# Patient Record
Sex: Female | Born: 1935 | Race: White | Hispanic: No | Marital: Married | Smoking: Former smoker
Health system: Southern US, Community
[De-identification: ages and names within clinical notes are randomized; demographics above are authoritative.]

## PROBLEM LIST (undated history)

## (undated) DIAGNOSIS — I10 Essential (primary) hypertension: Secondary | ICD-10-CM

## (undated) DIAGNOSIS — H269 Unspecified cataract: Secondary | ICD-10-CM

## (undated) DIAGNOSIS — I4891 Unspecified atrial fibrillation: Secondary | ICD-10-CM

## (undated) DIAGNOSIS — Z8543 Personal history of malignant neoplasm of ovary: Secondary | ICD-10-CM

## (undated) DIAGNOSIS — E079 Disorder of thyroid, unspecified: Secondary | ICD-10-CM

## (undated) DIAGNOSIS — K56600 Partial intestinal obstruction, unspecified as to cause: Secondary | ICD-10-CM

## (undated) DIAGNOSIS — I509 Heart failure, unspecified: Secondary | ICD-10-CM

## (undated) DIAGNOSIS — I2510 Atherosclerotic heart disease of native coronary artery without angina pectoris: Secondary | ICD-10-CM

## (undated) DIAGNOSIS — Z95810 Presence of automatic (implantable) cardiac defibrillator: Secondary | ICD-10-CM

## (undated) DIAGNOSIS — K635 Polyp of colon: Secondary | ICD-10-CM

## (undated) DIAGNOSIS — I493 Ventricular premature depolarization: Secondary | ICD-10-CM

## (undated) DIAGNOSIS — M1990 Unspecified osteoarthritis, unspecified site: Secondary | ICD-10-CM

## (undated) DIAGNOSIS — M810 Age-related osteoporosis without current pathological fracture: Secondary | ICD-10-CM

## (undated) DIAGNOSIS — K648 Other hemorrhoids: Secondary | ICD-10-CM

## (undated) DIAGNOSIS — Z87440 Personal history of urinary (tract) infections: Secondary | ICD-10-CM

## (undated) DIAGNOSIS — K219 Gastro-esophageal reflux disease without esophagitis: Secondary | ICD-10-CM

## (undated) DIAGNOSIS — E785 Hyperlipidemia, unspecified: Secondary | ICD-10-CM

## (undated) DIAGNOSIS — C189 Malignant neoplasm of colon, unspecified: Secondary | ICD-10-CM

## (undated) DIAGNOSIS — E119 Type 2 diabetes mellitus without complications: Secondary | ICD-10-CM

## (undated) DIAGNOSIS — I428 Other cardiomyopathies: Secondary | ICD-10-CM

## (undated) HISTORY — DX: Personal history of urinary (tract) infections: Z87.440

## (undated) HISTORY — DX: Ventricular premature depolarization: I49.3

## (undated) HISTORY — DX: Disorder of thyroid, unspecified: E07.9

## (undated) HISTORY — DX: Partial intestinal obstruction, unspecified as to cause: K56.600

## (undated) HISTORY — DX: Type 2 diabetes mellitus without complications: E11.9

## (undated) HISTORY — DX: Hyperlipidemia, unspecified: E78.5

## (undated) HISTORY — DX: Age-related osteoporosis without current pathological fracture: M81.0

## (undated) HISTORY — DX: Unspecified cataract: H26.9

## (undated) HISTORY — PX: FRACTURE SURGERY: SHX138

## (undated) HISTORY — DX: Personal history of malignant neoplasm of ovary: Z85.43

## (undated) HISTORY — DX: Other cardiomyopathies: I42.8

## (undated) HISTORY — DX: Unspecified atrial fibrillation: I48.91

## (undated) HISTORY — DX: Other hemorrhoids: K64.8

## (undated) HISTORY — DX: Atherosclerotic heart disease of native coronary artery without angina pectoris: I25.10

## (undated) HISTORY — DX: Polyp of colon: K63.5

## (undated) HISTORY — DX: Essential (primary) hypertension: I10

---
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
## 1959-04-19 HISTORY — PX: OOPHORECTOMY: SHX86

## 1977-04-18 HISTORY — PX: ABDOMINAL HYSTERECTOMY: SHX81

## 1978-04-18 HISTORY — PX: LAPAROTOMY: SHX154

## 1983-04-19 DIAGNOSIS — Z8543 Personal history of malignant neoplasm of ovary: Secondary | ICD-10-CM

## 1983-04-19 HISTORY — DX: Personal history of malignant neoplasm of ovary: Z85.43

## 1983-04-19 HISTORY — PX: OVARY SURGERY: SHX727

## 1983-04-19 HISTORY — PX: SMALL INTESTINE SURGERY: SHX150

## 1987-04-19 HISTORY — PX: LAPAROTOMY: SHX154

## 1999-06-28 ENCOUNTER — Encounter: Admission: RE | Admit: 1999-06-28 | Discharge: 1999-07-15 | Payer: Self-pay | Admitting: Internal Medicine

## 2000-03-02 ENCOUNTER — Ambulatory Visit (HOSPITAL_COMMUNITY): Admission: RE | Admit: 2000-03-02 | Discharge: 2000-03-03 | Payer: Self-pay | Admitting: Cardiology

## 2000-04-25 ENCOUNTER — Encounter: Admission: RE | Admit: 2000-04-25 | Discharge: 2000-04-25 | Payer: Self-pay | Admitting: General Surgery

## 2000-04-25 ENCOUNTER — Ambulatory Visit (HOSPITAL_BASED_OUTPATIENT_CLINIC_OR_DEPARTMENT_OTHER): Admission: RE | Admit: 2000-04-25 | Discharge: 2000-04-25 | Payer: Self-pay | Admitting: General Surgery

## 2000-04-25 ENCOUNTER — Encounter: Payer: Self-pay | Admitting: General Surgery

## 2000-05-24 ENCOUNTER — Encounter: Payer: Self-pay | Admitting: Gastroenterology

## 2000-05-24 ENCOUNTER — Encounter (INDEPENDENT_AMBULATORY_CARE_PROVIDER_SITE_OTHER): Payer: Self-pay | Admitting: Specialist

## 2000-05-24 ENCOUNTER — Other Ambulatory Visit: Admission: RE | Admit: 2000-05-24 | Discharge: 2000-05-24 | Payer: Self-pay | Admitting: Gastroenterology

## 2000-11-16 ENCOUNTER — Encounter: Payer: Self-pay | Admitting: Gastroenterology

## 2000-11-16 ENCOUNTER — Encounter (INDEPENDENT_AMBULATORY_CARE_PROVIDER_SITE_OTHER): Payer: Self-pay | Admitting: Specialist

## 2000-11-16 ENCOUNTER — Other Ambulatory Visit: Admission: RE | Admit: 2000-11-16 | Discharge: 2000-11-16 | Payer: Self-pay | Admitting: Gastroenterology

## 2004-05-05 ENCOUNTER — Other Ambulatory Visit: Admission: RE | Admit: 2004-05-05 | Discharge: 2004-05-05 | Payer: Self-pay | Admitting: Internal Medicine

## 2005-04-18 HISTORY — PX: COLECTOMY: SHX59

## 2006-02-15 ENCOUNTER — Ambulatory Visit: Payer: Self-pay | Admitting: Gastroenterology

## 2006-03-08 ENCOUNTER — Ambulatory Visit: Payer: Self-pay | Admitting: Gastroenterology

## 2006-03-18 DIAGNOSIS — C189 Malignant neoplasm of colon, unspecified: Secondary | ICD-10-CM

## 2006-03-18 HISTORY — DX: Malignant neoplasm of colon, unspecified: C18.9

## 2006-03-22 ENCOUNTER — Encounter (INDEPENDENT_AMBULATORY_CARE_PROVIDER_SITE_OTHER): Payer: Self-pay | Admitting: Specialist

## 2006-03-22 ENCOUNTER — Ambulatory Visit: Payer: Self-pay | Admitting: Gastroenterology

## 2006-04-04 ENCOUNTER — Encounter: Admission: RE | Admit: 2006-04-04 | Discharge: 2006-04-04 | Payer: Self-pay | Admitting: General Surgery

## 2006-04-13 ENCOUNTER — Inpatient Hospital Stay (HOSPITAL_COMMUNITY): Admission: RE | Admit: 2006-04-13 | Discharge: 2006-04-18 | Payer: Self-pay | Admitting: General Surgery

## 2006-04-13 ENCOUNTER — Encounter (INDEPENDENT_AMBULATORY_CARE_PROVIDER_SITE_OTHER): Payer: Self-pay | Admitting: *Deleted

## 2006-04-13 ENCOUNTER — Encounter (INDEPENDENT_AMBULATORY_CARE_PROVIDER_SITE_OTHER): Payer: Self-pay | Admitting: Specialist

## 2006-04-17 ENCOUNTER — Ambulatory Visit: Payer: Self-pay | Admitting: Hematology and Oncology

## 2006-04-27 LAB — COMPREHENSIVE METABOLIC PANEL
ALT: 33 U/L (ref 0–35)
AST: 23 U/L (ref 0–37)
Albumin: 4.2 g/dL (ref 3.5–5.2)
Alkaline Phosphatase: 97 U/L (ref 39–117)
BUN: 11 mg/dL (ref 6–23)
CO2: 25 mEq/L (ref 19–32)
Calcium: 9.4 mg/dL (ref 8.4–10.5)
Chloride: 102 mEq/L (ref 96–112)
Creatinine, Ser: 0.84 mg/dL (ref 0.40–1.20)
Glucose, Bld: 93 mg/dL (ref 70–99)
Potassium: 4.1 mEq/L (ref 3.5–5.3)
Sodium: 141 mEq/L (ref 135–145)
Total Bilirubin: 0.5 mg/dL (ref 0.3–1.2)
Total Protein: 6.9 g/dL (ref 6.0–8.3)

## 2006-04-27 LAB — CBC WITH DIFFERENTIAL/PLATELET
BASO%: 0.7 % (ref 0.0–2.0)
Basophils Absolute: 0 10*3/uL (ref 0.0–0.1)
EOS%: 2.8 % (ref 0.0–7.0)
Eosinophils Absolute: 0.2 10*3/uL (ref 0.0–0.5)
HCT: 36 % (ref 34.8–46.6)
HGB: 12.3 g/dL (ref 11.6–15.9)
LYMPH%: 31.3 % (ref 14.0–48.0)
MCH: 30.3 pg (ref 26.0–34.0)
MCHC: 34.1 g/dL (ref 32.0–36.0)
MCV: 89 fL (ref 81.0–101.0)
MONO#: 0.5 10*3/uL (ref 0.1–0.9)
MONO%: 9 % (ref 0.0–13.0)
NEUT#: 3.1 10*3/uL (ref 1.5–6.5)
NEUT%: 56.2 % (ref 39.6–76.8)
Platelets: 343 10*3/uL (ref 145–400)
RBC: 4.04 10*6/uL (ref 3.70–5.32)
RDW: 12.6 % (ref 11.3–14.5)
WBC: 5.4 10*3/uL (ref 3.9–10.0)
lymph#: 1.7 10*3/uL (ref 0.9–3.3)

## 2006-04-27 LAB — CEA: CEA: 0.9 ng/mL (ref 0.0–5.0)

## 2006-05-01 ENCOUNTER — Ambulatory Visit (HOSPITAL_COMMUNITY): Admission: RE | Admit: 2006-05-01 | Discharge: 2006-05-01 | Payer: Self-pay | Admitting: Hematology and Oncology

## 2006-05-04 LAB — HM MAMMOGRAPHY

## 2006-05-09 ENCOUNTER — Ambulatory Visit: Payer: Self-pay | Admitting: Internal Medicine

## 2006-05-26 ENCOUNTER — Encounter (INDEPENDENT_AMBULATORY_CARE_PROVIDER_SITE_OTHER): Payer: Self-pay | Admitting: *Deleted

## 2006-06-21 ENCOUNTER — Ambulatory Visit: Payer: Self-pay | Admitting: Internal Medicine

## 2006-07-24 ENCOUNTER — Ambulatory Visit: Payer: Self-pay | Admitting: Hematology and Oncology

## 2006-07-27 LAB — CEA: CEA: 0.9 ng/mL (ref 0.0–5.0)

## 2006-07-27 LAB — CBC WITH DIFFERENTIAL/PLATELET
BASO%: 0.7 % (ref 0.0–2.0)
Basophils Absolute: 0 10*3/uL (ref 0.0–0.1)
EOS%: 3 % (ref 0.0–7.0)
Eosinophils Absolute: 0.2 10*3/uL (ref 0.0–0.5)
HCT: 37.5 % (ref 34.8–46.6)
HGB: 13.1 g/dL (ref 11.6–15.9)
LYMPH%: 34.2 % (ref 14.0–48.0)
MCH: 30.5 pg (ref 26.0–34.0)
MCHC: 35 g/dL (ref 32.0–36.0)
MCV: 87 fL (ref 81.0–101.0)
MONO#: 0.5 10*3/uL (ref 0.1–0.9)
MONO%: 8.9 % (ref 0.0–13.0)
NEUT#: 2.8 10*3/uL (ref 1.5–6.5)
NEUT%: 53.2 % (ref 39.6–76.8)
Platelets: 187 10*3/uL (ref 145–400)
RBC: 4.31 10*6/uL (ref 3.70–5.32)
RDW: 14 % (ref 11.3–14.5)
WBC: 5.3 10*3/uL (ref 3.9–10.0)
lymph#: 1.8 10*3/uL (ref 0.9–3.3)

## 2006-07-27 LAB — COMPREHENSIVE METABOLIC PANEL
ALT: 25 U/L (ref 0–35)
AST: 26 U/L (ref 0–37)
Albumin: 4.3 g/dL (ref 3.5–5.2)
Alkaline Phosphatase: 59 U/L (ref 39–117)
BUN: 15 mg/dL (ref 6–23)
CO2: 25 mEq/L (ref 19–32)
Calcium: 9.5 mg/dL (ref 8.4–10.5)
Chloride: 99 mEq/L (ref 96–112)
Creatinine, Ser: 0.88 mg/dL (ref 0.40–1.20)
Glucose, Bld: 97 mg/dL (ref 70–99)
Potassium: 4 mEq/L (ref 3.5–5.3)
Sodium: 136 mEq/L (ref 135–145)
Total Bilirubin: 0.6 mg/dL (ref 0.3–1.2)
Total Protein: 7.2 g/dL (ref 6.0–8.3)

## 2006-07-27 LAB — CA 125: CA 125: 6.9 U/mL (ref 0.0–30.2)

## 2006-11-20 ENCOUNTER — Ambulatory Visit: Payer: Self-pay | Admitting: Hematology and Oncology

## 2006-11-22 LAB — COMPREHENSIVE METABOLIC PANEL
ALT: 32 U/L (ref 0–35)
AST: 32 U/L (ref 0–37)
Albumin: 4.6 g/dL (ref 3.5–5.2)
Alkaline Phosphatase: 60 U/L (ref 39–117)
BUN: 15 mg/dL (ref 6–23)
CO2: 27 mEq/L (ref 19–32)
Calcium: 9.3 mg/dL (ref 8.4–10.5)
Chloride: 99 mEq/L (ref 96–112)
Creatinine, Ser: 0.99 mg/dL (ref 0.40–1.20)
Glucose, Bld: 137 mg/dL — ABNORMAL HIGH (ref 70–99)
Potassium: 3.7 mEq/L (ref 3.5–5.3)
Sodium: 138 mEq/L (ref 135–145)
Total Bilirubin: 0.6 mg/dL (ref 0.3–1.2)
Total Protein: 7.4 g/dL (ref 6.0–8.3)

## 2006-11-22 LAB — CBC WITH DIFFERENTIAL/PLATELET
BASO%: 0.3 % (ref 0.0–2.0)
Basophils Absolute: 0 10*3/uL (ref 0.0–0.1)
EOS%: 1.7 % (ref 0.0–7.0)
Eosinophils Absolute: 0.1 10*3/uL (ref 0.0–0.5)
HCT: 37.7 % (ref 34.8–46.6)
HGB: 13.5 g/dL (ref 11.6–15.9)
LYMPH%: 31 % (ref 14.0–48.0)
MCH: 31.8 pg (ref 26.0–34.0)
MCHC: 35.9 g/dL (ref 32.0–36.0)
MCV: 88.7 fL (ref 81.0–101.0)
MONO#: 0.4 10*3/uL (ref 0.1–0.9)
MONO%: 6.1 % (ref 0.0–13.0)
NEUT#: 4.2 10*3/uL (ref 1.5–6.5)
NEUT%: 60.9 % (ref 39.6–76.8)
Platelets: 196 10*3/uL (ref 145–400)
RBC: 4.25 10*6/uL (ref 3.70–5.32)
RDW: 12.6 % (ref 11.3–14.5)
WBC: 7 10*3/uL (ref 3.9–10.0)
lymph#: 2.2 10*3/uL (ref 0.9–3.3)

## 2006-11-22 LAB — CA 125: CA 125: 5.9 U/mL (ref 0.0–30.2)

## 2006-11-22 LAB — CEA: CEA: 0.5 ng/mL (ref 0.0–5.0)

## 2006-11-24 ENCOUNTER — Ambulatory Visit (HOSPITAL_COMMUNITY): Admission: RE | Admit: 2006-11-24 | Discharge: 2006-11-24 | Payer: Self-pay | Admitting: Hematology and Oncology

## 2007-02-28 ENCOUNTER — Ambulatory Visit: Payer: Self-pay | Admitting: Gastroenterology

## 2007-03-13 ENCOUNTER — Ambulatory Visit: Payer: Self-pay | Admitting: Gastroenterology

## 2007-03-13 ENCOUNTER — Encounter: Payer: Self-pay | Admitting: Internal Medicine

## 2007-03-21 ENCOUNTER — Ambulatory Visit: Payer: Self-pay | Admitting: Hematology and Oncology

## 2007-03-23 LAB — COMPREHENSIVE METABOLIC PANEL
ALT: 19 U/L (ref 0–35)
AST: 26 U/L (ref 0–37)
Albumin: 4.6 g/dL (ref 3.5–5.2)
Alkaline Phosphatase: 52 U/L (ref 39–117)
BUN: 14 mg/dL (ref 6–23)
CO2: 25 mEq/L (ref 19–32)
Calcium: 9.4 mg/dL (ref 8.4–10.5)
Chloride: 102 mEq/L (ref 96–112)
Creatinine, Ser: 0.88 mg/dL (ref 0.40–1.20)
Glucose, Bld: 100 mg/dL — ABNORMAL HIGH (ref 70–99)
Potassium: 4.4 mEq/L (ref 3.5–5.3)
Sodium: 140 mEq/L (ref 135–145)
Total Bilirubin: 0.7 mg/dL (ref 0.3–1.2)
Total Protein: 7.6 g/dL (ref 6.0–8.3)

## 2007-03-23 LAB — CBC WITH DIFFERENTIAL/PLATELET
BASO%: 0.5 % (ref 0.0–2.0)
Basophils Absolute: 0 10*3/uL (ref 0.0–0.1)
EOS%: 2.7 % (ref 0.0–7.0)
Eosinophils Absolute: 0.1 10*3/uL (ref 0.0–0.5)
HCT: 38.2 % (ref 34.8–46.6)
HGB: 13.3 g/dL (ref 11.6–15.9)
LYMPH%: 39.7 % (ref 14.0–48.0)
MCH: 31.4 pg (ref 26.0–34.0)
MCHC: 34.8 g/dL (ref 32.0–36.0)
MCV: 90.1 fL (ref 81.0–101.0)
MONO#: 0.5 10*3/uL (ref 0.1–0.9)
MONO%: 8.6 % (ref 0.0–13.0)
NEUT#: 2.6 10*3/uL (ref 1.5–6.5)
NEUT%: 48.5 % (ref 39.6–76.8)
Platelets: 209 10*3/uL (ref 145–400)
RBC: 4.24 10*6/uL (ref 3.70–5.32)
RDW: 13.1 % (ref 11.3–14.5)
WBC: 5.4 10*3/uL (ref 3.9–10.0)
lymph#: 2.1 10*3/uL (ref 0.9–3.3)

## 2007-03-23 LAB — CEA: CEA: 1 ng/mL (ref 0.0–5.0)

## 2007-03-23 LAB — CA 125: CA 125: 6.8 U/mL (ref 0.0–30.2)

## 2007-03-30 ENCOUNTER — Encounter: Payer: Self-pay | Admitting: Internal Medicine

## 2007-04-27 ENCOUNTER — Encounter: Payer: Self-pay | Admitting: Internal Medicine

## 2007-04-27 ENCOUNTER — Ambulatory Visit: Payer: Self-pay | Admitting: Internal Medicine

## 2007-04-27 DIAGNOSIS — N390 Urinary tract infection, site not specified: Secondary | ICD-10-CM | POA: Insufficient documentation

## 2007-04-27 DIAGNOSIS — I10 Essential (primary) hypertension: Secondary | ICD-10-CM | POA: Insufficient documentation

## 2007-04-27 DIAGNOSIS — Z85038 Personal history of other malignant neoplasm of large intestine: Secondary | ICD-10-CM | POA: Insufficient documentation

## 2007-04-27 DIAGNOSIS — E041 Nontoxic single thyroid nodule: Secondary | ICD-10-CM | POA: Insufficient documentation

## 2007-04-27 LAB — CONVERTED CEMR LAB
Free T4: 0.7 ng/dL (ref 0.6–1.6)
TSH: 2.98 microintl units/mL (ref 0.35–5.50)

## 2007-04-29 ENCOUNTER — Encounter: Payer: Self-pay | Admitting: Internal Medicine

## 2007-05-09 ENCOUNTER — Encounter: Payer: Self-pay | Admitting: Internal Medicine

## 2007-07-24 ENCOUNTER — Ambulatory Visit: Payer: Self-pay | Admitting: Hematology and Oncology

## 2007-07-26 LAB — COMPREHENSIVE METABOLIC PANEL
ALT: 26 U/L (ref 0–35)
AST: 24 U/L (ref 0–37)
Albumin: 4.6 g/dL (ref 3.5–5.2)
Alkaline Phosphatase: 51 U/L (ref 39–117)
BUN: 15 mg/dL (ref 6–23)
CO2: 26 mEq/L (ref 19–32)
Calcium: 9 mg/dL (ref 8.4–10.5)
Chloride: 99 mEq/L (ref 96–112)
Creatinine, Ser: 0.91 mg/dL (ref 0.40–1.20)
Glucose, Bld: 95 mg/dL (ref 70–99)
Potassium: 3.9 mEq/L (ref 3.5–5.3)
Sodium: 139 mEq/L (ref 135–145)
Total Bilirubin: 0.6 mg/dL (ref 0.3–1.2)
Total Protein: 7.1 g/dL (ref 6.0–8.3)

## 2007-07-26 LAB — CBC WITH DIFFERENTIAL/PLATELET
BASO%: 0.6 % (ref 0.0–2.0)
Basophils Absolute: 0 10*3/uL (ref 0.0–0.1)
EOS%: 2.7 % (ref 0.0–7.0)
Eosinophils Absolute: 0.2 10*3/uL (ref 0.0–0.5)
HCT: 40.8 % (ref 34.8–46.6)
HGB: 14 g/dL (ref 11.6–15.9)
LYMPH%: 40.1 % (ref 14.0–48.0)
MCH: 31 pg (ref 26.0–34.0)
MCHC: 34.3 g/dL (ref 32.0–36.0)
MCV: 90.3 fL (ref 81.0–101.0)
MONO#: 0.5 10*3/uL (ref 0.1–0.9)
MONO%: 8.9 % (ref 0.0–13.0)
NEUT#: 2.9 10*3/uL (ref 1.5–6.5)
NEUT%: 47.7 % (ref 39.6–76.8)
Platelets: 200 10*3/uL (ref 145–400)
RBC: 4.52 10*6/uL (ref 3.70–5.32)
RDW: 12.8 % (ref 11.3–14.5)
WBC: 6.2 10*3/uL (ref 3.9–10.0)
lymph#: 2.5 10*3/uL (ref 0.9–3.3)

## 2007-07-26 LAB — CEA: CEA: <0.5 ng/mL (ref 0.0–5.0)

## 2007-07-30 ENCOUNTER — Encounter (INDEPENDENT_AMBULATORY_CARE_PROVIDER_SITE_OTHER): Payer: Self-pay | Admitting: *Deleted

## 2007-07-30 ENCOUNTER — Ambulatory Visit (HOSPITAL_COMMUNITY): Admission: RE | Admit: 2007-07-30 | Discharge: 2007-07-30 | Payer: Self-pay | Admitting: Hematology and Oncology

## 2007-07-31 ENCOUNTER — Encounter: Payer: Self-pay | Admitting: Internal Medicine

## 2007-08-02 ENCOUNTER — Encounter: Payer: Self-pay | Admitting: Internal Medicine

## 2007-08-07 ENCOUNTER — Telehealth: Payer: Self-pay | Admitting: Internal Medicine

## 2007-09-17 ENCOUNTER — Ambulatory Visit: Payer: Self-pay | Admitting: Internal Medicine

## 2007-09-17 DIAGNOSIS — N951 Menopausal and female climacteric states: Secondary | ICD-10-CM | POA: Insufficient documentation

## 2007-09-24 ENCOUNTER — Encounter: Payer: Self-pay | Admitting: Internal Medicine

## 2007-09-24 ENCOUNTER — Ambulatory Visit: Payer: Self-pay | Admitting: Family Medicine

## 2007-10-15 ENCOUNTER — Encounter: Payer: Self-pay | Admitting: Internal Medicine

## 2007-11-27 ENCOUNTER — Ambulatory Visit: Payer: Self-pay | Admitting: Hematology and Oncology

## 2007-11-30 ENCOUNTER — Encounter: Payer: Self-pay | Admitting: Internal Medicine

## 2007-11-30 LAB — CBC WITH DIFFERENTIAL/PLATELET
BASO%: 0.4 % (ref 0.0–2.0)
Basophils Absolute: 0 10*3/uL (ref 0.0–0.1)
EOS%: 1.4 % (ref 0.0–7.0)
Eosinophils Absolute: 0.1 10*3/uL (ref 0.0–0.5)
HCT: 39.1 % (ref 34.8–46.6)
HGB: 13.3 g/dL (ref 11.6–15.9)
LYMPH%: 15 % (ref 14.0–48.0)
MCH: 31.2 pg (ref 26.0–34.0)
MCHC: 34.1 g/dL (ref 32.0–36.0)
MCV: 91.5 fL (ref 81.0–101.0)
MONO#: 0.6 10*3/uL (ref 0.1–0.9)
MONO%: 5.7 % (ref 0.0–13.0)
NEUT#: 7.6 10*3/uL — ABNORMAL HIGH (ref 1.5–6.5)
NEUT%: 77.5 % — ABNORMAL HIGH (ref 39.6–76.8)
Platelets: 185 10*3/uL (ref 145–400)
RBC: 4.28 10*6/uL (ref 3.70–5.32)
RDW: 13.1 % (ref 11.3–14.5)
WBC: 9.8 10*3/uL (ref 3.9–10.0)
lymph#: 1.5 10*3/uL (ref 0.9–3.3)

## 2007-12-01 LAB — COMPREHENSIVE METABOLIC PANEL
ALT: 33 U/L (ref 0–35)
AST: 39 U/L — ABNORMAL HIGH (ref 0–37)
Albumin: 4.5 g/dL (ref 3.5–5.2)
Alkaline Phosphatase: 63 U/L (ref 39–117)
BUN: 14 mg/dL (ref 6–23)
CO2: 24 mEq/L (ref 19–32)
Calcium: 9.2 mg/dL (ref 8.4–10.5)
Chloride: 98 mEq/L (ref 96–112)
Creatinine, Ser: 0.95 mg/dL (ref 0.40–1.20)
Glucose, Bld: 151 mg/dL — ABNORMAL HIGH (ref 70–99)
Potassium: 3.3 mEq/L — ABNORMAL LOW (ref 3.5–5.3)
Sodium: 139 mEq/L (ref 135–145)
Total Bilirubin: 0.5 mg/dL (ref 0.3–1.2)
Total Protein: 7.4 g/dL (ref 6.0–8.3)

## 2007-12-01 LAB — LACTATE DEHYDROGENASE: LDH: 155 U/L (ref 94–250)

## 2007-12-01 LAB — CA 125: CA 125: 5.7 U/mL (ref 0.0–30.2)

## 2007-12-01 LAB — CEA: CEA: 1.2 ng/mL (ref 0.0–5.0)

## 2007-12-04 ENCOUNTER — Encounter: Payer: Self-pay | Admitting: Internal Medicine

## 2007-12-05 ENCOUNTER — Encounter: Payer: Self-pay | Admitting: Gastroenterology

## 2008-01-29 ENCOUNTER — Encounter: Payer: Self-pay | Admitting: Internal Medicine

## 2008-05-12 ENCOUNTER — Encounter: Payer: Self-pay | Admitting: Internal Medicine

## 2008-05-13 ENCOUNTER — Ambulatory Visit: Payer: Self-pay | Admitting: Hematology and Oncology

## 2008-05-15 ENCOUNTER — Ambulatory Visit (HOSPITAL_COMMUNITY): Admission: RE | Admit: 2008-05-15 | Discharge: 2008-05-15 | Payer: Self-pay | Admitting: Hematology and Oncology

## 2008-05-15 ENCOUNTER — Encounter: Payer: Self-pay | Admitting: Gastroenterology

## 2008-05-15 LAB — CBC WITH DIFFERENTIAL/PLATELET
BASO%: 0.5 % (ref 0.0–2.0)
Basophils Absolute: 0 10*3/uL (ref 0.0–0.1)
EOS%: 3 % (ref 0.0–7.0)
Eosinophils Absolute: 0.2 10*3/uL (ref 0.0–0.5)
HCT: 37.7 % (ref 34.8–46.6)
HGB: 13 g/dL (ref 11.6–15.9)
LYMPH%: 26.6 % (ref 14.0–48.0)
MCH: 31.3 pg (ref 26.0–34.0)
MCHC: 34.5 g/dL (ref 32.0–36.0)
MCV: 90.8 fL (ref 81.0–101.0)
MONO#: 0.5 10*3/uL (ref 0.1–0.9)
MONO%: 7.3 % (ref 0.0–13.0)
NEUT#: 4.4 10*3/uL (ref 1.5–6.5)
NEUT%: 62.6 % (ref 39.6–76.8)
Platelets: 252 10*3/uL (ref 145–400)
RBC: 4.15 10*6/uL (ref 3.70–5.32)
RDW: 12.3 % (ref 11.3–14.5)
WBC: 7 10*3/uL (ref 3.9–10.0)
lymph#: 1.9 10*3/uL (ref 0.9–3.3)

## 2008-05-15 LAB — COMPREHENSIVE METABOLIC PANEL
ALT: 37 U/L — ABNORMAL HIGH (ref 0–35)
AST: 42 U/L — ABNORMAL HIGH (ref 0–37)
Albumin: 3.7 g/dL (ref 3.5–5.2)
Alkaline Phosphatase: 81 U/L (ref 39–117)
BUN: 13 mg/dL (ref 6–23)
CO2: 29 mEq/L (ref 19–32)
Calcium: 8.7 mg/dL (ref 8.4–10.5)
Chloride: 94 mEq/L — ABNORMAL LOW (ref 96–112)
Creatinine, Ser: 0.89 mg/dL (ref 0.40–1.20)
Glucose, Bld: 101 mg/dL — ABNORMAL HIGH (ref 70–99)
Potassium: 3.4 mEq/L — ABNORMAL LOW (ref 3.5–5.3)
Sodium: 133 mEq/L — ABNORMAL LOW (ref 135–145)
Total Bilirubin: 0.8 mg/dL (ref 0.3–1.2)
Total Protein: 7.3 g/dL (ref 6.0–8.3)

## 2008-05-15 LAB — CEA: CEA: 1 ng/mL (ref 0.0–5.0)

## 2008-05-16 LAB — CA 125: CA 125: 5.6 U/mL (ref 0.0–30.2)

## 2008-05-22 ENCOUNTER — Encounter: Payer: Self-pay | Admitting: Gastroenterology

## 2008-08-25 ENCOUNTER — Telehealth: Payer: Self-pay | Admitting: Gastroenterology

## 2008-08-26 ENCOUNTER — Ambulatory Visit: Payer: Self-pay | Admitting: Gastroenterology

## 2008-08-26 DIAGNOSIS — K648 Other hemorrhoids: Secondary | ICD-10-CM | POA: Insufficient documentation

## 2008-08-26 DIAGNOSIS — R197 Diarrhea, unspecified: Secondary | ICD-10-CM | POA: Insufficient documentation

## 2008-08-26 DIAGNOSIS — C189 Malignant neoplasm of colon, unspecified: Secondary | ICD-10-CM | POA: Insufficient documentation

## 2008-08-27 ENCOUNTER — Encounter: Payer: Self-pay | Admitting: Physician Assistant

## 2008-08-27 LAB — CONVERTED CEMR LAB
BUN: 9 mg/dL (ref 6–23)
Basophils Absolute: 0 10*3/uL (ref 0.0–0.1)
Basophils Relative: 0 % (ref 0.0–3.0)
CO2: 30 meq/L (ref 19–32)
Calcium: 8.8 mg/dL (ref 8.4–10.5)
Chloride: 100 meq/L (ref 96–112)
Creatinine, Ser: 0.7 mg/dL (ref 0.4–1.2)
Eosinophils Absolute: 0.1 10*3/uL (ref 0.0–0.7)
Eosinophils Relative: 1.1 % (ref 0.0–5.0)
GFR calc non Af Amer: 87.76 mL/min (ref >60)
Glucose, Bld: 93 mg/dL (ref 70–99)
HCT: 34.9 % — ABNORMAL LOW (ref 36.0–46.0)
Hemoglobin: 12.1 g/dL (ref 12.0–15.0)
Lymphocytes Relative: 13.4 % (ref 12.0–46.0)
Lymphs Abs: 1.3 10*3/uL (ref 0.7–4.0)
MCHC: 34.7 g/dL (ref 30.0–36.0)
MCV: 90.5 fL (ref 78.0–100.0)
Monocytes Absolute: 1 10*3/uL (ref 0.1–1.0)
Monocytes Relative: 10.1 % (ref 3.0–12.0)
Neutro Abs: 7.4 10*3/uL (ref 1.4–7.7)
Neutrophils Relative %: 75.4 % (ref 43.0–77.0)
Platelets: 218 10*3/uL (ref 150.0–400.0)
Potassium: 3.2 meq/L — ABNORMAL LOW (ref 3.5–5.1)
RBC: 3.85 M/uL — ABNORMAL LOW (ref 3.87–5.11)
RDW: 12.7 % (ref 11.5–14.6)
Sodium: 140 meq/L (ref 135–145)
WBC: 9.8 10*3/uL (ref 4.5–10.5)

## 2008-09-01 ENCOUNTER — Ambulatory Visit: Payer: Self-pay | Admitting: Physician Assistant

## 2008-09-01 LAB — CONVERTED CEMR LAB
Magnesium: 1.1 mg/dL — ABNORMAL LOW (ref 1.5–2.5)
Phosphorus: 2.6 mg/dL (ref 2.3–4.6)
Potassium: 2.4 meq/L — CL (ref 3.5–5.1)

## 2008-09-04 ENCOUNTER — Ambulatory Visit: Payer: Self-pay | Admitting: Physician Assistant

## 2008-09-10 ENCOUNTER — Telehealth: Payer: Self-pay | Admitting: Physician Assistant

## 2008-09-10 LAB — CONVERTED CEMR LAB
Magnesium: 1.3 mg/dL — ABNORMAL LOW (ref 1.5–2.5)
Potassium: 3.5 meq/L (ref 3.5–5.1)

## 2008-09-12 ENCOUNTER — Ambulatory Visit: Payer: Self-pay | Admitting: Gastroenterology

## 2008-09-12 ENCOUNTER — Ambulatory Visit: Payer: Self-pay | Admitting: Physician Assistant

## 2008-09-12 DIAGNOSIS — A09 Infectious gastroenteritis and colitis, unspecified: Secondary | ICD-10-CM | POA: Insufficient documentation

## 2008-09-12 DIAGNOSIS — C182 Malignant neoplasm of ascending colon: Secondary | ICD-10-CM | POA: Insufficient documentation

## 2008-09-12 DIAGNOSIS — R197 Diarrhea, unspecified: Secondary | ICD-10-CM | POA: Insufficient documentation

## 2008-09-12 LAB — CONVERTED CEMR LAB
Magnesium: 1.4 mg/dL — ABNORMAL LOW (ref 1.5–2.5)
Potassium: 3.8 meq/L (ref 3.5–5.1)

## 2008-10-15 ENCOUNTER — Ambulatory Visit: Payer: Self-pay | Admitting: Gastroenterology

## 2008-10-15 ENCOUNTER — Encounter: Payer: Self-pay | Admitting: Gastroenterology

## 2008-10-15 LAB — HM COLONOSCOPY

## 2008-10-17 ENCOUNTER — Encounter (INDEPENDENT_AMBULATORY_CARE_PROVIDER_SITE_OTHER): Payer: Self-pay

## 2008-10-21 ENCOUNTER — Encounter: Payer: Self-pay | Admitting: Gastroenterology

## 2008-11-11 ENCOUNTER — Telehealth: Payer: Self-pay | Admitting: Internal Medicine

## 2008-11-26 ENCOUNTER — Ambulatory Visit: Payer: Self-pay | Admitting: Gastroenterology

## 2008-11-26 DIAGNOSIS — K5289 Other specified noninfective gastroenteritis and colitis: Secondary | ICD-10-CM | POA: Insufficient documentation

## 2009-02-12 ENCOUNTER — Ambulatory Visit: Payer: Self-pay | Admitting: Hematology and Oncology

## 2009-02-17 ENCOUNTER — Ambulatory Visit (HOSPITAL_COMMUNITY): Admission: RE | Admit: 2009-02-17 | Discharge: 2009-02-17 | Payer: Self-pay | Admitting: Hematology and Oncology

## 2009-02-17 LAB — CA 125: CA 125: 7.7 U/mL (ref 0.0–30.2)

## 2009-02-17 LAB — CBC WITH DIFFERENTIAL/PLATELET
BASO%: 0.5 % (ref 0.0–2.0)
Basophils Absolute: 0 10*3/uL (ref 0.0–0.1)
EOS%: 3.5 % (ref 0.0–7.0)
Eosinophils Absolute: 0.2 10*3/uL (ref 0.0–0.5)
HCT: 39.6 % (ref 34.8–46.6)
HGB: 13.3 g/dL (ref 11.6–15.9)
LYMPH%: 30.9 % (ref 14.0–49.7)
MCH: 31.1 pg (ref 25.1–34.0)
MCHC: 33.6 g/dL (ref 31.5–36.0)
MCV: 92.7 fL (ref 79.5–101.0)
MONO#: 0.6 10*3/uL (ref 0.1–0.9)
MONO%: 10.3 % (ref 0.0–14.0)
NEUT#: 3.1 10*3/uL (ref 1.5–6.5)
NEUT%: 54.8 % (ref 38.4–76.8)
Platelets: 198 10*3/uL (ref 145–400)
RBC: 4.28 10*6/uL (ref 3.70–5.45)
RDW: 13.4 % (ref 11.2–14.5)
WBC: 5.7 10*3/uL (ref 3.9–10.3)
lymph#: 1.8 10*3/uL (ref 0.9–3.3)

## 2009-02-17 LAB — COMPREHENSIVE METABOLIC PANEL
ALT: 25 U/L (ref 0–35)
AST: 36 U/L (ref 0–37)
Albumin: 4.1 g/dL (ref 3.5–5.2)
Alkaline Phosphatase: 60 U/L (ref 39–117)
BUN: 12 mg/dL (ref 6–23)
CO2: 27 mEq/L (ref 19–32)
Calcium: 9.9 mg/dL (ref 8.4–10.5)
Chloride: 97 mEq/L (ref 96–112)
Creatinine, Ser: 0.87 mg/dL (ref 0.40–1.20)
Glucose, Bld: 107 mg/dL — ABNORMAL HIGH (ref 70–99)
Potassium: 4.6 mEq/L (ref 3.5–5.3)
Sodium: 137 mEq/L (ref 135–145)
Total Bilirubin: 0.8 mg/dL (ref 0.3–1.2)
Total Protein: 7.3 g/dL (ref 6.0–8.3)

## 2009-02-17 LAB — CEA: CEA: 1.1 ng/mL (ref 0.0–5.0)

## 2009-02-19 ENCOUNTER — Encounter: Payer: Self-pay | Admitting: Internal Medicine

## 2009-03-26 ENCOUNTER — Encounter: Payer: Self-pay | Admitting: Gastroenterology

## 2009-05-14 ENCOUNTER — Encounter: Payer: Self-pay | Admitting: Internal Medicine

## 2009-11-12 ENCOUNTER — Ambulatory Visit: Payer: Self-pay | Admitting: Hematology and Oncology

## 2009-11-16 LAB — CBC WITH DIFFERENTIAL/PLATELET
BASO%: 0.6 % (ref 0.0–2.0)
Basophils Absolute: 0 10*3/uL (ref 0.0–0.1)
EOS%: 1.9 % (ref 0.0–7.0)
Eosinophils Absolute: 0.1 10*3/uL (ref 0.0–0.5)
HCT: 38.6 % (ref 34.8–46.6)
HGB: 13.4 g/dL (ref 11.6–15.9)
LYMPH%: 33.9 % (ref 14.0–49.7)
MCH: 31.2 pg (ref 25.1–34.0)
MCHC: 34.7 g/dL (ref 31.5–36.0)
MCV: 90 fL (ref 79.5–101.0)
MONO#: 0.4 10*3/uL (ref 0.1–0.9)
MONO%: 7.7 % (ref 0.0–14.0)
NEUT#: 3.2 10*3/uL (ref 1.5–6.5)
NEUT%: 55.9 % (ref 38.4–76.8)
Platelets: 192 10*3/uL (ref 145–400)
RBC: 4.29 10*6/uL (ref 3.70–5.45)
RDW: 12.6 % (ref 11.2–14.5)
WBC: 5.7 10*3/uL (ref 3.9–10.3)
lymph#: 1.9 10*3/uL (ref 0.9–3.3)

## 2009-11-16 LAB — COMPREHENSIVE METABOLIC PANEL WITH GFR
ALT: 27 U/L (ref 0–35)
AST: 28 U/L (ref 0–37)
Albumin: 4.7 g/dL (ref 3.5–5.2)
Alkaline Phosphatase: 63 U/L (ref 39–117)
BUN: 16 mg/dL (ref 6–23)
CO2: 25 meq/L (ref 19–32)
Calcium: 9.2 mg/dL (ref 8.4–10.5)
Chloride: 98 meq/L (ref 96–112)
Creatinine, Ser: 0.95 mg/dL (ref 0.40–1.20)
Glucose, Bld: 101 mg/dL — ABNORMAL HIGH (ref 70–99)
Potassium: 4.2 meq/L (ref 3.5–5.3)
Sodium: 139 meq/L (ref 135–145)
Total Bilirubin: 0.5 mg/dL (ref 0.3–1.2)
Total Protein: 7.4 g/dL (ref 6.0–8.3)

## 2009-11-16 LAB — CA 125: CA 125: 6.9 U/mL (ref 0.0–30.2)

## 2009-11-16 LAB — CEA: CEA: 1 ng/mL (ref 0.0–5.0)

## 2009-11-19 ENCOUNTER — Encounter: Payer: Self-pay | Admitting: Internal Medicine

## 2010-01-19 ENCOUNTER — Encounter: Payer: Self-pay | Admitting: Internal Medicine

## 2010-01-19 ENCOUNTER — Ambulatory Visit: Payer: Self-pay | Admitting: Internal Medicine

## 2010-02-15 ENCOUNTER — Encounter: Payer: Self-pay | Admitting: Internal Medicine

## 2010-05-07 ENCOUNTER — Other Ambulatory Visit: Payer: Self-pay | Admitting: Hematology and Oncology

## 2010-05-07 DIAGNOSIS — C189 Malignant neoplasm of colon, unspecified: Secondary | ICD-10-CM

## 2010-05-18 NOTE — Procedures (Signed)
Summary: COLON   Colonoscopy  Procedure date:  03/22/2006  Findings:      Location:  [facility].  Results: Colon Cancer.      Results: Hemorrhoids.       Procedures Next Due Date:    Colonoscopy: 03/2008 Patient Name: Cynthia Frank, Cynthia Frank. MRN:  Procedure Procedures: Colonoscopy CPT: [id number].    with biopsy. CPT: Q5068410.    with Hot Biopsy(s)CPT: Z451292.    with polypectomy. CPT: A3573898.    with Subm injection of [place] ink, CPT: [phone]  Personnel: Endoscopist: Venita Lick. Russella Dar, MD, Clementeen Graham.  Exam Location: Exam performed in Outpatient Clinic. Outpatient  Patient Consent: Procedure, Alternatives, Risks and Benefits discussed, consent obtained, from patient. Consent was obtained by the RN.  Indications  Increased Risk Screening: Personal history of ovarian cancer.  History  Current Medications: Patient is not currently taking Coumadin.  Pre-Exam Physical: Performed Mar 22, 2006. Cardio-pulmonary exam, Rectal exam, HEENT exam , Abdominal exam, Mental status exam WNL.  Comments: Pt. history reviewed/updated, physical exam performed prior to initiation of sedation?Yes Exam Exam: Extent of exam reached: Cecum, extent intended: Cecum.  The cecum was identified by appendiceal orifice and IC valve. Time to Cecum: 00:10: 01. Time for Withdrawl: 00:32:18. Colon retroflexion performed. ASA Classification: II. Tolerance: excellent.  Monitoring: Pulse and BP monitoring, Oximetry used. Supplemental O2 given.  Colon Prep Used MoviPrep for colon prep. Prep results: excellent.  Sedation Meds: Patient assessed and found to be appropriate for moderate (conscious) sedation. Fentanyl 125 mcg. given IV. Versed 12 given IV.  Findings MULTIPLE POLYPS: Ascending Colon. minimum size 4 mm, maximum size 8 mm. Procedure:  hot biopsy, removed, Polyp retrieved, 2 polyps Polyps sent to pathology. ICD9: Liver Metastasis: 197.7.  POLYP: Ascending Colon, Maximum size: 10 mm. sessile  polyp. Procedure:  snare with cautery, The polyp was removed piece meal. removed, retrieved, Polyp sent to pathology. ICD9: Colon Polyps: 211.3.  NORMAL EXAM: Ascending Colon.  - Injection: Ascending Colon. 4 ccs. Outcome: successful. Comments: [place] ink tattoo 5 cm distal to the 20mm polyp not removed.  - POLYP: Ascending Colon, Maximum size: 20 mm. sessile polyp. not removed, ICD9: Colon Polyps: 211.3.  TUMOR: in Ascending Colon. Length: 2 cm, Biopsy/Tumor taken. ICD9: Neoplasia, Malignant, Suspected: 199.1. Comments: Ulcerated tumor in prox asc colon.  NORMAL EXAM: Cecum.  NORMAL EXAM: Hepatic Flexure to Sigmoid Colon.  HEMORRHOIDS: Internal. Size: Small. Not bleeding. Not thrombosed. ICD9: Hemorrhoids, Internal: 455.0.   Assessment  Diagnoses: 199.1: Neoplasia, Malignant, Suspected.  211.3: Colon Polyps.  455.0: Hemorrhoids, Internal.   Events  Unplanned Interventions: No intervention was required.  Unplanned Events: There were no complications. Plans  Post Exam Instructions: Post sedation instructions given.  Medication Plan: Await pathology.  Patient Education: Patient given standard instructions for: Polyps. Hemorrhoids.  Disposition: After procedure patient sent to recovery. After recovery patient sent home.  Scheduling/Referral: Colonoscopy, to [facility] T. Russella Dar, MD, [facility], around Mar 22, 2008.  Surgery, R hemicolectomy, next available appt,    This report was created from the original endoscopy report, which was reviewed and signed by the above listed endoscopist.   CC: Rosalyn Gess. Norins, MD  FINAL DIAGNOSIS    ***MICROSCOPIC EXAMINATION AND DIAGNOSIS***    1. COLON, ASCENDING, ULCERATIVE LESION, BIOPSIES: POORLY   DIFFERENTIATED ADENOCARCINOMA.    2. COLON, ASCENDING POLYPS: HYPERPLASTIC POLYPS WITH SUBMUCOSAL   ADIPOSE TISSUE. SEE COMMENT. NO ADENOMATOUS CHANGE OR   MALIGNANCY IDENTIFIED.    COMMENT   1. The biopsy fragments are  uninvolved by  poorly differentiated   carcinoma which focally has features of adenocarcinoma. One of   the fragments has a microscopic area which may represent a small   portion of pre-existing adenoma which would suggest primary   colonic poorly differentiated adenocarcinoma.    2. The ascending polyps show large portions of benign adipose   tissue in the submucosal fragments which may represent portions   of lipoma. The overlying mucosa shows mucosa changes.    Dr. Clelia Croft has reviewed part 1 and agrees.    Results called to [facility] in Dr. Russella Dar' s office on 03/24/06.   Report faxed to Dr. Russella Dar' s office on 03/24/06. (JDP:gt,   03/24/06)    gdt   Date Reported: 03/24/2006 Beulah Gandy. Luisa Hart, MD   *** Electronically Signed Out By JDP ***    Clinical information   1. Biopsy of ulcerative lesion; screening (gt)    specimen(s) obtained   1: Colon, biopsy, ascending   2: Colon, polyp(s), ascending 4 mm to 10 mm    Gross Description   1. Received in formalin are tan, soft tissue fragments that are   submitted in toto. Number: four. Size: 0.1 to 0.3 cm.   Block Summary: Toto one cassette.    2. Received in formalin are four irregular to polypoid shaped   portions of soft reddish-tan tissue that vary from 0.4 to 1 cm in   greatest dimension. Block Summary: The two larger specimens are   bisected and all material is submitted in one cassette. (BM:caf   03/23/06)    cf/

## 2010-05-18 NOTE — Letter (Signed)
   [facility] [address] [place], [place]  [address] Phone: [phone]      February 15, 2010   Cynthia Frank [address] [place], [place] [address]  RE:  LAB RESULTS  Dear  Ms. Fazzini,  The following is an interpretation of your most recent lab tests.  Please take note of any instructions provided or changes to medications that have resulted from your lab work.  bone density study reveals osteopenia at the spine and hips.  You need to have at leat 1200mg  daily of calcium and 1000 international units of Vitamin D along with regular weight bearing exercise, such as walking. Please feel free to come in to see me to discuss the possibility of medical therapy given your multiple medical problems.   Sincerely Yours,    Jacques Navy MD

## 2010-05-18 NOTE — Letter (Signed)
[facility]   Imported By: Sherian Rein 12/02/2009 11:21:57  _____________________________________________________________________  External Attachment:    Type:   Image     Comment:   External Document

## 2010-08-03 ENCOUNTER — Ambulatory Visit (INDEPENDENT_AMBULATORY_CARE_PROVIDER_SITE_OTHER): Payer: Medicare Other | Admitting: Internal Medicine

## 2010-08-03 VITALS — BP 110/72 | HR 74 | Temp 97.9°F | Wt 156.0 lb

## 2010-08-03 DIAGNOSIS — M8580 Other specified disorders of bone density and structure, unspecified site: Secondary | ICD-10-CM

## 2010-08-03 DIAGNOSIS — M899 Disorder of bone, unspecified: Secondary | ICD-10-CM

## 2010-08-03 NOTE — Progress Notes (Signed)
  Subjective:    Patient ID: Cynthia Frank, female    DOB: Mar 06, 1936, 75 y.o.   MRN: [id number]  HPI Cynthia Frank presnets for follow-up. In the interal since her last visit in fall '11 she has been diagnosed with PAF and is on beta-blockers and warfarin under the direction of Armanda Magic MD. She has also been diagnosed with a cardiomyopathy. She reports that her symptoms are transient and not symptomatic.  We discuss her DXA scan with T-scores of -1.7 for femoral necks, -1.1. For spine. No real need for medication.   She had a bout of colitis - treated by Dr. Russella Dar. Question of C. Diff - not confirmed. She did have inflammatory bowel disease. She was told to take immodium as needed.   She is followed for h/o colon cancer and is scheduled for follow-up CT scan. She is 3 years out.  She had a mammogram in January.   Past Medical History  Diagnosis Date  . Hx: UTI (urinary tract infection)   . Nonischemic cardiomyopathy     chemo related  . History of ovarian cancer   . Hypertension   . History of colon cancer   . Hx of adenomatous colonic polyps   . Partial bowel obstruction   . Internal hemorrhoid    Past Surgical History  Procedure Date  . Abdominal hysterectomy   . Oophorectomy     '61  . Tonsillectomy     '44  . Resection of right ovarian cancer '84, repeat '86   . Laparotomy     '80 adhesions  . Resection due to partial bowel obstruction     1985  . Colon surgery     for colon cancer in 2007  . Laparotomy for takedown of intestinal obstruction secondary to adhesions     1989   Family History  Problem Relation Age of Onset  . Cancer Mother     uterine cancer  . Cancer Father     prostate  . Heart disease Father    PMH, FamHx and SocHx reviewed for any changes and relevance.     Review of Systems Review of Systems  Constitutional:  Negative for fever, chills, activity change and unexpected weight change.  HENT:  Negative for hearing loss, ear pain,  congestion, neck stiffness and postnasal drip.   Eyes: Negative for pain, discharge and visual disturbance.  Respiratory: Negative for chest tightness and wheezing.   Cardiovascular: Negative for chest pain and palpitations.       [No decreased exercise tolerance Gastrointestinal: [No change in bowel habit. No bloating or gas. No reflux or indigestion Genitourinary: Negative for urgency, frequency, flank pain and difficulty urinating.  Musculoskeletal: Negative for myalgias, back pain, arthralgias and gait problem.  Neurological: Negative for dizziness, tremors, weakness and headaches.  Hematological: Negative for adenopathy.  Psychiatric/Behavioral: Negative for behavioral problems and dysphoric mood.       Objective:   Physical Exam WNWD white female in no distress HEENT - normal Chest Clear Cor RRR        Assessment & Plan:  1. Abnormal bone density study - patient with mild osteopenia. No indication for medical therapy.  Plan - calcum 1200mg  daily; Vit D [phone] iu daily  (More than 50% of a 20 min visit spent on counseling and education.)

## 2010-08-13 ENCOUNTER — Telehealth: Payer: Self-pay | Admitting: *Deleted

## 2010-08-13 MED ORDER — ZOLPIDEM TARTRATE 10 MG PO TABS: 10.0000 mg | ORAL_TABLET | Freq: Every evening | ORAL | Status: DC | PRN

## 2010-08-13 NOTE — Telephone Encounter (Signed)
OK for zolpidem 5mg  po qhs, # 30, sig 1 po qhs prn , 5 refills

## 2010-08-13 NOTE — Telephone Encounter (Signed)
Called in previous dose - OK PER MD

## 2010-08-13 NOTE — Telephone Encounter (Signed)
Patient requesting refill of ambien - called into [facility] (Says MD told her he was going to call this in at last ov)

## 2010-08-16 ENCOUNTER — Telehealth: Payer: Self-pay | Admitting: *Deleted

## 2010-08-16 NOTE — Telephone Encounter (Signed)
Patient requesting to try voltaren gel for her knee pain. She is currently on coumadin and spoke w/pharmacist for suggestions. Please advise.

## 2010-08-17 ENCOUNTER — Other Ambulatory Visit: Payer: Self-pay | Admitting: Hematology and Oncology

## 2010-08-17 ENCOUNTER — Encounter (HOSPITAL_BASED_OUTPATIENT_CLINIC_OR_DEPARTMENT_OTHER): Payer: Medicare Other | Admitting: Hematology and Oncology

## 2010-08-17 ENCOUNTER — Encounter (HOSPITAL_COMMUNITY): Payer: Self-pay

## 2010-08-17 ENCOUNTER — Ambulatory Visit (HOSPITAL_COMMUNITY)
Admission: RE | Admit: 2010-08-17 | Discharge: 2010-08-17 | Disposition: A | Discharge status: Home / Self Care | Payer: Medicare Other | Source: Ambulatory Visit | Attending: Hematology and Oncology | Admitting: Hematology and Oncology

## 2010-08-17 DIAGNOSIS — C189 Malignant neoplasm of colon, unspecified: Secondary | ICD-10-CM

## 2010-08-17 DIAGNOSIS — Z90710 Acquired absence of both cervix and uterus: Secondary | ICD-10-CM | POA: Insufficient documentation

## 2010-08-17 DIAGNOSIS — Z8543 Personal history of malignant neoplasm of ovary: Secondary | ICD-10-CM

## 2010-08-17 DIAGNOSIS — Z980 Intestinal bypass and anastomosis status: Secondary | ICD-10-CM | POA: Insufficient documentation

## 2010-08-17 DIAGNOSIS — J984 Other disorders of lung: Secondary | ICD-10-CM | POA: Insufficient documentation

## 2010-08-17 DIAGNOSIS — Z85038 Personal history of other malignant neoplasm of large intestine: Secondary | ICD-10-CM | POA: Insufficient documentation

## 2010-08-17 HISTORY — DX: Malignant neoplasm of colon, unspecified: C18.9

## 2010-08-17 LAB — CBC WITH DIFFERENTIAL/PLATELET
BASO%: 0.6 % (ref 0.0–2.0)
Basophils Absolute: 0 10*3/uL (ref 0.0–0.1)
EOS%: 1.8 % (ref 0.0–7.0)
Eosinophils Absolute: 0.1 10*3/uL (ref 0.0–0.5)
HCT: 37 % (ref 34.8–46.6)
HGB: 12.6 g/dL (ref 11.6–15.9)
LYMPH%: 31.8 % (ref 14.0–49.7)
MCH: 30.8 pg (ref 25.1–34.0)
MCHC: 34.1 g/dL (ref 31.5–36.0)
MCV: 90.1 fL (ref 79.5–101.0)
MONO#: 0.5 10*3/uL (ref 0.1–0.9)
MONO%: 8.2 % (ref 0.0–14.0)
NEUT#: 3.5 10*3/uL (ref 1.5–6.5)
NEUT%: 57.6 % (ref 38.4–76.8)
Platelets: 155 10*3/uL (ref 145–400)
RBC: 4.11 10*6/uL (ref 3.70–5.45)
RDW: 13.2 % (ref 11.2–14.5)
WBC: 6.1 10*3/uL (ref 3.9–10.3)
lymph#: 2 10*3/uL (ref 0.9–3.3)

## 2010-08-17 LAB — CEA: CEA: 0.9 ng/mL (ref 0.0–5.0)

## 2010-08-17 LAB — CMP (CANCER CENTER ONLY)
ALT(SGPT): 47 U/L (ref 10–47)
AST: 45 U/L — ABNORMAL HIGH (ref 11–38)
Albumin: 3.9 g/dL (ref 3.3–5.5)
Alkaline Phosphatase: 56 U/L (ref 26–84)
BUN, Bld: 15 mg/dL (ref 7–22)
CO2: 28 mEq/L (ref 18–33)
Calcium: 9.1 mg/dL (ref 8.0–10.3)
Chloride: 93 mEq/L — ABNORMAL LOW (ref 98–108)
Creat: 0.7 mg/dl (ref 0.6–1.2)
Glucose, Bld: 107 mg/dL (ref 73–118)
Potassium: 4 mEq/L (ref 3.3–4.7)
Sodium: 141 mEq/L (ref 128–145)
Total Bilirubin: 0.6 mg/dl (ref 0.20–1.60)
Total Protein: 7.2 g/dL (ref 6.4–8.1)

## 2010-08-17 MED ORDER — IOHEXOL 300 MG/ML  SOLN
100.0000 mL | Freq: Once | INTRAMUSCULAR | Status: AC | PRN
  Administered 2010-08-17: 100 mL via INTRAVENOUS

## 2010-08-17 MED ORDER — DICLOFENAC SODIUM 1 % TD GEL: 1.0000 "application " | Freq: Four times a day (QID) | TRANSDERMAL | Status: DC

## 2010-08-17 NOTE — Telephone Encounter (Signed)
RX approved and sent in to pharmacy. Returned call to patient, line busy will try again later

## 2010-08-17 NOTE — Telephone Encounter (Signed)
Left vm for pt to check w/her pharm 

## 2010-08-17 NOTE — Telephone Encounter (Signed)
OK to try voltaren gel apply 4 times a day to the knee

## 2010-08-18 ENCOUNTER — Telehealth: Payer: Self-pay | Admitting: Internal Medicine

## 2010-08-18 NOTE — Telephone Encounter (Signed)
Forwarded to Dr. Norins for review. °

## 2010-08-19 ENCOUNTER — Encounter (HOSPITAL_BASED_OUTPATIENT_CLINIC_OR_DEPARTMENT_OTHER): Payer: Medicare Other | Admitting: Hematology and Oncology

## 2010-08-19 ENCOUNTER — Other Ambulatory Visit: Payer: Self-pay | Admitting: Hematology and Oncology

## 2010-08-19 DIAGNOSIS — C569 Malignant neoplasm of unspecified ovary: Secondary | ICD-10-CM

## 2010-08-19 DIAGNOSIS — C189 Malignant neoplasm of colon, unspecified: Secondary | ICD-10-CM

## 2010-08-24 ENCOUNTER — Encounter: Payer: Self-pay | Admitting: Internal Medicine

## 2010-09-03 NOTE — Op Note (Signed)
Cynthia Frank, Cynthia Frank            ACCOUNT NO.:  [id number]   MEDICAL RECORD NO.:  [id number]          PATIENT TYPE:  INP   LOCATION:  0001                         FACILITY:  [facility]   PHYSICIAN:  Angelia Mould. Derrell Lolling, M.D.DATE OF BIRTH:  03-07-1936   DATE OF PROCEDURE:  DATE OF DISCHARGE:                               OPERATIVE REPORT   PREOPERATIVE DIAGNOSIS:  Adenocarcinoma of the right colon.   POSTOPERATIVE DIAGNOSIS:  Adenocarcinoma of the right colon.   OPERATION PERFORMED:  Right colectomy.   SURGEON:  Dr. Claud Kelp   FIRST ASSISTANT:  Dr. Cyndia Bent   OPERATIVE INDICATIONS:  This is a 75 year old white female who had  oophorectomy for ovarian cancer in 1985 with chemotherapy under the  direction of Dr. Isabel Caprice.  She has no known recurrence to-date.  She underwent a screening colonoscopy recently.  Dr. Russella Dar describes a 2-  cm tumor in the ascending colon just above the ileocecal valve, which  was biopsied and showed adenocarcinoma.  She also had a polyp in the mid  ascending colon, which was not removed, and there was a polyp near the  hepatic flexure, which was removed, showing benign hyperplastic polyp.  She had a CT scan which shows no sign of any metastatic disease.  There  were a couple of tiny nodules in the base of the lungs which are  indeterminate and will require follow-up CT scanning in 6-12 months.  The patient has been advised of this and she has agreed to have a CT  scan in about six months.  She underwent a bowel prep at home, which  went well.  She is brought to operating room electively.   OPERATIVE FINDINGS:  The patient had moderate soft chronic adhesions to  the anterior abdominal wall and the small bowel loops to each other and  small bowel loops to the transverse colon mesentery.  All of these were  taken down without incident.  There was a small palpable mass just above  the ileocecal valve which was felt to be consistent with the  carcinoma.  There was a small palpable mass in the mid ascending colon and some  [place] ink there, consistent with the polyp that Dr. Russella Dar left behind.  There were no enlarged lymph nodes.  The pelvis was clean.  There was no  sign of any metastatic disease in the retroperitoneum, omentum or liver.  The gallbladder felt normal.   OPERATIVE TECHNIQUE:  Following the induction of general endotracheal  anesthesia, the patient was identified as to correct procedure and  correct patient.  Intravenous antibiotics were given prior to the  incision.  The abdomen was prepped and draped in sterile fashion.  The  Foley catheter had been inserted prior to prepping the abdomen.   A midline laparotomy was performed, going through the previous scar.  Dissection was carried down through the subcutaneous tissue and through  the midline fascia.  We entered the peritoneal space fairly easily in  the upper abdomen and then slowly opened the incision, taking down  adhesions as we went.  After about 10 minutes we had the  incision  completely open and all of the omentum and small bowel loops completely  dissected away from the undersurface without any problem.  We explored  the abdomen with findings as described above.  We had to spend about 30  minutes taking down small bowel adhesions in the pelvis and right lower  quadrant and under the transverse colon mesentery, until we had the  small bowel completely freed up.  We then mobilized the terminal ileum  and the right colon and hepatic flexure by dividing the lateral  peritoneal attachments.  Larger attachments were controlled with the  LigaSure device.  We transected the terminal ileum about 6 inches  proximal to the ileocecal valve.  There was one small 2-mm white nodule  in the small bowel mesentery consistent with some fat necrosis.  This  was marked with a suture and mentioned to Dr. Laureen Ochs.  The small bowel was  transected with the GIA stapling device.   We cleaned off the transverse  colon, preserving the left branch of the middle colic artery.  We  transected the colon with the GIA stapling device.  The mesentery was  then carefully cleaned off and divided.  Smaller vessels were controlled  with the LigaSure device and a larger right colic vessel was controlled  with 2-0 silk sutures.  This was doubly ligated.  We were very careful  to identify the structures in the retroperitoneum and the duodenum,  which were not injured.  The specimen was removed.  The specimen was  examined by Dr. Laureen Ochs.  He identified the tumor in the proximal ascending  colon and the polyp in the mid ascending colon and said that the margins  were widely negative.   Anastomosis was created between the terminal ileum and the mid  transverse colon with a GIA stapling device.  This was inspected for  bleeding and there was none.  The defect in the bowel wall was closed  with a TA-60 stapling device.   At this point we changed our instruments and gloves.  The anastomosis  was intact and looked good.  The bowel was pink and viable on both  sides.  A few extra sutures of 3-0 silk were placed to reinforce the  staple line at strategic points.  The mesentery was closed with  interrupted figure-of-eight sutures of 3-0 silk.   We then irrigated the abdomen and pelvis with about 3 liters of saline.  Irrigation fluid was clear.  There did not appear to be any active  bleeding.  The colon and remaining omentum were returned to their  anatomic positions.  The midline fascia was closed with a running suture  of #1 PDS.  The skin was closed with skin staples.  Clean bandages were  placed and the patient taken to the recovery room in stable condition.  Estimated blood loss was about 50 mL.  Complications were none.  Sponge,  needle and instrument counts were correct.      Angelia Mould. Derrell Lolling, M.D.  Electronically Signed    HMI/MEDQ  D:  04/13/2006  T:  04/13/2006  Job:   [id number]   cc:   Rosalyn Gess. Norins, MD  [address]  [place]  [place] [address]   Venita Lick. Russella Dar, MD, FACG  [address]  [place]  [place] [address]   Armanda Magic, M.D.  Fax: [phone]

## 2010-09-03 NOTE — Discharge Summary (Signed)
NAMEZIVAH, MAYR            ACCOUNT NO.:  [id number]   MEDICAL RECORD NO.:  [id number]         PATIENT TYPE:  LINP   LOCATION:                               FACILITY:  [facility]   PHYSICIAN:  Angelia Mould. Derrell Lolling, M.D.DATE OF BIRTH:  02-Sep-1935   DATE OF ADMISSION:  04/13/2006  DATE OF DISCHARGE:  04/18/2006                               DISCHARGE SUMMARY   FINAL DIAGNOSIS:  1. Adenocarcinoma of the right colon.  Pathologic stage T3N0MX.  2. 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 Armanda Magic, stable.  3. Hypertension.  4. Status post oophorectomy for ovarian cancer in 1985.   OPERATIONS PERFORMED:  Right colectomy; date 04/13/2006.   HISTORY:  This is a 75 year old white female who is basically  asymptomatic from a gastrointestinal standpoint.  She had a screening  colonoscopy which revealed an approximately 2 cm tumor in the ascending  colon.  This was ulcerated.  Biopsy showed adenocarcinoma.  She also had  a polyp in the mid ascending colon which was not removed.  She  apparently also had a polyp in the distal ascending colon which was  removed and showed hyperplastic polyp.  I was asked to see her as a  Research scientist (medical) as an outpatient.  I saw her on 03/31/2006.  We needed her  past history of Adriamycin chemotherapy for ovarian cancer in 1985, no  known recurrence.  She was followed by Dr. Isabel Caprice for that, here  in [place].  She was counseled regarding colon resection.  She  underwent bowel prep at home; and was admitted the morning of surgery.   PHYSICAL EXAM:  GENERAL:  A pleasant, healthy-appearing woman in no  distress.  VITAL SIGNS:  Blood pressure 139/89, pulse 78, height 5 feet  5-1/2 inches, weight 151 pounds.  NECK:  No adenopathy or mass.  LUNGS:  Clear to auscultation.  HEART: Regular rate and rhythm, no murmur.  No  peripheral edema.  BREASTS:  Not examined.  ABDOMEN:  Soft, flat,  nontender.  A long lower midline incision  that extended slightly to the  left of the umbilicus, no mass nor hernia.  Liver and spleen were not  enlarged.   HOSPITAL COURSE:  On the day of admission the patient was taken to the  operating room and underwent a right colectomy.  The surgery was  uneventful.  We saw no evidence of metastatic disease.  She had moderate  adhesions from her previous laparotomies.  It was noted on preoperative  CT that she has some indeterminate small lung nodules; and that this  would need to be evaluated by the medical oncologist, but that probably  she would need a follow up CT scan in 6 months; and she was advised of  that, and agreed to do that.   Postoperatively she did fairly well.  She began tolerating sips of clear  liquids on postoperative day #1, but was continued for another couple of  days until her ileus resolved.  Her pathology report showed that this  was a T3N0 tumor.  She was advised of  that.  We advanced her diet,  slowly, over the next few days.  She was ready for discharge on April 18, 2006.  At that time she was tolerating her diet, nausea had resolved;  she had had stools.  Her abdomen was soft and nontender, and the  incision looked good.   She was asked to follow up with me in the office in 1 week for staple  removal.  We will plan to refer her to an oncologist as an outpatient.  She thinks that she wants to stay in [facility] for oncology care.  Once  again, we discussed with her that there were some small indeterminate  nodules in her lungs on CT that would need follow up; and she is aware  that.Angelia Mould. Derrell Lolling, M.D.  Electronically Signed     HMI/MEDQ  D:  05/26/2006  T:  05/26/2006  Job:  [id number]   cc:   Venita Lick. Russella Dar, MD, FACG  [address]  [place]  [place] [address]   Rosalyn Gess. Norins, MD  [address]  [place]  [place] [address]   Armanda Magic, M.D.  Fax: [phone]   Lynett Fish, M.D.

## 2010-09-03 NOTE — Assessment & Plan Note (Signed)
[facility]                           GASTROENTEROLOGY OFFICE NOTE   Cynthia, Frank                   MRN:          [id number]  DATE:02/15/2006                            DOB:          12-18-1935    Cynthia Frank is a very nice 75 year old white female that I have seen in the  past. Her husband is a patient of mine. Her primary physician is Dr. Illene Frank. She underwent colonoscopy in August 2002 which showed hyperplastic  colon polyps and internal hemorrhoids. She has a personal history of ovarian  cancer diagnosed at age 57. No family history of colon cancer, colon polyps  or inflammatory bowel disease. She relates no change in bowel habits, change  in stool caliber, abdominal pain, rectal pain, diarrhea or constipation.   PAST MEDICAL HISTORY:  1. Hypertension.  2. Stage 3 ovarian cancer, 1985.  3. Cardiomyopathy likely secondary to Adriamycin  4. Status post hysterectomy in 1980  5. Status post unilateral oophorectomy for ovarian cancer in 1985  6. Status post second unilateral oophorectomy at age 89  7. Partial bowel obstruction secondary to adhesions requiring surgery in      1989   CURRENT MEDICATIONS:  Listed on the chart and have been reviewed.   MEDICATIONS ALLERGIES:  SULFA DRUGS.   SOCIAL HISTORY:  Per the handwritten form.   REVIEW OF SYSTEMS:  Per the handwritten form.   PHYSICAL EXAMINATION:  GENERAL:  Well-developed, well-nourished, white  female in no acute distress.  VITAL SIGNS:  Height 5 feet 5 inches, weight 155.6 pounds, blood pressure is  120/60, pulse 68 and regular.  HEENT:  Anicteric sclera, oropharynx clear.  CHEST:  Clear to auscultation bilaterally.  CARDIAC:  Regular rate and rhythm without murmurs appreciated.  ABDOMEN:  Soft, nontender, nondistended, normal active bowel sounds, no  palpable organomegaly, masses or hernias.  RECTAL:  Deferred until time of colonoscopy.  EXTREMITIES:  Without  clubbing, cyanosis or edema.  NEUROLOGIC:  Alert and oriented x3. Grossly nonfocal.   ASSESSMENT/PLAN:  1. Personal history of ovarian cancer at age 66. Rule out colorectal      neoplasms. The risks,      benefits, and alternatives to colonoscopy with possible biopsy and      possible polypectomy discussed with the patient and she consents to      proceed. This will be scheduled electively.     Cynthia Frank. Cynthia Dar, MD, [facility]  Electronically Signed    MTS/MedQ  DD: 03/06/2006  DT: 03/06/2006  Job #: [id number]

## 2010-09-03 NOTE — Assessment & Plan Note (Signed)
[facility]                           PRIMARY CARE OFFICE NOTE   AVERYANA, PILLARS                   MRN:          [id number]  DATE:06/21/2006                            DOB:          1936-03-18    Ms. Cranmer is a 75 year old woman who presents for followup evaluation  and exam.  She was last seen May 09, 2006 as a new patient.  Please  see that complete dictation.  This was reviewed with the patient in  detail with no significant additions or corrections, except to add  aspirin to her medication list, and her weight was corrected to 148  pounds.   In the interval since her last visit she has been doing well.   REVIEW OF SYSTEMS:  Negative for any constitutional, cardiovascular,  respiratory, GI, or GU problems.   PHYSICAL EXAMINATION:  Temperature was not taken, blood pressure was  120/90, pulse 72, weight 148.  GENERAL APPEARANCE:  A well-nourished, well-developed woman, looks her  stated age, in no acute distress.  HEENT EXAM:  Normocephalic, atraumatic.  EACs and TMs were unremarkable.  Oropharynx with native dentition in good repair.  No buccal or palate  lesions were noted.  Posterior pharynx was clear.  Conjunctivae and  sclerae were clear.  Patient has a small skin lesion on the lower eyelid  of the right.  Pupils are equal, round, and reactive to light and  accommodation.  Funduscopic exam was unremarkable.  NECK:  Supple without thyromegaly.  NODES:  No lymphadenopathy was noted in the cervical, supraclavicular,  or axillary regions.  CHEST:  No CVA tenderness.  LUNGS:  Clear to auscultation and percussion.  CARDIOVASCULAR:  2+ radial pulses, no JVD or carotid bruits.  She had a  quiet precordium with a regular rate and rhythm without murmurs, rubs,  or gallops.  BREAST EXAM:  Skin was normal, nipples without discharge, no fixed mass,  lesion, or abnormality was noted.  ABDOMEN:  Soft, no guarding, no rebound.  No  organosplenomegaly was  noted.  PELVIC EXAM:  Deferred to patient having had hysterectomy.  RECTAL EXAM:  Deferred to recent colonoscopy.  EXTREMITIES:  Without cyanosis, clubbing, edema or deformity.  NEUROLOGIC EXAM:  Nonfocal.   LABORATORY DATA:  Patient's laboratory, I do have March 29, 2006, at  [facility] with an LDL cholesterol of 119, total cholesterol of 191.  Patient had basic metabolic panel, December 31st, at [facility] with  a glucose of 118, potassium 3.6, creatinine was 0.7, complete blood  count from December 28th with a hemoglobin of 11.2 g, white count was  9,700 with a normal differential.  Patient had a CEA April 12, 2006,  that was negative at 0.5.  Patient had a CA125 that was normal at 11.   ASSESSMENT AND PLAN:  1. Hypertension.  Patient's blood pressure is adequately controlled on      her present medical regimen.  She will continue the same.  2. History of nonischemic cardiomyopathy.  Patient is stable and doing      well with no signs of decompensation.  She will continue on  her      present medication.  3. History of colon cancer.  Patient has made an excellent recovery      from her surgery and is doing very well.  4. Ovarian cancer.  Patient is a survivor and doing well.  5. Health maintenance.  Patient with a normal breast exam today.  She      did have a mammogram January 17th of 2008 which was normal.      Patient's colon, of course, was evaluated as part of her evaluation      for colon cancer.  She will have followup colonoscopy as      instructed.   In summary, this is a delightful patient who is medically stable at this  time.  She is now fully established as an ongoing, regular, primary care  patient.  She has asked to return to see me on an as-needed basis, or  for routine examination in 1 year.     Rosalyn Gess Norins, MD  Electronically Signed    MEN/MedQ  DD: 06/22/2006  DT: 06/22/2006  Job #: [id number]   cc:   Ms. Silverio Decamp

## 2010-09-03 NOTE — Assessment & Plan Note (Signed)
[facility]                           PRIMARY CARE OFFICE NOTE   [name], Frank                   MRN:          [id number]  DATE:05/09/2006                            DOB:          [birth year]    Cynthia Frank presents to establish for ongoing continuity of primary  care.   PAST MEDICAL HISTORY:   SURGICAL:  1. Tonsillectomy in 1944.  2. Left oophorectomy, 1961.  3. Hysterectomy to metrorrhagia in 1980.  4. Ovarian cancer with resection in 1985.  5. Laparotomy and recheck in 1986.  6. Laparotomy for takedown of intestinal obstruction secondary to      adhesions, 1989.  7. Colectomy secondary to adenocarcinoma, right colon, 2007.   MEDICAL ILLNESSES:  1. Measles as a child and otherwise fully immunized.  2. Ovarian cancer, status post debulking and chemotherapy with      Adriamycin and cisplatin and Cytoxan with no evidence of recurrent      malignancy.  3. Nonischemic cardiomyopathy secondary to Adriamycin with an EF of      40%.  4. Hypertension.  5. Colon cancer with adenocarcinoma of the right colon, status post      resection with end-to-end anastomosis with no need for      consolidation of chemotherapy or radiation therapy.  6. UTIs.  7. GYN:  Gravida 3, para 2, with 1 SAB.   PHYSICIAN ROSTER:  1. Dr. Armanda Magic, Cardiology.  2. Dr. Dalene Carrow, Oncology.  3. Dr. Russella Dar, GI.  4. Dr. Dione Booze, Ophthalmology.  5. Dr. Derrell Lolling, General Surgery.   CURRENT MEDICATIONS:  1. Toprol-XL 25 mg daily.  2. Altace 7.5 mg daily.  3. Lasix 20 mg daily.  4. Fish oil.  5. Vitamin C.  6. Calcium.  7. Multivitamins.   FAMILY HISTORY:  Positive for uterine cancer in her mother.  Father with  prostate cancer.  Father died of an MI at age 65.  Positive for  emotional illness in secondary kinship.  Positive for diabetes in  secondary kinship.   HABITS:  Alcohol:  Averaging 2 ounces per day.  Tobacco:  None, but she  has a 20-pack-year smoking  history and quit at age 8.   SOCIAL HISTORY:  The patient is a Buyer, retail in biology.  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  Married 43 years.  Two daughters, 3 grandchildren.  The patient does live independently.  Her husband is retired; he is in reasonably good health, but he does  have pulmonary issues and sees Dr. Francella Solian.   REVIEW OF SYSTEMS:  The patient has had a 7-pound weight loss after her  surgery.  She has had no fevers, sweats or chills.  She has had an eye  exam in the last 2 years.  No ENT complaints.  No cardiovascular  complaints and no limitations in her activities with her cardiomyopathy.  No respiratory complaints. The patient does have occasional heartburn.  She has a normal bowel habits.  No GU, musculoskeletal or dermatologic  complaints.   PHYSICAL EXAMINATION:  VITAL SIGNS:  Temperature was  97.4, blood  pressure 130/83, pulse 69.  Weight 168.  GENERAL APPEARANCE:  A well-nourished, well-developed woman in no acute  distress.  No further exam conducted with the patient having had recent  multiple examinations as part of her hospitalization, with last full  physical exam by Dr. Daphine Deutscher, June 30, 2005.   RECORD REVIEW:  The patient's chart from [facility] was reviewed in detail.  The patient did have a cardiac catheterization, March 02, 2000, with  no coronary artery disease, ER of 40%.  The patient did have a gated  Cardiolite exercise stress test, February 24, 2000, with no significant  chest discomfort or problems, with normal perfusion images.  The patient  did have an echocardiogram performed February 24, 2000 with an EF of  approximately 40% with no valvular disease.   The patient's surgical report is on the chart from recent surgery.   Last laboratory from May 30, 2005 revealed the patient to have a  cholesterol of 199, triglycerides 68, LDL 119, HDL was 69.  Path reports  on the chart from her colonoscopy,  polypectomy and surgery.  The patient  had chemistries, May 05, 2004, which revealed a normal blood sugar  of 94.  Renal function was normal.  CA125 dated May 07, 2004 was  normal at 6.9.  The patient's last Pap smear from the old records was  May 05, 2004 and this was a normal Pap smear.   Last mammogram from April 27, 2005 was a normal study with  recommendation for routine screening in 1 year.   Last bone density study on old records is from June 27, 2003, which  showed T score at the AP spine of -1.0, at the lateral spine of -1.5, at  the femoral neck at a -1.7; total hip had a -0.9.  This essentially was  stable in comparison to studies on 2003 and 2001.   ASSESSMENT AND PLAN:  1. Hypertension, well controlled.  2. Cardiomyopathy, stable, with followup with Dr. Armanda Magic, with      the patient doing very well with no limitations in activities.  3. Oncology:  The patient is a survivor of stage III ovarian cancer in      1985 with no evidence of recurrence.  4. The patient has had colostomy for adenocarcinoma of the colon with      clean margins and no metastasis to lymph nodes with 42 nodes      surveyed.  No indication for adjunctive therapy.  5. Health maintenance:  The patient is current with mammography.  She      is current with Pap smears and will be due for a Pap smear in 2008.      She is currently with colonoscopy with findings as above with      surgical history.   I have asked the patient to return to see me for full physical exam  including breast exam in March or April, which would keep her on  schedule with previous examinations.     Rosalyn Gess Norins, MD  Electronically Signed    MEN/MedQ  DD: 05/11/2006  DT: 05/11/2006  Job #: [id number]   cc:   [address], [place], [place] [address] Ms. Nicholes Rough, M.D.

## 2010-09-20 ENCOUNTER — Encounter: Payer: Self-pay | Admitting: Gastroenterology

## 2010-10-13 ENCOUNTER — Encounter: Payer: Self-pay | Admitting: Gastroenterology

## 2010-10-13 ENCOUNTER — Ambulatory Visit (INDEPENDENT_AMBULATORY_CARE_PROVIDER_SITE_OTHER): Payer: Medicare Other | Admitting: Gastroenterology

## 2010-10-13 VITALS — BP 120/80 | HR 72 | Ht 65.5 in | Wt 154.6 lb

## 2010-10-13 DIAGNOSIS — Z7901 Long term (current) use of anticoagulants: Secondary | ICD-10-CM

## 2010-10-13 DIAGNOSIS — Z85038 Personal history of other malignant neoplasm of large intestine: Secondary | ICD-10-CM

## 2010-10-13 MED ORDER — PEG-KCL-NACL-NASULF-NA ASC-C 100 G PO SOLR: 1.0000 | Freq: Once | ORAL | Status: DC

## 2010-10-13 NOTE — Patient Instructions (Addendum)
You have been scheduled for a Colonoscopy. Separate instructions given. Pick up your prep kit from you pharmacy before your procedure. We will contact you regarding your coumadin clearance before your procedure. cc: Armanda Magic, MD       Arlan Organ, MD

## 2010-10-13 NOTE — Progress Notes (Signed)
History of Present Illness: This is a 75 year old female who returns for followup of T3, N0 colon cancer diagnosed in 03/2006. In addition she had a nonspecific colitis diagnosed at her last colonoscopy, possibly due to NSAIDs. Her diarrhea gradually resolved over the course of several months after discontinuing aspirin and NSAIDs. She states she has had no diarrhea since about September of last year.  She was diagnosed with atrial fibrillation and placed on Coumadin earlier this calendar year she is followed by Dr. Mayford Knife. I reviewed her recent chest abdomen and pelvis CT scan performed on May 12. No abdominal findings. There was slight interval increase in nodular pleural thickening in the right lung and stable, small pleural-based nodules in the left lung.  She denies abdominal pain, change in bowel habits, melena, hematochezia.  Current Medications, Allergies, Past Medical History, Past Surgical History, Family History and Social History were reviewed in [employer] record.  Physical Exam: General: Well developed , well nourished, no acute distress Head: Normocephalic and atraumatic Eyes:  sclerae anicteric, EOMI Ears: Normal auditory acuity Mouth: No deformity or lesions Lungs: Clear throughout to auscultation Heart: Regular rate and rhythm; no murmurs, rubs or bruits Abdomen: Soft, non tender and non distended. No masses, hepatosplenomegaly or hernias noted. Normal Bowel sounds Rectal: Deferred colonoscopy Musculoskeletal: Symmetrical with no gross deformities  Pulses:  Normal pulses noted Extremities: No clubbing, cyanosis, edema or deformities noted Neurological: Alert oriented x 4, grossly nonfocal Psychological:  Alert and cooperative. Normal mood and affect  Assessment and Recommendations:  1. Personal history of T3, N0 colon cancer diagnosed in December 2007. She is due for surveillance colonoscopy. The risks, benefits, and alternatives to colonoscopy with  possible biopsy and possible polypectomy were discussed with the patient and they consent to proceed. Ongoing follow up with Dr. Dalene Carrow.  2. Atrial fibrillation on chronic Coumadin anticoagulation. The risks, benefits and alternatives to 3-5 day hold Coumadin were discussed with the patient she consents to proceed. Will obtain clearance from Dr. Mayford Knife. If it is not advisable to hold her Coumadin we will  perform her colonoscopy while on Coumadin. The patient understands that large polyps would likely not be removed if she is anticoagulated during the colonoscopy .   3. Nonspecific colitis on last colonoscopy. Appears to be self-limited. Possible aspirin and NSAID related.

## 2010-10-15 ENCOUNTER — Telehealth: Payer: Self-pay

## 2010-10-15 NOTE — Telephone Encounter (Signed)
Left a message for patient to return my call regarding warfarin clearance before procedure.

## 2010-10-18 NOTE — Telephone Encounter (Signed)
Spoke with patient and informed her to come off warfarin 3 days prior to procedure. Pt agreed and verbalized understanding.

## 2010-11-02 ENCOUNTER — Encounter (HOSPITAL_COMMUNITY): Payer: Medicare Other

## 2010-11-03 ENCOUNTER — Ambulatory Visit (HOSPITAL_COMMUNITY)
Admission: RE | Admit: 2010-11-03 | Discharge: 2010-11-03 | Disposition: A | Discharge status: Home / Self Care | Payer: Medicare Other | Source: Ambulatory Visit | Attending: Cardiology | Admitting: Cardiology

## 2010-11-03 DIAGNOSIS — Z79899 Other long term (current) drug therapy: Secondary | ICD-10-CM | POA: Insufficient documentation

## 2010-11-03 DIAGNOSIS — I4891 Unspecified atrial fibrillation: Secondary | ICD-10-CM | POA: Insufficient documentation

## 2010-11-08 ENCOUNTER — Inpatient Hospital Stay (HOSPITAL_COMMUNITY)
Admission: AD | Admit: 2010-11-08 | Discharge: 2010-11-11 | DRG: 310 | Disposition: A | Discharge status: Home / Self Care | Payer: Medicare Other | Source: Ambulatory Visit | Attending: Cardiology | Admitting: Cardiology

## 2010-11-08 ENCOUNTER — Inpatient Hospital Stay (HOSPITAL_COMMUNITY): Payer: Medicare Other

## 2010-11-08 DIAGNOSIS — I059 Rheumatic mitral valve disease, unspecified: Secondary | ICD-10-CM | POA: Diagnosis present

## 2010-11-08 DIAGNOSIS — Z8543 Personal history of malignant neoplasm of ovary: Secondary | ICD-10-CM

## 2010-11-08 DIAGNOSIS — Z7982 Long term (current) use of aspirin: Secondary | ICD-10-CM

## 2010-11-08 DIAGNOSIS — I428 Other cardiomyopathies: Secondary | ICD-10-CM | POA: Diagnosis present

## 2010-11-08 DIAGNOSIS — E876 Hypokalemia: Secondary | ICD-10-CM | POA: Diagnosis present

## 2010-11-08 DIAGNOSIS — T451X5A Adverse effect of antineoplastic and immunosuppressive drugs, initial encounter: Secondary | ICD-10-CM | POA: Diagnosis present

## 2010-11-08 DIAGNOSIS — I4891 Unspecified atrial fibrillation: Principal | ICD-10-CM | POA: Diagnosis present

## 2010-11-08 DIAGNOSIS — I4949 Other premature depolarization: Secondary | ICD-10-CM | POA: Diagnosis present

## 2010-11-08 DIAGNOSIS — I2510 Atherosclerotic heart disease of native coronary artery without angina pectoris: Secondary | ICD-10-CM | POA: Diagnosis present

## 2010-11-08 DIAGNOSIS — Z7901 Long term (current) use of anticoagulants: Secondary | ICD-10-CM

## 2010-11-08 DIAGNOSIS — Z9221 Personal history of antineoplastic chemotherapy: Secondary | ICD-10-CM

## 2010-11-08 DIAGNOSIS — Z85038 Personal history of other malignant neoplasm of large intestine: Secondary | ICD-10-CM

## 2010-11-08 DIAGNOSIS — Z79899 Other long term (current) drug therapy: Secondary | ICD-10-CM

## 2010-11-08 DIAGNOSIS — I10 Essential (primary) hypertension: Secondary | ICD-10-CM | POA: Diagnosis present

## 2010-11-08 LAB — CBC
HCT: 37.6 % (ref 36.0–46.0)
Hemoglobin: 13 g/dL (ref 12.0–15.0)
MCH: 30.2 pg (ref 26.0–34.0)
MCHC: 34.6 g/dL (ref 30.0–36.0)
MCV: 87.4 fL (ref 78.0–100.0)
Platelets: 153 10*3/uL (ref 150–400)
RBC: 4.3 MIL/uL (ref 3.87–5.11)
RDW: 13.2 % (ref 11.5–15.5)
WBC: 5.8 10*3/uL (ref 4.0–10.5)

## 2010-11-08 LAB — COMPREHENSIVE METABOLIC PANEL
ALT: 32 U/L (ref 0–35)
AST: 32 U/L (ref 0–37)
Albumin: 3.5 g/dL (ref 3.5–5.2)
Alkaline Phosphatase: 63 U/L (ref 39–117)
BUN: 17 mg/dL (ref 6–23)
CO2: 28 mEq/L (ref 19–32)
Calcium: 8.9 mg/dL (ref 8.4–10.5)
Chloride: 99 mEq/L (ref 96–112)
Creatinine, Ser: 0.83 mg/dL (ref 0.50–1.10)
GFR calc Af Amer: >60 mL/min (ref >60)
GFR calc non Af Amer: >60 mL/min (ref >60)
Glucose, Bld: 87 mg/dL (ref 70–99)
Potassium: 3 mEq/L — ABNORMAL LOW (ref 3.5–5.1)
Sodium: 139 mEq/L (ref 135–145)
Total Bilirubin: 0.4 mg/dL (ref 0.3–1.2)
Total Protein: 6.9 g/dL (ref 6.0–8.3)

## 2010-11-08 LAB — DIFFERENTIAL
Basophils Absolute: 0 10*3/uL (ref 0.0–0.1)
Basophils Relative: 0 % (ref 0–1)
Eosinophils Absolute: 0.1 10*3/uL (ref 0.0–0.7)
Eosinophils Relative: 2 % (ref 0–5)
Lymphocytes Relative: 32 % (ref 12–46)
Lymphs Abs: 1.9 10*3/uL (ref 0.7–4.0)
Monocytes Absolute: 0.6 10*3/uL (ref 0.1–1.0)
Monocytes Relative: 10 % (ref 3–12)
Neutro Abs: 3.2 10*3/uL (ref 1.7–7.7)
Neutrophils Relative %: 55 % (ref 43–77)

## 2010-11-08 LAB — PROTIME-INR
INR: 2.45 — ABNORMAL HIGH (ref 0.00–1.49)
Prothrombin Time: 27 seconds — ABNORMAL HIGH (ref 11.6–15.2)

## 2010-11-08 LAB — TSH: TSH: 1.328 u[IU]/mL (ref 0.350–4.500)

## 2010-11-09 LAB — PROTIME-INR
INR: 2.5 — ABNORMAL HIGH (ref 0.00–1.49)
Prothrombin Time: 27.4 seconds — ABNORMAL HIGH (ref 11.6–15.2)

## 2010-11-10 LAB — BASIC METABOLIC PANEL
BUN: 14 mg/dL (ref 6–23)
CO2: 27 mEq/L (ref 19–32)
Calcium: 9 mg/dL (ref 8.4–10.5)
Chloride: 97 mEq/L (ref 96–112)
Creatinine, Ser: 0.85 mg/dL (ref 0.50–1.10)
GFR calc Af Amer: >60 mL/min (ref >60)
GFR calc non Af Amer: >60 mL/min (ref >60)
Glucose, Bld: 87 mg/dL (ref 70–99)
Potassium: 2.9 mEq/L — ABNORMAL LOW (ref 3.5–5.1)
Sodium: 138 mEq/L (ref 135–145)

## 2010-11-10 LAB — CBC
HCT: 39.2 % (ref 36.0–46.0)
Hemoglobin: 13.4 g/dL (ref 12.0–15.0)
MCH: 29.8 pg (ref 26.0–34.0)
MCHC: 34.2 g/dL (ref 30.0–36.0)
MCV: 87.1 fL (ref 78.0–100.0)
Platelets: 151 10*3/uL (ref 150–400)
RBC: 4.5 MIL/uL (ref 3.87–5.11)
RDW: 13.1 % (ref 11.5–15.5)
WBC: 5.9 10*3/uL (ref 4.0–10.5)

## 2010-11-10 LAB — PROTIME-INR
INR: 2.66 — ABNORMAL HIGH (ref 0.00–1.49)
Prothrombin Time: 28.8 seconds — ABNORMAL HIGH (ref 11.6–15.2)

## 2010-11-11 LAB — BASIC METABOLIC PANEL
BUN: 16 mg/dL (ref 6–23)
BUN: 14 mg/dL (ref 6–23)
CO2: 27 mEq/L (ref 19–32)
CO2: 28 mEq/L (ref 19–32)
Calcium: 9.1 mg/dL (ref 8.4–10.5)
Calcium: 9 mg/dL (ref 8.4–10.5)
Chloride: 99 mEq/L (ref 96–112)
Chloride: 96 mEq/L (ref 96–112)
Creatinine, Ser: 0.84 mg/dL (ref 0.50–1.10)
Creatinine, Ser: 0.8 mg/dL (ref 0.50–1.10)
GFR calc Af Amer: >60 mL/min (ref >60)
GFR calc Af Amer: >60 mL/min (ref >60)
GFR calc non Af Amer: >60 mL/min (ref >60)
GFR calc non Af Amer: >60 mL/min (ref >60)
Glucose, Bld: 94 mg/dL (ref 70–99)
Glucose, Bld: 96 mg/dL (ref 70–99)
Potassium: 3.3 mEq/L — ABNORMAL LOW (ref 3.5–5.1)
Potassium: 2.9 mEq/L — ABNORMAL LOW (ref 3.5–5.1)
Sodium: 137 mEq/L (ref 135–145)
Sodium: 136 mEq/L (ref 135–145)

## 2010-11-11 LAB — POTASSIUM: Potassium: 3.6 mEq/L (ref 3.5–5.1)

## 2010-11-11 LAB — PROTIME-INR
INR: 2.7 — ABNORMAL HIGH (ref 0.00–1.49)
Prothrombin Time: 29.1 seconds — ABNORMAL HIGH (ref 11.6–15.2)

## 2010-11-18 NOTE — H&P (Signed)
Cynthia Frank, Cynthia Frank            ACCOUNT NO.:  [id number]  MEDICAL RECORD NO.:  [id number]  LOCATION:  2040                         FACILITY:  [facility]  PHYSICIAN:  Armanda Magic, M.D.     DATE OF BIRTH:  1935/05/03  DATE OF ADMISSION:  11/08/2010 DATE OF DISCHARGE:  11/11/2010                             HISTORY & PHYSICAL   REFERRING PHYSICIAN:  I do not have.  CHIEF COMPLAINT:  Admission for drug loading with amiodarone.  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  Her metoprolol has been up-titrated with still persistent episodes of paroxysmal atrial fibrillation.  Now on the increased dose of metoprolol, she is feeling more fatigued.  She is also having shortness of breath.  She now presents for drug loading with amiodarone.  PAST MEDICAL HISTORY: 1. Idiopathic dilated cardiomyopathy, felt secondary to Adriamycin     therapy for both colon cancer and ovarian cancer.  EF recently     noted to be 30-35% on recent echo. 2. Hypertension. 3. PVCs. 4. Colon CA, status post resection. 5. Nonobstructive coronary artery disease with 30% diagonal. 6. Ovarian CA.  PAST SURGICAL HISTORY:  Colon resection for adhesions, oophorectomy, total abdominal hysterectomy, oophorectomy for ovarian cancer, and colon resection for colon cancer.  FAMILY HISTORY:  Her father died of MI.  Her mother died of uterine CA.  MEDICATIONS:  Currently on admission include: 1. Aspirin 81 mg daily. 2. Imodium A-D 2 mg 1 tablet as needed. 3. Multivitamin daily. 4. Potassium chloride 20 mEq daily. 5. Fish oil of 1000 mg daily. 6. Simvastatin 20 mg daily. 7. Coenzyme Q10 200 mg daily. 8. Furosemide 20 mg daily. 9. Ramipril 5 mg daily. 10.Metoprolol tartrate 100 mg b.i.d. 11.Warfarin.  ALLERGIES: 1. SULFA. 2. CLINDAMYCIN.  SOCIAL  HISTORY:  She is married.  She denies any tobacco use, although she used to smoke, but quit.  She occasionally drinks alcohol, usually wine up to 2 glasses a day.  She denies any IV drug use.  She has 2 children.  REVIEW OF SYSTEMS:  Otherwise as stated in HPI is negative.  PHYSICAL EXAMINATION:  GENERAL:  This is a well-developed, well- nourished female in no acute distress. HEENT:  Benign. NECK:  Supple without lymphadenopathy.  Carotid upstrokes are +2 bilaterally.  No bruits. LUNGS:  Clear to auscultation throughout. HEART:  Regular rate and rhythm.  No murmurs, rubs, or gallops.  Normal S1 and S2. ABDOMEN:  Soft, nontender, and nondistended.  Normoactive bowel sounds. No hepatosplenomegaly. EXTREMITIES:  No cyanosis, erythema, or edema.  Chest x-ray, labs, and EKG are pending at the time of dictation.  ASSESSMENT: 1. Paroxysmal atrial fibrillation with intermittent rapid ventricular     response, not suppressed by a higher dose of metoprolol.  She is     now having drug side effects of metoprolol therapy with increasing     fatigue and shortness of breath.  She is now admitted for drug     loading with amiodarone. 2. 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 3. History of ovarian and colon cancer. 4. Nonobstructive coronary  artery disease. 5. Hypertension.  PLAN:  Admit to telemetry unit.  Start amiodarone 400 mg b.i.d.  We will decrease metoprolol 250 mg b.i.d.  We will continue warfarin per pharmacy protocol.  Pulmonary function tests were checked prior to admission and were normal.  We will check LFTs and TSH and check an EKG daily.     Armanda Magic, M.D.     TT/MEDQ  D:  11/16/2010  T:  11/17/2010  Job:  [id number]  Electronically Signed by Armanda Magic M.D. on 11/18/2010 08:53:31 AM

## 2010-11-18 NOTE — Discharge Summary (Signed)
NAMEHEDY, Cynthia Frank            ACCOUNT NO.:  [id number]  MEDICAL RECORD NO.:  [id number]  LOCATION:  2040                         FACILITY:  [facility]  PHYSICIAN:  Armanda Magic, M.D.     DATE OF BIRTH:  Jan 28, 1936  DATE OF ADMISSION:  11/08/2010 DATE OF DISCHARGE:  11/11/2010                              DISCHARGE SUMMARY   ADMISSION DIAGNOSES: 1. Atrial fibrillation. 2. 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 3. Systemic anticoagulation. 4. Hypertension. 5. Premature ventricular contractions. 6. Colon carcinoma, status post resection. 7. Ovarian carcinoma, status post chemotherapy.  DISCHARGE DIAGNOSES: 1. Atrial fibrillation, status post amiodarone load. 2. Systemic anticoagulation. 3. Idiopathic dilated cardiomyopathy, presumed secondary to     Adriamycin, ejection fraction 40%. 4. Hypertension. 5. Premature ventricular contractions. 6. Colon carcinoma, status post resection. 7. Ovarian carcinoma. 8. Nonobstructive coronary artery disease with 30% diagonal 1.  PROCEDURES:  None.  HISTORY OF PRESENT ILLNESS:  This is a very pleasant 75 year old female who has a history of paroxysmal atrial fibrillation, hypertension, dilated cardiomyopathy, felt secondary to Adriamycin use from her ovarian and colon CA, who presents with episodes of shortness of breath and was found to have intermittent atrial fibrillation with RVR.  Her metoprolol was up-titrated, but she started feeling very fatigued on a higher dose of metoprolol.  She now presented for admission for amiodarone loading.  HOSPITAL COURSE:  The patient was started on amiodarone 400 mg b.i.d. She had only a few episodes of what appeared to be nonsustained atrial tachycardia for about five 5 or 6 beats at a time, but maintained sinus rhythm throughout the rest of the hospital stay.  She tolerated the amiodarone loading well without any complications.  She did have some issues with hypokalemia that was  repleted and her potassium dose was increased on the day of discharge.  On the day of discharge, she was in stable condition without any complaints.  She was subsequently discharged to home in stable condition.  LABS DURING HOSPITAL STAY:  On day of discharge, sodium 137, potassium 3.3, chloride 99, CO2 of 27, glucose 94, BUN 16, creatinine 0.84.  Of note, she was supplemented with 40 mEq of potassium additionally and a repeat potassium was obtained before discharge which is pending at this time of dictation.  INR 2.70.  White cell count 5.9, hemoglobin 13.4, hematocrit 39.2, platelet count 151.  TSH 1.328.  PFTs were done in the office prior to her being admitted and they were normal.  Chest x-ray showed no active cardiopulmonary disease.  DISCHARGE MEDICATIONS: 1. Amiodarone 200 mg, she is instructed to take 2 tablets twice daily     for 1 week, then decrease to 2 tablets daily for 1 week, then     decrease to 1 tablet daily. 2. Pravachol 40 mg daily. 3. Metoprolol tartrate 100 mg one-half tablet twice daily. 4. Potassium 20 mEq 2 tablets daily. 5. Warfarin was decreased to 1 mg one and a half tablets daily. 6. Aspirin 81 mg daily. 7. Coenzyme Q 10 daily. 8. Fish oil 1000 mg daily. 9. Furosemide 20 mg daily. 10.Ramipril 5 mg daily.  She was told to stop taking simvastatin 20 mg daily and Pravachol was given in replace  of the simvastatin due to the interaction of simvastatin with the amiodarone.  FOLLOWUP:  She has been instructed to follow up in my office on November 26, 2010, at 10:15 a.m. at which time we will also discuss getting her set up for transesophageal echo for further evaluation of her mitral regurgitation that was found be moderate-to-severe by recent echo in the office.  She will also follow up on Monday at 8:30 for a BMET in our office to make sure potassium is supplemented and also Coumadin Clinic on that day as well.     Armanda Magic, M.D.     TT/MEDQ   D:  11/11/2010  T:  11/11/2010  Job:  [id number]  Electronically Signed by Armanda Magic M.D. on 11/18/2010 08:53:29 AM

## 2010-11-18 NOTE — Discharge Summary (Signed)
  NAMEORIT, SANVILLE NO.:  [id number]  MEDICAL RECORD NO.:  [id number]  LOCATION:  2040                         FACILITY:  [facility]  PHYSICIAN:  Armanda Magic, M.D.     DATE OF BIRTH:  [birth year]  DATE OF ADMISSION:  11/08/2010 DATE OF DISCHARGE:                              DISCHARGE SUMMARY   Addendum  I have spent a total of 35 minutes in planning this patient's discharge including discussing discharge plans with the patient, writing the discharge note, preparing the patient's discharge medications, prescriptions and dictating this discharge.     Armanda Magic, M.D.     TT/MEDQ  D:  11/11/2010  T:  11/11/2010  Job:  [id number]  Electronically Signed by Armanda Magic M.D. on 11/18/2010 08:53:26 AM

## 2010-12-07 ENCOUNTER — Encounter: Payer: Self-pay | Admitting: Gastroenterology

## 2010-12-07 ENCOUNTER — Ambulatory Visit (AMBULATORY_SURGERY_CENTER): Payer: Medicare Other | Admitting: Gastroenterology

## 2010-12-07 DIAGNOSIS — D126 Benign neoplasm of colon, unspecified: Secondary | ICD-10-CM

## 2010-12-07 DIAGNOSIS — Z85038 Personal history of other malignant neoplasm of large intestine: Secondary | ICD-10-CM

## 2010-12-07 DIAGNOSIS — K635 Polyp of colon: Secondary | ICD-10-CM

## 2010-12-07 DIAGNOSIS — Z1211 Encounter for screening for malignant neoplasm of colon: Secondary | ICD-10-CM

## 2010-12-07 HISTORY — DX: Polyp of colon: K63.5

## 2010-12-07 MED ORDER — SODIUM CHLORIDE 0.9 % IV SOLN: 500.0000 mL | INTRAVENOUS | Status: DC

## 2010-12-07 NOTE — Patient Instructions (Signed)
RESUME COUMADIN TODAY HOLD ASPIRIN PRODUCTS FOR 2 WEEKS. CHECK PT/INR IN 1 WEEK. INFORMATION GIVEN ON POLYPS.

## 2010-12-08 ENCOUNTER — Telehealth: Payer: Self-pay | Admitting: *Deleted

## 2010-12-08 NOTE — Telephone Encounter (Signed)
Phone number rings busy with multiple attempts.

## 2010-12-13 ENCOUNTER — Encounter: Payer: Self-pay | Admitting: Gastroenterology

## 2011-03-09 ENCOUNTER — Telehealth: Payer: Self-pay | Admitting: *Deleted

## 2011-03-09 MED ORDER — ZOLPIDEM TARTRATE 10 MG PO TABS: 10.0000 mg | ORAL_TABLET | Freq: Every evening | ORAL | Status: DC | PRN

## 2011-03-09 NOTE — Telephone Encounter (Signed)
Done

## 2011-04-28 DIAGNOSIS — I4891 Unspecified atrial fibrillation: Secondary | ICD-10-CM | POA: Diagnosis not present

## 2011-04-28 DIAGNOSIS — Z7901 Long term (current) use of anticoagulants: Secondary | ICD-10-CM | POA: Diagnosis not present

## 2011-05-26 DIAGNOSIS — I4891 Unspecified atrial fibrillation: Secondary | ICD-10-CM | POA: Diagnosis not present

## 2011-05-26 DIAGNOSIS — Z7901 Long term (current) use of anticoagulants: Secondary | ICD-10-CM | POA: Diagnosis not present

## 2011-06-16 DIAGNOSIS — Z7901 Long term (current) use of anticoagulants: Secondary | ICD-10-CM | POA: Diagnosis not present

## 2011-06-16 DIAGNOSIS — I4891 Unspecified atrial fibrillation: Secondary | ICD-10-CM | POA: Diagnosis not present

## 2011-06-28 DIAGNOSIS — Z79899 Other long term (current) drug therapy: Secondary | ICD-10-CM | POA: Diagnosis not present

## 2011-06-28 DIAGNOSIS — E7800 Pure hypercholesterolemia, unspecified: Secondary | ICD-10-CM | POA: Diagnosis not present

## 2011-06-30 DIAGNOSIS — Z1231 Encounter for screening mammogram for malignant neoplasm of breast: Secondary | ICD-10-CM | POA: Diagnosis not present

## 2011-07-08 ENCOUNTER — Encounter: Payer: Self-pay | Admitting: Internal Medicine

## 2011-07-14 DIAGNOSIS — I4891 Unspecified atrial fibrillation: Secondary | ICD-10-CM | POA: Diagnosis not present

## 2011-07-14 DIAGNOSIS — Z7901 Long term (current) use of anticoagulants: Secondary | ICD-10-CM | POA: Diagnosis not present

## 2011-08-12 ENCOUNTER — Telehealth: Payer: Self-pay | Admitting: Hematology and Oncology

## 2011-08-12 NOTE — Telephone Encounter (Signed)
S/w pt today re appts for 5/1 and 5/3. Also confirmed 5/1 ct.

## 2011-08-17 ENCOUNTER — Ambulatory Visit (HOSPITAL_COMMUNITY)
Admission: RE | Admit: 2011-08-17 | Discharge: 2011-08-17 | Disposition: A | Discharge status: Home / Self Care | Payer: Medicare Other | Source: Ambulatory Visit | Attending: Hematology and Oncology | Admitting: Hematology and Oncology

## 2011-08-17 ENCOUNTER — Other Ambulatory Visit (HOSPITAL_BASED_OUTPATIENT_CLINIC_OR_DEPARTMENT_OTHER): Payer: Medicare Other | Admitting: Lab

## 2011-08-17 DIAGNOSIS — C189 Malignant neoplasm of colon, unspecified: Secondary | ICD-10-CM | POA: Diagnosis not present

## 2011-08-17 DIAGNOSIS — C569 Malignant neoplasm of unspecified ovary: Secondary | ICD-10-CM | POA: Diagnosis not present

## 2011-08-17 DIAGNOSIS — R918 Other nonspecific abnormal finding of lung field: Secondary | ICD-10-CM | POA: Insufficient documentation

## 2011-08-17 DIAGNOSIS — Z9049 Acquired absence of other specified parts of digestive tract: Secondary | ICD-10-CM | POA: Insufficient documentation

## 2011-08-17 DIAGNOSIS — Z9079 Acquired absence of other genital organ(s): Secondary | ICD-10-CM | POA: Insufficient documentation

## 2011-08-17 DIAGNOSIS — I700 Atherosclerosis of aorta: Secondary | ICD-10-CM | POA: Diagnosis not present

## 2011-08-17 DIAGNOSIS — I517 Cardiomegaly: Secondary | ICD-10-CM | POA: Diagnosis not present

## 2011-08-17 DIAGNOSIS — Z90710 Acquired absence of both cervix and uterus: Secondary | ICD-10-CM | POA: Insufficient documentation

## 2011-08-17 DIAGNOSIS — Z8543 Personal history of malignant neoplasm of ovary: Secondary | ICD-10-CM | POA: Diagnosis not present

## 2011-08-17 DIAGNOSIS — I2510 Atherosclerotic heart disease of native coronary artery without angina pectoris: Secondary | ICD-10-CM | POA: Insufficient documentation

## 2011-08-17 DIAGNOSIS — Z85038 Personal history of other malignant neoplasm of large intestine: Secondary | ICD-10-CM | POA: Diagnosis not present

## 2011-08-17 LAB — CBC WITH DIFFERENTIAL/PLATELET
BASO%: 0.7 % (ref 0.0–2.0)
Basophils Absolute: 0.1 10*3/uL (ref 0.0–0.1)
EOS%: 2.2 % (ref 0.0–7.0)
Eosinophils Absolute: 0.2 10*3/uL (ref 0.0–0.5)
HCT: 42.8 % (ref 34.8–46.6)
HGB: 14.2 g/dL (ref 11.6–15.9)
LYMPH%: 40.7 % (ref 14.0–49.7)
MCH: 31.2 pg (ref 25.1–34.0)
MCHC: 33.3 g/dL (ref 31.5–36.0)
MCV: 93.9 fL (ref 79.5–101.0)
MONO#: 0.8 10*3/uL (ref 0.1–0.9)
MONO%: 10.2 % (ref 0.0–14.0)
NEUT#: 3.7 10*3/uL (ref 1.5–6.5)
NEUT%: 46.2 % (ref 38.4–76.8)
Platelets: 153 10*3/uL (ref 145–400)
RBC: 4.56 10*6/uL (ref 3.70–5.45)
RDW: 13.8 % (ref 11.2–14.5)
WBC: 7.9 10*3/uL (ref 3.9–10.3)
lymph#: 3.2 10*3/uL (ref 0.9–3.3)
nRBC: 0 % (ref 0–0)

## 2011-08-17 LAB — CMP (CANCER CENTER ONLY)
ALT(SGPT): 64 U/L — ABNORMAL HIGH (ref 10–47)
AST: 65 U/L — ABNORMAL HIGH (ref 11–38)
Albumin: 3.8 g/dL (ref 3.3–5.5)
Alkaline Phosphatase: 82 U/L (ref 26–84)
BUN, Bld: 14 mg/dL (ref 7–22)
CO2: 28 mEq/L (ref 18–33)
Calcium: 8.6 mg/dL (ref 8.0–10.3)
Chloride: 94 mEq/L — ABNORMAL LOW (ref 98–108)
Creat: 1 mg/dl (ref 0.6–1.2)
Glucose, Bld: 110 mg/dL (ref 73–118)
Potassium: 4.2 mEq/L (ref 3.3–4.7)
Sodium: 141 mEq/L (ref 128–145)
Total Bilirubin: 1 mg/dl (ref 0.20–1.60)
Total Protein: 7.7 g/dL (ref 6.4–8.1)

## 2011-08-17 LAB — CEA: CEA: 0.7 ng/mL (ref 0.0–5.0)

## 2011-08-17 MED ORDER — IOHEXOL 300 MG/ML  SOLN
100.0000 mL | Freq: Once | INTRAMUSCULAR | Status: AC | PRN
  Administered 2011-08-17: 100 mL via INTRAVENOUS

## 2011-08-18 DIAGNOSIS — Z7901 Long term (current) use of anticoagulants: Secondary | ICD-10-CM | POA: Diagnosis not present

## 2011-08-18 DIAGNOSIS — I4891 Unspecified atrial fibrillation: Secondary | ICD-10-CM | POA: Diagnosis not present

## 2011-08-19 ENCOUNTER — Ambulatory Visit (HOSPITAL_BASED_OUTPATIENT_CLINIC_OR_DEPARTMENT_OTHER): Payer: Medicare Other | Admitting: Hematology and Oncology

## 2011-08-19 ENCOUNTER — Encounter: Payer: Self-pay | Admitting: Hematology and Oncology

## 2011-08-19 VITALS — BP 129/81 | HR 58 | Temp 96.8°F | Ht 65.0 in | Wt 149.9 lb

## 2011-08-19 DIAGNOSIS — Z85038 Personal history of other malignant neoplasm of large intestine: Secondary | ICD-10-CM | POA: Diagnosis not present

## 2011-08-19 DIAGNOSIS — C189 Malignant neoplasm of colon, unspecified: Secondary | ICD-10-CM

## 2011-08-19 NOTE — Progress Notes (Signed)
This office note has been dictated.

## 2011-08-19 NOTE — Progress Notes (Signed)
CC:   Rosalyn Gess. Norins, MD Armanda Magic, M.D. Angelia Mould. Derrell Lolling, M.D. Venita Lick. Russella Dar, MD, [facility]  REFERRING PHYSICIAN:  Venita Lick. Russella Dar, MD, [facility]  DIAGNOSIS:  Stage IIA colon cancer.  INTENT:  Surveillance.  DRUGS/REGIMEN GIVEN:  None.  BRIEF SUMMARY:  Mrs. Skillman is a 76 year old woman who was diagnosed with colon cancer in 2007.  On 11/11/2005 Dr. Derrell Lolling performed a right colectomy with pathology revealing a T3 N0 M0 (0/42 lymph nodes) moderately to poorly differentiated adenocarcinoma of the colon. Adjuvant chemotherapy was not recommended.  She is undergoing surveillance with clinic visits, blood work and radiographic studies. She receives colonoscopy every 2 years with Dr. Russella Dar.  She was last seen here a year ago.  Has no current complaints or concerns.  Her last colonoscopy was performed a year ago which she reports as being unremarkable.  A recent exam CT scan of the chest, abdomen and pelvis on 08/17/2011 showed no evidence of recurrent or metastatic disease.  The subpleural pulmonary nodules were favored to represent benign reactive subpleural lymph nodes.  PHYSICAL EXAMINATION:  Patient is alert and oriented x3.  Vitals:  Pulse 58, blood pressure 129/81, temperature 96.8, respirations 20, weight 149.9 pounds.  HEENT:  Head is atraumatic, normocephalic.  Sclerae anicteric.  Mouth moist.  Chest:  Clear.  Abdomen:  Soft, nontender. Bowel sounds present.  Extremities:  No calf tenderness.  Lymph nodes: No adenopathy.  LAB DATA:  08/17/2011 white cell count 7.9, hemoglobin 14.2, hematocrit 42.8, platelets 153.  Sodium 141, potassium 4.2, chloride 94, CO2 28, BUN 14, creatinine 1, glucose 110.  T bilirubin 1, alkaline phosphatase 82, AST 55, ALT 54, calcium 8.6.  CEA 0.7.  ECOG:  0.  FOLLOWUP PLANS: 1. The patient will be discharged from Oncology. 2. Recommend annual physicals with patient's primary care Dr. Debby Bud. 3. From Oncology standpoint, would recommend  routine CBC, CMET and     annual CEAs. 4. Follow up with Dr. Russella Dar for GI surveillance per his     recommendations. 5. Follow up - patient follows up as needed.    ______________________________ Laurice Record, M.D. LIO/MEDQ  D:  08/19/2011  T:  08/19/2011  Job:  [id number]

## 2011-08-19 NOTE — Patient Instructions (Signed)
Cynthia Frank  [id number]  [facility] Discharge Instructions  RECOMMENDATIONS MADE BY THE CONSULTANT AND ANY TEST RESULTS WILL BE SENT TO YOUR REFERRING DOCTOR.   EXAM FINDINGS BY MD TODAY AND SIGNS AND SYMPTOMS TO REPORT TO CLINIC OR PRIMARY MD:   Your current list of medications are: Current Outpatient Prescriptions  Medication Sig Dispense Refill  . amiodarone (PACERONE) 200 MG tablet Take 200 mg by mouth daily.       Marland Kitchen aspirin 81 MG tablet Take 81 mg by mouth daily.       Marland Kitchen atorvastatin (LIPITOR) 10 MG tablet Take 10 mg by mouth daily.      . Coenzyme Q10 (COQ10) 100 MG CAPS Take 1 capsule by mouth daily.       . furosemide (LASIX) 20 MG tablet Take 20 mg by mouth daily.       . metoprolol (TOPROL-XL) 100 MG 24 hr tablet Take 100 mg by mouth 2 (two) times daily.        . MULTIPLE VITAMINS PO Take by mouth daily.        . Omega-3 Fatty Acids (FISH OIL) 1000 MG CAPS Take 2,000 capsules by mouth daily.       . potassium chloride SA (K-DUR,KLOR-CON) 20 MEQ tablet Take 40 mEq by mouth daily.       . ramipril (ALTACE) 10 MG capsule Take 5 mg by mouth daily.       Marland Kitchen tretinoin (RETIN-A) 0.1 % cream Apply topically at bedtime.        Marland Kitchen warfarin (COUMADIN) 2.5 MG tablet Take one tab as directed.      . warfarin (COUMADIN) 5 MG tablet Take one tablet by mouth as directed.      . zolpidem (AMBIEN) 10 MG tablet Take 5 mg by mouth at bedtime as needed.      Marland Kitchen DISCONTD: zolpidem (AMBIEN) 10 MG tablet Take 1 tablet (10 mg total) by mouth at bedtime as needed.  90 tablet  1   Current Facility-Administered Medications  Medication Dose Route Frequency Provider Last Rate Last Dose  . DISCONTD: 0.9 %  sodium chloride infusion  500 mL Intravenous Continuous Meryl Dare, MD,FACG         INSTRUCTIONS GIVEN AND DISCUSSED:   SPECIAL INSTRUCTIONS/FOLLOW-UP:  See above.  I acknowledge that I have been informed and understand all the instructions given to me and received a copy.  I do not have any more questions at this time, but understand that I may call the [facility] at [phone] during business hours should I have any further questions or need assistance in obtaining follow-up care.

## 2011-08-26 ENCOUNTER — Ambulatory Visit: Payer: Medicare Other | Admitting: Hematology and Oncology

## 2011-09-09 DIAGNOSIS — J209 Acute bronchitis, unspecified: Secondary | ICD-10-CM | POA: Diagnosis not present

## 2011-09-09 DIAGNOSIS — I10 Essential (primary) hypertension: Secondary | ICD-10-CM | POA: Diagnosis not present

## 2011-09-09 DIAGNOSIS — I4891 Unspecified atrial fibrillation: Secondary | ICD-10-CM | POA: Diagnosis not present

## 2011-09-10 DIAGNOSIS — S6990XA Unspecified injury of unspecified wrist, hand and finger(s), initial encounter: Secondary | ICD-10-CM | POA: Diagnosis not present

## 2011-09-10 DIAGNOSIS — S62109A Fracture of unspecified carpal bone, unspecified wrist, initial encounter for closed fracture: Secondary | ICD-10-CM | POA: Diagnosis not present

## 2011-09-10 DIAGNOSIS — S62113A Displaced fracture of triquetrum [cuneiform] bone, unspecified wrist, initial encounter for closed fracture: Secondary | ICD-10-CM | POA: Diagnosis not present

## 2011-09-10 DIAGNOSIS — S59909A Unspecified injury of unspecified elbow, initial encounter: Secondary | ICD-10-CM | POA: Diagnosis not present

## 2011-09-16 ENCOUNTER — Other Ambulatory Visit: Payer: Self-pay | Admitting: *Deleted

## 2011-09-16 MED ORDER — ZOLPIDEM TARTRATE 10 MG PO TABS: 5.0000 mg | ORAL_TABLET | Freq: Every evening | ORAL | Status: DC | PRN

## 2011-09-16 NOTE — Telephone Encounter (Signed)
Rx for Zolpidem printed to be signed by Dr. Debby Bud

## 2011-09-19 ENCOUNTER — Telehealth: Payer: Self-pay | Admitting: *Deleted

## 2011-09-19 NOTE — Telephone Encounter (Signed)
Rx called to  [facility] pharmacy. Patient notified.

## 2011-09-27 DIAGNOSIS — S62109A Fracture of unspecified carpal bone, unspecified wrist, initial encounter for closed fracture: Secondary | ICD-10-CM | POA: Diagnosis not present

## 2011-09-30 DIAGNOSIS — I10 Essential (primary) hypertension: Secondary | ICD-10-CM | POA: Diagnosis not present

## 2011-09-30 DIAGNOSIS — I4891 Unspecified atrial fibrillation: Secondary | ICD-10-CM | POA: Diagnosis not present

## 2011-09-30 DIAGNOSIS — Z7901 Long term (current) use of anticoagulants: Secondary | ICD-10-CM | POA: Diagnosis not present

## 2011-09-30 DIAGNOSIS — Z79899 Other long term (current) drug therapy: Secondary | ICD-10-CM | POA: Diagnosis not present

## 2011-09-30 DIAGNOSIS — E7800 Pure hypercholesterolemia, unspecified: Secondary | ICD-10-CM | POA: Diagnosis not present

## 2011-09-30 DIAGNOSIS — I428 Other cardiomyopathies: Secondary | ICD-10-CM | POA: Diagnosis not present

## 2011-10-14 DIAGNOSIS — M7989 Other specified soft tissue disorders: Secondary | ICD-10-CM | POA: Diagnosis not present

## 2011-10-14 DIAGNOSIS — S82209A Unspecified fracture of shaft of unspecified tibia, initial encounter for closed fracture: Secondary | ICD-10-CM | POA: Diagnosis not present

## 2011-10-14 DIAGNOSIS — Z01818 Encounter for other preprocedural examination: Secondary | ICD-10-CM | POA: Diagnosis not present

## 2011-10-14 DIAGNOSIS — I10 Essential (primary) hypertension: Secondary | ICD-10-CM | POA: Diagnosis not present

## 2011-10-14 DIAGNOSIS — M79609 Pain in unspecified limb: Secondary | ICD-10-CM | POA: Diagnosis not present

## 2011-10-14 DIAGNOSIS — Z01811 Encounter for preprocedural respiratory examination: Secondary | ICD-10-CM | POA: Diagnosis not present

## 2011-10-14 DIAGNOSIS — E876 Hypokalemia: Secondary | ICD-10-CM | POA: Diagnosis present

## 2011-10-14 DIAGNOSIS — S82899A Other fracture of unspecified lower leg, initial encounter for closed fracture: Secondary | ICD-10-CM | POA: Diagnosis not present

## 2011-10-14 DIAGNOSIS — I4891 Unspecified atrial fibrillation: Secondary | ICD-10-CM | POA: Diagnosis not present

## 2011-10-14 DIAGNOSIS — S82853A Displaced trimalleolar fracture of unspecified lower leg, initial encounter for closed fracture: Secondary | ICD-10-CM | POA: Diagnosis not present

## 2011-10-14 DIAGNOSIS — Z7901 Long term (current) use of anticoagulants: Secondary | ICD-10-CM | POA: Diagnosis not present

## 2011-10-14 DIAGNOSIS — S93306A Unspecified dislocation of unspecified foot, initial encounter: Secondary | ICD-10-CM | POA: Diagnosis not present

## 2011-10-14 DIAGNOSIS — M25579 Pain in unspecified ankle and joints of unspecified foot: Secondary | ICD-10-CM | POA: Diagnosis not present

## 2011-10-14 DIAGNOSIS — T148XXA Other injury of unspecified body region, initial encounter: Secondary | ICD-10-CM | POA: Diagnosis not present

## 2011-10-14 DIAGNOSIS — E871 Hypo-osmolality and hyponatremia: Secondary | ICD-10-CM | POA: Diagnosis not present

## 2011-10-14 DIAGNOSIS — I428 Other cardiomyopathies: Secondary | ICD-10-CM | POA: Diagnosis not present

## 2011-10-14 DIAGNOSIS — M2010 Hallux valgus (acquired), unspecified foot: Secondary | ICD-10-CM | POA: Diagnosis not present

## 2011-10-14 DIAGNOSIS — S82409A Unspecified fracture of shaft of unspecified fibula, initial encounter for closed fracture: Secondary | ICD-10-CM | POA: Diagnosis not present

## 2011-10-14 DIAGNOSIS — M7730 Calcaneal spur, unspecified foot: Secondary | ICD-10-CM | POA: Diagnosis not present

## 2011-10-15 ENCOUNTER — Inpatient Hospital Stay (HOSPITAL_COMMUNITY)
Admission: AD | Admit: 2011-10-15 | Discharge: 2011-10-21 | DRG: 493 | Disposition: A | Discharge status: Skilled Nursing Facility | Payer: Medicare Other | Source: Ambulatory Visit | Attending: Orthopedic Surgery | Admitting: Orthopedic Surgery

## 2011-10-15 ENCOUNTER — Inpatient Hospital Stay (HOSPITAL_COMMUNITY): Payer: Medicare Other

## 2011-10-15 ENCOUNTER — Encounter (HOSPITAL_COMMUNITY): Payer: Self-pay | Admitting: *Deleted

## 2011-10-15 DIAGNOSIS — S82843A Displaced bimalleolar fracture of unspecified lower leg, initial encounter for closed fracture: Secondary | ICD-10-CM

## 2011-10-15 DIAGNOSIS — Z7901 Long term (current) use of anticoagulants: Secondary | ICD-10-CM | POA: Diagnosis not present

## 2011-10-15 DIAGNOSIS — S82853A Displaced trimalleolar fracture of unspecified lower leg, initial encounter for closed fracture: Principal | ICD-10-CM | POA: Diagnosis present

## 2011-10-15 DIAGNOSIS — I428 Other cardiomyopathies: Secondary | ICD-10-CM | POA: Diagnosis not present

## 2011-10-15 DIAGNOSIS — Z85038 Personal history of other malignant neoplasm of large intestine: Secondary | ICD-10-CM

## 2011-10-15 DIAGNOSIS — N390 Urinary tract infection, site not specified: Secondary | ICD-10-CM | POA: Diagnosis not present

## 2011-10-15 DIAGNOSIS — I4891 Unspecified atrial fibrillation: Secondary | ICD-10-CM | POA: Diagnosis not present

## 2011-10-15 DIAGNOSIS — W108XXA Fall (on) (from) other stairs and steps, initial encounter: Secondary | ICD-10-CM | POA: Diagnosis present

## 2011-10-15 DIAGNOSIS — Z4789 Encounter for other orthopedic aftercare: Secondary | ICD-10-CM | POA: Diagnosis not present

## 2011-10-15 DIAGNOSIS — E785 Hyperlipidemia, unspecified: Secondary | ICD-10-CM | POA: Diagnosis not present

## 2011-10-15 DIAGNOSIS — IMO0002 Reserved for concepts with insufficient information to code with codable children: Secondary | ICD-10-CM | POA: Diagnosis not present

## 2011-10-15 DIAGNOSIS — G4700 Insomnia, unspecified: Secondary | ICD-10-CM | POA: Diagnosis not present

## 2011-10-15 DIAGNOSIS — S99929A Unspecified injury of unspecified foot, initial encounter: Secondary | ICD-10-CM | POA: Diagnosis not present

## 2011-10-15 DIAGNOSIS — Z5189 Encounter for other specified aftercare: Secondary | ICD-10-CM | POA: Diagnosis not present

## 2011-10-15 DIAGNOSIS — Y92009 Unspecified place in unspecified non-institutional (private) residence as the place of occurrence of the external cause: Secondary | ICD-10-CM

## 2011-10-15 DIAGNOSIS — S82409A Unspecified fracture of shaft of unspecified fibula, initial encounter for closed fracture: Secondary | ICD-10-CM | POA: Diagnosis not present

## 2011-10-15 DIAGNOSIS — I10 Essential (primary) hypertension: Secondary | ICD-10-CM | POA: Diagnosis present

## 2011-10-15 DIAGNOSIS — IMO0001 Reserved for inherently not codable concepts without codable children: Secondary | ICD-10-CM | POA: Diagnosis not present

## 2011-10-15 DIAGNOSIS — S8990XA Unspecified injury of unspecified lower leg, initial encounter: Secondary | ICD-10-CM | POA: Diagnosis not present

## 2011-10-15 DIAGNOSIS — S8253XA Displaced fracture of medial malleolus of unspecified tibia, initial encounter for closed fracture: Secondary | ICD-10-CM | POA: Diagnosis not present

## 2011-10-15 DIAGNOSIS — S82899A Other fracture of unspecified lower leg, initial encounter for closed fracture: Secondary | ICD-10-CM | POA: Diagnosis not present

## 2011-10-15 DIAGNOSIS — Z9181 History of falling: Secondary | ICD-10-CM | POA: Diagnosis not present

## 2011-10-15 LAB — URINALYSIS, ROUTINE W REFLEX MICROSCOPIC
Bilirubin Urine: NEGATIVE
Glucose, UA: NEGATIVE mg/dL
Hgb urine dipstick: NEGATIVE
Ketones, ur: NEGATIVE mg/dL
Nitrite: NEGATIVE
Protein, ur: NEGATIVE mg/dL
Specific Gravity, Urine: 1.021 (ref 1.005–1.030)
Urobilinogen, UA: 0.2 mg/dL (ref 0.0–1.0)
pH: 6.5 (ref 5.0–8.0)

## 2011-10-15 LAB — URINE MICROSCOPIC-ADD ON

## 2011-10-15 LAB — SURGICAL PCR SCREEN
MRSA, PCR: POSITIVE — AB
Staphylococcus aureus: POSITIVE — AB

## 2011-10-15 LAB — CBC
HCT: 37.2 % (ref 36.0–46.0)
Hemoglobin: 12.7 g/dL (ref 12.0–15.0)
MCH: 30.8 pg (ref 26.0–34.0)
MCHC: 34.1 g/dL (ref 30.0–36.0)
MCV: 90.1 fL (ref 78.0–100.0)
Platelets: 137 10*3/uL — ABNORMAL LOW (ref 150–400)
RBC: 4.13 MIL/uL (ref 3.87–5.11)
RDW: 13.7 % (ref 11.5–15.5)
WBC: 10.8 10*3/uL — ABNORMAL HIGH (ref 4.0–10.5)

## 2011-10-15 LAB — COMPREHENSIVE METABOLIC PANEL
ALT: 41 U/L — ABNORMAL HIGH (ref 0–35)
AST: 42 U/L — ABNORMAL HIGH (ref 0–37)
Albumin: 3.6 g/dL (ref 3.5–5.2)
Alkaline Phosphatase: 71 U/L (ref 39–117)
BUN: 11 mg/dL (ref 6–23)
CO2: 24 mEq/L (ref 19–32)
Calcium: 8.3 mg/dL — ABNORMAL LOW (ref 8.4–10.5)
Chloride: 94 mEq/L — ABNORMAL LOW (ref 96–112)
Creatinine, Ser: 0.62 mg/dL (ref 0.50–1.10)
GFR calc Af Amer: >90 mL/min (ref >90)
GFR calc non Af Amer: 87 mL/min — ABNORMAL LOW (ref >90)
Glucose, Bld: 126 mg/dL — ABNORMAL HIGH (ref 70–99)
Potassium: 3.6 mEq/L (ref 3.5–5.1)
Sodium: 131 mEq/L — ABNORMAL LOW (ref 135–145)
Total Bilirubin: 1.1 mg/dL (ref 0.3–1.2)
Total Protein: 6.9 g/dL (ref 6.0–8.3)

## 2011-10-15 LAB — DIFFERENTIAL
Basophils Absolute: 0 10*3/uL (ref 0.0–0.1)
Basophils Relative: 0 % (ref 0–1)
Eosinophils Absolute: 0 10*3/uL (ref 0.0–0.7)
Eosinophils Relative: 0 % (ref 0–5)
Lymphocytes Relative: 10 % — ABNORMAL LOW (ref 12–46)
Lymphs Abs: 1.1 10*3/uL (ref 0.7–4.0)
Monocytes Absolute: 1.1 10*3/uL — ABNORMAL HIGH (ref 0.1–1.0)
Monocytes Relative: 10 % (ref 3–12)
Neutro Abs: 8.6 10*3/uL — ABNORMAL HIGH (ref 1.7–7.7)
Neutrophils Relative %: 80 % — ABNORMAL HIGH (ref 43–77)

## 2011-10-15 LAB — PROTIME-INR
INR: 2.44 — ABNORMAL HIGH (ref 0.00–1.49)
Prothrombin Time: 26.9 seconds — ABNORMAL HIGH (ref 11.6–15.2)

## 2011-10-15 LAB — APTT: aPTT: 55 seconds — ABNORMAL HIGH (ref 24–37)

## 2011-10-15 MED ORDER — ZOLPIDEM TARTRATE 5 MG PO TABS
5.0000 mg | ORAL_TABLET | Freq: Every evening | ORAL | Status: DC | PRN
  Administered 2011-10-17 – 2011-10-20 (×3): 5 mg via ORAL
  Filled 2011-10-15 (×3): qty 1

## 2011-10-15 MED ORDER — RAMIPRIL 5 MG PO CAPS
5.0000 mg | ORAL_CAPSULE | Freq: Every day | ORAL | Status: DC
  Administered 2011-10-15 – 2011-10-16 (×2): 5 mg via ORAL
  Filled 2011-10-15 (×2): qty 1

## 2011-10-15 MED ORDER — VITAMIN K1 10 MG/ML IJ SOLN
10.0000 mg | Freq: Once | INTRAVENOUS | Status: AC
  Administered 2011-10-15: 10 mg via INTRAVENOUS
  Filled 2011-10-15: qty 1

## 2011-10-15 MED ORDER — ONDANSETRON HCL 4 MG PO TABS: 4.0000 mg | ORAL_TABLET | Freq: Four times a day (QID) | ORAL | Status: DC | PRN

## 2011-10-15 MED ORDER — ACETAMINOPHEN 650 MG RE SUPP: 650.0000 mg | Freq: Four times a day (QID) | RECTAL | Status: DC | PRN

## 2011-10-15 MED ORDER — ADULT MULTIVITAMIN W/MINERALS CH
1.0000 | ORAL_TABLET | Freq: Every day | ORAL | Status: DC
  Filled 2011-10-15: qty 1

## 2011-10-15 MED ORDER — POLYETHYLENE GLYCOL 3350 17 G PO PACK: 17.0000 g | PACK | Freq: Every day | ORAL | Status: DC | PRN

## 2011-10-15 MED ORDER — LIDOCAINE HCL 1 % IJ SOLN
20.0000 mL | Freq: Once | INTRAMUSCULAR | Status: AC
  Administered 2011-10-15: 20 mL via INTRADERMAL
  Filled 2011-10-15 (×2): qty 20

## 2011-10-15 MED ORDER — AMIODARONE HCL 200 MG PO TABS
200.0000 mg | ORAL_TABLET | Freq: Every day | ORAL | Status: DC
  Administered 2011-10-15: 200 mg via ORAL
  Filled 2011-10-15 (×2): qty 1

## 2011-10-15 MED ORDER — HYDROCODONE-ACETAMINOPHEN 5-325 MG PO TABS: 1.0000 | ORAL_TABLET | ORAL | Status: DC | PRN

## 2011-10-15 MED ORDER — OMEGA-3-ACID ETHYL ESTERS 1 G PO CAPS
1.0000 g | ORAL_CAPSULE | Freq: Two times a day (BID) | ORAL | Status: DC
  Administered 2011-10-16: 1 g via ORAL
  Filled 2011-10-15 (×3): qty 1

## 2011-10-15 MED ORDER — METOPROLOL SUCCINATE ER 100 MG PO TB24
100.0000 mg | ORAL_TABLET | Freq: Two times a day (BID) | ORAL | Status: DC
  Administered 2011-10-15 – 2011-10-17 (×4): 100 mg via ORAL
  Filled 2011-10-15 (×5): qty 1

## 2011-10-15 MED ORDER — OXYCODONE-ACETAMINOPHEN 5-325 MG PO TABS
ORAL_TABLET | ORAL | Status: AC
  Administered 2011-10-15: 2 via ORAL
  Filled 2011-10-15: qty 2

## 2011-10-15 MED ORDER — POVIDONE-IODINE 10 % EX SOLN
Freq: Once | CUTANEOUS | Status: AC
  Administered 2011-10-15: 1 via TOPICAL
  Filled 2011-10-15: qty 118

## 2011-10-15 MED ORDER — ACETAMINOPHEN 325 MG PO TABS: 650.0000 mg | ORAL_TABLET | Freq: Four times a day (QID) | ORAL | Status: DC | PRN

## 2011-10-15 MED ORDER — CHLORHEXIDINE GLUCONATE CLOTH 2 % EX PADS: 6.0000 | MEDICATED_PAD | Freq: Every day | CUTANEOUS | Status: DC

## 2011-10-15 MED ORDER — ENOXAPARIN SODIUM 40 MG/0.4ML ~~LOC~~ SOLN
40.0000 mg | SUBCUTANEOUS | Status: DC
  Filled 2011-10-15 (×2): qty 0.4

## 2011-10-15 MED ORDER — HYDROMORPHONE HCL PF 1 MG/ML IJ SOLN
0.5000 mg | INTRAMUSCULAR | Status: DC | PRN
  Administered 2011-10-15: 1 mg via INTRAVENOUS

## 2011-10-15 MED ORDER — ATORVASTATIN CALCIUM 10 MG PO TABS
10.0000 mg | ORAL_TABLET | Freq: Every day | ORAL | Status: DC
  Administered 2011-10-15 – 2011-10-20 (×6): 10 mg via ORAL
  Filled 2011-10-15 (×7): qty 1

## 2011-10-15 MED ORDER — COQ10 100 MG PO CAPS: 1.0000 | ORAL_CAPSULE | Freq: Every day | ORAL | Status: DC

## 2011-10-15 MED ORDER — OXYCODONE-ACETAMINOPHEN 5-325 MG PO TABS
1.0000 | ORAL_TABLET | ORAL | Status: DC | PRN
  Administered 2011-10-15 (×2): 2 via ORAL
  Filled 2011-10-15: qty 2

## 2011-10-15 MED ORDER — TRETINOIN 0.1 % EX CREA
TOPICAL_CREAM | Freq: Every day | CUTANEOUS | Status: DC
  Administered 2011-10-19: 22:00:00 via TOPICAL

## 2011-10-15 MED ORDER — FUROSEMIDE 20 MG PO TABS
20.0000 mg | ORAL_TABLET | Freq: Every day | ORAL | Status: DC
  Administered 2011-10-16 – 2011-10-21 (×5): 20 mg via ORAL
  Filled 2011-10-15 (×7): qty 1

## 2011-10-15 MED ORDER — FLEET ENEMA 7-19 GM/118ML RE ENEM: 1.0000 | ENEMA | Freq: Once | RECTAL | Status: AC | PRN

## 2011-10-15 MED ORDER — BISACODYL 10 MG RE SUPP: 10.0000 mg | Freq: Every day | RECTAL | Status: DC | PRN

## 2011-10-15 MED ORDER — SODIUM CHLORIDE 0.9 % IJ SOLN: 3.0000 mL | INTRAMUSCULAR | Status: DC | PRN

## 2011-10-15 MED ORDER — LACTATED RINGERS IV SOLN
INTRAVENOUS | Status: DC
  Administered 2011-10-15: 22:00:00 via INTRAVENOUS

## 2011-10-15 MED ORDER — LIDOCAINE HCL 1 % IJ SOLN
20.0000 mL | Freq: Once | INTRAMUSCULAR | Status: AC
  Administered 2011-10-15: 20 mL via INTRADERMAL

## 2011-10-15 MED ORDER — SODIUM CHLORIDE 0.9 % IJ SOLN: 3.0000 mL | Freq: Two times a day (BID) | INTRAMUSCULAR | Status: DC

## 2011-10-15 MED ORDER — ONDANSETRON HCL 4 MG/2ML IJ SOLN: 4.0000 mg | Freq: Four times a day (QID) | INTRAMUSCULAR | Status: DC | PRN

## 2011-10-15 MED ORDER — SODIUM CHLORIDE 0.9 % IV SOLN: 250.0000 mL | INTRAVENOUS | Status: DC | PRN

## 2011-10-15 MED ORDER — MUPIROCIN 2 % EX OINT
1.0000 "application " | TOPICAL_OINTMENT | Freq: Two times a day (BID) | CUTANEOUS | Status: AC
  Administered 2011-10-15 – 2011-10-20 (×10): 1 via NASAL
  Filled 2011-10-15 (×2): qty 22

## 2011-10-15 MED ORDER — POTASSIUM CHLORIDE CRYS ER 20 MEQ PO TBCR
40.0000 meq | EXTENDED_RELEASE_TABLET | Freq: Every day | ORAL | Status: DC
  Administered 2011-10-15 – 2011-10-21 (×6): 40 meq via ORAL
  Filled 2011-10-15 (×7): qty 2

## 2011-10-15 NOTE — H&P (Signed)
Cynthia Frank is an 76 y.o. female.   Chief Complaint: left ankle pain HPI: Cynthia Frank is a 76 year old female who fractured her left ankle last night. She was at [facility], [place] on vacation when she feel on some stairs at her beach house and rolled her left ankle. She is was admitted at [facility] for this problem. X-rays were performed which showed trimalleolar fracture of the left ankle. She had the ankle placed in a posterior splint. She was seen by the cardiologist there and he recommended an echo before surgery. She is from [place], [place] and wanted to come back to have the ankle fixed. She was transported to [facility] by her husband.    Past Medical History  Diagnosis Date  . Hx: UTI (urinary tract infection)   . Nonischemic cardiomyopathy     chemo related  . History of ovarian cancer 1985  . Hypertension   . Partial bowel obstruction   . Internal hemorrhoid   . Colon cancer 03/2006    T3, N0  . Atrial fibrillation   She has a history of atrial fibrillation. She is on Coumadin for this. She was diagnosed with ovarian cancer in the 1980s and underwent hysterectomy without difficulty. She also was diagnosed with colon cancer, with her last surgery about 5-6 years ago. She has been released from her oncologist.   Past Surgical History  Procedure Date  . Abdominal hysterectomy   . Oophorectomy     '61  . Tonsillectomy     '44  . Resection of right ovarian cancer '84, repeat '86   . Laparotomy     '80 adhesions  . Resection due to partial bowel obstruction     1985  . Colon surgery     for colon cancer in 2007  . Laparotomy for takedown of intestinal obstruction secondary to adhesions     1989    Family History  Problem Relation Age of Onset  . Uterine cancer Mother   . Prostate cancer Father   . Heart disease Father   . Colon cancer Neg Hx    Social History:  reports that she quit smoking about 33 years ago. Her smoking use included  Cigarettes. She has a 10 pack-year smoking history. She does not have any smokeless tobacco history on file. She reports that she drinks about 7 ounces of alcohol per week. She reports that she does not use illicit drugs.  Allergies:  Allergies  Allergen Reactions  . Clindamycin/Lincomycin     itching  . Sulfonamide Derivatives     REACTION: Itching/Swelling    Medications Prior to Admission  Medication Sig Dispense Refill  . amiodarone (PACERONE) 200 MG tablet Take 200 mg by mouth daily.       Marland Kitchen aspirin 81 MG tablet Take 81 mg by mouth daily.       Marland Kitchen atorvastatin (LIPITOR) 10 MG tablet Take 10 mg by mouth daily.      . Coenzyme Q10 (COQ10) 100 MG CAPS Take 1 capsule by mouth daily.       . furosemide (LASIX) 20 MG tablet Take 20 mg by mouth daily.       . metoprolol (TOPROL-XL) 100 MG 24 hr tablet Take 100 mg by mouth 2 (two) times daily.        . MULTIPLE VITAMINS PO Take by mouth daily.        . Omega-3 Fatty Acids (FISH OIL) 1000 MG CAPS Take 2,000  capsules by mouth daily.       . potassium chloride SA (K-DUR,KLOR-CON) 20 MEQ tablet Take 40 mEq by mouth daily.       . ramipril (ALTACE) 10 MG capsule Take 5 mg by mouth daily.       Marland Kitchen tretinoin (RETIN-A) 0.1 % cream Apply topically at bedtime.        Marland Kitchen warfarin (COUMADIN) 2.5 MG tablet Take one tab as directed.      . warfarin (COUMADIN) 5 MG tablet Take one tablet by mouth as directed.      . zolpidem (AMBIEN) 10 MG tablet Take 0.5 tablets (5 mg total) by mouth at bedtime as needed.  30 tablet  0     Review of Systems  Constitutional: Negative.   HENT: Negative.  Negative for neck pain.   Eyes: Negative.   Respiratory: Negative.   Cardiovascular: Negative.   Gastrointestinal: Negative.   Genitourinary: Negative.   Musculoskeletal: Positive for joint pain and falls. Negative for myalgias and back pain.  Skin: Negative.   Neurological: Negative.   Endo/Heme/Allergies: Negative for environmental allergies and polydipsia.  Bruises/bleeds easily.  Psychiatric/Behavioral: Negative.     Blood pressure 115/70, pulse 56, temperature 97.8 F (36.6 C), temperature source Oral, resp. rate 16, height 5\' 5"  (1.651 m), weight 68.04 kg (150 lb), SpO2 96.00%. Physical Exam  Constitutional: She is oriented to person, place, and time. She appears well-developed and well-nourished. No distress.  HENT:  Head: Normocephalic and atraumatic.  Right Ear: External ear normal.  Left Ear: External ear normal.  Nose: Nose normal.  Mouth/Throat: Oropharynx is clear and moist.  Eyes: Conjunctivae and EOM are normal. Right eye exhibits no discharge. Left eye exhibits no discharge.  Neck: Normal range of motion. Neck supple. No tracheal deviation present. No thyromegaly present.  Cardiovascular: Normal rate, regular rhythm, normal heart sounds and intact distal pulses.   No murmur heard. Respiratory: Effort normal and breath sounds normal. No respiratory distress. She has no wheezes. She has no rales. She exhibits no tenderness.  GI: Soft. Bowel sounds are normal. She exhibits no distension and no mass. There is no tenderness. There is no rebound and no guarding.  Musculoskeletal:       Right wrist: She exhibits normal range of motion, no tenderness and no swelling.       Right ankle: She exhibits swelling and ecchymosis. She exhibits normal range of motion and no deformity. tenderness. Lateral malleolus tenderness found. No medial malleolus tenderness found.       Left ankle: She exhibits decreased range of motion, swelling and ecchymosis. tenderness. Lateral malleolus and medial malleolus tenderness found.       Arms:      Right lower leg: She exhibits no tenderness.       Left lower leg: She exhibits no tenderness.  Neurological: She is alert and oriented to person, place, and time. She exhibits normal muscle tone.  Skin: Skin is intact. Bruising and ecchymosis noted. She is not diaphoretic.     Psychiatric: She has a normal mood  and affect. Her behavior is normal. Judgment and thought content normal.     Assessment/Plan We are going to repeat x-rays of the left ankle as the x-rays were not transferred with the patient. We are also going to get views of the right ankle and foot. We will discontinue her coumadin and put her on a Lovenox bridge. New posterior splint placed on the left ankle with better alignment. Old splint  had her too everted. Will order echo per recommendation of the cardiologist. Tentative schedule for ORIF left ankle surgery Monday by Dr. Toni Arthurs, MD. Dr. Darrelyn Hillock discussed the case with Dr. Victorino Dike.   Cynthia Frank 10/15/2011, 4:48 PM

## 2011-10-15 NOTE — Progress Notes (Signed)
Spoke with Echo lab at [facility]. Cynthia Frank will be here in am to perform Echocardiogram. Will be read Sunday or Monday in preparation for surgery Mon/Tues.

## 2011-10-15 NOTE — Progress Notes (Signed)
Subjective: She fell last night at [facility] and Sustained a Closed TriMaleolar Ankle Fracture. She was on Coumadin for Atrial Fib. She was given Vitamin K in [place] for preparation for Surgery,but she wanted transferred to [facility]. Dr. Carolanne Grumbling is her Cardiologist and Dr. Wyonia Hough is her Primary MD.I removed her Splint and examined her ankle and she has Pressure Blood Blisters Medially. She has excellent Capillary Refill and an excellent Dorsalis Pedal Pulse. She has a hematoma on her opposite right Foot. New Xrays will be ordered.I will stop her Coumadin and bridge her with Lovenox.Dr. Alfonzo Feller foot and ankle specialist will evaluate her.  Objective: Vital signs in last 24 hours: Temp:  [97.8 F (36.6 C)] 97.8 F (36.6 C) (06/29 1605) Pulse Rate:  [56] 56  (06/29 1605) Resp:  [16] 16  (06/29 1605) BP: (115)/(70) 115/70 mmHg (06/29 1605) SpO2:  [96 %] 96 % (06/29 1605) Weight:  [68.04 kg (150 lb)] 68.04 kg (150 lb) (06/29 1620)  Intake/Output from previous day:   Intake/Output this shift:    No results found for this basename: HGB:5 in the last 72 hours No results found for this basename: WBC:2,RBC:2,HCT:2,PLT:2 in the last 72 hours No results found for this basename: NA:2,K:2,CL:2,CO2:2,BUN:2,CREATININE:2,GLUCOSE:2,CALCIUM:2 in the last 72 hours No results found for this basename: LABPT:2,INR:2 in the last 72 hours  Neurologically intact Intact pulses distally  Assessment/Plan: Medical evaluation and surgery when cleared.Xrays as ordered.   Gaelen Brager A 10/15/2011, 5:06 PM

## 2011-10-15 NOTE — Plan of Care (Signed)
Problem: Diagnosis - Type of Surgery Goal: General Surgical Patient Education (See Patient Education module for education specifics) Outcome: Completed/Met Date Met:  10/15/11 Fractured left ankle

## 2011-10-15 NOTE — Progress Notes (Signed)
Dr Darrelyn Hillock at bedside attempting closed reduction of lt ankle. Cynthia Frank, [employer]

## 2011-10-16 ENCOUNTER — Inpatient Hospital Stay (HOSPITAL_COMMUNITY): Payer: Medicare Other

## 2011-10-16 ENCOUNTER — Encounter (HOSPITAL_COMMUNITY)
Admission: AD | Disposition: A | Discharge status: Skilled Nursing Facility | Payer: Self-pay | Source: Ambulatory Visit | Attending: Orthopedic Surgery

## 2011-10-16 ENCOUNTER — Inpatient Hospital Stay (HOSPITAL_COMMUNITY): Payer: Medicare Other | Admitting: Anesthesiology

## 2011-10-16 ENCOUNTER — Encounter (HOSPITAL_COMMUNITY): Payer: Self-pay | Admitting: Anesthesiology

## 2011-10-16 DIAGNOSIS — N390 Urinary tract infection, site not specified: Secondary | ICD-10-CM

## 2011-10-16 DIAGNOSIS — I4891 Unspecified atrial fibrillation: Secondary | ICD-10-CM

## 2011-10-16 HISTORY — PX: ANKLE CLOSED REDUCTION: SHX880

## 2011-10-16 LAB — PROTIME-INR
INR: 1.7 — ABNORMAL HIGH (ref 0.00–1.49)
Prothrombin Time: 20.3 seconds — ABNORMAL HIGH (ref 11.6–15.2)

## 2011-10-16 SURGERY — CLOSED REDUCTION, ANKLE
Anesthesia: Monitor Anesthesia Care | Site: Ankle | Laterality: Left | Wound class: Clean

## 2011-10-16 MED ORDER — LACTATED RINGERS IV SOLN
INTRAVENOUS | Status: DC
  Administered 2011-10-16 – 2011-10-17 (×3): via INTRAVENOUS

## 2011-10-16 MED ORDER — LACTATED RINGERS IV SOLN: INTRAVENOUS | Status: DC

## 2011-10-16 MED ORDER — ONDANSETRON HCL 4 MG/2ML IJ SOLN
INTRAMUSCULAR | Status: DC | PRN
  Administered 2011-10-16 (×2): 2 mg via INTRAVENOUS

## 2011-10-16 MED ORDER — ALUM & MAG HYDROXIDE-SIMETH 200-200-20 MG/5ML PO SUSP
30.0000 mL | ORAL | Status: DC | PRN
  Administered 2011-10-20: 30 mL via ORAL
  Filled 2011-10-16: qty 30

## 2011-10-16 MED ORDER — SUCCINYLCHOLINE CHLORIDE 20 MG/ML IJ SOLN
INTRAMUSCULAR | Status: DC | PRN
  Administered 2011-10-16: 60 mg via INTRAVENOUS

## 2011-10-16 MED ORDER — CIPROFLOXACIN HCL 250 MG PO TABS
250.0000 mg | ORAL_TABLET | Freq: Two times a day (BID) | ORAL | Status: DC
  Administered 2011-10-16 – 2011-10-19 (×6): 250 mg via ORAL
  Filled 2011-10-16 (×10): qty 1

## 2011-10-16 MED ORDER — HYDROMORPHONE HCL PF 1 MG/ML IJ SOLN
0.2500 mg | INTRAMUSCULAR | Status: DC | PRN
  Administered 2011-10-16 (×4): 0.5 mg via INTRAVENOUS

## 2011-10-16 MED ORDER — AMIODARONE HCL 200 MG PO TABS
200.0000 mg | ORAL_TABLET | Freq: Every day | ORAL | Status: DC
  Administered 2011-10-16 – 2011-10-20 (×5): 200 mg via ORAL
  Filled 2011-10-16 (×5): qty 1

## 2011-10-16 MED ORDER — PROMETHAZINE HCL 25 MG/ML IJ SOLN: 6.2500 mg | INTRAMUSCULAR | Status: DC | PRN

## 2011-10-16 MED ORDER — OMEGA-3-ACID ETHYL ESTERS 1 G PO CAPS
1.0000 g | ORAL_CAPSULE | ORAL | Status: DC
  Administered 2011-10-17 – 2011-10-21 (×5): 1 g via ORAL
  Filled 2011-10-16 (×11): qty 1

## 2011-10-16 MED ORDER — HYDROMORPHONE HCL PF 1 MG/ML IJ SOLN
INTRAMUSCULAR | Status: AC
  Filled 2011-10-16: qty 1

## 2011-10-16 MED ORDER — LIDOCAINE HCL (CARDIAC) 20 MG/ML IV SOLN
INTRAVENOUS | Status: DC | PRN
  Administered 2011-10-16: 30 mg via INTRAVENOUS

## 2011-10-16 MED ORDER — RAMIPRIL 5 MG PO CAPS
5.0000 mg | ORAL_CAPSULE | Freq: Every day | ORAL | Status: DC
  Administered 2011-10-17 – 2011-10-19 (×2): 5 mg via ORAL
  Filled 2011-10-16 (×5): qty 1

## 2011-10-16 MED ORDER — MEPERIDINE HCL 25 MG/ML IJ SOLN: 6.2500 mg | INTRAMUSCULAR | Status: DC | PRN

## 2011-10-16 MED ORDER — ENOXAPARIN SODIUM 40 MG/0.4ML ~~LOC~~ SOLN
40.0000 mg | SUBCUTANEOUS | Status: DC
  Administered 2011-10-16: 40 mg via SUBCUTANEOUS
  Filled 2011-10-16 (×2): qty 0.4

## 2011-10-16 MED ORDER — ACETAMINOPHEN 325 MG PO TABS: 650.0000 mg | ORAL_TABLET | Freq: Four times a day (QID) | ORAL | Status: DC | PRN

## 2011-10-16 MED ORDER — AMIODARONE HCL 200 MG PO TABS: 200.0000 mg | ORAL_TABLET | Freq: Every day | ORAL | Status: DC

## 2011-10-16 MED ORDER — METHOCARBAMOL 500 MG PO TABS
500.0000 mg | ORAL_TABLET | Freq: Four times a day (QID) | ORAL | Status: DC | PRN
  Administered 2011-10-19 – 2011-10-20 (×4): 500 mg via ORAL
  Filled 2011-10-16 (×4): qty 1

## 2011-10-16 MED ORDER — FENTANYL CITRATE 0.05 MG/ML IJ SOLN
INTRAMUSCULAR | Status: DC | PRN
  Administered 2011-10-16 (×2): 50 ug via INTRAVENOUS

## 2011-10-16 MED ORDER — ACETAMINOPHEN 650 MG RE SUPP: 650.0000 mg | Freq: Four times a day (QID) | RECTAL | Status: DC | PRN

## 2011-10-16 MED ORDER — ONDANSETRON HCL 4 MG/2ML IJ SOLN: 4.0000 mg | Freq: Four times a day (QID) | INTRAMUSCULAR | Status: DC | PRN

## 2011-10-16 MED ORDER — LACTATED RINGERS IV SOLN
INTRAVENOUS | Status: DC | PRN
  Administered 2011-10-16: 07:00:00 via INTRAVENOUS

## 2011-10-16 MED ORDER — PHENOL 1.4 % MT LIQD: 1.0000 | OROMUCOSAL | Status: DC | PRN

## 2011-10-16 MED ORDER — DEXTROSE 5 % IV SOLN
500.0000 mg | Freq: Four times a day (QID) | INTRAVENOUS | Status: DC | PRN
  Administered 2011-10-18: 500 mg via INTRAVENOUS
  Filled 2011-10-16 (×2): qty 5

## 2011-10-16 MED ORDER — EPHEDRINE SULFATE 50 MG/ML IJ SOLN
INTRAMUSCULAR | Status: DC | PRN
  Administered 2011-10-16: 5 mg via INTRAVENOUS

## 2011-10-16 MED ORDER — RAMIPRIL 5 MG PO CAPS: 5.0000 mg | ORAL_CAPSULE | Freq: Every day | ORAL | Status: DC

## 2011-10-16 MED ORDER — HYDROMORPHONE HCL PF 1 MG/ML IJ SOLN: 1.0000 mg | INTRAMUSCULAR | Status: DC | PRN

## 2011-10-16 MED ORDER — ONDANSETRON HCL 4 MG PO TABS: 4.0000 mg | ORAL_TABLET | Freq: Four times a day (QID) | ORAL | Status: DC | PRN

## 2011-10-16 MED ORDER — HYDROCODONE-ACETAMINOPHEN 10-325 MG PO TABS
1.0000 | ORAL_TABLET | ORAL | Status: DC | PRN
  Administered 2011-10-16 – 2011-10-20 (×8): 1 via ORAL
  Filled 2011-10-16 (×8): qty 1

## 2011-10-16 MED ORDER — MENTHOL 3 MG MT LOZG: 1.0000 | LOZENGE | OROMUCOSAL | Status: DC | PRN

## 2011-10-16 MED ORDER — PROPOFOL 10 MG/ML IV EMUL
INTRAVENOUS | Status: DC | PRN
  Administered 2011-10-16: 200 mg via INTRAVENOUS

## 2011-10-16 SURGICAL SUPPLY — 1 items: SPLINT FIBERGLASS 5X30 (CAST SUPPLIES) ×2 IMPLANT

## 2011-10-16 NOTE — Interval H&P Note (Signed)
History and Physical Interval Note:  10/16/2011 7:07 AM  Cynthia Frank  has presented today for surgery, with the diagnosis of left ankle fracture  The various methods of treatment have been discussed with the patient and family. After consideration of risks, benefits and other options for treatment, the patient has consented to  Procedure(s) (LRB): CLOSED REDUCTION ANKLE (Left) as a surgical intervention .  The patient's history has been reviewed, patient examined, no change in status, stable for surgery.  I have reviewed the patients' chart and labs.  Questions were answered to the patient's satisfaction.     [name] A

## 2011-10-16 NOTE — Transfer of Care (Signed)
Immediate Anesthesia Transfer of Care Note  Patient: Cynthia Frank  Procedure(s) Performed: Procedure(s) (LRB): CLOSED REDUCTION ANKLE (Left)  Patient Location: PACU  Anesthesia Type: General  Level of Consciousness: awake and alert   Airway & Oxygen Therapy: Patient Spontanous Breathing and Patient connected to face mask oxygen  Post-op Assessment: Report given to PACU RN and Post -op Vital signs reviewed and stable  Post vital signs: Reviewed and stable  Complications: No apparent anesthesia complications

## 2011-10-16 NOTE — Consult Note (Signed)
Reason for Consult:Medical management a. Fib, UTI Referring Physician: Dr. Jennette Kettle Cynthia Frank is an 76 y.o. female.  HPI: [place] displaced trimalar fracture of the ankle. She was brought back to [facility] for treatment. He admission lab did reveal mild left shift, mildly positive U/A. She does have a h/o a. Fib and is on full anticoagulation. Medicine is asked to consult for management of medical problems including a. Fib and UTI. She is feeling ok.  Past Medical History  Diagnosis Date  . Hx: UTI (urinary tract infection)   . Nonischemic cardiomyopathy     chemo related  . History of ovarian cancer 1985  . Hypertension   . Partial bowel obstruction   . Internal hemorrhoid   . Colon cancer 03/2006    T3, N0  . Atrial fibrillation     Past Surgical History  Procedure Date  . Abdominal hysterectomy   . Oophorectomy     '61  . Tonsillectomy     '44  . Resection of right ovarian cancer '84, repeat '86   . Laparotomy     '80 adhesions  . Resection due to partial bowel obstruction     1985  . Colon surgery     for colon cancer in 2007  . Laparotomy for takedown of intestinal obstruction secondary to adhesions     1989    Family History  Problem Relation Age of Onset  . Uterine cancer Mother   . Prostate cancer Father   . Heart disease Father   . Colon cancer Neg Hx     Social History:  reports that she quit smoking about 33 years ago. Her smoking use included Cigarettes. She has a 10 pack-year smoking history. She does not have any smokeless tobacco history on file. She reports that she drinks about 7 ounces of alcohol per week. She reports that she does not use illicit drugs.  Allergies:  Allergies  Allergen Reactions  . Clindamycin/Lincomycin     itching  . Sulfonamide Derivatives     REACTION: Itching/Swelling   No current facility-administered medications on file prior to encounter.   Current  Outpatient Prescriptions on File Prior to Encounter  Medication Sig Dispense Refill  . amiodarone (PACERONE) 200 MG tablet Take 200 mg by mouth daily.       Marland Kitchen aspirin 81 MG tablet Take 81 mg by mouth daily.       Marland Kitchen atorvastatin (LIPITOR) 10 MG tablet Take 10 mg by mouth daily.      . Coenzyme Q10 (COQ10) 100 MG CAPS Take 1 capsule by mouth daily.       . furosemide (LASIX) 20 MG tablet Take 20 mg by mouth daily.       . MULTIPLE VITAMINS PO Take by mouth daily.        . Omega-3 Fatty Acids (FISH OIL) 1000 MG CAPS Take 2,000 capsules by mouth daily.       . potassium chloride SA (K-DUR,KLOR-CON) 20 MEQ tablet Take 40 mEq by mouth daily.       . ramipril (ALTACE) 10 MG capsule Take 5 mg by mouth daily.       Marland Kitchen tretinoin (RETIN-A) 0.1 % cream Apply topically at bedtime.        Marland Kitchen zolpidem (AMBIEN) 10 MG tablet Take 0.5 tablets (5 mg total) by mouth at bedtime as needed.  30 tablet  0      Results for  orders placed during the hospital encounter of 10/15/11 (from the past 48 hour(s))  SURGICAL PCR SCREEN     Status: Abnormal   Collection Time   10/15/11  4:50 PM      Component Value Range Comment   MRSA, PCR POSITIVE (*) NEGATIVE    Staphylococcus aureus POSITIVE (*) NEGATIVE   CBC     Status: Abnormal   Collection Time   10/15/11  7:00 PM      Component Value Range Comment   WBC 10.8 (*) 4.0 - 10.5 K/uL    RBC 4.13  3.87 - 5.11 MIL/uL    Hemoglobin 12.7  12.0 - 15.0 g/dL    HCT 16.1  09.6 - 04.5 %    MCV 90.1  78.0 - 100.0 fL    MCH 30.8  26.0 - 34.0 pg    MCHC 34.1  30.0 - 36.0 g/dL    RDW 40.9  81.1 - 91.4 %    Platelets 137 (*) 150 - 400 K/uL   COMPREHENSIVE METABOLIC PANEL     Status: Abnormal   Collection Time   10/15/11  7:00 PM      Component Value Range Comment   Sodium 131 (*) 135 - 145 mEq/L    Potassium 3.6  3.5 - 5.1 mEq/L    Chloride 94 (*) 96 - 112 mEq/L    CO2 24  19 - 32 mEq/L    Glucose, Bld 126 (*) 70 - 99 mg/dL    BUN 11  6 - 23 mg/dL    Creatinine, Ser 7.82   0.50 - 1.10 mg/dL    Calcium 8.3 (*) 8.4 - 10.5 mg/dL    Total Protein 6.9  6.0 - 8.3 g/dL    Albumin 3.6  3.5 - 5.2 g/dL    AST 42 (*) 0 - 37 U/L    ALT 41 (*) 0 - 35 U/L    Alkaline Phosphatase 71  39 - 117 U/L    Total Bilirubin 1.1  0.3 - 1.2 mg/dL    GFR calc non Af Amer 87 (*) >90 mL/min    GFR calc Af Amer >90  >90 mL/min   DIFFERENTIAL     Status: Abnormal   Collection Time   10/15/11  7:00 PM      Component Value Range Comment   Neutrophils Relative 80 (*) 43 - 77 %    Neutro Abs 8.6 (*) 1.7 - 7.7 K/uL    Lymphocytes Relative 10 (*) 12 - 46 %    Lymphs Abs 1.1  0.7 - 4.0 K/uL    Monocytes Relative 10  3 - 12 %    Monocytes Absolute 1.1 (*) 0.1 - 1.0 K/uL    Eosinophils Relative 0  0 - 5 %    Eosinophils Absolute 0.0  0.0 - 0.7 K/uL    Basophils Relative 0  0 - 1 %    Basophils Absolute 0.0  0.0 - 0.1 K/uL   APTT     Status: Abnormal   Collection Time   10/15/11  7:00 PM      Component Value Range Comment   aPTT 55 (*) 24 - 37 seconds   PROTIME-INR     Status: Abnormal   Collection Time   10/15/11  7:00 PM      Component Value Range Comment   Prothrombin Time 26.9 (*) 11.6 - 15.2 seconds    INR 2.44 (*) 0.00 - 1.49   URINALYSIS, ROUTINE W REFLEX MICROSCOPIC  Status: Abnormal   Collection Time   10/15/11  9:18 PM      Component Value Range Comment   Color, Urine YELLOW  YELLOW    APPearance CLOUDY (*) CLEAR    Specific Gravity, Urine 1.021  1.005 - 1.030    pH 6.5  5.0 - 8.0    Glucose, UA NEGATIVE  NEGATIVE mg/dL    Hgb urine dipstick NEGATIVE  NEGATIVE    Bilirubin Urine NEGATIVE  NEGATIVE    Ketones, ur NEGATIVE  NEGATIVE mg/dL    Protein, ur NEGATIVE  NEGATIVE mg/dL    Urobilinogen, UA 0.2  0.0 - 1.0 mg/dL    Nitrite NEGATIVE  NEGATIVE    Leukocytes, UA SMALL (*) NEGATIVE   URINE MICROSCOPIC-ADD ON     Status: Abnormal   Collection Time   10/15/11  9:18 PM      Component Value Range Comment   Squamous Epithelial / LPF MANY (*) RARE    WBC, UA 3-6  <3  WBC/hpf    RBC / HPF 0-2  <3 RBC/hpf    Bacteria, UA MANY (*) RARE    Urine-Other MUCOUS PRESENT     PROTIME-INR     Status: Abnormal   Collection Time   10/16/11  4:24 AM      Component Value Range Comment   Prothrombin Time 20.3 (*) 11.6 - 15.2 seconds    INR 1.70 (*) 0.00 - 1.49     Dg Ankle 2 Views Left  10/16/2011  *RADIOLOGY REPORT*  Clinical Data: Left ankle fracture status post closed reduction.  LEFT ANKLE - 2 VIEW  Comparison: 10/15/2011.  Findings: Again noted is a comminuted trimalleolar fracture of the ankle.  Compared to the prior examinations, there is improved alignment, with reduction of the previously noted tibiotalar dislocation, and significantly improved alignment at the site of distal fibular fracture.  Overlying splint material is noted.  IMPRESSION: 1.  Status post closed reduction for severely comminuted trimalleolar fracture dislocation of the left ankle with significantly improved alignment, as above.  Original Report Authenticated By: Florencia Reasons, M.D.   Dg Ankle Complete Left  10/15/2011  *RADIOLOGY REPORT*  Clinical Data: Left ankle pain  LEFT ANKLE COMPLETE - 3+ VIEW  Comparison: None  Findings: Comminuted trimalleolar fracture dislocation of the left ankle is identified.  There is posterior dislocation of the tibiotalar joint.  Posterior and medial angulation of the distal fracture fragments noted.  IMPRESSION:  1.  Comminuted trimalleolar fracture dislocation of the left ankle. There is continued dislocation of the tibiotalar joint with posterior angulation of the distal fracture fragments.  Original Report Authenticated By: Rosealee Albee, M.D.   Dg Ankle Complete Right  10/15/2011  *RADIOLOGY REPORT*  Clinical Data: Pain, swelling, and bruising post fall  RIGHT ANKLE - COMPLETE 3+ VIEW  Comparison: None  Findings: Osseous mineralization normal. Ankle mortise intact. Lateral soft tissue swelling. Tiny plantar calcaneal spur. No acute fracture, dislocation  or bone destruction.  IMPRESSION: No acute osseous abnormalities.  Original Report Authenticated By: Lollie Marrow, M.D.   Dg Ankle Left Port  10/15/2011  *RADIOLOGY REPORT*  Clinical Data: Closed reduction of left ankle trimalleolar fracture dislocation  PORTABLE LEFT ANKLE - 2 VIEW  Comparison: Portable exam 1948 hours compared earlier study of 10/15/2011  Findings: Fiberglass splint material present. Persistent posterior dislocation of the talus. Displaced fragments from the distal left tibial intra-articular fracture again identified. Lateral and posterior displacement of the distal fibular fragment is seen  with apex anterior angulation. Probable plantar calcaneal spur.  IMPRESSION: Trimalleolar fracture dislocation left ankle post reduction with persistent posterior talar dislocation.  Original Report Authenticated By: Lollie Marrow, M.D.   Dg Foot 2 Views Right  10/15/2011  *RADIOLOGY REPORT*  Clinical Data: Pain, swelling and bruising post fall  RIGHT FOOT - 2 VIEW  Comparison: None  Findings: Mild hallux valgus with bony overgrowth at medial margin of first metatarsal head and overlying skin thickening. Osseous mineralization normal. Joint spaces preserved. No oblique view obtained. No acute fracture, dislocation, or bone destruction. Plantar calcaneal spur present.  IMPRESSION: Mild hallux valgus and bunion deformity. Small plantar calcaneal spur. No definite acute osseous findings.  Original Report Authenticated By: Lollie Marrow, M.D.    Review of Systems  Constitutional: Negative.   HENT: Negative.   Eyes: Negative.   Respiratory: Negative.   Cardiovascular: Negative for chest pain, leg swelling and PND.  Gastrointestinal: Negative.   Genitourinary: Negative.   Musculoskeletal: Negative.   Skin: Negative.   Neurological: Negative.   Endo/Heme/Allergies: Negative.    Blood pressure 115/70, pulse 60, temperature 97.9 F (36.6 C), temperature source Oral, resp. rate 16, height 5\' 5"   (1.651 m), weight 150 lb (68.04 kg), SpO2 95.00%. Physical Exam Very pleasant white woman in no distress, fully awake of light anesthesia this AM for closed reduction of ankle HEENT- no sign of trauma, C&S clear Cor- RRR Pulm - normal respirations Abd- soft, BS +, no guarding or rebound Neuro - non focal  Assessment/Plan: 1. Ortho - s/p closed reduction earlier today. For ORIF Tuesday, July 2nd 2. A. Fib - in sinus rhythm on amiodarone. Coumadin stopped - NR 2.44 Plan - will follow INR and start lovenox when less than 2.0 3. UTI - mildly positive U/a Plan septra DS bid  Thanks for calling me. Will follow along.   Casimiro Needle Wilena Tyndall 10/16/2011, 1:15 PM

## 2011-10-16 NOTE — Op Note (Signed)
Cynthia Frank, Cynthia Frank            ACCOUNT NO.:  [id number]  MEDICAL RECORD NO.:  [id number]  LOCATION:  1604                         FACILITY:  [facility]  PHYSICIAN:  Georges Lynch. Sianni Cloninger, M.D.DATE OF BIRTH:  1936/02/03  DATE OF PROCEDURE:  10/16/2011 DATE OF DISCHARGE:                              OPERATIVE REPORT   PREOPERATIVE DIAGNOSIS:  Trimalleolar fracture dislocation of the left ankle.  POSTOPERATIVE DIAGNOSIS:  Trimalleolar fracture dislocation of the left ankle.  OPERATION: 1. Closed reduction of a trimalleolar fracture dislocation, left     ankle. 2. Skin inspection, left ankle. 3. Application of a short-leg cast, well padded, which will be split     in the recovery.  SURGEON:  Georges Lynch. Omar Orrego, MD  ASSISTANT:  OR staff.  LITTLE BACKGROUND:  This lady was seen in [facility].  She had a fracture dislocation of her ankle.  She was splinted.  She said she came in to the emergency room there Friday night at 8 p.m.  The way she informed me, she was seen by the orthopedic surgeon the following day. While they were going to do surgery there, she decided to come back home here to [place].  When she came back to [place], I saw her late in the afternoon about 20 hours post injury.  I inspected her skin, she had blood blisters over the medial aspect of her medial malleolus.  She had a good dorsalis pedis pulse.  I reapplied her splint.  Note, there was no movement at all noted from change of one splint to the other.  When I x-ray'd her, noted that she had a complete anterior dislocation of her tibia along her talus, which basically was not reduced prior to coming here.  The only x-ray that was given to me were 2 x-rays of AP and oblique, there were no laterals, and these were on a printed material. Therefore, I tried a closed reduction and I dictated that last night up on the floor under local ankle block and IV Dilaudid and it was too difficult.  I did not want to  force the issue, so I elected to bring her to surgery today, this morning at 7 o'clock on Sunday morning.  The procedure was a closed reduction of a dislocated trimalleolar ankle fracture dislocation and application of a well-padded short leg cast.  PROCEDURE IN DETAIL:  Under general anesthesia, we took all MRSA precautions since she has had MRSA noted in her nasal cavity.  I did remove the splint and examined her wound.  The medial malleolar area looked fine.  It was dry now with no blistering.  I did a closed manipulation under general anesthesia, and kept her foot in dorsiflexion and put her in a well-padded short-leg cast and was put that in the recovery room.  We have x-ray'd to verify the reduction, they were taken under C-arm.  The appropriate time-out __________ was carried out first. I did mark the appropriate left leg as well.  I marked that in the holding area.  I thoroughly discussed the case with her husband and she, and told her she may have a piece of bone still trapped in that posterior compartment, but Dr.  __________ will take care of her surgery since the skin is cleared.  She will be maintained on Lovenox protocol.  This is the second attempt at reduction.  One was on the floor last night on October 15, 2011, and one today October 16, 2011.          ______________________________ Georges Lynch. Darrelyn Hillock, M.D.     RAG/MEDQ  D:  10/16/2011  T:  10/16/2011  Job:  [id number]

## 2011-10-16 NOTE — Anesthesia Postprocedure Evaluation (Signed)
  Anesthesia Post-op Note  Patient: Cynthia Frank  Procedure(s) Performed: Procedure(s) (LRB): CLOSED REDUCTION ANKLE (Left)  Patient Location: PACU  Anesthesia Type: General  Level of Consciousness: awake and alert   Airway and Oxygen Therapy: Patient Spontanous Breathing and Patient connected to face mask oxygen  Post-op Pain: none  Post-op Assessment: Post-op Vital signs reviewed, Patient's Cardiovascular Status Stable, Respiratory Function Stable, Patent Airway, No signs of Nausea or vomiting, Pain level controlled and No headache  Post-op Vital Signs: Reviewed and stable  Complications: No apparent anesthesia complications

## 2011-10-16 NOTE — Brief Op Note (Signed)
10/15/2011 - 10/16/2011  7:48 AM  PATIENT:  Donnal Moat  76 y.o. female  PRE-OPERATIVE DIAGNOSIS:  left ankle fracture dislocation,Trimalelolar Fx,Closed.  POST-OPERATIVE DIAGNOSIS:  left ankle fracture.Trimaleolar Fx Dislocation,Closed.  PROCEDURE:  Procedure(s) (LRB): CLOSED REDUCTION ANKLE (Left)  SURGEON:  Surgeon(s) and Role:    * Jacki Cones, MD - Primary    ASSISTANTS: OR Staff  ANESTHESIA:   general  EBL:     BLOOD ADMINISTERED:none  DRAINS: none   LOCAL MEDICATIONS USED:  NONE  SPECIMEN:  No Specimen  DISPOSITION OF SPECIMEN:  N/A  COUNTS:  YES  TOURNIQUET:  * No tourniquets in log *  DICTATION: .Other Dictation: Dictation Number [id number]  PLAN OF CARE: Admit to inpatient   PATIENT DISPOSITION:  PACU - hemodynamically stable.   Delay start of Pharmacological VTE agent (>24hrs) due to surgical blood loss or risk of bleeding: yes

## 2011-10-16 NOTE — Op Note (Signed)
Cynthia Frank, Cynthia Frank            ACCOUNT NO.:  [id number]  MEDICAL RECORD NO.:  [id number]  LOCATION:  1604                         FACILITY:  [facility]  PHYSICIAN:  Georges Lynch. Kainoah Bartosiewicz, M.D.DATE OF BIRTH:  [birth year]  DATE OF PROCEDURE:  10/15/2011 DATE OF DISCHARGE:                              OPERATIVE REPORT   PREOPERATIVE DIAGNOSIS:  Closed fracture dislocation, left ankle.  POSTOPERATIVE DIAGNOSIS:  Closed fracture dislocation, left ankle.  The operation on the floor because the patient has eaten,  because of her INR was 2.4, she had been on Coumadin.  She was transferred from [facility] without lateral x-ray and without a closed reduction.  She had a severe tibiotalar dislocation with the tibia dislocated anteriorly.  I did speak to my associate Dr. Artist Pais, and we both agreed that I would give her an ankle block plus some IV Dilaudid.  I did have her relaxed.  I did first marked the left leg, and then the leg and ankle were cleaned with alcohol and Betadine, and I did a time-out first, appropriate time-out.  I also had her sign a consent and I did an attempt to close reduction under ankle block.  I injected a total of 20 mL of 1% Xylocaine at the ankle.  We had a good relaxation, but the talus was obviously trapped.  I could not reduce it on the floor.  I did not feel that it was safe to do any further, so I am going to keep her n.p.o. tonight.  I did resplint her, took new x-rays that showed that the tibiotalar joint still dislocated.  So I am going to go ahead and get her set up for a closed reduction in the morning under general anesthesia.  I am going to give her vitamin K to see if we can bring her INR down.  And I will have her sign a new consent.  She is eventually going to need to have an open reduction, but her skin is in no condition to do that right now, but I still certainly cannot leave at dislocation the way it is, and as I said, she has eaten, and it is just not  safe to do that tonight.          ______________________________ Georges Lynch. Darrelyn Hillock, M.D.     RAG/MEDQ  D:  10/15/2011  T:  10/16/2011  Job:  [id number]

## 2011-10-16 NOTE — Anesthesia Preprocedure Evaluation (Addendum)
Anesthesia Evaluation  Patient identified by MRN, date of birth, ID band Patient awake    Reviewed: Allergy & Precautions, H&P , NPO status , Patient's Chart, lab work & pertinent test results  Airway Mallampati: II TM Distance: >3 FB Neck ROM: Full    Dental No notable dental hx.    Pulmonary neg pulmonary ROS,  breath sounds clear to auscultation  Pulmonary exam normal       Cardiovascular hypertension, Pt. on medications negative cardio ROS  + dysrhythmias Atrial Fibrillation Rhythm:Regular Rate:Normal  Chemo induced nonischemic cardiomyopathy   Neuro/Psych negative neurological ROS  negative psych ROS   GI/Hepatic negative GI ROS, Neg liver ROS,   Endo/Other  negative endocrine ROS  Renal/GU negative Renal ROS  negative genitourinary   Musculoskeletal negative musculoskeletal ROS (+)   Abdominal   Peds negative pediatric ROS (+)  Hematology negative hematology ROS (+)   Anesthesia Other Findings   Reproductive/Obstetrics negative OB ROS                          Anesthesia Physical Anesthesia Plan  ASA: III  Anesthesia Plan: MAC   Post-op Pain Management:    Induction: Intravenous  Airway Management Planned: Mask  Additional Equipment:   Intra-op Plan:   Post-operative Plan:   Informed Consent: I have reviewed the patients History and Physical, chart, labs and discussed the procedure including the risks, benefits and alternatives for the proposed anesthesia with the patient or authorized representative who has indicated his/her understanding and acceptance.   Dental advisory given  Plan Discussed with:   Anesthesia Plan Comments:        Anesthesia Quick Evaluation

## 2011-10-16 NOTE — H&P (View-Only) (Signed)
Dr Gioffre at bedside attempting closed reduction of lt ankle. Cynthia Frank  

## 2011-10-17 ENCOUNTER — Inpatient Hospital Stay (HOSPITAL_COMMUNITY): Payer: Medicare Other

## 2011-10-17 ENCOUNTER — Encounter (HOSPITAL_COMMUNITY): Payer: Self-pay | Admitting: Orthopedic Surgery

## 2011-10-17 MED ORDER — CEFAZOLIN SODIUM-DEXTROSE 2-3 GM-% IV SOLR
2.0000 g | INTRAVENOUS | Status: DC
  Filled 2011-10-17: qty 50

## 2011-10-17 MED ORDER — SODIUM CHLORIDE 0.9 % IV SOLN
INTRAVENOUS | Status: DC
  Administered 2011-10-17 – 2011-10-18 (×2): via INTRAVENOUS

## 2011-10-17 MED ORDER — METOPROLOL TARTRATE 50 MG PO TABS
50.0000 mg | ORAL_TABLET | Freq: Two times a day (BID) | ORAL | Status: DC
  Administered 2011-10-19 – 2011-10-20 (×3): 50 mg via ORAL
  Filled 2011-10-17 (×8): qty 1

## 2011-10-17 MED ORDER — VITAMIN K1 10 MG/ML IJ SOLN
5.0000 mg | Freq: Once | INTRAVENOUS | Status: AC
  Administered 2011-10-17: 5 mg via INTRAVENOUS
  Filled 2011-10-17: qty 0.5

## 2011-10-17 MED ORDER — ENOXAPARIN SODIUM 80 MG/0.8ML ~~LOC~~ SOLN
1.0000 mg/kg | Freq: Once | SUBCUTANEOUS | Status: AC
  Administered 2011-10-17: 70 mg via SUBCUTANEOUS
  Filled 2011-10-17: qty 0.8

## 2011-10-17 NOTE — Progress Notes (Signed)
PT Cancellation Note  Evaluation cancelled today due to medical issues with patient which prohibited therapy.  Pt to possibly have surgery today or tomorrow.  Please reorder once pt is ready to mobilize.  Cynthia Frank,[name] 10/17/2011, 8:13 AM Pager: [phone]

## 2011-10-17 NOTE — Progress Notes (Signed)
ANTICOAGULATION CONSULT NOTE - Initial Consult  Pharmacy Consult for Lovenox Bridge Indication: Afib with Subtherapeutic INR  Allergies  Allergen Reactions  . Clindamycin/Lincomycin     itching  . Sulfonamide Derivatives     REACTION: Itching/Swelling    Patient Measurements: Height: 5\' 5"  (165.1 cm) Weight: 150 lb (68.04 kg) IBW/kg (Calculated) : 57   Vital Signs: Temp: 98.5 F (36.9 C) (07/01 1435) Temp src: Oral (07/01 1435) BP: 104/61 mmHg (07/01 1435) Pulse Rate: 63  (07/01 1435)  Labs:  Basename 10/16/11 0424 10/15/11 1900  HGB -- 12.7  HCT -- 37.2  PLT -- 137*  APTT -- 55*  LABPROT 20.3* 26.9*  INR 1.70* 2.44*  HEPARINUNFRC -- --  CREATININE -- 0.62  CKTOTAL -- --  CKMB -- --  TROPONINI -- --    Estimated Creatinine Clearance: 56.4 ml/min (by C-G formula based on Cr of 0.62).   Medical History: Past Medical History  Diagnosis Date  . Hx: UTI (urinary tract infection)   . Nonischemic cardiomyopathy     chemo related  . History of ovarian cancer 1985  . Hypertension   . Partial bowel obstruction   . Internal hemorrhoid   . Colon cancer 03/2006    T3, N0  . Atrial fibrillation     Medications:  Scheduled:    . amiodarone  200 mg Oral QPC supper  . atorvastatin  10 mg Oral q1800  .  ceFAZolin (ANCEF) IV  2 g Intravenous 60 min Pre-Op  . ciprofloxacin  250 mg Oral BID  . furosemide  20 mg Oral Daily  . HYDROmorphone      . metoprolol succinate  100 mg Oral BID  . mupirocin ointment  1 application Nasal BID  . omega-3 acid ethyl esters  1 g Oral Custom  . phytonadione (VITAMIN K) IV  5 mg Intravenous Once  . potassium chloride SA  40 mEq Oral Daily  . ramipril  5 mg Oral QPC supper  . tretinoin   Topical QHS  . DISCONTD: enoxaparin  40 mg Subcutaneous Q24H   Infusions:    . sodium chloride 75 mL/hr at 10/17/11 1432  . DISCONTD: lactated ringers 100 mL/hr at 10/17/11 0455    Assessment: 76 yo F with Afib, on chronic warfarin, which  is being held for ortho surgery. Has received multiple doses of Vit K (10mg  IV on 6/29 and 5mg  IV on 10/17/11), INR now subtherapeutic. Order to start a Lovenox bridge tonight, with plans for OR tomorrow afternoon/evening. For now, will order a one-time Lovenox 1mg /kg dose and follow up in am to see if surgery plans have changed or if a definitive OR time has been scheduled.  Goal of Therapy:  Anti-Xa level 0.6-1.2 units/ml 4hrs after LMWH dose given Monitor platelets by anticoagulation protocol: Yes   Plan:  1) Lovenox 1mg /kg SQ x1 tonight 2) Pharmacist to follow up in am to see when surgery time is and if another Lovenox dose will be needed tomorrow am. (next dose would be due 12 hours after the first dose)  Darrol Angel, PharmD Pager: [phone] 10/17/2011,6:39 PM

## 2011-10-17 NOTE — Progress Notes (Signed)
Clinical Social Work Department BRIEF PSYCHOSOCIAL ASSESSMENT 10/17/2011  Patient:  Cynthia Frank, Cynthia Frank     Account Number:  [id number]     Admit date:  10/15/2011  Clinical Social Worker:  Candie Chroman  Date/Time:  10/17/2011 02:28 PM  Referred by:  Physician  Date Referred:  10/17/2011 Referred for  SNF Placement   Other Referral:   Interview type:  Patient Other interview type:    PSYCHOSOCIAL DATA Living Status:  HUSBAND Admitted from facility:   Level of care:   Primary support name:  Cynthia Frank Primary support relationship to patient:  SPOUSE Degree of support available:   supportive    CURRENT CONCERNS Current Concerns  Post-Acute Placement   Other Concerns:    SOCIAL WORK ASSESSMENT / PLAN Pt is a 76 yr old female living at home with spouse prior to hospitalization. CSW met with pt/spouse today to assist with d/c planning. Pt is planning to have ORIf Tuesday and expects to need ST SNF placement following hospitalization. CSW will initiate SNF search following surgery and provide bed offers as received.   Assessment/plan status:  Psychosocial Support/Ongoing Assessment of Needs Other assessment/ plan:   Information/referral to community resources:   SNF list, explained medicare coverage in SNF    PATIENT'S/FAMILY'S RESPONSE TO PLAN OF CARE: Pt feels ST SNF may be needed upon d/c.    Cynthia Razor LCSW [phone]

## 2011-10-17 NOTE — Progress Notes (Signed)
Subjective: 76 y/o female with PMH of afib on chronic coumadin fell while out of town Friday.  She returned to [place] and was found to have a dislocated ankle.  She was taken to the OR Sunday morning for closed reduction and splinting.  She c/o aching pain in the ankle that is mild.  Denies numbness, tingling or weakness in right foot.  No h/o previous ankle injury or surgery.  Dr. Debby Bud is consulting.  She has gotten several doses of vitamin K in an effort to normalize her INR in preparation for surgical treatment of her ankle injury.  Her pain is improved with elevation.  She is not a smoker.  Objective: Vital signs in last 24 hours: Temp:  [97.3 F (36.3 C)-98.5 F (36.9 C)] 98.3 F (36.8 C) (07/01 0458) Pulse Rate:  [53-67] 53  (07/01 0458) Resp:  [16] 16  (07/01 0458) BP: (102-115)/(60-70) 110/60 mmHg (07/01 0458) SpO2:  [93 %-95 %] 94 % (07/01 0458)  Intake/Output from previous day: 06/30 0701 - 07/01 0700 In: 3161.7 [P.O.:600; I.V.:2561.7] Out: 3500 [Urine:3500] Intake/Output this shift: Total I/O In: 170 [P.O.:120; IV Piggyback:50] Out: -    Basename 10/15/11 1900  HGB 12.7    Basename 10/15/11 1900  WBC 10.8*  RBC 4.13  HCT 37.2  PLT 137*    Basename 10/15/11 1900  NA 131*  K 3.6  CL 94*  CO2 24  BUN 11  CREATININE 0.62  GLUCOSE 126*  CALCIUM 8.3*    Basename 10/16/11 0424 10/15/11 1900  LABPT -- --  INR 1.70* 2.44*    wn wd female in nad.  a and o x 4.  mood and affect normal.  EOMI.  respirations unlabored.  R ankle splinted.  Brisk cap refill at toes.  Feels LT at toes.  PF and DF5/5 at toes.  Skin proximal to splint is healthy and intact.  Per Dr. Darrelyn Hillock skin at ankle is healthy without blisters or excessive swelling.  Assessment/Plan: R ankle dislocation with tibial pilon and fibula fractures s/p closed reduction under general anesthesia - based on the plain xrays I suspect this is more than a tri-mal fracture.  i've ordered a CT scan of the  ankle to eval the fx pattern.  I'll post her for OR tomorrow given that her INR will likely be below 1.5.  In the meantime she knows to keep it elevated.  D/c lovenox.  Continue SCDs.    HEWITTJonny Ruiz 10/17/2011, 10:02 AM

## 2011-10-17 NOTE — Progress Notes (Signed)
  Echocardiogram 2D Echocardiogram has been performed.  Estha Few 10/17/2011, 9:15 AM

## 2011-10-17 NOTE — Progress Notes (Signed)
Subjective: INR is 1.7,will give 5mg m of Vitamin K IV Now. Circulation looks fine. Dr. Victorino Dike will see her today.Repeat INR in A.M.   Objective: Vital signs in last 24 hours: Temp:  [97.2 F (36.2 C)-98.5 F (36.9 C)] 98.3 F (36.8 C) (07/01 0458) Pulse Rate:  [53-67] 53  (07/01 0458) Resp:  [15-16] 16  (07/01 0458) BP: (102-115)/(56-70) 110/60 mmHg (07/01 0458) SpO2:  [93 %-95 %] 94 % (07/01 0458)  Intake/Output from previous day: 06/30 0701 - 07/01 0700 In: 3161.7 [P.O.:600; I.V.:2561.7] Out: 3500 [Urine:3500] Intake/Output this shift:     Basename 10/15/11 1900  HGB 12.7    Basename 10/15/11 1900  WBC 10.8*  RBC 4.13  HCT 37.2  PLT 137*    Basename 10/15/11 1900  NA 131*  K 3.6  CL 94*  CO2 24  BUN 11  CREATININE 0.62  GLUCOSE 126*  CALCIUM 8.3*    Basename 10/16/11 0424 10/15/11 1900  LABPT -- --  INR 1.70* 2.44*    Neurologically intact Neurovascular intact  Assessment/Plan: Dr. Victorino Dike to evaluate for surgery. Will give Vit. K now.   Mahlik Lenn A 10/17/2011, 7:18 AM

## 2011-10-17 NOTE — Progress Notes (Signed)
Subjective: Cynthia Frank has no c/o: not much pain in the left ankle. She did have CT this PM and 2 D echo this AM  Objective: Lab: BMET    Component Value Date/Time   NA 131* 10/15/2011 1900   NA 141 08/17/2011 0807   K 3.6 10/15/2011 1900   K 4.2 08/17/2011 0807   CL 94* 10/15/2011 1900   CL 94* 08/17/2011 0807   CO2 24 10/15/2011 1900   CO2 28 08/17/2011 0807   GLUCOSE 126* 10/15/2011 1900   GLUCOSE 110 08/17/2011 0807   BUN 11 10/15/2011 1900   BUN 14 08/17/2011 0807   CREATININE 0.62 10/15/2011 1900   CREATININE 1.0 08/17/2011 0807   CALCIUM 8.3* 10/15/2011 1900   CALCIUM 8.6 08/17/2011 0807   GFRNONAA 87* 10/15/2011 1900   GFRAA >90 10/15/2011 1900    Lab Results  Component Value Date   INR 1.70* 10/16/2011   INR 2.44* 10/15/2011   INR 2.70* 11/11/2010     Imaging:  CT ankle July 1st: IMPRESSION:  1. Comminuted intra-articular fracture involving the posterior  malleolus region of the tibia. There is approximately 3.5 to 4 mm  of the step off at the articular surface.  2. Transverse fracture through the medial malleolus.  3. Comminuted and displaced distal fibular shaft fracture.  4. 9 mm loose fracture fragment in the posterior tibiotalar joint  space.   Physical Exam: Filed Vitals:   10/17/11 1435  BP: 104/61  Pulse: 63  Temp: 98.5 F (36.9 C)  Resp: 16  A&O x 3 Cor RRR Pulm - normal     Assessment/Plan: 1. Ortho - for surgery in AM.   2. A. Fib - INR 1.7 and she got more vitamin K today. Plan - pharmacy to dose lovenox - dose tonight  3. UTI - day #2 Septra.   Illene Regulus 10/17/2011, 6:22 PM

## 2011-10-18 ENCOUNTER — Inpatient Hospital Stay (HOSPITAL_COMMUNITY): Payer: Medicare Other

## 2011-10-18 ENCOUNTER — Encounter (HOSPITAL_COMMUNITY): Payer: Self-pay | Admitting: Anesthesiology

## 2011-10-18 ENCOUNTER — Encounter (HOSPITAL_COMMUNITY)
Admission: AD | Disposition: A | Discharge status: Skilled Nursing Facility | Payer: Self-pay | Source: Ambulatory Visit | Attending: Orthopedic Surgery

## 2011-10-18 ENCOUNTER — Inpatient Hospital Stay (HOSPITAL_COMMUNITY): Payer: Medicare Other | Admitting: Anesthesiology

## 2011-10-18 LAB — PROTIME-INR
INR: 1.12 (ref 0.00–1.49)
Prothrombin Time: 14.6 seconds (ref 11.6–15.2)

## 2011-10-18 SURGERY — OPEN REDUCTION INTERNAL FIXATION (ORIF) TIBIA/FIBULA FRACTURE
Anesthesia: General | Laterality: Left | Wound class: Clean

## 2011-10-18 MED ORDER — FENTANYL CITRATE 0.05 MG/ML IJ SOLN
INTRAMUSCULAR | Status: DC | PRN
  Administered 2011-10-18 (×2): 50 ug via INTRAVENOUS
  Administered 2011-10-18 (×2): 25 ug via INTRAVENOUS
  Administered 2011-10-18: 100 ug via INTRAVENOUS

## 2011-10-18 MED ORDER — HYDROMORPHONE HCL PF 1 MG/ML IJ SOLN
INTRAMUSCULAR | Status: AC
  Filled 2011-10-18: qty 1

## 2011-10-18 MED ORDER — BUPIVACAINE HCL (PF) 0.5 % IJ SOLN
INTRAMUSCULAR | Status: AC
  Filled 2011-10-18: qty 30

## 2011-10-18 MED ORDER — CEFAZOLIN SODIUM 1-5 GM-% IV SOLN
INTRAVENOUS | Status: DC | PRN
  Administered 2011-10-18: 2 g via INTRAVENOUS

## 2011-10-18 MED ORDER — SENNA 8.6 MG PO TABS
1.0000 | ORAL_TABLET | Freq: Two times a day (BID) | ORAL | Status: DC
  Administered 2011-10-18 – 2011-10-21 (×5): 8.6 mg via ORAL
  Filled 2011-10-18 (×5): qty 1

## 2011-10-18 MED ORDER — DOCUSATE SODIUM 100 MG PO CAPS
100.0000 mg | ORAL_CAPSULE | Freq: Two times a day (BID) | ORAL | Status: DC
  Administered 2011-10-18 – 2011-10-21 (×6): 100 mg via ORAL

## 2011-10-18 MED ORDER — WARFARIN SODIUM 5 MG PO TABS
5.0000 mg | ORAL_TABLET | Freq: Once | ORAL | Status: AC
  Administered 2011-10-18: 5 mg via ORAL
  Filled 2011-10-18: qty 1

## 2011-10-18 MED ORDER — PROMETHAZINE HCL 25 MG/ML IJ SOLN: 6.2500 mg | INTRAMUSCULAR | Status: DC | PRN

## 2011-10-18 MED ORDER — BACITRACIN ZINC 500 UNIT/GM EX OINT
TOPICAL_OINTMENT | CUTANEOUS | Status: AC
  Filled 2011-10-18: qty 15

## 2011-10-18 MED ORDER — 0.9 % SODIUM CHLORIDE (POUR BTL) OPTIME
TOPICAL | Status: DC | PRN
  Administered 2011-10-18: 1000 mL

## 2011-10-18 MED ORDER — BACITRACIN 500 UNIT/GM EX OINT
TOPICAL_OINTMENT | CUTANEOUS | Status: DC | PRN
  Administered 2011-10-18: 1 via TOPICAL

## 2011-10-18 MED ORDER — HYDROMORPHONE HCL PF 1 MG/ML IJ SOLN
0.2500 mg | INTRAMUSCULAR | Status: DC | PRN
  Administered 2011-10-18 (×2): 0.5 mg via INTRAVENOUS

## 2011-10-18 MED ORDER — LACTATED RINGERS IV SOLN
INTRAVENOUS | Status: DC | PRN
  Administered 2011-10-18 (×2): via INTRAVENOUS

## 2011-10-18 MED ORDER — METOCLOPRAMIDE HCL 5 MG/ML IJ SOLN: 5.0000 mg | Freq: Three times a day (TID) | INTRAMUSCULAR | Status: DC | PRN

## 2011-10-18 MED ORDER — METOCLOPRAMIDE HCL 10 MG PO TABS: 5.0000 mg | ORAL_TABLET | Freq: Three times a day (TID) | ORAL | Status: DC | PRN

## 2011-10-18 MED ORDER — LACTATED RINGERS IV SOLN: INTRAVENOUS | Status: DC

## 2011-10-18 MED ORDER — ONDANSETRON HCL 4 MG/2ML IJ SOLN
INTRAMUSCULAR | Status: DC | PRN
  Administered 2011-10-18 (×2): 2 mg via INTRAVENOUS

## 2011-10-18 MED ORDER — BUPIVACAINE LIPOSOME 1.3 % IJ SUSP
20.0000 mL | Freq: Once | INTRAMUSCULAR | Status: DC
  Filled 2011-10-18: qty 20

## 2011-10-18 MED ORDER — SUCCINYLCHOLINE CHLORIDE 20 MG/ML IJ SOLN
INTRAMUSCULAR | Status: DC | PRN
  Administered 2011-10-18: 100 mg via INTRAVENOUS

## 2011-10-18 MED ORDER — BUPIVACAINE HCL 0.5 % IJ SOLN
INTRAMUSCULAR | Status: DC | PRN
  Administered 2011-10-18: 30 mL

## 2011-10-18 MED ORDER — MIDAZOLAM HCL 5 MG/5ML IJ SOLN
INTRAMUSCULAR | Status: DC | PRN
  Administered 2011-10-18 (×4): 1 mg via INTRAVENOUS

## 2011-10-18 MED ORDER — LIDOCAINE HCL (CARDIAC) 20 MG/ML IV SOLN
INTRAVENOUS | Status: DC | PRN
  Administered 2011-10-18: 100 mg via INTRAVENOUS

## 2011-10-18 MED ORDER — SODIUM CHLORIDE 0.9 % IV SOLN
INTRAVENOUS | Status: DC
  Administered 2011-10-18: 1000 mL via INTRAVENOUS
  Administered 2011-10-19 (×2): via INTRAVENOUS

## 2011-10-18 MED ORDER — WARFARIN - PHARMACIST DOSING INPATIENT: Freq: Every day | Status: DC

## 2011-10-18 MED ORDER — PROPOFOL 10 MG/ML IV EMUL
INTRAVENOUS | Status: DC | PRN
  Administered 2011-10-18: 200 mg via INTRAVENOUS

## 2011-10-18 MED ORDER — ENOXAPARIN SODIUM 40 MG/0.4ML ~~LOC~~ SOLN
40.0000 mg | SUBCUTANEOUS | Status: DC
  Administered 2011-10-19 – 2011-10-21 (×3): 40 mg via SUBCUTANEOUS
  Filled 2011-10-18 (×4): qty 0.4

## 2011-10-18 SURGICAL SUPPLY — 71 items
BAG ZIPLOCK 12X15 (MISCELLANEOUS) ×2 IMPLANT
BANDAGE ELASTIC 4 VELCRO ST LF (GAUZE/BANDAGES/DRESSINGS) ×2 IMPLANT
BANDAGE ELASTIC 6 VELCRO ST LF (GAUZE/BANDAGES/DRESSINGS) ×2 IMPLANT
BANDAGE GAUZE ELAST BULKY 4 IN (GAUZE/BANDAGES/DRESSINGS) ×4 IMPLANT
BIT DRILL 2.5X2.75 QC CALB (BIT) ×2 IMPLANT
BIT DRILL CALIBRATED 2.7 (BIT) ×2 IMPLANT
BNDG COHESIVE 4X5 TAN STRL (GAUZE/BANDAGES/DRESSINGS) ×2 IMPLANT
BNDG COHESIVE 6X5 TAN STRL LF (GAUZE/BANDAGES/DRESSINGS) ×2 IMPLANT
CUFF TOURN SGL QUICK 34 (TOURNIQUET CUFF) ×1
CUFF TRNQT CYL 34X4X40X1 (TOURNIQUET CUFF) ×1 IMPLANT
DECANTER SPIKE VIAL GLASS SM (MISCELLANEOUS) ×2 IMPLANT
DRAPE C-ARM 42X72 X-RAY (DRAPES) IMPLANT
DRAPE C-ARMOR (DRAPES) ×2 IMPLANT
DRAPE OEC MINIVIEW 54X84 (DRAPES) ×2 IMPLANT
DRAPE ORTHO SPLIT 77X108 STRL (DRAPES)
DRAPE POUCH INSTRU U-SHP 10X18 (DRAPES) ×2 IMPLANT
DRAPE SURG ORHT 6 SPLT 77X108 (DRAPES) IMPLANT
DRAPE U-SHAPE 47X51 STRL (DRAPES) ×2 IMPLANT
DRSG ADAPTIC 3X8 NADH LF (GAUZE/BANDAGES/DRESSINGS) ×2 IMPLANT
DRSG PAD ABDOMINAL 8X10 ST (GAUZE/BANDAGES/DRESSINGS) ×2 IMPLANT
DURAPREP 26ML APPLICATOR (WOUND CARE) ×2 IMPLANT
ELECT REM PT RETURN 9FT ADLT (ELECTROSURGICAL) ×2
ELECTRODE REM PT RTRN 9FT ADLT (ELECTROSURGICAL) ×1 IMPLANT
FACESHIELD LNG OPTICON STERILE (SAFETY) IMPLANT
GLOVE BIO SURGEON STRL SZ8 (GLOVE) ×2 IMPLANT
GLOVE BIOGEL PI IND STRL 8 (GLOVE) ×1 IMPLANT
GLOVE BIOGEL PI INDICATOR 8 (GLOVE) ×1
GLOVE INDICATOR 8.0 STRL GRN (GLOVE) ×2 IMPLANT
GLOVE SURG ORTHO 8.5 STRL (GLOVE) ×2 IMPLANT
K-WIRE ACE 1.6X6 (WIRE) ×4
KIT BASIN OR (CUSTOM PROCEDURE TRAY) ×2 IMPLANT
KWIRE ACE 1.6X6 (WIRE) ×2 IMPLANT
MANIFOLD NEPTUNE II (INSTRUMENTS) ×2 IMPLANT
NEEDLE HYPO 22GX1.5 SAFETY (NEEDLE) ×2 IMPLANT
NEEDLE MAYO .5 CIRCLE (NEEDLE) IMPLANT
NS IRRIG 1000ML POUR BTL (IV SOLUTION) ×2 IMPLANT
PACK LOWER EXTREMITY WL (CUSTOM PROCEDURE TRAY) ×2 IMPLANT
PAD CAST 4YDX4 CTTN HI CHSV (CAST SUPPLIES) ×1 IMPLANT
PADDING CAST COTTON 4X4 STRL (CAST SUPPLIES) ×1
PADDING CAST COTTON 6X4 STRL (CAST SUPPLIES) ×2 IMPLANT
PLATE FIBULAR COMP LOCK 10H (Plate) ×2 IMPLANT
POSITIONER SURGICAL ARM (MISCELLANEOUS) ×2 IMPLANT
SCREW ACE CAN 4.0 42M (Screw) ×2 IMPLANT
SCREW ACE CAN 4.0 44M (Screw) ×4 IMPLANT
SCREW LOCK CORT STAR 3.5X10 (Screw) ×2 IMPLANT
SCREW LOCK CORT STAR 3.5X12 (Screw) ×2 IMPLANT
SCREW LOCK CORT STAR 3.5X14 (Screw) IMPLANT
SCREW LOCK CORT STAR 3.5X16 (Screw) ×2 IMPLANT
SCREW LOW PROFILE 12MMX3.5MM (Screw) ×4 IMPLANT
SCREW LOW PROFILE 3.5X48MM (Screw) ×2 IMPLANT
SCREW LP 3.5 (Screw) ×2 IMPLANT
SCREW NON LOCKING LP 3.5 16MM (Screw) ×2 IMPLANT
SPLINT FIBERGLASS 5X30 (CAST SUPPLIES) IMPLANT
SPLINT PLASTER CAST XFAST 5X30 (CAST SUPPLIES) ×1 IMPLANT
SPLINT PLASTER XFAST SET 5X30 (CAST SUPPLIES) ×1
SPONGE GAUZE 4X4 12PLY (GAUZE/BANDAGES/DRESSINGS) ×2 IMPLANT
SPONGE LAP 18X18 X RAY DECT (DISPOSABLE) IMPLANT
SPONGE LAP 4X18 X RAY DECT (DISPOSABLE) IMPLANT
STAPLER VISISTAT 35W (STAPLE) ×2 IMPLANT
STRIP CLOSURE SKIN 1/2X4 (GAUZE/BANDAGES/DRESSINGS) ×2 IMPLANT
SUCTION FRAZIER TIP 10 FR DISP (SUCTIONS) ×2 IMPLANT
SUT ETHIBOND NAB BRD #0 18IN (SUTURE) IMPLANT
SUT MNCRL AB 3-0 PS2 18 (SUTURE) ×2 IMPLANT
SUT MNCRL AB 4-0 PS2 18 (SUTURE) IMPLANT
SUT PROLENE 3 0 PS 2 (SUTURE) ×2 IMPLANT
SUT VIC AB 2-0 CT1 27 (SUTURE) ×1
SUT VIC AB 2-0 CT1 TAPERPNT 27 (SUTURE) ×1 IMPLANT
SUT VIC AB 3-0 PS2 18 (SUTURE)
SUT VIC AB 3-0 PS2 18XBRD (SUTURE) IMPLANT
SYR CONTROL 10ML LL (SYRINGE) ×2 IMPLANT
WATER STERILE IRR 1500ML POUR (IV SOLUTION) ×4 IMPLANT

## 2011-10-18 NOTE — Progress Notes (Signed)
Patient in bed awaiting transport to OR, husband at bedside. No s/s of acute distress noted

## 2011-10-18 NOTE — Brief Op Note (Signed)
10/15/2011 - 10/18/2011  6:18 PM  PATIENT:  Cynthia Frank  76 y.o. female  PRE-OPERATIVE DIAGNOSIS:  Left ankle trimalleolar fracture  POST-OPERATIVE DIAGNOSIS:   1.  Left ankle trimalleolar fracture 2.  Left ankle syndesmosis disruption  Procedure(s): 1.  ORIF left ankle trimalleolar fracture with fixation of posterior malleolus fracture 2.  ORIF left ankle syndesmosis disruption 3.  Fluoro > 1 hour 4.  Stress exam of left ankle under fluoroscopy  SURGEON:  Toni Arthurs, MD  ASSISTANT: n/a  ANESTHESIA:   General  EBL:  minimal   TOURNIQUET:   Total Tourniquet Time Documented: Thigh (Left) - 84 minutes  COMPLICATIONS:  None apparent  DISPOSITION:  Extubated, awake and stable to recovery.  DICTATION ID:  [id number]

## 2011-10-18 NOTE — Progress Notes (Signed)
ANTICOAGULATION CONSULT NOTE - Initial Consult  Pharmacy Consult for Warfarin Indication: atrial fibrillation s/p L ORIF  Allergies  Allergen Reactions  . Clindamycin/Lincomycin     itching  . Sulfonamide Derivatives     REACTION: Itching/Swelling    Patient Measurements: Height: 5\' 5"  (165.1 cm) Weight: 150 lb (68.04 kg) IBW/kg (Calculated) : 57    Vital Signs: Temp: 98.2 F (36.8 C) (07/02 1932) Temp src: Oral (07/02 1258) BP: 134/76 mmHg (07/02 1932) Pulse Rate: 64  (07/02 1932)  Labs:  Basename 10/18/11 0403 10/16/11 0424  HGB -- --  HCT -- --  PLT -- --  APTT -- --  LABPROT 14.6 20.3*  INR 1.12 1.70*  HEPARINUNFRC -- --  CREATININE -- --  CKTOTAL -- --  CKMB -- --  TROPONINI -- --    Estimated Creatinine Clearance: 56.4 ml/min (by C-G formula based on Cr of 0.62).   Medical History: Past Medical History  Diagnosis Date  . Hx: UTI (urinary tract infection)   . Nonischemic cardiomyopathy     chemo related  . History of ovarian cancer 1985  . Hypertension   . Partial bowel obstruction   . Internal hemorrhoid   . Colon cancer 03/2006    T3, N0  . Atrial fibrillation     Medications:  Scheduled:    . amiodarone  200 mg Oral QPC supper  . atorvastatin  10 mg Oral q1800  . ciprofloxacin  250 mg Oral BID  . docusate sodium  100 mg Oral BID  . enoxaparin  40 mg Subcutaneous Q24H  . enoxaparin (LOVENOX) injection  1 mg/kg Subcutaneous Once  . furosemide  20 mg Oral Daily  . HYDROmorphone      . metoprolol tartrate  50 mg Oral BID  . mupirocin ointment  1 application Nasal BID  . omega-3 acid ethyl esters  1 g Oral Custom  . potassium chloride SA  40 mEq Oral Daily  . ramipril  5 mg Oral QPC supper  . senna  1 tablet Oral BID  . tretinoin   Topical QHS  . DISCONTD: bupivacaine liposome  20 mL Infiltration Once  . DISCONTD:  ceFAZolin (ANCEF) IV  2 g Intravenous 60 min Pre-Op  . DISCONTD: metoprolol succinate  100 mg Oral BID   Infusions:     . sodium chloride 1,000 mL (10/18/11 1919)  . DISCONTD: sodium chloride Stopped (10/18/11 1407)  . DISCONTD: lactated ringers    . DISCONTD: lactated ringers     PRN: acetaminophen, acetaminophen, alum & mag hydroxide-simeth, HYDROcodone-acetaminophen, HYDROmorphone (DILAUDID) injection, menthol-cetylpyridinium, methocarbamol (ROBAXIN) IV, methocarbamol, metoCLOPramide (REGLAN) injection, metoCLOPramide, ondansetron (ZOFRAN) IV, ondansetron, phenol, zolpidem, DISCONTD: 0.9 % irrigation (POUR BTL), DISCONTD: bacitracin, DISCONTD: bupivacaine, DISCONTD:  HYDROmorphone (DILAUDID) injection, DISCONTD: promethazine  Assessment: Pt was on Coumadin 2.5mg  on Tues and Sun and 5mg  on other days (last dose 6/29) for Afib and received Lovenox 70mg  SQ x 1 as a bridge last night for L ORIF today. Pt received Vit K (10mg  IV on 6/29 and 5 mg IV on 10/17/11). Admitting INR was 2.44. Coumadin to resume tonight.  Goal of Therapy:  INR 2-3   Plan:  . Will give Coumadin 5mg  po x 1 tonight . Will check daily PT/INR while on Coumadin as pt on concurrent Cipro and Amiodarone (home med) which have been shown to increase warfarin effect. . Pt on concurrent Lovenox 40mg  SQ Q24h to start tomorrow at 8am and to be d/ced when INR>/=1.8. Dorethea Clan 10/18/2011,7:53  PM   

## 2011-10-18 NOTE — Transfer of Care (Signed)
Immediate Anesthesia Transfer of Care Note  Patient: Cynthia Frank  Procedure(s) Performed: Procedure(s) (LRB): OPEN REDUCTION INTERNAL FIXATION (ORIF) TIBIA/FIBULA FRACTURE (Left)  Patient Location: PACU  Anesthesia Type: General  Level of Consciousness: awake, alert , oriented and patient cooperative  Airway & Oxygen Therapy: Patient Spontanous Breathing and Patient connected to face mask oxygen  Post-op Assessment: Report given to PACU RN, Post -op Vital signs reviewed and stable and Patient moving all extremities  Post vital signs: Reviewed and stable  Complications: No apparent anesthesia complications

## 2011-10-18 NOTE — Preoperative (Signed)
Beta Blockers   Reason not to administer Beta Blockers:Lopressor held d/t npo, will monitor and give beta blocker as needed during periop. period.

## 2011-10-18 NOTE — Progress Notes (Signed)
Clinical Social Work Department CLINICAL SOCIAL WORK PLACEMENT NOTE 10/18/2011  Patient:  Cynthia Frank, Cynthia Frank  Account Number:  [id number] Admit date:  10/15/2011  Clinical Social Worker:  [name], LCSW  Date/time:  10/18/2011 07:35 AM  Clinical Social Work is seeking post-discharge placement for this patient at the following level of care:   SKILLED NURSING   (*CSW will update this form in Epic as items are completed)   10/17/2011  Patient/family provided with [facility] Department of Clinical Social Work's list of facilities offering this level of care within the geographic area requested by the patient (or if unable, by the patient's family).  10/17/2011  Patient/family informed of their freedom to choose among providers that offer the needed level of care, that participate in Medicare, Medicaid or managed care program needed by the patient, have an available bed and are willing to accept the patient.    Patient/family informed of [facility]' ownership interest in [facility], as well as of the fact that they are under no obligation to receive care at this facility.  PASARR submitted to EDS on 10/18/2011 PASARR number received from [employer] on   FL2 transmitted to all facilities in geographic area requested by pt/family on  10/18/2011 FL2 transmitted to all facilities within larger geographic area on   Patient informed that his/her managed care company has contracts with or will negotiate with  certain facilities, including the following:     Patient/family informed of bed offers received:   Patient chooses bed at  Physician recommends and patient chooses bed at    Patient to be transferred to  on   Patient to be transferred to facility by   The following physician request were entered in Epic:   Additional Comments:  [name] LCSW [phone]

## 2011-10-18 NOTE — Anesthesia Preprocedure Evaluation (Signed)
Anesthesia Evaluation  Patient identified by MRN, date of birth, ID band Patient awake    Reviewed: Allergy & Precautions, H&P , NPO status , Patient's Chart, lab work & pertinent test results  Airway Mallampati: II TM Distance: >3 FB Neck ROM: Full    Dental No notable dental hx.    Pulmonary neg pulmonary ROS,  breath sounds clear to auscultation  Pulmonary exam normal       Cardiovascular hypertension, + dysrhythmias Atrial Fibrillation Rhythm:Regular Rate:Normal  Chemo induced cardiomyopathy   Neuro/Psych negative neurological ROS  negative psych ROS   GI/Hepatic negative GI ROS, Neg liver ROS,   Endo/Other  negative endocrine ROS  Renal/GU negative Renal ROS  negative genitourinary   Musculoskeletal negative musculoskeletal ROS (+)   Abdominal   Peds negative pediatric ROS (+)  Hematology negative hematology ROS (+)   Anesthesia Other Findings   Reproductive/Obstetrics negative OB ROS                           Anesthesia Physical Anesthesia Plan  ASA: III  Anesthesia Plan: General   Post-op Pain Management:    Induction: Intravenous  Airway Management Planned: LMA and Oral ETT  Additional Equipment:   Intra-op Plan:   Post-operative Plan: Extubation in OR  Informed Consent: I have reviewed the patients History and Physical, chart, labs and discussed the procedure including the risks, benefits and alternatives for the proposed anesthesia with the patient or authorized representative who has indicated his/her understanding and acceptance.   Dental advisory given  Plan Discussed with: CRNA  Anesthesia Plan Comments:         Anesthesia Quick Evaluation

## 2011-10-18 NOTE — Progress Notes (Signed)
Subjective: [name] is ready for surgery. She has no questions  Objective: Lab: Lab Results  Component Value Date   INR 1.12 10/18/2011   INR 1.70* 10/16/2011   INR 2.44* 10/15/2011    Imaging: CT ankle from July 1 noted.  Physical Exam: Filed Vitals:   10/18/11 0800  BP:   Pulse:   Temp:   Resp: 16  no physical exam.    Assessment/Plan: 1. Ortho - for surgery this afternoon. INR is down to 1.12  2. A. Fib - holding sinus. 2 D echo is listed as done but no report is available!  3. UTI #3 septra  Will look for her this evening post-op. Illene Regulus 10/18/2011, 8:27 AM

## 2011-10-18 NOTE — Interval H&P Note (Signed)
History and Physical Interval Note:  10/18/2011 3:40 PM  Cynthia Frank  has presented today for surgery, with the diagnosis of Left Tibia Pilon and Fibula Fracture  The various methods of treatment have been discussed with the patient and family. After consideration of risks, benefits and other options for treatment, the patient has consented to  Procedure(s) (LRB): OPEN REDUCTION INTERNAL FIXATION (ORIF) TIBIA/FIBULA FRACTURE (Left) as a surgical intervention .  The patient's history has been reviewed, patient examined, no change in status, stable for surgery.  I have reviewed the patients' chart and labs.  Questions were answered to the patient's satisfaction.     Toni Arthurs

## 2011-10-19 LAB — PROTIME-INR
INR: 1.13 (ref 0.00–1.49)
Prothrombin Time: 14.7 seconds (ref 11.6–15.2)

## 2011-10-19 MED ORDER — WARFARIN SODIUM 6 MG PO TABS
6.0000 mg | ORAL_TABLET | Freq: Once | ORAL | Status: AC
  Administered 2011-10-19: 6 mg via ORAL
  Filled 2011-10-19: qty 1

## 2011-10-19 NOTE — Evaluation (Signed)
Physical Therapy Evaluation Patient Details Name: Cynthia Frank MRN: [id number] DOB: April 10, 1936 Today's Date: 10/19/2011 Time: 4098-1191 PT Time Calculation (min): 18 min  PT Assessment / Plan / Recommendation Clinical Impression  Pt admitted after fall sustaining L trimalleolar fx requiring ORIF.  Pt would benefit from acute PT services in order to improve independence and activity tolerance with transfers and ambulation while maintaining NWB L LE.    PT Assessment  Patient needs continued PT services    Follow Up Recommendations  Skilled nursing facility    Barriers to Discharge        Equipment Recommendations  Defer to next venue    Recommendations for Other Services     Frequency Min 4X/week    Precautions / Restrictions Precautions Precautions: Fall Precaution Comments: contact precautions Restrictions Weight Bearing Restrictions: Yes LLE Weight Bearing: Non weight bearing   Pertinent Vitals/Pain 4/10 L ankle/lower leg pain, ambulated then elevated LE      Mobility  Bed Mobility Bed Mobility: Supine to Sit Supine to Sit: 4: Min assist Details for Bed Mobility Assistance: min verbal cues and slight assist to guide L LE over to EOB. Transfers Sit to Stand: 4: Min assist;With upper extremity assist;From bed;From chair/3-in-1 Stand to Sit: 4: Min assist;With upper extremity assist;To chair/3-in-1 Details for Transfer Assistance: verbal cues for hand placement and to back up completely to surface before trying to sit down. Ambulation/Gait Ambulation/Gait Assistance: 4: Min assist Ambulation Distance (Feet): 10 Feet (x2) Assistive device: Rolling walker Ambulation/Gait Assistance Details: pt ambulated to/from bathroom, declined ambulation in hallway today.  verbal cues for safe use of RW and technique, pt does well maintaining NWB L LE, swing to gait pattern Gait velocity: decreased    Exercises     PT Diagnosis: Difficulty walking;Acute pain  PT Problem  List: Decreased activity tolerance;Decreased strength;Decreased mobility;Decreased balance;Decreased safety awareness;Decreased knowledge of use of DME;Pain PT Treatment Interventions: DME instruction;Gait training;Functional mobility training;Therapeutic activities;Therapeutic exercise;Balance training;Patient/family education;Wheelchair mobility training   PT Goals Acute Rehab PT Goals PT Goal Formulation: With patient Time For Goal Achievement: 10/26/11 Potential to Achieve Goals: Good Pt will go Supine/Side to Sit: with supervision PT Goal: Supine/Side to Sit - Progress: Goal set today Pt will go Sit to Supine/Side: with supervision PT Goal: Sit to Supine/Side - Progress: Goal set today Pt will go Sit to Stand: with supervision PT Goal: Sit to Stand - Progress: Goal set today Pt will go Stand to Sit: with supervision PT Goal: Stand to Sit - Progress: Goal set today Pt will Ambulate: 51 - 150 feet;with supervision;with least restrictive assistive device PT Goal: Ambulate - Progress: Goal set today Pt will Propel Wheelchair: > 150 feet;with modified independence PT Goal: Propel Wheelchair - Progress: Goal set today  Visit Information  Last PT Received On: 10/19/11 Assistance Needed: +1 PT/OT Co-Evaluation/Treatment: Yes    Subjective Data  Subjective: "I don't think I should go home right away.  I think it would be better to go somewhere first."   Prior Functioning  Home Living Lives With: Spouse Available Help at Discharge: Skilled Nursing Facility Additional Comments: Pt plans to d/c to SNF. Prior Function Level of Independence: Independent Communication Communication: No difficulties    Cognition  Overall Cognitive Status: Appears within functional limits for tasks assessed/performed Arousal/Alertness: Awake/alert Orientation Level: Appears intact for tasks assessed Behavior During Session: [facility] for tasks performed    Extremity/Trunk Assessment Right Upper Extremity  Assessment RUE ROM/Strength/Tone: [facility] for tasks assessed Left Upper Extremity Assessment  LUE ROM/Strength/Tone: [facility] for tasks assessed Right Lower Extremity Assessment RLE ROM/Strength/Tone: [facility] for tasks assessed Left Lower Extremity Assessment LLE ROM/Strength/Tone: Deficits;Unable to fully assess LLE ROM/Strength/Tone Deficits: pt with cast to lower leg, able to wiggle toes   Balance Balance Balance Assessed: Yes Dynamic Standing Balance Dynamic Standing - Level of Assistance: 4: Min assist;Other (comment) (to pull up gown)  End of Session PT - End of Session Activity Tolerance: Patient tolerated treatment well Patient left: in chair;with call bell/phone within reach;with nursing in room  GP     Muskaan Smet,[name] 10/19/2011, 11:39 AM Pager: [phone]

## 2011-10-19 NOTE — Progress Notes (Signed)
Subjective: 1 Day Post-Op Procedure(s) (LRB): OPEN REDUCTION INTERNAL FIXATION (ORIF) TIBIA/FIBULA FRACTURE (Left) Patient reports pain as mild.   Well controlled with oral pain meds.  No n/v/f/c.  Objective: Vital signs in last 24 hours: Temp:  [97.8 F (36.6 C)-99.5 F (37.5 C)] 97.9 F (36.6 C) (07/03 1059) Pulse Rate:  [60-75] 75  (07/03 1059) Resp:  [14-16] 16  (07/03 1059) BP: (104-134)/(59-76) 113/66 mmHg (07/03 1059) SpO2:  [91 %-100 %] 96 % (07/03 1059) FiO2 (%):  [2 %] 2 % (07/02 2245)  Intake/Output from previous day: 07/02 0701 - 07/03 0700 In: 4760 [P.O.:360; I.V.:4400] Out: 3170 [Urine:3150; Blood:20] Intake/Output this shift: Total I/O In: 240 [P.O.:240] Out: -   No results found for this basename: HGB:5 in the last 72 hours No results found for this basename: WBC:2,RBC:2,HCT:2,PLT:2 in the last 72 hours No results found for this basename: NA:2,K:2,CL:2,CO2:2,BUN:2,CREATININE:2,GLUCOSE:2,CALCIUM:2 in the last 72 hours  Basename 10/19/11 0343 10/18/11 0403  LABPT -- --  INR 1.13 1.12    L ankle splinted. NVI at toes.  Assessment/Plan: 1 Day Post-Op Procedure(s) (LRB): OPEN REDUCTION INTERNAL FIXATION (ORIF) TIBIA/FIBULA FRACTURE (Left) Discharge to [facility] Friday.  Continue PT / OT.  Cipro for another day.  Continue coumadin and lovenox.  Toni Arthurs 10/19/2011, 1:31 PM

## 2011-10-19 NOTE — Progress Notes (Addendum)
ANTICOAGULATION CONSULT NOTE - Follow Up Consult  Pharmacy Consult for Warfarin Indication: atrial fibrillation s/p L ORIF  Allergies  Allergen Reactions  . Clindamycin/Lincomycin     itching  . Sulfonamide Derivatives     REACTION: Itching/Swelling   Patient Measurements: Height: 5\' 5"  (165.1 cm) Weight: 150 lb (68.04 kg) IBW/kg (Calculated) : 57   Vital Signs: Temp: 98.7 F (37.1 C) (07/03 0619) Temp src: Oral (07/03 0619) BP: 104/59 mmHg (07/03 0619) Pulse Rate: 68  (07/03 0619)  Labs:  Basename 10/19/11 0343 10/18/11 0403  HGB -- --  HCT -- --  PLT -- --  APTT -- --  LABPROT 14.7 14.6  INR 1.13 1.12  HEPARINUNFRC -- --  CREATININE -- --  CKTOTAL -- --  CKMB -- --  TROPONINI -- --   Estimated Creatinine Clearance: 56.4 ml/min (by C-G formula based on Cr of 0.62).  Assessment:  76 y/o female on Coumadin prior to admission for h/o atrial fibrillation.  Last dose of Coumadin 6/29, pt received Lovenox bridge and L ORIF done 7/2. Of note, admitting INR was 2.44 and patient received Vitamin K 10 mg IV x 1 on 6/29 and 5 mg IV x 1 on 7/1.   Reported PTA Coumadin dose:  2.5 mg on Tues/Sun and 5 mg on other days   Coumadin resumed 7/2 s/p L ORIF with concurrent Lovenox 40 mg sq q24h hours INR >/= 1.8  Drug interaction noted with Cipro and Amiodarone (home med)  INR 1.13, as expected with coumadin re-initiation and s/p vitamin K administration.  CBC wnl on 6/29, no bleeding/complications noted  Goal of Therapy:  INR 2-3  Plan:   Coumadin 6 mg po x 1 tonight  F/u to discontinue Lovenox when INR >/= 1.8  Daily PT/INR, pharmacy will f/u  Geoffry Paradise, PharmD, BCPS Pager: [phone] 9:32 AM

## 2011-10-19 NOTE — Op Note (Signed)
Cynthia Frank, Cynthia Frank            ACCOUNT NO.:  [id number]  MEDICAL RECORD NO.:  [id number]  LOCATION:  1604                         FACILITY:  [facility]  PHYSICIAN:  Toni Arthurs, MD        DATE OF BIRTH:  [birth year]  DATE OF PROCEDURE:  10/18/2011 DATE OF DISCHARGE:                              OPERATIVE REPORT   PREOPERATIVE DIAGNOSIS:  Left ankle trimalleolar fracture.  POSTOPERATIVE DIAGNOSES: 1. Left ankle trimalleolar fracture. 2. Left ankle syndesmosis disruption.  PROCEDURE: 1. Open reduction and internal fixation of left ankle trimalleolar     fracture with fixation of posterior malleolus fracture. 2. Open reduction and internal fixation of left ankle syndesmosis     disruption. 3. Intraoperative interpretation of fluoroscopic imaging greater than     1 hour. 4. Stress examination of left ankle under fluoroscopy.  SURGEON:  Toni Arthurs, MD  ANESTHESIA:  General.  ESTIMATED BLOOD LOSS:  Minimal.  TOURNIQUET TIME:  84 minutes at 225 mmHg.  COMPLICATIONS:  None apparent.  DISPOSITION:  Extubated, awake, and stable to recovery.  INDICATIONS FOR PROCEDURE:  The patient is a 76 year old woman who injured her left ankle approximately 5 days ago.  She was noted to have a fracture dislocation of the left ankle.  She underwent closed reduction by Dr. Darrelyn Hillock on Sunday, June 30.  She returned to the operating room for open reduction and internal fixation of this comminuted unstable trimalleolar ankle fracture.  CT scan was obtained preoperatively to evaluate the distal tibia due to concern for a tibial pilon fracture.  She was found to have a comminuted trimalleolar fracture.  She understands the risks and benefits, the alternative treatment options and elects surgical treatment.  She specifically understands risks of bleeding, infection, nerve damage, blood clots, need for additional surgery, amputation, and death.  PROCEDURE IN DETAIL:  After preoperative consent was  obtained, the correct operative site was identified.  The patient was brought to the operating room and placed supine on the operating table.  General anesthesia was induced.  Preoperative antibiotics were administered. Surgical time-out was taken.  The left lower extremity was prepped and draped in standard sterile fashion and tourniquet around the thigh.  The extremity was exsanguinated and tourniquet was inflated to 225 mmHg.  A longitudinal incision was made on the lateral aspect of the fibula. Sharp dissection was carried down through the skin.  Blunt dissection was carried down through the subcutaneous tissue to the level of the fracture.  The fracture was noted to have central area of segmental comminution.  A locking fibular plate was selected from the [employer] composite plates.  This was applied on the lateral aspect of the fibula and pinned into position.  The fibula was pulled out to length and the fracture site was clamped with the plate.  The plate was pinned into position proximally and distally.  AP and lateral fluoroscopic images were obtained showing appropriate reduction of the fibula.  Prior to application, the plate was contoured appropriately.  At this point, the plate was fixed with a nonlocking screw to pull the plate down to the lateral aspect of the bone.  Proximally, it was fixed with a nonlocking screw in  bicortical fashion through the oblong hole.  AP and lateral fluoroscopic images again showed appropriate position of the plate and appropriate reduction of the fracture.  The plate was then fixed distally with 3 more unicortical locking screws.  The previously placed nonlocking screw was replaced with a locking screw again in unicortical fashion.  Proximally 2 more bicortical screws were inserted and all were noted to have excellent purchase.  At this point, attention was turned to the posterior malleolus.  On the lateral view, it was noted to still be subluxated  slightly proximally.  A stab incision was made over the anterior aspect of the distal tibia at the level of the physeal scar.  A Weber tenaculum was inserted around the posterior aspect of the fibula onto the posterior malleolus fragment and then anteriorly through the previously made stab incision.  An elevator was placed down through the fracture site posterolaterally and the elevator was used to push the fragment distally.  It was then clamped with the tenaculum.  A 1.6 mm K- wire was then inserted through the incision anteriorly and passed across the fracture site.  A 4 mm partially threaded cannulated screw was then inserted over the guidewire.  It secured the posterior malleolus fragment in an appropriately reduced position and was noted to have excellent purchase.  Attention was then turned to the medial malleolus fracture.  A K-wire was inserted percutaneously into the tip of the malleolus.  The K-wire was used as a joystick to reduce the medial malleolus fracture and the pin was advanced across the fracture site into the distal tibial metaphyseal bone.  AP and lateral views showed appropriate reduction of the fracture and appropriate position of the guide pin.  A second guidepin was placed adjacent to the first.  AP and lateral views again showed appropriate position of both guide pins.  Both were then used to insert 4 mm x 44 mm partially threaded cannulated screws.  These were both noted to have excellent purchase and reduced the fracture and compressed it appropriately.  Both guide pins were then removed.  Mortise view was then obtained.  Under live fluoroscopic imaging, dorsiflexion and external rotation stress was applied.  The syndesmosis was noted to widen several mm.  This indicated a disrupted syndesmosis. Incision was made at that time to proceed with open reduction and internal fixation of the syndesmosis.  A stab incision was made over the distal tibia medially.   The Weber tenaculum was then placed across the syndesmosis and used to compress from the fibula laterally to the tibia medially.  AP and lateral views showed appropriate reduction of the syndesmosis.  Two 3.5 mm fully-threaded nonlocking screws were then inserted through the plate and across the syndesmosis.  Both were noted to have appropriate purchase.  The most distal of these was placed through the most proximal hole where a locking screw had been previously.  This locking screw was removed to allow placement of the syndesmosis screw.  Final AP, lateral, and mortise views showed appropriate position and length of all hardware and appropriate reduction of the fractures.  All wounds were irrigated copiously. Horizontal mattress sutures of 3-0 nylon were used to close the stab incisions.  A 2-0 Vicryl was used to approximate the periosteum over the plate.  Subcutaneous tissue was approximated with inverted simple sutures of 3-0 Monocryl.  A running 3-0 Prolene was used to close the skin incision.  Sterile dressings were applied followed by a well-padded short-leg splint.  The  tourniquet was released at 84 minutes.  The patient was then awakened from anesthesia and transported to the recovery room in stable condition.  Prior to applying the splint, 30 mL of 0.5% Marcaine plain was infiltrated in the subcutaneous tissue at all the incision sites.  FOLLOWUP PLAN:  The patient will be admitted back to the inpatient ward. She will have physical therapy and occupational therapy consults and likely will require placement in a skilled nursing facility for acute rehab.     Toni Arthurs, MD     JH/MEDQ  D:  10/18/2011  T:  10/19/2011  Job:  [id number]

## 2011-10-19 NOTE — Evaluation (Signed)
Occupational Therapy Evaluation Patient Details Name: Cynthia Frank MRN: [id number] DOB: 1935-12-04 Today's Date: 10/19/2011 Time: 1191-4782 OT Time Calculation (min): 20 min  OT Assessment / Plan / Recommendation Clinical Impression  Pt is s/p ankle fracture and underwent ORIF on 10/18/11. Displays decreased strength, activity tolerance and increased pain and Will benefit from skilled OT services to improve ADL independence for next venue of care.    OT Assessment  Patient needs continued OT Services    Follow Up Recommendations  Skilled nursing facility    Barriers to Discharge      Equipment Recommendations  Defer to next venue    Recommendations for Other Services    Frequency  Min 2X/week    Precautions / Restrictions Precautions Precautions: Fall Precaution Comments: contact precautions Restrictions Weight Bearing Restrictions: Yes LLE Weight Bearing: Non weight bearing        ADL  Eating/Feeding: Simulated;Independent Where Assessed - Eating/Feeding: Chair Grooming: Simulated;Set up Where Assessed - Grooming: Supported sitting Upper Body Bathing: Simulated;Chest;Right arm;Left arm;Abdomen;Set up Where Assessed - Upper Body Bathing: Unsupported sitting Lower Body Bathing: Simulated;Minimal assistance Where Assessed - Lower Body Bathing: Supported sit to stand Upper Body Dressing: Simulated;Set up Where Assessed - Upper Body Dressing: Unsupported sitting Lower Body Dressing: Simulated;Minimal assistance Where Assessed - Lower Body Dressing: Supported sit to stand Toilet Transfer: Performed;Minimal assistance Toilet Transfer Method: Other (comment) (ambulating) Toilet Transfer Equipment: Raised toilet seat with arms (or 3-in-1 over toilet) Toileting - Clothing Manipulation and Hygiene: Simulated;Minimal assistance Where Assessed - Glass blower/designer Manipulation and Hygiene: Sit to stand from 3-in-1 or toilet Tub/Shower Transfer Method: Not assessed Equipment  Used: Rolling walker ADL Comments: Pt with some impulsiveness--trying to sit down before fully backed up to the chair even after cues given and letting go of RW with one hand and trying to reach for grab bar in bathroom. Pt needs cues for hand placement with transitions from sit to stand and stand to sit also. Pt motivated and wants SNF to return home more independent.    OT Diagnosis: Generalized weakness  OT Problem List: Decreased strength;Pain;Decreased knowledge of use of DME or AE;Decreased activity tolerance OT Treatment Interventions: Self-care/ADL training;Therapeutic activities;DME and/or AE instruction;Patient/family education   OT Goals Acute Rehab OT Goals OT Goal Formulation: With patient Time For Goal Achievement: 10/26/11 Potential to Achieve Goals: Good ADL Goals Pt Will Perform Grooming: with supervision;Standing at sink ADL Goal: Grooming - Progress: Goal set today Pt Will Perform Lower Body Bathing: with supervision;Sit to stand from chair;Sit to stand from bed ADL Goal: Lower Body Bathing - Progress: Goal set today Pt Will Perform Lower Body Dressing: with supervision;Sit to stand from chair;Sit to stand from bed ADL Goal: Lower Body Dressing - Progress: Goal set today Pt Will Transfer to Toilet: with supervision;with DME;Ambulation;3-in-1 ADL Goal: Toilet Transfer - Progress: Goal set today Pt Will Perform Toileting - Clothing Manipulation: with supervision;Standing ADL Goal: Toileting - Clothing Manipulation - Progress: Goal set today  Visit Information  Last OT Received On: 10/19/11 Assistance Needed: +1 PT/OT Co-Evaluation/Treatment: Yes    Subjective Data  Subjective: I have gotten up to the [facility] Patient Stated Goal: to go to rehab and return home more independent   Prior Functioning  Home Living Lives With: Spouse Available Help at Discharge: Skilled Nursing Facility Additional Comments: Pt plans to d/c to SNF. Prior Function Level of Independence:  Independent Communication Communication: No difficulties    Cognition  Overall Cognitive Status: Appears within functional limits for tasks  assessed/performed Arousal/Alertness: Awake/alert Orientation Level: Appears intact for tasks assessed Behavior During Session: [facility] for tasks performed    Extremity/Trunk Assessment Right Upper Extremity Assessment RUE ROM/Strength/Tone: [facility] for tasks assessed Left Upper Extremity Assessment LUE ROM/Strength/Tone: WFL for tasks assessed Right Lower Extremity Assessment RLE ROM/Strength/Tone: [facility] for tasks assessed Left Lower Extremity Assessment LLE ROM/Strength/Tone: Deficits;Unable to fully assess LLE ROM/Strength/Tone Deficits: pt with cast to lower leg, able to wiggle toes   Mobility Bed Mobility Bed Mobility: Supine to Sit Supine to Sit: 4: Min assist Details for Bed Mobility Assistance: min verbal cues and slight assist to guide L LE over to EOB. Transfers Transfers: Sit to Stand;Stand to Sit Sit to Stand: 4: Min assist;With upper extremity assist;From bed;From chair/3-in-1 Stand to Sit: 4: Min assist;With upper extremity assist;To chair/3-in-1 Details for Transfer Assistance: verbal cues for hand placement and to back up completely to surface before trying to sit down.   Exercise    Balance Balance Balance Assessed: Yes Dynamic Standing Balance Dynamic Standing - Level of Assistance: 4: Min assist;Other (comment) (to pull up gown)  End of Session OT - End of Session Activity Tolerance: Patient tolerated treatment well Patient left: in chair;with call bell/phone within reach  GO     Lennox Laity [phone]  10/19/2011, 11:33 AM

## 2011-10-19 NOTE — Progress Notes (Signed)
Subjective: Mrs. Myrick reports that she feels OK, some pain in the ankle  Objective: Lab: No new lab Imaging: Post-op films reviewed  Physical Exam: Filed Vitals:   10/19/11 0619  BP: 104/59  Pulse: 68  Temp: 98.7 F (37.1 C)  Resp: 16   PUlm - good breath sounds Cor- RRR    Assessment/Plan: 1. Ortho- POD #1 - plans per ortho  2. A. Fib - holding sinus rhythm. Will restart coumadin - pharmacy to assist  3. UTI #4/5 septra  dispo CIR vs SNF vs Home with [facility]   Michael Norins 10/19/2011, 8:42 AM

## 2011-10-20 LAB — PROTIME-INR
INR: 1.18 (ref 0.00–1.49)
Prothrombin Time: 15.2 seconds (ref 11.6–15.2)

## 2011-10-20 MED ORDER — BISACODYL 10 MG RE SUPP: 10.0000 mg | Freq: Every day | RECTAL | Status: DC | PRN

## 2011-10-20 MED ORDER — WARFARIN SODIUM 10 MG PO TABS
10.0000 mg | ORAL_TABLET | Freq: Once | ORAL | Status: AC
  Administered 2011-10-20: 10 mg via ORAL
  Filled 2011-10-20: qty 1

## 2011-10-20 MED ORDER — BISACODYL 5 MG PO TBEC
DELAYED_RELEASE_TABLET | ORAL | Status: AC
  Filled 2011-10-20: qty 2

## 2011-10-20 MED ORDER — BISACODYL 5 MG PO TBEC
10.0000 mg | DELAYED_RELEASE_TABLET | Freq: Every day | ORAL | Status: DC | PRN
  Administered 2011-10-20: 10 mg via ORAL
  Filled 2011-10-20: qty 1

## 2011-10-20 MED ORDER — FLUTICASONE PROPIONATE 50 MCG/ACT NA SUSP
1.0000 | Freq: Every day | NASAL | Status: DC
  Administered 2011-10-20 – 2011-10-21 (×2): 1 via NASAL
  Filled 2011-10-20: qty 16

## 2011-10-20 NOTE — Progress Notes (Signed)
Occupational Therapy Treatment Patient Details Name: Cynthia Frank MRN: [id number] DOB: [birth year] Today's Date: 10/20/2011 Time: 4098-1191 OT Time Calculation (min): 21 min  OT Assessment / Plan / Recommendation Comments on Treatment Session Pt doing well but continues to need reinforcement of safety precautions to decrease fall risk. Plan is for SNF    Follow Up Recommendations  Skilled nursing facility    Barriers to Discharge       Equipment Recommendations  Defer to next venue    Recommendations for Other Services    Frequency Min 2X/week   Plan Discharge plan remains appropriate    Precautions / Restrictions Precautions Precautions: Fall Precaution Comments: contact precautions Restrictions Weight Bearing Restrictions: Yes LLE Weight Bearing: Non weight bearing        ADL  Grooming: Performed;Teeth care;Minimal assistance Where Assessed - Grooming: Supported standing Toilet Transfer: Performed;Minimal Dentist Method: Other (comment) (ambulating) Toilet Transfer Equipment: Raised toilet seat with arms (or 3-in-1 over toilet) Toileting - Clothing Manipulation and Hygiene: Simulated;Minimal assistance Where Assessed - Toileting Clothing Manipulation and Hygiene: Sit to stand from 3-in-1 or toilet ADL Comments: Pt continues to need min assist to stabilize with sit to stand transitions as she is alittle unsteady initially. She also needs reinforcement with hand placement with both sit to stand and to sit. She continues to try to sit before fully backed up to the surface she will be sitting up. Worked on reinforcing all these safety precautios with pt to decrease fall risk. Pt verbalized understanding. She also needs some assist to negotiate tight space in bathroom with RW and cues for distance of self to RW    OT Diagnosis:    OT Problem List:   OT Treatment Interventions:     OT Goals ADL Goals ADL Goal: Grooming - Progress: Progressing toward  goals ADL Goal: Toilet Transfer - Progress: Progressing toward goals ADL Goal: Toileting - Clothing Manipulation - Progress: Progressing toward goals  Visit Information  Last OT Received On: 10/20/11 Assistance Needed: +1    Subjective Data  Subjective: I would like to brush my teeth Patient Stated Goal: to go to rehab   Prior Functioning       Cognition  Overall Cognitive Status: Appears within functional limits for tasks assessed/performed Arousal/Alertness: Awake/alert Orientation Level: Appears intact for tasks assessed Behavior During Session: [facility] for tasks performed    Mobility Bed Mobility Bed Mobility: Supine to Sit Supine to Sit: 5: Supervision Details for Bed Mobility Assistance: min verbal cues Transfers Transfers: Sit to Stand;Stand to Sit Sit to Stand: 4: Min assist;With upper extremity assist;From bed;From chair/3-in-1 Stand to Sit: 4: Min assist;With upper extremity assist;To chair/3-in-1 Details for Transfer Assistance: continues to need verbal cues to back up completely to surface before sitting and hand placement.   Exercises    Balance Balance Balance Assessed: Yes Dynamic Standing Balance Dynamic Standing - Balance Support: Left upper extremity supported Dynamic Standing - Level of Assistance: 4: Min assist  End of Session OT - End of Session Activity Tolerance: Patient tolerated treatment well Patient left: in chair;with call bell/phone within reach  GO     Lennox Laity [phone] 10/20/2011, 10:31 AM

## 2011-10-20 NOTE — Anesthesia Postprocedure Evaluation (Signed)
  Anesthesia Post-op Note  Patient: Cynthia Frank  Procedure(s) Performed: Procedure(s) (LRB): OPEN REDUCTION INTERNAL FIXATION (ORIF) TIBIA/FIBULA FRACTURE (Left)  Patient Location: PACU  Anesthesia Type: General  Level of Consciousness: awake and alert   Airway and Oxygen Therapy: Patient Spontanous Breathing  Post-op Pain: mild  Post-op Assessment: Post-op Vital signs reviewed, Patient's Cardiovascular Status Stable, Respiratory Function Stable, Patent Airway and No signs of Nausea or vomiting  Post-op Vital Signs: stable  Complications: No apparent anesthesia complications

## 2011-10-20 NOTE — Progress Notes (Signed)
ANTICOAGULATION CONSULT NOTE - Follow Up Consult  Pharmacy Consult for Warfarin Indication: atrial fibrillation, s/p L ORIF  Allergies  Allergen Reactions  . Clindamycin/Lincomycin     itching  . Sulfonamide Derivatives     REACTION: Itching/Swelling   Patient Measurements: Height: 5\' 5"  (165.1 cm) Weight: 150 lb (68.04 kg) IBW/kg (Calculated) : 57   Vital Signs: Temp: 98 F (36.7 C) (07/04 0604) BP: 121/74 mmHg (07/04 0848) Pulse Rate: 66  (07/04 0848)  Labs:  Alvira Philips 10/20/11 0333 10/19/11 0343 10/18/11 0403  HGB -- -- --  HCT -- -- --  PLT -- -- --  APTT -- -- --  LABPROT 15.2 14.7 14.6  INR 1.18 1.13 1.12  HEPARINUNFRC -- -- --  CREATININE -- -- --  CKTOTAL -- -- --  CKMB -- -- --  TROPONINI -- -- --   Estimated Creatinine Clearance: 56.4 ml/min (by C-G formula based on Cr of 0.62).  Coumadin Dosing Inpatient:   7/2: INR 1.12  --> 5 mg  7/3: INR 1.13 --> 6 mg  7/4: INR 1.19 -->10 mg   Assessment:  76 y/o female on Coumadin prior to admission for h/o atrial fibrillation.  Last dose of Coumadin 6/29, pt received Lovenox bridge and L ORIF done 7/2. Of note, admitting INR was 2.44 and patient received Vitamin K 10 mg IV x 1 on 6/29 and 5 mg IV x 1 on 7/1.   Reported PTA Coumadin dose:  2.5 mg on Tues/Sun and 5 mg on other days   Coumadin resumed 7/2 s/p L ORIF with concurrent Lovenox 40 mg sq q24h hours INR >/= 1.8  Drug interaction noted with Cipro and Amiodarone (home med)  INR 1.18, as expected with coumadin re-initiation and s/p vitamin K administration.  CBC wnl on 6/29, no bleeding/complications noted  Plan discharge to SNF 7/5 per ortho, pharmacy will f/u in AM to give Coumadin recommendations.  Goal of Therapy:  INR 2-3  Plan:   Coumadin 10 mg po x 1 tonight to overcome resistant given vitamin K administration  F/u to discontinue Lovenox when INR >/= 1.8  Daily PT/INR, pharmacy will f/u  Geoffry Paradise, PharmD, BCPS Pager: [phone] 9:14  AM

## 2011-10-20 NOTE — Progress Notes (Signed)
Physical Therapy Treatment Patient Details Name: Cynthia Frank MRN: [id number] DOB: 1935/07/30 Today's Date: 10/20/2011 Time: 4098-1191 PT Time Calculation (min): 12 min  PT Assessment / Plan / Recommendation Comments on Treatment Session  Pt able to ambulate 60 feet in hallway.  Pt reports increased UE fatigue with ambulation.  Pt also stated today that she had wrist fx 5 weeks ago (pt is wearing wrist brace today) and reports she is allowed to WB through wrist.  May try w/c mobility with pt next visit for longer distance mobility and to decrease strain on wrist.    Follow Up Recommendations  Skilled nursing facility    Barriers to Discharge        Equipment Recommendations  Defer to next venue    Recommendations for Other Services    Frequency     Plan Discharge plan remains appropriate;Frequency remains appropriate    Precautions / Restrictions Precautions Precautions: Fall Precaution Comments: contact precautions Restrictions Weight Bearing Restrictions: Yes LLE Weight Bearing: Non weight bearing   Pertinent Vitals/Pain 3/10 L LE pain, premedicated, LE elevated    Mobility  Bed Mobility Bed Mobility: Supine to Sit Supine to Sit: 5: Supervision Details for Bed Mobility Assistance: min verbal cues Transfers Transfers: Stand to Sit;Sit to Stand Sit to Stand: With upper extremity assist;From chair/3-in-1;4: Min guard Stand to Sit: With upper extremity assist;To chair/3-in-1;4: Min assist Details for Transfer Assistance: verbal cues for hand placement and to back up completely to surface before trying to sit down, assist to control descent Ambulation/Gait Ambulation/Gait Assistance: 4: Min guard Ambulation Distance (Feet): 60 Feet Assistive device: Rolling walker Ambulation/Gait Assistance Details: multiple short rest breaks due to UE fatigue, NWB with ambulation however pt rests toes on floor for balance when not moving. Gait velocity: decreased    Exercises      PT Diagnosis:    PT Problem List:   PT Treatment Interventions:     PT Goals Acute Rehab PT Goals PT Goal: Sit to Stand - Progress: Progressing toward goal PT Goal: Stand to Sit - Progress: Progressing toward goal PT Goal: Ambulate - Progress: Progressing toward goal  Visit Information  Last PT Received On: 10/20/11 Assistance Needed: +1    Subjective Data  Subjective: "I should be leaving tomorrow."   Cognition  Overall Cognitive Status: Appears within functional limits for tasks assessed/performed Arousal/Alertness: Awake/alert Orientation Level: Appears intact for tasks assessed Behavior During Session: [facility] for tasks performed    Balance  Balance Balance Assessed: Yes Dynamic Standing Balance Dynamic Standing - Balance Support: Left upper extremity supported Dynamic Standing - Level of Assistance: 4: Min assist  End of Session PT - End of Session Equipment Utilized During Treatment: Gait belt Activity Tolerance: Patient limited by fatigue Patient left: in chair;with call bell/phone within reach;with family/visitor present   GP     [name],[name] 10/20/2011, 12:10 PM Pager: [phone]

## 2011-10-20 NOTE — Progress Notes (Signed)
Subjective: Cynthia Frank is awake and alert. Her chief complaint is post-nasal drainage.  Objective: Lab: Lab Results  Component Value Date   INR 1.18 10/20/2011   INR 1.13 10/19/2011   INR 1.12 10/18/2011   Imaging: No new imaging  Physical Exam: Filed Vitals:   10/20/11 0604  BP: 125/91  Pulse: 65  Temp: 98 F (36.7 C)  Resp: 16    Intake/Output Summary (Last 24 hours) at 10/20/11 0738 Last data filed at 10/20/11 1610  Gross per 24 hour  Intake 2573.33 ml  Output   2825 ml  Net -251.67 ml   WNWD white woman HEENT- C&S clear Neck - supple Cor - 2+ radial, RRR Pulm - normal respirations Abd- soft BS+ x 4 Ext - cast on left foot/ankle, toes warm, good capillary refill      Assessment/Plan: 1. Ortho - POD #2. For SNF tomorrow. Will see her within 7 days of d/c from SNF  2. A. Fib - holding sinus rhythm. Coumadin per pharmacy  3. UTI #5/5 septra for UTI  4. Rhinitis - no sign of infection Plan - flonase 1 spray per nostril once daily.  Dispo - for SNf  Will sign off.    Illene Regulus 10/20/2011, 7:38 AM

## 2011-10-20 NOTE — Progress Notes (Signed)
Correction - patient is allergic to sulfonamides and she has been treated with Cipro for her UTI

## 2011-10-20 NOTE — Progress Notes (Signed)
Subjective: 2 Days Post-Op Procedure(s) (LRB): OPEN REDUCTION INTERNAL FIXATION (ORIF) TIBIA/FIBULA FRACTURE (Left) Patient reports pain as mild.   Denies CP or SOB.  Voiding without difficulty. Positive flatus. Objective: Vital signs in last 24 hours: Temp:  [97.9 F (36.6 C)-99.1 F (37.3 C)] 98 F (36.7 C) (07/04 0604) Pulse Rate:  [62-75] 65  (07/04 0604) Resp:  [14-16] 16  (07/04 0604) BP: (91-125)/(61-91) 125/91 mmHg (07/04 0604) SpO2:  [93 %-96 %] 93 % (07/04 0604)  Intake/Output from previous day: 07/03 0701 - 07/04 0700 In: 2573.3 [P.O.:1320; I.V.:1253.3] Out: 2825 [Urine:2825] Intake/Output this shift:    No results found for this basename: HGB:5 in the last 72 hours No results found for this basename: WBC:2,RBC:2,HCT:2,PLT:2 in the last 72 hours No results found for this basename: NA:2,K:2,CL:2,CO2:2,BUN:2,CREATININE:2,GLUCOSE:2,CALCIUM:2 in the last 72 hours  Basename 10/20/11 0333 10/19/11 0343  LABPT -- --  INR 1.18 1.13    Neurologically intact Neurovascular intact Sensation intact distally Compartment soft splint intact  Assessment/Plan: 2 Days Post-Op Procedure(s) (LRB): OPEN REDUCTION INTERNAL FIXATION (ORIF) TIBIA/FIBULA FRACTURE (Left) Advance diet Up with therapy Plan for discharge tomorrow to SNF Cont current care  Deana Krock 10/20/2011, 7:22 AM

## 2011-10-20 NOTE — Progress Notes (Signed)
CSW assisting with d/c planning. Pt has ST SNF bed at [facility] for Fri if stable for d/c. CSW will follow to assist with d/c planning to SNF.  [name] LCSW [phone]

## 2011-10-21 DIAGNOSIS — IMO0001 Reserved for inherently not codable concepts without codable children: Secondary | ICD-10-CM | POA: Diagnosis not present

## 2011-10-21 DIAGNOSIS — I428 Other cardiomyopathies: Secondary | ICD-10-CM | POA: Diagnosis not present

## 2011-10-21 DIAGNOSIS — S82853A Displaced trimalleolar fracture of unspecified lower leg, initial encounter for closed fracture: Secondary | ICD-10-CM | POA: Diagnosis not present

## 2011-10-21 DIAGNOSIS — E785 Hyperlipidemia, unspecified: Secondary | ICD-10-CM | POA: Diagnosis not present

## 2011-10-21 DIAGNOSIS — I4891 Unspecified atrial fibrillation: Secondary | ICD-10-CM | POA: Diagnosis not present

## 2011-10-21 DIAGNOSIS — S82899A Other fracture of unspecified lower leg, initial encounter for closed fracture: Secondary | ICD-10-CM | POA: Diagnosis not present

## 2011-10-21 DIAGNOSIS — Z7901 Long term (current) use of anticoagulants: Secondary | ICD-10-CM | POA: Diagnosis not present

## 2011-10-21 DIAGNOSIS — K5900 Constipation, unspecified: Secondary | ICD-10-CM | POA: Diagnosis not present

## 2011-10-21 DIAGNOSIS — I10 Essential (primary) hypertension: Secondary | ICD-10-CM | POA: Diagnosis not present

## 2011-10-21 DIAGNOSIS — Z9181 History of falling: Secondary | ICD-10-CM | POA: Diagnosis not present

## 2011-10-21 DIAGNOSIS — G4700 Insomnia, unspecified: Secondary | ICD-10-CM | POA: Diagnosis not present

## 2011-10-21 DIAGNOSIS — S8990XA Unspecified injury of unspecified lower leg, initial encounter: Secondary | ICD-10-CM | POA: Diagnosis not present

## 2011-10-21 DIAGNOSIS — Z5189 Encounter for other specified aftercare: Secondary | ICD-10-CM | POA: Diagnosis not present

## 2011-10-21 DIAGNOSIS — N390 Urinary tract infection, site not specified: Secondary | ICD-10-CM | POA: Diagnosis not present

## 2011-10-21 LAB — PROTIME-INR
INR: 1.25 (ref 0.00–1.49)
Prothrombin Time: 16 seconds — ABNORMAL HIGH (ref 11.6–15.2)

## 2011-10-21 MED ORDER — DSS 100 MG PO CAPS: 100.0000 mg | ORAL_CAPSULE | Freq: Two times a day (BID) | ORAL | Status: AC

## 2011-10-21 MED ORDER — WARFARIN SODIUM 7.5 MG PO TABS
7.5000 mg | ORAL_TABLET | Freq: Once | ORAL | Status: DC
  Filled 2011-10-21: qty 1

## 2011-10-21 MED ORDER — SENNA 8.6 MG PO TABS: 1.0000 | ORAL_TABLET | Freq: Two times a day (BID) | ORAL | Status: DC

## 2011-10-21 MED ORDER — HYDROCODONE-ACETAMINOPHEN 10-325 MG PO TABS: 1.0000 | ORAL_TABLET | ORAL | Status: AC | PRN

## 2011-10-21 MED ORDER — ENOXAPARIN SODIUM 40 MG/0.4ML ~~LOC~~ SOLN: 40.0000 mg | SUBCUTANEOUS | Status: DC

## 2011-10-21 MED FILL — Acetaminophen IV Soln 10 MG/ML: INTRAVENOUS | Qty: 100 | Status: AC

## 2011-10-21 NOTE — Progress Notes (Signed)
ST SNF bed available at [facility] today if pt is ready for D/C. FL2 has been signed. SNF will need scripts fro narcotics and D/C Summary. CSW will assist with d/c to SNF when stable.  Asher Muir Besse Miron LCSW [phone]

## 2011-10-21 NOTE — Progress Notes (Signed)
ANTICOAGULATION CONSULT NOTE - Follow Up Consult  Pharmacy Consult for Warfarin Indication: atrial fibrillation, s/p ORIF L ankle  Allergies  Allergen Reactions  . Clindamycin/Lincomycin     itching  . Sulfonamide Derivatives     REACTION: Itching/Swelling   Patient Measurements: Height: 5\' 5"  (165.1 cm) Weight: 150 lb (68.04 kg) IBW/kg (Calculated) : 57   Vital Signs: Temp: 98.5 F (36.9 C) (07/05 0427) Temp src: Oral (07/05 0427) BP: 122/73 mmHg (07/05 0427) Pulse Rate: 64  (07/05 0427)  Labs:  Basename 10/21/11 0421 10/20/11 0333 10/19/11 0343  HGB -- -- --  HCT -- -- --  PLT -- -- --  APTT -- -- --  LABPROT 16.0* 15.2 14.7  INR 1.25 1.18 1.13  HEPARINUNFRC -- -- --  CREATININE -- -- --  CKTOTAL -- -- --  CKMB -- -- --  TROPONINI -- -- --   Estimated Creatinine Clearance: 56.4 ml/min (by C-G formula based on Cr of 0.62).  Coumadin Dosing Inpatient:   7/2: INR 1.12  --> 5 mg  7/3: INR 1.13 --> 6 mg  7/4: INR 1.19 -->10 mg   Assessment:  76 y/o female on Coumadin prior to admission for h/o atrial fibrillation.  Last dose of Coumadin 6/29, pt received Lovenox bridge and ORIF L ankle done 7/2. Of note, admitting INR was 2.44 and patient received Vitamin K 10 mg IV x 1 on 6/29 and 5 mg IV x 1 on 7/1.   Reported PTA Coumadin dose:  2.5 mg on Tues/Sun and 5 mg on other days   Coumadin resumed 7/2 s/p L ORIF with concurrent Lovenox 40 mg sq q24h hours INR >/= 1.8  Drug interaction noted with amiodarone (home med)  INR responding slowly, as expected with warfarin re-initiation in setting of recent Vitamin K administration.   Goal of Therapy:  INR 2-3  Recommend:   Coumadin 7.5mg  po x 1 today to help overcome resistance due to recent Vtamin K administration.  Resume prior warfarin dosage at discharge (5mg  daily except 2.5mg  Tuesdays and Sundays).    Needs close INR F/U until therapeutic and stable.  Suggest checking INR 3 times per week at SNF, with next  INR tomorrow if possible.  Continue Lovenox while INR subtherapeutic.  Elie Goody, PharmD, BCPS Pager: [phone] 10/21/2011  6:34 AM

## 2011-10-21 NOTE — Discharge Summary (Signed)
Physician Discharge Summary  Patient ID: Cynthia Frank MRN: [id number] DOB/AGE: Aug 25, 1935 76 y.o.  Admit date: 10/15/2011 Discharge date: 10/21/2011  Admission Diagnoses:  Atrial fibrillation, h/o colon cancer, htn  Discharge Diagnoses: same Active Problems:  Ankle fracture, bimalleolar, closed   Discharged Condition: stable  Hospital Course: Pt was admitted to the hospital by Dr. Darrelyn Hillock and underwent closed reduction ofher ankle dislocation in the OR.  She was taken back to the OR later for ORIF of her trimal fractrue.  She did well with PT and OT and is discharged to SNF today.  Consults: Dr. Debby Bud  Significant Diagnostic Studies: labs: PT, INR  Treatments: surgery: as above  Discharge Exam: Blood pressure 122/73, pulse 64, temperature 98.5 F (36.9 C), temperature source Oral, resp. rate 14, height 5\' 5"  (1.651 m), weight 68.04 kg (150 lb), SpO2 95.00%. wn wd woman in nad.  A and O x 4.  Mood and affect normal.  L LE splinted.  NVI.  Disposition: to SNF.  Discharge Orders    Future Orders Please Complete By Expires   Diet - low sodium heart healthy      Call MD / Call 911      Comments:   If you experience chest pain or shortness of breath, CALL 911 and be transported to the hospital emergency room.  If you develope a fever above 101 F, pus (white drainage) or increased drainage or redness at the wound, or calf pain, call your surgeon's office.   Constipation Prevention      Comments:   Drink plenty of fluids.  Prune juice may be helpful.  You may use a stool softener, such as Colace (over the counter) 100 mg twice a day.  Use MiraLax (over the counter) for constipation as needed.   Increase activity slowly as tolerated        Medication List  As of 10/21/2011  7:59 AM   TAKE these medications         ALTACE 10 MG capsule   Generic drug: ramipril   Take 5 mg by mouth daily.      amiodarone 200 MG tablet   Commonly known as: PACERONE   Take 200 mg by mouth  daily.      aspirin 81 MG tablet   Take 81 mg by mouth daily.      atorvastatin 10 MG tablet   Commonly known as: LIPITOR   Take 10 mg by mouth daily.      CoQ10 100 MG Caps   Take 1 capsule by mouth daily.      DSS 100 MG Caps   Take 100 mg by mouth 2 (two) times daily.      enoxaparin 40 MG/0.4ML injection   Commonly known as: LOVENOX   Inject 0.4 mLs (40 mg total) into the skin daily.      Fish Oil 1000 MG Caps   Take 2,000 capsules by mouth daily.      HYDROcodone-acetaminophen 10-325 MG per tablet   Commonly known as: NORCO   Take 1-2 tablets by mouth every 4 (four) hours as needed (breakthrough pain).      LASIX 20 MG tablet   Generic drug: furosemide   Take 20 mg by mouth daily.      metoprolol 50 MG tablet   Commonly known as: LOPRESSOR   Take 50 mg by mouth 2 (two) times daily.      MULTIPLE VITAMINS PO   Take by mouth daily.  potassium chloride SA 20 MEQ tablet   Commonly known as: K-DUR,KLOR-CON   Take 40 mEq by mouth daily.      senna 8.6 MG Tabs   Commonly known as: SENOKOT   Take 1 tablet (8.6 mg total) by mouth 2 (two) times daily.      tretinoin 0.1 % cream   Commonly known as: RETIN-A   Apply topically at bedtime.      warfarin 5 MG tablet   Commonly known as: COUMADIN   Take 2.5-5 mg by mouth daily. Pt takes 2.5mg  on tues and sun and pt takes 5mg  on every other day      zolpidem 10 MG tablet   Commonly known as: AMBIEN   Take 0.5 tablets (5 mg total) by mouth at bedtime as needed.           Follow-up Information    Schedule an appointment as soon as possible for a visit with Toni Arthurs, MD.   Contact information:   [address] [place] [address] [phone]         Pt will need to continue lovenox 40 mg qd until her INR is therapeutic.  She will be NWB on her L LE.  She needs to keep it elevated above her heart as much as possible for the next two weeks until she follows up with  me.  SignedToni Arthurs 10/21/2011, 7:59 AM

## 2011-10-21 NOTE — Progress Notes (Signed)
Clinical Social Work Department CLINICAL SOCIAL WORK PLACEMENT NOTE 10/21/2011  Patient:  Cynthia Frank, Cynthia Frank  Account Number:  [id number] Admit date:  10/15/2011  Clinical Social Worker:  [name], LCSW  Date/time:  10/18/2011 07:35 AM  Clinical Social Work is seeking post-discharge placement for this patient at the following level of care:   SKILLED NURSING   (*CSW will update this form in Epic as items are completed)   10/17/2011  Patient/family provided with [facility] Department of Clinical Social Work's list of facilities offering this level of care within the geographic area requested by the patient (or if unable, by the patient's family).  10/17/2011  Patient/family informed of their freedom to choose among providers that offer the needed level of care, that participate in Medicare, Medicaid or managed care program needed by the patient, have an available bed and are willing to accept the patient.    Patient/family informed of [facility]' ownership interest in [facility], as well as of the fact that they are under no obligation to receive care at this facility.  PASARR submitted to EDS on 10/18/2011 PASARR number received from [employer]DS on   FL2 transmitted to all facilities in geographic area requested by pt/family on  10/18/2011 FL2 transmitted to all facilities within larger geographic area on   Patient informed that his/her managed care company has contracts with or will negotiate with  certain facilities, including the following:     Patient/family informed of bed offers received:  10/19/2011 Patient chooses bed at [facility] Physician recommends and patient chooses bed at    Patient to be transferred to [facility] on  10/21/2011 Patient to be transferred to facility by [facility]  The following physician request were entered in Epic:   Additional Comments:  [name] LCSW [phone]

## 2011-10-25 DIAGNOSIS — S82899A Other fracture of unspecified lower leg, initial encounter for closed fracture: Secondary | ICD-10-CM | POA: Diagnosis not present

## 2011-10-25 DIAGNOSIS — Z7901 Long term (current) use of anticoagulants: Secondary | ICD-10-CM | POA: Diagnosis not present

## 2011-10-26 DIAGNOSIS — E785 Hyperlipidemia, unspecified: Secondary | ICD-10-CM | POA: Diagnosis not present

## 2011-10-26 DIAGNOSIS — I4891 Unspecified atrial fibrillation: Secondary | ICD-10-CM | POA: Diagnosis not present

## 2011-10-26 DIAGNOSIS — I10 Essential (primary) hypertension: Secondary | ICD-10-CM | POA: Diagnosis not present

## 2011-10-26 DIAGNOSIS — K5900 Constipation, unspecified: Secondary | ICD-10-CM | POA: Diagnosis not present

## 2011-10-26 DIAGNOSIS — S82899A Other fracture of unspecified lower leg, initial encounter for closed fracture: Secondary | ICD-10-CM | POA: Diagnosis not present

## 2011-10-31 DIAGNOSIS — S82853A Displaced trimalleolar fracture of unspecified lower leg, initial encounter for closed fracture: Secondary | ICD-10-CM | POA: Diagnosis not present

## 2011-11-01 DIAGNOSIS — S82899A Other fracture of unspecified lower leg, initial encounter for closed fracture: Secondary | ICD-10-CM | POA: Diagnosis not present

## 2011-11-01 DIAGNOSIS — Z7901 Long term (current) use of anticoagulants: Secondary | ICD-10-CM | POA: Diagnosis not present

## 2011-11-02 DIAGNOSIS — Z7901 Long term (current) use of anticoagulants: Secondary | ICD-10-CM | POA: Diagnosis not present

## 2011-11-02 DIAGNOSIS — E785 Hyperlipidemia, unspecified: Secondary | ICD-10-CM | POA: Diagnosis not present

## 2011-11-02 DIAGNOSIS — I10 Essential (primary) hypertension: Secondary | ICD-10-CM | POA: Diagnosis not present

## 2011-11-02 DIAGNOSIS — I4891 Unspecified atrial fibrillation: Secondary | ICD-10-CM | POA: Diagnosis not present

## 2011-11-02 DIAGNOSIS — S82899A Other fracture of unspecified lower leg, initial encounter for closed fracture: Secondary | ICD-10-CM | POA: Diagnosis not present

## 2011-11-03 DIAGNOSIS — Z7901 Long term (current) use of anticoagulants: Secondary | ICD-10-CM | POA: Diagnosis not present

## 2011-11-03 DIAGNOSIS — S82899A Other fracture of unspecified lower leg, initial encounter for closed fracture: Secondary | ICD-10-CM | POA: Diagnosis not present

## 2011-11-04 DIAGNOSIS — Z5181 Encounter for therapeutic drug level monitoring: Secondary | ICD-10-CM | POA: Diagnosis not present

## 2011-11-04 DIAGNOSIS — S82853A Displaced trimalleolar fracture of unspecified lower leg, initial encounter for closed fracture: Secondary | ICD-10-CM | POA: Diagnosis not present

## 2011-11-04 DIAGNOSIS — IMO0001 Reserved for inherently not codable concepts without codable children: Secondary | ICD-10-CM | POA: Diagnosis not present

## 2011-11-04 DIAGNOSIS — I10 Essential (primary) hypertension: Secondary | ICD-10-CM | POA: Diagnosis not present

## 2011-11-04 DIAGNOSIS — I429 Cardiomyopathy, unspecified: Secondary | ICD-10-CM | POA: Diagnosis not present

## 2011-11-04 DIAGNOSIS — I4891 Unspecified atrial fibrillation: Secondary | ICD-10-CM | POA: Diagnosis not present

## 2011-11-05 ENCOUNTER — Other Ambulatory Visit: Payer: Self-pay | Admitting: *Deleted

## 2011-11-05 MED ORDER — CIPROFLOXACIN HCL 500 MG PO TABS: 500.0000 mg | ORAL_TABLET | Freq: Two times a day (BID) | ORAL | Status: AC

## 2011-11-05 NOTE — Telephone Encounter (Signed)
Call in Cipro 500 mg bid for 7 days  

## 2011-11-05 NOTE — Telephone Encounter (Signed)
Pt was given Cipro in the hospital when she had ankle surgery.  She was d/c from hospital 7/2.  She is having burning with urination, frequent urination and is unable to come in for a u/a cause she needs to stay off her ankle.  Requesting Dr Clent Ridges call her in something [facility]

## 2011-11-05 NOTE — Telephone Encounter (Signed)
rx sent in electronically, pt aware 

## 2011-11-07 DIAGNOSIS — I10 Essential (primary) hypertension: Secondary | ICD-10-CM | POA: Diagnosis not present

## 2011-11-07 DIAGNOSIS — Z5181 Encounter for therapeutic drug level monitoring: Secondary | ICD-10-CM | POA: Diagnosis not present

## 2011-11-07 DIAGNOSIS — IMO0001 Reserved for inherently not codable concepts without codable children: Secondary | ICD-10-CM | POA: Diagnosis not present

## 2011-11-07 DIAGNOSIS — I4891 Unspecified atrial fibrillation: Secondary | ICD-10-CM | POA: Diagnosis not present

## 2011-11-07 DIAGNOSIS — I429 Cardiomyopathy, unspecified: Secondary | ICD-10-CM | POA: Diagnosis not present

## 2011-11-08 DIAGNOSIS — Z5181 Encounter for therapeutic drug level monitoring: Secondary | ICD-10-CM | POA: Diagnosis not present

## 2011-11-08 DIAGNOSIS — IMO0001 Reserved for inherently not codable concepts without codable children: Secondary | ICD-10-CM | POA: Diagnosis not present

## 2011-11-08 DIAGNOSIS — I429 Cardiomyopathy, unspecified: Secondary | ICD-10-CM | POA: Diagnosis not present

## 2011-11-08 DIAGNOSIS — I4891 Unspecified atrial fibrillation: Secondary | ICD-10-CM | POA: Diagnosis not present

## 2011-11-08 DIAGNOSIS — I10 Essential (primary) hypertension: Secondary | ICD-10-CM | POA: Diagnosis not present

## 2011-11-10 DIAGNOSIS — Z5181 Encounter for therapeutic drug level monitoring: Secondary | ICD-10-CM | POA: Diagnosis not present

## 2011-11-10 DIAGNOSIS — IMO0001 Reserved for inherently not codable concepts without codable children: Secondary | ICD-10-CM | POA: Diagnosis not present

## 2011-11-10 DIAGNOSIS — I4891 Unspecified atrial fibrillation: Secondary | ICD-10-CM | POA: Diagnosis not present

## 2011-11-10 DIAGNOSIS — I429 Cardiomyopathy, unspecified: Secondary | ICD-10-CM | POA: Diagnosis not present

## 2011-11-10 DIAGNOSIS — I10 Essential (primary) hypertension: Secondary | ICD-10-CM | POA: Diagnosis not present

## 2011-11-11 DIAGNOSIS — I429 Cardiomyopathy, unspecified: Secondary | ICD-10-CM | POA: Diagnosis not present

## 2011-11-11 DIAGNOSIS — I10 Essential (primary) hypertension: Secondary | ICD-10-CM | POA: Diagnosis not present

## 2011-11-11 DIAGNOSIS — I4891 Unspecified atrial fibrillation: Secondary | ICD-10-CM | POA: Diagnosis not present

## 2011-11-11 DIAGNOSIS — IMO0001 Reserved for inherently not codable concepts without codable children: Secondary | ICD-10-CM | POA: Diagnosis not present

## 2011-11-11 DIAGNOSIS — Z5181 Encounter for therapeutic drug level monitoring: Secondary | ICD-10-CM | POA: Diagnosis not present

## 2011-11-14 DIAGNOSIS — IMO0001 Reserved for inherently not codable concepts without codable children: Secondary | ICD-10-CM | POA: Diagnosis not present

## 2011-11-14 DIAGNOSIS — I10 Essential (primary) hypertension: Secondary | ICD-10-CM | POA: Diagnosis not present

## 2011-11-14 DIAGNOSIS — I4891 Unspecified atrial fibrillation: Secondary | ICD-10-CM | POA: Diagnosis not present

## 2011-11-14 DIAGNOSIS — Z5181 Encounter for therapeutic drug level monitoring: Secondary | ICD-10-CM | POA: Diagnosis not present

## 2011-11-14 DIAGNOSIS — I429 Cardiomyopathy, unspecified: Secondary | ICD-10-CM | POA: Diagnosis not present

## 2011-11-15 DIAGNOSIS — Z5181 Encounter for therapeutic drug level monitoring: Secondary | ICD-10-CM | POA: Diagnosis not present

## 2011-11-15 DIAGNOSIS — I4891 Unspecified atrial fibrillation: Secondary | ICD-10-CM | POA: Diagnosis not present

## 2011-11-15 DIAGNOSIS — I10 Essential (primary) hypertension: Secondary | ICD-10-CM | POA: Diagnosis not present

## 2011-11-15 DIAGNOSIS — IMO0001 Reserved for inherently not codable concepts without codable children: Secondary | ICD-10-CM | POA: Diagnosis not present

## 2011-11-15 DIAGNOSIS — I429 Cardiomyopathy, unspecified: Secondary | ICD-10-CM | POA: Diagnosis not present

## 2011-11-16 DIAGNOSIS — I4891 Unspecified atrial fibrillation: Secondary | ICD-10-CM | POA: Diagnosis not present

## 2011-11-16 DIAGNOSIS — I10 Essential (primary) hypertension: Secondary | ICD-10-CM | POA: Diagnosis not present

## 2011-11-16 DIAGNOSIS — Z7901 Long term (current) use of anticoagulants: Secondary | ICD-10-CM | POA: Diagnosis not present

## 2011-11-17 DIAGNOSIS — I4891 Unspecified atrial fibrillation: Secondary | ICD-10-CM | POA: Diagnosis not present

## 2011-11-17 DIAGNOSIS — I429 Cardiomyopathy, unspecified: Secondary | ICD-10-CM | POA: Diagnosis not present

## 2011-11-17 DIAGNOSIS — Z5181 Encounter for therapeutic drug level monitoring: Secondary | ICD-10-CM | POA: Diagnosis not present

## 2011-11-17 DIAGNOSIS — I10 Essential (primary) hypertension: Secondary | ICD-10-CM | POA: Diagnosis not present

## 2011-11-17 DIAGNOSIS — IMO0001 Reserved for inherently not codable concepts without codable children: Secondary | ICD-10-CM | POA: Diagnosis not present

## 2011-11-18 DIAGNOSIS — I429 Cardiomyopathy, unspecified: Secondary | ICD-10-CM | POA: Diagnosis not present

## 2011-11-18 DIAGNOSIS — I4891 Unspecified atrial fibrillation: Secondary | ICD-10-CM | POA: Diagnosis not present

## 2011-11-18 DIAGNOSIS — Z5181 Encounter for therapeutic drug level monitoring: Secondary | ICD-10-CM | POA: Diagnosis not present

## 2011-11-18 DIAGNOSIS — I10 Essential (primary) hypertension: Secondary | ICD-10-CM | POA: Diagnosis not present

## 2011-11-18 DIAGNOSIS — IMO0001 Reserved for inherently not codable concepts without codable children: Secondary | ICD-10-CM | POA: Diagnosis not present

## 2011-11-21 DIAGNOSIS — I10 Essential (primary) hypertension: Secondary | ICD-10-CM | POA: Diagnosis not present

## 2011-11-21 DIAGNOSIS — Z5181 Encounter for therapeutic drug level monitoring: Secondary | ICD-10-CM | POA: Diagnosis not present

## 2011-11-21 DIAGNOSIS — I4891 Unspecified atrial fibrillation: Secondary | ICD-10-CM | POA: Diagnosis not present

## 2011-11-21 DIAGNOSIS — IMO0001 Reserved for inherently not codable concepts without codable children: Secondary | ICD-10-CM | POA: Diagnosis not present

## 2011-11-21 DIAGNOSIS — I429 Cardiomyopathy, unspecified: Secondary | ICD-10-CM | POA: Diagnosis not present

## 2011-11-22 DIAGNOSIS — I4891 Unspecified atrial fibrillation: Secondary | ICD-10-CM | POA: Diagnosis not present

## 2011-11-22 DIAGNOSIS — IMO0001 Reserved for inherently not codable concepts without codable children: Secondary | ICD-10-CM | POA: Diagnosis not present

## 2011-11-22 DIAGNOSIS — I10 Essential (primary) hypertension: Secondary | ICD-10-CM | POA: Diagnosis not present

## 2011-11-22 DIAGNOSIS — Z5181 Encounter for therapeutic drug level monitoring: Secondary | ICD-10-CM | POA: Diagnosis not present

## 2011-11-22 DIAGNOSIS — I429 Cardiomyopathy, unspecified: Secondary | ICD-10-CM | POA: Diagnosis not present

## 2011-11-24 DIAGNOSIS — I10 Essential (primary) hypertension: Secondary | ICD-10-CM | POA: Diagnosis not present

## 2011-11-24 DIAGNOSIS — Z5181 Encounter for therapeutic drug level monitoring: Secondary | ICD-10-CM | POA: Diagnosis not present

## 2011-11-24 DIAGNOSIS — IMO0001 Reserved for inherently not codable concepts without codable children: Secondary | ICD-10-CM | POA: Diagnosis not present

## 2011-11-24 DIAGNOSIS — I429 Cardiomyopathy, unspecified: Secondary | ICD-10-CM | POA: Diagnosis not present

## 2011-11-24 DIAGNOSIS — I4891 Unspecified atrial fibrillation: Secondary | ICD-10-CM | POA: Diagnosis not present

## 2011-11-25 ENCOUNTER — Encounter: Payer: Self-pay | Admitting: Internal Medicine

## 2011-11-25 ENCOUNTER — Ambulatory Visit (INDEPENDENT_AMBULATORY_CARE_PROVIDER_SITE_OTHER): Payer: Medicare Other | Admitting: Internal Medicine

## 2011-11-25 ENCOUNTER — Telehealth: Payer: Self-pay | Admitting: Internal Medicine

## 2011-11-25 VITALS — BP 134/98 | HR 98 | Temp 97.4°F | Resp 16 | Wt 140.0 lb

## 2011-11-25 DIAGNOSIS — N390 Urinary tract infection, site not specified: Secondary | ICD-10-CM | POA: Insufficient documentation

## 2011-11-25 LAB — POCT URINALYSIS DIPSTICK
Bilirubin, UA: NEGATIVE
Glucose, UA: NEGATIVE
Ketones, UA: NEGATIVE
Nitrite, UA: NEGATIVE
Protein, UA: NEGATIVE
Spec Grav, UA: <=1.005
Urobilinogen, UA: NEGATIVE
pH, UA: 5

## 2011-11-25 MED ORDER — NITROFURANTOIN MONOHYD MACRO 100 MG PO CAPS: 100.0000 mg | ORAL_CAPSULE | Freq: Two times a day (BID) | ORAL | Status: AC

## 2011-11-25 NOTE — Telephone Encounter (Signed)
° °  Caller: Cynthia Frank/Patient; PCP: Illene Regulus; CB#: [phone]; Call regarding Urinary Pain/Bleeding.  She has frequency that started at 0500 11/25/11.  States she has been on Cipro, but no information in EPIC chart.  No blood seen in urine.  Has some stinging with voiding. Triaged Urinary Symptoms - Female and last voided at 0820. Last Marylee Floras Track Infection that she finished about 11/18/11.  Pt is able to knee walker scooter to get to the bathroom.  All emergent symptoms ruled out and pt needs to be seen in 24 hours as she Has one or more urinary tract symptoms AND has not be previously evaluated (for this particular urinary tract infection.)  Home care and call back instructions given.  Appointment made with Dr. Debby Bud for 11/25/11 at 1615.

## 2011-11-25 NOTE — Patient Instructions (Addendum)
Positive urinalysis - it is a question of relapse or a failure of cipro to totally eradicate a resistant organism.  Plan - macrobid 100 mg twice a day for 7 days.   Come to the lab on the 8th or 9 th day   Over the counter azopyridine as needed for bladder pain.

## 2011-11-27 NOTE — Progress Notes (Signed)
Subjective:    Patient ID: Cynthia Frank, female    DOB: July 10, 1935, 76 y.o.   MRN: [id number]  HPI Cynthia Frank returns for recurrent symptoms of UTI: burning, frequency, urgency. She was recently treated with Cipro. Her symptoms resolved but several days after completing her antibiotic the symptoms returned. She denies fevers, chills, back pain. She admits to hematuria.  Past Medical History  Diagnosis Date  . Hx: UTI (urinary tract infection)   . Nonischemic cardiomyopathy     chemo related  . History of ovarian cancer 1985  . Hypertension   . Partial bowel obstruction   . Internal hemorrhoid   . Colon cancer 03/2006    T3, N0  . Atrial fibrillation    Past Surgical History  Procedure Date  . Abdominal hysterectomy   . Oophorectomy     '61  . Tonsillectomy     '44  . Resection of right ovarian cancer '84, repeat '86   . Laparotomy     '80 adhesions  . Resection due to partial bowel obstruction     1985  . Colon surgery     for colon cancer in 2007  . Laparotomy for takedown of intestinal obstruction secondary to adhesions     1989  . Ankle closed reduction 10/16/2011    Procedure: CLOSED REDUCTION ANKLE;  Surgeon: Jacki Cones, MD;  Location: [facility];  Service: Orthopedics;  Laterality: Left;   Family History  Problem Relation Age of Onset  . Uterine cancer Mother   . Prostate cancer Father   . Heart disease Father   . Colon cancer Neg Hx    History   Social History  . Marital Status: Married    Spouse Name: N/A    Number of Children: 2  . Years of Education: N/A   Occupational History  . retired    Social History Main Topics  . Smoking status: Former Smoker -- 0.5 packs/day for 20 years    Types: Cigarettes    Quit date: 04/18/1978  . Smokeless tobacco: Not on file  . Alcohol Use: 7.0 oz/week    14 drink(s) per week  . Drug Use: No  . Sexually Active: Not on file   Other Topics Concern  . Not on file   Social History Narrative   Patient  had never smoked.Alcohol use- yesDaily caffeine use- coffeeIllicit drug use- noOccupation:retired     Current Outpatient Prescriptions on File Prior to Visit  Medication Sig Dispense Refill  . amiodarone (PACERONE) 200 MG tablet Take 200 mg by mouth daily.       Marland Kitchen aspirin 81 MG tablet Take 81 mg by mouth daily.       Marland Kitchen atorvastatin (LIPITOR) 10 MG tablet Take 10 mg by mouth daily.      . Coenzyme Q10 (COQ10) 100 MG CAPS Take 1 capsule by mouth daily.       . MULTIPLE VITAMINS PO Take by mouth daily.        . Omega-3 Fatty Acids (FISH OIL) 1000 MG CAPS Take 2,000 capsules by mouth daily.       . ramipril (ALTACE) 10 MG capsule Take 5 mg by mouth daily.       Marland Kitchen tretinoin (RETIN-A) 0.1 % cream Apply topically at bedtime.        Marland Kitchen warfarin (COUMADIN) 5 MG tablet Take 2.5-5 mg by mouth daily. Pt takes 2.5mg  on tues and sun and pt takes 5mg  on every other day      .  zolpidem (AMBIEN) 10 MG tablet Take 0.5 tablets (5 mg total) by mouth at bedtime as needed.  30 tablet  0  . enoxaparin (LOVENOX) 40 MG/0.4ML injection Inject 0.4 mLs (40 mg total) into the skin daily.  5 Syringe  1  . furosemide (LASIX) 20 MG tablet Take 20 mg by mouth daily.       . metoprolol (LOPRESSOR) 50 MG tablet Take 50 mg by mouth 2 (two) times daily.      . potassium chloride SA (K-DUR,KLOR-CON) 20 MEQ tablet Take 40 mEq by mouth daily.       Marland Kitchen senna (SENOKOT) 8.6 MG TABS Take 1 tablet (8.6 mg total) by mouth 2 (two) times daily.  60 each  0      Review of Systems System review is negative for any constitutional, cardiac, pulmonary, GI or neuro symptoms or complaints other than as described in the HPI.     Objective:   Physical Exam Filed Vitals:   11/25/11 1706  BP: 134/98  Pulse: 98  Temp: 97.4 F (36.3 C)  Resp: 16   Gen'l - WNWD white woman in no distress Abd- no suprapubic tenderness to palpation, no flank tenderness to percussion Cor- RRR Pulm - normal respirations.  U/A dip - positive for LE       Assessment & Plan:  UTI - recurrent symptoms suggesting possible antibiotic failure, i.e. Resistant organism as source of infection  Plan Macrobid bid x 7 days  U/A w/ reflex to micro for test of cure 2-3 days after completing antibiotics.

## 2011-11-28 DIAGNOSIS — S82853A Displaced trimalleolar fracture of unspecified lower leg, initial encounter for closed fracture: Secondary | ICD-10-CM | POA: Diagnosis not present

## 2011-11-29 DIAGNOSIS — I4891 Unspecified atrial fibrillation: Secondary | ICD-10-CM | POA: Diagnosis not present

## 2011-11-29 DIAGNOSIS — Z5181 Encounter for therapeutic drug level monitoring: Secondary | ICD-10-CM | POA: Diagnosis not present

## 2011-11-29 DIAGNOSIS — I10 Essential (primary) hypertension: Secondary | ICD-10-CM | POA: Diagnosis not present

## 2011-11-29 DIAGNOSIS — I429 Cardiomyopathy, unspecified: Secondary | ICD-10-CM | POA: Diagnosis not present

## 2011-11-29 DIAGNOSIS — IMO0001 Reserved for inherently not codable concepts without codable children: Secondary | ICD-10-CM | POA: Diagnosis not present

## 2011-12-01 DIAGNOSIS — I4891 Unspecified atrial fibrillation: Secondary | ICD-10-CM | POA: Diagnosis not present

## 2011-12-01 DIAGNOSIS — I10 Essential (primary) hypertension: Secondary | ICD-10-CM | POA: Diagnosis not present

## 2011-12-01 DIAGNOSIS — I429 Cardiomyopathy, unspecified: Secondary | ICD-10-CM | POA: Diagnosis not present

## 2011-12-01 DIAGNOSIS — IMO0001 Reserved for inherently not codable concepts without codable children: Secondary | ICD-10-CM | POA: Diagnosis not present

## 2011-12-01 DIAGNOSIS — Z5181 Encounter for therapeutic drug level monitoring: Secondary | ICD-10-CM | POA: Diagnosis not present

## 2011-12-02 DIAGNOSIS — I10 Essential (primary) hypertension: Secondary | ICD-10-CM | POA: Diagnosis not present

## 2011-12-02 DIAGNOSIS — I4891 Unspecified atrial fibrillation: Secondary | ICD-10-CM | POA: Diagnosis not present

## 2011-12-02 DIAGNOSIS — Z5181 Encounter for therapeutic drug level monitoring: Secondary | ICD-10-CM | POA: Diagnosis not present

## 2011-12-02 DIAGNOSIS — I429 Cardiomyopathy, unspecified: Secondary | ICD-10-CM | POA: Diagnosis not present

## 2011-12-02 DIAGNOSIS — IMO0001 Reserved for inherently not codable concepts without codable children: Secondary | ICD-10-CM | POA: Diagnosis not present

## 2011-12-05 ENCOUNTER — Other Ambulatory Visit (INDEPENDENT_AMBULATORY_CARE_PROVIDER_SITE_OTHER): Payer: Medicare Other

## 2011-12-05 DIAGNOSIS — N390 Urinary tract infection, site not specified: Secondary | ICD-10-CM | POA: Diagnosis not present

## 2011-12-05 LAB — URINALYSIS, ROUTINE W REFLEX MICROSCOPIC
Bilirubin Urine: NEGATIVE
Ketones, ur: NEGATIVE
Nitrite: NEGATIVE
Specific Gravity, Urine: <=1.005 (ref 1.000–1.030)
Total Protein, Urine: NEGATIVE
Urine Glucose: NEGATIVE
Urobilinogen, UA: 0.2 (ref 0.0–1.0)
pH: 7.5 (ref 5.0–8.0)

## 2011-12-06 DIAGNOSIS — IMO0001 Reserved for inherently not codable concepts without codable children: Secondary | ICD-10-CM | POA: Diagnosis not present

## 2011-12-06 DIAGNOSIS — I429 Cardiomyopathy, unspecified: Secondary | ICD-10-CM | POA: Diagnosis not present

## 2011-12-06 DIAGNOSIS — Z5181 Encounter for therapeutic drug level monitoring: Secondary | ICD-10-CM | POA: Diagnosis not present

## 2011-12-06 DIAGNOSIS — I10 Essential (primary) hypertension: Secondary | ICD-10-CM | POA: Diagnosis not present

## 2011-12-06 DIAGNOSIS — I4891 Unspecified atrial fibrillation: Secondary | ICD-10-CM | POA: Diagnosis not present

## 2011-12-08 DIAGNOSIS — I4891 Unspecified atrial fibrillation: Secondary | ICD-10-CM | POA: Diagnosis not present

## 2011-12-08 DIAGNOSIS — I10 Essential (primary) hypertension: Secondary | ICD-10-CM | POA: Diagnosis not present

## 2011-12-08 DIAGNOSIS — IMO0001 Reserved for inherently not codable concepts without codable children: Secondary | ICD-10-CM | POA: Diagnosis not present

## 2011-12-08 DIAGNOSIS — Z5181 Encounter for therapeutic drug level monitoring: Secondary | ICD-10-CM | POA: Diagnosis not present

## 2011-12-08 DIAGNOSIS — I429 Cardiomyopathy, unspecified: Secondary | ICD-10-CM | POA: Diagnosis not present

## 2011-12-13 ENCOUNTER — Telehealth: Payer: Self-pay | Admitting: *Deleted

## 2011-12-13 DIAGNOSIS — I429 Cardiomyopathy, unspecified: Secondary | ICD-10-CM | POA: Diagnosis not present

## 2011-12-13 DIAGNOSIS — Z5181 Encounter for therapeutic drug level monitoring: Secondary | ICD-10-CM | POA: Diagnosis not present

## 2011-12-13 DIAGNOSIS — IMO0001 Reserved for inherently not codable concepts without codable children: Secondary | ICD-10-CM | POA: Diagnosis not present

## 2011-12-13 DIAGNOSIS — I4891 Unspecified atrial fibrillation: Secondary | ICD-10-CM | POA: Diagnosis not present

## 2011-12-13 DIAGNOSIS — I10 Essential (primary) hypertension: Secondary | ICD-10-CM | POA: Diagnosis not present

## 2011-12-13 NOTE — Telephone Encounter (Signed)
Message copied by Elnora Morrison on Tue Dec 13, 2011 11:45 AM ------      Message from: Jacques Navy      Created: Sun Dec 11, 2011  6:22 PM       Call patient - follow u/a was essentially negative. Any symptoms?

## 2011-12-13 NOTE — Telephone Encounter (Signed)
PATIENT AWARE OF U/A REPORT NEGATIVE, STATES HAVING NO MORE SYMPTOMS

## 2011-12-15 DIAGNOSIS — I429 Cardiomyopathy, unspecified: Secondary | ICD-10-CM | POA: Diagnosis not present

## 2011-12-15 DIAGNOSIS — IMO0001 Reserved for inherently not codable concepts without codable children: Secondary | ICD-10-CM | POA: Diagnosis not present

## 2011-12-15 DIAGNOSIS — I4891 Unspecified atrial fibrillation: Secondary | ICD-10-CM | POA: Diagnosis not present

## 2011-12-15 DIAGNOSIS — Z5181 Encounter for therapeutic drug level monitoring: Secondary | ICD-10-CM | POA: Diagnosis not present

## 2011-12-15 DIAGNOSIS — I10 Essential (primary) hypertension: Secondary | ICD-10-CM | POA: Diagnosis not present

## 2011-12-21 DIAGNOSIS — E7800 Pure hypercholesterolemia, unspecified: Secondary | ICD-10-CM | POA: Diagnosis not present

## 2011-12-22 DIAGNOSIS — I4891 Unspecified atrial fibrillation: Secondary | ICD-10-CM | POA: Diagnosis not present

## 2011-12-22 DIAGNOSIS — Z5181 Encounter for therapeutic drug level monitoring: Secondary | ICD-10-CM | POA: Diagnosis not present

## 2011-12-22 DIAGNOSIS — IMO0001 Reserved for inherently not codable concepts without codable children: Secondary | ICD-10-CM | POA: Diagnosis not present

## 2011-12-22 DIAGNOSIS — I429 Cardiomyopathy, unspecified: Secondary | ICD-10-CM | POA: Diagnosis not present

## 2011-12-22 DIAGNOSIS — I10 Essential (primary) hypertension: Secondary | ICD-10-CM | POA: Diagnosis not present

## 2012-01-02 DIAGNOSIS — S82853A Displaced trimalleolar fracture of unspecified lower leg, initial encounter for closed fracture: Secondary | ICD-10-CM | POA: Diagnosis not present

## 2012-01-04 ENCOUNTER — Telehealth: Payer: Self-pay | Admitting: Internal Medicine

## 2012-01-04 DIAGNOSIS — Z7901 Long term (current) use of anticoagulants: Secondary | ICD-10-CM | POA: Diagnosis not present

## 2012-01-04 DIAGNOSIS — I4891 Unspecified atrial fibrillation: Secondary | ICD-10-CM | POA: Diagnosis not present

## 2012-01-04 NOTE — Telephone Encounter (Signed)
Caller: Disney/Patient; Phone: [phone]; Reason for Call: Patient was seen in the hospital back in July, states was given Fluticasone Propionate.  Calling to see if Dr.  Debby Bud could [facility] [address] of [address].  Please call her back.  Thanks

## 2012-01-05 NOTE — Telephone Encounter (Signed)
Ok for fluticasone spray: 1 spray per nares daily

## 2012-01-06 ENCOUNTER — Other Ambulatory Visit: Payer: Self-pay | Admitting: *Deleted

## 2012-01-06 MED ORDER — FLUTICASONE PROPIONATE 50 MCG/ACT NA SUSP: 1.0000 | Freq: Every day | NASAL | Status: DC

## 2012-01-11 DIAGNOSIS — Z23 Encounter for immunization: Secondary | ICD-10-CM | POA: Diagnosis not present

## 2012-01-18 DIAGNOSIS — Z7901 Long term (current) use of anticoagulants: Secondary | ICD-10-CM | POA: Diagnosis not present

## 2012-01-18 DIAGNOSIS — I4891 Unspecified atrial fibrillation: Secondary | ICD-10-CM | POA: Diagnosis not present

## 2012-02-13 DIAGNOSIS — I4891 Unspecified atrial fibrillation: Secondary | ICD-10-CM | POA: Diagnosis not present

## 2012-02-13 DIAGNOSIS — Z7901 Long term (current) use of anticoagulants: Secondary | ICD-10-CM | POA: Diagnosis not present

## 2012-02-15 ENCOUNTER — Other Ambulatory Visit: Payer: Medicare Other

## 2012-02-15 ENCOUNTER — Encounter: Payer: Self-pay | Admitting: Internal Medicine

## 2012-02-15 ENCOUNTER — Telehealth: Payer: Self-pay | Admitting: Internal Medicine

## 2012-02-15 ENCOUNTER — Ambulatory Visit (INDEPENDENT_AMBULATORY_CARE_PROVIDER_SITE_OTHER): Payer: Medicare Other | Admitting: Internal Medicine

## 2012-02-15 VITALS — BP 132/84 | HR 80 | Temp 96.9°F | Ht 65.0 in | Wt 140.0 lb

## 2012-02-15 DIAGNOSIS — R3129 Other microscopic hematuria: Secondary | ICD-10-CM

## 2012-02-15 DIAGNOSIS — N3090 Cystitis, unspecified without hematuria: Secondary | ICD-10-CM | POA: Diagnosis not present

## 2012-02-15 DIAGNOSIS — R300 Dysuria: Secondary | ICD-10-CM

## 2012-02-15 DIAGNOSIS — R350 Frequency of micturition: Secondary | ICD-10-CM | POA: Diagnosis not present

## 2012-02-15 LAB — POCT URINALYSIS DIPSTICK
Bilirubin, UA: NEGATIVE
Blood, UA: POSITIVE
Glucose, UA: NEGATIVE
Ketones, UA: NEGATIVE
Leukocytes, UA: NEGATIVE
Nitrite, UA: NEGATIVE
Protein, UA: NEGATIVE
Spec Grav, UA: 1.01
Urobilinogen, UA: 0.2
pH, UA: 6.5

## 2012-02-15 MED ORDER — PHENAZOPYRIDINE HCL 200 MG PO TABS: 200.0000 mg | ORAL_TABLET | Freq: Three times a day (TID) | ORAL | Status: DC | PRN

## 2012-02-15 MED ORDER — CIPROFLOXACIN HCL 500 MG PO TABS: 500.0000 mg | ORAL_TABLET | Freq: Two times a day (BID) | ORAL | Status: AC

## 2012-02-15 NOTE — Progress Notes (Signed)
Subjective:    Patient ID: Cynthia Frank, female    DOB: July 25, 1935, 76 y.o.   MRN: [id number]  HPI  Pt presents to clinic today complaining of a burning sensation during and after voiding. Pt also c/o of urinary frequency. [place] the beach and had urinary symptoms, went to urgent care where they diagnosed her with a UTI and gave her a 10 day course of ampicillin. Pt has completed the course of antibiotics but still having pain and frequency. Pt has history of 4 UTI's since 10/2011. Pt has never seen a urologist.  Review of Systems      Past Medical History  Diagnosis Date  . Hx: UTI (urinary tract infection)   . Nonischemic cardiomyopathy     chemo related  . History of ovarian cancer 1985  . Hypertension   . Partial bowel obstruction   . Internal hemorrhoid   . Colon cancer 03/2006    T3, N0  . Atrial fibrillation     Current Outpatient Prescriptions  Medication Sig Dispense Refill  . amiodarone (PACERONE) 200 MG tablet Take 200 mg by mouth daily.       Marland Kitchen aspirin 81 MG tablet Take 81 mg by mouth daily.       Marland Kitchen atorvastatin (LIPITOR) 10 MG tablet Take 10 mg by mouth daily.      . Coenzyme Q10 (COQ10) 100 MG CAPS Take 1 capsule by mouth daily.       . fluticasone (FLONASE) 50 MCG/ACT nasal spray Place 1 spray into the nose daily.  16 g  3  . MULTIPLE VITAMINS PO Take by mouth daily.        . Omega-3 Fatty Acids (FISH OIL) 1000 MG CAPS Take 2,000 capsules by mouth daily.       . ramipril (ALTACE) 10 MG capsule Take 5 mg by mouth daily.       Marland Kitchen tretinoin (RETIN-A) 0.1 % cream Apply topically at bedtime.        Marland Kitchen warfarin (COUMADIN) 5 MG tablet Take 2.5-5 mg by mouth daily. Pt takes 2.5mg  on tues and sun and pt takes 5mg  on every other day      . zolpidem (AMBIEN) 10 MG tablet Take 0.5 tablets (5 mg total) by mouth at bedtime as needed.  30 tablet  0  . ciprofloxacin (CIPRO) 500 MG tablet Take 1 tablet (500 mg total) by mouth 2 (two) times daily.  14 tablet  0  .  furosemide (LASIX) 20 MG tablet Take 20 mg by mouth daily.       . metoprolol (LOPRESSOR) 50 MG tablet Take 50 mg by mouth 2 (two) times daily.      . phenazopyridine (PYRIDIUM) 200 MG tablet Take 1 tablet (200 mg total) by mouth 3 (three) times daily as needed for pain.  12 tablet  0  . potassium chloride SA (K-DUR,KLOR-CON) 20 MEQ tablet Take 40 mEq by mouth daily.         Allergies  Allergen Reactions  . Clindamycin/Lincomycin     itching  . Sulfonamide Derivatives     REACTION: Itching/Swelling    Family History  Problem Relation Age of Onset  . Uterine cancer Mother   . Prostate cancer Father   . Heart disease Father   . Colon cancer Neg Hx     History   Social History  . Marital Status: Married    Spouse Name: N/A    Number of Children: 2  . Years  of Education: N/A   Occupational History  . retired    Social History Main Topics  . Smoking status: Former Smoker -- 0.5 packs/day for 20 years    Types: Cigarettes    Quit date: 04/18/1978  . Smokeless tobacco: Not on file  . Alcohol Use: 7.0 oz/week    14 drink(s) per week  . Drug Use: No  . Sexually Active: Not on file   Other Topics Concern  . Not on file   Social History Narrative   Patient had never smoked.Alcohol use- yesDaily caffeine use- coffeeIllicit drug use- noOccupation:retired      Constitutional: Denies fever, malaise, fatigue, headache or abrupt weight changes.  Respiratory: Denies difficulty breathing, shortness of breath, cough or sputum production.   Cardiovascular: Denies chest pain, chest tightness, palpitations or swelling in the hands or feet.  Gastrointestinal: Denies nausea, vomiting, abdominal pain, bloating, constipation, diarrhea or blood in the stool. GU: Pertinent information in HPI. Denies urinary odor, vaginal pain or discharge.  Skin: Denies redness, rashes, lesions or ulcercations.     No other specific complaints in a complete review of systems (except as listed in HPI  above).   Objective:   Physical Exam  BP 132/84  Pulse 80  Temp 96.9 F (36.1 C) (Oral)  Ht 5\' 5"  (1.651 m)  Wt 140 lb (63.504 kg)  BMI 23.30 kg/m2  SpO2 99% Wt Readings from Last 3 Encounters:  02/15/12 140 lb (63.504 kg)  11/25/11 140 lb (63.504 kg)  10/15/11 150 lb (68.04 kg)    General: Appears her stated age, well developed, well nourished in NAD. Cardiovascular: Normal rate and rhythm. S1,S2 noted.  No murmur, rubs or gallops noted. No JVD or BLE edema. No carotid bruits noted. Pulmonary/Chest: Normal effort and positive vesicular breath sounds. No respiratory distress. No wheezes, rales or ronchi noted.  Abdomen: Soft and nontender. Normal bowel sounds, no bruits noted. No distention or masses noted. Liver, spleen and kidneys non palpable. No CVA tenderness noted. Psychiatric: Mood and affect normal. Behavior is normal. Judgment and thought content normal.    Assessment & Plan:   Cystitis Dysuria Urinary frequency  Drink plenty of fluids. eRx sent for Pyridium 200mg  TID for dysuria. Please be advised that your urine may turn orange, but this is a normal side effect of the medciation. eRx sent in for Cipro 500mg  BID for 7 days.  Referred to urology for frequent UTI's and recurrent cystitis.

## 2012-02-15 NOTE — Addendum Note (Signed)
Addended by: Lorre Munroe on: 02/15/2012 11:57 AM   Modules accepted: Orders

## 2012-02-15 NOTE — Patient Instructions (Signed)
Urinary Tract Infection  A urinary tract infection (UTI) is often caused by a germ (bacteria). A UTI is usually helped with medicine (antibiotics) that kills germs. Take all the medicine until it is gone. Do this even if you are feeling better. You are usually better in 7 to 10 days.  HOME CARE    Drink enough water and fluids to keep your pee (urine) clear or pale yellow. Drink:   Cranberry juice.   Water.   Avoid:   Caffeine.   Tea.   Bubbly (carbonated) drinks.   Alcohol.   Only take medicine as told by your doctor.   To prevent further infections:   Pee often.   After pooping (bowel movement), women should wipe from front to back. Use each tissue only once.   Pee before and after having sex (intercourse).  Ask your doctor when your test results will be ready. Make sure you follow up and get your test results.   GET HELP RIGHT AWAY IF:    There is very bad back pain or lower belly (abdominal) pain.   You get the chills.   You have a fever.   Your baby is older than 3 months with a rectal temperature of 102 F (38.9 C) or higher.   Your baby is 3 months old or younger with a rectal temperature of 100.4 F (38 C) or higher.   You feel sick to your stomach (nauseous) or throw up (vomit).   There is continued burning with peeing.   Your problems are not better in 3 days. Return sooner if you are getting worse.  MAKE SURE YOU:    Understand these instructions.   Will watch your condition.   Will get help right away if you are not doing well or get worse.  Document Released: 09/21/2007 Document Revised: 06/27/2011 Document Reviewed: 09/21/2007  [employer] Patient Information 2013 [employer].

## 2012-02-15 NOTE — Telephone Encounter (Signed)
Caller: Shaia/Patient; Patient Name: Cynthia Frank; PCP: Illene Regulus (Adults only); Best Callback Phone Number: [phone].  Patient calling about symptoms of UTI.  States has had 4 episodes of UTI since July 2013.  Has been taking macrobid and cipro for these.  ONset 02/02/12 of symptoms of urgency, frequency, and blood in urine.  [place] the beach at that [place], seen in UC, and finished Amoxicillin 02/12/12.  ONset of new urinary spasms/pain 02/14/12 PM.  States having burning sensation.  Afebrile.  Per urinary symptoms protocol, emergent symptoms denied; appt scheduled 02/15/12 1115 with Ms. Sampson Si.

## 2012-02-18 LAB — URINE CULTURE: Colony Count: >=100000

## 2012-03-01 DIAGNOSIS — R300 Dysuria: Secondary | ICD-10-CM | POA: Diagnosis not present

## 2012-03-01 DIAGNOSIS — N3020 Other chronic cystitis without hematuria: Secondary | ICD-10-CM | POA: Diagnosis not present

## 2012-03-12 DIAGNOSIS — Z7901 Long term (current) use of anticoagulants: Secondary | ICD-10-CM | POA: Diagnosis not present

## 2012-03-12 DIAGNOSIS — I4891 Unspecified atrial fibrillation: Secondary | ICD-10-CM | POA: Diagnosis not present

## 2012-03-26 DIAGNOSIS — I4891 Unspecified atrial fibrillation: Secondary | ICD-10-CM | POA: Diagnosis not present

## 2012-03-26 DIAGNOSIS — Z7901 Long term (current) use of anticoagulants: Secondary | ICD-10-CM | POA: Diagnosis not present

## 2012-03-27 DIAGNOSIS — I10 Essential (primary) hypertension: Secondary | ICD-10-CM | POA: Diagnosis not present

## 2012-03-27 DIAGNOSIS — I4891 Unspecified atrial fibrillation: Secondary | ICD-10-CM | POA: Diagnosis not present

## 2012-04-02 DIAGNOSIS — I10 Essential (primary) hypertension: Secondary | ICD-10-CM | POA: Diagnosis not present

## 2012-04-02 DIAGNOSIS — I4891 Unspecified atrial fibrillation: Secondary | ICD-10-CM | POA: Diagnosis not present

## 2012-04-05 DIAGNOSIS — I4891 Unspecified atrial fibrillation: Secondary | ICD-10-CM | POA: Diagnosis not present

## 2012-04-09 DIAGNOSIS — Z79899 Other long term (current) drug therapy: Secondary | ICD-10-CM | POA: Diagnosis not present

## 2012-04-13 DIAGNOSIS — Z7901 Long term (current) use of anticoagulants: Secondary | ICD-10-CM | POA: Diagnosis not present

## 2012-04-13 DIAGNOSIS — I10 Essential (primary) hypertension: Secondary | ICD-10-CM | POA: Diagnosis not present

## 2012-04-13 DIAGNOSIS — I4891 Unspecified atrial fibrillation: Secondary | ICD-10-CM | POA: Diagnosis not present

## 2012-04-13 DIAGNOSIS — I428 Other cardiomyopathies: Secondary | ICD-10-CM | POA: Diagnosis not present

## 2012-04-13 DIAGNOSIS — Z79899 Other long term (current) drug therapy: Secondary | ICD-10-CM | POA: Diagnosis not present

## 2012-04-13 DIAGNOSIS — R0602 Shortness of breath: Secondary | ICD-10-CM | POA: Diagnosis not present

## 2012-04-26 DIAGNOSIS — I4891 Unspecified atrial fibrillation: Secondary | ICD-10-CM | POA: Diagnosis not present

## 2012-04-26 DIAGNOSIS — Z7901 Long term (current) use of anticoagulants: Secondary | ICD-10-CM | POA: Diagnosis not present

## 2012-04-26 DIAGNOSIS — I4949 Other premature depolarization: Secondary | ICD-10-CM | POA: Diagnosis not present

## 2012-04-26 DIAGNOSIS — Z79899 Other long term (current) drug therapy: Secondary | ICD-10-CM | POA: Diagnosis not present

## 2012-04-26 DIAGNOSIS — I10 Essential (primary) hypertension: Secondary | ICD-10-CM | POA: Diagnosis not present

## 2012-04-26 DIAGNOSIS — R0602 Shortness of breath: Secondary | ICD-10-CM | POA: Diagnosis not present

## 2012-04-26 DIAGNOSIS — I428 Other cardiomyopathies: Secondary | ICD-10-CM | POA: Diagnosis not present

## 2012-05-04 DIAGNOSIS — N3020 Other chronic cystitis without hematuria: Secondary | ICD-10-CM | POA: Diagnosis not present

## 2012-05-04 DIAGNOSIS — R300 Dysuria: Secondary | ICD-10-CM | POA: Diagnosis not present

## 2012-05-04 DIAGNOSIS — N952 Postmenopausal atrophic vaginitis: Secondary | ICD-10-CM | POA: Diagnosis not present

## 2012-05-16 ENCOUNTER — Ambulatory Visit (INDEPENDENT_AMBULATORY_CARE_PROVIDER_SITE_OTHER): Payer: Medicare Other | Admitting: Internal Medicine

## 2012-05-16 ENCOUNTER — Ambulatory Visit (INDEPENDENT_AMBULATORY_CARE_PROVIDER_SITE_OTHER)
Admission: RE | Admit: 2012-05-16 | Discharge: 2012-05-16 | Disposition: A | Discharge status: Home / Self Care | Payer: Medicare Other | Source: Ambulatory Visit | Attending: Internal Medicine | Admitting: Internal Medicine

## 2012-05-16 ENCOUNTER — Encounter: Payer: Self-pay | Admitting: Internal Medicine

## 2012-05-16 VITALS — BP 118/74 | HR 76 | Temp 98.2°F | Ht 65.0 in | Wt 145.0 lb

## 2012-05-16 DIAGNOSIS — I10 Essential (primary) hypertension: Secondary | ICD-10-CM

## 2012-05-16 DIAGNOSIS — R059 Cough, unspecified: Secondary | ICD-10-CM

## 2012-05-16 DIAGNOSIS — J9819 Other pulmonary collapse: Secondary | ICD-10-CM | POA: Diagnosis not present

## 2012-05-16 DIAGNOSIS — R05 Cough: Secondary | ICD-10-CM

## 2012-05-16 DIAGNOSIS — J90 Pleural effusion, not elsewhere classified: Secondary | ICD-10-CM | POA: Diagnosis not present

## 2012-05-16 MED ORDER — OLMESARTAN MEDOXOMIL 20 MG PO TABS: ORAL_TABLET | ORAL | Status: DC

## 2012-05-16 MED ORDER — OLMESARTAN MEDOXOMIL 20 MG PO TABS: 20.0000 mg | ORAL_TABLET | Freq: Every day | ORAL | Status: DC

## 2012-05-16 MED ORDER — PANTOPRAZOLE SODIUM 40 MG PO TBEC: 40.0000 mg | DELAYED_RELEASE_TABLET | Freq: Every day | ORAL | Status: DC

## 2012-05-16 NOTE — Progress Notes (Signed)
  Subjective:    Patient ID: Cynthia Frank, female    DOB: 08/21/1935   MRN: [id number]  HPI  77 yowf  Quit smoking age 77 referred 05/16/2012 to pulmonary clinic by Dr Debby Bud for sob.   05/16/2012 1st pulmonary eval on Altace/ amiodarone  cc 2.5 month noct sob/choking in setting of pnds x 5-6 years and doe x steps both symptoms highly variable but sob and  Cough gone p ambien at hs. Typically in am produces < Tbsp white mucus and already rx'd with antihistamines decongestants and ppi s improvement.  Feels sense of fullness in throat when swallows  No obvious pattern daytime variabilty or assoc  cp or chest tightness, subjective wheeze overt  hb symptoms. No unusual exp hx or h/o childhood pna/ asthma or premature birth to her knowledge.   After [employer] ok without nocturnal  or early am exacerbation  of respiratory  c/o's or need for noct saba. Also denies any obvious fluctuation of symptoms with weather or environmental changes or other aggravating or alleviating factors except as outlined above    Review of Systems  Constitutional: Negative for fever and unexpected weight change.  HENT: Positive for congestion and sneezing. Negative for ear pain, nosebleeds, sore throat, rhinorrhea, trouble swallowing, dental problem, postnasal drip and sinus pressure.   Eyes: Negative for redness and itching.  Respiratory: Positive for cough and shortness of breath. Negative for chest tightness and wheezing.   Cardiovascular: Positive for palpitations. Negative for leg swelling.  Gastrointestinal: Negative for nausea and vomiting.  Genitourinary: Negative for dysuria.  Musculoskeletal: Negative for joint swelling.  Skin: Negative for rash.  Neurological: Negative for headaches.  Hematological: Does not bruise/bleed easily.  Psychiatric/Behavioral: Negative for dysphoric mood. The patient is not nervous/anxious.        Objective:   Physical Exam Wt Readings from Last 3 Encounters:    05/16/12 145 lb (65.772 kg)  02/15/12 140 lb (63.504 kg)  11/25/11 140 lb (63.504 kg)   amb wf with classic voice fatigue and pseudowheeze HEENT mild turbinate edema.  Oropharynx no thrush or excess pnd or cobblestoning.  No JVD or cervical adenopathy. NOaccessory muscle hypertrophy. Trachea midline, nl thryroid. Chest was min hyperinflated by percussion with min  diminished breath sounds and min increased exp time without wheeze. Hoover sign positive at very end of inspiration. Regular rate and rhythm without murmur gallop or rub or increase P2 or edema.  Abd: no hsm, nl excursion. Ext warm without cyanosis or clubbing.     CXR  05/16/2012 : Bibasilar atelectasis. Small bilateral effusions. Cardiomegaly, hyperinflation.      Assessment & Plan:

## 2012-05-16 NOTE — Patient Instructions (Addendum)
Stop lisinopril and start benicar 20 mg one half daily and cough / choking/ breathing will gradually improve over the next 4 weeks  In meantime as long as coughing take Pepcid 20 mg on at bedtime and continue protonix before bfast daily and no more fish oil  Take delsym two tsp every 12 hours and supplement if needed with  tramadol 50 mg up to 2 every 4 hours to suppress the urge to cough. Swallowing water or using ice chips/non mint and menthol containing candies (such as lifesavers or sugarless jolly ranchers) are also effective.  You should rest your voice and avoid activities that you know make you cough.  Once you have eliminated the cough for 3 straight days try reducing the tramadol first,  then the delsym as tolerated.    Please remember to go to the x-ray department downstairs for your tests - we will call you with the results when they are available.     Please schedule a follow up office visit in 4 weeks, sooner if needed with pfts

## 2012-05-17 ENCOUNTER — Telehealth: Payer: Self-pay | Admitting: Internal Medicine

## 2012-05-17 MED ORDER — TRAMADOL HCL 50 MG PO TABS: ORAL_TABLET | ORAL | Status: DC

## 2012-05-17 NOTE — Assessment & Plan Note (Signed)
Strongly suspect  Classic Upper airway cough syndrome, so named because it's frequently impossible to sort out how much is  CR/sinusitis with freq throat clearing (which can be related to primary GERD)   vs  causing  secondary (" extra esophageal")  GERD from wide swings in gastric pressure that occur with throat clearing, often  promoting self use of mint and menthol lozenges that reduce the lower esophageal sphincter tone and exacerbate the problem further in a cyclical fashion.   These are the same pts (now being labeled as having "irritable larynx syndrome" by some cough centers) who not infrequently have a history of having failed to tolerate ace inhibitors,  dry powder inhalers or biphosphonates or report having atypical reflux symptoms that don't respond to standard doses of PPI , and are easily confused as having aecopd or asthma flares by even experienced allergists/ pulmonologists.   Cynthia Frank clearly has a sense of excessive chronic pnds but when takes [place] it doesn't bother her so I suspect the pnds may be the "spark" but not the fuel driving the upper airway instability/ inflammatory process  Rec max gerd rx and off acei x one month then regroup

## 2012-05-17 NOTE — Telephone Encounter (Signed)
Called pt with cxr results and she stated the Tramadol never got sent to her pharmacy. RX has been sent.

## 2012-05-17 NOTE — Assessment & Plan Note (Signed)

## 2012-05-21 DIAGNOSIS — R0602 Shortness of breath: Secondary | ICD-10-CM | POA: Diagnosis not present

## 2012-05-21 DIAGNOSIS — Z79899 Other long term (current) drug therapy: Secondary | ICD-10-CM | POA: Diagnosis not present

## 2012-05-21 DIAGNOSIS — I428 Other cardiomyopathies: Secondary | ICD-10-CM | POA: Diagnosis not present

## 2012-05-24 ENCOUNTER — Telehealth: Payer: Self-pay | Admitting: Internal Medicine

## 2012-05-24 NOTE — Telephone Encounter (Signed)
Please read phone note below and advise. 

## 2012-05-24 NOTE — Telephone Encounter (Signed)
Caller: Cynthia Frank/Patient; Phone: [phone]; Reason for Call: Patient is concerned about her blood test - from cardiologist office.  Thyroid level was elevated.  She is concerned what should she do?  She is going out of town on Monday 05/28/12 and will be gone for two weeks.  DR.NORINS IS NOT IN OFFICE 05/24/12 or 05/25/12.  Patient is concerned about what she should do?  PLEASE CONTACT [phone] OR [phone]

## 2012-05-24 NOTE — Telephone Encounter (Signed)
Patient says that Dr. Alvera Novel should be receiving lab results showing that her thyroid levels are too high and she is going out of town on Monday so she would like to know what he wants her to do

## 2012-05-25 ENCOUNTER — Telehealth: Payer: Self-pay | Admitting: *Deleted

## 2012-05-25 DIAGNOSIS — Z7901 Long term (current) use of anticoagulants: Secondary | ICD-10-CM | POA: Diagnosis not present

## 2012-05-25 DIAGNOSIS — Z79899 Other long term (current) drug therapy: Secondary | ICD-10-CM | POA: Diagnosis not present

## 2012-05-25 DIAGNOSIS — I10 Essential (primary) hypertension: Secondary | ICD-10-CM | POA: Diagnosis not present

## 2012-05-25 DIAGNOSIS — I4891 Unspecified atrial fibrillation: Secondary | ICD-10-CM | POA: Diagnosis not present

## 2012-05-25 DIAGNOSIS — I428 Other cardiomyopathies: Secondary | ICD-10-CM | POA: Diagnosis not present

## 2012-05-25 NOTE — Telephone Encounter (Signed)
Called pt and told her that her TSH was 4.29 - not a problem. No treatment or testing needed.

## 2012-05-25 NOTE — Telephone Encounter (Signed)
TSH 4.29 - not a problem. No treatment or testing needed.

## 2012-05-25 NOTE — Telephone Encounter (Signed)
Please read phone note below and advise. 

## 2012-05-30 DIAGNOSIS — I4891 Unspecified atrial fibrillation: Secondary | ICD-10-CM | POA: Diagnosis not present

## 2012-05-30 DIAGNOSIS — Z7901 Long term (current) use of anticoagulants: Secondary | ICD-10-CM | POA: Diagnosis not present

## 2012-05-30 DIAGNOSIS — Z79899 Other long term (current) drug therapy: Secondary | ICD-10-CM | POA: Diagnosis not present

## 2012-05-30 DIAGNOSIS — I495 Sick sinus syndrome: Secondary | ICD-10-CM | POA: Diagnosis not present

## 2012-06-13 DIAGNOSIS — I4891 Unspecified atrial fibrillation: Secondary | ICD-10-CM | POA: Diagnosis not present

## 2012-06-13 DIAGNOSIS — Z7901 Long term (current) use of anticoagulants: Secondary | ICD-10-CM | POA: Diagnosis not present

## 2012-06-19 ENCOUNTER — Other Ambulatory Visit: Payer: Self-pay | Admitting: *Deleted

## 2012-06-19 MED ORDER — FLUTICASONE PROPIONATE 50 MCG/ACT NA SUSP: 1.0000 | Freq: Every day | NASAL | Status: DC

## 2012-06-29 ENCOUNTER — Ambulatory Visit: Payer: Medicare Other | Admitting: Internal Medicine

## 2012-07-02 DIAGNOSIS — Z79899 Other long term (current) drug therapy: Secondary | ICD-10-CM | POA: Diagnosis not present

## 2012-07-02 DIAGNOSIS — Z1231 Encounter for screening mammogram for malignant neoplasm of breast: Secondary | ICD-10-CM | POA: Diagnosis not present

## 2012-07-02 DIAGNOSIS — Z7901 Long term (current) use of anticoagulants: Secondary | ICD-10-CM | POA: Diagnosis not present

## 2012-07-02 DIAGNOSIS — I4891 Unspecified atrial fibrillation: Secondary | ICD-10-CM | POA: Diagnosis not present

## 2012-07-02 DIAGNOSIS — E7800 Pure hypercholesterolemia, unspecified: Secondary | ICD-10-CM | POA: Diagnosis not present

## 2012-07-18 ENCOUNTER — Encounter: Payer: Self-pay | Admitting: Internal Medicine

## 2012-07-30 DIAGNOSIS — I4891 Unspecified atrial fibrillation: Secondary | ICD-10-CM | POA: Diagnosis not present

## 2012-07-30 DIAGNOSIS — Z7901 Long term (current) use of anticoagulants: Secondary | ICD-10-CM | POA: Diagnosis not present

## 2012-08-07 DIAGNOSIS — H2510 Age-related nuclear cataract, unspecified eye: Secondary | ICD-10-CM | POA: Diagnosis not present

## 2012-08-09 DIAGNOSIS — I4891 Unspecified atrial fibrillation: Secondary | ICD-10-CM | POA: Diagnosis not present

## 2012-08-09 DIAGNOSIS — Z7901 Long term (current) use of anticoagulants: Secondary | ICD-10-CM | POA: Diagnosis not present

## 2012-08-22 DIAGNOSIS — Z79899 Other long term (current) drug therapy: Secondary | ICD-10-CM | POA: Diagnosis not present

## 2012-08-22 DIAGNOSIS — E7800 Pure hypercholesterolemia, unspecified: Secondary | ICD-10-CM | POA: Diagnosis not present

## 2012-08-22 DIAGNOSIS — I428 Other cardiomyopathies: Secondary | ICD-10-CM | POA: Diagnosis not present

## 2012-08-22 DIAGNOSIS — Z7901 Long term (current) use of anticoagulants: Secondary | ICD-10-CM | POA: Diagnosis not present

## 2012-08-22 DIAGNOSIS — I4891 Unspecified atrial fibrillation: Secondary | ICD-10-CM | POA: Diagnosis not present

## 2012-08-22 DIAGNOSIS — I4949 Other premature depolarization: Secondary | ICD-10-CM | POA: Diagnosis not present

## 2012-09-12 DIAGNOSIS — Z7901 Long term (current) use of anticoagulants: Secondary | ICD-10-CM | POA: Diagnosis not present

## 2012-09-12 DIAGNOSIS — I4891 Unspecified atrial fibrillation: Secondary | ICD-10-CM | POA: Diagnosis not present

## 2012-09-17 DIAGNOSIS — M19079 Primary osteoarthritis, unspecified ankle and foot: Secondary | ICD-10-CM | POA: Diagnosis not present

## 2012-10-02 DIAGNOSIS — I4891 Unspecified atrial fibrillation: Secondary | ICD-10-CM | POA: Diagnosis not present

## 2012-10-02 DIAGNOSIS — Z7901 Long term (current) use of anticoagulants: Secondary | ICD-10-CM | POA: Diagnosis not present

## 2012-10-30 DIAGNOSIS — I4891 Unspecified atrial fibrillation: Secondary | ICD-10-CM | POA: Diagnosis not present

## 2012-10-30 DIAGNOSIS — Z7901 Long term (current) use of anticoagulants: Secondary | ICD-10-CM | POA: Diagnosis not present

## 2012-11-19 ENCOUNTER — Encounter: Payer: Self-pay | Admitting: Gastroenterology

## 2012-11-28 DIAGNOSIS — I4891 Unspecified atrial fibrillation: Secondary | ICD-10-CM | POA: Diagnosis not present

## 2012-11-28 DIAGNOSIS — Z7901 Long term (current) use of anticoagulants: Secondary | ICD-10-CM | POA: Diagnosis not present

## 2012-12-10 ENCOUNTER — Encounter: Payer: Self-pay | Admitting: *Deleted

## 2012-12-11 ENCOUNTER — Ambulatory Visit (INDEPENDENT_AMBULATORY_CARE_PROVIDER_SITE_OTHER): Payer: Medicare Other | Admitting: Physician Assistant

## 2012-12-11 ENCOUNTER — Encounter: Payer: Self-pay | Admitting: Physician Assistant

## 2012-12-11 VITALS — HR 68 | Ht 65.5 in | Wt 139.0 lb

## 2012-12-11 DIAGNOSIS — Z7901 Long term (current) use of anticoagulants: Secondary | ICD-10-CM | POA: Diagnosis not present

## 2012-12-11 DIAGNOSIS — I4891 Unspecified atrial fibrillation: Secondary | ICD-10-CM

## 2012-12-11 DIAGNOSIS — Z86010 Personal history of colonic polyps: Secondary | ICD-10-CM | POA: Diagnosis not present

## 2012-12-11 DIAGNOSIS — Z860101 Personal history of adenomatous and serrated colon polyps: Secondary | ICD-10-CM | POA: Insufficient documentation

## 2012-12-11 DIAGNOSIS — Z85038 Personal history of other malignant neoplasm of large intestine: Secondary | ICD-10-CM

## 2012-12-11 MED ORDER — MOVIPREP 100 G PO SOLR: 1.0000 | Freq: Once | ORAL | Status: DC

## 2012-12-11 NOTE — Progress Notes (Signed)
Subjective:    Patient ID: Cynthia Frank, female    DOB: April 21, 1935, 77 y.o.   MRN: [id number]  HPI  Cynthia Frank is a very nice 77 year old white female known to Dr. Russella Dar. She has history of a T3 N0 colon cancer which was diagnosed in December of 2007. She is status post right hemicolectomy. She also has history of ovarian cancer. Patient is currently on chronic Coumadin for atrial fibrillation.  She last underwent colonoscopy in August of 2012 and was found to have 210-12 mm polyps both of which were serrated adenomas. She returns today to discuss followup colonoscopy. She has no current GI complaints. Specifically no abdominal pain, change in bowel habits, melena or hematochezia. She says she has been stable from a cardiac standpoint. She has been having problems with her right ankle which has not healed correctly after a fracture.     Review of Systems  Constitutional: Negative.   HENT: Negative.   Eyes: Negative.   Respiratory: Negative.   Cardiovascular: Negative.   Gastrointestinal: Negative.   Endocrine: Negative.   Genitourinary: Negative.   Musculoskeletal: Positive for arthralgias and gait problem.  Skin: Negative.   Allergic/Immunologic: Negative.   Neurological: Negative.   Hematological: Negative.   Psychiatric/Behavioral: Negative.    Outpatient Prescriptions Prior to Visit  Medication Sig Dispense Refill  . amiodarone (PACERONE) 200 MG tablet Take 200 mg by mouth daily.       Marland Kitchen atorvastatin (LIPITOR) 10 MG tablet Take 10 mg by mouth daily.      . Coenzyme Q10 (COQ10) 100 MG CAPS Take 1 capsule by mouth daily.       . fluticasone (FLONASE) 50 MCG/ACT nasal spray Place 1 spray into the nose daily.  16 g  3  . furosemide (LASIX) 20 MG tablet Take 20 mg by mouth daily.       . metoprolol (LOPRESSOR) 50 MG tablet Take 50 mg by mouth 2 (two) times daily.      . MULTIPLE VITAMINS PO Take by mouth daily.        Marland Kitchen olmesartan (BENICAR) 20 MG tablet One half daily      .  potassium chloride SA (K-DUR,KLOR-CON) 20 MEQ tablet Take 40 mEq by mouth daily.       . traMADol (ULTRAM) 50 MG tablet Take 2 by mouth every 4 hours as needed for cough  40 tablet  0  . tretinoin (RETIN-A) 0.1 % cream Apply topically at bedtime.        Marland Kitchen warfarin (COUMADIN) 5 MG tablet Take 2.5-5 mg by mouth daily. Pt takes 2.5mg  on tues and sun and pt takes 5mg  on every other day      . zolpidem (AMBIEN) 10 MG tablet Take 0.5 tablets (5 mg total) by mouth at bedtime as needed.  30 tablet  0  . pantoprazole (PROTONIX) 40 MG tablet Take 1 tablet (40 mg total) by mouth daily. Take 30-60 min before first meal of the day  30 tablet  2  . phenazopyridine (PYRIDIUM) 200 MG tablet Take 1 tablet (200 mg total) by mouth 3 (three) times daily as needed for pain.  12 tablet  0  . ramipril (ALTACE) 5 MG capsule        No facility-administered medications prior to visit.   Allergies  Allergen Reactions  . Clindamycin/Lincomycin     itching  . Sulfonamide Derivatives     REACTION: Itching/Swelling   Patient Active Problem List   Diagnosis Date Noted  .  Hx of adenomatous colonic polyps 12/11/2012  . Chronic anticoagulation 12/11/2012  . Atrial fibrillation 12/11/2012  . Cough 05/16/2012  . UTI (urinary tract infection) 11/25/2011  . Ankle fracture, bimalleolar, closed 10/15/2011  . OTH&UNSPEC NONINFECTIOUS GASTROENTERITIS&COLITIS 11/26/2008  . INFECTIOUS DIARRHEA 09/12/2008  . DIARRHEA-PRESUMED INFECTIOUS 09/12/2008  . CARCINOMA, ASCENDING COLON 09/12/2008  . HEMORRHOIDS-INTERNAL 08/26/2008  . DIARRHEA 08/26/2008  . HOT FLASHES 09/17/2007  . THYROID NODULE 04/27/2007  . HYPERTENSION 04/27/2007  . UTI 04/27/2007  . Personal History of Malignant Neoplasm of Large Intestine 04/27/2007     family history includes Heart disease in her father; Prostate cancer in her father; Uterine cancer in her mother. There is no history of Colon cancer. History  Substance Use Topics  . Smoking status: Former  Smoker -- 0.50 packs/day for 20 years    Types: Cigarettes    Quit date: 04/18/1978  . Smokeless tobacco: Not on file  . Alcohol Use: 7.0 oz/week    14 drink(s) per week    Objective:   Physical Exam  well-developed white female in no acute distress, pleasant blood pressure 100/60 pulse 68 height 5 foot 5 weight 139. HEENT nontraumatic normocephalic EOMI PERRLA sclera anicteric, Supple no JVD, Cardiovascular regular rate and rhythm with S1-S2 no murmur or gallop, Pulmonary clear bilaterally, Abdomen soft bowel sounds are active there is no palpable mass or hepatosplenomegaly she does have a midline incisional scar, Rectal exam not done, Extremities no clubbing or cyanosis she does have edema in the right ankle. Psych mood and affect normal and appropriate        Assessment & Plan:  #53 77 year old white female with personal history of T3 N0 colon cancer 2007, status post right hemicolectomy #2 personal history of adenomatous colon polyps, last colonoscopy August 2012 #3 chronic anticoagulation with Coumadin #4 atrial fibrillation #5 hypertension #6 history of ovarian cancer  Plan patient is scheduled for colonoscopy with Dr. Russella Dar procedure was discussed in detail with the patient and she is agreeable to proceed . We will obtain consent from her cardiologist Dr. Mayford Knife to hold Coumadin 5 days prior to the colonoscopy

## 2012-12-11 NOTE — Patient Instructions (Addendum)

## 2012-12-11 NOTE — Progress Notes (Signed)
Reviewed and agree with management plan.  Mechel Haggard T. Cinthya Bors, MD FACG 

## 2012-12-12 DIAGNOSIS — Z7901 Long term (current) use of anticoagulants: Secondary | ICD-10-CM | POA: Diagnosis not present

## 2012-12-12 DIAGNOSIS — I4891 Unspecified atrial fibrillation: Secondary | ICD-10-CM | POA: Diagnosis not present

## 2012-12-13 ENCOUNTER — Telehealth: Payer: Self-pay | Admitting: *Deleted

## 2012-12-13 NOTE — Telephone Encounter (Signed)
I called the patient to inform her we heard back from Dr. Mayford Knife at [facility] regarding the coumadin.  He agreed to our recommendation for the patient to be off the coumadin for 5 days prior to the procedure on 02-14-2013.  I told her to stop the coumadin on 02-09-2013 and she can resume it on 02-15-2013.  I did tell her , depending on the procedure and any bleeding she may have had if polyps are removed we will tell her when to resume it.

## 2012-12-27 DIAGNOSIS — Z23 Encounter for immunization: Secondary | ICD-10-CM | POA: Diagnosis not present

## 2012-12-28 DIAGNOSIS — M19079 Primary osteoarthritis, unspecified ankle and foot: Secondary | ICD-10-CM | POA: Diagnosis not present

## 2013-01-09 DIAGNOSIS — Z7901 Long term (current) use of anticoagulants: Secondary | ICD-10-CM | POA: Diagnosis not present

## 2013-01-09 DIAGNOSIS — I4891 Unspecified atrial fibrillation: Secondary | ICD-10-CM | POA: Diagnosis not present

## 2013-01-09 DIAGNOSIS — Z79899 Other long term (current) drug therapy: Secondary | ICD-10-CM | POA: Diagnosis not present

## 2013-01-09 DIAGNOSIS — E7800 Pure hypercholesterolemia, unspecified: Secondary | ICD-10-CM | POA: Diagnosis not present

## 2013-01-11 ENCOUNTER — Other Ambulatory Visit: Payer: Self-pay | Admitting: Cardiology

## 2013-01-11 DIAGNOSIS — Z79899 Other long term (current) drug therapy: Secondary | ICD-10-CM

## 2013-01-11 DIAGNOSIS — I2510 Atherosclerotic heart disease of native coronary artery without angina pectoris: Secondary | ICD-10-CM

## 2013-02-05 ENCOUNTER — Other Ambulatory Visit: Payer: Self-pay | Admitting: General Surgery

## 2013-02-05 ENCOUNTER — Other Ambulatory Visit: Payer: Self-pay | Admitting: Internal Medicine

## 2013-02-05 MED ORDER — ATORVASTATIN CALCIUM 20 MG PO TABS: 20.0000 mg | ORAL_TABLET | Freq: Every day | ORAL | Status: DC

## 2013-02-08 ENCOUNTER — Telehealth: Payer: Self-pay

## 2013-02-08 DIAGNOSIS — R9430 Abnormal result of cardiovascular function study, unspecified: Secondary | ICD-10-CM

## 2013-02-08 NOTE — Telephone Encounter (Signed)
Message copied by Annett Fabian on Fri Feb 08, 2013  1:15 PM ------      Message from: Claudette Head T      Created: Thu Feb 07, 2013 11:18 AM      Regarding: RE: ASA IV patient       Please give her the option of repeating her echo to see if EF is above 35% so we can proceed in the [facility]-I would prefer this option. If she declines or EF remains low, please schedule during my next hospital week.                   ----- Message -----         From: Rossie Muskrat, RN         Sent: 02/07/2013   8:24 AM           To: Meryl Dare, MD      Subject: RE: ASA IV patient                                       ECHO was done on 10/2011.  Was 25-30 %.  Do you want me to repeat or set up at the hospital?      ----- Message -----         From: Meryl Dare, MD         Sent: 02/07/2013   8:11 AM           To: Rossie Muskrat, RN      Subject: FW: ASA IV patient                                       Please see if how recent her EF was obtained and if not in past year please schedule echocardiogram to reassess before moving to hospital.                   ----- Message -----         From: Cathlyn Parsons, CRNA         Sent: 02/06/2013   9:45 PM           To: Meryl Dare, MD      Subject: ASA IV patient                                           Dr. Russella Dar,            This pt was scheduled by Amy for a procedure on 10/30.  Unfortunately her EF is less than 35% so she does not qualify for [facility] at [facility].            Thanks,            John                    ------

## 2013-02-08 NOTE — Telephone Encounter (Signed)
Left message for patient to call back  

## 2013-02-11 NOTE — Telephone Encounter (Signed)
Patient is scheduled for ECHO at [facility] for tomorrow 02/12/13 11:30.  She verbalized understanding that if EF is not greater than 35% we will need to reschedule for Dr. Ardell Isaacs next hospital week

## 2013-02-12 ENCOUNTER — Ambulatory Visit (HOSPITAL_COMMUNITY): Payer: Medicare Other | Attending: Gastroenterology | Admitting: Radiology

## 2013-02-12 DIAGNOSIS — I428 Other cardiomyopathies: Secondary | ICD-10-CM | POA: Diagnosis not present

## 2013-02-12 DIAGNOSIS — I359 Nonrheumatic aortic valve disorder, unspecified: Secondary | ICD-10-CM | POA: Diagnosis not present

## 2013-02-12 DIAGNOSIS — I059 Rheumatic mitral valve disease, unspecified: Secondary | ICD-10-CM | POA: Diagnosis not present

## 2013-02-12 DIAGNOSIS — R9430 Abnormal result of cardiovascular function study, unspecified: Secondary | ICD-10-CM

## 2013-02-12 DIAGNOSIS — Z87891 Personal history of nicotine dependence: Secondary | ICD-10-CM | POA: Insufficient documentation

## 2013-02-12 DIAGNOSIS — I10 Essential (primary) hypertension: Secondary | ICD-10-CM | POA: Insufficient documentation

## 2013-02-12 DIAGNOSIS — I4891 Unspecified atrial fibrillation: Secondary | ICD-10-CM | POA: Diagnosis not present

## 2013-02-12 NOTE — Progress Notes (Signed)
Echocardiogram performed.  

## 2013-02-13 ENCOUNTER — Telehealth: Payer: Self-pay

## 2013-02-13 ENCOUNTER — Other Ambulatory Visit: Payer: Self-pay

## 2013-02-13 DIAGNOSIS — Z85038 Personal history of other malignant neoplasm of large intestine: Secondary | ICD-10-CM

## 2013-02-13 NOTE — Telephone Encounter (Signed)
EF 25-30 % on ECHO from yesterday.  Patient is rescheduled to the hospital for 03/11/13 9:15.  Patient is aware that she is rescheduled to 03/11/13 9:15.  She is instructed to resume her coumadin today.  She will hold it again 5 days prior to the procedure.  I will mail her new instructions

## 2013-02-14 ENCOUNTER — Encounter: Payer: Medicare Other | Admitting: Gastroenterology

## 2013-02-24 ENCOUNTER — Encounter: Payer: Self-pay | Admitting: Cardiology

## 2013-02-27 ENCOUNTER — Ambulatory Visit (INDEPENDENT_AMBULATORY_CARE_PROVIDER_SITE_OTHER): Payer: Medicare Other | Admitting: Cardiology

## 2013-02-27 ENCOUNTER — Other Ambulatory Visit: Payer: Self-pay | Admitting: Internal Medicine

## 2013-02-27 ENCOUNTER — Encounter: Payer: Self-pay | Admitting: Cardiology

## 2013-02-27 ENCOUNTER — Ambulatory Visit (INDEPENDENT_AMBULATORY_CARE_PROVIDER_SITE_OTHER): Payer: Medicare Other | Admitting: Pharmacist

## 2013-02-27 ENCOUNTER — Encounter: Payer: Self-pay | Admitting: General Surgery

## 2013-02-27 VITALS — BP 118/80 | HR 61 | Ht 65.5 in | Wt 138.0 lb

## 2013-02-27 DIAGNOSIS — I428 Other cardiomyopathies: Secondary | ICD-10-CM | POA: Diagnosis not present

## 2013-02-27 DIAGNOSIS — I4891 Unspecified atrial fibrillation: Secondary | ICD-10-CM

## 2013-02-27 DIAGNOSIS — I2510 Atherosclerotic heart disease of native coronary artery without angina pectoris: Secondary | ICD-10-CM | POA: Diagnosis not present

## 2013-02-27 DIAGNOSIS — E785 Hyperlipidemia, unspecified: Secondary | ICD-10-CM | POA: Insufficient documentation

## 2013-02-27 DIAGNOSIS — R7989 Other specified abnormal findings of blood chemistry: Secondary | ICD-10-CM

## 2013-02-27 DIAGNOSIS — Z7901 Long term (current) use of anticoagulants: Secondary | ICD-10-CM

## 2013-02-27 DIAGNOSIS — I4949 Other premature depolarization: Secondary | ICD-10-CM

## 2013-02-27 DIAGNOSIS — I10 Essential (primary) hypertension: Secondary | ICD-10-CM

## 2013-02-27 DIAGNOSIS — I493 Ventricular premature depolarization: Secondary | ICD-10-CM | POA: Insufficient documentation

## 2013-02-27 LAB — COMPREHENSIVE METABOLIC PANEL
ALT: 48 U/L — ABNORMAL HIGH (ref 0–35)
AST: 50 U/L — ABNORMAL HIGH (ref 0–37)
Albumin: 3.9 g/dL (ref 3.5–5.2)
Alkaline Phosphatase: 62 U/L (ref 39–117)
BUN: 13 mg/dL (ref 6–23)
CO2: 29 mEq/L (ref 19–32)
Calcium: 8.7 mg/dL (ref 8.4–10.5)
Chloride: 98 mEq/L (ref 96–112)
Creatinine, Ser: 0.9 mg/dL (ref 0.4–1.2)
GFR: 64.03 mL/min (ref >60.00)
Glucose, Bld: 91 mg/dL (ref 70–99)
Potassium: 3.5 mEq/L (ref 3.5–5.1)
Sodium: 136 mEq/L (ref 135–145)
Total Bilirubin: 1.1 mg/dL (ref 0.3–1.2)
Total Protein: 6.9 g/dL (ref 6.0–8.3)

## 2013-02-27 LAB — TSH: TSH: 2.63 u[IU]/mL (ref 0.35–5.50)

## 2013-02-27 LAB — POCT INR: INR: 1.9

## 2013-02-27 NOTE — Progress Notes (Signed)
[address] [place], [place]  [address] Phone: [phone] Fax:  [phone]  Date:  02/27/2013   ID:  Cynthia Frank, DOB 10/14/1935, MRN [id number]  PCP:  Michael Norins, MD  Cardiologist:  Traci Turner, MD   CC:  6 month followup   History of Present Illness: Cynthia Frank is a 77 y.o. female with a history of Adriamycin induced nonischemic DCM, HTN , PAF and PVC's who presents today for followup.  She is doing well.  She denies any chest pain, SOB, DOE, LE edema, dizziness, palpitations or syncope.     Wt Readings from Last 3 Encounters:  02/27/13 138 lb (62.596 kg)  12/11/12 139 lb (63.05 kg)  05/16/12 145 lb (65.772 kg)     Past Medical History  Diagnosis Date  . Hx: UTI (urinary tract infection)   . Nonischemic cardiomyopathy     chemo related EF 20-25% but 41% by nuclear stress test 04/2012 and repeat echo 01/2013 with EF 25-30%  . History of ovarian cancer 1985  . Partial bowel obstruction   . Internal hemorrhoid   . Colon cancer 03/2006    T3, N0  . Colon polyps 12/07/2010  . Hypertension   . Atrial fibrillation   . PVC (premature ventricular contraction)   . Coronary artery disease     nonobstructive with 30% D1  . Hyperlipidemia     Current Outpatient Prescriptions  Medication Sig Dispense Refill  . amiodarone (PACERONE) 200 MG tablet Take 200 mg by mouth daily.       . atorvastatin (LIPITOR) 20 MG tablet Take 1 tablet (20 mg total) by mouth daily.  90 tablet  3  . Coenzyme Q10 (COQ10) 100 MG CAPS Take 1 capsule by mouth daily.       . fluticasone (FLONASE) 50 MCG/ACT nasal spray INSTILL 1 SPRAY IN EACH NOSTRIL DAILY  16 g  5  . furosemide (LASIX) 20 MG tablet Take 20 mg by mouth daily.       . metoprolol succinate (TOPROL-XL) 25 MG 24 hr tablet Take 25 mg by mouth daily.       . MOVIPREP 100 G SOLR Take 1 kit (200 g total) by mouth once. "Pharmacist please use BIN: [id number] GROUP: [id number] ID: [id number] Call -1 [phone] for  pharmacy questions "Pt will save $10"  1 kit  0  . MULTIPLE VITAMINS PO Take by mouth daily.        . olmesartan (BENICAR) 20 MG tablet One half daily      . potassium chloride SA (K-DUR,KLOR-CON) 20 MEQ tablet Take 40 mEq by mouth daily.       . traMADol (ULTRAM) 50 MG tablet Take 2 by mouth every 4 hours as needed for cough  40 tablet  0  . tretinoin (RETIN-A) 0.1 % cream Apply topically at bedtime.        . warfarin (COUMADIN) 5 MG tablet Take 2.5-5 mg by mouth daily. Pt takes 2.5mg on tues and sun and pt takes 5mg on every other day      . zolpidem (AMBIEN) 10 MG tablet Take 0.5 tablets (5 mg total) by mouth at bedtime as needed.  30 tablet  0   No current facility-administered medications for this visit.    Allergies:    Allergies  Allergen Reactions  . Clindamycin/Lincomycin     itching  . Sulfonamide Derivatives     REACTION: Itching/Swelling    Social History:  The patient    reports that she quit smoking about 34 years ago. Her smoking use included Cigarettes. She has a 10 pack-year smoking history. She does not have any smokeless tobacco history on file. She reports that she drinks about 8.4 ounces of alcohol per week. She reports that she does not use illicit drugs.   Family History:  The patient's family history includes Heart attack in her father; Heart disease in her father; Prostate cancer in her father; Uterine cancer in her mother. There is no history of Colon cancer.   ROS:  Please see the history of present illness.      All other systems reviewed and negative.   PHYSICAL EXAM: VS:  BP 118/80  Pulse 61  Ht 5' 5.5" (1.664 m)  Wt 138 lb (62.596 kg)  BMI 22.61 kg/m2 Well nourished, well developed, in no acute distress HEENT: normal Neck: no JVD Cardiac:  normal S1, S2; RRR; no murmur Lungs:  clear to auscultation bilaterally, no wheezing, rhonchi or rales Abd: soft, nontender, no hepatomegaly Ext: no edema Skin: warm and dry Neuro:  CNs 2-12 intact, no focal  abnormalities noted  EKG:  NSR with LBBB  At 61bpm     ASSESSMENT AND PLAN:  1. Nonischemic DCM secondary to Adriamycin - her most recent EF by echo was 25-30% by echo but it was 41% by nuclear stress test a year ago.    - continue beta blocker/ARB/Lasix   - I will get a MUGA scan to get a more accurate EF assessment  - she is drinking 2 glasses of wine daily and I have discussed with her the cardiotoxic effects of alcohol on the heart muscle.  I have encouraged her to cut back and stop alcohol intake. 2. HTN - controlled  - continue metoprolol/Benicar 3. PVC's  - continue Metoprolol 4. PAF - no reoccurence  - continue Amiodarone/metoprolol/warfarin  - recheck PFT's with [facility] for amio monitoring  - check TSH/CMET 5.   Nonobstructive ASCAD of the diagonal with no angina 5.   Dyslipidemia - LDL at goal  - continue atorvastatin   Followup with me in 6 months  Signed, Traci Turner, MD 02/27/2013 10:03 AM  

## 2013-02-27 NOTE — Patient Instructions (Signed)
Your physician recommends that you continue on your current medications as directed. Please refer to the Current Medication list given to you today.  Your physician recommends that you go to the lab today after your visit for a Complete Metabolic Panel and a TSH thyroid Panel  Your physician has requested that you have a MUGA: A multigated acquisition (MUGA) scan is a test that looks at the chambers and blood vessels of the heart. Please see the CareNotes handout/brochure given to you today for further information.  Your physician has recommended that you have a pulmonary function test. Pulmonary Function Tests are a group of tests that measure how well air moves in and out of your lungs.  Your physician wants you to follow-up in: 6 Months with Dr. Sherlyn Lick will receive a reminder letter in the mail two months in advance. If you don't receive a letter, please call our office to schedule the follow-up appointment.

## 2013-03-06 DIAGNOSIS — M19079 Primary osteoarthritis, unspecified ankle and foot: Secondary | ICD-10-CM | POA: Diagnosis not present

## 2013-03-08 ENCOUNTER — Other Ambulatory Visit: Payer: Self-pay | Admitting: General Surgery

## 2013-03-08 ENCOUNTER — Other Ambulatory Visit (INDEPENDENT_AMBULATORY_CARE_PROVIDER_SITE_OTHER): Payer: Medicare Other

## 2013-03-08 ENCOUNTER — Encounter: Payer: Self-pay | Admitting: General Surgery

## 2013-03-08 DIAGNOSIS — Z79899 Other long term (current) drug therapy: Secondary | ICD-10-CM | POA: Diagnosis not present

## 2013-03-08 LAB — BASIC METABOLIC PANEL
BUN: 18 mg/dL (ref 6–23)
CO2: 27 mEq/L (ref 19–32)
Calcium: 8.7 mg/dL (ref 8.4–10.5)
Chloride: 99 mEq/L (ref 96–112)
Creatinine, Ser: 1 mg/dL (ref 0.4–1.2)
GFR: 55.5 mL/min — ABNORMAL LOW (ref >60.00)
Glucose, Bld: 77 mg/dL (ref 70–99)
Potassium: 3.6 mEq/L (ref 3.5–5.1)
Sodium: 135 mEq/L (ref 135–145)

## 2013-03-11 ENCOUNTER — Encounter (HOSPITAL_COMMUNITY)
Admission: RE | Disposition: A | Discharge status: Home / Self Care | Payer: Self-pay | Source: Ambulatory Visit | Attending: Gastroenterology

## 2013-03-11 ENCOUNTER — Ambulatory Visit (HOSPITAL_COMMUNITY)
Admission: RE | Admit: 2013-03-11 | Discharge: 2013-03-11 | Disposition: A | Discharge status: Home / Self Care | Payer: Medicare Other | Source: Ambulatory Visit | Attending: Gastroenterology | Admitting: Gastroenterology

## 2013-03-11 ENCOUNTER — Encounter (HOSPITAL_COMMUNITY): Payer: Self-pay | Admitting: Gastroenterology

## 2013-03-11 DIAGNOSIS — I4949 Other premature depolarization: Secondary | ICD-10-CM | POA: Diagnosis not present

## 2013-03-11 DIAGNOSIS — Z85038 Personal history of other malignant neoplasm of large intestine: Secondary | ICD-10-CM | POA: Insufficient documentation

## 2013-03-11 DIAGNOSIS — I4891 Unspecified atrial fibrillation: Secondary | ICD-10-CM | POA: Diagnosis not present

## 2013-03-11 DIAGNOSIS — I429 Cardiomyopathy, unspecified: Secondary | ICD-10-CM | POA: Diagnosis not present

## 2013-03-11 DIAGNOSIS — Z1211 Encounter for screening for malignant neoplasm of colon: Secondary | ICD-10-CM | POA: Insufficient documentation

## 2013-03-11 DIAGNOSIS — T451X5A Adverse effect of antineoplastic and immunosuppressive drugs, initial encounter: Secondary | ICD-10-CM | POA: Insufficient documentation

## 2013-03-11 DIAGNOSIS — Z8543 Personal history of malignant neoplasm of ovary: Secondary | ICD-10-CM | POA: Insufficient documentation

## 2013-03-11 DIAGNOSIS — Z7901 Long term (current) use of anticoagulants: Secondary | ICD-10-CM | POA: Diagnosis not present

## 2013-03-11 DIAGNOSIS — E785 Hyperlipidemia, unspecified: Secondary | ICD-10-CM | POA: Diagnosis not present

## 2013-03-11 DIAGNOSIS — I10 Essential (primary) hypertension: Secondary | ICD-10-CM | POA: Diagnosis not present

## 2013-03-11 DIAGNOSIS — D126 Benign neoplasm of colon, unspecified: Secondary | ICD-10-CM | POA: Diagnosis not present

## 2013-03-11 DIAGNOSIS — K648 Other hemorrhoids: Secondary | ICD-10-CM | POA: Diagnosis not present

## 2013-03-11 HISTORY — PX: COLONOSCOPY: SHX5424

## 2013-03-11 SURGERY — COLONOSCOPY
Anesthesia: Moderate Sedation

## 2013-03-11 MED ORDER — FENTANYL CITRATE 0.05 MG/ML IJ SOLN
INTRAMUSCULAR | Status: DC | PRN
  Administered 2013-03-11 (×2): 25 ug via INTRAVENOUS

## 2013-03-11 MED ORDER — FENTANYL CITRATE 0.05 MG/ML IJ SOLN
INTRAMUSCULAR | Status: AC
  Filled 2013-03-11: qty 2

## 2013-03-11 MED ORDER — SODIUM CHLORIDE 0.9 % IV SOLN
INTRAVENOUS | Status: DC
  Administered 2013-03-11: 500 mL via INTRAVENOUS

## 2013-03-11 MED ORDER — MIDAZOLAM HCL 5 MG/5ML IJ SOLN
INTRAMUSCULAR | Status: DC | PRN
  Administered 2013-03-11 (×3): 2 mg via INTRAVENOUS

## 2013-03-11 MED ORDER — MIDAZOLAM HCL 10 MG/2ML IJ SOLN
INTRAMUSCULAR | Status: AC
  Filled 2013-03-11: qty 2

## 2013-03-11 NOTE — Op Note (Signed)
[facility] [address] [place], [address]   COLONOSCOPY PROCEDURE REPORT  PATIENT: Cynthia Frank, Cynthia Frank  MR#: [id number] BIRTHDATE: 1935-09-11 , 77  yrs. old GENDER: Female ENDOSCOPIST: Meryl Dare, MD, [facility] PROCEDURE DATE:  03/11/2013 PROCEDURE:   Colonoscopy with snare polypectomy First Screening Colonoscopy - Avg.  risk and is 50 yrs.  old or older - No.  Prior Negative Screening - Now for repeat screening. N/A  History of Adenoma - Now for follow-up colonoscopy & has been > or = to 3 yrs.  N/A  Polyps Removed Today? Yes. ASA CLASS:   Class III INDICATIONS:High risk patient with personal history of colon cancer.  MEDICATIONS: medications were titrated to patient response per physician's verbal order, Fentanyl 75 mcg IV, and Versed 6 mg IV DESCRIPTION OF PROCEDURE:   After the risks benefits and alternatives of the procedure were thoroughly explained, informed consent was obtained.  A digital rectal exam revealed no abnormalities of the rectum.   The Pentax Ped Colon [id number] endoscope was introduced through the anus and advanced to the surgical anastomosis. No adverse events experienced.   The quality of the prep was excellent, using MoviPrep  The instrument was then slowly withdrawn as the colon was fully examined.  COLON FINDINGS: Three sessile polyps measuring 6-8 mm in size were found in the ascending colon, transverse colon, and descending colon.  A polypectomy was performed with a cold snare.  The resection was complete and the polyp tissue was completely retrieved.  There was evidence of a prior ileocolonic surgical anastomosis in the ascending colon.   The colon was otherwise normal.  There was no diverticulosis, inflammation, polyps or cancers unless previously stated.  Retroflexed views revealed small internal hemorrhoids. The time to cecum=3 minutes 00 seconds. Withdrawal time=12 minutes 00 seconds.  The scope was withdrawn and the  procedure completed.  COMPLICATIONS: There were no complications.  ENDOSCOPIC IMPRESSION: 1.   Three sessile polyps, 6-8 mm, ascending, transverse, and descending colon; polypectomy performed with a cold snare 2.   Prior ileocolonic surgical anastomosis in the ascending colon 3.   Small internal hemorrhoids  RECOMMENDATIONS: 1.  Hold aspirin, aspirin products, and anti-inflammatory medication for 2 weeks. 2.  Resume Coumadin (warfarin) today and have your PT/INR checked within 1 week. 3.  Repeat Colonoscopy in 2 years.  eSigned:  Meryl Dare, MD, [facility] 03/11/2013 9:48 AM

## 2013-03-11 NOTE — Interval H&P Note (Signed)
History and Physical Interval Note:  03/11/2013 8:31 AM  Cynthia Frank  has presented today for surgery, with the diagnosis of hx colon cancer Hx chronic antigoagulation  The various methods of treatment have been discussed with the patient and family. After consideration of risks, benefits and other options for treatment, the patient has consented to  Procedure(s): COLONOSCOPY (N/A) as a surgical intervention .  The patient's history has been reviewed, patient examined, no change in status, stable for surgery.  I have reviewed the patient's chart and labs.  Questions were answered to the patient's satisfaction.     Venita Lick. Russella Dar MD Clementeen Graham

## 2013-03-11 NOTE — H&P (View-Only) (Signed)
[address] [place], [place]  [address] Phone: [phone] Fax:  [phone]  Date:  02/27/2013   ID:  Cynthia Frank, Cynthia Frank [birth year], MRN [id number]  PCP:  Illene Regulus, MD  Cardiologist:  Armanda Magic, MD   CC:  6 month followup   History of Present Illness: Cynthia Frank is a 77 y.o. female with a history of Adriamycin induced nonischemic DCM, HTN , PAF and PVC's who presents today for followup.  She is doing well.  She denies any chest pain, SOB, DOE, LE edema, dizziness, palpitations or syncope.     Wt Readings from Last 3 Encounters:  02/27/13 138 lb (62.596 kg)  12/11/12 139 lb (63.05 kg)  05/16/12 145 lb (65.772 kg)     Past Medical History  Diagnosis Date  . Hx: UTI (urinary tract infection)   . Nonischemic cardiomyopathy     chemo related EF 20-25% but 41% by nuclear stress test 04/2012 and repeat echo 01/2013 with EF 25-30%  . History of ovarian cancer 1985  . Partial bowel obstruction   . Internal hemorrhoid   . Colon cancer 03/2006    T3, N0  . Colon polyps 12/07/2010  . Hypertension   . Atrial fibrillation   . PVC (premature ventricular contraction)   . Coronary artery disease     nonobstructive with 30% D1  . Hyperlipidemia     Current Outpatient Prescriptions  Medication Sig Dispense Refill  . amiodarone (PACERONE) 200 MG tablet Take 200 mg by mouth daily.       Marland Kitchen atorvastatin (LIPITOR) 20 MG tablet Take 1 tablet (20 mg total) by mouth daily.  90 tablet  3  . Coenzyme Q10 (COQ10) 100 MG CAPS Take 1 capsule by mouth daily.       . fluticasone (FLONASE) 50 MCG/ACT nasal spray INSTILL 1 SPRAY IN EACH NOSTRIL DAILY  16 g  5  . furosemide (LASIX) 20 MG tablet Take 20 mg by mouth daily.       . metoprolol succinate (TOPROL-XL) 25 MG 24 hr tablet Take 25 mg by mouth daily.       Marland Kitchen MOVIPREP 100 G SOLR Take 1 kit (200 g total) by mouth once. "Pharmacist please use BIN: [id number] GROUP: [id number] ID: [id number] Call [phone] for  pharmacy questions "Pt will save $10"  1 kit  0  . MULTIPLE VITAMINS PO Take by mouth daily.        Marland Kitchen olmesartan (BENICAR) 20 MG tablet One half daily      . potassium chloride SA (K-DUR,KLOR-CON) 20 MEQ tablet Take 40 mEq by mouth daily.       . traMADol (ULTRAM) 50 MG tablet Take 2 by mouth every 4 hours as needed for cough  40 tablet  0  . tretinoin (RETIN-A) 0.1 % cream Apply topically at bedtime.        Marland Kitchen warfarin (COUMADIN) 5 MG tablet Take 2.5-5 mg by mouth daily. Pt takes 2.5mg  on tues and sun and pt takes 5mg  on every other day      . zolpidem (AMBIEN) 10 MG tablet Take 0.5 tablets (5 mg total) by mouth at bedtime as needed.  30 tablet  0   No current facility-administered medications for this visit.    Allergies:    Allergies  Allergen Reactions  . Clindamycin/Lincomycin     itching  . Sulfonamide Derivatives     REACTION: Itching/Swelling    Social History:  The patient  reports that she quit smoking about 34 years ago. Her smoking use included Cigarettes. She has a 10 pack-year smoking history. She does not have any smokeless tobacco history on file. She reports that she drinks about 8.4 ounces of alcohol per week. She reports that she does not use illicit drugs.   Family History:  The patient's family history includes Heart attack in her father; Heart disease in her father; Prostate cancer in her father; Uterine cancer in her mother. There is no history of Colon cancer.   ROS:  Please see the history of present illness.      All other systems reviewed and negative.   PHYSICAL EXAM: VS:  BP 118/80  Pulse 61  Ht 5' 5.5" (1.664 m)  Wt 138 lb (62.596 kg)  BMI 22.61 kg/m2 Well nourished, well developed, in no acute distress HEENT: normal Neck: no JVD Cardiac:  normal S1, S2; RRR; no murmur Lungs:  clear to auscultation bilaterally, no wheezing, rhonchi or rales Abd: soft, nontender, no hepatomegaly Ext: no edema Skin: warm and dry Neuro:  CNs 2-12 intact, no focal  abnormalities noted  EKG:  NSR with LBBB  At 61bpm     ASSESSMENT AND PLAN:  1. Nonischemic DCM secondary to Adriamycin - her most recent EF by echo was 25-30% by echo but it was 41% by nuclear stress test a year ago.    - continue beta blocker/ARB/Lasix   - I will get a MUGA scan to get a more accurate EF assessment  - she is drinking 2 glasses of wine daily and I have discussed with her the cardiotoxic effects of alcohol on the heart muscle.  I have encouraged her to cut back and stop alcohol intake. 2. HTN - controlled  - continue metoprolol/Benicar 3. PVC's  - continue Metoprolol 4. PAF - no reoccurence  - continue Amiodarone/metoprolol/warfarin  - recheck PFT's with [facility] for amio monitoring  - check TSH/CMET 5.   Nonobstructive ASCAD of the diagonal with no angina 5.   Dyslipidemia - LDL at goal  - continue atorvastatin   Followup with me in 6 months  Signed, Armanda Magic, MD 02/27/2013 10:03 AM

## 2013-03-12 ENCOUNTER — Encounter (HOSPITAL_COMMUNITY): Payer: Self-pay | Admitting: Gastroenterology

## 2013-03-12 ENCOUNTER — Encounter: Payer: Self-pay | Admitting: Cardiology

## 2013-03-12 ENCOUNTER — Ambulatory Visit (HOSPITAL_COMMUNITY): Payer: Medicare Other | Attending: Cardiology | Admitting: Radiology

## 2013-03-12 ENCOUNTER — Encounter: Payer: Self-pay | Admitting: Gastroenterology

## 2013-03-12 DIAGNOSIS — I428 Other cardiomyopathies: Secondary | ICD-10-CM

## 2013-03-12 DIAGNOSIS — I4891 Unspecified atrial fibrillation: Secondary | ICD-10-CM

## 2013-03-12 MED ORDER — TECHNETIUM TC 99M-LABELED RED BLOOD CELLS IV KIT
33.0000 | PACK | Freq: Once | INTRAVENOUS | Status: AC | PRN
  Administered 2013-03-12: 33 via INTRAVENOUS

## 2013-03-12 NOTE — Progress Notes (Signed)
Muga Study  Referring Provider:  Quintella Reichert, MD  Date of Procedure: 03/12/2013  Indication: IV started with #20g angio via Right Hand x 1 by C. Hasspacher,RN.  Patient tolerated well.  Muga Information:  The patient's red blood cells were labeled using the Ultra Tag method with 32.8 mci of Technetium 19m Pertechnetate.  The images were reconstructed in the Anterior, Lateral and Left Anterior oblique views.  Impression: Global hypokinesis of moderate degree.  LVEF 40%.

## 2013-03-13 ENCOUNTER — Telehealth: Payer: Self-pay

## 2013-03-13 NOTE — Telephone Encounter (Signed)
Phone call to patient letting her know she is due for her Prevnar vaccine. She agreed and was transferred to scheduling for an appt

## 2013-03-20 ENCOUNTER — Ambulatory Visit (INDEPENDENT_AMBULATORY_CARE_PROVIDER_SITE_OTHER): Payer: Medicare Other

## 2013-03-20 DIAGNOSIS — Z23 Encounter for immunization: Secondary | ICD-10-CM | POA: Diagnosis not present

## 2013-03-26 ENCOUNTER — Ambulatory Visit (INDEPENDENT_AMBULATORY_CARE_PROVIDER_SITE_OTHER): Payer: Medicare Other | Admitting: Pharmacist

## 2013-03-26 DIAGNOSIS — I4891 Unspecified atrial fibrillation: Secondary | ICD-10-CM

## 2013-03-26 LAB — POCT INR: INR: 1.8

## 2013-03-26 MED ORDER — POTASSIUM CHLORIDE CRYS ER 20 MEQ PO TBCR: EXTENDED_RELEASE_TABLET | ORAL | Status: DC

## 2013-03-26 MED ORDER — AMIODARONE HCL 200 MG PO TABS: 200.0000 mg | ORAL_TABLET | Freq: Every day | ORAL | Status: DC

## 2013-03-26 MED ORDER — FUROSEMIDE 20 MG PO TABS: 20.0000 mg | ORAL_TABLET | Freq: Every day | ORAL | Status: DC

## 2013-03-26 MED ORDER — WARFARIN SODIUM 2.5 MG PO TABS: ORAL_TABLET | ORAL | Status: DC

## 2013-04-09 ENCOUNTER — Ambulatory Visit (INDEPENDENT_AMBULATORY_CARE_PROVIDER_SITE_OTHER): Payer: Medicare Other | Admitting: Pharmacist

## 2013-04-09 DIAGNOSIS — I4891 Unspecified atrial fibrillation: Secondary | ICD-10-CM | POA: Diagnosis not present

## 2013-04-09 LAB — POCT INR: INR: 2.8

## 2013-04-13 ENCOUNTER — Other Ambulatory Visit: Payer: Self-pay | Admitting: Cardiology

## 2013-04-30 ENCOUNTER — Ambulatory Visit (INDEPENDENT_AMBULATORY_CARE_PROVIDER_SITE_OTHER): Payer: Medicare Other

## 2013-04-30 DIAGNOSIS — I4891 Unspecified atrial fibrillation: Secondary | ICD-10-CM

## 2013-04-30 LAB — POCT INR: INR: 2.5

## 2013-05-16 DIAGNOSIS — J019 Acute sinusitis, unspecified: Secondary | ICD-10-CM | POA: Diagnosis not present

## 2013-05-16 DIAGNOSIS — R059 Cough, unspecified: Secondary | ICD-10-CM | POA: Diagnosis not present

## 2013-05-16 DIAGNOSIS — R05 Cough: Secondary | ICD-10-CM | POA: Diagnosis not present

## 2013-05-23 ENCOUNTER — Ambulatory Visit (INDEPENDENT_AMBULATORY_CARE_PROVIDER_SITE_OTHER): Payer: Medicare Other | Admitting: Internal Medicine

## 2013-05-23 ENCOUNTER — Encounter (INDEPENDENT_AMBULATORY_CARE_PROVIDER_SITE_OTHER): Payer: Self-pay

## 2013-05-23 DIAGNOSIS — I4891 Unspecified atrial fibrillation: Secondary | ICD-10-CM | POA: Diagnosis not present

## 2013-05-23 LAB — PULMONARY FUNCTION TEST
DL/VA % pred: 84 %
DL/VA: 4.04 ml/min/mmHg/L
DLCO unc % pred: 71 %
DLCO unc: 17.41 ml/min/mmHg
FEF 25-75 Post: 3.87 L/sec
FEF 25-75 Pre: 3.18 L/sec
FEF2575-%Change-Post: 21 %
FEF2575-%Pred-Post: 235 %
FEF2575-%Pred-Pre: 193 %
FEV1-%Change-Post: 9 %
FEV1-%Pred-Post: 119 %
FEV1-%Pred-Pre: 109 %
FEV1-Post: 2.51 L
FEV1-Pre: 2.29 L
FEV1FVC-%Change-Post: 5 %
FEV1FVC-%Pred-Pre: 114 %
FEV6-%Change-Post: 4 %
FEV6-%Pred-Post: 104 %
FEV6-%Pred-Pre: 100 %
FEV6-Post: 2.79 L
FEV6-Pre: 2.68 L
FEV6FVC-%Pred-Post: 105 %
FEV6FVC-%Pred-Pre: 105 %
FVC-%Change-Post: 4 %
FVC-%Pred-Post: 99 %
FVC-%Pred-Pre: 95 %
FVC-Post: 2.79 L
FVC-Pre: 2.68 L
Post FEV1/FVC ratio: 90 %
Post FEV6/FVC ratio: 100 %
Pre FEV1/FVC ratio: 86 %
Pre FEV6/FVC Ratio: 100 %
RV % pred: 102 %
RV: 2.35 L
TLC % pred: 106 %
TLC: 5.39 L

## 2013-05-23 NOTE — Progress Notes (Signed)
PFT done today. 

## 2013-05-28 ENCOUNTER — Ambulatory Visit (INDEPENDENT_AMBULATORY_CARE_PROVIDER_SITE_OTHER): Payer: Medicare Other | Admitting: Pharmacist

## 2013-05-28 DIAGNOSIS — I4891 Unspecified atrial fibrillation: Secondary | ICD-10-CM

## 2013-05-28 LAB — POCT INR: INR: 4.4

## 2013-05-31 ENCOUNTER — Telehealth: Payer: Self-pay | Admitting: Cardiology

## 2013-05-31 NOTE — Telephone Encounter (Signed)
New message  ° ° °Calling from test results  °

## 2013-05-31 NOTE — Telephone Encounter (Signed)
Pt aware of pfts & that Dr. Melvyn Novas will be reviewing pft & [facility]CO Reassurance given Horton Chin RN

## 2013-06-05 ENCOUNTER — Ambulatory Visit (INDEPENDENT_AMBULATORY_CARE_PROVIDER_SITE_OTHER): Payer: Medicare Other | Admitting: Pharmacist

## 2013-06-05 DIAGNOSIS — I4891 Unspecified atrial fibrillation: Secondary | ICD-10-CM | POA: Diagnosis not present

## 2013-06-05 LAB — POCT INR: INR: 2.7

## 2013-06-26 ENCOUNTER — Ambulatory Visit (INDEPENDENT_AMBULATORY_CARE_PROVIDER_SITE_OTHER): Payer: Medicare Other | Admitting: *Deleted

## 2013-06-26 DIAGNOSIS — I4891 Unspecified atrial fibrillation: Secondary | ICD-10-CM

## 2013-06-26 DIAGNOSIS — Z5181 Encounter for therapeutic drug level monitoring: Secondary | ICD-10-CM | POA: Insufficient documentation

## 2013-06-26 LAB — POCT INR: INR: 3.2

## 2013-07-03 DIAGNOSIS — Z1231 Encounter for screening mammogram for malignant neoplasm of breast: Secondary | ICD-10-CM | POA: Diagnosis not present

## 2013-07-03 DIAGNOSIS — Z803 Family history of malignant neoplasm of breast: Secondary | ICD-10-CM | POA: Diagnosis not present

## 2013-07-10 ENCOUNTER — Ambulatory Visit (INDEPENDENT_AMBULATORY_CARE_PROVIDER_SITE_OTHER): Payer: Medicare Other | Admitting: Pharmacist

## 2013-07-10 DIAGNOSIS — I4891 Unspecified atrial fibrillation: Secondary | ICD-10-CM

## 2013-07-10 DIAGNOSIS — Z5181 Encounter for therapeutic drug level monitoring: Secondary | ICD-10-CM

## 2013-07-10 LAB — POCT INR: INR: 3.3

## 2013-07-22 ENCOUNTER — Encounter: Payer: Self-pay | Admitting: Internal Medicine

## 2013-07-24 ENCOUNTER — Ambulatory Visit (INDEPENDENT_AMBULATORY_CARE_PROVIDER_SITE_OTHER): Payer: Medicare Other | Admitting: Pharmacist

## 2013-07-24 DIAGNOSIS — I4891 Unspecified atrial fibrillation: Secondary | ICD-10-CM

## 2013-07-24 DIAGNOSIS — Z5181 Encounter for therapeutic drug level monitoring: Secondary | ICD-10-CM

## 2013-07-24 LAB — POCT INR: INR: 3.4

## 2013-07-31 ENCOUNTER — Other Ambulatory Visit: Payer: Self-pay

## 2013-07-31 MED ORDER — FLUTICASONE PROPIONATE 50 MCG/ACT NA SUSP: NASAL | Status: DC

## 2013-08-01 ENCOUNTER — Other Ambulatory Visit: Payer: Self-pay | Admitting: Cardiology

## 2013-08-02 ENCOUNTER — Ambulatory Visit (INDEPENDENT_AMBULATORY_CARE_PROVIDER_SITE_OTHER): Payer: Medicare Other | Admitting: Pharmacist

## 2013-08-02 DIAGNOSIS — I4891 Unspecified atrial fibrillation: Secondary | ICD-10-CM

## 2013-08-02 DIAGNOSIS — Z5181 Encounter for therapeutic drug level monitoring: Secondary | ICD-10-CM

## 2013-08-02 LAB — POCT INR: INR: 2.1

## 2013-08-13 DIAGNOSIS — N390 Urinary tract infection, site not specified: Secondary | ICD-10-CM | POA: Diagnosis not present

## 2013-08-28 ENCOUNTER — Other Ambulatory Visit: Payer: Self-pay | Admitting: Cardiology

## 2013-08-30 ENCOUNTER — Ambulatory Visit (INDEPENDENT_AMBULATORY_CARE_PROVIDER_SITE_OTHER): Payer: Medicare Other | Admitting: Pharmacist

## 2013-08-30 DIAGNOSIS — I4891 Unspecified atrial fibrillation: Secondary | ICD-10-CM | POA: Diagnosis not present

## 2013-08-30 DIAGNOSIS — Z5181 Encounter for therapeutic drug level monitoring: Secondary | ICD-10-CM

## 2013-08-30 LAB — POCT INR: INR: 2.5

## 2013-09-23 ENCOUNTER — Other Ambulatory Visit: Payer: Self-pay | Admitting: Cardiology

## 2013-09-27 ENCOUNTER — Ambulatory Visit (INDEPENDENT_AMBULATORY_CARE_PROVIDER_SITE_OTHER): Payer: Medicare Other | Admitting: *Deleted

## 2013-09-27 ENCOUNTER — Encounter: Payer: Self-pay | Admitting: Cardiology

## 2013-09-27 ENCOUNTER — Ambulatory Visit (INDEPENDENT_AMBULATORY_CARE_PROVIDER_SITE_OTHER): Payer: Medicare Other | Admitting: Cardiology

## 2013-09-27 ENCOUNTER — Other Ambulatory Visit: Payer: Self-pay | Admitting: Cardiology

## 2013-09-27 VITALS — BP 108/69 | HR 60 | Ht 65.0 in | Wt 138.0 lb

## 2013-09-27 DIAGNOSIS — I2510 Atherosclerotic heart disease of native coronary artery without angina pectoris: Secondary | ICD-10-CM | POA: Diagnosis not present

## 2013-09-27 DIAGNOSIS — Z7901 Long term (current) use of anticoagulants: Secondary | ICD-10-CM | POA: Diagnosis not present

## 2013-09-27 DIAGNOSIS — I10 Essential (primary) hypertension: Secondary | ICD-10-CM

## 2013-09-27 DIAGNOSIS — I428 Other cardiomyopathies: Secondary | ICD-10-CM | POA: Diagnosis not present

## 2013-09-27 DIAGNOSIS — Z5181 Encounter for therapeutic drug level monitoring: Secondary | ICD-10-CM

## 2013-09-27 DIAGNOSIS — I4891 Unspecified atrial fibrillation: Secondary | ICD-10-CM

## 2013-09-27 LAB — POCT INR: INR: 3

## 2013-09-27 MED ORDER — METOPROLOL SUCCINATE ER 25 MG PO TB24: ORAL_TABLET | ORAL | Status: DC

## 2013-09-27 MED ORDER — AMIODARONE HCL 200 MG PO TABS: 200.0000 mg | ORAL_TABLET | Freq: Every day | ORAL | Status: DC

## 2013-09-27 NOTE — Progress Notes (Signed)
[address] [place], [place]  [address] Phone: [phone] Fax:  [phone]  Date:  09/27/2013   ID:  Cynthia Frank, Cynthia Frank [birth year], MRN [id number]  PCP:  Adella Hare, MD  Cardiologist:  Fransico Him, MD     History of Present Illness: Cynthia Frank is a 78 y.o. female with a history of Adriamycin induced nonischemic DCM, HTN , PAF and PVC's who presents today for followup. She is doing well. She denies any chest pain, SOB, DOE, LE edema, dizziness, palpitations or syncope.    Wt Readings from Last 3 Encounters:  09/27/13 138 lb (62.596 kg)  02/27/13 138 lb (62.596 kg)  12/11/12 139 lb (63.05 kg)     Past Medical History  Diagnosis Date  . Hx: UTI (urinary tract infection)   . Nonischemic cardiomyopathy     last EF assessment 40% by MUGA  . History of ovarian cancer 1985  . Partial bowel obstruction   . Internal hemorrhoid   . Colon cancer 03/2006    T3, N0  . Colon polyps 12/07/2010  . Hypertension   . Atrial fibrillation   . PVC (premature ventricular contraction)   . Coronary artery disease     nonobstructive with 30% D1  . Hyperlipidemia     Current Outpatient Prescriptions  Medication Sig Dispense Refill  . amiodarone (PACERONE) 200 MG tablet Take 1 tablet (200 mg total) by mouth daily.  90 tablet  1  . atorvastatin (LIPITOR) 20 MG tablet Take 1 tablet (20 mg total) by mouth daily.  90 tablet  3  . Coenzyme Q10 (COQ10) 100 MG CAPS Take 1 capsule by mouth daily.       . fluticasone (FLONASE) 50 MCG/ACT nasal spray INSTILL 1 SPRAY IN EACH NOSTRIL DAILY  16 g  0  . furosemide (LASIX) 20 MG tablet TAKE 1 TABLET BY MOUTH DAILY  90 tablet  1  . metoprolol succinate (TOPROL-XL) 25 MG 24 hr tablet TAKE 1 TABLET BY MOUTH EVERY DAY  30 tablet  0  . MULTIPLE VITAMINS PO Take by mouth daily.        Marland Kitchen olmesartan (BENICAR) 20 MG tablet One half daily      . potassium chloride SA (K-DUR,KLOR-CON) 20 MEQ tablet TAKE 2 TABLETS BY MOUTH IN THE MORNING  AND 1 TABLET IN THE EVENING DAILY  270 tablet  1  . traMADol (ULTRAM) 50 MG tablet Take 2 by mouth every 4 hours as needed for cough  40 tablet  0  . tretinoin (RETIN-A) 0.1 % cream Apply topically at bedtime.        Marland Kitchen warfarin (COUMADIN) 2.5 MG tablet 1/2 tablet on Tuesday, Thursday, Saturday, Sunday and 1 tablet on Monday, Wednesday, Friday or as directed by coumadin clinic  90 tablet  1  . zolpidem (AMBIEN) 10 MG tablet Take 0.5 tablets (5 mg total) by mouth at bedtime as needed.  30 tablet  0   No current facility-administered medications for this visit.    Allergies:    Allergies  Allergen Reactions  . Clindamycin/Lincomycin     itching  . Sulfonamide Derivatives     REACTION: Itching/Swelling    Social History:  The patient  reports that she quit smoking about 35 years ago. Her smoking use included Cigarettes. She has a 10 pack-year smoking history. She does not have any smokeless tobacco history on file. She reports that she drinks about 8.4 ounces of alcohol per week. She reports that  she does not use illicit drugs.   Family History:  The patient's family history includes Heart attack in her father; Heart disease in her father; Prostate cancer in her father; Uterine cancer in her mother. There is no history of Colon cancer.   ROS:  Please see the history of present illness.      All other systems reviewed and negative.   PHYSICAL EXAM: VS:  BP 108/69  Pulse 60  Ht 5\' 5"  (1.651 m)  Wt 138 lb (62.596 kg)  BMI 22.96 kg/m2 Well nourished, well developed, in no acute distress HEENT: normal Neck: no JVD Cardiac:  normal S1, S2; RRR; no murmur Lungs:  clear to auscultation bilaterally, no wheezing, rhonchi or rales Abd: soft, nontender, no hepatomegaly Ext: no edema Skin: warm and dry Neuro:  CNs 2-12 intact, no focal abnormalities noted  ASSESSMENT AND PLAN:        1.  Nonischemic DCM secondary to Adriamycin  - continue beta blocker/ARB/Lasix   2. HTN - controlled -  continue metoprolol/Benicar  3. PVC's - continue Metoprolol  4. PAF - no reoccurence - continue Amiodarone/metoprolol/warfarin   5. Nonobstructive ASCAD of the diagonal with no angina  5. Dyslipidemia - LDL at goal  - continue atorvastatin   Followup with me in 6 months    Signed, Fransico Him, MD 09/27/2013 9:38 AM

## 2013-09-27 NOTE — Patient Instructions (Signed)
Your physician recommends that you continue on your current medications as directed. Please refer to the Current Medication list given to you today.  Your physician wants you to follow-up in: 6 month ov You will receive a reminder letter in the mail two months in advance. If you don't receive a letter, please call our office to schedule the follow-up appointment.  

## 2013-10-25 ENCOUNTER — Ambulatory Visit (INDEPENDENT_AMBULATORY_CARE_PROVIDER_SITE_OTHER): Payer: Medicare Other

## 2013-10-25 DIAGNOSIS — I4891 Unspecified atrial fibrillation: Secondary | ICD-10-CM | POA: Diagnosis not present

## 2013-10-25 DIAGNOSIS — Z5181 Encounter for therapeutic drug level monitoring: Secondary | ICD-10-CM | POA: Diagnosis not present

## 2013-10-25 LAB — POCT INR: INR: 2.3

## 2013-11-11 ENCOUNTER — Other Ambulatory Visit: Payer: Self-pay | Admitting: Cardiology

## 2013-11-13 ENCOUNTER — Other Ambulatory Visit: Payer: Self-pay

## 2013-12-06 ENCOUNTER — Ambulatory Visit (INDEPENDENT_AMBULATORY_CARE_PROVIDER_SITE_OTHER): Payer: Medicare Other | Admitting: Pharmacist

## 2013-12-06 DIAGNOSIS — I4891 Unspecified atrial fibrillation: Secondary | ICD-10-CM

## 2013-12-06 DIAGNOSIS — Z5181 Encounter for therapeutic drug level monitoring: Secondary | ICD-10-CM

## 2013-12-06 LAB — POCT INR: INR: 2.7

## 2013-12-13 DIAGNOSIS — L821 Other seborrheic keratosis: Secondary | ICD-10-CM | POA: Diagnosis not present

## 2013-12-13 DIAGNOSIS — D2370 Other benign neoplasm of skin of unspecified lower limb, including hip: Secondary | ICD-10-CM | POA: Diagnosis not present

## 2013-12-13 DIAGNOSIS — L570 Actinic keratosis: Secondary | ICD-10-CM | POA: Diagnosis not present

## 2013-12-13 DIAGNOSIS — D239 Other benign neoplasm of skin, unspecified: Secondary | ICD-10-CM | POA: Diagnosis not present

## 2013-12-13 DIAGNOSIS — L819 Disorder of pigmentation, unspecified: Secondary | ICD-10-CM | POA: Diagnosis not present

## 2013-12-13 DIAGNOSIS — D230 Other benign neoplasm of skin of lip: Secondary | ICD-10-CM | POA: Diagnosis not present

## 2013-12-13 DIAGNOSIS — D1801 Hemangioma of skin and subcutaneous tissue: Secondary | ICD-10-CM | POA: Diagnosis not present

## 2013-12-13 DIAGNOSIS — L723 Sebaceous cyst: Secondary | ICD-10-CM | POA: Diagnosis not present

## 2014-01-17 ENCOUNTER — Ambulatory Visit (INDEPENDENT_AMBULATORY_CARE_PROVIDER_SITE_OTHER): Payer: Medicare Other | Admitting: *Deleted

## 2014-01-17 DIAGNOSIS — I4891 Unspecified atrial fibrillation: Secondary | ICD-10-CM

## 2014-01-17 DIAGNOSIS — Z23 Encounter for immunization: Secondary | ICD-10-CM | POA: Diagnosis not present

## 2014-01-17 DIAGNOSIS — Z5181 Encounter for therapeutic drug level monitoring: Secondary | ICD-10-CM

## 2014-01-17 LAB — POCT INR: INR: 2.6

## 2014-01-23 ENCOUNTER — Encounter: Payer: Self-pay | Admitting: Cardiology

## 2014-02-10 ENCOUNTER — Other Ambulatory Visit: Payer: Self-pay | Admitting: Cardiology

## 2014-02-10 DIAGNOSIS — T8484XD Pain due to internal orthopedic prosthetic devices, implants and grafts, subsequent encounter: Secondary | ICD-10-CM | POA: Diagnosis not present

## 2014-02-19 ENCOUNTER — Encounter: Payer: Self-pay | Admitting: Cardiology

## 2014-02-21 ENCOUNTER — Ambulatory Visit (INDEPENDENT_AMBULATORY_CARE_PROVIDER_SITE_OTHER): Payer: Medicare Other | Admitting: Pharmacist

## 2014-02-21 DIAGNOSIS — Z5181 Encounter for therapeutic drug level monitoring: Secondary | ICD-10-CM | POA: Diagnosis not present

## 2014-02-21 DIAGNOSIS — I4891 Unspecified atrial fibrillation: Secondary | ICD-10-CM | POA: Diagnosis not present

## 2014-02-21 LAB — POCT INR: INR: 2.3

## 2014-03-18 ENCOUNTER — Encounter: Payer: Self-pay | Admitting: Internal Medicine

## 2014-03-18 ENCOUNTER — Ambulatory Visit (INDEPENDENT_AMBULATORY_CARE_PROVIDER_SITE_OTHER): Payer: Medicare Other | Admitting: Internal Medicine

## 2014-03-18 ENCOUNTER — Other Ambulatory Visit: Payer: Medicare Other

## 2014-03-18 ENCOUNTER — Other Ambulatory Visit (INDEPENDENT_AMBULATORY_CARE_PROVIDER_SITE_OTHER): Payer: Medicare Other

## 2014-03-18 VITALS — BP 124/84 | HR 64 | Temp 97.8°F | Resp 16 | Ht 65.0 in | Wt 140.0 lb

## 2014-03-18 DIAGNOSIS — E785 Hyperlipidemia, unspecified: Secondary | ICD-10-CM

## 2014-03-18 DIAGNOSIS — R7989 Other specified abnormal findings of blood chemistry: Secondary | ICD-10-CM | POA: Diagnosis not present

## 2014-03-18 DIAGNOSIS — I4891 Unspecified atrial fibrillation: Secondary | ICD-10-CM | POA: Diagnosis not present

## 2014-03-18 DIAGNOSIS — I10 Essential (primary) hypertension: Secondary | ICD-10-CM | POA: Diagnosis not present

## 2014-03-18 DIAGNOSIS — Z0000 Encounter for general adult medical examination without abnormal findings: Secondary | ICD-10-CM | POA: Diagnosis not present

## 2014-03-18 DIAGNOSIS — I2510 Atherosclerotic heart disease of native coronary artery without angina pectoris: Secondary | ICD-10-CM | POA: Diagnosis not present

## 2014-03-18 LAB — COMPREHENSIVE METABOLIC PANEL
ALT: 54 U/L — ABNORMAL HIGH (ref 0–35)
AST: 55 U/L — ABNORMAL HIGH (ref 0–37)
Albumin: 4.3 g/dL (ref 3.5–5.2)
Alkaline Phosphatase: 68 U/L (ref 39–117)
BUN: 14 mg/dL (ref 6–23)
CO2: 28 mEq/L (ref 19–32)
Calcium: 9.3 mg/dL (ref 8.4–10.5)
Chloride: 99 mEq/L (ref 96–112)
Creatinine, Ser: 1 mg/dL (ref 0.4–1.2)
GFR: 60.03 mL/min (ref >60.00)
Glucose, Bld: 94 mg/dL (ref 70–99)
Potassium: 4.3 mEq/L (ref 3.5–5.1)
Sodium: 138 mEq/L (ref 135–145)
Total Bilirubin: 1.1 mg/dL (ref 0.2–1.2)
Total Protein: 7.6 g/dL (ref 6.0–8.3)

## 2014-03-18 LAB — LIPID PANEL
Cholesterol: 193 mg/dL (ref 0–200)
HDL: 95.2 mg/dL (ref >39.00)
LDL Cholesterol: 84 mg/dL (ref 0–99)
NonHDL: 97.8
Total CHOL/HDL Ratio: 2
Triglycerides: 68 mg/dL (ref 0.0–149.0)
VLDL: 13.6 mg/dL (ref 0.0–40.0)

## 2014-03-18 MED ORDER — BUSPIRONE HCL 5 MG PO TABS: 5.0000 mg | ORAL_TABLET | Freq: Every day | ORAL | Status: DC | PRN

## 2014-03-18 MED ORDER — ESTROGENS, CONJUGATED 0.625 MG/GM VA CREA: 1.0000 | TOPICAL_CREAM | Freq: Every day | VAGINAL | Status: DC

## 2014-03-18 MED ORDER — DICLOFENAC SODIUM 1 % TD GEL: 2.0000 g | Freq: Four times a day (QID) | TRANSDERMAL | Status: DC

## 2014-03-18 NOTE — Progress Notes (Signed)
   Subjective:    Patient ID: Cynthia Frank, female    DOB: 1935/06/27, 78 y.o.   MRN: [id number]  HPI The patient is a 78 year old female comes in today to establish care. She has past medical history of atrial fibrillation, hot flashes, hypertension, hyperlipidemia. She is doing very well overall and has no current complaints. She is having some anxiety lately due to some family tensions. Her daughter has just been diagnosed with breast cancer and this makes her exceptionally upset. She is currently undergoing treatment and then will have additional mastectomy in the spring.  Review of Systems  Constitutional: Negative for fever, activity change, appetite change, fatigue and unexpected weight change.  HENT: Negative.   Eyes: Negative.   Respiratory: Negative for cough, chest tightness, shortness of breath and wheezing.   Cardiovascular: Negative for chest pain, palpitations and leg swelling.  Gastrointestinal: Negative for nausea, abdominal pain, diarrhea, constipation and abdominal distention.  Musculoskeletal: Negative.   Skin: Negative.   Neurological: Negative.   Psychiatric/Behavioral: The patient is nervous/anxious.       Objective:   Physical Exam  Constitutional: She is oriented to person, place, and time. She appears well-developed and well-nourished.  HENT:  Head: Normocephalic and atraumatic.  Eyes: EOM are normal.  Neck: Normal range of motion. Neck supple.  Cardiovascular: Normal rate and regular rhythm.   Pulmonary/Chest: Effort normal and breath sounds normal. No respiratory distress. She has no wheezes. She has no rales.  Abdominal: Soft. Bowel sounds are normal. She exhibits no distension. There is no tenderness. There is no rebound.  Musculoskeletal: Normal range of motion.  Neurological: She is alert and oriented to person, place, and time. Coordination normal.  Skin: Skin is warm and dry.   Filed Vitals:   03/18/14 1016  BP: 124/84  Pulse: 64  Temp:  97.8 F (36.6 C)  TempSrc: Oral  Resp: 16  Height: 5\' 5"  (1.651 m)  Weight: 140 lb (63.504 kg)  SpO2: 97%      Assessment & Plan:

## 2014-03-18 NOTE — Patient Instructions (Signed)
We will send in the voltaren gel to see if we can get that approved.  We will try medication called BuSpar. You can take 1 pill daily as needed for anxiety.   We will see back next year. If you have any new problems or questions before then please feel free to call our office.  Health Maintenance Adopting a healthy lifestyle and getting preventive care can go a long way to promote health and wellness. Talk with your health care provider about what schedule of regular examinations is right for you. This is a good chance for you to check in with your provider about disease prevention and staying healthy. In between checkups, there are plenty of things you can do on your own. Experts have done a lot of research about which lifestyle changes and preventive measures are most likely to keep you healthy. Ask your health care provider for more information. WEIGHT AND DIET  Eat a healthy diet  Be sure to include plenty of vegetables, fruits, low-fat dairy products, and lean protein.  Do not eat a lot of foods high in solid fats, added sugars, or salt.  Get regular exercise. This is one of the most important things you can do for your health.  Most adults should exercise for at least 150 minutes each week. The exercise should increase your heart rate and make you sweat (moderate-intensity exercise).  Most adults should also do strengthening exercises at least twice a week. This is in addition to the moderate-intensity exercise.  Maintain a healthy weight  Body mass index (BMI) is a measurement that can be used to identify possible weight problems. It estimates body fat based on height and weight. Your health care provider can help determine your BMI and help you achieve or maintain a healthy weight.  For females 59 years of age and older:   A BMI below 18.5 is considered underweight.  A BMI of 18.5 to 24.9 is normal.  A BMI of 25 to 29.9 is considered overweight.  A BMI of 30 and above is  considered obese.  Watch levels of cholesterol and blood lipids  You should start having your blood tested for lipids and cholesterol at 76 years of age, then have this test every 5 years.  You may need to have your cholesterol levels checked more often if:  Your lipid or cholesterol levels are high.  You are older than 76 years of age.  You are at high risk for heart disease.  CANCER SCREENING   Lung Cancer  Lung cancer screening is recommended for adults 52-31 years old who are at high risk for lung cancer because of a history of smoking.  A yearly low-dose CT scan of the lungs is recommended for people who:  Currently smoke.  Have quit within the past 15 years.  Have at least a 30-pack-year history of smoking. A pack year is smoking an average of one pack of cigarettes a day for 1 year.  Yearly screening should continue until it has been 15 years since you quit.  Yearly screening should stop if you develop a health problem that would prevent you from having lung cancer treatment.  Breast Cancer  Practice breast self-awareness. This means understanding how your breasts normally appear and feel.  It also means doing regular breast self-exams. Let your health care provider know about any changes, no matter how small.  If you are in your 20s or 30s, you should have a clinical breast exam (CBE) by  a health care provider every 1-3 years as part of a regular health exam.  If you are 40 or older, have a CBE every year. Also consider having a breast X-ray (mammogram) every year.  If you have a family history of breast cancer, talk to your health care provider about genetic screening.  If you are at high risk for breast cancer, talk to your health care provider about having an MRI and a mammogram every year.  Breast cancer gene (BRCA) assessment is recommended for women who have family members with BRCA-related cancers. BRCA-related cancers  include:  Breast.  Ovarian.  Tubal.  Peritoneal cancers.  Results of the assessment will determine the need for genetic counseling and BRCA1 and BRCA2 testing. Cervical Cancer Routine pelvic examinations to screen for cervical cancer are no longer recommended for nonpregnant women who are considered low risk for cancer of the pelvic organs (ovaries, uterus, and vagina) and who do not have symptoms. A pelvic examination may be necessary if you have symptoms including those associated with pelvic infections. Ask your health care provider if a screening pelvic exam is right for you.   The Pap test is the screening test for cervical cancer for women who are considered at risk.  If you had a hysterectomy for a problem that was not cancer or a condition that could lead to cancer, then you no longer need Pap tests.  If you are older than 65 years, and you have had normal Pap tests for the past 10 years, you no longer need to have Pap tests.  If you have had past treatment for cervical cancer or a condition that could lead to cancer, you need Pap tests and screening for cancer for at least 20 years after your treatment.  If you no longer get a Pap test, assess your risk factors if they change (such as having a new sexual partner). This can affect whether you should start being screened again.  Some women have medical problems that increase their chance of getting cervical cancer. If this is the case for you, your health care provider may recommend more frequent screening and Pap tests.  The human papillomavirus (HPV) test is another test that may be used for cervical cancer screening. The HPV test looks for the virus that can cause cell changes in the cervix. The cells collected during the Pap test can be tested for HPV.  The HPV test can be used to screen women 30 years of age and older. Getting tested for HPV can extend the interval between normal Pap tests from three to five years.  An HPV  test also should be used to screen women of any age who have unclear Pap test results.  After 76 years of age, women should have HPV testing as often as Pap tests.  Colorectal Cancer  This type of cancer can be detected and often prevented.  Routine colorectal cancer screening usually begins at 76 years of age and continues through 75 years of age.  Your health care provider may recommend screening at an earlier age if you have risk factors for colon cancer.  Your health care provider may also recommend using home test kits to check for hidden blood in the stool.  A small camera at the end of a tube can be used to examine your colon directly (sigmoidoscopy or colonoscopy). This is done to check for the earliest forms of colorectal cancer.  Routine screening usually begins at age 50.  Direct examination   of the colon should be repeated every 5-10 years through 75 years of age. However, you may need to be screened more often if early forms of precancerous polyps or small growths are found. Skin Cancer  Check your skin from head to toe regularly.  Tell your health care provider about any new moles or changes in moles, especially if there is a change in a mole's shape or color.  Also tell your health care provider if you have a mole that is larger than the size of a pencil eraser.  Always use sunscreen. Apply sunscreen liberally and repeatedly throughout the day.  Protect yourself by wearing long sleeves, pants, a wide-brimmed hat, and sunglasses whenever you are outside. HEART DISEASE, DIABETES, AND HIGH BLOOD PRESSURE   Have your blood pressure checked at least every 1-2 years. High blood pressure causes heart disease and increases the risk of stroke.  If you are between 55 years and 79 years old, ask your health care provider if you should take aspirin to prevent strokes.  Have regular diabetes screenings. This involves taking a blood sample to check your fasting blood sugar  level.  If you are at a normal weight and have a low risk for diabetes, have this test once every three years after 76 years of age.  If you are overweight and have a high risk for diabetes, consider being tested at a younger age or more often. PREVENTING INFECTION  Hepatitis B  If you have a higher risk for hepatitis B, you should be screened for this virus. You are considered at high risk for hepatitis B if:  You were born in a country where hepatitis B is common. Ask your health care provider which countries are considered high risk.  Your parents were born in a high-risk country, and you have not been immunized against hepatitis B (hepatitis B vaccine).  You have HIV or AIDS.  You use needles to inject street drugs.  You live with someone who has hepatitis B.  You have had sex with someone who has hepatitis B.  You get hemodialysis treatment.  You take certain medicines for conditions, including cancer, organ transplantation, and autoimmune conditions. Hepatitis C  Blood testing is recommended for:  Everyone born from 1945 through 1965.  Anyone with known risk factors for hepatitis C. Sexually transmitted infections (STIs)  You should be screened for sexually transmitted infections (STIs) including gonorrhea and chlamydia if:  You are sexually active and are younger than 76 years of age.  You are older than 76 years of age and your health care provider tells you that you are at risk for this type of infection.  Your sexual activity has changed since you were last screened and you are at an increased risk for chlamydia or gonorrhea. Ask your health care provider if you are at risk.  If you do not have HIV, but are at risk, it may be recommended that you take a prescription medicine daily to prevent HIV infection. This is called pre-exposure prophylaxis (PrEP). You are considered at risk if:  You are sexually active and do not regularly use condoms or know the HIV status  of your partner(s).  You take drugs by injection.  You are sexually active with a partner who has HIV. Talk with your health care provider about whether you are at high risk of being infected with HIV. If you choose to begin PrEP, you should first be tested for HIV. You should then be tested every   3 months for as long as you are taking PrEP.  PREGNANCY   If you are premenopausal and you may become pregnant, ask your health care provider about preconception counseling.  If you may become pregnant, take 400 to 800 micrograms (mcg) of folic acid every day.  If you want to prevent pregnancy, talk to your health care provider about birth control (contraception). OSTEOPOROSIS AND MENOPAUSE   Osteoporosis is a disease in which the bones lose minerals and strength with aging. This can result in serious bone fractures. Your risk for osteoporosis can be identified using a bone density scan.  If you are 65 years of age or older, or if you are at risk for osteoporosis and fractures, ask your health care provider if you should be screened.  Ask your health care provider whether you should take a calcium or vitamin D supplement to lower your risk for osteoporosis.  Menopause may have certain physical symptoms and risks.  Hormone replacement therapy may reduce some of these symptoms and risks. Talk to your health care provider about whether hormone replacement therapy is right for you.  HOME CARE INSTRUCTIONS   Schedule regular health, dental, and eye exams.  Stay current with your immunizations.   Do not use any tobacco products including cigarettes, chewing tobacco, or electronic cigarettes.  If you are pregnant, do not drink alcohol.  If you are breastfeeding, limit how much and how often you drink alcohol.  Limit alcohol intake to no more than 1 drink per day for nonpregnant women. One drink equals 12 ounces of beer, 5 ounces of wine, or 1 ounces of hard liquor.  Do not use street  drugs.  Do not share needles.  Ask your health care provider for help if you need support or information about quitting drugs.  Tell your health care provider if you often feel depressed.  Tell your health care provider if you have ever been abused or do not feel safe at home. Document Released: 10/18/2010 Document Revised: 08/19/2013 Document Reviewed: 03/06/2013 [employer] Patient Information 2015 [employer]. This information is not intended to replace advice given to you by your health care provider. Make sure you discuss any questions you have with your health care provider.  

## 2014-03-19 NOTE — Assessment & Plan Note (Signed)
Check lipid panel and adjust atorvastatin if needed. Previous elevation in LFT concern from cardiology for hepatitis. It appears her previous PCP ordered hepatitis screening however this was never completed. Will do that today.

## 2014-03-19 NOTE — Assessment & Plan Note (Signed)
Takes amiodarone, metoprolol. She is on anticoagulation with warfarin. Appeared to be in sinus rhythm on exam today.

## 2014-03-19 NOTE — Assessment & Plan Note (Signed)
BP well controlled on Benicar, Lasix, metoprolol.

## 2014-03-21 ENCOUNTER — Telehealth: Payer: Self-pay | Admitting: Internal Medicine

## 2014-03-21 ENCOUNTER — Other Ambulatory Visit: Payer: Self-pay | Admitting: Geriatric Medicine

## 2014-03-21 MED ORDER — ZOLPIDEM TARTRATE 5 MG PO TABS: 5.0000 mg | ORAL_TABLET | Freq: Every evening | ORAL | Status: DC | PRN

## 2014-03-21 MED ORDER — TRAMADOL HCL 50 MG PO TABS: 50.0000 mg | ORAL_TABLET | ORAL | Status: DC | PRN

## 2014-03-21 NOTE — Telephone Encounter (Signed)
She also states she thought Ambien would be sent in, pls advise.

## 2014-03-21 NOTE — Telephone Encounter (Signed)
Pt states she was under the assumption that MD was going to also refill Tramadol however pharmacy does not have a script for this, she is calling to inquire.

## 2014-03-22 ENCOUNTER — Other Ambulatory Visit: Payer: Self-pay | Admitting: Cardiology

## 2014-03-22 NOTE — Telephone Encounter (Signed)
Rx was sent to pharmacy electronically. 

## 2014-03-24 ENCOUNTER — Other Ambulatory Visit: Payer: Self-pay | Admitting: Cardiology

## 2014-03-24 DIAGNOSIS — H9193 Unspecified hearing loss, bilateral: Secondary | ICD-10-CM | POA: Diagnosis not present

## 2014-03-24 DIAGNOSIS — J310 Chronic rhinitis: Secondary | ICD-10-CM | POA: Diagnosis not present

## 2014-03-24 DIAGNOSIS — H9113 Presbycusis, bilateral: Secondary | ICD-10-CM | POA: Diagnosis not present

## 2014-03-24 DIAGNOSIS — J300 Vasomotor rhinitis: Secondary | ICD-10-CM | POA: Diagnosis not present

## 2014-03-24 DIAGNOSIS — R490 Dysphonia: Secondary | ICD-10-CM | POA: Diagnosis not present

## 2014-04-04 ENCOUNTER — Ambulatory Visit (INDEPENDENT_AMBULATORY_CARE_PROVIDER_SITE_OTHER): Payer: Medicare Other

## 2014-04-04 ENCOUNTER — Encounter: Payer: Self-pay | Admitting: Cardiology

## 2014-04-04 ENCOUNTER — Ambulatory Visit (INDEPENDENT_AMBULATORY_CARE_PROVIDER_SITE_OTHER): Payer: Medicare Other | Admitting: Cardiology

## 2014-04-04 VITALS — BP 118/78 | HR 63 | Ht 65.0 in | Wt 142.8 lb

## 2014-04-04 DIAGNOSIS — I2510 Atherosclerotic heart disease of native coronary artery without angina pectoris: Secondary | ICD-10-CM

## 2014-04-04 DIAGNOSIS — I493 Ventricular premature depolarization: Secondary | ICD-10-CM

## 2014-04-04 DIAGNOSIS — I4891 Unspecified atrial fibrillation: Secondary | ICD-10-CM | POA: Diagnosis not present

## 2014-04-04 DIAGNOSIS — I2583 Coronary atherosclerosis due to lipid rich plaque: Secondary | ICD-10-CM

## 2014-04-04 DIAGNOSIS — I10 Essential (primary) hypertension: Secondary | ICD-10-CM | POA: Diagnosis not present

## 2014-04-04 DIAGNOSIS — I429 Cardiomyopathy, unspecified: Secondary | ICD-10-CM

## 2014-04-04 DIAGNOSIS — I480 Paroxysmal atrial fibrillation: Secondary | ICD-10-CM

## 2014-04-04 DIAGNOSIS — I428 Other cardiomyopathies: Secondary | ICD-10-CM

## 2014-04-04 DIAGNOSIS — E785 Hyperlipidemia, unspecified: Secondary | ICD-10-CM

## 2014-04-04 LAB — POCT INR: INR: 2.5

## 2014-04-04 MED ORDER — ATORVASTATIN CALCIUM 20 MG PO TABS: 40.0000 mg | ORAL_TABLET | Freq: Every day | ORAL | Status: DC

## 2014-04-04 NOTE — Progress Notes (Signed)
[address] [place], [place]  [address] Phone: [phone] Fax:  [phone]  Date:  04/04/2014   ID:  Shelisa, Fern June 02, 1935, MRN [id number]  PCP:  Olga Millers, MD  Cardiologist:  Fransico Him, MD    History of Present Illness: Cynthia Frank is a 78 y.o. female with a history of Adriamycin induced nonischemic DCM, HTN , PAF and PVC's who presents today for followup. She is doing well. She denies any chest pain, SOB, DOE, LE edema, dizziness or syncope. Rarely she will have some short lived palpitations that resolved with an extra 1/2 dose of metoprolol.    Wt Readings from Last 3 Encounters:  04/04/14 142 lb 12.8 oz (64.774 kg)  03/18/14 140 lb (63.504 kg)  09/27/13 138 lb (62.596 kg)     Past Medical History  Diagnosis Date  . Hx: UTI (urinary tract infection)   . Nonischemic cardiomyopathy     last EF assessment 40% by MUGA  . History of ovarian cancer 1985  . Partial bowel obstruction   . Internal hemorrhoid   . Colon cancer 03/2006    T3, N0  . Colon polyps 12/07/2010  . Hypertension   . Atrial fibrillation   . PVC (premature ventricular contraction)   . Coronary artery disease     nonobstructive with 30% D1  . Hyperlipidemia     Current Outpatient Prescriptions  Medication Sig Dispense Refill  . amiodarone (PACERONE) 200 MG tablet Take 1 tablet (200 mg total) by mouth daily. 90 tablet 2  . atorvastatin (LIPITOR) 20 MG tablet TAKE 1 TABLET BY MOUTH DAILY. 90 tablet 0  . Coenzyme Q10 (COQ10) 100 MG CAPS Take 1 capsule by mouth daily.     Marland Kitchen conjugated estrogens (PREMARIN) vaginal cream Place 1 Applicatorful vaginally daily. (Patient taking differently: Place 1 Applicatorful vaginally daily as needed (vagina). ) 42.5 g 12  . diclofenac sodium (VOLTAREN) 1 % GEL Apply 2 g topically 4 (four) times daily. 100 g 6  . fluticasone (FLONASE) 50 MCG/ACT nasal spray INSTILL 1 SPRAY IN EACH NOSTRIL DAILY 16 g 0  . furosemide (LASIX) 20  MG tablet TAKE 1 TABLET BY MOUTH DAILY 90 tablet 0  . metoprolol succinate (TOPROL-XL) 25 MG 24 hr tablet TAKE 1 TABLET BY MOUTH EVERY DAY 90 tablet 2  . MULTIPLE VITAMINS PO Take 1 capsule by mouth daily.     Marland Kitchen olmesartan (BENICAR) 40 MG tablet Take 10 mg by mouth daily.    . potassium chloride SA (K-DUR,KLOR-CON) 20 MEQ tablet TAKE 2 TABLETS BY MOUTH IN THE MORNING AND 1 TABLET IN THE EVENING (Patient taking differently: TAKE 2 TABLETS BY MOUTH IN THE MORNING AND 1 TABLET BY MOUTH IN THE EVENING) 270 tablet 0  . traMADol (ULTRAM) 50 MG tablet Take 1 tablet (50 mg total) by mouth as needed. (Patient taking differently: Take 50 mg by mouth daily as needed (pain). ) 30 tablet 0  . tretinoin (RETIN-A) 0.1 % cream Apply 1 application topically at bedtime.     Marland Kitchen warfarin (COUMADIN) 2.5 MG tablet TAKE 1/2 TABLET BY MOUTH ON TUESDAY, THURSDAY, SATURDAY AND SUNDAY AND 1 TABLET ON MONDAY, WEDNESDAY AND FRIDAY OR AS DIRECTED 90 tablet 0  . zolpidem (AMBIEN) 5 MG tablet Take 1 tablet (5 mg total) by mouth at bedtime as needed. (Patient taking differently: Take 5 mg by mouth at bedtime as needed for sleep. ) 30 tablet 5  . busPIRone (BUSPAR) 5 MG  tablet Take 1 tablet (5 mg total) by mouth daily as needed (anxiety). (Patient not taking: Reported on 04/04/2014) 30 tablet 0   No current facility-administered medications for this visit.    Allergies:    Allergies  Allergen Reactions  . Clindamycin/Lincomycin     itching  . Sulfonamide Derivatives     REACTION: Itching/Swelling    Social History:  The patient  reports that she quit smoking about 35 years ago. Her smoking use included Cigarettes. She has a 10 pack-year smoking history. She does not have any smokeless tobacco history on file. She reports that she drinks about 8.4 oz of alcohol per week. She reports that she does not use illicit drugs.   Family History:  The patient's family history includes Heart attack in her father; Heart disease in her  father; Prostate cancer in her father; Uterine cancer in her mother. There is no history of Colon cancer.   ROS:  Please see the history of present illness.      All other systems reviewed and negative.   PHYSICAL EXAM: VS:  BP 118/78 mmHg  Pulse 63  Ht 5\' 5"  (1.651 m)  Wt 142 lb 12.8 oz (64.774 kg)  BMI 23.76 kg/m2 Well nourished, well developed, in no acute distress HEENT: normal Neck: no JVD Cardiac:  normal S1, S2; RRR; no murmur Lungs:  clear to auscultation bilaterally, no wheezing, rhonchi or rales Abd: soft, nontender, no hepatomegaly Ext: no edema Skin: warm and dry Neuro:  CNs 2-12 intact, no focal abnormalities noted  EKG:     NSR with nonspecific IVCD unchanged from prior EKG  ASSESSMENT AND PLAN:   1. Nonischemic DCM secondary to Adriamycin  - continue beta blocker/ARB/Lasix  2. HTN - controlled - continue metoprolol/Benicar  3. PVC's - continue Metoprolol  4. PAF - no reoccurence - continue Amiodarone/metoprolol/warfarin  - check TSH at time of FLP - check PFTs DLCO       5. Nonobstructive ASCAD of the diagonal with no angina  - she is not on ASA due to warfarin      6. Dyslipidemia - LDL not at goal (goal < 70) - increase atorvastatin to 40mg  daily and recheck FLP and ALT in 6 weeks  Followup with me in 6 months  Signed, Fransico Him, MD [facility] 04/04/2014 10:01 AM

## 2014-04-04 NOTE — Patient Instructions (Signed)
Your physician has recommended you make the following change in your medication:  1) INCREASE atorvastatin to 40 mg daily  Your physician recommends that you return for FASTING lab work in 6 weeks. (TSH, lipids, ALT)  Your physician has recommended that you have a pulmonary function test in 1 month. Pulmonary Function Tests are a group of tests that measure how well air moves in and out of your lungs.  Your physician wants you to follow-up in: 6 months with Dr. Radford Pax. You will receive a reminder letter in the mail two months in advance. If you don't receive a letter, please call our office to schedule the follow-up appointment.

## 2014-04-05 ENCOUNTER — Other Ambulatory Visit: Payer: Self-pay | Admitting: Cardiology

## 2014-04-07 ENCOUNTER — Other Ambulatory Visit: Payer: Self-pay | Admitting: *Deleted

## 2014-04-07 MED ORDER — ATORVASTATIN CALCIUM 40 MG PO TABS: 40.0000 mg | ORAL_TABLET | Freq: Every day | ORAL | Status: DC

## 2014-05-14 ENCOUNTER — Ambulatory Visit (INDEPENDENT_AMBULATORY_CARE_PROVIDER_SITE_OTHER): Payer: Medicare Other | Admitting: Internal Medicine

## 2014-05-14 DIAGNOSIS — I480 Paroxysmal atrial fibrillation: Secondary | ICD-10-CM

## 2014-05-14 LAB — PULMONARY FUNCTION TEST
DL/VA % pred: 72 %
DL/VA: 3.47 ml/min/mmHg/L
DLCO unc % pred: 65 %
DLCO unc: 15.79 ml/min/mmHg
FEF 25-75 Post: 4.18 L/sec
FEF 25-75 Pre: 2.96 L/sec
FEF2575-%Change-Post: 41 %
FEF2575-%Pred-Post: 262 %
FEF2575-%Pred-Pre: 186 %
FEV1-%Change-Post: 9 %
FEV1-%Pred-Post: 112 %
FEV1-%Pred-Pre: 102 %
FEV1-Post: 2.32 L
FEV1-Pre: 2.11 L
FEV1FVC-%Change-Post: 2 %
FEV1FVC-%Pred-Pre: 116 %
FEV6-%Change-Post: 7 %
FEV6-%Pred-Post: 99 %
FEV6-%Pred-Pre: 92 %
FEV6-Post: 2.59 L
FEV6-Pre: 2.41 L
FEV6FVC-%Pred-Post: 105 %
FEV6FVC-%Pred-Pre: 105 %
FVC-%Change-Post: 7 %
FVC-%Pred-Post: 93 %
FVC-%Pred-Pre: 87 %
FVC-Post: 2.59 L
FVC-Pre: 2.41 L
Post FEV1/FVC ratio: 89 %
Post FEV6/FVC ratio: 100 %
Pre FEV1/FVC ratio: 87 %
Pre FEV6/FVC Ratio: 100 %
RV % pred: 84 %
RV: 1.97 L
TLC % pred: 93 %
TLC: 4.73 L

## 2014-05-14 NOTE — Progress Notes (Signed)
PFT done today. 

## 2014-05-15 ENCOUNTER — Telehealth: Payer: Self-pay

## 2014-05-15 DIAGNOSIS — I4891 Unspecified atrial fibrillation: Secondary | ICD-10-CM

## 2014-05-15 NOTE — Telephone Encounter (Signed)
-----   Message from Sueanne Margarita, MD sent at 05/14/2014 10:14 PM EST ----- DLCO mildly reduced from 1 year ago - recheck PFTs with DLCO in 6 months

## 2014-05-15 NOTE — Telephone Encounter (Signed)
Patient informed of results and verbal understanding expressed.  PFTs ordered to be scheduled in 6 months.  Patient agrees with treatment plan.

## 2014-05-16 ENCOUNTER — Ambulatory Visit (INDEPENDENT_AMBULATORY_CARE_PROVIDER_SITE_OTHER): Payer: Medicare Other | Admitting: *Deleted

## 2014-05-16 ENCOUNTER — Other Ambulatory Visit (INDEPENDENT_AMBULATORY_CARE_PROVIDER_SITE_OTHER): Payer: Medicare Other | Admitting: *Deleted

## 2014-05-16 DIAGNOSIS — I2583 Coronary atherosclerosis due to lipid rich plaque: Secondary | ICD-10-CM

## 2014-05-16 DIAGNOSIS — E785 Hyperlipidemia, unspecified: Secondary | ICD-10-CM | POA: Diagnosis not present

## 2014-05-16 DIAGNOSIS — I4891 Unspecified atrial fibrillation: Secondary | ICD-10-CM | POA: Diagnosis not present

## 2014-05-16 DIAGNOSIS — I2510 Atherosclerotic heart disease of native coronary artery without angina pectoris: Secondary | ICD-10-CM

## 2014-05-16 DIAGNOSIS — I480 Paroxysmal atrial fibrillation: Secondary | ICD-10-CM | POA: Diagnosis not present

## 2014-05-16 LAB — LIPID PANEL
Cholesterol: 166 mg/dL (ref 0–200)
HDL: 90.2 mg/dL (ref >39.00)
LDL Cholesterol: 51 mg/dL (ref 0–99)
NonHDL: 75.8
Total CHOL/HDL Ratio: 2
Triglycerides: 126 mg/dL (ref 0.0–149.0)
VLDL: 25.2 mg/dL (ref 0.0–40.0)

## 2014-05-16 LAB — TSH: TSH: 5.15 u[IU]/mL — ABNORMAL HIGH (ref 0.35–4.50)

## 2014-05-16 LAB — POCT INR: INR: 2.1

## 2014-05-16 LAB — ALT: ALT: 49 U/L — ABNORMAL HIGH (ref 0–35)

## 2014-05-19 ENCOUNTER — Telehealth: Payer: Self-pay | Admitting: Internal Medicine

## 2014-05-21 ENCOUNTER — Encounter: Payer: Self-pay | Admitting: Cardiology

## 2014-05-21 NOTE — Telephone Encounter (Signed)
New Message     Patient is returning nurses call please call back .   Thanks

## 2014-05-21 NOTE — Telephone Encounter (Signed)
This encounter was created in error - please disregard.

## 2014-05-29 ENCOUNTER — Other Ambulatory Visit: Payer: Self-pay | Admitting: Geriatric Medicine

## 2014-05-29 MED ORDER — BUSPIRONE HCL 5 MG PO TABS: 5.0000 mg | ORAL_TABLET | Freq: Every day | ORAL | Status: DC | PRN

## 2014-05-29 NOTE — Telephone Encounter (Signed)
Pt called in wanted to if we could refill her busPIRone (BUSPAR) 5 MG tablet [[id number]]   Needs to be sent to [facility]

## 2014-05-29 NOTE — Telephone Encounter (Signed)
Sent to pharmacy 

## 2014-06-17 ENCOUNTER — Other Ambulatory Visit: Payer: Self-pay | Admitting: Cardiology

## 2014-06-27 ENCOUNTER — Encounter: Payer: Self-pay | Admitting: Cardiology

## 2014-06-27 ENCOUNTER — Ambulatory Visit (INDEPENDENT_AMBULATORY_CARE_PROVIDER_SITE_OTHER): Payer: Medicare Other | Admitting: *Deleted

## 2014-06-27 DIAGNOSIS — I4891 Unspecified atrial fibrillation: Secondary | ICD-10-CM

## 2014-06-27 LAB — POCT INR: INR: 2.1

## 2014-06-30 ENCOUNTER — Other Ambulatory Visit: Payer: Self-pay | Admitting: Cardiology

## 2014-07-09 DIAGNOSIS — Z1231 Encounter for screening mammogram for malignant neoplasm of breast: Secondary | ICD-10-CM | POA: Diagnosis not present

## 2014-07-09 DIAGNOSIS — R922 Inconclusive mammogram: Secondary | ICD-10-CM | POA: Diagnosis not present

## 2014-07-09 DIAGNOSIS — Z803 Family history of malignant neoplasm of breast: Secondary | ICD-10-CM | POA: Diagnosis not present

## 2014-07-09 DIAGNOSIS — R928 Other abnormal and inconclusive findings on diagnostic imaging of breast: Secondary | ICD-10-CM | POA: Diagnosis not present

## 2014-07-10 DIAGNOSIS — R922 Inconclusive mammogram: Secondary | ICD-10-CM | POA: Diagnosis not present

## 2014-07-10 DIAGNOSIS — Z803 Family history of malignant neoplasm of breast: Secondary | ICD-10-CM | POA: Diagnosis not present

## 2014-07-10 DIAGNOSIS — R928 Other abnormal and inconclusive findings on diagnostic imaging of breast: Secondary | ICD-10-CM | POA: Diagnosis not present

## 2014-07-10 DIAGNOSIS — Z1231 Encounter for screening mammogram for malignant neoplasm of breast: Secondary | ICD-10-CM | POA: Diagnosis not present

## 2014-07-29 ENCOUNTER — Encounter: Payer: Self-pay | Admitting: Internal Medicine

## 2014-08-08 ENCOUNTER — Encounter: Payer: Self-pay | Admitting: Gastroenterology

## 2014-08-11 ENCOUNTER — Ambulatory Visit (INDEPENDENT_AMBULATORY_CARE_PROVIDER_SITE_OTHER): Payer: Medicare Other | Admitting: *Deleted

## 2014-08-11 DIAGNOSIS — I4891 Unspecified atrial fibrillation: Secondary | ICD-10-CM | POA: Diagnosis not present

## 2014-08-11 LAB — POCT INR: INR: 3.1

## 2014-08-12 DIAGNOSIS — H2513 Age-related nuclear cataract, bilateral: Secondary | ICD-10-CM | POA: Diagnosis not present

## 2014-08-12 DIAGNOSIS — H1859 Other hereditary corneal dystrophies: Secondary | ICD-10-CM | POA: Diagnosis not present

## 2014-08-20 ENCOUNTER — Encounter: Payer: Self-pay | Admitting: Internal Medicine

## 2014-08-29 ENCOUNTER — Other Ambulatory Visit: Payer: Self-pay | Admitting: Cardiology

## 2014-09-18 DIAGNOSIS — J300 Vasomotor rhinitis: Secondary | ICD-10-CM | POA: Diagnosis not present

## 2014-09-18 DIAGNOSIS — R490 Dysphonia: Secondary | ICD-10-CM | POA: Diagnosis not present

## 2014-09-18 DIAGNOSIS — R251 Tremor, unspecified: Secondary | ICD-10-CM | POA: Diagnosis not present

## 2014-09-18 DIAGNOSIS — Z87891 Personal history of nicotine dependence: Secondary | ICD-10-CM | POA: Diagnosis not present

## 2014-09-18 DIAGNOSIS — J387 Other diseases of larynx: Secondary | ICD-10-CM | POA: Diagnosis not present

## 2014-09-18 NOTE — Progress Notes (Signed)
Cardiology Office Note   Date:  09/19/2014   ID:  Cynthia Frank, Service 08/02/1935, MRN [id number]  PCP:  Olga Millers, MD    Chief Complaint  Patient presents with  . Follow-up    CAD      History of Present Illness: Cynthia Frank is a 79 y.o. female with a history of Adriamycin induced nonischemic DCM, HTN, nonobstructive CAD, PAF and PVC's who presents today for followup. She is doing well. She denies any chest pain,  LE edema, dizziness or syncope.   She has recently been diagnosed with dysphonia.  She has been having problems with her speaking voice and also has had some SOB.  She is going to try botox treatments.  Rarely she will have some short lived palpitations that resolve quickly.     Past Medical History  Diagnosis Date  . Hx: UTI (urinary tract infection)   . Nonischemic cardiomyopathy     last EF assessment 40% by MUGA  . History of ovarian cancer 1985  . Partial bowel obstruction   . Internal hemorrhoid   . Colon cancer 03/2006    T3, N0  . Colon polyps 12/07/2010  . Hypertension   . Atrial fibrillation   . PVC (premature ventricular contraction)   . Coronary artery disease     nonobstructive with 30% D1  . Hyperlipidemia     Past Surgical History  Procedure Laterality Date  . Abdominal hysterectomy    . Oophorectomy      '61  . Tonsillectomy      '44  . Resection of right ovarian cancer '84, repeat '86    . Laparotomy      '80 adhesions  . Resection due to partial bowel obstruction      1985  . Colon surgery      for colon cancer in 2007  . Laparotomy for takedown of intestinal obstruction secondary to adhesions      1989  . Ankle closed reduction  10/16/2011    Procedure: CLOSED REDUCTION ANKLE;  Surgeon: Tobi Bastos, MD;  Location: [facility];  Service: Orthopedics;  Laterality: Left;  . Colonoscopy N/A 03/11/2013    Procedure: COLONOSCOPY;  Surgeon: Ladene Artist, MD;  Location: [facility];  Service:  Endoscopy;  Laterality: N/A;     Current Outpatient Prescriptions  Medication Sig Dispense Refill  . amiodarone (PACERONE) 200 MG tablet TAKE 1 TABLET BY MOUTH DAILY 90 tablet 0  . atorvastatin (LIPITOR) 40 MG tablet Take 1 tablet (40 mg total) by mouth daily. 90 tablet 1  . busPIRone (BUSPAR) 5 MG tablet Take 1 tablet (5 mg total) by mouth daily as needed (anxiety). 30 tablet 3  . Coenzyme Q10 (COQ10) 100 MG CAPS Take 1 capsule by mouth daily.     Marland Kitchen conjugated estrogens (PREMARIN) vaginal cream Place 1 Applicatorful vaginally daily. (Patient taking differently: Place 1 Applicatorful vaginally daily as needed (vagina). Pt states that she apply the application externally.) 42.5 g 12  . diclofenac sodium (VOLTAREN) 1 % GEL Apply 2 g topically 4 (four) times daily. 100 g 6  . furosemide (LASIX) 20 MG tablet TAKE 1 TABLET BY MOUTH EVERY DAY 90 tablet 0  . ipratropium (ATROVENT) 0.03 % nasal spray Place 2 sprays into both nostrils 3 (three) times daily.    . metoprolol succinate (TOPROL-XL) 25 MG 24 hr tablet TAKE 1 TABLET BY  MOUTH EVERY DAY 90 tablet 2  . MULTIPLE VITAMINS PO Take 1 capsule by mouth daily.     Marland Kitchen olmesartan (BENICAR) 40 MG tablet Take 10 mg by mouth daily.    . potassium chloride SA (K-DUR,KLOR-CON) 20 MEQ tablet TAKE 2 TABLETS BY MOUTH EVERY MORNING AND 1 EVERY EVENING 270 tablet 2  . traMADol (ULTRAM) 50 MG tablet Take 1 tablet (50 mg total) by mouth as needed. (Patient taking differently: Take 50 mg by mouth daily as needed (pain). ) 30 tablet 0  . tretinoin (RETIN-A) 0.1 % cream Apply 1 application topically at bedtime.     Marland Kitchen warfarin (COUMADIN) 2.5 MG tablet TAKE 1/2 TABLET BY MOUTH ON TUESDAY, THURSDAY, SATURDAY AND SUNDAY AND 1 TABLET ON MONDAY, WEDNESDAY AND FRIDAY OR AS DIRECTED 90 tablet 0  . zolpidem (AMBIEN) 5 MG tablet Take 1 tablet (5 mg total) by mouth at bedtime as needed. (Patient taking differently: Take 5 mg by mouth at bedtime as needed for sleep. ) 30 tablet 5    No current facility-administered medications for this visit.    Allergies:   Clindamycin/lincomycin and Sulfonamide derivatives    Social History:  The patient  reports that she quit smoking about 36 years ago. Her smoking use included Cigarettes. She has a 10 pack-year smoking history. She does not have any smokeless tobacco history on file. She reports that she drinks about 8.4 oz of alcohol per week. She reports that she does not use illicit drugs.   Family History:  The patient's family history includes Heart attack in her father; Heart disease in her father; Prostate cancer in her father; Uterine cancer in her mother. There is no history of Colon cancer.    ROS:  Please see the history of present illness.   Otherwise, review of systems are positive for none.   All other systems are reviewed and negative.    PHYSICAL EXAM: VS:  BP 118/70 mmHg  Pulse 61  Ht 5\' 5"  (1.651 m)  Wt 142 lb 12.8 oz (64.774 kg)  BMI 23.76 kg/m2  SpO2 96% , BMI Body mass index is 23.76 kg/(m^2). GEN: Well nourished, well developed, in no acute distress HEENT: normal Neck: no JVD, carotid bruits, or masses Cardiac: RRR; no murmurs, rubs, or gallops,no edema  Respiratory:  clear to auscultation bilaterally, normal work of breathing GI: soft, nontender, nondistended, + BS MS: no deformity or atrophy Skin: warm and dry, no rash Neuro:  Strength and sensation are intact Psych: euthymic mood, full affect   EKG:  EKG is not ordered today.    Recent Labs: 03/18/2014: BUN 14; Creatinine 1.0; Potassium 4.3; Sodium 138 05/16/2014: ALT 49*; TSH 5.15*    Lipid Panel    Component Value Date/Time   CHOL 166 05/16/2014 0910   TRIG 126.0 05/16/2014 0910   HDL 90.20 05/16/2014 0910   CHOLHDL 2 05/16/2014 0910   VLDL 25.2 05/16/2014 0910   LDLCALC 51 05/16/2014 0910      Wt Readings from Last 3 Encounters:  09/19/14 142 lb 12.8 oz (64.774 kg)  04/04/14 142 lb 12.8 oz (64.774 kg)  03/18/14 140 lb  (63.504 kg)     ASSESSMENT AND PLAN:   1. Nonischemic DCM secondary to Adriamycin  - continue beta blocker/ARB/Lasix  2. HTN - controlled - continue metoprolol/Benicar  3. PVC's - continue Metoprolol  4. PAF - no reoccurence - last TSH was mildly elevated and ALT mildly elevated.  I will recheck TSH and ALT today.  DLCO was mildly reduced last check so will recheck again July 2016 - continue Amiodarone/metoprolol/warfarin   5. Nonobstructive ASCAD of the diagonal with no angina  - she is not on ASA due to warfarin  6.  Dyslipidemia - LDL at goal at 51      7.  Mild SOB which is most likely due to her vocal cord dysfunction but given her reduced lung function on PFTs in January need to consider primary lung issue as well.  Will repeat PFTs - continue atorvastatin     Current medicines are reviewed at length with the patient today.  The patient does not have concerns regarding medicines.  The following changes have been made:  no change  Labs/ tests ordered today: See above Assessment and Plan No orders of the defined types were placed in this encounter.     Disposition:   FU with me in 6 months  Signed, Sueanne Margarita, MD  09/19/2014 9:25 AM    [facility] [address], [place], [place]  [address] Phone: [phone]; Fax: [phone]

## 2014-09-19 ENCOUNTER — Ambulatory Visit (INDEPENDENT_AMBULATORY_CARE_PROVIDER_SITE_OTHER): Payer: Medicare Other

## 2014-09-19 ENCOUNTER — Encounter: Payer: Self-pay | Admitting: Cardiology

## 2014-09-19 ENCOUNTER — Other Ambulatory Visit: Payer: Self-pay | Admitting: Internal Medicine

## 2014-09-19 ENCOUNTER — Ambulatory Visit (INDEPENDENT_AMBULATORY_CARE_PROVIDER_SITE_OTHER): Payer: Medicare Other | Admitting: Cardiology

## 2014-09-19 VITALS — BP 118/70 | HR 61 | Ht 65.0 in | Wt 142.8 lb

## 2014-09-19 DIAGNOSIS — I4891 Unspecified atrial fibrillation: Secondary | ICD-10-CM | POA: Diagnosis not present

## 2014-09-19 DIAGNOSIS — I10 Essential (primary) hypertension: Secondary | ICD-10-CM | POA: Diagnosis not present

## 2014-09-19 DIAGNOSIS — I429 Cardiomyopathy, unspecified: Secondary | ICD-10-CM | POA: Diagnosis not present

## 2014-09-19 DIAGNOSIS — E785 Hyperlipidemia, unspecified: Secondary | ICD-10-CM | POA: Diagnosis not present

## 2014-09-19 DIAGNOSIS — I428 Other cardiomyopathies: Secondary | ICD-10-CM

## 2014-09-19 DIAGNOSIS — I2583 Coronary atherosclerosis due to lipid rich plaque: Principal | ICD-10-CM

## 2014-09-19 DIAGNOSIS — I2510 Atherosclerotic heart disease of native coronary artery without angina pectoris: Secondary | ICD-10-CM | POA: Diagnosis not present

## 2014-09-19 DIAGNOSIS — I493 Ventricular premature depolarization: Secondary | ICD-10-CM

## 2014-09-19 LAB — BASIC METABOLIC PANEL
BUN: 14 mg/dL (ref 6–23)
CO2: 29 mEq/L (ref 19–32)
Calcium: 9.1 mg/dL (ref 8.4–10.5)
Chloride: 99 mEq/L (ref 96–112)
Creatinine, Ser: 1.1 mg/dL (ref 0.40–1.20)
GFR: 51.23 mL/min — ABNORMAL LOW (ref >60.00)
Glucose, Bld: 89 mg/dL (ref 70–99)
Potassium: 4 mEq/L (ref 3.5–5.1)
Sodium: 133 mEq/L — ABNORMAL LOW (ref 135–145)

## 2014-09-19 LAB — HEPATIC FUNCTION PANEL
ALT: 60 U/L — ABNORMAL HIGH (ref 0–35)
AST: 51 U/L — ABNORMAL HIGH (ref 0–37)
Albumin: 4.1 g/dL (ref 3.5–5.2)
Alkaline Phosphatase: 75 U/L (ref 39–117)
Bilirubin, Direct: 0.3 mg/dL (ref 0.0–0.3)
Total Bilirubin: 0.9 mg/dL (ref 0.2–1.2)
Total Protein: 7 g/dL (ref 6.0–8.3)

## 2014-09-19 LAB — POCT INR: INR: 2.6

## 2014-09-19 LAB — TSH: TSH: 5.34 u[IU]/mL — ABNORMAL HIGH (ref 0.35–4.50)

## 2014-09-19 MED ORDER — OLMESARTAN MEDOXOMIL 20 MG PO TABS: 10.0000 mg | ORAL_TABLET | Freq: Every day | ORAL | Status: DC

## 2014-09-19 NOTE — Patient Instructions (Addendum)
Medication Instructions:  Your physician recommends that you continue on your current medications as directed. Please refer to the Current Medication list given to you today.   Labwork: TODAY: BMET, TSH, LFTs  Testing/Procedures: Your physician has recommended that you have a pulmonary function test in July, 2016. Pulmonary Function Tests are a group of tests that measure how well air moves in and out of your lungs.  Follow-Up: Your physician wants you to follow-up in: 6 months with Dr. Radford Pax. You will receive a reminder letter in the mail two months in advance. If you don't receive a letter, please call our office to schedule the follow-up appointment.   Any Other Special Instructions Will Be Listed Below (If Applicable).

## 2014-09-19 NOTE — Telephone Encounter (Signed)
ambien rx sent to [facility]

## 2014-09-26 NOTE — Addendum Note (Signed)
Addended by: Harland German A on: 09/26/2014 05:14 PM   Modules accepted: Orders

## 2014-09-29 ENCOUNTER — Other Ambulatory Visit: Payer: Self-pay | Admitting: Cardiology

## 2014-10-01 ENCOUNTER — Telehealth: Payer: Self-pay

## 2014-10-01 DIAGNOSIS — E785 Hyperlipidemia, unspecified: Secondary | ICD-10-CM

## 2014-10-01 NOTE — Telephone Encounter (Signed)
Informed patient of results and verbal understanding expressed.   Repeat LFTs scheduled for October 3rd.  Patient agrees with treatment plan.

## 2014-10-01 NOTE — Telephone Encounter (Signed)
-----   Message from Sueanne Margarita, MD sent at 09/21/2014 10:19 PM EDT ----- LFTs stable.  TSH mildly elevated.  Patient is on Amio.   Please forward these labs to PCP for further evaulation and treatment of hypothyroidism that is probably related to Amio.  REcheck LFTs in 4 months

## 2014-10-03 ENCOUNTER — Other Ambulatory Visit: Payer: Self-pay | Admitting: Cardiology

## 2014-10-06 ENCOUNTER — Other Ambulatory Visit (HOSPITAL_COMMUNITY): Payer: Self-pay | Admitting: Respiratory Therapy

## 2014-10-20 DIAGNOSIS — J209 Acute bronchitis, unspecified: Secondary | ICD-10-CM | POA: Diagnosis not present

## 2014-10-22 ENCOUNTER — Encounter: Payer: Self-pay | Admitting: Internal Medicine

## 2014-10-22 ENCOUNTER — Other Ambulatory Visit (INDEPENDENT_AMBULATORY_CARE_PROVIDER_SITE_OTHER): Payer: Medicare Other

## 2014-10-22 ENCOUNTER — Ambulatory Visit (INDEPENDENT_AMBULATORY_CARE_PROVIDER_SITE_OTHER): Payer: Medicare Other | Admitting: Internal Medicine

## 2014-10-22 VITALS — BP 110/78 | HR 67 | Ht 65.0 in | Wt 139.2 lb

## 2014-10-22 DIAGNOSIS — I2510 Atherosclerotic heart disease of native coronary artery without angina pectoris: Secondary | ICD-10-CM | POA: Diagnosis not present

## 2014-10-22 DIAGNOSIS — R0600 Dyspnea, unspecified: Secondary | ICD-10-CM | POA: Diagnosis not present

## 2014-10-22 LAB — CBC WITH DIFFERENTIAL/PLATELET
Basophils Absolute: 0 10*3/uL (ref 0.0–0.1)
Basophils Relative: 0.6 % (ref 0.0–3.0)
Eosinophils Absolute: 0.1 10*3/uL (ref 0.0–0.7)
Eosinophils Relative: 1.5 % (ref 0.0–5.0)
HCT: 40.4 % (ref 36.0–46.0)
Hemoglobin: 13.3 g/dL (ref 12.0–15.0)
Lymphocytes Relative: 17.3 % (ref 12.0–46.0)
Lymphs Abs: 1.3 10*3/uL (ref 0.7–4.0)
MCHC: 33 g/dL (ref 30.0–36.0)
MCV: 91.4 fl (ref 78.0–100.0)
Monocytes Absolute: 0.6 10*3/uL (ref 0.1–1.0)
Monocytes Relative: 7.9 % (ref 3.0–12.0)
Neutro Abs: 5.6 10*3/uL (ref 1.4–7.7)
Neutrophils Relative %: 72.7 % (ref 43.0–77.0)
Platelets: 244 10*3/uL (ref 150.0–400.0)
RBC: 4.42 Mil/uL (ref 3.87–5.11)
RDW: 13.7 % (ref 11.5–15.5)
WBC: 7.7 10*3/uL (ref 4.0–10.5)

## 2014-10-22 LAB — SEDIMENTATION RATE: Sed Rate: 55 mm/hr — ABNORMAL HIGH (ref 0–22)

## 2014-10-22 LAB — BASIC METABOLIC PANEL
BUN: 14 mg/dL (ref 6–23)
CO2: 25 mEq/L (ref 19–32)
Calcium: 9.3 mg/dL (ref 8.4–10.5)
Chloride: 99 mEq/L (ref 96–112)
Creatinine, Ser: 0.94 mg/dL (ref 0.40–1.20)
GFR: 61.41 mL/min (ref >60.00)
Glucose, Bld: 123 mg/dL — ABNORMAL HIGH (ref 70–99)
Potassium: 4.3 mEq/L (ref 3.5–5.1)
Sodium: 135 mEq/L (ref 135–145)

## 2014-10-22 LAB — BRAIN NATRIURETIC PEPTIDE: Pro B Natriuretic peptide (BNP): 994 pg/mL — ABNORMAL HIGH (ref 0.0–100.0)

## 2014-10-22 LAB — TSH: TSH: 2.41 u[IU]/mL (ref 0.35–4.50)

## 2014-10-22 NOTE — Progress Notes (Addendum)
Subjective:    Patient ID: Cynthia Frank, female    DOB: 1935/07/15   MRN: [id number]  HPI  79 yowf  Quit smoking age 79 referred 05/16/2012 to pulmonary clinic by Dr Linda Hedges for sob.   05/16/2012 1st pulmonary eval on Altace/ amiodarone  cc 2.5 month noct sob/choking in setting of pnds x 5-6 years and doe x steps both symptoms highly variable but sob and  Cough gone p ambien at hs. Typically in am produces < Tbsp white mucus and already rx'd with antihistamines decongestants and ppi s improvement.  Feels sense of fullness in throat when swallows rec Stop lisinopril and start benicar 20 mg one half daily and cough / choking/ breathing will gradually improve over the next 4 weeks In meantime as long as coughing take Pepcid 20 mg on at bedtime and continue protonix before bfast daily and no more fish oil Take delsym two tsp every 12 hours and supplement if needed with  tramadol 50 mg up to 2 every 4 hours to suppress the urge to cough. Swallowing water or using ice chips/non mint and menthol containing candies (such as lifesavers or sugarless jolly ranchers) are also effective.  You should rest your voice and avoid activities that you know make you cough. Once you have eliminated the cough for 3 straight days try reducing the tramadol first,  then the delsym as tolerated.   >>> cough resolved/ no sob    10/22/2014 acute  ov/Gertha Lichtenberg consult Dr Radford Pax re: sob/ abn pfts Chief Complaint  Patient presents with  . Acute Visit    Pt c/o weakness and cough for the past 10 days. Cough is occ prod with yellow sputum.    sob x steps x one decade but fine walking anywhere she wants to go. 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 No unusual  exp hx or h/o childhood pna/ asthma or knowledge of premature birth.  Sleeping ok without nocturnal  or early am exacerbation  of respiratory  c/o's or need for noct saba. Also denies any obvious fluctuation of symptoms with weather or environmental changes or other aggravating or alleviating factors except as outlined above   Current Medications, Allergies, Complete Past Medical History, Past Surgical History, Family History, and Social History were reviewed in [employer] record.  ROS  The following are not active complaints unless bolded sore throat, dysphagia, dental problems, itching, sneezing,  nasal congestion or excess/ purulent secretions, ear ache,   fever, chills, sweats, unintended wt loss, pleuritic or exertional cp, hemoptysis,  orthopnea pnd or leg swelling, presyncope, palpitations, abdominal pain, anorexia, nausea, vomiting, diarrhea  or change in bowel or urinary habits, change in stools or urine, dysuria,hematuria,  rash, arthralgias, visual complaints, headache, numbness weakness or ataxia or problems with walking or coordination,  change in mood/affect or memory.              Objective:   Physical Exam  10/22/2014         139  Wt Readings from Last 3 Encounters:  05/16/12 145 lb (65.772 kg)  02/15/12 140 lb (63.504 kg)  11/25/11 140 lb (63.504 kg)   amb wf with voice  tremor  HEENT: nl dentition, turbinates, and orophanx. Nl external ear canals without cough reflex   NECK :  without JVD/Nodes/TM/ nl carotid upstrokes bilaterally   LUNGS: no acc muscle use, clear to A and P bilaterally without cough on insp or exp maneuvers   CV:  RRR  no s3 or murmur or increase in P2, no edema   ABD:  soft and nontender with nl excursion in the supine position. No bruits or organomegaly, bowel sounds nl  MS:  warm without deformities, calf tenderness, cyanosis or clubbing  SKIN: warm and dry without lesions    NEURO:  alert, approp, no deficits      I personally reviewed images and agree with radiology impression as follows:  CXR: October 20 2014 Def increased int changes with no baseline and ? LLL inf/ ?small effusion     Labs ordered/ reviewed:    Lab 10/22/14 0942  NA 135  K 4.3  CL 99  CO2 25  BUN 14  CREATININE 0.94  GLUCOSE 123*   Lab 10/22/14 0942  HGB 13.3  HCT 40.4  WBC 7.7  PLT 244.0     Lab Results  Component Value Date   TSH 2.41 10/22/2014     Lab Results  Component Value Date   PROBNP 994.0* 10/22/2014     Lab Results  Component Value Date   ESRSEDRATE 55* 10/22/2014             Assessment & Plan:

## 2014-10-22 NOTE — Patient Instructions (Addendum)
Try prilosec otc 20mg   Take 30-60 min before first meal of the day and Pepcid ac (famotidine) 20 mg one @  bedtime until cough is completely gone for at least a week without the need for cough suppression  GERD (REFLUX)  is an extremely common cause of respiratory symptoms just like yours , many times with no obvious heartburn at all.    It can be treated with medication, but also with lifestyle changes including elevation of the head of your bed (ideally with 6 inch  bed blocks),  Smoking cessation, avoidance of late meals, excessive alcohol, and avoid fatty foods, chocolate, peppermint, colas, red wine, and acidic juices such as orange juice.  NO MINT OR MENTHOL PRODUCTS SO NO COUGH DROPS  USE SUGARLESS CANDY INSTEAD (Jolley ranchers or Stover's or Life Savers) or even ice chips will also do - the key is to swallow to prevent all throat clearing. NO OIL BASED VITAMINS - use powdered substitutes.   Please remember to go to the lab  department downstairs for your tests - we will call you with the results when they are available.    Please schedule a follow up office visit in 6 weeks, call sooner if needed with pfts and cxr on return  Add return for cxr in 2 weeks

## 2014-10-23 ENCOUNTER — Encounter (HOSPITAL_COMMUNITY): Payer: BLUE CROSS/BLUE SHIELD

## 2014-10-23 ENCOUNTER — Encounter: Payer: Self-pay | Admitting: *Deleted

## 2014-10-23 ENCOUNTER — Encounter: Payer: Self-pay | Admitting: Internal Medicine

## 2014-10-23 NOTE — Assessment & Plan Note (Signed)
-  PFTs 05/23/13   VC 3.04 dlco 71 corrects to 84 - PFTs 05/14/14 VC 2.76 dlco 65 corrects to 72  - 10/22/2014  Walked RA x 3 laps @ 185 ft each stopped due to  End of study, no desats     I had an extended discussion with the patient reviewing all relevant studies completed to date x 91m  1) the cough/ uacs we previously saw her for was clearly acei assoc   2) she still could have an element of uacs based on occult gerd, a common feature in acei intol, so rec empirically treating that x 6 weeks before returing fo rpfts   3) the esr is bothersome for amio toxicity but she's just getting over an infection so will repeat when returns for pfts  4) the BNP is elevated but being actively followed by Dr Radford Pax - any element of chf will make it difficult to detect amio toxcicity clinically though note vasc congestion in the lung drives the dlco up, not down.   Each maintenance medication was reviewed in detail including most importantly the difference between maintenance and prns and under what circumstances the prns are to be triggered using an action plan format that is not reflected in the computer generated alphabetically organized AVS.    Please see instructions for details which were reviewed in writing and the patient given a copy highlighting the part that I personally wrote and discussed at today's ov.

## 2014-10-27 ENCOUNTER — Telehealth: Payer: Self-pay | Admitting: *Deleted

## 2014-10-27 NOTE — Progress Notes (Signed)
Quick Note:  LMTCB ______ 

## 2014-10-27 NOTE — Telephone Encounter (Signed)
-----   Message from Tanda Rockers, MD sent at 10/27/2014  1:18 PM EDT ----- Needs cxr Ok [place] this week

## 2014-10-27 NOTE — Telephone Encounter (Signed)
LMTCB

## 2014-10-28 NOTE — Telephone Encounter (Signed)
Called t and appt scheduled for Tuesday at 4:30. Nothing further needed

## 2014-10-28 NOTE — Telephone Encounter (Signed)
Spoke with pt. I offered appt this afternoon but refused stating she was busy and not able to come in tomorrow either. She is leaving early thurs Am out of town and will be back Sunday. She wants to know if she can wait and come in next week? Please advise thanks

## 2014-10-28 NOTE — Telephone Encounter (Signed)
Yes this is fine.

## 2014-10-28 NOTE — Telephone Encounter (Signed)
Patient returned call, she may be reached at [phone] or [phone].

## 2014-10-29 NOTE — Progress Notes (Signed)
Quick Note:  Spoke with pt's husband. He stated she was out for the evening and would have her call us back tomorrow morning. ______

## 2014-11-03 ENCOUNTER — Ambulatory Visit (INDEPENDENT_AMBULATORY_CARE_PROVIDER_SITE_OTHER): Payer: Medicare Other | Admitting: *Deleted

## 2014-11-03 DIAGNOSIS — I4891 Unspecified atrial fibrillation: Secondary | ICD-10-CM

## 2014-11-03 LAB — POCT INR: INR: 3.8

## 2014-11-04 ENCOUNTER — Ambulatory Visit (INDEPENDENT_AMBULATORY_CARE_PROVIDER_SITE_OTHER): Payer: Medicare Other | Admitting: Internal Medicine

## 2014-11-04 ENCOUNTER — Other Ambulatory Visit (INDEPENDENT_AMBULATORY_CARE_PROVIDER_SITE_OTHER): Payer: Medicare Other

## 2014-11-04 ENCOUNTER — Encounter: Payer: Self-pay | Admitting: Internal Medicine

## 2014-11-04 ENCOUNTER — Ambulatory Visit (INDEPENDENT_AMBULATORY_CARE_PROVIDER_SITE_OTHER)
Admission: RE | Admit: 2014-11-04 | Discharge: 2014-11-04 | Disposition: A | Discharge status: Home / Self Care | Payer: Medicare Other | Source: Ambulatory Visit | Attending: Internal Medicine | Admitting: Internal Medicine

## 2014-11-04 VITALS — BP 102/70 | HR 70 | Ht 65.0 in | Wt 137.0 lb

## 2014-11-04 DIAGNOSIS — J189 Pneumonia, unspecified organism: Secondary | ICD-10-CM

## 2014-11-04 DIAGNOSIS — I2510 Atherosclerotic heart disease of native coronary artery without angina pectoris: Secondary | ICD-10-CM

## 2014-11-04 DIAGNOSIS — R0600 Dyspnea, unspecified: Secondary | ICD-10-CM

## 2014-11-04 DIAGNOSIS — R05 Cough: Secondary | ICD-10-CM | POA: Diagnosis not present

## 2014-11-04 LAB — BRAIN NATRIURETIC PEPTIDE: Pro B Natriuretic peptide (BNP): 811 pg/mL — ABNORMAL HIGH (ref 0.0–100.0)

## 2014-11-04 NOTE — Progress Notes (Signed)
Subjective:    Patient ID: Cynthia Frank, female    DOB: [birth year]   MRN: [id number]    Brief patient profile:  35 yowf  Quit smoking age 79 referred 05/16/2012 to pulmonary clinic by Dr Linda Hedges for sob.   History of Present Illness  05/16/2012 1st pulmonary eval on Altace/ amiodarone  cc 2.5 month noct sob/choking in setting of pnds x 5-6 years and doe x steps both symptoms highly variable but sob and  Cough gone p ambien at hs. Typically in am produces < Tbsp white mucus and already rx'd with antihistamines decongestants and ppi s improvement.  Feels sense of fullness in throat when swallows rec Stop lisinopril and start benicar 20 mg one half daily and cough / choking/ breathing will gradually improve over the next 4 weeks In meantime as long as coughing take Pepcid 20 mg on at bedtime and continue protonix before bfast daily and no more fish oil Take delsym two tsp every 12 hours and supplement if needed with  tramadol 50 mg up to 2 every 4 hours to suppress the urge to cough. Swallowing water or using ice chips/non mint and menthol containing candies (such as lifesavers or sugarless jolly ranchers) are also effective.  You should rest your voice and avoid activities that you know make you cough. Once you have eliminated the cough for 3 straight days try reducing the tramadol first,  then the delsym as tolerated.   >>> cough resolved/ no sob    10/22/2014 acute  ov/Shiza Thelen consult Dr Radford Pax re: sob/ abn pfts Chief Complaint  Patient presents with  . Acute Visit    Pt c/o weakness and cough for the past 10 days. Cough is occ prod with yellow sputum.    sob x steps x one decade but fine walking anywhere she wants to go. 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 Follow-up    Pt states that she is feeling better- coughing less and her breathing has also improved.    Not limited by breathing from desired activities  Though no aerobics   No obvious day to day or daytime variabilty or assoc chronic cough or cp or chest tightness, subjective wheeze overt sinus or hb symptoms. No unusual exp hx or h/o childhood pna/ asthma or knowledge of premature birth.  Sleeping ok without nocturnal  or early am exacerbation  of respiratory  c/o's or need for noct saba. Also denies any obvious fluctuation of symptoms with weather or environmental changes or other aggravating or alleviating factors except as outlined above   Current Medications, Allergies, Complete Past Medical History, Past Surgical History, Family History, and Social History [facility].  ROS  The following are not active complaints unless bolded sore throat, dysphagia, dental problems, itching, sneezing,  nasal congestion or excess/ purulent secretions, ear ache,   fever, chills, sweats, unintended wt loss,  pleuritic or exertional cp, hemoptysis,  orthopnea pnd or leg swelling, presyncope, palpitations, abdominal pain, anorexia, nausea, vomiting, diarrhea  or change in bowel or urinary habits, change in stools or urine, dysuria,hematuria,  rash, arthralgias, visual complaints, headache, numbness weakness or ataxia or problems with walking or coordination,  change in mood/affect or memory.              Objective:   Physical Exam  10/22/2014         139 >  11/04/2014  137  Wt Readings from Last 3 Encounters:  05/16/12 145 lb (65.772 kg)  02/15/12 140 lb (63.504 kg)  11/25/11 140 lb (63.504  kg)   amb wf with voice tremor  HEENT: nl dentition, turbinates, and orophanx. Nl external ear canals without cough reflex   NECK :  without JVD/Nodes/TM/ nl carotid upstrokes bilaterally   LUNGS: no acc muscle use, clear to A and P bilaterally without cough on insp or exp maneuvers   CV:  RRR  no s3 or murmur or increase in P2, no edema   ABD:  soft and nontender with nl excursion in the supine position. No bruits or organomegaly, bowel sounds nl  MS:  warm without deformities, calf tenderness, cyanosis or clubbing  SKIN: warm and dry without lesions    NEURO:  alert, approp, no deficits     CXR PA and Lateral:   11/04/2014 :     I personally reviewed images and agree with radiology impression as follows:   1. No acute cardiopulmonary process. 2. Stable cardiomegaly. 3. Aortic atherosclerosis.     Labs  reviewed:    Lab 10/22/14 0942  NA 135  K 4.3  CL 99  CO2 25  BUN 14  CREATININE 0.94  GLUCOSE 123*   Lab 10/22/14 0942  HGB 13.3  HCT 40.4  WBC 7.7  PLT 244.0     Lab Results  Component Value Date   TSH 2.41 10/22/2014     Lab Results  Component Value Date   PROBNP 994.0* 10/22/2014      Labs ordered / reviewed:  Lab Results  Component Value Date   PROBNP 811.0* 11/04/2014      Lab Results  Component Value Date   ESRSEDRATE 28* 11/04/2014   ESRSEDRATE 55* 10/22/2014            Assessment & Plan:

## 2014-11-04 NOTE — Patient Instructions (Signed)
Please remember to go to the lab  department downstairs for your tests - we will call you with the results when they are available.  Keep appt on 12/04/14 for PFTs

## 2014-11-05 ENCOUNTER — Encounter: Payer: Self-pay | Admitting: Internal Medicine

## 2014-11-05 DIAGNOSIS — J189 Pneumonia, unspecified organism: Secondary | ICD-10-CM | POA: Insufficient documentation

## 2014-11-05 LAB — SEDIMENTATION RATE: Sed Rate: 28 mm/hr — ABNORMAL HIGH (ref 0–22)

## 2014-11-05 NOTE — Assessment & Plan Note (Addendum)
Completely resolved clinically and radiographically / ESR normalizing so clearly this is not amiodarone lung >  no f/u for this problem needed

## 2014-11-05 NOTE — Progress Notes (Signed)
Quick Note:  Spoke with pt and notified of results per Dr. Wert. Pt verbalized understanding and denied any questions.  ______ 

## 2014-11-05 NOTE — Assessment & Plan Note (Addendum)
-   PFTs 05/23/13   VC 3.04 dlco 71 corrects to 84 - PFTs 05/14/14 VC 2.76 dlco 65 corrects to 72  - 10/22/2014  Walked RA x 3 laps @ 185 ft each stopped due to  End of study, no desats   Concerned re recent decline but I think this can all be attributed to RLL pna/not amiodarone, but  needs new set of pfts on return for baseline since still on amiodarone

## 2014-11-10 ENCOUNTER — Encounter: Payer: Self-pay | Admitting: Internal Medicine

## 2014-11-20 ENCOUNTER — Telehealth: Payer: Self-pay | Admitting: Cardiology

## 2014-11-20 DIAGNOSIS — N390 Urinary tract infection, site not specified: Secondary | ICD-10-CM | POA: Diagnosis not present

## 2014-11-20 DIAGNOSIS — R300 Dysuria: Secondary | ICD-10-CM | POA: Diagnosis not present

## 2014-11-20 NOTE — Telephone Encounter (Signed)
New message      Pt is on coumadin.  She has been prescribed nitrofurantoin monomac 100mg  tablets----take 1 tablet every 12hrs.  Is this ok?

## 2014-11-20 NOTE — Telephone Encounter (Signed)
Spoke with pt.  Okay to take nitrofurantoin with Coumadin- no dosing changes needed.

## 2014-11-24 ENCOUNTER — Ambulatory Visit (INDEPENDENT_AMBULATORY_CARE_PROVIDER_SITE_OTHER): Payer: Medicare Other

## 2014-11-24 DIAGNOSIS — I4891 Unspecified atrial fibrillation: Secondary | ICD-10-CM | POA: Diagnosis not present

## 2014-11-24 LAB — POCT INR: INR: 5.1

## 2014-11-25 ENCOUNTER — Encounter: Payer: Self-pay | Admitting: Internal Medicine

## 2014-11-25 ENCOUNTER — Other Ambulatory Visit: Payer: Medicare Other

## 2014-11-25 ENCOUNTER — Ambulatory Visit (INDEPENDENT_AMBULATORY_CARE_PROVIDER_SITE_OTHER): Payer: Medicare Other | Admitting: Internal Medicine

## 2014-11-25 VITALS — BP 112/74 | HR 71 | Temp 97.7°F | Resp 16 | Wt 137.0 lb

## 2014-11-25 DIAGNOSIS — I2510 Atherosclerotic heart disease of native coronary artery without angina pectoris: Secondary | ICD-10-CM | POA: Diagnosis not present

## 2014-11-25 DIAGNOSIS — R300 Dysuria: Secondary | ICD-10-CM

## 2014-11-25 MED ORDER — PHENAZOPYRIDINE HCL 200 MG PO TABS: 200.0000 mg | ORAL_TABLET | Freq: Three times a day (TID) | ORAL | Status: DC | PRN

## 2014-11-25 MED ORDER — AMOXICILLIN-POT CLAVULANATE 875-125 MG PO TABS: 1.0000 | ORAL_TABLET | Freq: Two times a day (BID) | ORAL | Status: DC

## 2014-11-25 NOTE — Patient Instructions (Signed)
Drink as much nondairy fluids as possible. Avoid spicy foods or alcohol as  these may aggravate the bladder. Do not take decongestants. Avoid narcotics if possible. 

## 2014-11-25 NOTE — Progress Notes (Signed)
   Subjective:    Patient ID: Cynthia Frank, female    DOB: 1935/05/24, 79 y.o.   MRN: [id number]  HPI   She was seen at the [facility] and given nitrofurantoin 11/19/14 for dysuria. She completed this 11/24/14. Symptoms initially improved but have recurred. She's concerned as she'll be going out of town in 48 hours.  She has no other constitutional, GU, or gynecologic symptoms.  Review of chart documents a Klebsiella urinary tract infection in October 2013 which was intermediately sensitive to nitrofurantoin. She is on amiodarone as well as warfarin. She's allergic to sulfa.    Review of Systems Pyuria, hematuria, frequency, nocturia or polyuria are denied.  She denies fever, chills, sweats  She has no associated flank pain.  She has no vaginal discharge or bleeding.    Objective:   Physical Exam  Pertinent or positive findings include: She exhibits a halting speech pattern. There is marked deformity with fusiform enlargement of the left ankle.  General appearance :adequately nourished; in no distress.  Eyes: No conjunctival inflammation or scleral icterus is present.  Oral exam:  Lips and gums are healthy appearing.There is no oropharyngeal erythema or exudate noted. Dental hygiene is good.  Heart:  Normal rate and regular rhythm. S1 and S2 normal without gallop, murmur, click, rub or other extra sounds    Lungs:Chest clear to auscultation; no wheezes, rhonchi,rales ,or rubs present.No increased work of breathing.   Abdomen: bowel sounds normal, soft and non-tender without masses, organomegaly or hernias noted.  No guarding or rebound. No flank tenderness to percussion.  Vascular : all pulses equal ; no bruits present.  Skin:Warm & dry.  Intact without suspicious lesions or rashes ; no tenting or jaundice   Lymphatic: No lymphadenopathy is noted about the head, neck, axilla   Neuro: Strength, tone & DTRs normal.         Assessment & Plan:  #1 dysuria,  probable urinary tract infection. Status post nitrofurantoin. Antibiotic options limited due to sulfa allergy and warfarin and amiodarone therapies.  Plan: Pyridium will be prescribed. She'll be given a prescription for Augmentin pending completion of the culture and sensitivity.

## 2014-11-25 NOTE — Progress Notes (Signed)
Pre visit review using our clinic review tool, if applicable. No additional management support is needed unless otherwise documented below in the visit note. 

## 2014-11-28 ENCOUNTER — Other Ambulatory Visit: Payer: Self-pay | Admitting: Cardiology

## 2014-11-29 LAB — CULTURE, URINE COMPREHENSIVE: Colony Count: 30000

## 2014-12-04 ENCOUNTER — Ambulatory Visit (INDEPENDENT_AMBULATORY_CARE_PROVIDER_SITE_OTHER): Payer: Medicare Other | Admitting: Internal Medicine

## 2014-12-04 ENCOUNTER — Ambulatory Visit: Payer: BLUE CROSS/BLUE SHIELD | Admitting: Internal Medicine

## 2014-12-04 ENCOUNTER — Ambulatory Visit (INDEPENDENT_AMBULATORY_CARE_PROVIDER_SITE_OTHER): Payer: Medicare Other | Admitting: Pharmacist

## 2014-12-04 DIAGNOSIS — I4891 Unspecified atrial fibrillation: Secondary | ICD-10-CM

## 2014-12-04 LAB — PULMONARY FUNCTION TEST
DL/VA % pred: 89 %
DL/VA: 4.29 ml/min/mmHg/L
DLCO unc % pred: 74 %
DLCO unc: 18.01 ml/min/mmHg
FEF 25-75 Post: 3.78 L/sec
FEF 25-75 Pre: 2.63 L/sec
FEF2575-%Change-Post: 43 %
FEF2575-%Pred-Post: 242 %
FEF2575-%Pred-Pre: 168 %
FEV1-%Change-Post: 7 %
FEV1-%Pred-Post: 105 %
FEV1-%Pred-Pre: 98 %
FEV1-Post: 2.17 L
FEV1-Pre: 2.01 L
FEV1FVC-%Change-Post: 3 %
FEV1FVC-%Pred-Pre: 117 %
FEV6-%Change-Post: 3 %
FEV6-%Pred-Post: 91 %
FEV6-%Pred-Pre: 88 %
FEV6-Post: 2.39 L
FEV6-Pre: 2.31 L
FEV6FVC-%Pred-Post: 105 %
FEV6FVC-%Pred-Pre: 105 %
FVC-%Change-Post: 3 %
FVC-%Pred-Post: 87 %
FVC-%Pred-Pre: 84 %
FVC-Post: 2.39 L
FVC-Pre: 2.31 L
Post FEV1/FVC ratio: 91 %
Post FEV6/FVC ratio: 100 %
Pre FEV1/FVC ratio: 87 %
Pre FEV6/FVC Ratio: 100 %
RV % pred: 80 %
RV: 1.87 L
TLC % pred: 85 %
TLC: 4.3 L

## 2014-12-04 LAB — POCT INR: INR: 2.4

## 2014-12-04 NOTE — Progress Notes (Signed)
PFT done today. 

## 2014-12-05 DIAGNOSIS — J385 Laryngeal spasm: Secondary | ICD-10-CM | POA: Diagnosis not present

## 2014-12-05 DIAGNOSIS — Z79899 Other long term (current) drug therapy: Secondary | ICD-10-CM | POA: Diagnosis not present

## 2014-12-08 ENCOUNTER — Ambulatory Visit: Payer: BLUE CROSS/BLUE SHIELD | Admitting: Internal Medicine

## 2014-12-25 ENCOUNTER — Telehealth: Payer: Self-pay | Admitting: Internal Medicine

## 2014-12-25 ENCOUNTER — Ambulatory Visit (INDEPENDENT_AMBULATORY_CARE_PROVIDER_SITE_OTHER): Payer: Medicare Other | Admitting: *Deleted

## 2014-12-25 ENCOUNTER — Other Ambulatory Visit: Payer: Medicare Other

## 2014-12-25 ENCOUNTER — Other Ambulatory Visit: Payer: Self-pay | Admitting: Geriatric Medicine

## 2014-12-25 DIAGNOSIS — R300 Dysuria: Secondary | ICD-10-CM | POA: Diagnosis not present

## 2014-12-25 DIAGNOSIS — I4891 Unspecified atrial fibrillation: Secondary | ICD-10-CM | POA: Diagnosis not present

## 2014-12-25 LAB — POCT INR: INR: 3

## 2014-12-25 NOTE — Telephone Encounter (Signed)
Patient came into office requesting a lab order to be placed so she can go to the lab.  Patient thinks she has a UTI.

## 2014-12-25 NOTE — Telephone Encounter (Signed)
Order placed

## 2014-12-28 LAB — CULTURE, URINE COMPREHENSIVE: Colony Count: >=100000

## 2014-12-29 ENCOUNTER — Other Ambulatory Visit: Payer: Self-pay | Admitting: Internal Medicine

## 2014-12-29 MED ORDER — CEPHALEXIN 500 MG PO CAPS: 500.0000 mg | ORAL_CAPSULE | Freq: Three times a day (TID) | ORAL | Status: DC

## 2015-01-15 ENCOUNTER — Other Ambulatory Visit: Payer: Self-pay | Admitting: Cardiology

## 2015-01-19 ENCOUNTER — Other Ambulatory Visit (INDEPENDENT_AMBULATORY_CARE_PROVIDER_SITE_OTHER): Payer: Medicare Other | Admitting: *Deleted

## 2015-01-19 ENCOUNTER — Ambulatory Visit (INDEPENDENT_AMBULATORY_CARE_PROVIDER_SITE_OTHER): Payer: Medicare Other | Admitting: *Deleted

## 2015-01-19 DIAGNOSIS — I4891 Unspecified atrial fibrillation: Secondary | ICD-10-CM | POA: Diagnosis not present

## 2015-01-19 DIAGNOSIS — E785 Hyperlipidemia, unspecified: Secondary | ICD-10-CM

## 2015-01-19 LAB — POCT INR: INR: 3

## 2015-01-19 LAB — HEPATIC FUNCTION PANEL
ALT: 45 U/L — ABNORMAL HIGH (ref 0–35)
AST: 43 U/L — ABNORMAL HIGH (ref 0–37)
Albumin: 3.9 g/dL (ref 3.5–5.2)
Alkaline Phosphatase: 76 U/L (ref 39–117)
Bilirubin, Direct: 0.3 mg/dL (ref 0.0–0.3)
Total Bilirubin: 0.8 mg/dL (ref 0.2–1.2)
Total Protein: 7.1 g/dL (ref 6.0–8.3)

## 2015-01-27 DIAGNOSIS — R300 Dysuria: Secondary | ICD-10-CM | POA: Diagnosis not present

## 2015-01-27 DIAGNOSIS — N3020 Other chronic cystitis without hematuria: Secondary | ICD-10-CM | POA: Diagnosis not present

## 2015-01-27 DIAGNOSIS — R310 Gross hematuria: Secondary | ICD-10-CM | POA: Diagnosis not present

## 2015-01-27 DIAGNOSIS — N390 Urinary tract infection, site not specified: Secondary | ICD-10-CM | POA: Diagnosis not present

## 2015-02-02 DIAGNOSIS — Z23 Encounter for immunization: Secondary | ICD-10-CM | POA: Diagnosis not present

## 2015-02-05 ENCOUNTER — Encounter: Payer: Self-pay | Admitting: Gastroenterology

## 2015-02-05 DIAGNOSIS — N3020 Other chronic cystitis without hematuria: Secondary | ICD-10-CM | POA: Diagnosis not present

## 2015-02-05 DIAGNOSIS — R310 Gross hematuria: Secondary | ICD-10-CM | POA: Diagnosis not present

## 2015-02-05 DIAGNOSIS — R300 Dysuria: Secondary | ICD-10-CM | POA: Diagnosis not present

## 2015-02-11 DIAGNOSIS — J387 Other diseases of larynx: Secondary | ICD-10-CM | POA: Diagnosis not present

## 2015-02-23 ENCOUNTER — Ambulatory Visit (INDEPENDENT_AMBULATORY_CARE_PROVIDER_SITE_OTHER): Payer: Medicare Other | Admitting: *Deleted

## 2015-02-23 DIAGNOSIS — I4891 Unspecified atrial fibrillation: Secondary | ICD-10-CM | POA: Diagnosis not present

## 2015-02-23 LAB — POCT INR: INR: 3.8

## 2015-02-28 ENCOUNTER — Other Ambulatory Visit: Payer: Self-pay | Admitting: Cardiology

## 2015-03-09 ENCOUNTER — Ambulatory Visit (INDEPENDENT_AMBULATORY_CARE_PROVIDER_SITE_OTHER): Payer: Medicare Other

## 2015-03-09 VITALS — BP 100/60 | HR 56 | Ht 65.0 in | Wt 135.2 lb

## 2015-03-09 DIAGNOSIS — Z0000 Encounter for general adult medical examination without abnormal findings: Secondary | ICD-10-CM

## 2015-03-09 DIAGNOSIS — Z23 Encounter for immunization: Secondary | ICD-10-CM

## 2015-03-09 NOTE — Progress Notes (Signed)
Subjective:   Cynthia Frank is a 79 y.o. female who presents for Medicare Annual (Subsequent) preventive examination.  Review of Systems:   HRA assessment completed during visit; The Patient was informed that this wellness visit is to identify risk and educate on how to reduce risk for increase disease through lifestyle changes.   ROS deferred to CPE exam with physician c/p of having a knot at sternum area; feels food come back up; today it is better; chewing gum helps.  Tried medicine she had taken before; Famotidine 20mg  at hs and Omeprazole mag 20.6mg  in the am but not taking either now; was a little better today. Understands may be secondary to meds; fup with internal medicine and cardiology 1st week of Dec.  Recommended she call her GI doctor to better evaluate her symptoms and needs; Denies chest pain, n, v, diaphoresis; States she has had this before and has indigestion. Will fup with GI;   Hx of broken ankle; been 3 years; left ankle still painful but management; does not want more surgery; very careful   Is also ovarian cancer survivor since 48; stage 3; chemo Adriamycin may have caused her atrial fib; as she remembers it starting during treatment;     Atrial fib; medically controlled;  09/19/2014 Dr. Radford Pax following Cardiomyopathy; EF 25 to 30%/ feels tired but states the more she moves, the  Better she feels; states she is not sedentary;  Bp stays low and is low today  Malignant neoplasm ascending colon/ will have colonoscopy in Jan-Feb 2017;   hyperlipidemia 166; Trig 126; 90 hdl; 60 ldl  Labs; glucose 123 10/22/2014;   Family Mother Uterine cancer/ you had ovarian cancer;  Dtr has breast cancer Father prostate; heart disease;   BMI: BMI 22.5  Diet; eggs; bacon, sausage;  Lunch; doesn't eat much; sandwich Supper; meat; vegetables   Exercise; no exercise; but is not sedentary; Is up and moving all day; The more she does, the better she feels  SAFETY/ lives  with husband; / condo with 2nd floor; manages the stairs well.   Safety reviewed for the home; including removal of clutter; clear paths through the home; has a walk in shower; community safe; smoke detectors and firearms safety reviewed; loves condo and in a good location Driving accidents no  Sun protection/ yes  Stressors; Worry about her children; dtr with breast cancer x 2 yo 2 are 87; one is 99; grandchildren;   Memory; plays bridge with friends and this is a great past time. Still wins and no noted issues with memory  Medication review/thinks her meds are may be making her more tired; for fup with physicians soon  Fall assessment/ no falls recently Gait assessment; no issues getting up and down from chair  Mobilization and Functional losses in the last year. No functional losses; cleans; once she gets started she is better  Once she starts walking she is better;Marland Kitchen Sob is more when she gets up   Sleep patterns; states she does not sleep well; Slept 3 hours last pm.   Urinary or fecal incontinence reviewed/dysuria but medication is treating   Advanced Directive; yes; no copy on chart  Counseling: Colonoscopy; 03/15/2013 polyps removed; repeat in 2 years 02/2015 but postponed until January 2017  EKG: 04/04/2014   Hearing checked and has hearing aids   Dexa / had with Norins; osteopenia; this does not worry her; takes calcium and vit d/ would not treat; discussed weight bearing; will enter postponement because she  declined test today  Mammagram 07/10/2014 no evidence of malignancy Ophthalmology exam; q 2 years; due Spring 2017   Immunizations Due  PSV 23/ to take today Tetanus/ will defer;  Zostavax/ had shingles; at health dept; had a long time ago approx. 44; Flu - October at [facility];    Current Care Team reviewed and updated  Cardiac Risk Factors include: advanced age (>68men, >39 women);sedentary lifestyle;dyslipidemiaDr. Radford Pax;  Dr. Fuller Plan; Dr. Melvyn Novas       Objective:     Vitals: BP 100/60 mmHg  Pulse 56  Ht 5\' 5"  (1.651 m)  Wt 135 lb 4 oz (61.349 kg)  BMI 22.51 kg/m2  SpO2 98%  Tobacco History  Smoking status  . Former Smoker -- 0.50 packs/day for 20 years  . Types: Cigarettes  . Quit date: 04/18/1978  Smokeless tobacco  . Not on file     Counseling given: Yes   Past Medical History  Diagnosis Date  . Hx: UTI (urinary tract infection)   . Nonischemic cardiomyopathy ([place])     last EF assessment 40% by MUGA  . History of ovarian cancer 1985  . Partial bowel obstruction ([place])   . Internal hemorrhoid   . Colon cancer ([place]) 03/2006    T3, N0  . Colon polyps 12/07/2010  . Hypertension   . Atrial fibrillation ([place])   . PVC (premature ventricular contraction)   . Coronary artery disease     nonobstructive with 30% D1  . Hyperlipidemia    Past Surgical History  Procedure Laterality Date  . Abdominal hysterectomy    . Oophorectomy      '61  . Tonsillectomy      '44  . Resection of right ovarian cancer '84, repeat '86    . Laparotomy      '80 adhesions  . Resection due to partial bowel obstruction      1985  . Colon surgery      for colon cancer in 2007  . Laparotomy for takedown of intestinal obstruction secondary to adhesions      1989  . Ankle closed reduction  10/16/2011    Procedure: CLOSED REDUCTION ANKLE;  Surgeon: Tobi Bastos, MD;  Location: [facility] ORS;  Service: Orthopedics;  Laterality: Left;  . Colonoscopy N/A 03/11/2013    Procedure: COLONOSCOPY;  Surgeon: Ladene Artist, MD;  Location: [facility] ENDOSCOPY;  Service: Endoscopy;  Laterality: N/A;   Family History  Problem Relation Age of Onset  . Uterine cancer Mother   . Prostate cancer Father   . Heart disease Father   . Heart attack Father   . Colon cancer Neg Hx    History  Sexual Activity  . Sexual Activity: Not on file    Outpatient Encounter Prescriptions as of 03/09/2015  Medication Sig  . amiodarone (PACERONE) 200 MG tablet TAKE 1 TABLET  BY MOUTH DAILY  . atorvastatin (LIPITOR) 40 MG tablet TAKE 1 TABLET BY MOUTH DAILY.  . busPIRone (BUSPAR) 5 MG tablet Take 1 tablet (5 mg total) by mouth daily as needed (anxiety).  . Coenzyme Q10 (COQ10) 100 MG CAPS Take 1 capsule by mouth daily.   Marland Kitchen conjugated estrogens (PREMARIN) vaginal cream Place 1 Applicatorful vaginally daily. (Patient taking differently: Place 1 Applicatorful vaginally daily as needed (vagina). Pt states that she apply the application externally.)  . diclofenac sodium (VOLTAREN) 1 % GEL Apply 2 g topically 4 (four) times daily.  . furosemide (LASIX) 20 MG tablet TAKE 1 TABLET BY MOUTH EVERY DAY  .  ipratropium (ATROVENT) 0.03 % nasal spray Place 2 sprays into both nostrils 3 (three) times daily.  . metoprolol succinate (TOPROL-XL) 25 MG 24 hr tablet TAKE 1 TABLET BY MOUTH EVERY DAY  . nitrofurantoin, macrocrystal-monohydrate, (MACROBID) 100 MG capsule Take 100 mg by mouth at bedtime. Will be on it for 6 months  . olmesartan (BENICAR) 20 MG tablet Take 0.5 tablets (10 mg total) by mouth daily.  . potassium chloride SA (K-DUR,KLOR-CON) 20 MEQ tablet TAKE 2 TABLETS BY MOUTH EVERY MORNING AND 1 EVERY EVENING  . traMADol (ULTRAM) 50 MG tablet Take 1 tablet (50 mg total) by mouth as needed. (Patient taking differently: Take 50 mg by mouth daily as needed (pain). )  . tretinoin (RETIN-A) 0.1 % cream Apply 1 application topically at bedtime.   Marland Kitchen warfarin (COUMADIN) 2.5 MG tablet Take as directed by coumadin clinic  . zolpidem (AMBIEN) 5 MG tablet TAKE 1 TABLET BY MOUTH AT BEDTIME AS NEEDED  . [DISCONTINUED] furosemide (LASIX) 20 MG tablet TAKE 1 TABLET BY MOUTH EVERY DAY   No facility-administered encounter medications on file as of 03/09/2015.    Activities of Daily Living In your present state of health, do you have any difficulty performing the following activities: 03/09/2015  Hearing? N  Vision? N  Difficulty concentrating or making decisions? N  Walking or climbing  stairs? N  Dressing or bathing? N  Doing errands, shopping? N  Preparing Food and eating ? N  Using the Toilet? N  In the past six months, have you accidently leaked urine? N  Do you have problems with loss of bowel control? N  Managing your Medications? N  Managing your Finances? N  Housekeeping or managing your Housekeeping? N    Patient Care Team: [name], MD as PCP - General (Internal Medicine)    Assessment:    Assessment   Patient presents for yearly preventative medicine examination. Medicare questionnaire screening were completed, i.e. Functional; fall risk; depression, memory loss and hearing/ has hearing aids in both ears; no other issued voiced today  All immunizations and health maintenance protocols were reviewed with the patient and needed orders were placed; agreed to take PSV 23 today; Will take Tetanus at a later time; Had flu shot at [facility] and shingles around 42;   Education provided for laboratory screens;    Medication reconciliation, past medical history, social history, problem list and allergies were reviewed in detail with the patient  Goals were established with regard to the patient maintaining her health and positive attitude;   End of life planning was discussed and has been completed    Exercise Activities and Dietary recommendations Current Exercise Habits:: Home exercise routine  Goals    . patient      To maintain health, takes meds and feel better       Fall Risk Fall Risk  03/09/2015  Falls in the past year? No   Depression Screen PHQ 2/9 Scores 03/09/2015  PHQ - 2 Score 0     Cognitive Testing MMSE - Mini Mental State Exam 03/09/2015  Not completed: (No Data)    Immunization History  Administered Date(s) Administered  . Influenza Split 12/17/2013  . Influenza-Unspecified 12/18/2011, 12/27/2012  . Pneumococcal Conjugate-13 03/20/2013  . Pneumococcal Polysaccharide-23 03/09/2015   Screening  Tests Health Maintenance  Topic Date Due  . TETANUS/TDAP  01/05/1957  . ZOSTAVAX  01/05/1998  . DEXA SCAN  01/06/2003  . PNA vac Low Risk Adult (2 of 2 - PPSV23)  03/20/2014  . INFLUENZA VACCINE  11/17/2014  . COLONOSCOPY  03/12/2015      Plan:   Will colonoscopy in 2017; Jan or Feb  Eye exam next year; every other year  Will fup with GI regarding medication for indigestion or stomach issues;   Will take PSV 23  Today  Flu shot taken at [facility] in October;    During the course of the visit the patient was educated and counseled about the following appropriate screening and preventive services:   Vaccines to include Pneumoccal, Influenza, Hepatitis B, Td, Zostavax, HCV/ as stated above  Electrocardiogram/ 04/04/2014  Cardiovascular Disease/ BP low; no recent issue with atrial fib, states she does have CHF and tires easily .  Colorectal cancer screening; repeated q 2 years; will have Jan 2017  Bone density screening/ declines at this time;   Diabetes screening/n/a  Glaucoma screening/ neg; eye exam next year  Mammography/ neg this year  Nutrition counseling / adequate diet; BMI normal;   Patient Instructions (the written plan) was given to the patient.   Wynetta Fines, RN  03/09/2015

## 2015-03-09 NOTE — Patient Instructions (Addendum)
Cynthia Frank , Thank you for taking time to come for your Medicare Wellness Visit. I appreciate your ongoing commitment to your health goals. Please review the following plan we discussed and let me know if I can assist you in the future.   Will colonoscopy in 2017; Jan or Feb  Eye exam next year; every other year  Will fup with GI regarding medication for indigestion or stomach issues;   Will take PSV 23  Today  Flu shot taken at [facility] in October;   Declines dexa;    These are the goals we discussed: Goals    . patient      To maintain health, takes meds and feel better        This is a list of the screening recommended for you and due dates:  Health Maintenance  Topic Date Due  . Tetanus Vaccine  01/05/1957  . Shingles Vaccine  01/05/1998  . DEXA scan (bone density measurement)  01/06/2003  . Pneumonia vaccines (2 of 2 - PPSV23) 03/20/2014  . Flu Shot  11/17/2014  . Colon Cancer Screening  03/12/2015     Health Maintenance, Female Adopting a healthy lifestyle and getting preventive care can go a long way to promote health and wellness. Talk with your health care provider about what schedule of regular examinations is right for you. This is a good chance for you to check in with your provider about disease prevention and staying healthy. In between checkups, there are plenty of things you can do on your own. Experts have done a lot of research about which lifestyle changes and preventive measures are most likely to keep you healthy. Ask your health care provider for more information. WEIGHT AND DIET  Eat a healthy diet  Be sure to include plenty of vegetables, fruits, low-fat dairy products, and lean protein.  Do not eat a lot of foods high in solid fats, added sugars, or salt.  Get regular exercise. This is one of the most important things you can do for your health.  Most adults should exercise for at least 150 minutes each week. The exercise should increase  your heart rate and make you sweat (moderate-intensity exercise).  Most adults should also do strengthening exercises at least twice a week. This is in addition to the moderate-intensity exercise.  Maintain a healthy weight  Body mass index (BMI) is a measurement that can be used to identify possible weight problems. It estimates body fat based on height and weight. Your health care provider can help determine your BMI and help you achieve or maintain a healthy weight.  For females 36 years of age and older:   A BMI below 18.5 is considered underweight.  A BMI of 18.5 to 24.9 is normal.  A BMI of 25 to 29.9 is considered overweight.  A BMI of 30 and above is considered obese.  Watch levels of cholesterol and blood lipids  You should start having your blood tested for lipids and cholesterol at 77 years of age, then have this test every 5 years.  You may need to have your cholesterol levels checked more often if:  Your lipid or cholesterol levels are high.  You are older than 77 years of age.  You are at high risk for heart disease.  CANCER SCREENING   Lung Cancer  Lung cancer screening is recommended for adults 60-60 years old who are at high risk for lung cancer because of a history of smoking.  A yearly  low-dose CT scan of the lungs is recommended for people who:  Currently smoke.  Have quit within the past 15 years.  Have at least a 30-pack-year history of smoking. A pack year is smoking an average of one pack of cigarettes a day for 1 year.  Yearly screening should continue until it has been 15 years since you quit.  Yearly screening should stop if you develop a health problem that would prevent you from having lung cancer treatment.  Breast Cancer  Practice breast self-awareness. This means understanding how your breasts normally appear and feel.  It also means doing regular breast self-exams. Let your health care provider know about any changes, no matter  how small.  If you are in your 20s or 30s, you should have a clinical breast exam (CBE) by a health care provider every 1-3 years as part of a regular health exam.  If you are 59 or older, have a CBE every year. Also consider having a breast X-ray (mammogram) every year.  If you have a family history of breast cancer, talk to your health care provider about genetic screening.  If you are at high risk for breast cancer, talk to your health care provider about having an MRI and a mammogram every year.  Breast cancer gene (BRCA) assessment is recommended for women who have family members with BRCA-related cancers. BRCA-related cancers include:  Breast.  Ovarian.  Tubal.  Peritoneal cancers.  Results of the assessment will determine the need for genetic counseling and BRCA1 and BRCA2 testing. Cervical Cancer Your health care provider may recommend that you be screened regularly for cancer of the pelvic organs (ovaries, uterus, and vagina). This screening involves a pelvic examination, including checking for microscopic changes to the surface of your cervix (Pap test). You may be encouraged to have this screening done every 3 years, beginning at age 13.  For women ages 64-65, health care providers may recommend pelvic exams and Pap testing every 3 years, or they may recommend the Pap and pelvic exam, combined with testing for human papilloma virus (HPV), every 5 years. Some types of HPV increase your risk of cervical cancer. Testing for HPV may also be done on women of any age with unclear Pap test results.  Other health care providers may not recommend any screening for nonpregnant women who are considered low risk for pelvic cancer and who do not have symptoms. Ask your health care provider if a screening pelvic exam is right for you.  If you have had past treatment for cervical cancer or a condition that could lead to cancer, you need Pap tests and screening for cancer for at least 20 years  after your treatment. If Pap tests have been discontinued, your risk factors (such as having a new sexual partner) need to be reassessed to determine if screening should resume. Some women have medical problems that increase the chance of getting cervical cancer. In these cases, your health care provider may recommend more frequent screening and Pap tests. Colorectal Cancer  This type of cancer can be detected and often prevented.  Routine colorectal cancer screening usually begins at 77 years of age and continues through 77 years of age.  Your health care provider may recommend screening at an earlier age if you have risk factors for colon cancer.  Your health care provider may also recommend using home test kits to check for hidden blood in the stool.  A small camera at the end of a tube can  be used to examine your colon directly (sigmoidoscopy or colonoscopy). This is done to check for the earliest forms of colorectal cancer.  Routine screening usually begins at age 63.  Direct examination of the colon should be repeated every 5-10 years through 77 years of age. However, you may need to be screened more often if early forms of precancerous polyps or small growths are found. Skin Cancer  Check your skin from head to toe regularly.  Tell your health care provider about any new moles or changes in moles, especially if there is a change in a mole's shape or color.  Also tell your health care provider if you have a mole that is larger than the size of a pencil eraser.  Always use sunscreen. Apply sunscreen liberally and repeatedly throughout the day.  Protect yourself by wearing long sleeves, pants, a wide-brimmed hat, and sunglasses whenever you are outside. HEART DISEASE, DIABETES, AND HIGH BLOOD PRESSURE   High blood pressure causes heart disease and increases the risk of stroke. High blood pressure is more likely to develop in:  People who have blood pressure in the high end of the  normal range (130-139/85-89 mm Hg).  People who are overweight or obese.  People who are African American.  If you are 78-66 years of age, have your blood pressure checked every 3-5 years. If you are 18 years of age or older, have your blood pressure checked every year. You should have your blood pressure measured twice--once when you are at a hospital or clinic, and once when you are not at a hospital or clinic. Record the average of the two measurements. To check your blood pressure when you are not at a hospital or clinic, you can use:  An automated blood pressure machine at a pharmacy.  A home blood pressure monitor.  If you are between 64 years and 31 years old, ask your health care provider if you should take aspirin to prevent strokes.  Have regular diabetes screenings. This involves taking a blood sample to check your fasting blood sugar level.  If you are at a normal weight and have a low risk for diabetes, have this test once every three years after 77 years of age.  If you are overweight and have a high risk for diabetes, consider being tested at a younger age or more often. PREVENTING INFECTION  Hepatitis B  If you have a higher risk for hepatitis B, you should be screened for this virus. You are considered at high risk for hepatitis B if:  You were born in a country where hepatitis B is common. Ask your health care provider which countries are considered high risk.  Your parents were born in a high-risk country, and you have not been immunized against hepatitis B (hepatitis B vaccine).  You have HIV or AIDS.  You use needles to inject street drugs.  You live with someone who has hepatitis B.  You have had sex with someone who has hepatitis B.  You get hemodialysis treatment.  You take certain medicines for conditions, including cancer, organ transplantation, and autoimmune conditions. Hepatitis C  Blood testing is recommended for:  Everyone born from [birth year]  through 1965.  Anyone with known risk factors for hepatitis C. Sexually transmitted infections (STIs)  You should be screened for sexually transmitted infections (STIs) including gonorrhea and chlamydia if:  You are sexually active and are younger than 77 years of age.  You are older than 77 years of age  and your health care provider tells you that you are at risk for this type of infection.  Your sexual activity has changed since you were last screened and you are at an increased risk for chlamydia or gonorrhea. Ask your health care provider if you are at risk.  If you do not have HIV, but are at risk, it may be recommended that you take a prescription medicine daily to prevent HIV infection. This is called pre-exposure prophylaxis (PrEP). You are considered at risk if:  You are sexually active and do not regularly use condoms or know the HIV status of your partner(s).  You take drugs by injection.  You are sexually active with a partner who has HIV. Talk with your health care provider about whether you are at high risk of being infected with HIV. If you choose to begin PrEP, you should first be tested for HIV. You should then be tested every 3 months for as long as you are taking PrEP.  PREGNANCY   If you are premenopausal and you may become pregnant, ask your health care provider about preconception counseling.  If you may become pregnant, take 400 to 800 micrograms (mcg) of folic acid every day.  If you want to prevent pregnancy, talk to your health care provider about birth control (contraception). OSTEOPOROSIS AND MENOPAUSE   Osteoporosis is a disease in which the bones lose minerals and strength with aging. This can result in serious bone fractures. Your risk for osteoporosis can be identified using a bone density scan.  If you are 25 years of age or older, or if you are at risk for osteoporosis and fractures, ask your health care provider if you should be screened.  Ask your  health care provider whether you should take a calcium or vitamin D supplement to lower your risk for osteoporosis.  Menopause may have certain physical symptoms and risks.  Hormone replacement therapy may reduce some of these symptoms and risks. Talk to your health care provider about whether hormone replacement therapy is right for you.  HOME CARE INSTRUCTIONS   Schedule regular health, dental, and eye exams.  Stay current with your immunizations.   Do not use any tobacco products including cigarettes, chewing tobacco, or electronic cigarettes.  If you are pregnant, do not drink alcohol.  If you are breastfeeding, limit how much and how often you drink alcohol.  Limit alcohol intake to no more than 1 drink per day for nonpregnant women. One drink equals 12 ounces of beer, 5 ounces of wine, or 1 ounces of hard liquor.  Do not use street drugs.  Do not share needles.  Ask your health care provider for help if you need support or information about quitting drugs.  Tell your health care provider if you often feel depressed.  Tell your health care provider if you have ever been abused or do not feel safe at home.   This information is not intended to replace advice given to you by your health care provider. Make sure you discuss any questions you have with your health care provider.   Document Released: 10/18/2010 Document Revised: 04/25/2014 Document Reviewed: 03/06/2013 Elsevier Interactive Patient Education [employer].

## 2015-03-09 NOTE — Progress Notes (Signed)
Medical screening examination/treatment/procedure(s) were performed by non-physician practitioner and as supervising physician I was immediately available for consultation/collaboration. I agree with above. Doshie Maggi A Yarden Hillis, MD 

## 2015-03-10 ENCOUNTER — Telehealth: Payer: Self-pay | Admitting: Gastroenterology

## 2015-03-10 NOTE — Telephone Encounter (Signed)
Patient with terrible chest discomfort after meals.  She reports that she has tried some OTC products with minimal relief.  Patient instructed to maintain an anti-reflux diet. Advised to avoid caffeine, mint, citrus foods/juices, tomatoes,  chocolate, NSAIDS/ASA products.  Instructed not to eat within 2 hours of exercise or bed, multiple small meals are better than 3 large meals.  Need to take PPI 30 minutes prior to 1st meal of the day. She will come in and see Nicoletta Ba PA on Monday  03/16/15

## 2015-03-16 ENCOUNTER — Ambulatory Visit (INDEPENDENT_AMBULATORY_CARE_PROVIDER_SITE_OTHER): Payer: Medicare Other | Admitting: *Deleted

## 2015-03-16 ENCOUNTER — Encounter: Payer: Self-pay | Admitting: Physician Assistant

## 2015-03-16 ENCOUNTER — Telehealth: Payer: Self-pay | Admitting: *Deleted

## 2015-03-16 ENCOUNTER — Ambulatory Visit (INDEPENDENT_AMBULATORY_CARE_PROVIDER_SITE_OTHER): Payer: Medicare Other | Admitting: Physician Assistant

## 2015-03-16 ENCOUNTER — Ambulatory Visit: Payer: Medicare Other | Admitting: Physician Assistant

## 2015-03-16 VITALS — BP 98/70 | HR 64 | Ht 64.0 in | Wt 136.4 lb

## 2015-03-16 DIAGNOSIS — Z1211 Encounter for screening for malignant neoplasm of colon: Secondary | ICD-10-CM | POA: Diagnosis not present

## 2015-03-16 DIAGNOSIS — K219 Gastro-esophageal reflux disease without esophagitis: Secondary | ICD-10-CM | POA: Diagnosis not present

## 2015-03-16 DIAGNOSIS — Z7901 Long term (current) use of anticoagulants: Secondary | ICD-10-CM | POA: Diagnosis not present

## 2015-03-16 DIAGNOSIS — I4891 Unspecified atrial fibrillation: Secondary | ICD-10-CM

## 2015-03-16 DIAGNOSIS — I2510 Atherosclerotic heart disease of native coronary artery without angina pectoris: Secondary | ICD-10-CM

## 2015-03-16 DIAGNOSIS — Z85038 Personal history of other malignant neoplasm of large intestine: Secondary | ICD-10-CM | POA: Diagnosis not present

## 2015-03-16 DIAGNOSIS — Z86010 Personal history of colonic polyps: Secondary | ICD-10-CM

## 2015-03-16 LAB — POCT INR: INR: 3.8

## 2015-03-16 MED ORDER — OMEPRAZOLE 40 MG PO CPDR: DELAYED_RELEASE_CAPSULE | ORAL | Status: DC

## 2015-03-16 MED ORDER — MOVIPREP 100 G PO SOLR: 1.0000 | ORAL | Status: DC

## 2015-03-16 NOTE — Progress Notes (Signed)
Patient ID: Cynthia Frank, female   DOB: [birth year], 79 y.o.   MRN: [id number]   Subjective:    Patient ID: Cynthia Frank, female    DOB: 1935-04-24, 79 y.o.   MRN: [id number]  HPI  Cynthia Frank  is a pleasant 79 year old white female known to Dr. Fuller Plan who has history of right colon cancer. She last had colonoscopy in November 2014 and was found to have 3 sessile polyps all of which were removed and were tubular adenomas. She has an ileo-colonic anastomosis. She was recommended for 2 year follow-up. Patient comes in today to discuss colonoscopy and also has complaints of recent significant heartburn and indigestion. He says her symptoms started about a month ago and had been terrible for about 3 weeks. She finally started on Pepcid before meals and then over-the-counter hilus echo over the past week and says today she feels the best she has felt for several weeks. She complains of Demerol burning type feeling in her chest which is worse after eating. She has no dysphagia or odynophagia. She is also had some burning in her stomach. She is not on any regular NSAIDs and had not started any new meds. She had been taking antibiotics periodically for UTIs and is now on continuous Macrobid. She says she just started that a week ago.  Patient has history of atrial fibrillation for which she is on Coumadin, coronary artery disease and a nonischemic cardiomyopathy with EF of 25-30% documented in 2014. She is followed by Dr. Golden Hurter.  Review of Systems Pertinent positive and negative review of systems were noted in the above HPI section.  All other review of systems was otherwise negative.  Outpatient Encounter Prescriptions as of 03/16/2015  Medication Sig  . amiodarone (PACERONE) 200 MG tablet TAKE 1 TABLET BY MOUTH DAILY  . atorvastatin (LIPITOR) 40 MG tablet TAKE 1 TABLET BY MOUTH DAILY.  . busPIRone (BUSPAR) 5 MG tablet Take 1 tablet (5 mg total) by mouth daily as needed (anxiety).  . Coenzyme  Q10 (COQ10) 100 MG CAPS Take 1 capsule by mouth daily.   Marland Kitchen conjugated estrogens (PREMARIN) vaginal cream Place 1 Applicatorful vaginally daily. (Patient taking differently: Place 1 Applicatorful vaginally daily as needed (vagina). Pt states that she apply the application externally.)  . diclofenac sodium (VOLTAREN) 1 % GEL Apply 2 g topically 4 (four) times daily.  . furosemide (LASIX) 20 MG tablet TAKE 1 TABLET BY MOUTH EVERY DAY  . ipratropium (ATROVENT) 0.03 % nasal spray Place 2 sprays into both nostrils 3 (three) times daily.  . metoprolol succinate (TOPROL-XL) 25 MG 24 hr tablet TAKE 1 TABLET BY MOUTH EVERY DAY  . nitrofurantoin, macrocrystal-monohydrate, (MACROBID) 100 MG capsule Take 100 mg by mouth at bedtime. Will be on it for 6 months  . olmesartan (BENICAR) 20 MG tablet Take 0.5 tablets (10 mg total) by mouth daily.  . potassium chloride SA (K-DUR,KLOR-CON) 20 MEQ tablet TAKE 2 TABLETS BY MOUTH EVERY MORNING AND 1 EVERY EVENING  . traMADol (ULTRAM) 50 MG tablet Take 1 tablet (50 mg total) by mouth as needed. (Patient taking differently: Take 50 mg by mouth daily as needed (pain). )  . tretinoin (RETIN-A) 0.1 % cream Apply 1 application topically at bedtime.   Marland Kitchen warfarin (COUMADIN) 2.5 MG tablet Take as directed by coumadin clinic  . zolpidem (AMBIEN) 5 MG tablet TAKE 1 TABLET BY MOUTH AT BEDTIME AS NEEDED  . MOVIPREP 100 G SOLR Take 1 kit (200 g total) by mouth  as directed.  Marland Kitchen omeprazole (PRILOSEC) 40 MG capsule Take 1 tab by mouth every morning.   No facility-administered encounter medications on file as of 03/16/2015.   Allergies  Allergen Reactions  . Clindamycin/Lincomycin     itching  . Pneumococcal Vaccines Itching and Swelling    Redness   . Sulfonamide Derivatives     REACTION: Itching/Swelling   Patient Active Problem List   Diagnosis Date Noted  . CAP (community acquired pneumonia) 11/05/2014  . Dyspnea 10/22/2014  . Nonischemic cardiomyopathy ([place])   . PVC  (premature ventricular contraction)   . Coronary artery disease   . Hyperlipidemia   . Atrial fibrillation ([place]) 12/11/2012  . CARCINOMA, ASCENDING COLON 09/12/2008  . THYROID NODULE 04/27/2007  . Essential hypertension 04/27/2007   Social History   Social History  . Marital Status: Married    Spouse Name: N/A  . Number of Children: 2  . Years of Education: N/A   Occupational History  . retired    Social History Main Topics  . Smoking status: Former Smoker -- 0.50 packs/day for 20 years    Types: Cigarettes    Quit date: 04/18/1978  . Smokeless tobacco: Not on file  . Alcohol Use: 8.4 oz/week    14 Glasses of wine per week     Comment: has wine before dinner   . Drug Use: No  . Sexual Activity: Not on file   Other Topics Concern  . Not on file   Social History Narrative   Patient had never smoked.   Alcohol use- yes   Daily caffeine use- coffee   Illicit drug use- no   Occupation:retired     Cynthia Frank's family history includes Heart attack in her father; Heart disease in her father; Prostate cancer in her father; Uterine cancer in her mother. There is no history of Colon cancer.      Objective:    Filed Vitals:   03/16/15 1518  BP: 98/70  Pulse: 64    Physical Exam      well-developed elderly white female in no acute distress, quite pleasant blood pressure 98/70 pulse 64 height 5 foot 4 weight 136. HEENT ;nontraumatic normocephalic EOMI PERRLA sclera anicteric, Supple ;no JVD, Cardiovascular; regular rate and rhythm with S1-S2 there's a soft systolic murmur, Pulmonary ;clear bilaterally, Abdomen ;soft, nontender, nondistended bowel sounds are active there is no palpable mass or hepatosplenomegaly on, Extremities ;no clubbing cyanosis or edema skin warm and dry, Neuropsych; mood and affect appropriate       Assessment & Plan:   #1 79 yo female with hx of ascending colon cancer- due for follow up colonoscopy #2 hx of adenomatous polyps- 3 at last  colonoscopy #3 GERD,probable esophagitis - ? abx related  #4 Afib #5 chronic anticoagulation - Coumadin #6 Non ischemic cardiomyopathy- EF 25-30% #7 CAD  Plan; Antireflux regimen  Rx for Prilosec 40 mg po qam  She will continue pepcid 20 mg at hs x one month  Will schedule for Colonoscopy and EGD with Dr Fuller Plan- at [facility]- procedures discussed in detail  with pt and she is agreeable to proceed. She will need to hold coumadin for 5 days prior to procedure . We will communicate with her Cardiologist/ Dr Radford Pax to assure this is reasonable for this pt.   Sharmain Lastra Genia Harold PA-C 03/16/2015   Cc: [name], *

## 2015-03-16 NOTE — Progress Notes (Signed)
Reviewed and agree with management plan.  Malcolm T. Stark, MD FACG 

## 2015-03-16 NOTE — Telephone Encounter (Signed)
03/16/2015   RE: Cynthia Frank DOB: Sep 16, 1935 MRN: [id number]   Dear  Dr. Fransico Him,    We have scheduled the above patient for an endoscopic procedure. Our records show that she is on anticoagulation therapy.   Please advise as to how long the patient may come off her therapy of Coumadin prior to the procedure, which is scheduled for  04-21-2015.  Please fax back/ or route the completed form to [facility] at [phone].   Sincerely,    Amy Esterwood PA-C

## 2015-03-16 NOTE — Telephone Encounter (Signed)
OK to hold coumadin for colonoscopy but need to forward to coumadin clinic for instructions

## 2015-03-16 NOTE — Telephone Encounter (Signed)
To Coumadin Clinic. 

## 2015-03-16 NOTE — Patient Instructions (Addendum)
We sent prescription to [address] for the [facility] for the colonoscopy. Continue Pepcid complete at bedtime. We sent a prescription for Prilosec 40 mg by mouth every morning to: [facility], [address]   Dennis Bast have been scheduled for an endoscopy and colonoscopy. Please follow the written instructions given to you at your visit today. Please pick up your prep supplies at the pharmacy ,[address].  If you use inhalers (even only as needed), please bring them with you on the day of your procedure. Your physician has requested that you go to www.startemmi.com and enter the access code given to you at your visit today. This web site gives a general overview about your procedure. However, you should still follow specific instructions given to you by our office regarding your preparation for the procedure.

## 2015-03-17 NOTE — Telephone Encounter (Signed)
Patient taking Coumadin for afib, CHADS2 score is 3. Ok to hold Coumadin for 5 days prior to procedure and restart Coumadin in the PM on 04/21/15. Will fax to [facility] at [phone].

## 2015-03-18 NOTE — Telephone Encounter (Signed)
Called the patient and left a message for her to stop the Coumadin on 04-16-2015 and she can resume it on 04-22-2015.  Reminded her the colonoscopy procedure is with Dr. Fuller Plan on 04-21-2015.  We received this information from Dr. Fransico Him, and Harland German RN.  LM for the patient to call their office or she can call me if she has any questions at [phone].

## 2015-03-23 ENCOUNTER — Telehealth: Payer: Self-pay | Admitting: *Deleted

## 2015-03-23 NOTE — Telephone Encounter (Signed)
LM  Advising the insurance company did not want to cover the Moviprep. Called to advise the patient we put a sample Moviprep at our front desk.I did ask the patient to pick it up this week. I LM that we only have a few samples so I am asking she pick it up soon.  The patient did ask specifically for Moviprep when we scheduled her procedure.

## 2015-03-24 ENCOUNTER — Encounter: Payer: Self-pay | Admitting: Internal Medicine

## 2015-03-24 ENCOUNTER — Ambulatory Visit (INDEPENDENT_AMBULATORY_CARE_PROVIDER_SITE_OTHER): Payer: Medicare Other | Admitting: Internal Medicine

## 2015-03-24 ENCOUNTER — Other Ambulatory Visit (INDEPENDENT_AMBULATORY_CARE_PROVIDER_SITE_OTHER): Payer: Medicare Other

## 2015-03-24 VITALS — BP 100/80 | HR 62 | Temp 98.4°F | Resp 14 | Ht 64.0 in | Wt 137.0 lb

## 2015-03-24 DIAGNOSIS — R0600 Dyspnea, unspecified: Secondary | ICD-10-CM | POA: Diagnosis not present

## 2015-03-24 DIAGNOSIS — I482 Chronic atrial fibrillation: Secondary | ICD-10-CM

## 2015-03-24 DIAGNOSIS — E785 Hyperlipidemia, unspecified: Secondary | ICD-10-CM | POA: Diagnosis not present

## 2015-03-24 DIAGNOSIS — Z0000 Encounter for general adult medical examination without abnormal findings: Secondary | ICD-10-CM | POA: Diagnosis not present

## 2015-03-24 DIAGNOSIS — I4821 Permanent atrial fibrillation: Secondary | ICD-10-CM

## 2015-03-24 DIAGNOSIS — I4891 Unspecified atrial fibrillation: Secondary | ICD-10-CM

## 2015-03-24 LAB — CBC
HCT: 40.9 % (ref 36.0–46.0)
Hemoglobin: 13.6 g/dL (ref 12.0–15.0)
MCHC: 33.2 g/dL (ref 30.0–36.0)
MCV: 92 fl (ref 78.0–100.0)
Platelets: 156 10*3/uL (ref 150.0–400.0)
RBC: 4.45 Mil/uL (ref 3.87–5.11)
RDW: 14.3 % (ref 11.5–15.5)
WBC: 5.6 10*3/uL (ref 4.0–10.5)

## 2015-03-24 LAB — COMPREHENSIVE METABOLIC PANEL
ALT: 61 U/L — ABNORMAL HIGH (ref 0–35)
AST: 65 U/L — ABNORMAL HIGH (ref 0–37)
Albumin: 3.8 g/dL (ref 3.5–5.2)
Alkaline Phosphatase: 87 U/L (ref 39–117)
BUN: 14 mg/dL (ref 6–23)
CO2: 21 mEq/L (ref 19–32)
Calcium: 9 mg/dL (ref 8.4–10.5)
Chloride: 103 mEq/L (ref 96–112)
Creatinine, Ser: 1.09 mg/dL (ref 0.40–1.20)
GFR: 51.7 mL/min — ABNORMAL LOW (ref >60.00)
Glucose, Bld: 104 mg/dL — ABNORMAL HIGH (ref 70–99)
Potassium: 4.4 mEq/L (ref 3.5–5.1)
Sodium: 135 mEq/L (ref 135–145)
Total Bilirubin: 1.2 mg/dL (ref 0.2–1.2)
Total Protein: 6.8 g/dL (ref 6.0–8.3)

## 2015-03-24 LAB — LIPID PANEL
Cholesterol: 112 mg/dL (ref 0–200)
HDL: 55.9 mg/dL (ref >39.00)
LDL Cholesterol: 45 mg/dL (ref 0–99)
NonHDL: 55.66
Total CHOL/HDL Ratio: 2
Triglycerides: 54 mg/dL (ref 0.0–149.0)
VLDL: 10.8 mg/dL (ref 0.0–40.0)

## 2015-03-24 LAB — BRAIN NATRIURETIC PEPTIDE: Pro B Natriuretic peptide (BNP): 1615 pg/mL — ABNORMAL HIGH (ref 0.0–100.0)

## 2015-03-24 LAB — TSH: TSH: 5.82 u[IU]/mL — ABNORMAL HIGH (ref 0.35–4.50)

## 2015-03-24 LAB — T4, FREE: Free T4: 0.95 ng/dL (ref 0.60–1.60)

## 2015-03-24 NOTE — Progress Notes (Signed)
   Subjective:    Patient ID: Cynthia Frank, female    DOB: 1935/10/06, 79 y.o.   MRN: [id number]  HPI Here for medicare wellness, no new complaints. Please see A/P for status and treatment of chronic medical problems.   Diet: heart healthy  Physical activity: sedentary Depression/mood screen: negative Hearing: intact to whispered voice Visual acuity: grossly normal, performs annual eye exam  ADLs: capable Fall risk: none Home safety: good Cognitive evaluation: intact to orientation, naming, recall and repetition EOL planning: adv directives discussed, in place  I have personally reviewed and have noted 1. The patient's medical and social history - reviewed today no changes 2. Their use of alcohol, tobacco or illicit drugs 3. Their current medications and supplements 4. The patient's functional ability including ADL's, fall risks, home safety risks and hearing or visual impairment. 5. Diet and physical activities 6. Evidence for depression or mood disorders 7. Care team reviewed and updated (available in snapshot)  Review of Systems  Constitutional: Negative for fever, activity change, appetite change, fatigue and unexpected weight change.  HENT: Negative.   Eyes: Negative.   Respiratory: Negative for cough, chest tightness, shortness of breath and wheezing.   Cardiovascular: Negative for chest pain, palpitations and leg swelling.  Gastrointestinal: Negative for nausea, abdominal pain, diarrhea, constipation and abdominal distention.  Musculoskeletal: Negative.   Skin: Negative.   Neurological: Positive for dizziness.  Psychiatric/Behavioral: Negative.       Objective:   Physical Exam  Constitutional: She is oriented to person, place, and time. She appears well-developed and well-nourished.  HENT:  Head: Normocephalic and atraumatic.  Eyes: EOM are normal.  Neck: Normal range of motion. Neck supple.  Cardiovascular: Normal rate and regular rhythm.     Pulmonary/Chest: Effort normal and breath sounds normal. No respiratory distress. She has no wheezes. She has no rales.  Abdominal: Soft. Bowel sounds are normal. She exhibits no distension. There is no tenderness. There is no rebound.  Musculoskeletal: Normal range of motion.  Neurological: She is alert and oriented to person, place, and time. Coordination normal.  Skin: Skin is warm and dry.   Filed Vitals:   03/24/15 0834  BP: 100/80  Pulse: 62  Temp: 98.4 F (36.9 C)  TempSrc: Oral  Resp: 14  Height: 5\' 4"  (1.626 m)  Weight: 137 lb (62.143 kg)  SpO2: 98%      Assessment & Plan:

## 2015-03-24 NOTE — Progress Notes (Signed)
Pre visit review using our clinic review tool, if applicable. No additional management support is needed unless otherwise documented below in the visit note. 

## 2015-03-24 NOTE — Patient Instructions (Signed)
We will check the blood work today so that you will not have to get bloodwork tomorrow.   We are checking several things that could be causing the breathlessness.   I would recommend to work on building your endurance by walking until you need to rest then walking again until you need to rest. Eventually the breathing will do better and you will be able to do more before having to rest.

## 2015-03-24 NOTE — Assessment & Plan Note (Signed)
Checking labs today. Adding several extra as she is having cardiology appointment tomorrow so as not to have 2 blood draws in a row. She is getting colonoscopy in January. Does not want bone density right now as she has known disease already. Completed pneumonia series (reaction with prevnar 13). Declines Tdap today and flu up to date. Counseled her about the need for exercise regularly to build up her endurance. Given 10 year screening recommendations.

## 2015-03-25 ENCOUNTER — Ambulatory Visit (INDEPENDENT_AMBULATORY_CARE_PROVIDER_SITE_OTHER): Payer: Medicare Other | Admitting: Cardiology

## 2015-03-25 ENCOUNTER — Encounter: Payer: Self-pay | Admitting: Cardiology

## 2015-03-25 VITALS — BP 100/72 | HR 60 | Ht 64.0 in | Wt 137.0 lb

## 2015-03-25 DIAGNOSIS — I429 Cardiomyopathy, unspecified: Secondary | ICD-10-CM

## 2015-03-25 DIAGNOSIS — I10 Essential (primary) hypertension: Secondary | ICD-10-CM | POA: Diagnosis not present

## 2015-03-25 DIAGNOSIS — I2510 Atherosclerotic heart disease of native coronary artery without angina pectoris: Secondary | ICD-10-CM | POA: Diagnosis not present

## 2015-03-25 DIAGNOSIS — I2583 Coronary atherosclerosis due to lipid rich plaque: Secondary | ICD-10-CM

## 2015-03-25 DIAGNOSIS — R011 Cardiac murmur, unspecified: Secondary | ICD-10-CM

## 2015-03-25 DIAGNOSIS — I480 Paroxysmal atrial fibrillation: Secondary | ICD-10-CM

## 2015-03-25 DIAGNOSIS — I428 Other cardiomyopathies: Secondary | ICD-10-CM

## 2015-03-25 DIAGNOSIS — I4891 Unspecified atrial fibrillation: Secondary | ICD-10-CM | POA: Diagnosis not present

## 2015-03-25 DIAGNOSIS — E785 Hyperlipidemia, unspecified: Secondary | ICD-10-CM

## 2015-03-25 MED ORDER — FUROSEMIDE 40 MG PO TABS: 40.0000 mg | ORAL_TABLET | Freq: Every day | ORAL | Status: DC

## 2015-03-25 NOTE — Patient Instructions (Addendum)
Medication Instructions:  Your physician has recommended you make the following change in your medication:  1) INCREASE LASIX to 40 mg daily. For the next two days, take 40 mg TWICE DAILY. 2) STOP BENICAR  Labwork: IN ONE WEEK: BMET  Testing/Procedures: Your physician has requested that you have an echocardiogram. Echocardiography is a painless test that uses sound waves to create images of your heart. It provides your doctor with information about the size and shape of your heart and how well your heart's chambers and valves are working. This procedure takes approximately one hour. There are no restrictions for this procedure.  Follow-Up: Your physician recommends that you schedule a follow-up appointment in 3 weeks with a PA or NP.  Your physician wants you to follow-up in: 6 months with Dr. Radford Pax. You will receive a reminder letter in the mail two months in advance. If you don't receive a letter, please call our office to schedule the follow-up appointment.   Any Other Special Instructions Will Be Listed Below (If Applicable).     If you need a refill on your cardiac medications before your next appointment, please call your pharmacy.

## 2015-03-25 NOTE — Addendum Note (Signed)
Addended by: Harland German A on: 03/25/2015 01:26 PM   Modules accepted: Orders

## 2015-03-25 NOTE — Progress Notes (Addendum)
Cardiology Office Note   Date:  03/25/2015   ID:  Cynthia Frank, Cynthia Frank 1935/05/28, MRN [id number]  PCP:  [name], MD    Chief Complaint  Patient presents with  . Coronary Artery Disease      History of Present Illness: Cynthia Frank is a 79y.o. female with a history of Adriamycin induced nonischemic DCM, HTN, nonobstructive CAD, PAF and PVC's who presents today for followup. She is doing well. She denies any chest pain, LE edema, dizziness, palpitations or syncope.She has chronic SOB which is unchanged.  She saw her PCP yesterday for routine PE and she checked a BNP which was elevated at 1200.  She has noticed increased DOE and notices that she cannot keep up with her friends due to [place].  She denies any LE edema.      Past Medical History  Diagnosis Date  . Hx: UTI (urinary tract infection)   . Nonischemic cardiomyopathy ([place])     last EF assessment 40% by MUGA  . History of ovarian cancer 1985  . Partial bowel obstruction ([place])   . Internal hemorrhoid   . Colon cancer ([place]) 03/2006    T3, N0  . Colon polyps 12/07/2010  . Hypertension   . Atrial fibrillation ([place])   . PVC (premature ventricular contraction)   . Coronary artery disease     nonobstructive with 30% D1  . Hyperlipidemia     Past Surgical History  Procedure Laterality Date  . Abdominal hysterectomy    . Oophorectomy      '61  . Tonsillectomy      '44  . Resection of right ovarian cancer '84, repeat '86    . Laparotomy      '80 adhesions  . Resection due to partial bowel obstruction      1985  . Colon surgery      for colon cancer in 2007  . Laparotomy for takedown of intestinal obstruction secondary to adhesions      1989  . Ankle closed reduction  10/16/2011    Procedure: CLOSED REDUCTION ANKLE;  Surgeon: Tobi Bastos, MD;  Location: [facility];  Service: Orthopedics;  Laterality: Left;  . Colonoscopy N/A 03/11/2013    Procedure: COLONOSCOPY;  Surgeon:  Ladene Artist, MD;  Location: [facility];  Service: Endoscopy;  Laterality: N/A;     Current Outpatient Prescriptions  Medication Sig Dispense Refill  . amiodarone (PACERONE) 200 MG tablet TAKE 1 TABLET BY MOUTH DAILY 90 tablet 1  . atorvastatin (LIPITOR) 40 MG tablet TAKE 1 TABLET BY MOUTH DAILY. 90 tablet 1  . busPIRone (BUSPAR) 5 MG tablet Take 1 tablet (5 mg total) by mouth daily as needed (anxiety). 30 tablet 3  . Coenzyme Q10 (COQ10) 100 MG CAPS Take 1 capsule by mouth daily.     Marland Kitchen conjugated estrogens (PREMARIN) vaginal cream Place 1 Applicatorful vaginally daily. (Patient taking differently: Place 1 Applicatorful vaginally daily as needed (vagina). Pt states that she apply the application externally.) 42.5 g 12  . diclofenac sodium (VOLTAREN) 1 % GEL Apply 2 g topically 4 (four) times daily. 100 g 6  . furosemide (LASIX) 20 MG tablet TAKE 1 TABLET BY MOUTH EVERY DAY 90 tablet 0  . ipratropium (ATROVENT) 0.03 % nasal spray Place 2 sprays into both nostrils 3 (three) times daily.    . metoprolol succinate (TOPROL-XL) 25 MG 24 hr tablet  TAKE 1 TABLET BY MOUTH EVERY DAY 90 tablet 2  . nitrofurantoin, macrocrystal-monohydrate, (MACROBID) 100 MG capsule Take 100 mg by mouth at bedtime. Will be on it for 6 months    . olmesartan (BENICAR) 20 MG tablet Take 0.5 tablets (10 mg total) by mouth daily. 45 tablet 3  . omeprazole (PRILOSEC) 40 MG capsule Take 1 tab by mouth every morning. 30 capsule 11  . potassium chloride SA (K-DUR,KLOR-CON) 20 MEQ tablet TAKE 2 TABLETS BY MOUTH EVERY MORNING AND 1 EVERY EVENING 270 tablet 2  . traMADol (ULTRAM) 50 MG tablet Take 1 tablet (50 mg total) by mouth as needed. (Patient taking differently: Take 50 mg by mouth daily as needed (pain). ) 30 tablet 0  . tretinoin (RETIN-A) 0.1 % cream Apply 1 application topically at bedtime.     Marland Kitchen warfarin (COUMADIN) 2.5 MG tablet Take as directed by coumadin clinic 90 tablet 0  . zolpidem (AMBIEN) 5 MG tablet TAKE 1  TABLET BY MOUTH AT BEDTIME AS NEEDED 30 tablet 0  . MOVIPREP 100 G SOLR Take 1 kit (200 g total) by mouth as directed. (Patient not taking: Reported on 03/24/2015) 1 kit 0   No current facility-administered medications for this visit.    Allergies:   Clindamycin/lincomycin; Pneumococcal vaccines; and Sulfonamide derivatives    Social History:  The patient  reports that she quit smoking about 36 years ago. Her smoking use included Cigarettes. She has a 10 pack-year smoking history. She does not have any smokeless tobacco history on file. She reports that she drinks about 8.4 oz of alcohol per week. She reports that she does not use illicit drugs.   Family History:  The patient's family history includes Heart attack in her father; Heart disease in her father; Prostate cancer in her father; Uterine cancer in her mother. There is no history of Colon cancer.    ROS:  Please see the history of present illness.   Otherwise, review of systems are positive for none.   All other systems are reviewed and negative.    PHYSICAL EXAM: VS:  BP 100/72 mmHg  Pulse 60  Ht _0  (1.626 m)  Wt 62.143 kg (137 lb)  BMI 23.50 kg/m2 , BMI Body mass index is 23.5 kg/(m^2). GEN: Well nourished, well developed, in no acute distress HEENT: normal Neck: no JVD, carotid bruits, or masses Cardiac: RRR; no rubs, or gallops,no edema.  2/6 SM at LLSB to apex Respiratory:  clear to auscultation bilaterally, normal work of breathing GI: soft, nontender, nondistended, + BS MS: no deformity or atrophy Skin: warm and dry, no rash Neuro:  Strength and sensation are intact Psych: euthymic mood, full affect   EKG:  EKG was ordered today and showed NSR with nonspecific IVCD    Recent Labs: 03/24/2015: ALT 61*; BUN 14; Creatinine, Ser 1.09; Hemoglobin 13.6; Platelets 156.0; Potassium 4.4; Pro B Natriuretic peptide (BNP) 1615.0*; Sodium 135; TSH 5.82*    Lipid Panel    Component Value Date/Time   CHOL 112 03/24/2015  0935   TRIG 54.0 03/24/2015 0935   HDL 55.90 03/24/2015 0935   CHOLHDL 2 03/24/2015 0935   VLDL 10.8 03/24/2015 0935   LDLCALC 45 03/24/2015 0935      Wt Readings from Last 3 Encounters:  03/25/15 62.143 kg (137 lb)  03/24/15 62.143 kg (137 lb)  03/16/15 61.859 kg (136 lb 6 oz)    ASSESSMENT AND PLAN:   1. Nonischemic DCM secondary to Adriamycin.  Lungs are  clear but she has had increased DOE.  She also has a prominent systolic murmur at the apex c/w MR.  Her BNP was elevated when PCP checked it yesterday.  I have asked her to increase her Lasix to 67m BID for 2 days then 437mdaily.  Check BMET in 1 week.   - continue beta blocker.  I will have her see my PA in 3 weeks to make sure SOB is better. 2. HTN - controlled but on the soft side.  I have instructed her to stop Benicar since we are increasing Lasix and BP is soft.   - continue metoprolol 3. PVC's - continue Metoprolol        4.  PAF - no reoccurence  - continue Amiodarone/metoprolol/warfarin   5. Nonobstructive ASCAD of the diagonal with no angina  - she is not on ASA due to warfarin  6. Dyslipidemia - LDL at goal at 45.  Continue statin. LFTs mildly elevated on last blood work  Will repeat in 4 weeks.    Current medicines are reviewed at length with the patient today.  The patient does not have concerns regarding medicines.  The following changes have been made:  Increase lasix to 4058mID for 2 days then 21m37mily  Labs/ tests ordered today: See above Assessment and Plan No orders of the defined types were placed in this encounter.     Disposition:   FU with me in 6 months  Signed, TURNSueanne Margarita  03/25/2015 9:37 AM    [facility] [id number]  [id number]: [phone]: [phone]

## 2015-03-26 ENCOUNTER — Other Ambulatory Visit: Payer: Self-pay | Admitting: Cardiology

## 2015-03-30 ENCOUNTER — Ambulatory Visit (INDEPENDENT_AMBULATORY_CARE_PROVIDER_SITE_OTHER): Payer: Medicare Other | Admitting: *Deleted

## 2015-03-30 ENCOUNTER — Other Ambulatory Visit (INDEPENDENT_AMBULATORY_CARE_PROVIDER_SITE_OTHER): Payer: Medicare Other | Admitting: *Deleted

## 2015-03-30 DIAGNOSIS — I480 Paroxysmal atrial fibrillation: Secondary | ICD-10-CM

## 2015-03-30 DIAGNOSIS — I4891 Unspecified atrial fibrillation: Secondary | ICD-10-CM

## 2015-03-30 DIAGNOSIS — I10 Essential (primary) hypertension: Secondary | ICD-10-CM

## 2015-03-30 LAB — BASIC METABOLIC PANEL
BUN: 17 mg/dL (ref 7–25)
CO2: 28 mmol/L (ref 20–31)
Calcium: 8.4 mg/dL — ABNORMAL LOW (ref 8.6–10.4)
Chloride: 97 mmol/L — ABNORMAL LOW (ref 98–110)
Creat: 1.02 mg/dL — ABNORMAL HIGH (ref 0.60–0.93)
Glucose, Bld: 84 mg/dL (ref 65–99)
Potassium: 3.4 mmol/L — ABNORMAL LOW (ref 3.5–5.3)
Sodium: 136 mmol/L (ref 135–146)

## 2015-03-30 LAB — POCT INR: INR: 3.3

## 2015-03-31 ENCOUNTER — Telehealth: Payer: Self-pay

## 2015-03-31 DIAGNOSIS — I4891 Unspecified atrial fibrillation: Secondary | ICD-10-CM

## 2015-03-31 DIAGNOSIS — I10 Essential (primary) hypertension: Secondary | ICD-10-CM

## 2015-03-31 MED ORDER — POTASSIUM CHLORIDE CRYS ER 20 MEQ PO TBCR: 40.0000 meq | EXTENDED_RELEASE_TABLET | Freq: Two times a day (BID) | ORAL | Status: DC

## 2015-03-31 NOTE — Telephone Encounter (Signed)
Informed patient of results and verbal understanding expressed.  Instructed patient to INCREASE KDUR to 40 meq BID.  BMET scheduled for next Wednesday. Patient agrees with treatment plan.

## 2015-03-31 NOTE — Telephone Encounter (Signed)
-----   Message from Sueanne Margarita, MD sent at 03/30/2015 10:46 PM EST ----- Increase Kdur to 40 meq BID and recheck BMET in 1 week

## 2015-04-06 ENCOUNTER — Encounter (HOSPITAL_COMMUNITY): Payer: Self-pay | Admitting: *Deleted

## 2015-04-06 ENCOUNTER — Telehealth: Payer: Self-pay

## 2015-04-06 NOTE — Telephone Encounter (Signed)
ERROR

## 2015-04-08 ENCOUNTER — Ambulatory Visit (HOSPITAL_COMMUNITY): Payer: Medicare Other | Attending: Cardiology

## 2015-04-08 ENCOUNTER — Other Ambulatory Visit: Payer: Self-pay

## 2015-04-08 ENCOUNTER — Other Ambulatory Visit (INDEPENDENT_AMBULATORY_CARE_PROVIDER_SITE_OTHER): Payer: Medicare Other | Admitting: *Deleted

## 2015-04-08 DIAGNOSIS — R011 Cardiac murmur, unspecified: Secondary | ICD-10-CM | POA: Insufficient documentation

## 2015-04-08 DIAGNOSIS — I428 Other cardiomyopathies: Secondary | ICD-10-CM

## 2015-04-08 DIAGNOSIS — E785 Hyperlipidemia, unspecified: Secondary | ICD-10-CM | POA: Diagnosis not present

## 2015-04-08 DIAGNOSIS — I4891 Unspecified atrial fibrillation: Secondary | ICD-10-CM

## 2015-04-08 DIAGNOSIS — I429 Cardiomyopathy, unspecified: Secondary | ICD-10-CM | POA: Insufficient documentation

## 2015-04-08 DIAGNOSIS — I480 Paroxysmal atrial fibrillation: Secondary | ICD-10-CM | POA: Diagnosis not present

## 2015-04-08 DIAGNOSIS — I10 Essential (primary) hypertension: Secondary | ICD-10-CM

## 2015-04-08 LAB — BASIC METABOLIC PANEL
BUN: 18 mg/dL (ref 7–25)
CO2: 22 mmol/L (ref 20–31)
Calcium: 9 mg/dL (ref 8.6–10.4)
Chloride: 98 mmol/L (ref 98–110)
Creat: 1 mg/dL — ABNORMAL HIGH (ref 0.60–0.93)
Glucose, Bld: 100 mg/dL — ABNORMAL HIGH (ref 65–99)
Potassium: 4 mmol/L (ref 3.5–5.3)
Sodium: 135 mmol/L (ref 135–146)

## 2015-04-09 ENCOUNTER — Telehealth: Payer: Self-pay

## 2015-04-09 ENCOUNTER — Encounter: Payer: Self-pay | Admitting: Cardiology

## 2015-04-09 DIAGNOSIS — I10 Essential (primary) hypertension: Secondary | ICD-10-CM

## 2015-04-09 MED ORDER — FUROSEMIDE 40 MG PO TABS: 40.0000 mg | ORAL_TABLET | Freq: Two times a day (BID) | ORAL | Status: DC

## 2015-04-09 NOTE — Telephone Encounter (Signed)
This encounter was created in error - please disregard.

## 2015-04-09 NOTE — Telephone Encounter (Signed)
Instructed patient to INCREASE LASIX to 40 mg BID. BMET scheduled for 12/27. Patient agrees with treatment plan.

## 2015-04-09 NOTE — Telephone Encounter (Signed)
-----   Message from Sueanne Margarita, MD sent at 04/09/2015  2:04 PM EST ----- Have her increase lasix to 40mg  BID and stay on that dose and check BMET on 12/27

## 2015-04-09 NOTE — Telephone Encounter (Signed)
Follow Up  ° °Pt returned the call  °

## 2015-04-10 ENCOUNTER — Other Ambulatory Visit: Payer: Self-pay | Admitting: Cardiology

## 2015-04-10 ENCOUNTER — Telehealth: Payer: Self-pay

## 2015-04-10 ENCOUNTER — Other Ambulatory Visit: Payer: Self-pay

## 2015-04-10 ENCOUNTER — Other Ambulatory Visit: Payer: Self-pay | Admitting: Internal Medicine

## 2015-04-10 MED ORDER — ATORVASTATIN CALCIUM 40 MG PO TABS: 40.0000 mg | ORAL_TABLET | Freq: Every day | ORAL | Status: DC

## 2015-04-10 NOTE — Telephone Encounter (Signed)
PA initiated via covermymeds. Key for PA is [id number]

## 2015-04-14 ENCOUNTER — Telehealth: Payer: Self-pay | Admitting: *Deleted

## 2015-04-14 ENCOUNTER — Other Ambulatory Visit: Payer: Self-pay

## 2015-04-14 ENCOUNTER — Other Ambulatory Visit (INDEPENDENT_AMBULATORY_CARE_PROVIDER_SITE_OTHER): Payer: Medicare Other | Admitting: *Deleted

## 2015-04-14 ENCOUNTER — Encounter: Payer: Self-pay | Admitting: Cardiology

## 2015-04-14 ENCOUNTER — Other Ambulatory Visit: Payer: Medicare Other

## 2015-04-14 ENCOUNTER — Ambulatory Visit (INDEPENDENT_AMBULATORY_CARE_PROVIDER_SITE_OTHER): Payer: Medicare Other | Admitting: Cardiology

## 2015-04-14 VITALS — BP 105/70 | HR 64 | Ht 64.0 in | Wt 130.0 lb

## 2015-04-14 DIAGNOSIS — I2510 Atherosclerotic heart disease of native coronary artery without angina pectoris: Secondary | ICD-10-CM

## 2015-04-14 DIAGNOSIS — I428 Other cardiomyopathies: Secondary | ICD-10-CM

## 2015-04-14 DIAGNOSIS — I4891 Unspecified atrial fibrillation: Secondary | ICD-10-CM | POA: Diagnosis not present

## 2015-04-14 DIAGNOSIS — I10 Essential (primary) hypertension: Secondary | ICD-10-CM | POA: Diagnosis not present

## 2015-04-14 DIAGNOSIS — E785 Hyperlipidemia, unspecified: Secondary | ICD-10-CM

## 2015-04-14 DIAGNOSIS — R790 Abnormal level of blood mineral: Secondary | ICD-10-CM

## 2015-04-14 DIAGNOSIS — R011 Cardiac murmur, unspecified: Secondary | ICD-10-CM

## 2015-04-14 DIAGNOSIS — I429 Cardiomyopathy, unspecified: Secondary | ICD-10-CM

## 2015-04-14 DIAGNOSIS — I493 Ventricular premature depolarization: Secondary | ICD-10-CM | POA: Diagnosis not present

## 2015-04-14 DIAGNOSIS — I480 Paroxysmal atrial fibrillation: Secondary | ICD-10-CM

## 2015-04-14 DIAGNOSIS — I2583 Coronary atherosclerosis due to lipid rich plaque: Secondary | ICD-10-CM

## 2015-04-14 LAB — HEPATIC FUNCTION PANEL
ALT: 62 U/L — ABNORMAL HIGH (ref 6–29)
AST: 63 U/L — ABNORMAL HIGH (ref 10–35)
Albumin: 3.8 g/dL (ref 3.6–5.1)
Alkaline Phosphatase: 80 U/L (ref 33–130)
Bilirubin, Direct: 0.2 mg/dL (ref <=0.2)
Indirect Bilirubin: 0.6 mg/dL (ref 0.2–1.2)
Total Bilirubin: 0.8 mg/dL (ref 0.2–1.2)
Total Protein: 7.1 g/dL (ref 6.1–8.1)

## 2015-04-14 LAB — MAGNESIUM: Magnesium: 1 mg/dL — ABNORMAL LOW (ref 1.5–2.5)

## 2015-04-14 LAB — BASIC METABOLIC PANEL
BUN: 21 mg/dL (ref 7–25)
CO2: 25 mmol/L (ref 20–31)
Calcium: 8.2 mg/dL — ABNORMAL LOW (ref 8.6–10.4)
Chloride: 95 mmol/L — ABNORMAL LOW (ref 98–110)
Creat: 1.05 mg/dL — ABNORMAL HIGH (ref 0.60–0.93)
Glucose, Bld: 148 mg/dL — ABNORMAL HIGH (ref 65–99)
Potassium: 3 mmol/L — ABNORMAL LOW (ref 3.5–5.3)
Sodium: 134 mmol/L — ABNORMAL LOW (ref 135–146)

## 2015-04-14 MED ORDER — MAGNESIUM OXIDE 400 MG PO TABS: 400.0000 mg | ORAL_TABLET | Freq: Two times a day (BID) | ORAL | Status: DC

## 2015-04-14 NOTE — Telephone Encounter (Signed)
-----   Message from Isaiah Serge, NP sent at 04/14/2015  4:30 PM EST ----- Mag oxide 400 mg BID for 2 weeks recheck mag level on the 5th of Jan.

## 2015-04-14 NOTE — Progress Notes (Signed)
Cardiology Office Note   Date:  04/14/2015   ID:  Cynthia Frank, Cynthia Frank [birth year], MRN [id number]  PCP:  [name], MD  Cardiologist:  Dr. Radford Pax    Chief Complaint  Patient presents with  . Shortness of Breath    improved      History of Present Illness: Cynthia Frank is a 79 y.o. female who presents for follow up for SOB.   She has a history of Adriamycin induced nonischemic DCM, HTN, nonobstructive CAD, PAF and PVC's.  On last visit she complained of DOE.  Her lasix was increased to 40 mg BID for 2 days then 40 mg daily.  Labs were normal. benicar was stopped with increase of lasix.  Her echo returned with EF now 20-25% previously had been 25-30%. At that point Dr. Radford Pax left her on the 40 of lasix bid.  Also increased her K+.  Today she feels much better.  Able to do holiday shopping and cooking.  She lives in 2 story home and she goes up and down stairs, she is SOB at the top but improves quickly.  No chest pain.  She has appt with Dr. Aundra Dubin in Jan for continued decreased EF.     Past Medical History  Diagnosis Date  . Hx: UTI (urinary tract infection)   . Nonischemic cardiomyopathy ([place])     last EF assessment 40% by MUGA  . History of ovarian cancer 1985  . Partial bowel obstruction ([place])   . Internal hemorrhoid   . Colon cancer ([place]) 03/2006    T3, N0  . Colon polyps 12/07/2010  . Hypertension   . Atrial fibrillation ([place])   . PVC (premature ventricular contraction)   . Coronary artery disease     nonobstructive with 30% D1  . Hyperlipidemia     Past Surgical History  Procedure Laterality Date  . Abdominal hysterectomy    . Oophorectomy      '61  . Tonsillectomy      '44  . Resection of right ovarian cancer '84, repeat '86    . Laparotomy      '80 adhesions  . Resection due to partial bowel obstruction      1985  . Colon surgery      for colon cancer in 2007  . Laparotomy for takedown of intestinal obstruction secondary to  adhesions      1989  . Ankle closed reduction  10/16/2011    Procedure: CLOSED REDUCTION ANKLE;  Surgeon: Tobi Bastos, MD;  Location: [facility];  Service: Orthopedics;  Laterality: Left;  . Colonoscopy N/A 03/11/2013    Procedure: COLONOSCOPY;  Surgeon: Ladene Artist, MD;  Location: [facility];  Service: Endoscopy;  Laterality: N/A;     Current Outpatient Prescriptions  Medication Sig Dispense Refill  . amiodarone (PACERONE) 200 MG tablet TAKE 1 TABLET BY MOUTH DAILY 90 tablet 3  . atorvastatin (LIPITOR) 40 MG tablet Take 1 tablet (40 mg total) by mouth daily. 90 tablet 3  . busPIRone (BUSPAR) 5 MG tablet Take 1 tablet (5 mg total) by mouth daily as needed (anxiety). 30 tablet 3  . Coenzyme Q10 (COQ10) 100 MG CAPS Take 100 mg by mouth daily.     Marland Kitchen conjugated estrogens (PREMARIN) vaginal cream Place 1 Applicatorful vaginally as needed (dyspareunia).    . diclofenac sodium (VOLTAREN) 1 % GEL APPLY 2 GRAMS TOPICALLY 4TIMES DAILY 100 g 6  . famotidine-calcium carbonate-magnesium hydroxide (PEPCID COMPLETE) 10-800-165 MG chewable tablet  Chew 1 tablet by mouth at bedtime.    . furosemide (LASIX) 40 MG tablet Take 1 tablet (40 mg total) by mouth 2 (two) times daily. 90 tablet 3  . ipratropium (ATROVENT) 0.03 % nasal spray Place 2 sprays into both nostrils 2 (two) times daily.     . metoprolol succinate (TOPROL-XL) 25 MG 24 hr tablet TAKE 1 TABLET BY MOUTH EVERY DAY 90 tablet 3  . nitrofurantoin, macrocrystal-monohydrate, (MACROBID) 100 MG capsule Take 100 mg by mouth at bedtime. Will be on it for 6 months    . omeprazole (PRILOSEC) 40 MG capsule Take 40 mg by mouth daily.    . potassium chloride SA (K-DUR,KLOR-CON) 20 MEQ tablet Take 2 tablets (40 mEq total) by mouth 2 (two) times daily. 270 tablet 3  . traMADol (ULTRAM) 50 MG tablet Take 50 mg by mouth every 6 (six) hours as needed for moderate pain.    Marland Kitchen tretinoin (RETIN-A) 0.1 % cream Apply 1 application topically at bedtime.     Marland Kitchen  warfarin (COUMADIN) 2.5 MG tablet Take as directed by coumadin clinic (Patient taking differently: Take 1.25 mg by mouth daily at 6 PM. Take as directed by coumadin clinic) 90 tablet 0  . zolpidem (AMBIEN) 5 MG tablet Take 5 mg by mouth at bedtime as needed for sleep.     No current facility-administered medications for this visit.    Allergies:   Clindamycin/lincomycin; Pneumococcal vaccines; and Sulfonamide derivatives    Social History:  The patient  reports that she quit smoking about 37 years ago. Her smoking use included Cigarettes. She has a 10 pack-year smoking history. She does not have any smokeless tobacco history on file. She reports that she drinks about 8.4 oz of alcohol per week. She reports that she does not use illicit drugs.    ROS:  General:no colds or fevers,  weight down 7 lbs Skin:no rashes or ulcers HEENT:no blurred vision, no congestion CV:see HPI PUL:see HPI GI:no diarrhea constipation or melena, no indigestion GU:no hematuria, no dysuria Neuro:no syncope, no lightheadedness  Wt Readings from Last 3 Encounters:  04/14/15 130 lb (58.968 kg)  03/25/15 137 lb (62.143 kg)  03/24/15 137 lb (62.143 kg)     PHYSICAL EXAM: VS:  BP 105/70 mmHg  Pulse 64  Ht 5\' 4"  (1.626 m)  Wt 130 lb (58.968 kg)  BMI 22.30 kg/m2 , BMI Body mass index is 22.3 kg/(m^2). General:Pleasant affect, NAD Skin:Warm and dry, brisk capillary refill HEENT:normocephalic, sclera clear, mucus membranes moist Neck:supple, no JVD, no bruits  Heart:S1S2 RRR with 2/6 systolic murmur, gallup, rub or click Lungs:clear without rales, rhonchi, or wheezes [id number], non tender, + BS, do not palpate liver spleen or masses Ext:no lower ext edema, 2+ pedal pulses, 2+ radial pulses Neuro:alert and oriented X 3, MAE, follows commands, + facial symmetry    EKG:  EKG is NOT ordered today.  Recent Labs: 03/24/2015: ALT 61*; Hemoglobin 13.6; Platelets 156.0; Pro B Natriuretic peptide (BNP) 1615.0*; TSH  5.82* 04/08/2015: BUN 18; Creat 1.00*; Potassium 4.0; Sodium 135    Lipid Panel    Component Value Date/Time   CHOL 112 03/24/2015 0935   TRIG 54.0 03/24/2015 0935   HDL 55.90 03/24/2015 0935   CHOLHDL 2 03/24/2015 0935   VLDL 10.8 03/24/2015 0935   LDLCALC 45 03/24/2015 0935       Other studies Reviewed: Additional studies/ records that were reviewed today include: Echo.. Study Conclusions  - Left ventricle: The cavity size was mildly  dilated. Wall thickness was normal. Systolic function was severely reduced. The estimated ejection fraction was in the range of 20% to 25%. Diffuse hypokinesis. Doppler parameters are consistent with restrictive physiology, indicative of decreased left ventricular diastolic compliance and/or increased left atrial pressure. - Aortic valve: There was mild regurgitation. - Mitral valve: Calcified annulus. There was moderate regurgitation. - Left atrium: The atrium was severely dilated. - Right ventricle: Systolic function was moderately reduced. - Pulmonary arteries: Systolic pressure was moderately increased. - Pericardium, extracardiac: A trivial pericardial effusion was identified.  Impressions:  - Severe global reduction in LV systolic function; restrictive filling; mild LVE; mild AI; severe LAE; moderate MR; moderately reduced RV function; mild TR; moderately elevated pulmonary pressure.   ASSESSMENT AND PLAN:  1. Nonischemic S/DCM secondary to Adriamycin. Lungs are clear - DOE resolved unless climbing stairs... . - to see Dr. Norva Riffle 23rd.  - continue beta blocker.continue lasix and Kdur BID, follow up bmet today.check mg.    2. HTN - controlled but on the soft side.Benicarwas stopped on last visit with increased lasix.Marland Kitchen - continue metoprolol 3. PVC's - continue Metoprolol   4. PAF - no reoccurence  - continue Amiodarone/metoprolol/warfarin   5. Nonobstructive ASCAD of the diagonal  with no angina  - she is not on ASA due to warfarin  6. Dyslipidemia - LDL at goal at 45. Continue statin. LFTs mildly elevated on last blood work Will repeat in 4 weeks.  She is for colonoscopy on the 3rd of Jan will check with Dr. Radford Pax to get her opinion.   Current medicines are reviewed with the patient today.  The patient Has no concerns regarding medicines.  The following changes have been made:  See above Labs/ tests ordered today include:see above  Disposition:   FU:  see above  Lennie Muckle, NP  04/14/2015 9:43 AM    [facility] [place], [place], [place], [place] Phone: [phone]; Fax: [phone]

## 2015-04-14 NOTE — Patient Instructions (Signed)
Medication Instructions:  None  Labwork: BMP, LFTs and Magnesium today  Testing/Procedures: None  Follow-Up: Keep follow up as previously scheduled with Dr. Aundra Dubin.  Any Other Special Instructions Will Be Listed Below (If Applicable).     If you need a refill on your cardiac medications before your next appointment, please call your pharmacy.

## 2015-04-14 NOTE — Telephone Encounter (Signed)
Spoke with pt and informed her of lab results and new orders per Cecilie Kicks, NP. Pt verbalized understanding and was in agreement with this plan.

## 2015-04-16 ENCOUNTER — Encounter: Payer: Self-pay | Admitting: *Deleted

## 2015-04-19 ENCOUNTER — Other Ambulatory Visit: Payer: Self-pay | Admitting: Cardiology

## 2015-04-21 ENCOUNTER — Ambulatory Visit (HOSPITAL_COMMUNITY): Payer: Medicare Other | Admitting: Certified Registered Nurse Anesthetist

## 2015-04-21 ENCOUNTER — Encounter (HOSPITAL_COMMUNITY): Payer: Self-pay

## 2015-04-21 ENCOUNTER — Encounter (HOSPITAL_COMMUNITY)
Admission: RE | Disposition: A | Discharge status: Home / Self Care | Payer: Self-pay | Source: Ambulatory Visit | Attending: Gastroenterology

## 2015-04-21 ENCOUNTER — Ambulatory Visit (HOSPITAL_COMMUNITY)
Admission: RE | Admit: 2015-04-21 | Discharge: 2015-04-21 | Disposition: A | Discharge status: Home / Self Care | Payer: Medicare Other | Source: Ambulatory Visit | Attending: Gastroenterology | Admitting: Gastroenterology

## 2015-04-21 ENCOUNTER — Telehealth: Payer: Self-pay

## 2015-04-21 DIAGNOSIS — Z79899 Other long term (current) drug therapy: Secondary | ICD-10-CM | POA: Diagnosis not present

## 2015-04-21 DIAGNOSIS — K317 Polyp of stomach and duodenum: Secondary | ICD-10-CM | POA: Diagnosis not present

## 2015-04-21 DIAGNOSIS — E785 Hyperlipidemia, unspecified: Secondary | ICD-10-CM | POA: Insufficient documentation

## 2015-04-21 DIAGNOSIS — Z1211 Encounter for screening for malignant neoplasm of colon: Secondary | ICD-10-CM

## 2015-04-21 DIAGNOSIS — D122 Benign neoplasm of ascending colon: Secondary | ICD-10-CM | POA: Diagnosis not present

## 2015-04-21 DIAGNOSIS — E041 Nontoxic single thyroid nodule: Secondary | ICD-10-CM | POA: Diagnosis not present

## 2015-04-21 DIAGNOSIS — Z86010 Personal history of colonic polyps: Secondary | ICD-10-CM | POA: Insufficient documentation

## 2015-04-21 DIAGNOSIS — I493 Ventricular premature depolarization: Secondary | ICD-10-CM | POA: Insufficient documentation

## 2015-04-21 DIAGNOSIS — I4891 Unspecified atrial fibrillation: Secondary | ICD-10-CM | POA: Diagnosis not present

## 2015-04-21 DIAGNOSIS — K219 Gastro-esophageal reflux disease without esophagitis: Secondary | ICD-10-CM | POA: Diagnosis not present

## 2015-04-21 DIAGNOSIS — I10 Essential (primary) hypertension: Secondary | ICD-10-CM | POA: Diagnosis not present

## 2015-04-21 DIAGNOSIS — I429 Cardiomyopathy, unspecified: Secondary | ICD-10-CM | POA: Insufficient documentation

## 2015-04-21 DIAGNOSIS — Z7901 Long term (current) use of anticoagulants: Secondary | ICD-10-CM | POA: Insufficient documentation

## 2015-04-21 DIAGNOSIS — D125 Benign neoplasm of sigmoid colon: Secondary | ICD-10-CM | POA: Diagnosis not present

## 2015-04-21 DIAGNOSIS — K640 First degree hemorrhoids: Secondary | ICD-10-CM | POA: Insufficient documentation

## 2015-04-21 DIAGNOSIS — Z980 Intestinal bypass and anastomosis status: Secondary | ICD-10-CM | POA: Insufficient documentation

## 2015-04-21 DIAGNOSIS — Z85038 Personal history of other malignant neoplasm of large intestine: Secondary | ICD-10-CM | POA: Diagnosis not present

## 2015-04-21 DIAGNOSIS — I2510 Atherosclerotic heart disease of native coronary artery without angina pectoris: Secondary | ICD-10-CM | POA: Insufficient documentation

## 2015-04-21 DIAGNOSIS — Z87891 Personal history of nicotine dependence: Secondary | ICD-10-CM | POA: Insufficient documentation

## 2015-04-21 DIAGNOSIS — Z79890 Hormone replacement therapy (postmenopausal): Secondary | ICD-10-CM | POA: Insufficient documentation

## 2015-04-21 DIAGNOSIS — R12 Heartburn: Secondary | ICD-10-CM | POA: Diagnosis present

## 2015-04-21 DIAGNOSIS — I428 Other cardiomyopathies: Secondary | ICD-10-CM

## 2015-04-21 HISTORY — PX: ESOPHAGOGASTRODUODENOSCOPY (EGD) WITH PROPOFOL: SHX5813

## 2015-04-21 HISTORY — PX: COLONOSCOPY WITH PROPOFOL: SHX5780

## 2015-04-21 SURGERY — ESOPHAGOGASTRODUODENOSCOPY (EGD) WITH PROPOFOL
Anesthesia: Monitor Anesthesia Care

## 2015-04-21 MED ORDER — SODIUM CHLORIDE 0.9 % IV SOLN: INTRAVENOUS | Status: DC

## 2015-04-21 MED ORDER — PROPOFOL 500 MG/50ML IV EMUL
INTRAVENOUS | Status: DC | PRN
  Administered 2015-04-21: 100 ug/kg/min via INTRAVENOUS

## 2015-04-21 MED ORDER — PROPOFOL 10 MG/ML IV BOLUS
INTRAVENOUS | Status: AC
  Filled 2015-04-21: qty 60

## 2015-04-21 MED ORDER — LIDOCAINE HCL (CARDIAC) 20 MG/ML IV SOLN
INTRAVENOUS | Status: DC | PRN
  Administered 2015-04-21: 50 mg via INTRAVENOUS

## 2015-04-21 MED ORDER — LIDOCAINE HCL (CARDIAC) 20 MG/ML IV SOLN
INTRAVENOUS | Status: AC
  Filled 2015-04-21: qty 5

## 2015-04-21 MED ORDER — ONDANSETRON HCL 4 MG/2ML IJ SOLN
INTRAMUSCULAR | Status: AC
  Filled 2015-04-21: qty 2

## 2015-04-21 MED ORDER — PROPOFOL 10 MG/ML IV BOLUS
INTRAVENOUS | Status: DC | PRN
  Administered 2015-04-21: 30 mg via INTRAVENOUS
  Administered 2015-04-21: 20 mg via INTRAVENOUS

## 2015-04-21 MED ORDER — ONDANSETRON HCL 4 MG/2ML IJ SOLN
INTRAMUSCULAR | Status: DC | PRN
  Administered 2015-04-21: 4 mg via INTRAVENOUS

## 2015-04-21 MED ORDER — LACTATED RINGERS IV SOLN
INTRAVENOUS | Status: DC
  Administered 2015-04-21: 1000 mL via INTRAVENOUS

## 2015-04-21 SURGICAL SUPPLY — 24 items

## 2015-04-21 NOTE — Telephone Encounter (Signed)
-----   Message from Sueanne Margarita, MD sent at 04/18/2015  3:29 PM EST ----- Increase LFTs could be from hepatic congestion - please add on a BNP when she comes in this week for labs

## 2015-04-21 NOTE — Discharge Instructions (Signed)
Colonoscopy A colonoscopy is an exam to look at the entire large intestine (colon). This exam can help find problems such as tumors, polyps, inflammation, and areas of bleeding. The exam takes about 1 hour.  LET [facility] CARE PROVIDER KNOW ABOUT:   Any allergies you have.  All medicines you are taking, including vitamins, herbs, eye drops, creams, and over-the-counter medicines.  Previous problems you or members of your family have had with the use of anesthetics.  Any blood disorders you have.  Previous surgeries you have had.  Medical conditions you have. RISKS AND COMPLICATIONS  Generally, this is a safe procedure. However, as with any procedure, complications can occur. Possible complications include:  Bleeding.  Tearing or rupture of the colon wall.  Reaction to medicines given during the exam.  Infection (rare). BEFORE THE PROCEDURE   Ask your health care provider about changing or stopping your regular medicines.  You may be prescribed an oral bowel prep. This involves drinking a large amount of medicated liquid, starting the day before your procedure. The liquid will cause you to have multiple loose stools until your stool is almost clear or light green. This cleans out your colon in preparation for the procedure.  Do not eat or drink anything else once you have started the bowel prep, unless your health care provider tells you it is safe to do so.  Arrange for someone to drive you home after the procedure. PROCEDURE   You will be given medicine to help you relax (sedative).  You will lie on your side with your knees bent.  A long, flexible tube with a light and camera on the end (colonoscope) will be inserted through the rectum and into the colon. The camera sends video back to a computer screen as it moves through the colon. The colonoscope also releases carbon dioxide gas to inflate the colon. This helps your health care provider see the area better.  During  the exam, your health care provider may take a small tissue sample (biopsy) to be examined under a microscope if any abnormalities are found.  The exam is finished when the entire colon has been viewed. AFTER THE PROCEDURE   Do not drive for 24 hours after the exam.  You may have a small amount of blood in your stool.  You may pass moderate amounts of gas and have mild abdominal cramping or bloating. This is caused by the gas used to inflate your colon during the exam.  Ask when your test results will be ready and how you will get your results. Make sure you get your test results.   This information is not intended to replace advice given to you by your health care provider. Make sure you discuss any questions you have with your health care provider.   Document Released: 04/01/2000 Document Revised: 01/23/2013 Document Reviewed: 12/10/2012 Elsevier Interactive Patient Education 2016 Else[employer] Inc[employer]  Colon Polyps Polyps are lumps of extra tissue growing inside the body. Polyps can grow in the large intestine (colon). Most colon polyps are noncancerous (benign). However, some colon polyps can become cancerous over time. Polyps that are larger than a pea may be harmful. To be safe, caregivers remove and test all polyps. CAUSES  Polyps form when mutations in the genes cause your cells to grow and divide even though no more tissue is needed. RISK FACTORS There are a number of risk factors that can increase your chances of getting colon polyps. They include:  Being older than  50 years.  Family history of colon polyps or colon cancer.  Long-term colon diseases, such as colitis or Crohn disease.  Being overweight.  Smoking.  Being inactive.  Drinking too much alcohol. SYMPTOMS  Most small polyps do not cause symptoms. If symptoms are present, they may include:  Blood in the stool. The stool may look dark red or black.  Constipation or diarrhea that lasts longer than 1  week. DIAGNOSIS People often do not know they have polyps until their caregiver finds them during a regular checkup. Your caregiver can use 4 tests to check for polyps:  Digital rectal exam. The caregiver wears gloves and feels inside the rectum. This test would find polyps only in the rectum.  Barium enema. The caregiver puts a liquid called barium into your rectum before taking X-rays of your colon. Barium makes your colon look white. Polyps are dark, so they are easy to see in the X-ray pictures.  Sigmoidoscopy. A thin, flexible tube (sigmoidoscope) is placed into your rectum. The sigmoidoscope has a light and tiny camera in it. The caregiver uses the sigmoidoscope to look at the last third of your colon.  Colonoscopy. This test is like sigmoidoscopy, but the caregiver looks at the entire colon. This is the most common method for finding and removing polyps. TREATMENT  Any polyps will be removed during a sigmoidoscopy or colonoscopy. The polyps are then tested for cancer. PREVENTION  To help lower your risk of getting more colon polyps:  Eat plenty of fruits and vegetables. Avoid eating fatty foods.  Do not smoke.  Avoid drinking alcohol.  Exercise every day.  Lose weight if recommended by your caregiver.  Eat plenty of calcium and folate. Foods that are rich in calcium include milk, cheese, and broccoli. Foods that are rich in folate include chickpeas, kidney beans, and spinach. HOME CARE INSTRUCTIONS Keep all follow-up appointments as directed by your caregiver. You may need periodic exams to check for polyps. SEEK MEDICAL CARE IF: You notice bleeding during a bowel movement.   This information is not intended to replace advice given to you by your health care provider. Make sure you discuss any questions you have with your health care provider.   Document Released: 12/30/2003 Document Revised: 04/25/2014 Document Reviewed: 06/14/2011 Elsevier Interactive Patient Education  2016 [employer]. Esophagogastroduodenoscopy Esophagogastroduodenoscopy (EGD) is a procedure that is used to examine the lining of the esophagus, stomach, and first part of the small intestine (duodenum). A long, flexible, lighted tube with a camera attached (endoscope) is inserted down the throat to view these organs. This procedure is done to detect problems or abnormalities, such as inflammation, bleeding, ulcers, or growths, in order to treat them. The procedure lasts 5-20 minutes. It is usually an outpatient procedure, but it may need to be performed in a hospital in emergency cases. LET [facility] CARE PROVIDER KNOW ABOUT:  Any allergies you have.  All medicines you are taking, including vitamins, herbs, eye drops, creams, and over-the-counter medicines.  Previous problems you or members of your family have had with the use of anesthetics.  Any blood disorders you have.  Previous surgeries you have had.  Medical conditions you have. RISKS AND COMPLICATIONS Generally, this is a safe procedure. However, problems can occur and include:  Infection.  Bleeding.  Tearing (perforation) of the esophagus, stomach, or duodenum.  Difficulty breathing or not being able to breathe.  Excessive sweating.  Spasms of the larynx.  Slowed heartbeat.  Low blood pressure. BEFORE  THE PROCEDURE  Do not eat or drink anything after midnight on the night before the procedure or as directed by your health care provider.  Do not take your regular medicines before the procedure if your health care provider asks you not to. Ask your health care provider about changing or stopping those medicines.  If you wear dentures, be prepared to remove them before the procedure.  Arrange for someone to drive you home after the procedure. PROCEDURE  A numbing medicine (local anesthetic) may be sprayed in your throat for comfort and to stop you from gagging or coughing.  You will have an IV tube inserted  in a vein in your hand or arm. You will receive medicines and fluids through this tube.  You will be given a medicine to relax you (sedative).  A pain reliever will be given through the IV tube.  A mouth guard may be placed in your mouth to protect your teeth and to keep you from biting on the endoscope.  You will be asked to lie on your left side.  The endoscope will be inserted down your throat and into your esophagus, stomach, and duodenum.  Air will be put through the endoscope to allow your health care provider to clearly view the lining of your esophagus.  The lining of your esophagus, stomach, and duodenum will be examined. During the exam, your health care provider may:  Remove tissue to be examined under a microscope (biopsy) for inflammation, infection, or other medical problems.  Remove growths.  Remove objects (foreign bodies) that are stuck.  Treat any bleeding with medicines or other devices that stop tissues from bleeding (hot cautery, clipping devices).  Widen (dilate) or stretch narrowed areas of your esophagus and stomach.  The endoscope will be withdrawn. AFTER THE PROCEDURE  You will be taken to a recovery area for observation. Your blood pressure, heart rate, breathing rate, and blood oxygen level will be monitored often until the medicines you were given have worn off.  Do not eat or drink anything until the numbing medicine has worn off and your gag reflex has returned. You may choke.  Your health care provider should be able to discuss his or her findings with you. It will take longer to discuss the test results if any biopsies were taken.   This information is not intended to replace advice given to you by your health care provider. Make sure you discuss any questions you have with your health care provider.   Document Released: 08/05/2004 Document Revised: 04/25/2014 Document Reviewed: 03/07/2012 Elsevier Interactive Patient Education [employer].

## 2015-04-21 NOTE — Telephone Encounter (Deleted)
-----   Message from Sueanne Margarita, MD sent at 04/18/2015  3:27 PM EST ----- Please find out if patient had missed any of her meds

## 2015-04-21 NOTE — Op Note (Signed)
[facility] [place], [address]   COLONOSCOPY PROCEDURE REPORT  PATIENT: Cynthia Frank, Cynthia Frank  MR#: [id number] BIRTHDATE: 13-Apr-1936 , 67  yrs. old GENDER: female ENDOSCOPIST: Ladene Artist, MD, [facility] PROCEDURE DATE:  04/21/2015 PROCEDURE:   Colonoscopy, surveillance and Colonoscopy with snare polypectomy First Screening Colonoscopy - Avg.  risk and is 50 yrs.  old or older - No.  Prior Negative Screening - Now for repeat screening. N/A  History of Adenoma - Now for follow-up colonoscopy & has been > or = to 3 yrs.  No.  It has been less than 3 yrs since last colonoscopy.  Other: See Comments  Polyps removed today? Yes ASA CLASS:   Class III INDICATIONS:Surveillance due to prior colonic neoplasia, PH Colon or Rectal Adenocarcinoma, and PH Colon Adenoma. MEDICATIONS: Monitored anesthesia care and Per Anesthesia DESCRIPTION OF PROCEDURE:   After the risks benefits and alternatives of the procedure were thoroughly explained, informed consent was obtained.  The digital rectal exam revealed no abnormalities of the rectum.   The     endoscope was introduced through the anus and advanced to the surgical anastomosis. No adverse events experienced.   The quality of the prep was good. (Suprep was used)  The instrument was then slowly withdrawn as the colon was fully examined. Estimated blood loss is zero unless otherwise noted in this procedure report.    COLON FINDINGS: Six sessile polyps measuring 5-7 mm in size were found in the sigmoid colon (4) and ascending colon (2). Polypectomies were performed with a cold snare.  The resection was complete, the polyp tissue was completely retrieved and sent to histology.  There was evidence of a normal appearing prior surgical anastomosis in the ascending colon.   The examination was otherwise normal.  Retroflexed views revealed internal Grade I hemorrhoids. The time to cecum = 3.4 Withdrawal time = 14.1   The  scope was withdrawn and the procedure completed. COMPLICATIONS: There were no immediate complications.  ENDOSCOPIC IMPRESSION: 1.   Six sessile polyps in the sigmoid colon and ascending colon; polypectomies performed with a cold snare 2.   Prior surgical anastomosis in the ascending colon 3.   Grade l internal hemorrhoids  RECOMMENDATIONS: 1.  Await pathology results 2.  Hold Aspirin and all other NSAIDS for 2 weeks. 3.  Repeat Colonoscopy in 2 years pending path review 4.  Resume Coumadin (warfarin) in 2 days and have your PT/INR checked within 1 week.  eSigned:  Ladene Artist, MD, [facility] 04/21/2015 11:27 AM

## 2015-04-21 NOTE — H&P (Addendum)
HPI This is a pleasant 79-year-old white female who has history of right colon cancer. She last had colonoscopy in November 2014 and was found to have 3 sessile polyps all of which were removed and were tubular adenomas. She has an ileo-colonic anastomosis. She was recommended for 2 year follow-up. Patient comes in today to discuss colonoscopy and also has complaints of recent significant heartburn and indigestion. He says her symptoms started about a month ago and had been terrible for about 3 weeks. She started on Pepcid before meals and then over-the-counter Prilosec over the past week and says today she feels the best she has felt for several weeks. She complains of burning type feeling in her chest which is worse after eating. She has no dysphagia or odynophagia. She is also had some burning in her stomach. She is not on any regular NSAIDs and had not started any new meds. She had been taking antibiotics periodically for UTIs and is now on continuous Macrobid. She says she just started that a week ago.  Patient has history of atrial fibrillation for which she is on Coumadin, coronary artery disease and a nonischemic cardiomyopathy with EF of 25-30% documented in 2014. She is followed by Dr. Tracy Turner.  Review of Systems Pertinent positive and negative review of systems were noted in the above HPI section. All other review of systems was otherwise negative.   Outpatient Encounter Prescriptions as of 03/16/2015  Medication Sig  . amiodarone (PACERONE) 200 MG tablet TAKE 1 TABLET BY MOUTH DAILY  . atorvastatin (LIPITOR) 40 MG tablet TAKE 1 TABLET BY MOUTH DAILY.  . busPIRone (BUSPAR) 5 MG tablet Take 1 tablet (5 mg total) by mouth daily as needed (anxiety).  . Coenzyme Q10 (COQ10) 100 MG CAPS Take 1 capsule by mouth daily.   . conjugated estrogens (PREMARIN) vaginal cream Place 1 Applicatorful vaginally daily. (Patient taking differently: Place 1 Applicatorful vaginally daily as  needed (vagina). Pt states that she apply the application externally.)  . diclofenac sodium (VOLTAREN) 1 % GEL Apply 2 g topically 4 (four) times daily.  . furosemide (LASIX) 20 MG tablet TAKE 1 TABLET BY MOUTH EVERY DAY  . ipratropium (ATROVENT) 0.03 % nasal spray Place 2 sprays into both nostrils 3 (three) times daily.  . metoprolol succinate (TOPROL-XL) 25 MG 24 hr tablet TAKE 1 TABLET BY MOUTH EVERY DAY  . nitrofurantoin, macrocrystal-monohydrate, (MACROBID) 100 MG capsule Take 100 mg by mouth at bedtime. Will be on it for 6 months  . olmesartan (BENICAR) 20 MG tablet Take 0.5 tablets (10 mg total) by mouth daily.  . potassium chloride SA (K-DUR,KLOR-CON) 20 MEQ tablet TAKE 2 TABLETS BY MOUTH EVERY MORNING AND 1 EVERY EVENING  . traMADol (ULTRAM) 50 MG tablet Take 1 tablet (50 mg total) by mouth as needed. (Patient taking differently: Take 50 mg by mouth daily as needed (pain). )  . tretinoin (RETIN-A) 0.1 % cream Apply 1 application topically at bedtime.   . warfarin (COUMADIN) 2.5 MG tablet Take as directed by coumadin clinic  . zolpidem (AMBIEN) 5 MG tablet TAKE 1 TABLET BY MOUTH AT BEDTIME AS NEEDED  . MOVIPREP 100 G SOLR Take 1 kit (200 g total) by mouth as directed.  . omeprazole (PRILOSEC) 40 MG capsule Take 1 tab by mouth every morning.   No facility-administered encounter medications on file as of 03/16/2015.   Allergies  Allergen Reactions  . Clindamycin/Lincomycin     itching  . Pneumococcal Vaccines Itching and Swelling      Redness   . Sulfonamide Derivatives     REACTION: Itching/Swelling   Patient Active Problem List   Diagnosis Date Noted  . CAP (community acquired pneumonia) 11/05/2014  . Dyspnea 10/22/2014  . Nonischemic cardiomyopathy (HCC)   . PVC (premature ventricular contraction)   . Coronary artery disease   . Hyperlipidemia   . Atrial fibrillation (HCC) 12/11/2012   . CARCINOMA, ASCENDING COLON 09/12/2008  . THYROID NODULE 04/27/2007  . Essential hypertension 04/27/2007   Social History   Social History  . Marital Status: Married    Spouse Name: N/A  . Number of Children: 2  . Years of Education: N/A   Occupational History  . retired    Social History Main Topics  . Smoking status: Former Smoker -- 0.50 packs/day for 20 years    Types: Cigarettes    Quit date: 04/18/1978  . Smokeless tobacco: Not on file  . Alcohol Use: 8.4 oz/week    14 Glasses of wine per week     Comment: has wine before dinner   . Drug Use: No  . Sexual Activity: Not on file   Other Topics Concern  . Not on file   Social History Narrative   Patient had never smoked.   Alcohol use- yes   Daily caffeine use- coffee   Illicit drug use- no   Occupation:retired     Ms. Wiker's family history includes Heart attack in her father; Heart disease in her father; Prostate cancer in her father; Uterine cancer in her mother. There is no history of Colon cancer.      Objective:    Filed Vitals:   03/16/15 1518  BP: 98/70  Pulse: 64   Physical Exam: Well-developed elderly white female in no acute distress, quite pleasant blood pressure 98/70 pulse 64 height 5 foot 4 weight 136. HEENT ;nontraumatic normocephalic EOMI PERRLA sclera anicteric, Supple ;no JVD, Cardiovascular; regular rate and rhythm with S1-S2 there's a soft systolic murmur, Pulmonary ;clear bilaterally, Abdomen ;soft, nontender, nondistended bowel sounds are active there is no palpable mass or hepatosplenomegaly on, Extremities ;no clubbing cyanosis or edema skin warm and dry, Neuropsych; mood and affect appropriate     Assessment & Plan:   #1 79 yo female with hx of ascending colon cancer- due for follow up colonoscopy #2 hx of adenomatous polyps- 3 at last colonoscopy #3 GERD, probable esophagitis - ? abx  related  #4 Afib #5 chronic anticoagulation - Coumadin #6 Non ischemic cardiomyopathy- EF 25-30% #7 CAD  Plan:  Antireflux regimen Prilosec 40 mg po qam Continue Pepcid 20 mg at hs x one month [facility]pro[facility] discussed in detailwith pt and she is agreeable to proceed. She will need to hold coumadin for 5 days prior to procedure. We will communicate with her Cardiologist, Dr. Turner to assure this is reasonable for this pt.         

## 2015-04-21 NOTE — Anesthesia Postprocedure Evaluation (Signed)
Anesthesia Post Note  Patient: OMAH DELAHOUSSAYE  Procedure(s) Performed: Procedure(s) (LRB): ESOPHAGOGASTRODUODENOSCOPY (EGD) WITH PROPOFOL (N/A) COLONOSCOPY WITH PROPOFOL (N/A)  Patient location during evaluation: PACU Anesthesia Type: MAC Level of consciousness: awake and alert Pain management: pain level controlled Vital Signs Assessment: post-procedure vital signs reviewed and stable Respiratory status: spontaneous breathing, nonlabored ventilation, respiratory function stable and patient connected to nasal cannula oxygen Cardiovascular status: stable and blood pressure returned to baseline Anesthetic complications: no    Last Vitals:  Filed Vitals:   04/21/15 1200 04/21/15 1210  BP: 106/68 114/81  Pulse: 59 62  Temp:    Resp: 15 16    Last Pain: There were no vitals filed for this visit.               Laiden Milles J

## 2015-04-21 NOTE — Transfer of Care (Signed)
Immediate Anesthesia Transfer of Care Note  Patient: Cynthia Frank  Procedure(s) Performed: Procedure(s): ESOPHAGOGASTRODUODENOSCOPY (EGD) WITH PROPOFOL (N/A) COLONOSCOPY WITH PROPOFOL (N/A)  Patient Location: ENDO  Anesthesia Type:MAC  Level of Consciousness:  sedated, patient cooperative and responds to stimulation  Airway & Oxygen Therapy:Patient Spontanous Breathing and Patient connected to face mask oxgen  Post-op Assessment:  Report given to ENDO RN and Post -op Vital signs reviewed and stable  Post vital signs:  Reviewed and stable  Last Vitals:  Filed Vitals:   04/21/15 1018  BP: 113/72  Pulse: 62  Temp: 36.5 C  Resp: 12    Complications: No apparent anesthesia complications

## 2015-04-21 NOTE — Anesthesia Preprocedure Evaluation (Signed)
Anesthesia Evaluation  Patient identified by MRN, date of birth, ID band Patient awake    Reviewed: Allergy & Precautions, NPO status , Patient's Chart, lab work & pertinent test results  Airway Mallampati: II  TM Distance: >3 FB Neck ROM: Full    Dental no notable dental hx.    Pulmonary shortness of breath and with exertion, pneumonia, resolved, former smoker,    Pulmonary exam normal breath sounds clear to auscultation       Cardiovascular hypertension, Pt. on medications and Pt. on home beta blockers + CAD  Normal cardiovascular exam Rhythm:Regular Rate:Normal  Cardiomyopathy. EF 40%   Neuro/Psych negative neurological ROS  negative psych ROS   GI/Hepatic Neg liver ROS, GERD  Medicated,  Endo/Other  negative endocrine ROS  Renal/GU negative Renal ROS  negative genitourinary   Musculoskeletal negative musculoskeletal ROS (+)   Abdominal   Peds negative pediatric ROS (+)  Hematology negative hematology ROS (+)   Anesthesia Other Findings   Reproductive/Obstetrics negative OB ROS                             Anesthesia Physical Anesthesia Plan  ASA: III  Anesthesia Plan: MAC   Post-op Pain Management:    Induction: Intravenous  Airway Management Planned: Natural Airway  Additional Equipment:   Intra-op Plan:   Post-operative Plan:   Informed Consent: I have reviewed the patients History and Physical, chart, labs and discussed the procedure including the risks, benefits and alternatives for the proposed anesthesia with the patient or authorized representative who has indicated his/her understanding and acceptance.   Dental advisory given  Plan Discussed with: CRNA  Anesthesia Plan Comments:         Anesthesia Quick Evaluation

## 2015-04-21 NOTE — Op Note (Signed)
[facility] [place], [address]   ENDOSCOPY PROCEDURE REPORT  PATIENT: Cynthia Frank, Cynthia Frank  MR#: [id number] BIRTHDATE: Feb 09, 1936 , 56  yrs. old GENDER: female ENDOSCOPIST: Ladene Artist, MD, [facility] PROCEDURE DATE:  04/21/2015 PROCEDURE:  EGD, diagnostic ASA CLASS:     Class III INDICATIONS:  history of esophageal reflux. MEDICATIONS: Monitored anesthesia care, Residual sedation present, and Per Anesthesia TOPICAL ANESTHETIC: none DESCRIPTION OF PROCEDURE: After the risks benefits and alternatives of the procedure were thoroughly explained, informed consent was obtained.  The Pentax Gastroscope [id number] endoscope was introduced through the mouth and advanced to the second portion of the duodenum , Without limitations.  The instrument was slowly withdrawn as the mucosa was fully examined.    STOMACH: A few sessile polyps measuring 3 mm in size were found in the gastric fundus. They appeared typical for benign fundic gland polyps and with pt resuming Coumadin biopsies were deferred.  The stomach otherwise appeared normal. ESOPHAGUS: The mucosa of the esophagus appeared normal. DUODENUM: The duodenal mucosa showed no abnormalities in the bulb and 2nd part of the duodenum.  Retroflexed views revealed no abnormalities.   The scope was then withdrawn from the patient and the procedure completed.  COMPLICATIONS: There were no immediate complications.  ENDOSCOPIC IMPRESSION: 1.   Few small sessile polyps in the gastric fundus 2.   The EGD otherwise appeared normal  RECOMMENDATIONS: 1.  Anti-reflux regimen 2.  Continue PPI  eSigned:  Ladene Artist, MD, [facility] 04/21/2015 11:38 AM

## 2015-04-21 NOTE — Telephone Encounter (Signed)
BNP ordered to be drawn with labs this week.

## 2015-04-22 ENCOUNTER — Encounter (HOSPITAL_COMMUNITY): Payer: Self-pay | Admitting: Gastroenterology

## 2015-04-22 ENCOUNTER — Encounter: Payer: Self-pay | Admitting: Gastroenterology

## 2015-04-23 ENCOUNTER — Other Ambulatory Visit (INDEPENDENT_AMBULATORY_CARE_PROVIDER_SITE_OTHER): Payer: Medicare Other | Admitting: *Deleted

## 2015-04-23 DIAGNOSIS — I428 Other cardiomyopathies: Secondary | ICD-10-CM

## 2015-04-23 DIAGNOSIS — R790 Abnormal level of blood mineral: Secondary | ICD-10-CM

## 2015-04-23 DIAGNOSIS — I10 Essential (primary) hypertension: Secondary | ICD-10-CM

## 2015-04-23 DIAGNOSIS — I429 Cardiomyopathy, unspecified: Secondary | ICD-10-CM | POA: Diagnosis not present

## 2015-04-23 LAB — BASIC METABOLIC PANEL
BUN: 17 mg/dL (ref 7–25)
CO2: 24 mmol/L (ref 20–31)
Calcium: 8.7 mg/dL (ref 8.6–10.4)
Chloride: 97 mmol/L — ABNORMAL LOW (ref 98–110)
Creat: 1.14 mg/dL — ABNORMAL HIGH (ref 0.60–0.93)
Glucose, Bld: 115 mg/dL — ABNORMAL HIGH (ref 65–99)
Potassium: 4.2 mmol/L (ref 3.5–5.3)
Sodium: 133 mmol/L — ABNORMAL LOW (ref 135–146)

## 2015-04-23 LAB — BRAIN NATRIURETIC PEPTIDE: Brain Natriuretic Peptide: 855 pg/mL — ABNORMAL HIGH (ref 0.0–100.0)

## 2015-04-23 LAB — MAGNESIUM: Magnesium: 1.6 mg/dL (ref 1.5–2.5)

## 2015-04-24 ENCOUNTER — Telehealth: Payer: Self-pay | Admitting: *Deleted

## 2015-04-24 NOTE — Telephone Encounter (Signed)
Pt notified of lab results by phone with verbal understanding. Pt asked does she need to stay on Magnesium 400 mg BID; that Dr. Radford Pax started 12/27. I advised pt that she is just at 1.6 now. I advised stay on Mag and I will have Dr. Theodosia Blender RN cb with recommendation from Dr. Radford Pax.

## 2015-04-30 ENCOUNTER — Ambulatory Visit (INDEPENDENT_AMBULATORY_CARE_PROVIDER_SITE_OTHER): Payer: Medicare Other

## 2015-04-30 DIAGNOSIS — I4891 Unspecified atrial fibrillation: Secondary | ICD-10-CM

## 2015-04-30 LAB — POCT INR: INR: 1.3

## 2015-05-08 ENCOUNTER — Ambulatory Visit (INDEPENDENT_AMBULATORY_CARE_PROVIDER_SITE_OTHER): Payer: Medicare Other | Admitting: *Deleted

## 2015-05-08 DIAGNOSIS — I4891 Unspecified atrial fibrillation: Secondary | ICD-10-CM | POA: Diagnosis not present

## 2015-05-08 LAB — POCT INR: INR: 2

## 2015-05-11 ENCOUNTER — Encounter (HOSPITAL_COMMUNITY): Payer: Self-pay

## 2015-05-11 ENCOUNTER — Ambulatory Visit (HOSPITAL_COMMUNITY)
Admission: RE | Admit: 2015-05-11 | Discharge: 2015-05-11 | Disposition: A | Discharge status: Home / Self Care | Payer: Medicare Other | Source: Ambulatory Visit | Attending: Cardiology | Admitting: Cardiology

## 2015-05-11 VITALS — BP 100/58 | HR 66 | Wt 131.2 lb

## 2015-05-11 DIAGNOSIS — I5022 Chronic systolic (congestive) heart failure: Secondary | ICD-10-CM | POA: Diagnosis not present

## 2015-05-11 DIAGNOSIS — I480 Paroxysmal atrial fibrillation: Secondary | ICD-10-CM | POA: Insufficient documentation

## 2015-05-11 DIAGNOSIS — I428 Other cardiomyopathies: Secondary | ICD-10-CM | POA: Diagnosis not present

## 2015-05-11 DIAGNOSIS — Z87891 Personal history of nicotine dependence: Secondary | ICD-10-CM | POA: Insufficient documentation

## 2015-05-11 DIAGNOSIS — Z8543 Personal history of malignant neoplasm of ovary: Secondary | ICD-10-CM | POA: Insufficient documentation

## 2015-05-11 DIAGNOSIS — I110 Hypertensive heart disease with heart failure: Secondary | ICD-10-CM | POA: Insufficient documentation

## 2015-05-11 DIAGNOSIS — I429 Cardiomyopathy, unspecified: Secondary | ICD-10-CM

## 2015-05-11 DIAGNOSIS — Z8249 Family history of ischemic heart disease and other diseases of the circulatory system: Secondary | ICD-10-CM | POA: Diagnosis not present

## 2015-05-11 DIAGNOSIS — Z7901 Long term (current) use of anticoagulants: Secondary | ICD-10-CM | POA: Diagnosis not present

## 2015-05-11 DIAGNOSIS — Z79899 Other long term (current) drug therapy: Secondary | ICD-10-CM | POA: Insufficient documentation

## 2015-05-11 DIAGNOSIS — K219 Gastro-esophageal reflux disease without esophagitis: Secondary | ICD-10-CM | POA: Diagnosis not present

## 2015-05-11 DIAGNOSIS — Z95810 Presence of automatic (implantable) cardiac defibrillator: Secondary | ICD-10-CM | POA: Diagnosis not present

## 2015-05-11 DIAGNOSIS — Z9221 Personal history of antineoplastic chemotherapy: Secondary | ICD-10-CM | POA: Diagnosis not present

## 2015-05-11 MED ORDER — SPIRONOLACTONE 25 MG PO TABS: 12.5000 mg | ORAL_TABLET | Freq: Every evening | ORAL | Status: DC

## 2015-05-11 MED ORDER — LOSARTAN POTASSIUM 25 MG PO TABS: 12.5000 mg | ORAL_TABLET | Freq: Every morning | ORAL | Status: DC

## 2015-05-11 NOTE — Patient Instructions (Addendum)
Start Losartan 12.5 mg (1/2 tablet) every morning.  Start Spironolactone12.5 mg (1/2 tablet) every evening.  Follow up: 2-3 weeks with Dr.McLean  Labs: cmet bnp tsh cbc ( 1 week)  Your provider requests you have a cardiac MRI before your follow up appointment.

## 2015-05-11 NOTE — Progress Notes (Signed)
Patient ID: Cynthia Frank, female   DOB: 1935/11/02, 80 y.o.   MRN: [id number] PCP: Dr. Sharlet Salina Cardiology: Dr. Radford Pax HF Cardiology: Dr. Aundra Dubin  80 yo with history of chronic systolic CHF/nonischemic cardiomyopathy, paroxysmal atrial fibrillation, and prior ovarian and colon cancers presents for evaluation of CHF. She has had a cardiomyopathy known for about 15 years now. Cardiac cath at diagnosis showed mild nonobstructive disease.  EF has been persistently low.  Cause has been thought to be Adriamycin; she received this as part of her ovarian cancer treatment. She has had paroxysmal atrial fibrillation and has remained in NSR with amiodarone use.  No recent atrial fibrillation symptoms, typically feels prolonged palpitations thought to be atrial fibrillation 2-3 times/year.  She is in NSR today.   She has had dyspnea with moderate to heavy exertion for 4-5 years now.  However, over the last couple of months, her symptoms have worsened.  About a month ago, she was short of breath walking short distances on flat ground.  Lasix was increased to 40 mg bid and she has lost weight and feels better.   Currently, she is short of breath walking up steps but does ok on flat ground.  She gets a little short of breath taking a shower.  No problems with dressing and housework.  She has no orthopnea/PND.  No cough or chest pain.  She is now off Benicar because BP has been running lower.    ECG: NSR, IVCD (164 msec), 1st degree AV block  Labs (1/17): K 4.2, creatinine 1.14, BNP 855  PMH: 1. Chronic systolic CHF: Nonischemic cardiomyopathy, thought to be related to adriamycin that she had for treatment of ovarian cancer.  NICM diagnosed ~ 2001.  - LHC ~ 2001 with 30% stenosis D1.   - Echo (2013) EF 25-30%.  - Echo (10/14) with EF 25-30%. - Echo (12/16) with EF 20-25%, mild LV dilation, restrictive diastolic function, moderate MR, severe LAE. 2. Atrial fibrillation: Paroxysmal.  3. HTN 4. Ovarian cancer:  1985, s/p surgery. Received Adriamycin-based chemotherapy.  5. Colonc cancer: 2007, s/p surgery.  6. PVCs 7. GERD 8. PFTs (8/16) without significant abnormality. 9. Prior syncope  SH: Married, lives in [place], prior smoker.   FH: Father with MI.   ROS: All systems reviewed and negative except as per HPI.   Current Outpatient Prescriptions  Medication Sig Dispense Refill  . amiodarone (PACERONE) 200 MG tablet TAKE 1 TABLET BY MOUTH DAILY 90 tablet 3  . atorvastatin (LIPITOR) 40 MG tablet Take 1 tablet (40 mg total) by mouth daily. 90 tablet 3  . Coenzyme Q10 (COQ10) 100 MG CAPS Take 100 mg by mouth daily.     . diclofenac sodium (VOLTAREN) 1 % GEL Apply 4 g topically daily.    . furosemide (LASIX) 40 MG tablet Take 1 tablet (40 mg total) by mouth 2 (two) times daily. 90 tablet 3  . magnesium oxide (MAG-OX) 400 MG tablet Take 800 mg by mouth 2 (two) times daily.    . metoprolol succinate (TOPROL-XL) 25 MG 24 hr tablet TAKE 1 TABLET BY MOUTH EVERY DAY 90 tablet 3  . nitrofurantoin, macrocrystal-monohydrate, (MACROBID) 100 MG capsule Take 100 mg by mouth at bedtime. Will be on it for 6 months    . potassium chloride SA (K-DUR,KLOR-CON) 20 MEQ tablet Take 20-40 mEq by mouth 2 (two) times daily. Take 2 tablets (40 meq) in the morning and 1 tablet (20 meq) in the afternoon    . tretinoin (RETIN-A)  0.1 % cream Apply 1 application topically every morning.     . warfarin (COUMADIN) 2.5 MG tablet TAKE AS DIRECTED BY COUMADIN CLINIC 90 tablet 0  . zolpidem (AMBIEN) 5 MG tablet Take 5 mg by mouth at bedtime as needed for sleep.    . busPIRone (BUSPAR) 5 MG tablet Take 1 tablet (5 mg total) by mouth daily as needed (anxiety). (Patient not taking: Reported on 05/11/2015) 30 tablet 3  . conjugated estrogens (PREMARIN) vaginal cream Place 1 Applicatorful vaginally as needed (dyspareunia). Reported on 05/11/2015    . losartan (COZAAR) 25 MG tablet Take 0.5 tablets (12.5 mg total) by mouth every morning.  15 tablet 3  . spironolactone (ALDACTONE) 25 MG tablet Take 0.5 tablets (12.5 mg total) by mouth every evening. 15 tablet 3  . traMADol (ULTRAM) 50 MG tablet Take 50 mg by mouth every 6 (six) hours as needed for moderate pain. Reported on 05/11/2015     No current facility-administered medications for this encounter.   BP 100/58 mmHg  Pulse 66  Wt 131 lb 4 oz (59.535 kg)  SpO2 98% General: NAD Neck: No JVD, no thyromegaly or thyroid nodule.  Lungs: Clear to auscultation bilaterally with normal respiratory effort. CV: Nondisplaced PMI.  Heart regular S1/S2, no S3/S4, no murmur.  1+ left ankle edema (chronic since a prior fracture).  No carotid bruit.  Normal pedal pulses.  Abdomen: Soft, nontender, no hepatosplenomegaly, no distention.  Skin: Intact without lesions or rashes.  Neurologic: Alert and oriented x 3.  Psych: Normal affect. Extremities: No clubbing or cyanosis.  HEENT: Normal.   Assessment/Plan: 1. Chronic systolic CHF: EF 0000000 with diffuse hypokinesis on 12/16 echo.  Nonischemic cardiomyopathy, probably related to Adriamycin with ovarian cancer treatment.  NYHA class II-III symptoms currently.  She seems to have become more symptomatic over the last few months after being well-compensated for a long period.  Doing better on higher Lasix dose.  She looks euvolemic on exam now. - Continue Toprol XL 25 mg daily.  - Continue Lasix 40 bid with KCl 40 qam/20 qpm.  - She will start losartan 12.5 mg daily and spironolactone 12.5 mg daily (take one in the morning and one in the evening).   - Will need to think about CRT-D.  She has had persistently low EF, now more symptomatic.  She has IVCD on ECG with QRS 164 msec => not classic LBBB.  She is on the upper age range for ICD but actually is fairly healthy aside from her cardiomyopathy.  However, she has a nonischemic cardiomyopathy, so benefit in terms of ICD is probably not high.  I will get a cardiac MRI to try to risk stratify a bit  in terms of SCD risk => would be at higher risk if she has significant late gadolinium enhancement. I am going to refer her to EP after MRI.  I probably would hold off on ICD alone unless she has a lot of scarring on MRI.  Question will be suitability for CRT => QRS is very wide but not classic LBBB.  2. Atrial fibrillation: Paroxysmal.  Maintaining NSR with amiodarone.  - Continue amiodarone 200 mg daily.  Will check LFTs and TSH today.  She will need periodic eye exam as well.  - Continue warfarin.   [name] 05/12/2015

## 2015-05-11 NOTE — Progress Notes (Signed)
Advanced Heart Failure Medication Review by a Pharmacist  Does the patient  feel that his/her medications are working for him/her?  yes  Has the patient been experiencing any side effects to the medications prescribed?  no  Does the patient measure his/her own blood pressure or blood glucose at home?  yes   Does the patient have any problems obtaining medications due to transportation or finances?   no  Understanding of regimen: good Understanding of indications: good Potential of compliance: good Patient understands to avoid NSAIDs. Patient understands to avoid decongestants.  Issues to address at subsequent visits: None   Pharmacist comments:  [name] is a pleasant 80 yo F presenting without a medication list but with good recall of her regimen including most dosages. She reports excellent compliance with her regimen and did not have any specific medication-related questions or concerns at this time.   Cynthia Frank. Velva Harman, PharmD, BCPS, CPP Clinical Pharmacist Pager: [phone] Phone: [phone] 05/11/2015 12:28 PM      Time with patient: 10 minutes Preparation and documentation time: 2 minutes Total time: 12 minutes

## 2015-05-18 ENCOUNTER — Ambulatory Visit (HOSPITAL_COMMUNITY)
Admission: RE | Admit: 2015-05-18 | Discharge: 2015-05-18 | Disposition: A | Discharge status: Home / Self Care | Payer: Medicare Other | Source: Ambulatory Visit | Attending: Cardiology | Admitting: Cardiology

## 2015-05-18 DIAGNOSIS — I429 Cardiomyopathy, unspecified: Secondary | ICD-10-CM

## 2015-05-18 DIAGNOSIS — I480 Paroxysmal atrial fibrillation: Secondary | ICD-10-CM | POA: Diagnosis not present

## 2015-05-18 DIAGNOSIS — I428 Other cardiomyopathies: Secondary | ICD-10-CM | POA: Diagnosis not present

## 2015-05-18 DIAGNOSIS — Z79899 Other long term (current) drug therapy: Secondary | ICD-10-CM | POA: Insufficient documentation

## 2015-05-18 LAB — CBC
HCT: 42.2 % (ref 36.0–46.0)
Hemoglobin: 14.3 g/dL (ref 12.0–15.0)
MCH: 30.4 pg (ref 26.0–34.0)
MCHC: 33.9 g/dL (ref 30.0–36.0)
MCV: 89.6 fL (ref 78.0–100.0)
Platelets: 140 10*3/uL — ABNORMAL LOW (ref 150–400)
RBC: 4.71 MIL/uL (ref 3.87–5.11)
RDW: 13.9 % (ref 11.5–15.5)
WBC: 5.6 10*3/uL (ref 4.0–10.5)

## 2015-05-18 LAB — COMPREHENSIVE METABOLIC PANEL
ALT: 201 U/L — ABNORMAL HIGH (ref 14–54)
AST: 152 U/L — ABNORMAL HIGH (ref 15–41)
Albumin: 3.5 g/dL (ref 3.5–5.0)
Alkaline Phosphatase: 91 U/L (ref 38–126)
Anion gap: 10 (ref 5–15)
BUN: 20 mg/dL (ref 6–20)
CO2: 28 mmol/L (ref 22–32)
Calcium: 9.2 mg/dL (ref 8.9–10.3)
Chloride: 98 mmol/L — ABNORMAL LOW (ref 101–111)
Creatinine, Ser: 1.24 mg/dL — ABNORMAL HIGH (ref 0.44–1.00)
GFR calc Af Amer: 47 mL/min — ABNORMAL LOW (ref >60)
GFR calc non Af Amer: 41 mL/min — ABNORMAL LOW (ref >60)
Glucose, Bld: 108 mg/dL — ABNORMAL HIGH (ref 65–99)
Potassium: 3.8 mmol/L (ref 3.5–5.1)
Sodium: 136 mmol/L (ref 135–145)
Total Bilirubin: 1.1 mg/dL (ref 0.3–1.2)
Total Protein: 6.9 g/dL (ref 6.5–8.1)

## 2015-05-18 LAB — BRAIN NATRIURETIC PEPTIDE: B Natriuretic Peptide: 517 pg/mL — ABNORMAL HIGH (ref 0.0–100.0)

## 2015-05-18 LAB — TSH: TSH: 5.187 u[IU]/mL — ABNORMAL HIGH (ref 0.350–4.500)

## 2015-05-20 ENCOUNTER — Encounter: Payer: Self-pay | Admitting: Cardiology

## 2015-05-21 ENCOUNTER — Other Ambulatory Visit: Payer: Self-pay | Admitting: Internal Medicine

## 2015-05-21 ENCOUNTER — Ambulatory Visit (INDEPENDENT_AMBULATORY_CARE_PROVIDER_SITE_OTHER): Payer: Medicare Other | Admitting: *Deleted

## 2015-05-21 DIAGNOSIS — I4891 Unspecified atrial fibrillation: Secondary | ICD-10-CM

## 2015-05-21 LAB — POCT INR: INR: 2.6

## 2015-05-22 ENCOUNTER — Telehealth: Payer: Self-pay

## 2015-05-22 NOTE — Telephone Encounter (Signed)
PA Approved (PA - [id number]) thru 04/17/2016

## 2015-05-22 NOTE — Telephone Encounter (Signed)
PA initiated for Diclofenac via [place]

## 2015-05-27 ENCOUNTER — Ambulatory Visit (HOSPITAL_COMMUNITY): Admission: RE | Admit: 2015-05-27 | Payer: Medicare Other | Source: Ambulatory Visit

## 2015-05-29 ENCOUNTER — Other Ambulatory Visit (HOSPITAL_COMMUNITY): Payer: Medicare Other

## 2015-05-29 DIAGNOSIS — Z79899 Other long term (current) drug therapy: Secondary | ICD-10-CM | POA: Diagnosis not present

## 2015-05-29 DIAGNOSIS — J385 Laryngeal spasm: Secondary | ICD-10-CM | POA: Diagnosis not present

## 2015-06-01 ENCOUNTER — Ambulatory Visit (HOSPITAL_COMMUNITY)
Admission: RE | Admit: 2015-06-01 | Discharge: 2015-06-01 | Disposition: A | Discharge status: Home / Self Care | Payer: Medicare Other | Source: Ambulatory Visit | Attending: Internal Medicine | Admitting: Internal Medicine

## 2015-06-01 DIAGNOSIS — I340 Nonrheumatic mitral (valve) insufficiency: Secondary | ICD-10-CM | POA: Insufficient documentation

## 2015-06-01 DIAGNOSIS — Z79899 Other long term (current) drug therapy: Secondary | ICD-10-CM | POA: Insufficient documentation

## 2015-06-01 DIAGNOSIS — I429 Cardiomyopathy, unspecified: Secondary | ICD-10-CM

## 2015-06-01 DIAGNOSIS — I428 Other cardiomyopathies: Secondary | ICD-10-CM | POA: Insufficient documentation

## 2015-06-01 LAB — HEPATIC FUNCTION PANEL
ALT: 72 U/L — ABNORMAL HIGH (ref 14–54)
AST: 63 U/L — ABNORMAL HIGH (ref 15–41)
Albumin: 3.5 g/dL (ref 3.5–5.0)
Alkaline Phosphatase: 70 U/L (ref 38–126)
Bilirubin, Direct: 0.4 mg/dL (ref 0.1–0.5)
Indirect Bilirubin: 1.1 mg/dL — ABNORMAL HIGH (ref 0.3–0.9)
Total Bilirubin: 1.5 mg/dL — ABNORMAL HIGH (ref 0.3–1.2)
Total Protein: 6.9 g/dL (ref 6.5–8.1)

## 2015-06-01 LAB — T4, FREE: Free T4: 0.95 ng/dL (ref 0.61–1.12)

## 2015-06-01 LAB — TSH: TSH: 4.415 u[IU]/mL (ref 0.350–4.500)

## 2015-06-02 ENCOUNTER — Ambulatory Visit (HOSPITAL_COMMUNITY)
Admission: RE | Admit: 2015-06-02 | Discharge: 2015-06-02 | Disposition: A | Discharge status: Home / Self Care | Payer: Medicare Other | Source: Ambulatory Visit | Attending: Cardiology | Admitting: Cardiology

## 2015-06-02 DIAGNOSIS — Z79899 Other long term (current) drug therapy: Secondary | ICD-10-CM | POA: Diagnosis not present

## 2015-06-02 DIAGNOSIS — I340 Nonrheumatic mitral (valve) insufficiency: Secondary | ICD-10-CM | POA: Diagnosis not present

## 2015-06-02 DIAGNOSIS — I428 Other cardiomyopathies: Secondary | ICD-10-CM

## 2015-06-02 DIAGNOSIS — I429 Cardiomyopathy, unspecified: Secondary | ICD-10-CM | POA: Diagnosis not present

## 2015-06-02 LAB — T3, FREE: T3, Free: 1.7 pg/mL — ABNORMAL LOW (ref 2.0–4.4)

## 2015-06-02 LAB — CREATININE, SERUM
Creatinine, Ser: 1.05 mg/dL — ABNORMAL HIGH (ref 0.44–1.00)
GFR calc Af Amer: 58 mL/min — ABNORMAL LOW (ref >60)
GFR calc non Af Amer: 50 mL/min — ABNORMAL LOW (ref >60)

## 2015-06-02 MED ORDER — GADOBENATE DIMEGLUMINE 529 MG/ML IV SOLN
20.0000 mL | Freq: Once | INTRAVENOUS | Status: AC
  Administered 2015-06-02: 20 mL via INTRAVENOUS

## 2015-06-03 ENCOUNTER — Ambulatory Visit (HOSPITAL_COMMUNITY)
Admission: RE | Admit: 2015-06-03 | Discharge: 2015-06-03 | Disposition: A | Discharge status: Home / Self Care | Payer: Medicare Other | Source: Ambulatory Visit | Attending: Internal Medicine | Admitting: Internal Medicine

## 2015-06-03 ENCOUNTER — Encounter (HOSPITAL_COMMUNITY): Payer: Self-pay

## 2015-06-03 VITALS — BP 104/66 | HR 62 | Wt 128.5 lb

## 2015-06-03 DIAGNOSIS — Z7901 Long term (current) use of anticoagulants: Secondary | ICD-10-CM | POA: Insufficient documentation

## 2015-06-03 DIAGNOSIS — Z8249 Family history of ischemic heart disease and other diseases of the circulatory system: Secondary | ICD-10-CM | POA: Insufficient documentation

## 2015-06-03 DIAGNOSIS — K219 Gastro-esophageal reflux disease without esophagitis: Secondary | ICD-10-CM | POA: Diagnosis not present

## 2015-06-03 DIAGNOSIS — I5022 Chronic systolic (congestive) heart failure: Secondary | ICD-10-CM | POA: Insufficient documentation

## 2015-06-03 DIAGNOSIS — Z79899 Other long term (current) drug therapy: Secondary | ICD-10-CM | POA: Diagnosis not present

## 2015-06-03 DIAGNOSIS — I480 Paroxysmal atrial fibrillation: Secondary | ICD-10-CM | POA: Diagnosis not present

## 2015-06-03 DIAGNOSIS — I429 Cardiomyopathy, unspecified: Secondary | ICD-10-CM | POA: Diagnosis not present

## 2015-06-03 DIAGNOSIS — I110 Hypertensive heart disease with heart failure: Secondary | ICD-10-CM | POA: Diagnosis not present

## 2015-06-03 DIAGNOSIS — R7989 Other specified abnormal findings of blood chemistry: Secondary | ICD-10-CM | POA: Diagnosis not present

## 2015-06-03 DIAGNOSIS — Z8543 Personal history of malignant neoplasm of ovary: Secondary | ICD-10-CM | POA: Diagnosis not present

## 2015-06-03 DIAGNOSIS — R945 Abnormal results of liver function studies: Secondary | ICD-10-CM

## 2015-06-03 DIAGNOSIS — Z87891 Personal history of nicotine dependence: Secondary | ICD-10-CM | POA: Diagnosis not present

## 2015-06-03 DIAGNOSIS — I428 Other cardiomyopathies: Secondary | ICD-10-CM | POA: Diagnosis not present

## 2015-06-03 MED ORDER — LOSARTAN POTASSIUM 25 MG PO TABS: 25.0000 mg | ORAL_TABLET | Freq: Every morning | ORAL | Status: DC

## 2015-06-03 MED ORDER — SPIRONOLACTONE 25 MG PO TABS: 25.0000 mg | ORAL_TABLET | Freq: Every evening | ORAL | Status: DC

## 2015-06-03 MED ORDER — AMIODARONE HCL 200 MG PO TABS
100.0000 mg | ORAL_TABLET | Freq: Every day | ORAL | Status: DC
Start: 2015-06-03 — End: 2015-07-03

## 2015-06-03 NOTE — Patient Instructions (Signed)
Increase Losartan to 25 mg every AM  Increase Spironolactone to 25 mg every PM  Labs in 2 weeks  You have been referred to EP to discuss getting ICD  Abdominal Ultrasound  Your physician recommends that you schedule a follow-up appointment in: 1 month

## 2015-06-03 NOTE — Progress Notes (Signed)
Patient ID: Cynthia Frank, female   DOB: 1935/08/03, 80 y.o.   MRN: [id number] PCP: Dr. Sharlet Salina Cardiology: Dr. Radford Pax HF Cardiology: Dr. Aundra Dubin  80 yo with history of chronic systolic CHF/nonischemic cardiomyopathy, paroxysmal atrial fibrillation, and prior ovarian and colon cancers presents for evaluation of CHF. She has had a cardiomyopathy known for about 15 years now. Cardiac cath at diagnosis showed mild nonobstructive disease.  EF has been persistently low.  Cause has been thought to be Adriamycin; she received this as part of her ovarian cancer treatment. She has had paroxysmal atrial fibrillation and has remained in NSR with amiodarone use.  No recent atrial fibrillation symptoms, typically feels prolonged palpitations thought to be atrial fibrillation 2-3 times/year.  She is in NSR today.   She has had dyspnea with moderate to heavy exertion for 4-5 years now.  However, over the last few months, her symptoms have worsened.  She developed shortness of breath walking short distances on flat ground.  Lasix was increased to 40 mg bid and she has lost weight and feels better.   Currently, she is short of breath walking up steps but does ok on flat ground.  No problems with dressing and housework.  She has no orthopnea/PND.  No cough or chest pain.  Weight is down 3 lbs.   Cardiac MRI was done in 1/17, showing EF 29% with normal RV.  Septal-lateral dyssynchrony was present and there was a non-coronary pattern of LGE in the septum.  Were noted to be high after last appointment.  Amiodarone was decreased to 100 mg daily.  Repeat LFTs were lower.   ECG: NSR, IVCD (164 msec), 1st degree AV block  Labs (1/17): K 4.2, creatinine 1.14, BNP 855  PMH: 1. Chronic systolic CHF: Nonischemic cardiomyopathy, thought to be related to adriamycin that she had for treatment of ovarian cancer.  NICM diagnosed ~ 2001.  - LHC ~ 2001 with 30% stenosis D1.   - Echo (2013) EF 25-30%.  - Echo (10/14) with EF  25-30%. - Echo (12/16) with EF 20-25%, mild LV dilation, restrictive diastolic function, moderate MR, severe LAE. - Cardiac MRI (1/17): Moderately dilated LV with EF 29%. Diffuse hypokinesis looking worse in the septal wall. Septal-lateral dyssynchrony. Normal RV size and systolic function.  Moderate MR with tethered posterior leaflet.  Mid-wall LGE in the basal to mid septum. This is not a coronary disease pattern. Could be suggestive of prior myocarditis. 2. Atrial fibrillation: Paroxysmal.  3. HTN 4. Ovarian cancer: 1985, s/p surgery. Received Adriamycin-based chemotherapy.  5. Colonc cancer: 2007, s/p surgery.  6. PVCs 7. GERD 8. PFTs (8/16) without significant abnormality. 9. Prior syncope  SH: Married, lives in [place], prior smoker.   FH: Father with MI.   ROS: All systems reviewed and negative except as per HPI.   Current Outpatient Prescriptions  Medication Sig Dispense Refill  . amiodarone (PACERONE) 200 MG tablet Take 0.5 tablets (100 mg total) by mouth daily. 90 tablet 3  . atorvastatin (LIPITOR) 40 MG tablet Take 1 tablet (40 mg total) by mouth daily. 90 tablet 3  . Coenzyme Q10 (COQ10) 100 MG CAPS Take 100 mg by mouth daily.     Marland Kitchen conjugated estrogens (PREMARIN) vaginal cream Place 1 Applicatorful vaginally as needed (dyspareunia). Reported on 05/11/2015    . diclofenac sodium (VOLTAREN) 1 % GEL APPLY 2 GRAMS TOPICALLY 4TIMES DAILY 100 g 3  . furosemide (LASIX) 40 MG tablet Take 1 tablet (40 mg total) by mouth 2 (two)  times daily. 90 tablet 3  . losartan (COZAAR) 25 MG tablet Take 1 tablet (25 mg total) by mouth every morning. 30 tablet 3  . magnesium oxide (MAG-OX) 400 MG tablet Take 800 mg by mouth 2 (two) times daily.    . metoprolol succinate (TOPROL-XL) 25 MG 24 hr tablet TAKE 1 TABLET BY MOUTH EVERY DAY 90 tablet 3  . nitrofurantoin, macrocrystal-monohydrate, (MACROBID) 100 MG capsule Take 100 mg by mouth at bedtime. Will be on it for 6 months    . potassium  chloride SA (K-DUR,KLOR-CON) 20 MEQ tablet Take 20-40 mEq by mouth 2 (two) times daily. Take 2 tablets (40 meq) in the morning and 1 tablet (20 meq) in the afternoon    . spironolactone (ALDACTONE) 25 MG tablet Take 1 tablet (25 mg total) by mouth every evening. 30 tablet 3  . traMADol (ULTRAM) 50 MG tablet Take 50 mg by mouth every 6 (six) hours as needed for moderate pain. Reported on 05/11/2015    . tretinoin (RETIN-A) 0.1 % cream Apply 1 application topically every morning.     . warfarin (COUMADIN) 2.5 MG tablet TAKE AS DIRECTED BY COUMADIN CLINIC 90 tablet 0  . zolpidem (AMBIEN) 5 MG tablet Take 5 mg by mouth at bedtime as needed for sleep.    . busPIRone (BUSPAR) 5 MG tablet Take 1 tablet (5 mg total) by mouth daily as needed (anxiety). (Patient not taking: Reported on 05/11/2015) 30 tablet 3   No current facility-administered medications for this encounter.   BP 104/66 mmHg  Pulse 62  Wt 128 lb 8 oz (58.287 kg)  SpO2 100% General: NAD Neck: No JVD, no thyromegaly or thyroid nodule.  Lungs: Clear to auscultation bilaterally with normal respiratory effort. CV: Nondisplaced PMI.  Heart regular S1/S2, no S3/S4, no murmur.  1+ left ankle edema (chronic since a prior fracture), no edema on right.  No carotid bruit.  Normal pedal pulses.  Abdomen: Soft, nontender, no hepatosplenomegaly, no distention.  Skin: Intact without lesions or rashes.  Neurologic: Alert and oriented x 3.  Psych: Normal affect. Extremities: No clubbing or cyanosis.  HEENT: Normal.   Assessment/Plan: 1. Chronic systolic CHF: EF 0000000 with diffuse hypokinesis on 12/16 echo.  Nonischemic cardiomyopathy, probably related to Adriamycin with ovarian cancer treatment.  Cardiac MRI in 1/17 showed EF 29% with normal RV and septal-lateral dyssynchrony.  There was a non-cardiac pattern of LGE in the septum, cannot rule out prior myocarditis based on this pattern.  NYHA class II symptoms currently.  She looks euvolemic on exam.   Overall doing better compared to a month or two ago.  - Continue Toprol XL 25 mg daily.  - Continue Lasix 40 bid.  - Increase losartan to 25 daily and spironolactone to 25 daily. BMET/BNP in 2 wks.    - Will need to think about CRT-D.  She has had persistently low EF, now more symptomatic.  She has IVCD on ECG with QRS 164 msec => not classic LBBB but she does have septal-lateral dyssynchrony noted on cardiac MRI.  She is on the upper age range for ICD but actually is fairly healthy aside from her cardiomyopathy.  However, she has a nonischemic cardiomyopathy, so benefit in terms of ICD is not as high. She does, however, have some scarring (LGE) noted in the IV septum.  I would favor CRT +/- defibrillator, needs EP appointment => will refer.  - Check SPEP/UPEP for completeness.   2. Atrial fibrillation: Paroxysmal.  Maintaining NSR  with amiodarone. Amiodarone decreased to 100 mg daily with elevated LFTs.  - Continue amiodarone at 100 mg daily. Check LFTs again today. She will need periodic eye exam as well.  - Continue warfarin. Check CBC.  3. Elevated LFTs: This may be due to CHF.  LFTs improved with followup labs, will recheck today. Also would consider amiodarone as a cause, recently decreased dose to 100 mg daily.  She will get an abdominal US.   [name] 06/03/2015

## 2015-06-05 ENCOUNTER — Ambulatory Visit (INDEPENDENT_AMBULATORY_CARE_PROVIDER_SITE_OTHER): Payer: Medicare Other | Admitting: Internal Medicine

## 2015-06-05 ENCOUNTER — Encounter: Payer: Self-pay | Admitting: Internal Medicine

## 2015-06-05 ENCOUNTER — Ambulatory Visit (INDEPENDENT_AMBULATORY_CARE_PROVIDER_SITE_OTHER): Payer: Medicare Other | Admitting: *Deleted

## 2015-06-05 VITALS — BP 98/66 | HR 64 | Ht 64.0 in | Wt 128.0 lb

## 2015-06-05 DIAGNOSIS — I5022 Chronic systolic (congestive) heart failure: Secondary | ICD-10-CM

## 2015-06-05 DIAGNOSIS — I4891 Unspecified atrial fibrillation: Secondary | ICD-10-CM

## 2015-06-05 LAB — POCT INR: INR: 2.3

## 2015-06-05 NOTE — Progress Notes (Signed)
HPI Cynthia Frank is referred today by Dr. Aundra Dubin for consideration for ICD implantation. She is a pleasant 80 yo woman with a h/o ovarian CA, s/p surgery and chemotherapy including adriamycin (1985), atrial fib well controlled with amiodarone, IVCD/LBBB with a QRS of 164, and dysynchrony on MRI and class 2 CHF. She has a h/o syncope, the last episode occuring approx 4 years ago. She remains active. Allergies  Allergen Reactions  . Clindamycin/Lincomycin     itching  . Pneumococcal Vaccines Itching and Swelling    Redness   . Sulfonamide Derivatives Itching and Swelling     Current Outpatient Prescriptions  Medication Sig Dispense Refill  . amiodarone (PACERONE) 200 MG tablet Take 0.5 tablets (100 mg total) by mouth daily. 90 tablet 3  . atorvastatin (LIPITOR) 40 MG tablet Take 1 tablet (40 mg total) by mouth daily. 90 tablet 3  . busPIRone (BUSPAR) 5 MG tablet Take 1 tablet (5 mg total) by mouth daily as needed (anxiety). 30 tablet 3  . Coenzyme Q10 (COQ10) 100 MG CAPS Take 100 mg by mouth daily.     Marland Kitchen conjugated estrogens (PREMARIN) vaginal cream Place 1 Applicatorful vaginally as needed (dyspareunia). Reported on 05/11/2015    . diclofenac sodium (VOLTAREN) 1 % GEL APPLY 2 GRAMS TOPICALLY 4TIMES DAILY 100 g 3  . furosemide (LASIX) 40 MG tablet Take 1 tablet (40 mg total) by mouth 2 (two) times daily. 90 tablet 3  . ipratropium (ATROVENT) 0.03 % nasal spray Place 2 sprays into both nostrils 2 (two) times daily.  11  . losartan (COZAAR) 25 MG tablet Take 1 tablet (25 mg total) by mouth every morning. 30 tablet 3  . magnesium oxide (MAG-OX) 400 MG tablet Take 800 mg by mouth 2 (two) times daily.    . metoprolol succinate (TOPROL-XL) 25 MG 24 hr tablet TAKE 1 TABLET BY MOUTH EVERY DAY 90 tablet 3  . nitrofurantoin, macrocrystal-monohydrate, (MACROBID) 100 MG capsule Take 100 mg by mouth at bedtime. Will be on it for 6 months    . potassium chloride SA (K-DUR,KLOR-CON) 20 MEQ tablet  Take 20-40 mEq by mouth 2 (two) times daily. Take 2 tablets (40 meq) in the morning and 1 tablet (20 meq) in the afternoon    . spironolactone (ALDACTONE) 25 MG tablet Take 1 tablet (25 mg total) by mouth every evening. 30 tablet 3  . traMADol (ULTRAM) 50 MG tablet Take 50 mg by mouth every 6 (six) hours as needed for moderate pain. Reported on 05/11/2015    . tretinoin (RETIN-A) 0.1 % cream Apply 1 application topically every morning.     . warfarin (COUMADIN) 2.5 MG tablet TAKE AS DIRECTED BY COUMADIN CLINIC 90 tablet 0  . zolpidem (AMBIEN) 5 MG tablet Take 5 mg by mouth at bedtime as needed for sleep.     No current facility-administered medications for this visit.     Past Medical History  Diagnosis Date  . Hx: UTI (urinary tract infection)   . Nonischemic cardiomyopathy ([place])     last EF assessment 40% by MUGA  . History of ovarian cancer 1985  . Partial bowel obstruction ([place])   . Internal hemorrhoid   . Colon cancer ([place]) 03/2006    T3, N0  . Colon polyps 12/07/2010  . Hypertension   . Atrial fibrillation ([place])   . PVC (premature ventricular contraction)   . Coronary artery disease     nonobstructive with 30% D1  . Hyperlipidemia  ROS:   All systems reviewed and negative except as noted in the HPI.   Past Surgical History  Procedure Laterality Date  . Abdominal hysterectomy    . Oophorectomy      '61  . Tonsillectomy      '44  . Resection of right ovarian cancer '84, repeat '86    . Laparotomy      '80 adhesions  . Resection due to partial bowel obstruction      1985  . Colon surgery      for colon cancer in 2007  . Laparotomy for takedown of intestinal obstruction secondary to adhesions      1989  . Ankle closed reduction  10/16/2011    Procedure: CLOSED REDUCTION ANKLE;  Surgeon: Tobi Bastos, MD;  Location: [facility];  Service: Orthopedics;  Laterality: Left;  . Colonoscopy N/A 03/11/2013    Procedure: COLONOSCOPY;  Surgeon: Ladene Artist, MD;   Location: [facility];  Service: Endoscopy;  Laterality: N/A;  . Esophagogastroduodenoscopy (egd) with propofol N/A 04/21/2015    Procedure: ESOPHAGOGASTRODUODENOSCOPY (EGD) WITH PROPOFOL;  Surgeon: Ladene Artist, MD;  Location: [facility];  Service: Endoscopy;  Laterality: N/A;  . Colonoscopy with propofol N/A 04/21/2015    Procedure: COLONOSCOPY WITH PROPOFOL;  Surgeon: Ladene Artist, MD;  Location: [facility];  Service: Endoscopy;  Laterality: N/A;     Family History  Problem Relation Age of Onset  . Uterine cancer Mother   . Prostate cancer Father   . Heart disease Father   . Heart attack Father   . Colon cancer Neg Hx      Social History   Social History  . Marital Status: Married    Spouse Name: N/A  . Number of Children: 2  . Years of Education: N/A   Occupational History  . retired    Social History Main Topics  . Smoking status: Former Smoker -- 0.50 packs/day for 20 years    Types: Cigarettes    Quit date: 04/18/1978  . Smokeless tobacco: Not on file  . Alcohol Use: 8.4 oz/week    14 Glasses of wine per week     Comment: has wine before dinner   . Drug Use: No  . Sexual Activity: Not on file   Other Topics Concern  . Not on file   Social History Narrative   Patient had never smoked.   Alcohol use- yes   Daily caffeine use- coffee   Illicit drug use- no   Occupation:retired      BP 98/66 mmHg  Pulse 64  Ht 5\' 4"  (1.626 m)  Wt 128 lb (58.06 kg)  BMI 21.96 kg/m2  Physical Exam:  Well appearing 80 yo woman, NAD HEENT: Unremarkable Neck:  6 cm JVD, no thyromegally Lymphatics:  No adenopathy Back:  No CVA tenderness Lungs:  Clear with no wheezes HEART:  Regular rate rhythm, no murmurs, no rubs, no clicks Abd:  soft, positive bowel sounds, no organomegally, no rebound, no guarding Ext:  2 plus pulses, no edema, no cyanosis, no clubbing Skin:  No rashes no nodules Neuro:  CN II through XII intact, motor grossly intact  EKG - NSR with  IVCD  Assess/Plan: 1. Atrial fibrillation - she is maintaining NSR on amiodarone 2. Chronic systolic heart failure - her symptoms are class 2. We discussed ICD and BiV PPM insertion in detail. I have reviewed the risks/benefit/goals/expectations of device implant including both BiV PPM or ICD and she wishes to proceed.  3. HTN - her blood pressure is well controlled. Will follow 4. PVC's - she is well controlled. She will continue her amiodarone.  Mikle Bosworth.D.

## 2015-06-05 NOTE — Patient Instructions (Addendum)
Medication Instructions:  Your physician recommends that you continue on your current medications as directed. Please refer to the Current Medication list given to you today.  Labwork: None ordered  Testing/Procedures: Your physician has recommended that you have a defibrillator inserted. An implantable cardioverter defibrillator (ICD) is a small device that is placed in your chest or, in rare cases, your abdomen. This device uses electrical pulses or shocks to help control life-threatening, irregular heartbeats that could lead the heart to suddenly stop beating (sudden cardiac arrest). Leads are attached to the ICD that goes into your heart. This is done in the hospital and usually requires an overnight stay.   Please call the office if/when you are ready to schedule this procedure.  The following are available dates (these are subject to change): 3/3, 3/15, 3/21, 3/28  Follow-Up: To be determined once you have made a decision on procedure.  Any Other Special Instructions Will Be Listed Below (If Applicable).  If you need a refill on your cardiac medications before your next appointment, please call your pharmacy.  Thank you for choosing [facility]!!

## 2015-06-12 ENCOUNTER — Other Ambulatory Visit (HOSPITAL_COMMUNITY): Payer: Medicare Other

## 2015-06-22 ENCOUNTER — Ambulatory Visit (HOSPITAL_COMMUNITY)
Admission: RE | Admit: 2015-06-22 | Discharge: 2015-06-22 | Disposition: A | Discharge status: Home / Self Care | Payer: Medicare Other | Source: Ambulatory Visit | Attending: Cardiology | Admitting: Cardiology

## 2015-06-22 ENCOUNTER — Other Ambulatory Visit (HOSPITAL_COMMUNITY): Payer: Self-pay

## 2015-06-22 DIAGNOSIS — I5022 Chronic systolic (congestive) heart failure: Secondary | ICD-10-CM | POA: Diagnosis not present

## 2015-06-22 DIAGNOSIS — N289 Disorder of kidney and ureter, unspecified: Secondary | ICD-10-CM | POA: Insufficient documentation

## 2015-06-22 DIAGNOSIS — R7989 Other specified abnormal findings of blood chemistry: Secondary | ICD-10-CM | POA: Insufficient documentation

## 2015-06-22 DIAGNOSIS — R945 Abnormal results of liver function studies: Secondary | ICD-10-CM

## 2015-06-22 LAB — COMPREHENSIVE METABOLIC PANEL
ALT: 74 U/L — ABNORMAL HIGH (ref 14–54)
AST: 62 U/L — ABNORMAL HIGH (ref 15–41)
Albumin: 4 g/dL (ref 3.5–5.0)
Alkaline Phosphatase: 73 U/L (ref 38–126)
Anion gap: 11 (ref 5–15)
BUN: 20 mg/dL (ref 6–20)
CO2: 29 mmol/L (ref 22–32)
Calcium: 9.5 mg/dL (ref 8.9–10.3)
Chloride: 97 mmol/L — ABNORMAL LOW (ref 101–111)
Creatinine, Ser: 1.27 mg/dL — ABNORMAL HIGH (ref 0.44–1.00)
GFR calc Af Amer: 46 mL/min — ABNORMAL LOW (ref >60)
GFR calc non Af Amer: 40 mL/min — ABNORMAL LOW (ref >60)
Glucose, Bld: 100 mg/dL — ABNORMAL HIGH (ref 65–99)
Potassium: 4.5 mmol/L (ref 3.5–5.1)
Sodium: 137 mmol/L (ref 135–145)
Total Bilirubin: 0.9 mg/dL (ref 0.3–1.2)
Total Protein: 7.2 g/dL (ref 6.5–8.1)

## 2015-06-22 LAB — CBC
HCT: 40.5 % (ref 36.0–46.0)
Hemoglobin: 13.5 g/dL (ref 12.0–15.0)
MCH: 29.7 pg (ref 26.0–34.0)
MCHC: 33.3 g/dL (ref 30.0–36.0)
MCV: 89.2 fL (ref 78.0–100.0)
Platelets: 144 10*3/uL — ABNORMAL LOW (ref 150–400)
RBC: 4.54 MIL/uL (ref 3.87–5.11)
RDW: 15 % (ref 11.5–15.5)
WBC: 6.9 10*3/uL (ref 4.0–10.5)

## 2015-06-22 LAB — BRAIN NATRIURETIC PEPTIDE: B Natriuretic Peptide: 341.2 pg/mL — ABNORMAL HIGH (ref 0.0–100.0)

## 2015-06-22 NOTE — Addendum Note (Signed)
Encounter addended by: Kerry Dory, CMA on: 06/22/2015 11:01 AM<BR>     Documentation filed: Orders, Dx Association

## 2015-06-22 NOTE — Addendum Note (Signed)
Encounter addended by: Effie Berkshire, RN on: 06/22/2015 10:12 AM<BR>     Documentation filed: Dx Association, Orders

## 2015-06-23 LAB — PROTEIN ELECTROPHORESIS, SERUM
A/G Ratio: 1.1 (ref 0.7–1.7)
Albumin ELP: 3.7 g/dL (ref 2.9–4.4)
Alpha-1-Globulin: 0.2 g/dL (ref 0.0–0.4)
Alpha-2-Globulin: 0.8 g/dL (ref 0.4–1.0)
Beta Globulin: 1.1 g/dL (ref 0.7–1.3)
Gamma Globulin: 1.3 g/dL (ref 0.4–1.8)
Globulin, Total: 3.4 g/dL (ref 2.2–3.9)
Total Protein ELP: 7.1 g/dL (ref 6.0–8.5)

## 2015-06-26 ENCOUNTER — Ambulatory Visit (INDEPENDENT_AMBULATORY_CARE_PROVIDER_SITE_OTHER): Payer: Medicare Other | Admitting: *Deleted

## 2015-06-26 DIAGNOSIS — I4891 Unspecified atrial fibrillation: Secondary | ICD-10-CM

## 2015-06-26 LAB — POCT INR: INR: 1.9

## 2015-06-30 ENCOUNTER — Telehealth (HOSPITAL_COMMUNITY): Payer: Self-pay

## 2015-06-30 NOTE — Telephone Encounter (Signed)
Abd [place] results reviewed with patient. No acute findings. Patient has no questions or concerns at this time.  Renee Pain

## 2015-07-03 ENCOUNTER — Encounter (HOSPITAL_COMMUNITY): Payer: Self-pay

## 2015-07-03 ENCOUNTER — Ambulatory Visit (HOSPITAL_COMMUNITY)
Admission: RE | Admit: 2015-07-03 | Discharge: 2015-07-03 | Disposition: A | Discharge status: Home / Self Care | Payer: Medicare Other | Source: Ambulatory Visit | Attending: Cardiology | Admitting: Cardiology

## 2015-07-03 VITALS — BP 110/60 | HR 71 | Wt 128.8 lb

## 2015-07-03 DIAGNOSIS — Z85038 Personal history of other malignant neoplasm of large intestine: Secondary | ICD-10-CM | POA: Insufficient documentation

## 2015-07-03 DIAGNOSIS — I480 Paroxysmal atrial fibrillation: Secondary | ICD-10-CM | POA: Insufficient documentation

## 2015-07-03 DIAGNOSIS — Z8249 Family history of ischemic heart disease and other diseases of the circulatory system: Secondary | ICD-10-CM | POA: Insufficient documentation

## 2015-07-03 DIAGNOSIS — Z87891 Personal history of nicotine dependence: Secondary | ICD-10-CM | POA: Diagnosis not present

## 2015-07-03 DIAGNOSIS — Z79899 Other long term (current) drug therapy: Secondary | ICD-10-CM | POA: Diagnosis not present

## 2015-07-03 DIAGNOSIS — Z8543 Personal history of malignant neoplasm of ovary: Secondary | ICD-10-CM | POA: Diagnosis not present

## 2015-07-03 DIAGNOSIS — Z7901 Long term (current) use of anticoagulants: Secondary | ICD-10-CM | POA: Insufficient documentation

## 2015-07-03 DIAGNOSIS — I428 Other cardiomyopathies: Secondary | ICD-10-CM | POA: Insufficient documentation

## 2015-07-03 DIAGNOSIS — K219 Gastro-esophageal reflux disease without esophagitis: Secondary | ICD-10-CM | POA: Insufficient documentation

## 2015-07-03 DIAGNOSIS — I5022 Chronic systolic (congestive) heart failure: Secondary | ICD-10-CM | POA: Insufficient documentation

## 2015-07-03 DIAGNOSIS — I110 Hypertensive heart disease with heart failure: Secondary | ICD-10-CM | POA: Insufficient documentation

## 2015-07-03 DIAGNOSIS — R7989 Other specified abnormal findings of blood chemistry: Secondary | ICD-10-CM | POA: Insufficient documentation

## 2015-07-03 MED ORDER — LOSARTAN POTASSIUM 25 MG PO TABS: 25.0000 mg | ORAL_TABLET | Freq: Two times a day (BID) | ORAL | Status: DC

## 2015-07-03 MED ORDER — SPIRONOLACTONE 25 MG PO TABS: 25.0000 mg | ORAL_TABLET | Freq: Every evening | ORAL | Status: DC

## 2015-07-03 MED ORDER — AMIODARONE HCL 200 MG PO TABS: 100.0000 mg | ORAL_TABLET | Freq: Every day | ORAL | Status: DC

## 2015-07-03 NOTE — Patient Instructions (Signed)
INCREASE Losartan to 25 mg TWICE DAILY.  Return in 1-2 weeks for labs.  Will arrange for Dr. Cristopher Peru and staff to schedule you for CRT procedure. Address: [address] ([place]), [place], [place] [address]  Phone: [phone]  Follow up 2 months with Dr. Aundra Dubin.  Do the following things EVERYDAY: 1) Weigh yourself in the morning before breakfast. Write it down and keep it in a log. 2) Take your medicines as prescribed 3) Eat low salt foods-Limit salt (sodium) to 2000 mg per day.  4) Stay as active as you can everyday 5) Limit all fluids for the day to less than 2 liters

## 2015-07-04 NOTE — Progress Notes (Signed)
Patient ID: Cynthia Frank, female   DOB: [birth year], 80 y.o.   MRN: [id number] PCP: Dr. Sharlet Salina Cardiology: Dr. Radford Pax HF Cardiology: Dr. Aundra Dubin  80 yo with history of chronic systolic CHF/nonischemic cardiomyopathy, paroxysmal atrial fibrillation, and prior ovarian and colon cancers presents for evaluation of CHF. She has had a cardiomyopathy known for about 15 years now. Cardiac cath at diagnosis showed mild nonobstructive disease.  EF has been persistently low.  Cause has been thought to be Adriamycin; she received this as part of her ovarian cancer treatment. She has had paroxysmal atrial fibrillation and has remained in NSR with amiodarone use.  No recent atrial fibrillation symptoms, typically feels prolonged palpitations thought to be atrial fibrillation 2-3 times/year.  She is in NSR today.   She has had dyspnea with moderate to heavy exertion for 4-5 years now.  However, prior to her initial appointment with me, her symptoms had worsened.  She developed shortness of breath walking short distances on flat ground.  Lasix was increased to 40 mg bid and she lost weight and felt better.     Currently, she is doing well.  She is mildly short of breath walking up steps but does ok on flat ground.  No problems with dressing and housework.  She has no orthopnea/PND.  No cough or chest pain.  Weight is stable. Occasional lightheadedness with standing.  Cardiac MRI was done in 1/17, showing EF 29% with normal RV.  Septal-lateral dyssynchrony was present and there was a non-coronary pattern of LGE in the septum.  LFT were noted to be high after recently.  Amiodarone was decreased to 100 mg daily.   ECG: NSR, IVCD (164 msec), 1st degree AV block  Labs (1/17): K 4.2, creatinine 1.14, BNP 855 Labs (3/17): SPEP negative, BNP 341, K 4.5, creatinine 1.27, AST 62, ALT 74, TSH normal with low free T3 and normal free T4, HCT 46.5  PMH: 1. Chronic systolic CHF: Nonischemic cardiomyopathy, thought to be  related to adriamycin that she had for treatment of ovarian cancer.  NICM diagnosed ~ 2001.  - LHC ~ 2001 with 30% stenosis D1.   - Echo (2013) EF 25-30%.  - Echo (10/14) with EF 25-30%. - Echo (12/16) with EF 20-25%, mild LV dilation, restrictive diastolic function, moderate MR, severe LAE. - Cardiac MRI (1/17): Moderately dilated LV with EF 29%. Diffuse hypokinesis looking worse in the septal wall. Septal-lateral dyssynchrony. Normal RV size and systolic function.  Moderate MR with tethered posterior leaflet.  Mid-wall LGE in the basal to mid septum. This is not a coronary disease pattern. Could be suggestive of prior myocarditis. 2. Atrial fibrillation: Paroxysmal.  3. HTN 4. Ovarian cancer: 1985, s/p surgery. Received Adriamycin-based chemotherapy.  5. Colonc cancer: 2007, s/p surgery.  6. PVCs 7. GERD 8. PFTs (8/16) without significant abnormality. 9. Prior syncope  SH: Married, lives in [place], prior smoker.   FH: Father with MI.   ROS: All systems reviewed and negative except as per HPI.   Current Outpatient Prescriptions  Medication Sig Dispense Refill  . amiodarone (PACERONE) 200 MG tablet Take 0.5 tablets (100 mg total) by mouth daily. 90 tablet 3  . atorvastatin (LIPITOR) 40 MG tablet Take 1 tablet (40 mg total) by mouth daily. 90 tablet 3  . busPIRone (BUSPAR) 5 MG tablet Take 1 tablet (5 mg total) by mouth daily as needed (anxiety). 30 tablet 3  . Coenzyme Q10 (COQ10) 100 MG CAPS Take 100 mg by mouth daily.     Marland Kitchen  conjugated estrogens (PREMARIN) vaginal cream Place 1 Applicatorful vaginally as needed (dyspareunia). Reported on 05/11/2015    . diclofenac sodium (VOLTAREN) 1 % GEL APPLY 2 GRAMS TOPICALLY 4TIMES DAILY 100 g 3  . furosemide (LASIX) 40 MG tablet Take 1 tablet (40 mg total) by mouth 2 (two) times daily. 90 tablet 3  . ipratropium (ATROVENT) 0.03 % nasal spray Place 2 sprays into both nostrils 2 (two) times daily.  11  . losartan (COZAAR) 25 MG tablet Take 1  tablet (25 mg total) by mouth 2 (two) times daily. 60 tablet 3  . magnesium oxide (MAG-OX) 400 MG tablet Take 800 mg by mouth 2 (two) times daily.    . metoprolol succinate (TOPROL-XL) 25 MG 24 hr tablet TAKE 1 TABLET BY MOUTH EVERY DAY 90 tablet 3  . nitrofurantoin, macrocrystal-monohydrate, (MACROBID) 100 MG capsule Take 100 mg by mouth at bedtime. Will be on it for 6 months    . potassium chloride SA (K-DUR,KLOR-CON) 20 MEQ tablet Take 20-40 mEq by mouth 2 (two) times daily. Take 2 tablets (40 meq) in the morning and 1 tablet (20 meq) in the afternoon    . spironolactone (ALDACTONE) 25 MG tablet Take 1 tablet (25 mg total) by mouth every evening. 90 tablet 3  . tretinoin (RETIN-A) 0.1 % cream Apply 1 application topically every morning.     . warfarin (COUMADIN) 2.5 MG tablet TAKE AS DIRECTED BY COUMADIN CLINIC 90 tablet 0  . zolpidem (AMBIEN) 5 MG tablet Take 5 mg by mouth at bedtime as needed for sleep.    . traMADol (ULTRAM) 50 MG tablet Take 50 mg by mouth every 6 (six) hours as needed for moderate pain. Reported on 07/03/2015     No current facility-administered medications for this encounter.   BP 110/60 mmHg  Pulse 71  Wt 128 lb 12 oz (58.401 kg)  SpO2 100% General: NAD Neck: No JVD, no thyromegaly or thyroid nodule.  Lungs: Clear to auscultation bilaterally with normal respiratory effort. CV: Nondisplaced PMI.  Heart regular S1/S2, no S3/S4, no murmur.  No edema.  No carotid bruit.  Normal pedal pulses.  Abdomen: Soft, nontender, no hepatosplenomegaly, no distention.  Skin: Intact without lesions or rashes.  Neurologic: Alert and oriented x 3.  Psych: Normal affect. Extremities: No clubbing or cyanosis.  HEENT: Normal.   Assessment/Plan: 1. Chronic systolic CHF: EF 0000000 with diffuse hypokinesis on 12/16 echo.  Nonischemic cardiomyopathy, probably related to Adriamycin with ovarian cancer treatment.  Cardiac MRI in 1/17 showed EF 29% with normal RV and septal-lateral  dyssynchrony.  There was a non-cardiac pattern of LGE in the septum, cannot rule out prior myocarditis based on this pattern.  NYHA class II symptoms currently.  She looks euvolemic on exam.    - Continue Toprol XL 25 mg daily.  - Continue Lasix 40 bid and spironolactone 25 daily.  - Increase losartan to 25 bid, BMET/BNP in 2 wks.    - She has had persistently low EF.  She has IVCD on ECG with QRS 164 msec => not classic LBBB but she does have septal-lateral dyssynchrony noted on cardiac MRI.  She is on the upper age range for ICD but actually is fairly healthy aside from her cardiomyopathy.  However, she has a nonischemic cardiomyopathy, so benefit in terms of ICD is not as high. She does, however, have some scarring (LGE) noted in the IV septum.  I would favor CRT-D.  She has seen Dr Lovena Le and has decided  she wants a device.  I will contact his nurse to arrange. 2. Atrial fibrillation: Paroxysmal.  Maintaining NSR with amiodarone. Amiodarone decreased to 100 mg daily with elevated LFTs.  - Continue amiodarone at 100 mg daily. Check LFTs again today. She will need periodic eye exam as well.  Free T3 was low when recently checked with normal TSH and free T4. Will follow closely.  - Continue warfarin.  3. Elevated LFTs: This may be due to CHF versus amiodarone.  Amiodarone decreased to 100 mg daily.  Abdominal US did not show liver abnormalities or gallstones. Will check LFTs with labs in 2 wks.    [name] 07/04/2015

## 2015-07-07 ENCOUNTER — Telehealth: Payer: Self-pay | Admitting: Internal Medicine

## 2015-07-07 DIAGNOSIS — I428 Other cardiomyopathies: Secondary | ICD-10-CM

## 2015-07-07 NOTE — Telephone Encounter (Signed)
Spoke with pt. She would like to proceed with device placement.  I told her I would have Claiborne Billings contact her to schedule this. If pt can't be reached she asked we call her on her cell phone.  [phone].  Pt reports she will be out of town the last 2 weeks in April

## 2015-07-07 NOTE — Telephone Encounter (Signed)
NEW MESSAGE   Pt is calling to schedule her surgery for a pacer  With Dr.Klein

## 2015-07-08 NOTE — Telephone Encounter (Signed)
New message  Pt called request a call back to discuss the device placement

## 2015-07-08 NOTE — Telephone Encounter (Signed)
lmom for patient to return my call tomorrow

## 2015-07-10 ENCOUNTER — Other Ambulatory Visit (INDEPENDENT_AMBULATORY_CARE_PROVIDER_SITE_OTHER): Payer: Medicare Other

## 2015-07-10 ENCOUNTER — Encounter: Payer: Self-pay | Admitting: *Deleted

## 2015-07-10 DIAGNOSIS — I428 Other cardiomyopathies: Secondary | ICD-10-CM

## 2015-07-10 DIAGNOSIS — I429 Cardiomyopathy, unspecified: Secondary | ICD-10-CM

## 2015-07-10 LAB — CBC WITH DIFFERENTIAL/PLATELET
Basophils Absolute: 0.1 10*3/uL (ref 0.0–0.1)
Basophils Relative: 1 % (ref 0–1)
Eosinophils Absolute: 0.1 10*3/uL (ref 0.0–0.7)
Eosinophils Relative: 1 % (ref 0–5)
HCT: 40 % (ref 36.0–46.0)
Hemoglobin: 13.5 g/dL (ref 12.0–15.0)
Lymphocytes Relative: 18 % (ref 12–46)
Lymphs Abs: 1.2 10*3/uL (ref 0.7–4.0)
MCH: 30.3 pg (ref 26.0–34.0)
MCHC: 33.8 g/dL (ref 30.0–36.0)
MCV: 89.7 fL (ref 78.0–100.0)
MPV: 10.2 fL (ref 8.6–12.4)
Monocytes Absolute: 0.6 10*3/uL (ref 0.1–1.0)
Monocytes Relative: 10 % (ref 3–12)
Neutro Abs: 4.5 10*3/uL (ref 1.7–7.7)
Neutrophils Relative %: 70 % (ref 43–77)
Platelets: 150 10*3/uL (ref 150–400)
RBC: 4.46 MIL/uL (ref 3.87–5.11)
RDW: 14.9 % (ref 11.5–15.5)
WBC: 6.4 10*3/uL (ref 4.0–10.5)

## 2015-07-10 LAB — BASIC METABOLIC PANEL
BUN: 27 mg/dL — ABNORMAL HIGH (ref 7–25)
CO2: 26 mmol/L (ref 20–31)
Calcium: 8.8 mg/dL (ref 8.6–10.4)
Chloride: 97 mmol/L — ABNORMAL LOW (ref 98–110)
Creat: 1.18 mg/dL — ABNORMAL HIGH (ref 0.60–0.93)
Glucose, Bld: 98 mg/dL (ref 65–99)
Potassium: 4 mmol/L (ref 3.5–5.3)
Sodium: 135 mmol/L (ref 135–146)

## 2015-07-10 NOTE — Telephone Encounter (Signed)
Procedure schedule for 07/14/15.  Pre cert sent  Pt aware

## 2015-07-11 LAB — PROTIME-INR
INR: 1.68 — ABNORMAL HIGH (ref <1.50)
Prothrombin Time: 20 seconds — ABNORMAL HIGH (ref 11.6–15.2)

## 2015-07-14 ENCOUNTER — Ambulatory Visit (HOSPITAL_COMMUNITY)
Admission: RE | Admit: 2015-07-14 | Discharge: 2015-07-15 | Disposition: A | Discharge status: Home / Self Care | Payer: Medicare Other | Source: Ambulatory Visit | Attending: Internal Medicine | Admitting: Internal Medicine

## 2015-07-14 ENCOUNTER — Encounter (HOSPITAL_COMMUNITY)
Admission: RE | Disposition: A | Discharge status: Home / Self Care | Payer: Self-pay | Source: Ambulatory Visit | Attending: Internal Medicine

## 2015-07-14 ENCOUNTER — Encounter (HOSPITAL_COMMUNITY): Payer: Self-pay | Admitting: General Practice

## 2015-07-14 DIAGNOSIS — Z79899 Other long term (current) drug therapy: Secondary | ICD-10-CM | POA: Diagnosis not present

## 2015-07-14 DIAGNOSIS — Z85038 Personal history of other malignant neoplasm of large intestine: Secondary | ICD-10-CM | POA: Insufficient documentation

## 2015-07-14 DIAGNOSIS — Z7901 Long term (current) use of anticoagulants: Secondary | ICD-10-CM | POA: Diagnosis not present

## 2015-07-14 DIAGNOSIS — Z8543 Personal history of malignant neoplasm of ovary: Secondary | ICD-10-CM | POA: Insufficient documentation

## 2015-07-14 DIAGNOSIS — Z87891 Personal history of nicotine dependence: Secondary | ICD-10-CM | POA: Diagnosis not present

## 2015-07-14 DIAGNOSIS — Z79890 Hormone replacement therapy (postmenopausal): Secondary | ICD-10-CM | POA: Insufficient documentation

## 2015-07-14 DIAGNOSIS — I480 Paroxysmal atrial fibrillation: Secondary | ICD-10-CM | POA: Diagnosis not present

## 2015-07-14 DIAGNOSIS — I447 Left bundle-branch block, unspecified: Secondary | ICD-10-CM | POA: Insufficient documentation

## 2015-07-14 DIAGNOSIS — I2510 Atherosclerotic heart disease of native coronary artery without angina pectoris: Secondary | ICD-10-CM | POA: Insufficient documentation

## 2015-07-14 DIAGNOSIS — I428 Other cardiomyopathies: Secondary | ICD-10-CM | POA: Insufficient documentation

## 2015-07-14 DIAGNOSIS — E785 Hyperlipidemia, unspecified: Secondary | ICD-10-CM | POA: Diagnosis not present

## 2015-07-14 DIAGNOSIS — I5022 Chronic systolic (congestive) heart failure: Secondary | ICD-10-CM | POA: Diagnosis present

## 2015-07-14 DIAGNOSIS — I110 Hypertensive heart disease with heart failure: Secondary | ICD-10-CM | POA: Insufficient documentation

## 2015-07-14 DIAGNOSIS — Z95810 Presence of automatic (implantable) cardiac defibrillator: Secondary | ICD-10-CM

## 2015-07-14 DIAGNOSIS — I429 Cardiomyopathy, unspecified: Secondary | ICD-10-CM | POA: Diagnosis not present

## 2015-07-14 HISTORY — DX: Gastro-esophageal reflux disease without esophagitis: K21.9

## 2015-07-14 HISTORY — DX: Presence of automatic (implantable) cardiac defibrillator: Z95.810

## 2015-07-14 HISTORY — DX: Unspecified osteoarthritis, unspecified site: M19.90

## 2015-07-14 HISTORY — PX: INSERTION OF ICD: SHX6689

## 2015-07-14 HISTORY — PX: EP IMPLANTABLE DEVICE: SHX172B

## 2015-07-14 HISTORY — DX: Heart failure, unspecified: I50.9

## 2015-07-14 LAB — SURGICAL PCR SCREEN
MRSA, PCR: NEGATIVE
Staphylococcus aureus: NEGATIVE

## 2015-07-14 LAB — PROTIME-INR
INR: 1.35 (ref 0.00–1.49)
Prothrombin Time: 16.8 seconds — ABNORMAL HIGH (ref 11.6–15.2)

## 2015-07-14 SURGERY — BIV ICD INSERTION CRT-D
Anesthesia: LOCAL

## 2015-07-14 MED ORDER — SODIUM CHLORIDE 0.9 % IV SOLN
INTRAVENOUS | Status: DC
  Administered 2015-07-14: 12:00:00 via INTRAVENOUS

## 2015-07-14 MED ORDER — CHLORHEXIDINE GLUCONATE 4 % EX LIQD
60.0000 mL | Freq: Once | CUTANEOUS | Status: DC
Start: 2015-07-14 — End: 2015-07-14

## 2015-07-14 MED ORDER — SODIUM CHLORIDE 0.9% FLUSH: 3.0000 mL | Freq: Two times a day (BID) | INTRAVENOUS | Status: DC

## 2015-07-14 MED ORDER — VANCOMYCIN HCL IN DEXTROSE 1-5 GM/200ML-% IV SOLN
1000.0000 mg | Freq: Two times a day (BID) | INTRAVENOUS | Status: AC
  Administered 2015-07-15: 1000 mg via INTRAVENOUS
  Filled 2015-07-14: qty 200

## 2015-07-14 MED ORDER — HEPARIN (PORCINE) IN NACL 2-0.9 UNIT/ML-% IJ SOLN: INTRAMUSCULAR | Status: DC | PRN

## 2015-07-14 MED ORDER — LOSARTAN POTASSIUM 25 MG PO TABS: 25.0000 mg | ORAL_TABLET | Freq: Two times a day (BID) | ORAL | Status: DC

## 2015-07-14 MED ORDER — MUPIROCIN 2 % EX OINT
TOPICAL_OINTMENT | CUTANEOUS | Status: AC
  Administered 2015-07-14: 1 via TOPICAL
  Filled 2015-07-14: qty 22

## 2015-07-14 MED ORDER — CEFAZOLIN SODIUM-DEXTROSE 2-3 GM-% IV SOLR
INTRAVENOUS | Status: AC
  Filled 2015-07-14: qty 50

## 2015-07-14 MED ORDER — MAGNESIUM OXIDE 400 (241.3 MG) MG PO TABS
800.0000 mg | ORAL_TABLET | Freq: Two times a day (BID) | ORAL | Status: DC
  Administered 2015-07-14: 800 mg via ORAL
  Filled 2015-07-14: qty 2

## 2015-07-14 MED ORDER — AMIODARONE HCL 100 MG PO TABS
100.0000 mg | ORAL_TABLET | Freq: Every day | ORAL | Status: DC
  Administered 2015-07-14: 100 mg via ORAL
  Filled 2015-07-14: qty 1

## 2015-07-14 MED ORDER — MIDAZOLAM HCL 5 MG/5ML IJ SOLN
INTRAMUSCULAR | Status: AC
  Filled 2015-07-14: qty 5

## 2015-07-14 MED ORDER — FENTANYL CITRATE (PF) 100 MCG/2ML IJ SOLN
INTRAMUSCULAR | Status: DC | PRN
  Administered 2015-07-14 (×3): 12.5 ug via INTRAVENOUS
  Administered 2015-07-14: 25 ug via INTRAVENOUS

## 2015-07-14 MED ORDER — POTASSIUM CHLORIDE CRYS ER 20 MEQ PO TBCR: 40.0000 meq | EXTENDED_RELEASE_TABLET | Freq: Every day | ORAL | Status: DC

## 2015-07-14 MED ORDER — HEPARIN (PORCINE) IN NACL 2-0.9 UNIT/ML-% IJ SOLN
INTRAMUSCULAR | Status: DC | PRN
  Administered 2015-07-14: 14:00:00

## 2015-07-14 MED ORDER — LIDOCAINE HCL (PF) 1 % IJ SOLN
INTRAMUSCULAR | Status: DC | PRN
  Administered 2015-07-14: 35 mL via INTRADERMAL

## 2015-07-14 MED ORDER — ZOLPIDEM TARTRATE 5 MG PO TABS
5.0000 mg | ORAL_TABLET | Freq: Every evening | ORAL | Status: DC | PRN
  Filled 2015-07-14: qty 1

## 2015-07-14 MED ORDER — ATORVASTATIN CALCIUM 40 MG PO TABS
40.0000 mg | ORAL_TABLET | Freq: Every day | ORAL | Status: DC
  Administered 2015-07-14: 40 mg via ORAL
  Filled 2015-07-14: qty 1

## 2015-07-14 MED ORDER — POTASSIUM CHLORIDE CRYS ER 20 MEQ PO TBCR: 20.0000 meq | EXTENDED_RELEASE_TABLET | Freq: Two times a day (BID) | ORAL | Status: DC

## 2015-07-14 MED ORDER — SODIUM CHLORIDE 0.9 % IR SOLN
80.0000 mg | Status: AC
  Administered 2015-07-14: 80 mg

## 2015-07-14 MED ORDER — ESTROGENS, CONJUGATED 0.625 MG/GM VA CREA
1.0000 | TOPICAL_CREAM | VAGINAL | Status: DC | PRN
  Filled 2015-07-14: qty 30

## 2015-07-14 MED ORDER — TRAMADOL HCL 50 MG PO TABS: 50.0000 mg | ORAL_TABLET | Freq: Four times a day (QID) | ORAL | Status: DC | PRN

## 2015-07-14 MED ORDER — LIDOCAINE HCL (PF) 1 % IJ SOLN
INTRAMUSCULAR | Status: AC
  Filled 2015-07-14: qty 60

## 2015-07-14 MED ORDER — IPRATROPIUM BROMIDE 0.06 % NA SOLN
2.0000 | Freq: Two times a day (BID) | NASAL | Status: DC
  Filled 2015-07-14: qty 30

## 2015-07-14 MED ORDER — ACETAMINOPHEN 325 MG PO TABS: 325.0000 mg | ORAL_TABLET | ORAL | Status: DC | PRN

## 2015-07-14 MED ORDER — BUSPIRONE HCL 5 MG PO TABS: 5.0000 mg | ORAL_TABLET | Freq: Every day | ORAL | Status: DC | PRN

## 2015-07-14 MED ORDER — MUPIROCIN 2 % EX OINT
1.0000 "application " | TOPICAL_OINTMENT | Freq: Once | CUTANEOUS | Status: AC
  Administered 2015-07-14: 1 via TOPICAL
  Filled 2015-07-14: qty 22

## 2015-07-14 MED ORDER — ONDANSETRON HCL 4 MG/2ML IJ SOLN: 4.0000 mg | Freq: Four times a day (QID) | INTRAMUSCULAR | Status: DC | PRN

## 2015-07-14 MED ORDER — COQ10 100 MG PO CAPS: 100.0000 mg | ORAL_CAPSULE | Freq: Every day | ORAL | Status: DC

## 2015-07-14 MED ORDER — SPIRONOLACTONE 25 MG PO TABS
25.0000 mg | ORAL_TABLET | Freq: Every evening | ORAL | Status: DC
  Administered 2015-07-14: 25 mg via ORAL
  Filled 2015-07-14: qty 1

## 2015-07-14 MED ORDER — FENTANYL CITRATE (PF) 100 MCG/2ML IJ SOLN
INTRAMUSCULAR | Status: AC
  Filled 2015-07-14: qty 2

## 2015-07-14 MED ORDER — SODIUM CHLORIDE 0.9% FLUSH: 3.0000 mL | INTRAVENOUS | Status: DC | PRN

## 2015-07-14 MED ORDER — HEPARIN (PORCINE) IN NACL 2-0.9 UNIT/ML-% IJ SOLN
INTRAMUSCULAR | Status: AC
  Filled 2015-07-14: qty 500

## 2015-07-14 MED ORDER — TRETINOIN 0.1 % EX CREA: 1.0000 "application " | TOPICAL_CREAM | Freq: Every morning | CUTANEOUS | Status: DC

## 2015-07-14 MED ORDER — SODIUM CHLORIDE 0.9 % IR SOLN
Status: AC
  Filled 2015-07-14: qty 2

## 2015-07-14 MED ORDER — POTASSIUM CHLORIDE CRYS ER 20 MEQ PO TBCR
20.0000 meq | EXTENDED_RELEASE_TABLET | Freq: Every day | ORAL | Status: DC
  Administered 2015-07-14: 20 meq via ORAL
  Filled 2015-07-14: qty 1

## 2015-07-14 MED ORDER — MIDAZOLAM HCL 5 MG/5ML IJ SOLN
INTRAMUSCULAR | Status: DC | PRN
  Administered 2015-07-14 (×6): 1 mg via INTRAVENOUS

## 2015-07-14 MED ORDER — CEFAZOLIN SODIUM-DEXTROSE 2-4 GM/100ML-% IV SOLN
2.0000 g | INTRAVENOUS | Status: AC
  Administered 2015-07-14: 2 g via INTRAVENOUS

## 2015-07-14 MED ORDER — FUROSEMIDE 40 MG PO TABS
40.0000 mg | ORAL_TABLET | Freq: Two times a day (BID) | ORAL | Status: DC
  Administered 2015-07-14: 40 mg via ORAL
  Filled 2015-07-14: qty 1

## 2015-07-14 MED ORDER — IOHEXOL 350 MG/ML SOLN
INTRAVENOUS | Status: DC | PRN
  Administered 2015-07-14: 12 mL via INTRACARDIAC
  Administered 2015-07-14: 10 mL via INTRACARDIAC

## 2015-07-14 MED ORDER — NITROFURANTOIN MONOHYD MACRO 100 MG PO CAPS
100.0000 mg | ORAL_CAPSULE | Freq: Every day | ORAL | Status: DC
  Administered 2015-07-14: 100 mg via ORAL
  Filled 2015-07-14: qty 1

## 2015-07-14 MED ORDER — SODIUM CHLORIDE 0.9 % IV SOLN: 250.0000 mL | INTRAVENOUS | Status: DC

## 2015-07-14 MED ORDER — METOPROLOL SUCCINATE ER 25 MG PO TB24: 25.0000 mg | ORAL_TABLET | Freq: Every day | ORAL | Status: DC

## 2015-07-14 SURGICAL SUPPLY — 21 items
ADAPTER SEALING SSA-EW-09 (MISCELLANEOUS) ×2 IMPLANT
BALLN ATTAIN 80 (BALLOONS) ×2
BALLOON ATTAIN 80 (BALLOONS) ×1 IMPLANT
CABLE SURGICAL S-101-97-12 (CABLE) ×2 IMPLANT
CATH ATTAIN COMMAND 6250-MB2 (CATHETERS) ×2 IMPLANT
CATH HEX JOS 2-5-2 65CM 6F REP (CATHETERS) ×2 IMPLANT
ICD VIVA QUAD XT CRT-D DTBA1Q1 (ICD Generator) ×2 IMPLANT
KIT ESSENTIALS PG (KITS) ×2 IMPLANT
LEAD ATTAIN PERFORMA S 4598-88 (Lead) ×2 IMPLANT
LEAD CAPSURE NOVUS 45CM (Lead) ×2 IMPLANT
LEAD SPRINT QUAT SEC 6935-58CM (Lead) ×2 IMPLANT
PAD DEFIB LIFELINK (PAD) ×2 IMPLANT
SHEATH CLASSIC 7F (SHEATH) ×2 IMPLANT
SHEATH CLASSIC 9.5F (SHEATH) ×2 IMPLANT
SHEATH CLASSIC 9F (SHEATH) ×2 IMPLANT
SLITTER 6232ADJ (MISCELLANEOUS) ×2 IMPLANT
TOOL TRANSVALV INSERT TVI-07 (MISCELLANEOUS) ×2 IMPLANT
TRAY PACEMAKER INSERTION (PACKS) ×2 IMPLANT
WIRE ACUITY WHISPER EDS 4648 (WIRE) ×2 IMPLANT
WIRE LUGE 182CM (WIRE) ×2 IMPLANT
WIRE MAILMAN 182CM (WIRE) ×2 IMPLANT

## 2015-07-14 NOTE — Discharge Summary (Signed)
ELECTROPHYSIOLOGY PROCEDURE DISCHARGE SUMMARY    Patient ID: Cynthia Frank,  MRN: [id number], DOB/AGE: [birth year] 78 y.o.  Admit date: 07/14/2015 Discharge date: 07/15/15  Primary Care Physician: [name], MD Primary Cardiologist: Dr. Radford Pax CHF: Dr. Aundra Dubin Electrophysiologist: Dr. Lovena Le  Primary Discharge Diagnosis:  1. NICM, chronic CHF  Secondary Discharge Diagnosis:  1. PAFib     CHA2DS2Vasc is at least 5 on Warfarin      2. HTN   Allergies  Allergen Reactions  . Clindamycin/Lincomycin     itching  . Pneumococcal Vaccines Itching and Swelling    Redness   . Sulfonamide Derivatives Itching and Swelling     Procedures This Admission:  1.  Implantation of a CRT-D on 07/14/15 by Dr Lovena Le.  The patient received a Medtronic (serial Number [id number]) biventricular ICD, Medtronic (serial # [id number] ) right atrial lead and a Medtronic (serial number [id number]) right ventricular defibrillator lead, and a Medtronic Quadrapolar lead, (serial number [id number]) LV lead.   DFT's were deferred at time of implant.  There were no immediate post procedure complications. 2.  CXR on 07/15/15 demonstrated no pneumothorax status post device implantation.   Brief HPI: Cynthia Frank is a 80 y.o. female was referred to electrophysiology in the outpatient setting for consideration of ICD implantation.  Past medical history includes NICM, LBBB, chronic CHF, HTN.  The patient has persistent LV dysfunction despite guideline directed therapy.  Risks, benefits, and alternatives to ICD implantation were reviewed with the patient who wished to proceed.   Hospital Course:  The patient was admitted and underwent implantation of a CRT-D with details as outlined above.She was monitored on telemetry overnight which demonstrated SR  Left chest was without hematoma or ecchymosis.  The device was interrogated and found to be functioning normally.  CXR was obtained and  demonstrated no pneumothorax status post device implantation.  Wound care, arm mobility, and restrictions were reviewed with the patient.  The patient was examined by Dr. Lovena Le and considered stable for discharge to home.  Her warfarin is managed by the coumadin clinic and the next INR is scheduled for 07/20/15 which the patient is aware of  The patient's discharge medications include an ARB (Cozaar) and beta blocker (Toprol).   Physical Exam: Filed Vitals:   07/14/15 1526 07/14/15 1605 07/14/15 2220 07/15/15 0618  BP: 119/71 113/67 82/55 104/69  Pulse: 60 59 60 60  Temp:   97.7 F (36.5 C) 97.4 F (36.3 C)  TempSrc:   Oral Oral  Resp: 18 13 16 15   Height:      Weight:    124 lb 12.5 oz (56.6 kg)  SpO2: 99% 97% 95% 95%    GEN- The patient is well appearing, alert and oriented x 3 today.   HEENT: normocephalic, atraumatic; sclera clear, conjunctiva pink; hearing intact; oropharynx clear Lungs- Clear to ausculation bilaterally, normal work of breathing.  No wheezes, rales, rhonchi Heart- Regular rate and rhythm, no murmurs, rubs or gallops, PMI not laterally displaced GI- soft, non-tender, non-distended, bowel sounds present Extremities- no clubbing, cyanosis, or edema MS- no significant deformity or atrophy Skin- warm and dry, no rash or lesion, left chest without hematoma/ecchymosis Psych- euthymic mood, full affect Neuro- no gross defecits  Labs:   Lab Results  Component Value Date   WBC 6.4 07/10/2015   HGB 13.5 07/10/2015   HCT 40.0 07/10/2015   MCV 89.7 07/10/2015   PLT 150 07/10/2015     Recent Labs Lab  07/10/15 1028  NA 135  K 4.0  CL 97*  CO2 26  BUN 27*  CREATININE 1.18*  CALCIUM 8.8  GLUCOSE 98    Discharge Medications:    Medication List    TAKE these medications        amiodarone 200 MG tablet  Commonly known as:  PACERONE  Take 0.5 tablets (100 mg total) by mouth daily.     atorvastatin 40 MG tablet  Commonly known as:  LIPITOR  Take 1  tablet (40 mg total) by mouth daily.     busPIRone 5 MG tablet  Commonly known as:  BUSPAR  Take 1 tablet (5 mg total) by mouth daily as needed (anxiety).     conjugated estrogens vaginal cream  Commonly known as:  PREMARIN  Place 1 Applicatorful vaginally as needed (dyspareunia). Reported on 05/11/2015     CoQ10 100 MG Caps  Take 100 mg by mouth daily.     diclofenac sodium 1 % Gel  Commonly known as:  VOLTAREN  APPLY 2 GRAMS TOPICALLY 4TIMES DAILY     furosemide 40 MG tablet  Commonly known as:  LASIX  Take 1 tablet (40 mg total) by mouth 2 (two) times daily.     ipratropium 0.03 % nasal spray  Commonly known as:  ATROVENT  Place 2 sprays into both nostrils 2 (two) times daily.     losartan 25 MG tablet  Commonly known as:  COZAAR  Take 1 tablet (25 mg total) by mouth 2 (two) times daily.     MACROBID 100 MG capsule  Generic drug:  nitrofurantoin (macrocrystal-monohydrate)  Take 100 mg by mouth at bedtime. Will be on it for 6 months     magnesium oxide 400 MG tablet  Commonly known as:  MAG-OX  Take 800 mg by mouth 2 (two) times daily.     metoprolol succinate 25 MG 24 hr tablet  Commonly known as:  TOPROL-XL  TAKE 1 TABLET BY MOUTH EVERY DAY     potassium chloride SA 20 MEQ tablet  Commonly known as:  K-DUR,KLOR-CON  Take 20-40 mEq by mouth 2 (two) times daily. Take 2 tablets (40 meq) in the morning and 1 tablet (20 meq) in the afternoon     spironolactone 25 MG tablet  Commonly known as:  ALDACTONE  Take 1 tablet (25 mg total) by mouth every evening.     traMADol 50 MG tablet  Commonly known as:  ULTRAM  Take 50 mg by mouth every 6 (six) hours as needed for moderate pain. Reported on 07/03/2015     tretinoin 0.1 % cream  Commonly known as:  RETIN-A  Apply 1 application topically every morning.     warfarin 2.5 MG tablet  Commonly known as:  COUMADIN  Take 1.25 mg by mouth daily.     warfarin 2.5 MG tablet  Commonly known as:  COUMADIN  TAKE AS DIRECTED  BY COUMADIN CLINIC     zolpidem 5 MG tablet  Commonly known as:  AMBIEN  Take 5 mg by mouth at bedtime as needed for sleep.        Disposition:  Home Discharge Instructions    Diet - low sodium heart healthy    Complete by:  As directed      Increase activity slowly    Complete by:  As directed           Follow-up Information    Follow up with [facility] On  07/27/2015.   Specialty:  Cardiology   Why:  9:00AM, wound check   Contact information:   [address], [place] [phone]      Follow up with Cristopher Peru, MD On 10/14/2015.   Specialty:  Cardiology   Why:  9:00AM   Contact information:   [id number] [address] [phone]       Follow up with [facility] On 07/20/2015.   Specialty:  Cardiology   Why:  9:30AM, lab and coumadin clinic visit   Contact information:   [address], [place] [address] [phone]      Duration of Discharge Encounter: Greater than 30 minutes including physician time.  Venetia Night, PA-C 07/15/2015 8:26 AM  EP attending  Patient seen and examined. Agree with above. Her Biv ICD is working normally. Will plan to proceed with discharge home with usual followup.   Mikle Bosworth.D.

## 2015-07-14 NOTE — Progress Notes (Signed)

## 2015-07-14 NOTE — Progress Notes (Signed)
Pt in cath holding till [address] is able to receive report. Pt is awake,alert, oriented and pain free.Left arm sling in place Dressing on left upper anterior chest wall clean  , dry and intact. Site is level zero.

## 2015-07-14 NOTE — H&P (Signed)
HPI Cynthia Frank is referred today by Dr. Aundra Dubin for consideration for ICD implantation. She is a pleasant 80 yo woman with a h/o ovarian CA, s/p surgery and chemotherapy including adriamycin (1985), atrial fib well controlled with amiodarone, IVCD/LBBB with a QRS of 164, and dysynchrony on MRI and class 2 CHF. She has a h/o syncope, the last episode occuring approx 4 years ago. She remains active. Allergies  Allergen Reactions  . Clindamycin/Lincomycin     itching  . Pneumococcal Vaccines Itching and Swelling    Redness   . Sulfonamide Derivatives Itching and Swelling     Current Outpatient Prescriptions  Medication Sig Dispense Refill  . amiodarone (PACERONE) 200 MG tablet Take 0.5 tablets (100 mg total) by mouth daily. 90 tablet 3  . atorvastatin (LIPITOR) 40 MG tablet Take 1 tablet (40 mg total) by mouth daily. 90 tablet 3  . busPIRone (BUSPAR) 5 MG tablet Take 1 tablet (5 mg total) by mouth daily as needed (anxiety). 30 tablet 3  . Coenzyme Q10 (COQ10) 100 MG CAPS Take 100 mg by mouth daily.     Marland Kitchen conjugated estrogens (PREMARIN) vaginal cream Place 1 Applicatorful vaginally as needed (dyspareunia). Reported on 05/11/2015    . diclofenac sodium (VOLTAREN) 1 % GEL APPLY 2 GRAMS TOPICALLY 4TIMES DAILY 100 g 3  . furosemide (LASIX) 40 MG tablet Take 1 tablet (40 mg total) by mouth 2 (two) times daily. 90 tablet 3  . ipratropium (ATROVENT) 0.03 % nasal spray Place 2 sprays into both nostrils 2 (two) times daily.  11  . losartan (COZAAR) 25 MG tablet Take 1 tablet (25 mg total) by mouth every morning. 30 tablet 3  . magnesium oxide (MAG-OX) 400 MG tablet Take 800 mg by mouth 2 (two) times daily.    . metoprolol succinate (TOPROL-XL) 25 MG 24 hr tablet TAKE 1 TABLET BY MOUTH EVERY DAY 90 tablet 3  . nitrofurantoin, macrocrystal-monohydrate, (MACROBID) 100 MG capsule Take 100 mg by mouth at bedtime.  Will be on it for 6 months    . potassium chloride SA (K-DUR,KLOR-CON) 20 MEQ tablet Take 20-40 mEq by mouth 2 (two) times daily. Take 2 tablets (40 meq) in the morning and 1 tablet (20 meq) in the afternoon    . spironolactone (ALDACTONE) 25 MG tablet Take 1 tablet (25 mg total) by mouth every evening. 30 tablet 3  . traMADol (ULTRAM) 50 MG tablet Take 50 mg by mouth every 6 (six) hours as needed for moderate pain. Reported on 05/11/2015    . tretinoin (RETIN-A) 0.1 % cream Apply 1 application topically every morning.     . warfarin (COUMADIN) 2.5 MG tablet TAKE AS DIRECTED BY COUMADIN CLINIC 90 tablet 0  . zolpidem (AMBIEN) 5 MG tablet Take 5 mg by mouth at bedtime as needed for sleep.     No current facility-administered medications for this visit.     Past Medical History  Diagnosis Date  . Hx: UTI (urinary tract infection)   . Nonischemic cardiomyopathy ([place])     last EF assessment 40% by MUGA  . History of ovarian cancer 1985  . Partial bowel obstruction ([place])   . Internal hemorrhoid   . Colon cancer ([place]) 03/2006    T3, N0  . Colon polyps 12/07/2010  . Hypertension   . Atrial fibrillation ([place])   . PVC (premature ventricular contraction)   . Coronary artery disease     nonobstructive with 30% D1  . Hyperlipidemia  ROS:  All systems reviewed and negative except as noted in the HPI.   Past Surgical History  Procedure Laterality Date  . Abdominal hysterectomy    . Oophorectomy      '61  . Tonsillectomy      '44  . Resection of right ovarian cancer '84, repeat '86    . Laparotomy      '80 adhesions  . Resection due to partial bowel obstruction      1985  . Colon surgery      for colon cancer in 2007  . Laparotomy for takedown of intestinal obstruction secondary to adhesions      1989  . Ankle closed reduction   10/16/2011    Procedure: CLOSED REDUCTION ANKLE; Surgeon: Tobi Bastos, MD; Location: [facility] ORS; Service: Orthopedics; Laterality: Left;  . Colonoscopy N/A 03/11/2013    Procedure: COLONOSCOPY; Surgeon: Ladene Artist, MD; Location: [facility] ENDOSCOPY; Service: Endoscopy; Laterality: N/A;  . Esophagogastroduodenoscopy (egd) with propofol N/A 04/21/2015    Procedure: ESOPHAGOGASTRODUODENOSCOPY (EGD) WITH PROPOFOL; Surgeon: Ladene Artist, MD; Location: [facility] ENDOSCOPY; Service: Endoscopy; Laterality: N/A;  . Colonoscopy with propofol N/A 04/21/2015    Procedure: COLONOSCOPY WITH PROPOFOL; Surgeon: Ladene Artist, MD; Location: [facility] ENDOSCOPY; Service: Endoscopy; Laterality: N/A;     Family History  Problem Relation Age of Onset  . Uterine cancer Mother   . Prostate cancer Father   . Heart disease Father   . Heart attack Father   . Colon cancer Neg Hx      Social History   Social History  . Marital Status: Married    Spouse Name: N/A  . Number of Children: 2  . Years of Education: N/A   Occupational History  . retired    Social History Main Topics  . Smoking status: Former Smoker -- 0.50 packs/day for 20 years    Types: Cigarettes    Quit date: 04/18/1978  . Smokeless tobacco: Not on file  . Alcohol Use: 8.4 oz/week    14 Glasses of wine per week     Comment: has wine before dinner   . Drug Use: No  . Sexual Activity: Not on file   Other Topics Concern  . Not on file   Social History Narrative   Patient had never smoked.   Alcohol use- yes   Daily caffeine use- coffee   Illicit drug use- no   Occupation:retired      BP 98/66 mmHg  Pulse 64  Ht 5\' 4"  (1.626 m)  Wt 128 lb (58.06 kg)  BMI 21.96 kg/m2  Physical Exam:  Well appearing 80 yo woman, NAD HEENT: Unremarkable Neck: 6 cm JVD, no thyromegally Lymphatics: No  adenopathy Back: No CVA tenderness Lungs: Clear with no wheezes HEART: Regular rate rhythm, no murmurs, no rubs, no clicks Abd: soft, positive bowel sounds, no organomegally, no rebound, no guarding Ext: 2 plus pulses, no edema, no cyanosis, no clubbing Skin: No rashes no nodules Neuro: CN II through XII intact, motor grossly intact  EKG - NSR with IVCD  Assess/Plan: 1. Atrial fibrillation - she is maintaining NSR on amiodarone 2. Chronic systolic heart failure - her symptoms are class 2. We discussed ICD and BiV PPM insertion in detail. I have reviewed the risks/benefit/goals/expectations of device implant including both BiV PPM or ICD and she wishes to proceed.  3. HTN - her blood pressure is well controlled. Will follow 4. PVC's - she is well controlled. She will continue her amiodarone.  Carleene Overlie  Taylor,M.D       EP Attending  Patient seen and examined. Agree with the findings as noted above. Will plan to proceed with BiV ICD implant with EF 29%, LBBB and documented lateral to septal dysychrony, and class 2-3 CHF.   Mikle Bosworth.D.

## 2015-07-14 NOTE — Discharge Instructions (Signed)
. ° ° °  Supplemental Discharge Instructions for  Pacemaker/Defibrillator Patients  Activity No heavy lifting or vigorous activity with your left/right arm for 6 to 8 weeks.  Do not raise your left/right arm above your head for one week.  Gradually raise your affected arm as drawn below.             07/19/15                       07/20/15                       07/21/15                     07/22/15 __  NO DRIVING for 1 week    ; you may begin driving on [id number]  .  WOUND CARE - Keep the wound area clean and dry.  Do not get this area wet for one week. No showers for one week; you may shower on  07/22/15   . - The tape/steri-strips on your wound will fall off; do not pull them off.  No bandage is needed on the site.  DO  NOT apply any creams, oils, or ointments to the wound area. - If you notice any drainage or discharge from the wound, any swelling or bruising at the site, or you develop a fever > 101? F after you are discharged home, call the office at once.  Special Instructions - You are still able to use cellular telephones; use the ear opposite the side where you have your pacemaker/defibrillator.  Avoid carrying your cellular phone near your device. - When traveling through airports, show security personnel your identification card to avoid being screened in the metal detectors.  Ask the security personnel to use the hand wand. - Avoid arc welding equipment, MRI testing (magnetic resonance imaging), TENS units (transcutaneous nerve stimulators).  Call the office for questions about other devices. - Avoid electrical appliances that are in poor condition or are not properly grounded. - Microwave ovens are safe to be near or to operate.  Additional information for defibrillator patients should your device go off: - If your device goes off ONCE and you feel fine afterward, notify the device clinic nurses. - If your device goes off ONCE and you do not feel well afterward, call 911. - If your device  goes off TWICE, call 911. - If your device goes off THREE times in one day, call 911.  DO NOT DRIVE YOURSELF OR A FAMILY MEMBER WITH A DEFIBRILLATOR TO THE HOSPITAL--CALL 911.

## 2015-07-14 NOTE — H&P (Signed)
  ICD Criteria  Current LVEF:29%. Within 12 months prior to implant: Yes   Heart failure history: Yes, Class II  Cardiomyopathy history: Yes, Non-Ischemic Cardiomyopathy.  Atrial Fibrillation/Atrial Flutter: Yes, Paroxysmal.  Ventricular tachycardia history: No.  Cardiac arrest history: No.  History of syndromes with risk of sudden death: No.  Previous ICD: No.  Current ICD indication: Primary  PPM indication: No.   Class I or II Bradycardia indication present: No  Beta Blocker therapy for 3 or more months: Yes, prescribed.   Ace Inhibitor/ARB therapy for 3 or more months: Yes, prescribed.

## 2015-07-15 ENCOUNTER — Telehealth: Payer: Self-pay | Admitting: *Deleted

## 2015-07-15 ENCOUNTER — Ambulatory Visit (HOSPITAL_COMMUNITY): Payer: Medicare Other

## 2015-07-15 ENCOUNTER — Encounter (HOSPITAL_COMMUNITY): Payer: Self-pay | Admitting: Internal Medicine

## 2015-07-15 DIAGNOSIS — I2510 Atherosclerotic heart disease of native coronary artery without angina pectoris: Secondary | ICD-10-CM | POA: Diagnosis not present

## 2015-07-15 DIAGNOSIS — I110 Hypertensive heart disease with heart failure: Secondary | ICD-10-CM | POA: Diagnosis not present

## 2015-07-15 DIAGNOSIS — I428 Other cardiomyopathies: Secondary | ICD-10-CM | POA: Diagnosis not present

## 2015-07-15 DIAGNOSIS — J9811 Atelectasis: Secondary | ICD-10-CM | POA: Diagnosis not present

## 2015-07-15 DIAGNOSIS — I429 Cardiomyopathy, unspecified: Secondary | ICD-10-CM | POA: Diagnosis not present

## 2015-07-15 DIAGNOSIS — I447 Left bundle-branch block, unspecified: Secondary | ICD-10-CM | POA: Diagnosis not present

## 2015-07-15 DIAGNOSIS — Z95810 Presence of automatic (implantable) cardiac defibrillator: Secondary | ICD-10-CM | POA: Diagnosis not present

## 2015-07-15 DIAGNOSIS — I480 Paroxysmal atrial fibrillation: Secondary | ICD-10-CM | POA: Diagnosis not present

## 2015-07-15 DIAGNOSIS — I5022 Chronic systolic (congestive) heart failure: Secondary | ICD-10-CM | POA: Diagnosis not present

## 2015-07-15 MED FILL — Sodium Chloride Irrigation Soln 0.9%: Qty: 500 | Status: AC

## 2015-07-15 MED FILL — Gentamicin Sulfate Inj 40 MG/ML: INTRAMUSCULAR | Qty: 2 | Status: AC

## 2015-07-15 NOTE — Telephone Encounter (Signed)
Pt was on TCM list admitted for NICM, Chronic CHF, and AF. D/C 07/15/15 she will be f/u w/cardiology Dr. Lovena Le...Johny Chess

## 2015-07-17 ENCOUNTER — Other Ambulatory Visit: Payer: Self-pay | Admitting: Cardiology

## 2015-07-20 ENCOUNTER — Ambulatory Visit (INDEPENDENT_AMBULATORY_CARE_PROVIDER_SITE_OTHER): Payer: Medicare Other | Admitting: Pharmacist

## 2015-07-20 ENCOUNTER — Ambulatory Visit (HOSPITAL_COMMUNITY)
Admission: RE | Admit: 2015-07-20 | Discharge: 2015-07-20 | Disposition: A | Discharge status: Home / Self Care | Payer: Medicare Other | Source: Ambulatory Visit | Attending: Internal Medicine | Admitting: Internal Medicine

## 2015-07-20 DIAGNOSIS — I5022 Chronic systolic (congestive) heart failure: Secondary | ICD-10-CM | POA: Insufficient documentation

## 2015-07-20 DIAGNOSIS — I4891 Unspecified atrial fibrillation: Secondary | ICD-10-CM

## 2015-07-20 LAB — COMPREHENSIVE METABOLIC PANEL
ALT: 67 U/L — ABNORMAL HIGH (ref 14–54)
AST: 64 U/L — ABNORMAL HIGH (ref 15–41)
Albumin: 3.5 g/dL (ref 3.5–5.0)
Alkaline Phosphatase: 68 U/L (ref 38–126)
Anion gap: 11 (ref 5–15)
BUN: 23 mg/dL — ABNORMAL HIGH (ref 6–20)
CO2: 28 mmol/L (ref 22–32)
Calcium: 9 mg/dL (ref 8.9–10.3)
Chloride: 98 mmol/L — ABNORMAL LOW (ref 101–111)
Creatinine, Ser: 1.21 mg/dL — ABNORMAL HIGH (ref 0.44–1.00)
GFR calc Af Amer: 49 mL/min — ABNORMAL LOW (ref >60)
GFR calc non Af Amer: 42 mL/min — ABNORMAL LOW (ref >60)
Glucose, Bld: 106 mg/dL — ABNORMAL HIGH (ref 65–99)
Potassium: 3.6 mmol/L (ref 3.5–5.1)
Sodium: 137 mmol/L (ref 135–145)
Total Bilirubin: 0.8 mg/dL (ref 0.3–1.2)
Total Protein: 7.2 g/dL (ref 6.5–8.1)

## 2015-07-20 LAB — BRAIN NATRIURETIC PEPTIDE: B Natriuretic Peptide: 278.1 pg/mL — ABNORMAL HIGH (ref 0.0–100.0)

## 2015-07-20 LAB — POCT INR: INR: 1.4

## 2015-07-27 ENCOUNTER — Encounter: Payer: Self-pay | Admitting: Internal Medicine

## 2015-07-27 ENCOUNTER — Ambulatory Visit (INDEPENDENT_AMBULATORY_CARE_PROVIDER_SITE_OTHER): Payer: Medicare Other | Admitting: *Deleted

## 2015-07-27 DIAGNOSIS — I5022 Chronic systolic (congestive) heart failure: Secondary | ICD-10-CM

## 2015-07-27 DIAGNOSIS — I429 Cardiomyopathy, unspecified: Secondary | ICD-10-CM

## 2015-07-27 DIAGNOSIS — I428 Other cardiomyopathies: Secondary | ICD-10-CM

## 2015-07-27 LAB — CUP PACEART INCLINIC DEVICE CHECK
Battery Remaining Longevity: 81 mo
Battery Voltage: 3.01 V
Brady Statistic AP VP Percent: 70.5 %
Brady Statistic AP VS Percent: 0.58 %
Brady Statistic AS VP Percent: 28.58 %
Brady Statistic AS VS Percent: 0.34 %
Brady Statistic RA Percent Paced: 71.08 %
Brady Statistic RV Percent Paced: 82.09 %
Date Time Interrogation Session: 20170410105354
HighPow Impedance: 61 Ohm
Implantable Lead Implant Date: 20170328
Implantable Lead Implant Date: 20170328
Implantable Lead Implant Date: 20170328
Implantable Lead Location: 753858
Implantable Lead Location: 753859
Implantable Lead Location: 753860
Implantable Lead Model: 4598
Implantable Lead Model: 5076
Implantable Lead Model: 6935
Lead Channel Impedance Value: 361 Ohm
Lead Channel Impedance Value: 361 Ohm
Lead Channel Impedance Value: 399 Ohm
Lead Channel Impedance Value: 418 Ohm
Lead Channel Impedance Value: 456 Ohm
Lead Channel Impedance Value: 532 Ohm
Lead Channel Impedance Value: 532 Ohm
Lead Channel Impedance Value: 608 Ohm
Lead Channel Impedance Value: 608 Ohm
Lead Channel Impedance Value: 703 Ohm
Lead Channel Impedance Value: 874 Ohm
Lead Channel Impedance Value: 874 Ohm
Lead Channel Impedance Value: 893 Ohm
Lead Channel Pacing Threshold Amplitude: 1 V
Lead Channel Pacing Threshold Amplitude: 1 V
Lead Channel Pacing Threshold Amplitude: 1.125 V
Lead Channel Pacing Threshold Pulse Width: 0.4 ms
Lead Channel Pacing Threshold Pulse Width: 0.4 ms
Lead Channel Pacing Threshold Pulse Width: 0.4 ms
Lead Channel Sensing Intrinsic Amplitude: 1.625 mV
Lead Channel Sensing Intrinsic Amplitude: 1.75 mV
Lead Channel Sensing Intrinsic Amplitude: 11.875 mV
Lead Channel Sensing Intrinsic Amplitude: 9.5 mV
Lead Channel Setting Pacing Amplitude: 2.75 V
Lead Channel Setting Pacing Amplitude: 3.5 V
Lead Channel Setting Pacing Amplitude: 3.5 V
Lead Channel Setting Pacing Pulse Width: 0.4 ms
Lead Channel Setting Pacing Pulse Width: 0.4 ms
Lead Channel Setting Sensing Sensitivity: 0.3 mV

## 2015-07-27 NOTE — Progress Notes (Signed)
Wound check appointment. Steri-strips removed. Wound without redness or ecchymosis. Small amount of edema noted---soft upon palpation--patient encouraged to call if she ever notices an increase in size. Incision edges approximated, wound well healed. Normal device function. Thresholds, sensing, and impedances consistent with implant measurements. Device programmed at 3.5V for extra safety margin until 3 month visit. Histogram distribution appropriate for patient and level of activity. No mode switches or ventricular arrhythmias noted. Patient educated about wound care, arm mobility, lifting restrictions, shock plan. ROV in 3 months with GT.

## 2015-07-31 ENCOUNTER — Ambulatory Visit (INDEPENDENT_AMBULATORY_CARE_PROVIDER_SITE_OTHER): Payer: Medicare Other | Admitting: *Deleted

## 2015-07-31 DIAGNOSIS — I4891 Unspecified atrial fibrillation: Secondary | ICD-10-CM

## 2015-07-31 LAB — POCT INR: INR: 2.1

## 2015-08-25 ENCOUNTER — Other Ambulatory Visit: Payer: Self-pay | Admitting: Cardiology

## 2015-09-01 ENCOUNTER — Ambulatory Visit (HOSPITAL_COMMUNITY)
Admission: RE | Admit: 2015-09-01 | Discharge: 2015-09-01 | Disposition: A | Discharge status: Home / Self Care | Payer: Medicare Other | Source: Ambulatory Visit | Attending: Cardiology | Admitting: Cardiology

## 2015-09-01 ENCOUNTER — Encounter (HOSPITAL_COMMUNITY): Payer: Self-pay

## 2015-09-01 VITALS — BP 112/74 | HR 62 | Wt 131.2 lb

## 2015-09-01 DIAGNOSIS — I110 Hypertensive heart disease with heart failure: Secondary | ICD-10-CM | POA: Diagnosis not present

## 2015-09-01 DIAGNOSIS — Z8543 Personal history of malignant neoplasm of ovary: Secondary | ICD-10-CM | POA: Insufficient documentation

## 2015-09-01 DIAGNOSIS — R7989 Other specified abnormal findings of blood chemistry: Secondary | ICD-10-CM | POA: Diagnosis not present

## 2015-09-01 DIAGNOSIS — Z79899 Other long term (current) drug therapy: Secondary | ICD-10-CM | POA: Insufficient documentation

## 2015-09-01 DIAGNOSIS — Z95810 Presence of automatic (implantable) cardiac defibrillator: Secondary | ICD-10-CM | POA: Insufficient documentation

## 2015-09-01 DIAGNOSIS — I5022 Chronic systolic (congestive) heart failure: Secondary | ICD-10-CM

## 2015-09-01 DIAGNOSIS — I428 Other cardiomyopathies: Secondary | ICD-10-CM | POA: Insufficient documentation

## 2015-09-01 DIAGNOSIS — Z87891 Personal history of nicotine dependence: Secondary | ICD-10-CM | POA: Diagnosis not present

## 2015-09-01 DIAGNOSIS — Z8249 Family history of ischemic heart disease and other diseases of the circulatory system: Secondary | ICD-10-CM | POA: Insufficient documentation

## 2015-09-01 DIAGNOSIS — Z7901 Long term (current) use of anticoagulants: Secondary | ICD-10-CM | POA: Insufficient documentation

## 2015-09-01 DIAGNOSIS — I480 Paroxysmal atrial fibrillation: Secondary | ICD-10-CM | POA: Diagnosis not present

## 2015-09-01 DIAGNOSIS — K219 Gastro-esophageal reflux disease without esophagitis: Secondary | ICD-10-CM | POA: Insufficient documentation

## 2015-09-01 DIAGNOSIS — Z85038 Personal history of other malignant neoplasm of large intestine: Secondary | ICD-10-CM | POA: Insufficient documentation

## 2015-09-01 LAB — CBC
HCT: 37.6 % (ref 36.0–46.0)
Hemoglobin: 12.6 g/dL (ref 12.0–15.0)
MCH: 31.1 pg (ref 26.0–34.0)
MCHC: 33.5 g/dL (ref 30.0–36.0)
MCV: 92.8 fL (ref 78.0–100.0)
Platelets: 121 10*3/uL — ABNORMAL LOW (ref 150–400)
RBC: 4.05 MIL/uL (ref 3.87–5.11)
RDW: 14.3 % (ref 11.5–15.5)
WBC: 5.9 10*3/uL (ref 4.0–10.5)

## 2015-09-01 LAB — COMPREHENSIVE METABOLIC PANEL
ALT: 61 U/L — ABNORMAL HIGH (ref 14–54)
AST: 53 U/L — ABNORMAL HIGH (ref 15–41)
Albumin: 4 g/dL (ref 3.5–5.0)
Alkaline Phosphatase: 65 U/L (ref 38–126)
Anion gap: 14 (ref 5–15)
BUN: 26 mg/dL — ABNORMAL HIGH (ref 6–20)
CO2: 25 mmol/L (ref 22–32)
Calcium: 9.6 mg/dL (ref 8.9–10.3)
Chloride: 95 mmol/L — ABNORMAL LOW (ref 101–111)
Creatinine, Ser: 1.39 mg/dL — ABNORMAL HIGH (ref 0.44–1.00)
GFR calc Af Amer: 41 mL/min — ABNORMAL LOW (ref >60)
GFR calc non Af Amer: 36 mL/min — ABNORMAL LOW (ref >60)
Glucose, Bld: 99 mg/dL (ref 65–99)
Potassium: 3.4 mmol/L — ABNORMAL LOW (ref 3.5–5.1)
Sodium: 134 mmol/L — ABNORMAL LOW (ref 135–145)
Total Bilirubin: 1.3 mg/dL — ABNORMAL HIGH (ref 0.3–1.2)
Total Protein: 7.3 g/dL (ref 6.5–8.1)

## 2015-09-01 LAB — BRAIN NATRIURETIC PEPTIDE: B Natriuretic Peptide: 306.4 pg/mL — ABNORMAL HIGH (ref 0.0–100.0)

## 2015-09-01 LAB — TSH: TSH: 5.038 u[IU]/mL — ABNORMAL HIGH (ref 0.350–4.500)

## 2015-09-01 MED ORDER — METOPROLOL SUCCINATE ER 25 MG PO TB24: 25.0000 mg | ORAL_TABLET | Freq: Two times a day (BID) | ORAL | Status: DC

## 2015-09-01 NOTE — Patient Instructions (Signed)
Increase Toprol XL to 25mg  twice daily.  Routine lab work today. Will notify you of abnormal results  Your provider requests you have an Echo in 2 months.   Follow up with Dr.McLean in 6 weeks.

## 2015-09-01 NOTE — Progress Notes (Signed)
Patient ID: JAQUIRA EMCH, female   DOB: [birth year], 80 y.o.   MRN: [id number] PCP: Dr. Sharlet Salina HF Cardiology: Dr. Aundra Dubin  80 yo with history of chronic systolic CHF/nonischemic cardiomyopathy, paroxysmal atrial fibrillation, and prior ovarian and colon cancers presents for evaluation of CHF. She has had a cardiomyopathy known for about 15 years now. Cardiac cath at diagnosis showed mild nonobstructive disease.  EF has been persistently low.  Cause has been thought to be Adriamycin; she received this as part of her ovarian cancer treatment. She has had paroxysmal atrial fibrillation and has remained in NSR with amiodarone use.  No recent atrial fibrillation symptoms, typically feels prolonged palpitations thought to be atrial fibrillation 2-3 times/year.  She is in NSR today.   She has had dyspnea with moderate to heavy exertion for 4-5 years now.  However, prior to her initial appointment with me, her symptoms had worsened.  She developed shortness of breath walking short distances on flat ground.  Lasix was increased to 40 mg bid and she lost weight and felt better.     Cardiac MRI was done in 1/17, showing EF 29% with normal RV.  Septal-lateral dyssynchrony was present and there was a non-coronary pattern of LGE in the septum.  LFTs were noted to be mildly elevated.  Amiodarone was decreased to 100 mg daily.   She had Medtronic CRT-D placed in 3/17.   Since CRT-D was placed, she has been feeling better.  No dyspnea walking on flat ground or up a flight of stairs.  She has more energy.  No chest pain, no lightheadedness, no palpitations.  Weight is up 3 lbs.   Labs (1/17): K 4.2, creatinine 1.14, BNP 855 Labs (3/17): SPEP negative, BNP 341, K 4.5, creatinine 1.27, AST 62, ALT 74, TSH normal with low free T3 and normal free T4, HCT 46.5 Labs (4/17): AST 64, ALT 67, BNP 278, K 3.6, creatinine 1.21  PMH: 1. Chronic systolic CHF: Nonischemic cardiomyopathy, thought to be related to adriamycin  that she had for treatment of ovarian cancer.  NICM diagnosed ~ 2001.  - LHC ~ 2001 with 30% stenosis D1.   - Echo (2013) EF 25-30%.  - Echo (10/14) with EF 25-30%. - Echo (12/16) with EF 20-25%, mild LV dilation, restrictive diastolic function, moderate MR, severe LAE. - Cardiac MRI (1/17): Moderately dilated LV with EF 29%. Diffuse hypokinesis looking worse in the septal wall. Septal-lateral dyssynchrony. Normal RV size and systolic function.  Moderate MR with tethered posterior leaflet.  Mid-wall LGE in the basal to mid septum. This is not a coronary disease pattern. Could be suggestive of prior myocarditis. - Medtronic CRT-D placed 3/17.  2. Atrial fibrillation: Paroxysmal.  3. HTN 4. Ovarian cancer: 1985, s/p surgery. Received Adriamycin-based chemotherapy.  5. Colon cancer: 2007, s/p surgery.  6. PVCs 7. GERD 8. PFTs (8/16) without significant abnormality. 9. Prior syncope 10. Elevated transaminases: Uncertain etiology.   SH: Married, lives in [place], prior smoker.   FH: Father with MI.   ROS: All systems reviewed and negative except as per HPI.   Current Outpatient Prescriptions  Medication Sig Dispense Refill  . amiodarone (PACERONE) 200 MG tablet Take 0.5 tablets (100 mg total) by mouth daily. 90 tablet 3  . atorvastatin (LIPITOR) 40 MG tablet Take 1 tablet (40 mg total) by mouth daily. 90 tablet 3  . busPIRone (BUSPAR) 5 MG tablet Take 1 tablet (5 mg total) by mouth daily as needed (anxiety). 30 tablet 3  . Coenzyme  Q10 (COQ10) 100 MG CAPS Take 100 mg by mouth daily.     Marland Kitchen conjugated estrogens (PREMARIN) vaginal cream Place 1 Applicatorful vaginally as needed (dyspareunia). Reported on 05/11/2015    . diclofenac sodium (VOLTAREN) 1 % GEL APPLY 2 GRAMS TOPICALLY 4TIMES DAILY 100 g 3  . furosemide (LASIX) 40 MG tablet Take 1 tablet (40 mg total) by mouth 2 (two) times daily. 90 tablet 3  . ipratropium (ATROVENT) 0.03 % nasal spray Place 2 sprays into both nostrils 2 (two)  times daily.  11  . losartan (COZAAR) 25 MG tablet Take 1 tablet (25 mg total) by mouth 2 (two) times daily. 60 tablet 3  . magnesium oxide (MAG-OX) 400 MG tablet Take 800 mg by mouth 2 (two) times daily.    . metoprolol succinate (TOPROL-XL) 25 MG 24 hr tablet Take 1 tablet (25 mg total) by mouth 2 (two) times daily. 180 tablet 3  . potassium chloride SA (K-DUR,KLOR-CON) 20 MEQ tablet Take 20-40 mEq by mouth 2 (two) times daily. Reported on 09/01/2015    . spironolactone (ALDACTONE) 25 MG tablet Take 1 tablet (25 mg total) by mouth every evening. 90 tablet 3  . traMADol (ULTRAM) 50 MG tablet Take 50 mg by mouth every 6 (six) hours as needed for moderate pain. Reported on 07/03/2015    . tretinoin (RETIN-A) 0.1 % cream Apply 1 application topically every morning.     . warfarin (COUMADIN) 2.5 MG tablet TAKE AS DIRECTED BY COUMADIN CLINIC 90 tablet 1  . zolpidem (AMBIEN) 5 MG tablet Take 5 mg by mouth at bedtime as needed for sleep.     No current facility-administered medications for this encounter.   BP 112/74 mmHg  Pulse 62  Wt 131 lb 4 oz (59.535 kg)  SpO2 99% General: NAD Neck: No JVD, no thyromegaly or thyroid nodule.  Lungs: Clear to auscultation bilaterally with normal respiratory effort. CV: Nondisplaced PMI.  Heart regular S1/S2, no S3/S4, no murmur.  No edema.  No carotid bruit.  Normal pedal pulses.  Abdomen: Soft, nontender, no hepatosplenomegaly, no distention.  Skin: Intact without lesions or rashes.  Neurologic: Alert and oriented x 3.  Psych: Normal affect. Extremities: No clubbing or cyanosis.  HEENT: Normal.   Assessment/Plan: 1. Chronic systolic CHF: EF 0000000 with diffuse hypokinesis on 12/16 echo.  Nonischemic cardiomyopathy, probably related to Adriamycin with ovarian cancer treatment.  Cardiac MRI in 1/17 showed EF 29% with normal RV and septal-lateral dyssynchrony.  There was a non-cardiac pattern of LGE in the septum, cannot rule out prior myocarditis based on this  pattern.  She has a Medtronic CRT-D device.  NYHA class II symptoms currently.  She looks euvolemic on exam.    - Increase Toprol XL to 25 mg bid.   - Continue Lasix 40 bid and spironolactone 25 daily.  - Continue losartan 25 mg bid.  Can transition this to [facility] at next visit.    - Repeat echo 3 months after CRT-D to look for improvement.  2. Atrial fibrillation: Paroxysmal.  Maintaining NSR with amiodarone. Amiodarone decreased to 100 mg daily with elevated LFTs.  - Continue amiodarone at 100 mg daily. Check LFTs again today. Check TSH today.  She will need periodic eye exam as well.   - Continue warfarin.  3. Elevated LFTs: This may be due to CHF versus amiodarone.  Amiodarone decreased to 100 mg daily.  Abdominal US did not show liver abnormalities or gallstones.  - Check LFTs today.  If  still elevated, will get GI evaluation.  - Check HCV and HBV serologies.     [name] 09/01/2015

## 2015-09-02 ENCOUNTER — Ambulatory Visit (INDEPENDENT_AMBULATORY_CARE_PROVIDER_SITE_OTHER): Payer: Medicare Other | Admitting: *Deleted

## 2015-09-02 DIAGNOSIS — I4891 Unspecified atrial fibrillation: Secondary | ICD-10-CM

## 2015-09-02 LAB — HEPATITIS B SURFACE ANTIGEN: Hepatitis B Surface Ag: NEGATIVE

## 2015-09-02 LAB — HEPATITIS B SURFACE ANTIBODY,QUALITATIVE: Hep B S Ab: NONREACTIVE

## 2015-09-02 LAB — HEPATITIS C ANTIBODY: HCV Ab: 0.1 s/co ratio (ref 0.0–0.9)

## 2015-09-02 LAB — POCT INR: INR: 1.5

## 2015-09-04 ENCOUNTER — Other Ambulatory Visit (HOSPITAL_COMMUNITY): Payer: Self-pay | Admitting: Cardiology

## 2015-09-04 DIAGNOSIS — R945 Abnormal results of liver function studies: Secondary | ICD-10-CM

## 2015-09-04 DIAGNOSIS — R7989 Other specified abnormal findings of blood chemistry: Secondary | ICD-10-CM

## 2015-09-04 DIAGNOSIS — I509 Heart failure, unspecified: Secondary | ICD-10-CM

## 2015-09-04 MED ORDER — POTASSIUM CHLORIDE CRYS ER 20 MEQ PO TBCR: 20.0000 meq | EXTENDED_RELEASE_TABLET | Freq: Two times a day (BID) | ORAL | Status: DC

## 2015-09-04 MED ORDER — LOSARTAN POTASSIUM 25 MG PO TABS: 25.0000 mg | ORAL_TABLET | Freq: Two times a day (BID) | ORAL | Status: DC

## 2015-09-08 ENCOUNTER — Ambulatory Visit (INDEPENDENT_AMBULATORY_CARE_PROVIDER_SITE_OTHER): Payer: Medicare Other | Admitting: *Deleted

## 2015-09-08 DIAGNOSIS — I4891 Unspecified atrial fibrillation: Secondary | ICD-10-CM

## 2015-09-08 LAB — POCT INR: INR: 1.7

## 2015-09-23 ENCOUNTER — Encounter: Payer: Self-pay | Admitting: Internal Medicine

## 2015-09-25 ENCOUNTER — Ambulatory Visit (INDEPENDENT_AMBULATORY_CARE_PROVIDER_SITE_OTHER): Payer: Medicare Other

## 2015-09-25 DIAGNOSIS — I4891 Unspecified atrial fibrillation: Secondary | ICD-10-CM | POA: Diagnosis not present

## 2015-09-25 LAB — POCT INR: INR: 1.6

## 2015-09-28 ENCOUNTER — Telehealth (HOSPITAL_COMMUNITY): Payer: Self-pay | Admitting: Cardiology

## 2015-09-28 MED ORDER — POTASSIUM CHLORIDE CRYS ER 20 MEQ PO TBCR: 20.0000 meq | EXTENDED_RELEASE_TABLET | ORAL | Status: DC

## 2015-09-28 NOTE — Telephone Encounter (Signed)
Patient called to clarify potassium dose. Reviewed mst recent office visit and labs done 5/17, patient should be taking 60 meq daily New rx sent to pharmacy

## 2015-10-06 LAB — HM MAMMOGRAPHY

## 2015-10-09 ENCOUNTER — Ambulatory Visit (INDEPENDENT_AMBULATORY_CARE_PROVIDER_SITE_OTHER): Payer: Medicare Other | Admitting: *Deleted

## 2015-10-09 DIAGNOSIS — I4891 Unspecified atrial fibrillation: Secondary | ICD-10-CM | POA: Diagnosis not present

## 2015-10-09 LAB — POCT INR: INR: 2.6

## 2015-10-12 ENCOUNTER — Ambulatory Visit (INDEPENDENT_AMBULATORY_CARE_PROVIDER_SITE_OTHER): Payer: Medicare Other | Admitting: Internal Medicine

## 2015-10-12 ENCOUNTER — Encounter: Payer: Self-pay | Admitting: Internal Medicine

## 2015-10-12 VITALS — BP 102/80 | HR 60 | Ht 65.0 in | Wt 133.8 lb

## 2015-10-12 DIAGNOSIS — I428 Other cardiomyopathies: Secondary | ICD-10-CM

## 2015-10-12 DIAGNOSIS — I429 Cardiomyopathy, unspecified: Secondary | ICD-10-CM

## 2015-10-12 LAB — CUP PACEART INCLINIC DEVICE CHECK
Battery Remaining Longevity: 88 mo
Battery Voltage: 2.99 V
Brady Statistic AP VP Percent: 65.88 %
Brady Statistic AP VS Percent: 0.59 %
Brady Statistic AS VP Percent: 33.06 %
Brady Statistic AS VS Percent: 0.47 %
Brady Statistic RA Percent Paced: 66.47 %
Brady Statistic RV Percent Paced: 75.08 %
Date Time Interrogation Session: 20170626092721
HighPow Impedance: 64 Ohm
Implantable Lead Implant Date: 20170328
Implantable Lead Implant Date: 20170328
Implantable Lead Implant Date: 20170328
Implantable Lead Location: 753858
Implantable Lead Location: 753859
Implantable Lead Location: 753860
Implantable Lead Model: 4598
Implantable Lead Model: 5076
Implantable Lead Model: 6935
Lead Channel Impedance Value: 361 Ohm
Lead Channel Impedance Value: 361 Ohm
Lead Channel Impedance Value: 399 Ohm
Lead Channel Impedance Value: 456 Ohm
Lead Channel Impedance Value: 456 Ohm
Lead Channel Impedance Value: 532 Ohm
Lead Channel Impedance Value: 551 Ohm
Lead Channel Impedance Value: 589 Ohm
Lead Channel Impedance Value: 646 Ohm
Lead Channel Impedance Value: 703 Ohm
Lead Channel Impedance Value: 836 Ohm
Lead Channel Impedance Value: 874 Ohm
Lead Channel Impedance Value: 893 Ohm
Lead Channel Pacing Threshold Amplitude: 0.75 V
Lead Channel Pacing Threshold Amplitude: 1 V
Lead Channel Pacing Threshold Amplitude: 1 V
Lead Channel Pacing Threshold Pulse Width: 0.4 ms
Lead Channel Pacing Threshold Pulse Width: 0.4 ms
Lead Channel Pacing Threshold Pulse Width: 0.4 ms
Lead Channel Sensing Intrinsic Amplitude: 1.1 mV
Lead Channel Sensing Intrinsic Amplitude: 8.5 mV
Lead Channel Sensing Intrinsic Amplitude: 8.75 mV
Lead Channel Setting Pacing Amplitude: 2 V
Lead Channel Setting Pacing Amplitude: 2.5 V
Lead Channel Setting Pacing Amplitude: 2.5 V
Lead Channel Setting Pacing Pulse Width: 0.4 ms
Lead Channel Setting Pacing Pulse Width: 0.4 ms
Lead Channel Setting Sensing Sensitivity: 0.3 mV

## 2015-10-12 NOTE — Patient Instructions (Signed)
Medication Instructions:  Your physician recommends that you continue on your current medications as directed. Please refer to the Current Medication list given to you today.   Labwork: NONE  Testing/Procedures: NONE  Follow-Up: Your physician wants you to follow-up in: [place]   01-11-16   9 MONTHS  WITH DR  Knox Saliva will receive a reminder letter in the mail two months in advance. If you don't receive a letter, please call our office to schedule the follow-up appointment.   Any Other Special Instructions Will Be Listed Below (If Applicable).     If you need a refill on your cardiac medications before your next appointment, please call your pharmacy.

## 2015-10-12 NOTE — Progress Notes (Signed)
HPI [name] returns today for ongoing BiV ICD evaluation and management. She is a pleasant 80 yo woman with a h/o ovarian CA, s/p surgery and chemotherapy including adriamycin (1985), atrial fib well controlled with amiodarone, IVCD/LBBB with a QRS of 164, and dysynchrony on MRI and class 2 CHF. She has a h/o syncope, the last episode occuring approx 4 years ago. She remains active. She underwent BiV ICD insertion about 3 months ago. In the interim, she has done well. No chest pain or sob. No edema. No ICD shocks. Allergies  Allergen Reactions  . Clindamycin/Lincomycin     itching  . Pneumococcal Vaccines Itching and Swelling    Redness   . Sulfonamide Derivatives Itching and Swelling     Current Outpatient Prescriptions  Medication Sig Dispense Refill  . amiodarone (PACERONE) 200 MG tablet Take 0.5 tablets (100 mg total) by mouth daily. 90 tablet 3  . atorvastatin (LIPITOR) 40 MG tablet Take 1 tablet (40 mg total) by mouth daily. 90 tablet 3  . busPIRone (BUSPAR) 5 MG tablet Take 1 tablet (5 mg total) by mouth daily as needed (anxiety). 30 tablet 3  . Coenzyme Q10 (COQ10) 100 MG CAPS Take 100 mg by mouth daily.     Marland Kitchen conjugated estrogens (PREMARIN) vaginal cream Place 1 Applicatorful vaginally as needed (dyspareunia). Reported on 05/11/2015    . diclofenac sodium (VOLTAREN) 1 % GEL APPLY 2 GRAMS TOPICALLY 4TIMES DAILY 100 g 3  . furosemide (LASIX) 40 MG tablet Take 1 tablet (40 mg total) by mouth 2 (two) times daily. 90 tablet 3  . ipratropium (ATROVENT) 0.03 % nasal spray Place 2 sprays into both nostrils 2 (two) times daily.  11  . losartan (COZAAR) 25 MG tablet Take 1 tablet (25 mg total) by mouth 2 (two) times daily. 60 tablet 3  . magnesium oxide (MAG-OX) 400 MG tablet Take 800 mg by mouth 2 (two) times daily.    . metoprolol succinate (TOPROL-XL) 25 MG 24 hr tablet Take 1 tablet (25 mg total) by mouth 2 (two) times daily. 180 tablet 3  . potassium chloride SA  (K-DUR,KLOR-CON) 20 MEQ tablet Take 1-2 tablets (20-40 mEq total) by mouth as directed. Take 2 tabs in the AM and 1 tab in the PM 90 tablet 3  . spironolactone (ALDACTONE) 25 MG tablet Take 1 tablet (25 mg total) by mouth every evening. 90 tablet 3  . traMADol (ULTRAM) 50 MG tablet Take 50 mg by mouth every 6 (six) hours as needed for moderate pain. Reported on 07/03/2015    . tretinoin (RETIN-A) 0.1 % cream Apply 1 application topically every morning.     . warfarin (COUMADIN) 2.5 MG tablet TAKE AS DIRECTED BY COUMADIN CLINIC 90 tablet 1  . zolpidem (AMBIEN) 5 MG tablet Take 5 mg by mouth at bedtime as needed for sleep.     No current facility-administered medications for this visit.     Past Medical History  Diagnosis Date  . Hx: UTI (urinary tract infection)   . Nonischemic cardiomyopathy ([place])     last EF assessment 40% by MUGA  . Partial bowel obstruction ([place])   . Internal hemorrhoid   . Colon polyps 12/07/2010  . Hypertension   . Atrial fibrillation ([place])   . PVC (premature ventricular contraction)   . Coronary artery disease     nonobstructive with 30% D1  . Hyperlipidemia   . AICD (automatic cardioverter/defibrillator) present   . CHF (congestive heart failure) (  HCC)   . GERD (gastroesophageal reflux disease)   . Arthritis     "left ankle" (07/14/2015)  . History of ovarian cancer 1985  . Colon cancer ([place]) 03/2006    T3, N0    ROS:   All systems reviewed and negative except as noted in the HPI.   Past Surgical History  Procedure Laterality Date  . Oophorectomy  1961  . Tonsillectomy  1944  . Ovary surgery Right 1985    resection of right ovarian cancer  . Laparotomy  1980    "adhesions"  . Small intestine surgery  1985  . Colectomy  2007    for colon cancer  . Laparotomy  1989    laparotomy for takedown of intestinal obstruction secondary to adhesions   . Ankle closed reduction  10/16/2011    Procedure: CLOSED REDUCTION ANKLE;  Surgeon: Tobi Bastos,  MD;  Location: [facility];  Service: Orthopedics;  Laterality: Left;  . Colonoscopy N/A 03/11/2013    Procedure: COLONOSCOPY;  Surgeon: Ladene Artist, MD;  Location: [facility];  Service: Endoscopy;  Laterality: N/A;  . Esophagogastroduodenoscopy (egd) with propofol N/A 04/21/2015    Procedure: ESOPHAGOGASTRODUODENOSCOPY (EGD) WITH PROPOFOL;  Surgeon: Ladene Artist, MD;  Location: [facility];  Service: Endoscopy;  Laterality: N/A;  . Colonoscopy with propofol N/A 04/21/2015    Procedure: COLONOSCOPY WITH PROPOFOL;  Surgeon: Ladene Artist, MD;  Location: [facility];  Service: Endoscopy;  Laterality: N/A;  . Fracture surgery    . Abdominal hysterectomy  1979  . Insertion of icd Left 07/14/2015  . Ep implantable device N/A 07/14/2015    Procedure: BiV ICD Insertion CRT-D;  Surgeon: Evans Lance, MD;  Location: [facility];  Service: Cardiovascular;  Laterality: N/A;     Family History  Problem Relation Age of Onset  . Uterine cancer Mother   . Prostate cancer Father   . Heart disease Father   . Heart attack Father   . Colon cancer Neg Hx      Social History   Social History  . Marital Status: Married    Spouse Name: N/A  . Number of Children: 2  . Years of Education: N/A   Occupational History  . retired    Social History Main Topics  . Smoking status: Former Smoker -- 0.50 packs/day for 20 years    Types: Cigarettes    Quit date: 04/18/1978  . Smokeless tobacco: Never Used  . Alcohol Use: 8.4 oz/week    14 Glasses of wine per week     Comment: has wine before dinner   . Drug Use: No  . Sexual Activity: Not Currently   Other Topics Concern  . Not on file   Social History Narrative   Patient had never smoked.   Alcohol use- yes   Daily caffeine use- coffee   Illicit drug use- no   Occupation:retired      BP 102/80 mmHg  Pulse 60  Ht 5\' 5"  (1.651 m)  Wt 133 lb 12.8 oz (60.691 kg)  BMI 22.27 kg/m2  Physical Exam:  Well appearing 80 yo woman,  NAD HEENT: Unremarkable Neck:  6 cm JVD, no thyromegally Lymphatics:  No adenopathy Back:  No CVA tenderness Lungs:  Clear with no wheezes HEART:  Regular rate rhythm, no murmurs, no rubs, no clicks Abd:  soft, positive bowel sounds, no organomegally, no rebound, no guarding Ext:  2 plus pulses, no edema, no cyanosis, no clubbing Skin:  No  rashes no nodules Neuro:  CN II through XII intact, motor grossly intact  EKG - NSR with IVCD  Assess/Plan: 1. Atrial fibrillation - she is maintaining NSR on amiodarone 2. Chronic systolic heart failure - her symptoms are class 2. She will continue her current meds. 3. HTN - her blood pressure is well controlled. Will follow 4. ICD - her medtronic BiV ICD is working normally. Will recheck in several months.  Mikle Bosworth.D.

## 2015-10-14 ENCOUNTER — Encounter: Payer: Medicare Other | Admitting: Internal Medicine

## 2015-10-14 DIAGNOSIS — Z1231 Encounter for screening mammogram for malignant neoplasm of breast: Secondary | ICD-10-CM | POA: Diagnosis not present

## 2015-10-16 ENCOUNTER — Ambulatory Visit (HOSPITAL_COMMUNITY)
Admission: RE | Admit: 2015-10-16 | Discharge: 2015-10-16 | Disposition: A | Discharge status: Home / Self Care | Payer: Medicare Other | Source: Ambulatory Visit | Attending: Cardiology | Admitting: Cardiology

## 2015-10-16 ENCOUNTER — Telehealth (HOSPITAL_COMMUNITY): Payer: Self-pay | Admitting: Cardiology

## 2015-10-16 ENCOUNTER — Encounter (HOSPITAL_COMMUNITY): Payer: Self-pay

## 2015-10-16 VITALS — BP 95/51 | HR 61 | Ht 65.0 in | Wt 132.0 lb

## 2015-10-16 DIAGNOSIS — Z85038 Personal history of other malignant neoplasm of large intestine: Secondary | ICD-10-CM | POA: Insufficient documentation

## 2015-10-16 DIAGNOSIS — Z8543 Personal history of malignant neoplasm of ovary: Secondary | ICD-10-CM | POA: Diagnosis not present

## 2015-10-16 DIAGNOSIS — Z79899 Other long term (current) drug therapy: Secondary | ICD-10-CM | POA: Insufficient documentation

## 2015-10-16 DIAGNOSIS — R7989 Other specified abnormal findings of blood chemistry: Secondary | ICD-10-CM | POA: Insufficient documentation

## 2015-10-16 DIAGNOSIS — I110 Hypertensive heart disease with heart failure: Secondary | ICD-10-CM | POA: Insufficient documentation

## 2015-10-16 DIAGNOSIS — I480 Paroxysmal atrial fibrillation: Secondary | ICD-10-CM

## 2015-10-16 DIAGNOSIS — Z95810 Presence of automatic (implantable) cardiac defibrillator: Secondary | ICD-10-CM | POA: Diagnosis not present

## 2015-10-16 DIAGNOSIS — Z7901 Long term (current) use of anticoagulants: Secondary | ICD-10-CM | POA: Insufficient documentation

## 2015-10-16 DIAGNOSIS — Z8249 Family history of ischemic heart disease and other diseases of the circulatory system: Secondary | ICD-10-CM | POA: Diagnosis not present

## 2015-10-16 DIAGNOSIS — Z87891 Personal history of nicotine dependence: Secondary | ICD-10-CM | POA: Insufficient documentation

## 2015-10-16 DIAGNOSIS — I5022 Chronic systolic (congestive) heart failure: Secondary | ICD-10-CM | POA: Diagnosis not present

## 2015-10-16 DIAGNOSIS — Z9221 Personal history of antineoplastic chemotherapy: Secondary | ICD-10-CM | POA: Diagnosis not present

## 2015-10-16 DIAGNOSIS — I428 Other cardiomyopathies: Secondary | ICD-10-CM | POA: Insufficient documentation

## 2015-10-16 LAB — COMPREHENSIVE METABOLIC PANEL
ALT: 53 U/L (ref 14–54)
AST: 50 U/L — ABNORMAL HIGH (ref 15–41)
Albumin: 3.7 g/dL (ref 3.5–5.0)
Alkaline Phosphatase: 71 U/L (ref 38–126)
Anion gap: 11 (ref 5–15)
BUN: 31 mg/dL — ABNORMAL HIGH (ref 6–20)
CO2: 25 mmol/L (ref 22–32)
Calcium: 9.2 mg/dL (ref 8.9–10.3)
Chloride: 98 mmol/L — ABNORMAL LOW (ref 101–111)
Creatinine, Ser: 1.36 mg/dL — ABNORMAL HIGH (ref 0.44–1.00)
GFR calc Af Amer: 42 mL/min — ABNORMAL LOW (ref >60)
GFR calc non Af Amer: 36 mL/min — ABNORMAL LOW (ref >60)
Glucose, Bld: 100 mg/dL — ABNORMAL HIGH (ref 65–99)
Potassium: 4.4 mmol/L (ref 3.5–5.1)
Sodium: 134 mmol/L — ABNORMAL LOW (ref 135–145)
Total Bilirubin: 0.6 mg/dL (ref 0.3–1.2)
Total Protein: 7.1 g/dL (ref 6.5–8.1)

## 2015-10-16 LAB — MAGNESIUM: Magnesium: 1.6 mg/dL — ABNORMAL LOW (ref 1.7–2.4)

## 2015-10-16 LAB — T4, FREE: Free T4: 0.99 ng/dL (ref 0.61–1.12)

## 2015-10-16 MED ORDER — MAGNESIUM OXIDE 400 MG PO TABS: ORAL_TABLET | ORAL | Status: DC

## 2015-10-16 MED ORDER — LOSARTAN POTASSIUM 25 MG PO TABS: 25.0000 mg | ORAL_TABLET | Freq: Every day | ORAL | Status: DC

## 2015-10-16 NOTE — Patient Instructions (Signed)
DECREASE Losartan 25 mg, one tab daily at bedtime  Labs today  Your physician has requested that you have an echocardiogram. Echocardiography is a painless test that uses sound waves to create images of your heart. It provides your doctor with information about the size and shape of your heart and how well your heart's chambers and valves are working. This procedure takes approximately one hour. There are no restrictions for this procedure.  Your physician recommends that you schedule a follow-up appointment in: 2 months with Dr Aundra Dubin  Do the following things EVERYDAY: 1) Weigh yourself in the morning before breakfast. Write it down and keep it in a log. 2) Take your medicines as prescribed 3) Eat low salt foods-Limit salt (sodium) to 2000 mg per day.  4) Stay as active as you can everyday 5) Limit all fluids for the day to less than 2 liters 6)

## 2015-10-16 NOTE — Progress Notes (Signed)
Patient ID: Cynthia Frank, female   DOB: [birth year], 80 y.o.   MRN: [id number]    Advanced Heart Failure Clinic Note   PCP: Dr. Sharlet Salina HF Cardiology: Dr. Aundra Dubin  80 yo with history of chronic systolic CHF/nonischemic cardiomyopathy, paroxysmal atrial fibrillation, and prior ovarian and colon cancers presents for evaluation of CHF. She has had a cardiomyopathy known for about 15 years now. Cardiac cath at diagnosis showed mild nonobstructive disease.  EF has been persistently low.  Cause has been thought to be Adriamycin; she received this as part of her ovarian cancer treatment. She has had paroxysmal atrial fibrillation and has remained in NSR with amiodarone use.  No recent atrial fibrillation symptoms, typically feels prolonged palpitations thought to be atrial fibrillation 2-3 times/year.   She has had dyspnea with moderate to heavy exertion for 4-5 years now.  However, prior to her initial appointment here, her symptoms had worsened.  She developed shortness of breath walking short distances on flat ground.  Lasix was increased to 40 mg bid and she lost weight and felt better.     Cardiac MRI was done in 1/17, showing EF 29% with normal RV.  Septal-lateral dyssynchrony was present and there was a non-coronary pattern of LGE in the septum.  LFTs were noted to be mildly elevated.  Amiodarone was decreased to 100 mg daily.   She had Medtronic CRT-D placed in 3/17.   80 She presents today for regular follow up. At last visit Toprol XL increased 25 mg BID. Weight up 1 lb from last visit. Has felt OK overall. Has occasional lightheadedness throughout the day when going from sitting to standing and legs can feel weak when she's standing.  No falls. Breathing has been fine on flat ground.  Gets a little SOB up a flight of stairs. Has good and bad days with fatigue. Weight at home ~130. No CP or palpitations.   Labs (1/17): K 4.2, creatinine 1.14, BNP 855 Labs (3/17): SPEP negative, BNP 341, K 4.5,  creatinine 1.27, AST 62, ALT 74, TSH normal with low free T3 and normal free T4, HCT 46.5 Labs (4/17): AST 64, ALT 67, BNP 278, K 3.6, creatinine 1.21 Labs (5/17): K 3.4, creatinine 1.39, AST 53, ALT 61, HBV/HCV negative, TSH mildly elevated, BNP 306  Optivol: Fluid index < threshold, stable impedance.   PMH: 1. Chronic systolic CHF: Nonischemic cardiomyopathy, thought to be related to adriamycin that she had for treatment of ovarian cancer.  NICM diagnosed ~ 2001.  - LHC ~ 2001 with 30% stenosis D1.   - Echo (2013) EF 25-30%.  - Echo (10/14) with EF 25-30%. - Echo (12/16) with EF 20-25%, mild LV dilation, restrictive diastolic function, moderate MR, severe LAE. - Cardiac MRI (1/17): Moderately dilated LV with EF 29%. Diffuse hypokinesis looking worse in the septal wall. Septal-lateral dyssynchrony. Normal RV size and systolic function.  Moderate MR with tethered posterior leaflet.  Mid-wall LGE in the basal to mid septum. This is not a coronary disease pattern. Could be suggestive of prior myocarditis. - Medtronic CRT-D placed 3/17.  2. Atrial fibrillation: Paroxysmal.  3. HTN 4. Ovarian cancer: 1985, s/p surgery. Received Adriamycin-based chemotherapy.  5. Colon cancer: 2007, s/p surgery.  6. PVCs 7. GERD 8. PFTs (8/16) without significant abnormality. 9. Prior syncope 10. Elevated transaminases: Uncertain etiology.   SH: Married, lives in [place], prior smoker.   FH: Father with MI.   ROS: All systems reviewed and negative except as per HPI.   Current  Outpatient Prescriptions  Medication Sig Dispense Refill  . amiodarone (PACERONE) 200 MG tablet Take 0.5 tablets (100 mg total) by mouth daily. 90 tablet 3  . atorvastatin (LIPITOR) 40 MG tablet Take 1 tablet (40 mg total) by mouth daily. 90 tablet 3  . busPIRone (BUSPAR) 5 MG tablet Take 1 tablet (5 mg total) by mouth daily as needed (anxiety). 30 tablet 3  . Coenzyme Q10 (COQ10) 100 MG CAPS Take 100 mg by mouth daily.     Marland Kitchen  conjugated estrogens (PREMARIN) vaginal cream Place 1 Applicatorful vaginally as needed (dyspareunia). Reported on 05/11/2015    . diclofenac sodium (VOLTAREN) 1 % GEL APPLY 2 GRAMS TOPICALLY 4TIMES DAILY 100 g 3  . furosemide (LASIX) 40 MG tablet Take 1 tablet (40 mg total) by mouth 2 (two) times daily. 90 tablet 3  . ipratropium (ATROVENT) 0.03 % nasal spray Place 2 sprays into both nostrils 2 (two) times daily.  11  . losartan (COZAAR) 25 MG tablet Take 1 tablet (25 mg total) by mouth 2 (two) times daily. 60 tablet 3  . magnesium oxide (MAG-OX) 400 MG tablet Take 800 mg by mouth 2 (two) times daily.    . metoprolol succinate (TOPROL-XL) 25 MG 24 hr tablet Take 1 tablet (25 mg total) by mouth 2 (two) times daily. 180 tablet 3  . potassium chloride SA (K-DUR,KLOR-CON) 20 MEQ tablet Take 1-2 tablets (20-40 mEq total) by mouth as directed. Take 2 tabs in the AM and 1 tab in the PM 90 tablet 3  . spironolactone (ALDACTONE) 25 MG tablet Take 1 tablet (25 mg total) by mouth every evening. 90 tablet 3  . tretinoin (RETIN-A) 0.1 % cream Apply 1 application topically every morning.     . warfarin (COUMADIN) 2.5 MG tablet TAKE AS DIRECTED BY COUMADIN CLINIC 90 tablet 1  . zolpidem (AMBIEN) 5 MG tablet Take 5 mg by mouth at bedtime as needed for sleep.    . traMADol (ULTRAM) 50 MG tablet Take 50 mg by mouth every 6 (six) hours as needed for moderate pain. Reported on 10/16/2015     No current facility-administered medications for this encounter.   BP 95/51 mmHg  Pulse 61  Ht 5\' 5"  (1.651 m)  Wt 132 lb (59.875 kg)  BMI 21.97 kg/m2  SpO2 99%   Wt Readings from Last 3 Encounters:  10/16/15 132 lb (59.875 kg)  10/12/15 133 lb 12.8 oz (60.691 kg)  09/01/15 131 lb 4 oz (59.535 kg)    General: NAD Neck: No JVD appreciated, no thyromegaly or thyroid nodule.  Lungs: CTAB, normal effort CV: Nondisplaced PMI.  Heart regular S1/S2, no S3/S4, no murmur.  No peripheral edema.  No carotid bruit.  Normal pedal  pulses.  Abdomen: Soft, NT, ND, no HSM. No bruits or masses. +BS   Skin: Intact without lesions or rashes.  Neurologic: Alert and oriented x 3.  Psych: Normal affect. Extremities: No clubbing or cyanosis.  HEENT: Normal.   Assessment/Plan: 1. Chronic systolic CHF: EF 0000000 with diffuse hypokinesis on 12/16 echo.  Nonischemic cardiomyopathy, probably related to Adriamycin with ovarian cancer treatment.  Cardiac MRI in 1/17 showed EF 29% with normal RV and septal-lateral dyssynchrony.  There was a non-cardiac pattern of LGE in the septum, cannot rule out prior myocarditis based on this pattern.  She has a Medtronic CRT-D device.  NYHA class II symptoms currently.  She looks euvolemic on exam.    - Continue Toprol XL 25 mg bid.   -  Continue Lasix 40 bid and spironolactone 25 daily. CMET and Mg today.  - Decrease losartan to 25 mg qhs given orthostatic-type symptoms.  No room to transition this to [facility] at this visit.    - Repeat echo 2 months after CRT-D to look for improvement.  2. Atrial fibrillation: Paroxysmal.  Maintaining NSR with amiodarone.  - Continue amiodarone at 100 mg daily. Check LFTs again today. Check TSH, Free T3 and Free T4 today.  She will need periodic eye exam as well.  - Continue warfarin.  3. Elevated LFTs: This may be due to CHF versus amiodarone.  Amiodarone decreased to 100 mg daily.  Abdominal US did not show liver abnormalities or gallstones.  HCV/HBV serologies negative.  - Check LFTs today.  She has GI follow up scheduled for later in July. Would like to try and keep on amiodarone if possible.    Meds and labs as above.  2 month follow up with Echo.   Cynthia Friar, PA-C 10/16/2015   Patient seen with PA, agree with the above note.  Overall doing well, but has some orthostatic symptoms.   - As above, decrease losartan to 25 mg qhs.   - Mild elevation in transaminases.  Has a GI appt.  Check LFTs today.   Followup in 2 months with echo.   [name] 10/17/2015

## 2015-10-16 NOTE — Telephone Encounter (Signed)
-----   Message from Larey Dresser, MD sent at 10/16/2015 11:51 AM EDT ----- LFTs better now!  Mg low, add magnesium oxide 400 mg daily.

## 2015-10-16 NOTE — Telephone Encounter (Signed)
Patient reports she is currently taking 800 mg BID. Per vo Dr Aundra Dubin she should add an additional 400 mg daily Patient reports understanding

## 2015-10-17 LAB — T3, FREE: T3, Free: 1.9 pg/mL — ABNORMAL LOW (ref 2.0–4.4)

## 2015-10-26 ENCOUNTER — Telehealth (HOSPITAL_COMMUNITY): Payer: Self-pay

## 2015-10-26 MED ORDER — FUROSEMIDE 40 MG PO TABS: 40.0000 mg | ORAL_TABLET | Freq: Two times a day (BID) | ORAL | Status: DC

## 2015-10-26 NOTE — Telephone Encounter (Signed)
Patient called CHF triage line to request correct Rx for lasix be sent into pharmacy (current Rx says once daily, is ordered per our office to take BID). Will update correct Rx to preferred pharmacy electronically.  Renee Pain

## 2015-10-30 ENCOUNTER — Ambulatory Visit (INDEPENDENT_AMBULATORY_CARE_PROVIDER_SITE_OTHER): Payer: Medicare Other | Admitting: *Deleted

## 2015-10-30 DIAGNOSIS — I4891 Unspecified atrial fibrillation: Secondary | ICD-10-CM | POA: Diagnosis not present

## 2015-10-30 LAB — POCT INR: INR: 3.5

## 2015-11-03 ENCOUNTER — Ambulatory Visit (INDEPENDENT_AMBULATORY_CARE_PROVIDER_SITE_OTHER): Payer: Medicare Other | Admitting: Gastroenterology

## 2015-11-03 ENCOUNTER — Encounter: Payer: Self-pay | Admitting: Internal Medicine

## 2015-11-03 ENCOUNTER — Other Ambulatory Visit (INDEPENDENT_AMBULATORY_CARE_PROVIDER_SITE_OTHER): Payer: Medicare Other

## 2015-11-03 ENCOUNTER — Encounter: Payer: Self-pay | Admitting: Gastroenterology

## 2015-11-03 VITALS — BP 100/60 | HR 68 | Ht 65.0 in | Wt 134.2 lb

## 2015-11-03 DIAGNOSIS — R945 Abnormal results of liver function studies: Secondary | ICD-10-CM

## 2015-11-03 DIAGNOSIS — K219 Gastro-esophageal reflux disease without esophagitis: Secondary | ICD-10-CM

## 2015-11-03 DIAGNOSIS — R7989 Other specified abnormal findings of blood chemistry: Secondary | ICD-10-CM

## 2015-11-03 DIAGNOSIS — Z85038 Personal history of other malignant neoplasm of large intestine: Secondary | ICD-10-CM

## 2015-11-03 LAB — IBC PANEL
Iron: 104 ug/dL (ref 42–145)
Saturation Ratios: 24.8 % (ref 20.0–50.0)
Transferrin: 299 mg/dL (ref 212.0–360.0)

## 2015-11-03 LAB — FERRITIN: Ferritin: 231.2 ng/mL (ref 10.0–291.0)

## 2015-11-03 NOTE — Patient Instructions (Addendum)
Your physician has requested that you go to the basement for lab work before leaving today.  Increase water intake prior to CT scan.   You have been scheduled for a CT scan of the abdomen and pelvis at [facility] ([address]---this is in the same building as Press [facility]).   You are scheduled on 11/05/15  at 3:00pm. You should arrive 15 minutes prior to your appointment time for registration. Please follow the written instructions below on the day of your exam:  WARNING: IF YOU ARE ALLERGIC TO IODINE/X-RAY DYE, PLEASE NOTIFY RADIOLOGY IMMEDIATELY AT [phone]! YOU WILL BE GIVEN A 13 HOUR PREMEDICATION PREP.  1) Do not eat or drink anything after 11:00am (4 hours prior to your test) 2) You have been given 2 bottles of oral contrast to drink. The solution may taste better if refrigerated, but do NOT add ice or any other liquid to this solution. Shake well before drinking.    Drink 1 bottle of contrast @ 1:00pm (2 hours prior to your exam)  Drink 1 bottle of contrast @ 2:00pm (1 hour prior to your exam)  You may take any medications as prescribed with a small amount of water except for the following: Metformin, Glucophage, Glucovance, Avandamet, Riomet, Fortamet, Actoplus Met, Janumet, Glumetza or Metaglip. The above medications must be held the day of the exam AND 48 hours after the exam.  The purpose of you drinking the oral contrast is to aid in the visualization of your intestinal tract. The contrast solution may cause some diarrhea. Before your exam is started, you will be given a small amount of fluid to drink. Depending on your individual set of symptoms, you may also receive an intravenous injection of x-ray contrast/dye. Plan on being at [facility] for 30 minutes or longer, depending on the type of exam you are having performed.  This test typically takes 30-45 minutes to complete.  If you have any questions regarding your exam or if you need to  reschedule, you may call the CT department at [phone] between the hours of 8:00 am and 5:00 pm, Monday-Friday.  ________________________________________________________________________  Thank you for choosing me and [facility].  Pricilla Riffle. Dagoberto Ligas., MD., [facility] Regal

## 2015-11-03 NOTE — Progress Notes (Signed)
History of Present Illness: This is a 80 year-old female with abnormal transaminases. She has had persistent mild abnormalities of transaminases since 2012. Most recently they improved on 6/30: AST=50, ALT=53 previously on 4/3: AST=64, ALT=67. Amiodarone dose was reduced from 200 mg to 100 mg daily a few months ago. She has CHF felt secondary to Adraimycin. Hep A, B, C serologies were negative in 08/2015. No history of liver disease, hepatitis or jaundice. No family history of liver disease.   Abd [place] 06/22/15 IMPRESSION: 1. Negative sonographic appearance of the liver, gallbladder, and biliary tree. 2. No acute findings. Chronic renal scarring greater on the left.  Allergies  Allergen Reactions  . Clindamycin/Lincomycin     itching  . Pneumococcal Vaccines Itching and Swelling    Redness   . Sulfonamide Derivatives Itching and Swelling   Outpatient Prescriptions Prior to Visit  Medication Sig Dispense Refill  . amiodarone (PACERONE) 200 MG tablet Take 0.5 tablets (100 mg total) by mouth daily. 90 tablet 3  . atorvastatin (LIPITOR) 40 MG tablet Take 1 tablet (40 mg total) by mouth daily. 90 tablet 3  . busPIRone (BUSPAR) 5 MG tablet Take 1 tablet (5 mg total) by mouth daily as needed (anxiety). 30 tablet 3  . Coenzyme Q10 (COQ10) 100 MG CAPS Take 100 mg by mouth daily.     Marland Kitchen conjugated estrogens (PREMARIN) vaginal cream Place 1 Applicatorful vaginally as needed (dyspareunia). Reported on 05/11/2015    . diclofenac sodium (VOLTAREN) 1 % GEL APPLY 2 GRAMS TOPICALLY 4TIMES DAILY 100 g 3  . furosemide (LASIX) 40 MG tablet Take 1 tablet (40 mg total) by mouth 2 (two) times daily. 90 tablet 3  . ipratropium (ATROVENT) 0.03 % nasal spray Place 2 sprays into both nostrils 2 (two) times daily.  11  . losartan (COZAAR) 25 MG tablet Take 1 tablet (25 mg total) by mouth daily. 30 tablet 3  . magnesium oxide (MAG-OX) 400 MG tablet Take 5 tabs daily ( 3 tabs in the AM and 2 tabs in the PM) 150 tablet 2   . metoprolol succinate (TOPROL-XL) 25 MG 24 hr tablet Take 1 tablet (25 mg total) by mouth 2 (two) times daily. 180 tablet 3  . potassium chloride SA (K-DUR,KLOR-CON) 20 MEQ tablet Take 1-2 tablets (20-40 mEq total) by mouth as directed. Take 2 tabs in the AM and 1 tab in the PM 90 tablet 3  . spironolactone (ALDACTONE) 25 MG tablet Take 1 tablet (25 mg total) by mouth every evening. 90 tablet 3  . traMADol (ULTRAM) 50 MG tablet Take 50 mg by mouth every 6 (six) hours as needed for moderate pain. Reported on 10/16/2015    . tretinoin (RETIN-A) 0.1 % cream Apply 1 application topically every morning.     . warfarin (COUMADIN) 2.5 MG tablet TAKE AS DIRECTED BY COUMADIN CLINIC 90 tablet 1  . zolpidem (AMBIEN) 5 MG tablet Take 5 mg by mouth at bedtime as needed for sleep.     No facility-administered medications prior to visit.   Past Medical History  Diagnosis Date  . Hx: UTI (urinary tract infection)   . Nonischemic cardiomyopathy ([place])     last EF assessment 40% by [facility]GA  . Partial bowel obstruction ([place])   . Internal hemorrhoid   . Colon polyps 12/07/2010  . Hypertension   . Atrial fibrillation ([place])   . PVC (premature ventricular contraction)   . Coronary artery disease     nonobstructive with 30% D1  .  Hyperlipidemia   . AICD (automatic cardioverter/defibrillator) present   . CHF (congestive heart failure) ([place])   . GERD (gastroesophageal reflux disease)   . Arthritis     "left ankle" (07/14/2015)  . History of ovarian cancer 1985  . Colon cancer ([place]) 03/2006    T3, N0   Past Surgical History  Procedure Laterality Date  . Oophorectomy  1961  . Tonsillectomy  1944  . Ovary surgery Right 1985    resection of right ovarian cancer  . Laparotomy  1980    "adhesions"  . Small intestine surgery  1985  . Colectomy  2007    for colon cancer  . Laparotomy  1989    laparotomy for takedown of intestinal obstruction secondary to adhesions   . Ankle closed reduction  10/16/2011     Procedure: CLOSED REDUCTION ANKLE;  Surgeon: Tobi Bastos, MD;  Location: [facility];  Service: Orthopedics;  Laterality: Left;  . Colonoscopy N/A 03/11/2013    Procedure: COLONOSCOPY;  Surgeon: Ladene Artist, MD;  Location: [facility];  Service: Endoscopy;  Laterality: N/A;  . Esophagogastroduodenoscopy (egd) with propofol N/A 04/21/2015    Procedure: ESOPHAGOGASTRODUODENOSCOPY (EGD) WITH PROPOFOL;  Surgeon: Ladene Artist, MD;  Location: [facility];  Service: Endoscopy;  Laterality: N/A;  . Colonoscopy with propofol N/A 04/21/2015    Procedure: COLONOSCOPY WITH PROPOFOL;  Surgeon: Ladene Artist, MD;  Location: [facility];  Service: Endoscopy;  Laterality: N/A;  . Fracture surgery    . Abdominal hysterectomy  1979  . Insertion of icd Left 07/14/2015  . Ep implantable device N/A 07/14/2015    Procedure: BiV ICD Insertion CRT-D;  Surgeon: Evans Lance, MD;  Location: [place] [facility] [facility];  Service: Cardiovascular;  Laterality: N/A;   Social History   Social History  . Marital Status: Married    Spouse Name: N/A  . Number of Children: 2  . Years of Education: N/A   Occupational History  . retired    Social History Main Topics  . Smoking status: Former Smoker -- 0.50 packs/day for 20 years    Types: Cigarettes    Quit date: 04/18/1978  . Smokeless tobacco: Never Used  . Alcohol Use: 8.4 oz/week    14 Glasses of wine per week     Comment: has wine before dinner   . Drug Use: No  . Sexual Activity: Not Currently   Other Topics Concern  . None   Social History Narrative   Patient had never smoked.   Alcohol use- yes   Daily caffeine use- coffee   Illicit drug use- no   Occupation:retired    Family History  Problem Relation Age of Onset  . Uterine cancer Mother   . Prostate cancer Father   . Heart disease Father   . Heart attack Father   . Colon cancer Neg Hx     Physical Exam: General: Well developed, well nourished, no acute distress Head: Normocephalic and  atraumatic Eyes:  sclerae anicteric, EOMI Ears: Normal auditory acuity Mouth: No deformity or lesions Lungs: Clear throughout to auscultation Heart: Regular rate and rhythm; no murmurs, rubs or bruits Abdomen: Soft, non tender and non distended. No masses, hepatosplenomegaly or hernias noted. Normal Bowel sounds Musculoskeletal: Symmetrical with no gross deformities  Pulses:  Normal pulses noted Extremities: No clubbing, cyanosis, edema or deformities noted Neurological: Alert oriented x 4, grossly nonfocal Psychological:  Alert and cooperative. Normal mood and affect  Assessment and Recommendations:  1. Mildly abnormal transaminases.  Amiodarone side effect is very likely, especially with slight improvement in LFTs after dose reduction. Passive congestion related to congestive heart failure is also likely. Rule out metabolic and genetic liver diseases. Obtain all standard hepatic serologies. Schedule CT scan of the abdomen/pelvis, especially given history of colon and ovarian cancer, to exclude an infiltrative process. Monitor LFTs q3 months for now.   2. GERD. Symptoms controlled on antireflux regimen regimen. PPI or H2RA daily as needed.  3. Personal history of T3, N0 colon cancer. 2 year interval surveillance colonoscopy recommended in January 2019.  4. CHF/non ischemic CM  5. S/P ICD  6. Afib maintained on amiodarone and Coumadin anticoagulation.

## 2015-11-04 LAB — ANGIOTENSIN CONVERTING ENZYME: Angiotensin-Converting Enzyme: 63 U/L — ABNORMAL HIGH (ref 8–52)

## 2015-11-04 LAB — ANA: Anti Nuclear Antibody(ANA): NEGATIVE

## 2015-11-05 ENCOUNTER — Ambulatory Visit (INDEPENDENT_AMBULATORY_CARE_PROVIDER_SITE_OTHER)
Admission: RE | Admit: 2015-11-05 | Discharge: 2015-11-05 | Disposition: A | Discharge status: Home / Self Care | Payer: Medicare Other | Source: Ambulatory Visit | Attending: Gastroenterology | Admitting: Gastroenterology

## 2015-11-05 DIAGNOSIS — R7989 Other specified abnormal findings of blood chemistry: Secondary | ICD-10-CM

## 2015-11-05 DIAGNOSIS — R945 Abnormal results of liver function studies: Secondary | ICD-10-CM

## 2015-11-05 DIAGNOSIS — R935 Abnormal findings on diagnostic imaging of other abdominal regions, including retroperitoneum: Secondary | ICD-10-CM | POA: Diagnosis not present

## 2015-11-05 DIAGNOSIS — Z85038 Personal history of other malignant neoplasm of large intestine: Secondary | ICD-10-CM | POA: Diagnosis not present

## 2015-11-05 LAB — MITOCHONDRIAL ANTIBODIES: Mitochondrial M2 Ab, IgG: <=20 Units (ref <=20.0)

## 2015-11-05 LAB — ALPHA-1-ANTITRYPSIN: A-1 Antitrypsin, Ser: 162 mg/dL (ref 83–199)

## 2015-11-05 LAB — CERULOPLASMIN: Ceruloplasmin: 31 mg/dL (ref 18–53)

## 2015-11-05 LAB — ANTI-SMOOTH MUSCLE ANTIBODY, IGG: Smooth Muscle Ab: 37 U — ABNORMAL HIGH (ref <20)

## 2015-11-05 MED ORDER — IOPAMIDOL (ISOVUE-300) INJECTION 61%
80.0000 mL | Freq: Once | INTRAVENOUS | Status: AC | PRN
  Administered 2015-11-05: 80 mL via INTRAVENOUS

## 2015-11-06 ENCOUNTER — Encounter: Payer: Self-pay | Admitting: Geriatric Medicine

## 2015-11-13 ENCOUNTER — Ambulatory Visit (INDEPENDENT_AMBULATORY_CARE_PROVIDER_SITE_OTHER): Payer: Medicare Other | Admitting: *Deleted

## 2015-11-13 DIAGNOSIS — I4891 Unspecified atrial fibrillation: Secondary | ICD-10-CM | POA: Diagnosis not present

## 2015-11-13 LAB — POCT INR: INR: 2.4

## 2015-11-17 ENCOUNTER — Encounter: Payer: Self-pay | Admitting: Internal Medicine

## 2015-11-27 DIAGNOSIS — R1310 Dysphagia, unspecified: Secondary | ICD-10-CM | POA: Diagnosis not present

## 2015-11-27 DIAGNOSIS — Z79899 Other long term (current) drug therapy: Secondary | ICD-10-CM | POA: Diagnosis not present

## 2015-11-27 DIAGNOSIS — J385 Laryngeal spasm: Secondary | ICD-10-CM | POA: Diagnosis not present

## 2015-12-14 ENCOUNTER — Ambulatory Visit (INDEPENDENT_AMBULATORY_CARE_PROVIDER_SITE_OTHER): Payer: Medicare Other | Admitting: *Deleted

## 2015-12-14 DIAGNOSIS — I4891 Unspecified atrial fibrillation: Secondary | ICD-10-CM | POA: Diagnosis not present

## 2015-12-14 LAB — POCT INR: INR: 2.7

## 2015-12-16 ENCOUNTER — Ambulatory Visit (HOSPITAL_BASED_OUTPATIENT_CLINIC_OR_DEPARTMENT_OTHER)
Admission: RE | Admit: 2015-12-16 | Discharge: 2015-12-16 | Disposition: A | Discharge status: Home / Self Care | Payer: Medicare Other | Source: Ambulatory Visit | Attending: Cardiology | Admitting: Cardiology

## 2015-12-16 ENCOUNTER — Ambulatory Visit (HOSPITAL_COMMUNITY)
Admission: RE | Admit: 2015-12-16 | Discharge: 2015-12-16 | Disposition: A | Discharge status: Home / Self Care | Payer: Medicare Other | Source: Ambulatory Visit | Attending: Internal Medicine | Admitting: Internal Medicine

## 2015-12-16 ENCOUNTER — Encounter (HOSPITAL_COMMUNITY): Payer: Self-pay

## 2015-12-16 VITALS — BP 122/74 | HR 60 | Wt 135.5 lb

## 2015-12-16 DIAGNOSIS — I428 Other cardiomyopathies: Secondary | ICD-10-CM | POA: Diagnosis not present

## 2015-12-16 DIAGNOSIS — I2510 Atherosclerotic heart disease of native coronary artery without angina pectoris: Secondary | ICD-10-CM | POA: Diagnosis not present

## 2015-12-16 DIAGNOSIS — Z87891 Personal history of nicotine dependence: Secondary | ICD-10-CM | POA: Insufficient documentation

## 2015-12-16 DIAGNOSIS — I5022 Chronic systolic (congestive) heart failure: Secondary | ICD-10-CM

## 2015-12-16 DIAGNOSIS — I110 Hypertensive heart disease with heart failure: Secondary | ICD-10-CM | POA: Diagnosis not present

## 2015-12-16 DIAGNOSIS — I340 Nonrheumatic mitral (valve) insufficiency: Secondary | ICD-10-CM | POA: Insufficient documentation

## 2015-12-16 DIAGNOSIS — E785 Hyperlipidemia, unspecified: Secondary | ICD-10-CM | POA: Insufficient documentation

## 2015-12-16 DIAGNOSIS — R7989 Other specified abnormal findings of blood chemistry: Secondary | ICD-10-CM

## 2015-12-16 DIAGNOSIS — I351 Nonrheumatic aortic (valve) insufficiency: Secondary | ICD-10-CM | POA: Diagnosis not present

## 2015-12-16 DIAGNOSIS — R945 Abnormal results of liver function studies: Secondary | ICD-10-CM

## 2015-12-16 DIAGNOSIS — I493 Ventricular premature depolarization: Secondary | ICD-10-CM | POA: Insufficient documentation

## 2015-12-16 DIAGNOSIS — I509 Heart failure, unspecified: Secondary | ICD-10-CM | POA: Diagnosis present

## 2015-12-16 DIAGNOSIS — I480 Paroxysmal atrial fibrillation: Secondary | ICD-10-CM | POA: Diagnosis not present

## 2015-12-16 LAB — COMPREHENSIVE METABOLIC PANEL
ALT: 37 U/L (ref 14–54)
AST: 45 U/L — ABNORMAL HIGH (ref 15–41)
Albumin: 4 g/dL (ref 3.5–5.0)
Alkaline Phosphatase: 63 U/L (ref 38–126)
Anion gap: 10 (ref 5–15)
BUN: 23 mg/dL — ABNORMAL HIGH (ref 6–20)
CO2: 26 mmol/L (ref 22–32)
Calcium: 9 mg/dL (ref 8.9–10.3)
Chloride: 99 mmol/L — ABNORMAL LOW (ref 101–111)
Creatinine, Ser: 1.19 mg/dL — ABNORMAL HIGH (ref 0.44–1.00)
GFR calc Af Amer: 50 mL/min — ABNORMAL LOW (ref >60)
GFR calc non Af Amer: 43 mL/min — ABNORMAL LOW (ref >60)
Glucose, Bld: 97 mg/dL (ref 65–99)
Potassium: 3.8 mmol/L (ref 3.5–5.1)
Sodium: 135 mmol/L (ref 135–145)
Total Bilirubin: 0.6 mg/dL (ref 0.3–1.2)
Total Protein: 7.1 g/dL (ref 6.5–8.1)

## 2015-12-16 LAB — TSH: TSH: 4.43 u[IU]/mL (ref 0.350–4.500)

## 2015-12-16 MED ORDER — METOPROLOL SUCCINATE ER 25 MG PO TB24: 37.5000 mg | ORAL_TABLET | Freq: Two times a day (BID) | ORAL | 3 refills | Status: DC

## 2015-12-16 NOTE — Progress Notes (Signed)
Patient ID: Cynthia Frank, female   DOB: 1935/09/26, 80 y.o.   MRN: [id number]    Advanced Heart Failure Clinic Note   PCP: Dr. Sharlet Salina HF Cardiology: Dr. Aundra Dubin  80 yo with history of chronic systolic CHF/nonischemic cardiomyopathy, paroxysmal atrial fibrillation, and prior ovarian and colon cancers presents for evaluation of CHF. She has had a cardiomyopathy known for about 15 years now. Cardiac cath at diagnosis showed mild nonobstructive disease.  EF has been persistently low.  Cause has been thought to be Adriamycin; she received this as part of her ovarian cancer treatment. She has had paroxysmal atrial fibrillation and has remained in NSR with amiodarone use.  No recent atrial fibrillation symptoms, typically feels prolonged palpitations thought to be atrial fibrillation 2-3 times/year.   She has had dyspnea with moderate to heavy exertion for 4-5 years now.  However, prior to her initial appointment here, her symptoms had worsened.  She developed shortness of breath walking short distances on flat ground.  Lasix was increased to 40 mg bid and she lost weight and felt better.     Cardiac MRI was done in 1/17, showing EF 29% with normal RV.  Septal-lateral dyssynchrony was present and there was a non-coronary pattern of LGE in the septum.  LFTs were noted to be mildly elevated.  Amiodarone was decreased to 100 mg daily.   She had Medtronic CRT-D placed in 3/17. Repeat echo today shows EF 35% with diffuse hypokinesis, normal RV.   She presents today for regular follow up. She is not have lightheaded spells after decreasing losartan.  No significant exertional dyspnea.  Spending a lot of time at the beach.  No chest pain.  No orthopnea/PND.  No palpitations.  Weight is up about 3 lbs.    Labs (1/17): K 4.2, creatinine 1.14, BNP 855 Labs (3/17): SPEP negative, BNP 341, K 4.5, creatinine 1.27, AST 62, ALT 74, TSH normal with low free T3 and normal free T4, HCT 46.5 Labs (4/17): AST 64, ALT  67, BNP 278, K 3.6, creatinine 1.21 Labs (5/17): K 3.4, creatinine 1.39, AST 53, ALT 61, HBV/HCV negative, TSH mildly elevated, BNP 306 Labs (6/17): K 4.4, creatinine 1.36, free T4 normal, free T3 mildly decreased, AST 50, ALT 53. Labs (7/17): ANA negative, ASMA positive  Optivol: Fluid index < threshold, stable impedance.  No AF/VT.   PMH: 1. Chronic systolic CHF: Nonischemic cardiomyopathy, thought to be related to adriamycin that she had for treatment of ovarian cancer.  NICM diagnosed ~ 2001.  - LHC ~ 2001 with 30% stenosis D1.   - Echo (2013) EF 25-30%.  - Echo (10/14) with EF 25-30%. - Echo (12/16) with EF 20-25%, mild LV dilation, restrictive diastolic function, moderate MR, severe LAE. - Cardiac MRI (1/17): Moderately dilated LV with EF 29%. Diffuse hypokinesis looking worse in the septal wall. Septal-lateral dyssynchrony. Normal RV size and systolic function.  Moderate MR with tethered posterior leaflet.  Mid-wall LGE in the basal to mid septum. This is not a coronary disease pattern. Could be suggestive of prior myocarditis. - Medtronic CRT-D placed 3/17.  - Echo (8/17): EF 35%, diffuse hypokinesis, normal RV size and systolic function, moderate MR.  2. Atrial fibrillation: Paroxysmal.  3. HTN 4. Ovarian cancer: 1985, s/p surgery. Received Adriamycin-based chemotherapy.  5. Colon cancer: 2007, s/p surgery.  6. PVCs 7. GERD 8. PFTs (8/16) without significant abnormality. 9. Prior syncope 10. Elevated transaminases: Uncertain etiology.   SH: Married, lives in [place], prior smoker.  FH: Father with MI.   ROS: All systems reviewed and negative except as per HPI.   Current Outpatient Prescriptions  Medication Sig Dispense Refill  . amiodarone (PACERONE) 200 MG tablet Take 0.5 tablets (100 mg total) by mouth daily. 90 tablet 3  . atorvastatin (LIPITOR) 40 MG tablet Take 1 tablet (40 mg total) by mouth daily. 90 tablet 3  . busPIRone (BUSPAR) 5 MG tablet Take 1 tablet (5  mg total) by mouth daily as needed (anxiety). 30 tablet 3  . Coenzyme Q10 (COQ10) 100 MG CAPS Take 100 mg by mouth daily.     Marland Kitchen conjugated estrogens (PREMARIN) vaginal cream Place 1 Applicatorful vaginally as needed (dyspareunia). Reported on 05/11/2015    . diclofenac sodium (VOLTAREN) 1 % GEL APPLY 2 GRAMS TOPICALLY 4TIMES DAILY 100 g 3  . furosemide (LASIX) 40 MG tablet Take 1 tablet (40 mg total) by mouth 2 (two) times daily. 90 tablet 3  . ipratropium (ATROVENT) 0.03 % nasal spray Place 2 sprays into both nostrils 2 (two) times daily.  11  . losartan (COZAAR) 25 MG tablet Take 1 tablet (25 mg total) by mouth daily. 30 tablet 3  . magnesium oxide (MAG-OX) 400 MG tablet Take 5 tabs daily ( 3 tabs in the AM and 2 tabs in the PM) 150 tablet 2  . metoprolol succinate (TOPROL-XL) 25 MG 24 hr tablet Take 1.5 tablets (37.5 mg total) by mouth 2 (two) times daily. 270 tablet 3  . potassium chloride SA (K-DUR,KLOR-CON) 20 MEQ tablet Take 1-2 tablets (20-40 mEq total) by mouth as directed. Take 2 tabs in the AM and 1 tab in the PM 90 tablet 3  . spironolactone (ALDACTONE) 25 MG tablet Take 1 tablet (25 mg total) by mouth every evening. 90 tablet 3  . traMADol (ULTRAM) 50 MG tablet Take 50 mg by mouth every 6 (six) hours as needed for moderate pain. Reported on 10/16/2015    . tretinoin (RETIN-A) 0.1 % cream Apply 1 application topically every morning.     . warfarin (COUMADIN) 2.5 MG tablet TAKE AS DIRECTED BY COUMADIN CLINIC 90 tablet 1  . zolpidem (AMBIEN) 5 MG tablet Take 5 mg by mouth at bedtime as needed for sleep.     No current facility-administered medications for this encounter.    BP 122/74   Pulse 60   Wt 135 lb 8 oz (61.5 kg)   SpO2 99%   BMI 22.55 kg/m    Wt Readings from Last 3 Encounters:  12/16/15 135 lb 8 oz (61.5 kg)  11/03/15 134 lb 3.2 oz (60.9 kg)  10/16/15 132 lb (59.9 kg)    General: NAD Neck: No JVD appreciated, no thyromegaly or thyroid nodule.  Lungs: CTAB, normal  effort CV: Nondisplaced PMI.  Heart regular S1/S2, no S3/S4, no murmur.  No peripheral edema.  No carotid bruit.  Normal pedal pulses.  Abdomen: Soft, NT, ND, no HSM. No bruits or masses. +BS   Skin: Intact without lesions or rashes.  Neurologic: Alert and oriented x 3.  Psych: Normal affect. Extremities: No clubbing or cyanosis.  HEENT: Normal.   Assessment/Plan: 1. Chronic systolic CHF: EF 0000000 with diffuse hypokinesis on 12/16 echo.  Nonischemic cardiomyopathy, probably related to Adriamycin with ovarian cancer treatment.  Cardiac MRI in 1/17 showed EF 29% with normal RV and septal-lateral dyssynchrony.  There was a non-cardiac pattern of LGE in the septum, cannot rule out prior myocarditis based on this pattern.  She has a Medtronic CRT-D  device.  Echo from today was reviewed, suspect some improvement in EF, estimate 35%.  NYHA class II symptoms currently.  She looks euvolemic on exam.    - I will try to increase Toprol XL to 37.5 mg bid.   - Continue Lasix 40 bid and spironolactone 25 daily. BMET today.  - Keep losartan at 25 mg qhs given prior orthostatic-type symptoms.   2. Atrial fibrillation: Paroxysmal.  Maintaining NSR with amiodarone.  - Continue amiodarone at 100 mg daily. LFTs have been trending down, repeat today along with TSH.  She will need periodic eye exam as well.  - Continue warfarin.  3. Elevated LFTs: This may be due to CHF versus amiodarone.  Amiodarone decreased to 100 mg daily.  Abdominal US did not show liver abnormalities or gallstones, CT abdomen showed normal liver in 7/17.  HCV/HBV serologies negative, ANA negative, ASMA positive but not thought to be significant.    Followup in 2 months  [name] 12/16/2015

## 2015-12-16 NOTE — Progress Notes (Signed)
*  PRELIMINARY RESULTS* Echocardiogram 2D Echocardiogram has been performed.  Leavy Cella 12/16/2015, 9:58 AM

## 2015-12-16 NOTE — Patient Instructions (Signed)
Routine lab work today. Will notify you of abnormal results  Increase Metoprolol to 37.5mg  (1 &1/2 tablets) twice daily.  Follow up with Dr.McLean in 2 months

## 2015-12-29 ENCOUNTER — Other Ambulatory Visit (HOSPITAL_COMMUNITY): Payer: Self-pay | Admitting: *Deleted

## 2015-12-29 ENCOUNTER — Encounter: Payer: Self-pay | Admitting: Cardiology

## 2015-12-29 MED ORDER — LOSARTAN POTASSIUM 25 MG PO TABS: 25.0000 mg | ORAL_TABLET | Freq: Every day | ORAL | 3 refills | Status: DC

## 2015-12-31 ENCOUNTER — Telehealth: Payer: Self-pay | Admitting: Internal Medicine

## 2015-12-31 NOTE — Telephone Encounter (Signed)
New message    Pt calling to get a device ck. The recall states she does not need to be seen until 06/2016. Pt would like an order put in for the device ck. Please call.

## 2015-12-31 NOTE — Telephone Encounter (Signed)
Returned patient call. She will be out of town at the time of her remote transmission. Re-scheduled her appt to 9/28.

## 2016-01-14 ENCOUNTER — Ambulatory Visit (INDEPENDENT_AMBULATORY_CARE_PROVIDER_SITE_OTHER): Payer: Medicare Other | Admitting: Pharmacist

## 2016-01-14 ENCOUNTER — Ambulatory Visit (INDEPENDENT_AMBULATORY_CARE_PROVIDER_SITE_OTHER): Payer: Medicare Other | Admitting: *Deleted

## 2016-01-14 DIAGNOSIS — I429 Cardiomyopathy, unspecified: Secondary | ICD-10-CM

## 2016-01-14 DIAGNOSIS — I4891 Unspecified atrial fibrillation: Secondary | ICD-10-CM

## 2016-01-14 DIAGNOSIS — I428 Other cardiomyopathies: Secondary | ICD-10-CM

## 2016-01-14 LAB — POCT INR: INR: 2.9

## 2016-01-14 NOTE — Progress Notes (Signed)
Remote ICD transmission.   

## 2016-01-20 ENCOUNTER — Encounter: Payer: Self-pay | Admitting: Cardiology

## 2016-01-25 DIAGNOSIS — Z23 Encounter for immunization: Secondary | ICD-10-CM | POA: Diagnosis not present

## 2016-02-03 ENCOUNTER — Encounter: Payer: Self-pay | Admitting: Cardiology

## 2016-02-11 LAB — CUP PACEART REMOTE DEVICE CHECK
Battery Remaining Longevity: 77 mo
Battery Voltage: 2.99 V
Brady Statistic AP VP Percent: 78.24 %
Brady Statistic AP VS Percent: 0.75 %
Brady Statistic AS VP Percent: 20.73 %
Brady Statistic AS VS Percent: 0.28 %
Brady Statistic RA Percent Paced: 78.99 %
Brady Statistic RV Percent Paced: 85.73 %
Date Time Interrogation Session: 20170928125524
HighPow Impedance: 72 Ohm
Implantable Lead Implant Date: 20170328
Implantable Lead Implant Date: 20170328
Implantable Lead Implant Date: 20170328
Implantable Lead Location: 753858
Implantable Lead Location: 753859
Implantable Lead Location: 753860
Implantable Lead Model: 4598
Implantable Lead Model: 5076
Implantable Lead Model: 6935
Lead Channel Impedance Value: 361 Ohm
Lead Channel Impedance Value: 399 Ohm
Lead Channel Impedance Value: 399 Ohm
Lead Channel Impedance Value: 418 Ohm
Lead Channel Impedance Value: 456 Ohm
Lead Channel Impedance Value: 551 Ohm
Lead Channel Impedance Value: 551 Ohm
Lead Channel Impedance Value: 589 Ohm
Lead Channel Impedance Value: 665 Ohm
Lead Channel Impedance Value: 722 Ohm
Lead Channel Impedance Value: 836 Ohm
Lead Channel Impedance Value: 874 Ohm
Lead Channel Impedance Value: 931 Ohm
Lead Channel Pacing Threshold Amplitude: 0.75 V
Lead Channel Pacing Threshold Amplitude: 0.75 V
Lead Channel Pacing Threshold Amplitude: 1 V
Lead Channel Pacing Threshold Pulse Width: 0.4 ms
Lead Channel Pacing Threshold Pulse Width: 0.4 ms
Lead Channel Pacing Threshold Pulse Width: 0.4 ms
Lead Channel Sensing Intrinsic Amplitude: 0.5 mV
Lead Channel Sensing Intrinsic Amplitude: 0.5 mV
Lead Channel Sensing Intrinsic Amplitude: 10.25 mV
Lead Channel Sensing Intrinsic Amplitude: 10.25 mV
Lead Channel Setting Pacing Amplitude: 2 V
Lead Channel Setting Pacing Amplitude: 2.5 V
Lead Channel Setting Pacing Amplitude: 2.75 V
Lead Channel Setting Pacing Pulse Width: 0.4 ms
Lead Channel Setting Pacing Pulse Width: 0.4 ms
Lead Channel Setting Sensing Sensitivity: 0.3 mV

## 2016-02-16 ENCOUNTER — Other Ambulatory Visit: Payer: Self-pay | Admitting: Cardiology

## 2016-02-25 ENCOUNTER — Ambulatory Visit (INDEPENDENT_AMBULATORY_CARE_PROVIDER_SITE_OTHER): Payer: Medicare Other | Admitting: *Deleted

## 2016-02-25 DIAGNOSIS — I4891 Unspecified atrial fibrillation: Secondary | ICD-10-CM | POA: Diagnosis not present

## 2016-02-25 LAB — POCT INR: INR: 2.5

## 2016-02-28 ENCOUNTER — Other Ambulatory Visit: Payer: Self-pay | Admitting: Cardiology

## 2016-03-01 DIAGNOSIS — H2513 Age-related nuclear cataract, bilateral: Secondary | ICD-10-CM | POA: Diagnosis not present

## 2016-03-01 DIAGNOSIS — H5203 Hypermetropia, bilateral: Secondary | ICD-10-CM | POA: Diagnosis not present

## 2016-03-14 ENCOUNTER — Other Ambulatory Visit: Payer: Self-pay | Admitting: Internal Medicine

## 2016-03-15 NOTE — Telephone Encounter (Signed)
Faxed to pharmacy.,

## 2016-04-07 ENCOUNTER — Ambulatory Visit (INDEPENDENT_AMBULATORY_CARE_PROVIDER_SITE_OTHER): Payer: Medicare Other | Admitting: *Deleted

## 2016-04-07 DIAGNOSIS — I4891 Unspecified atrial fibrillation: Secondary | ICD-10-CM | POA: Diagnosis not present

## 2016-04-07 LAB — POCT INR: INR: 2.6

## 2016-04-14 ENCOUNTER — Ambulatory Visit (INDEPENDENT_AMBULATORY_CARE_PROVIDER_SITE_OTHER): Payer: Medicare Other | Admitting: *Deleted

## 2016-04-14 DIAGNOSIS — I428 Other cardiomyopathies: Secondary | ICD-10-CM

## 2016-04-14 NOTE — Progress Notes (Signed)
Remote ICD transmission.   

## 2016-04-15 ENCOUNTER — Encounter: Payer: Self-pay | Admitting: Cardiology

## 2016-04-16 ENCOUNTER — Other Ambulatory Visit: Payer: Self-pay | Admitting: Internal Medicine

## 2016-04-16 ENCOUNTER — Other Ambulatory Visit: Payer: Self-pay | Admitting: Cardiology

## 2016-04-21 ENCOUNTER — Ambulatory Visit (INDEPENDENT_AMBULATORY_CARE_PROVIDER_SITE_OTHER): Payer: Medicare Other | Admitting: Internal Medicine

## 2016-04-21 ENCOUNTER — Encounter: Payer: Self-pay | Admitting: Internal Medicine

## 2016-04-21 ENCOUNTER — Other Ambulatory Visit (HOSPITAL_COMMUNITY): Payer: Self-pay | Admitting: Student

## 2016-04-21 VITALS — BP 114/78 | HR 62 | Temp 97.7°F | Resp 14 | Ht 65.0 in | Wt 137.2 lb

## 2016-04-21 DIAGNOSIS — G4700 Insomnia, unspecified: Secondary | ICD-10-CM

## 2016-04-21 DIAGNOSIS — Z0000 Encounter for general adult medical examination without abnormal findings: Secondary | ICD-10-CM | POA: Diagnosis not present

## 2016-04-21 DIAGNOSIS — M1990 Unspecified osteoarthritis, unspecified site: Secondary | ICD-10-CM

## 2016-04-21 NOTE — Assessment & Plan Note (Signed)
Refill ambien as needed, takes every 3rd night with good success. We did discuss the risk of side effects and she wishes to continue.

## 2016-04-21 NOTE — Assessment & Plan Note (Signed)
Rx for voltaren gel refilled as needed. This is helpful to her as it helps her avoid oral meds.

## 2016-04-21 NOTE — Progress Notes (Signed)
Pre visit review using our clinic review tool, if applicable. No additional management support is needed unless otherwise documented below in the visit note. 

## 2016-04-21 NOTE — Patient Instructions (Signed)
We do not need blood work today.   Health Maintenance, Female Introduction Adopting a healthy lifestyle and getting preventive care can go a long way to promote health and wellness. Talk with your health care provider about what schedule of regular examinations is right for you. This is a good chance for you to check in with your provider about disease prevention and staying healthy. In between checkups, there are plenty of things you can do on your own. Experts have done a lot of research about which lifestyle changes and preventive measures are most likely to keep you healthy. Ask your health care provider for more information. Weight and diet Eat a healthy diet  Be sure to include plenty of vegetables, fruits, low-fat dairy products, and lean protein.  Do not eat a lot of foods high in solid fats, added sugars, or salt.  Get regular exercise. This is one of the most important things you can do for your health.  Most adults should exercise for at least 150 minutes each week. The exercise should increase your heart rate and make you sweat (moderate-intensity exercise).  Most adults should also do strengthening exercises at least twice a week. This is in addition to the moderate-intensity exercise. Maintain a healthy weight  Body mass index (BMI) is a measurement that can be used to identify possible weight problems. It estimates body fat based on height and weight. Your health care provider can help determine your BMI and help you achieve or maintain a healthy weight.  For females 19 years of age and older:  A BMI below 18.5 is considered underweight.  A BMI of 18.5 to 24.9 is normal.  A BMI of 25 to 29.9 is considered overweight.  A BMI of 30 and above is considered obese. Watch levels of cholesterol and blood lipids  You should start having your blood tested for lipids and cholesterol at 79 years of age, then have this test every 5 years.  You may need to have your cholesterol  levels checked more often if:  Your lipid or cholesterol levels are high.  You are older than 79 years of age.  You are at high risk for heart disease. Cancer screening Lung Cancer  Lung cancer screening is recommended for adults 34-23 years old who are at high risk for lung cancer because of a history of smoking.  A yearly low-dose CT scan of the lungs is recommended for people who:  Currently smoke.  Have quit within the past 15 years.  Have at least a 30-pack-year history of smoking. A pack year is smoking an average of one pack of cigarettes a day for 1 year.  Yearly screening should continue until it has been 15 years since you quit.  Yearly screening should stop if you develop a health problem that would prevent you from having lung cancer treatment. Breast Cancer  Practice breast self-awareness. This means understanding how your breasts normally appear and feel.  It also means doing regular breast self-exams. Let your health care provider know about any changes, no matter how small.  If you are in your 20s or 30s, you should have a clinical breast exam (CBE) by a health care provider every 1-3 years as part of a regular health exam.  If you are 33 or older, have a CBE every year. Also consider having a breast X-ray (mammogram) every year.  If you have a family history of breast cancer, talk to your health care provider about genetic screening.  If you are at high risk for breast cancer, talk to your health care provider about having an MRI and a mammogram every year.  Breast cancer gene (BRCA) assessment is recommended for women who have family members with BRCA-related cancers. BRCA-related cancers include:  Breast.  Ovarian.  Tubal.  Peritoneal cancers.  Results of the assessment will determine the need for genetic counseling and BRCA1 and BRCA2 testing. Cervical Cancer  Your health care provider may recommend that you be screened regularly for cancer of the  pelvic organs (ovaries, uterus, and vagina). This screening involves a pelvic examination, including checking for microscopic changes to the surface of your cervix (Pap test). You may be encouraged to have this screening done every 3 years, beginning at age 36.  For women ages 37-65, health care providers may recommend pelvic exams and Pap testing every 3 years, or they may recommend the Pap and pelvic exam, combined with testing for human papilloma virus (HPV), every 5 years. Some types of HPV increase your risk of cervical cancer. Testing for HPV may also be done on women of any age with unclear Pap test results.  Other health care providers may not recommend any screening for nonpregnant women who are considered low risk for pelvic cancer and who do not have symptoms. Ask your health care provider if a screening pelvic exam is right for you.  If you have had past treatment for cervical cancer or a condition that could lead to cancer, you need Pap tests and screening for cancer for at least 20 years after your treatment. If Pap tests have been discontinued, your risk factors (such as having a new sexual partner) need to be reassessed to determine if screening should resume. Some women have medical problems that increase the chance of getting cervical cancer. In these cases, your health care provider may recommend more frequent screening and Pap tests. Colorectal Cancer  This type of cancer can be detected and often prevented.  Routine colorectal cancer screening usually begins at 79 years of age and continues through 79 years of age.  Your health care provider may recommend screening at an earlier age if you have risk factors for colon cancer.  Your health care provider may also recommend using home test kits to check for hidden blood in the stool.  A small camera at the end of a tube can be used to examine your colon directly (sigmoidoscopy or colonoscopy). This is done to check for the earliest  forms of colorectal cancer.  Routine screening usually begins at age 54.  Direct examination of the colon should be repeated every 5-10 years through 79 years of age. However, you may need to be screened more often if early forms of precancerous polyps or small growths are found. Skin Cancer  Check your skin from head to toe regularly.  Tell your health care provider about any new moles or changes in moles, especially if there is a change in a mole's shape or color.  Also tell your health care provider if you have a mole that is larger than the size of a pencil eraser.  Always use sunscreen. Apply sunscreen liberally and repeatedly throughout the day.  Protect yourself by wearing long sleeves, pants, a wide-brimmed hat, and sunglasses whenever you are outside. Heart disease, diabetes, and high blood pressure  High blood pressure causes heart disease and increases the risk of stroke. High blood pressure is more likely to develop in:  People who have blood pressure in the  high end of the normal range (130-139/85-89 mm Hg).  People who are overweight or obese.  People who are African American.  If you are 18-39 years of age, have your blood pressure checked every 3-5 years. If you are 40 years of age or older, have your blood pressure checked every year. You should have your blood pressure measured twice-once when you are at a hospital or clinic, and once when you are not at a hospital or clinic. Record the average of the two measurements. To check your blood pressure when you are not at a hospital or clinic, you can use:  An automated blood pressure machine at a pharmacy.  A home blood pressure monitor.  If you are between 55 years and 79 years old, ask your health care provider if you should take aspirin to prevent strokes.  Have regular diabetes screenings. This involves taking a blood sample to check your fasting blood sugar level.  If you are at a normal weight and have a low  risk for diabetes, have this test once every three years after 79 years of age.  If you are overweight and have a high risk for diabetes, consider being tested at a younger age or more often. Preventing infection Hepatitis B  If you have a higher risk for hepatitis B, you should be screened for this virus. You are considered at high risk for hepatitis B if:  You were born in a country where hepatitis B is common. Ask your health care provider which countries are considered high risk.  Your parents were born in a high-risk country, and you have not been immunized against hepatitis B (hepatitis B vaccine).  You have HIV or AIDS.  You use needles to inject street drugs.  You live with someone who has hepatitis B.  You have had sex with someone who has hepatitis B.  You get hemodialysis treatment.  You take certain medicines for conditions, including cancer, organ transplantation, and autoimmune conditions. Hepatitis C  Blood testing is recommended for:  Everyone born from 1945 through 1965.  Anyone with known risk factors for hepatitis C. Sexually transmitted infections (STIs)  You should be screened for sexually transmitted infections (STIs) including gonorrhea and chlamydia if:  You are sexually active and are younger than 79 years of age.  You are older than 79 years of age and your health care provider tells you that you are at risk for this type of infection.  Your sexual activity has changed since you were last screened and you are at an increased risk for chlamydia or gonorrhea. Ask your health care provider if you are at risk.  If you do not have HIV, but are at risk, it may be recommended that you take a prescription medicine daily to prevent HIV infection. This is called pre-exposure prophylaxis (PrEP). You are considered at risk if:  You are sexually active and do not regularly use condoms or know the HIV status of your partner(s).  You take drugs by  injection.  You are sexually active with a partner who has HIV. Talk with your health care provider about whether you are at high risk of being infected with HIV. If you choose to begin PrEP, you should first be tested for HIV. You should then be tested every 3 months for as long as you are taking PrEP. Pregnancy  If you are premenopausal and you may become pregnant, ask your health care provider about preconception counseling.  If you may become   pregnant, take 400 to 800 micrograms (mcg) of folic acid every day.  If you want to prevent pregnancy, talk to your health care provider about birth control (contraception). Osteoporosis and menopause  Osteoporosis is a disease in which the bones lose minerals and strength with aging. This can result in serious bone fractures. Your risk for osteoporosis can be identified using a bone density scan.  If you are 65 years of age or older, or if you are at risk for osteoporosis and fractures, ask your health care provider if you should be screened.  Ask your health care provider whether you should take a calcium or vitamin D supplement to lower your risk for osteoporosis.  Menopause may have certain physical symptoms and risks.  Hormone replacement therapy may reduce some of these symptoms and risks. Talk to your health care provider about whether hormone replacement therapy is right for you. Follow these instructions at home:  Schedule regular health, dental, and eye exams.  Stay current with your immunizations.  Do not use any tobacco products including cigarettes, chewing tobacco, or electronic cigarettes.  If you are pregnant, do not drink alcohol.  If you are breastfeeding, limit how much and how often you drink alcohol.  Limit alcohol intake to no more than 1 drink per day for nonpregnant women. One drink equals 12 ounces of beer, 5 ounces of wine, or 1 ounces of hard liquor.  Do not use street drugs.  Do not share needles.  Ask  your health care provider for help if you need support or information about quitting drugs.  Tell your health care provider if you often feel depressed.  Tell your health care provider if you have ever been abused or do not feel safe at home. This information is not intended to replace advice given to you by your health care provider. Make sure you discuss any questions you have with your health care provider. Document Released: 10/18/2010 Document Revised: 09/10/2015 Document Reviewed: 01/06/2015  2017 Elsevier  

## 2016-04-21 NOTE — Progress Notes (Signed)
   Subjective:    Patient ID: Cynthia Frank, female    DOB: 10-29-1935, 82 y.o.   MRN: [id number]  HPI Here for medicare wellness and follow up on medical problems, no new complaints. Please see A/P for status and treatment of chronic medical problems.   HPI #2: Follow up on medical conditions including her insomnia (taking ambien rarely, denies falls or confusion with use, tries to use sparingly, has tried behavioral changes in the past which were ineffective), and her arthritis (uses voltaren gel to help, this is generally effective, helps her to avoid oral meds).   Diet: heart healthy Physical activity: sedentary Depression/mood screen: negative Hearing: intact to whispered voice with bilateral aids Visual acuity: grossly normal with lens, performs annual eye exam  ADLs: capable Fall risk: low Home safety: good Cognitive evaluation: intact to orientation, naming, recall and repetition EOL planning: adv directives discussed  I have personally reviewed and have noted 1. The patient's medical and social history - reviewed today no changes 2. Their use of alcohol, tobacco or illicit drugs 3. Their current medications and supplements 4. The patient's functional ability including ADL's, fall risks, home safety risks and hearing or visual impairment. 5. Diet and physical activities 6. Evidence for depression or mood disorders 7. Care team reviewed and updated (available in snapshot)  Review of Systems  Constitutional: Negative.   HENT: Negative.   Eyes: Negative.   Respiratory: Negative for cough, chest tightness and shortness of breath.   Cardiovascular: Negative for chest pain, palpitations and leg swelling.  Gastrointestinal: Negative for abdominal distention, abdominal pain, constipation, diarrhea, nausea and vomiting.  Musculoskeletal: Negative.   Skin: Negative.   Neurological: Negative.   Psychiatric/Behavioral: Negative.       Objective:   Physical Exam    Constitutional: She is oriented to person, place, and time. She appears well-developed and well-nourished.  HENT:  Head: Normocephalic and atraumatic.  Eyes: EOM are normal.  Neck: Normal range of motion.  Cardiovascular: Normal rate and regular rhythm.   Pulmonary/Chest: Effort normal and breath sounds normal. No respiratory distress. She has no wheezes. She has no rales.  Abdominal: Soft. Bowel sounds are normal. She exhibits no distension. There is no tenderness. There is no rebound.  Musculoskeletal: She exhibits no edema.  Neurological: She is alert and oriented to person, place, and time. Coordination normal.  Skin: Skin is warm and dry.  Psychiatric: She has a normal mood and affect.   Vitals:   04/21/16 1109  BP: 114/78  Pulse: 62  Resp: 14  Temp: 97.7 F (36.5 C)  TempSrc: Oral  SpO2: 99%  Weight: 137 lb 4 oz (62.3 kg)  Height: 5\' 5"  (1.651 m)      Assessment & Plan:

## 2016-04-21 NOTE — Assessment & Plan Note (Signed)
Has had dexa in the past and refuses another as she does not want to take any meds for it. Declines tetanus today. Flu shot up to date. Pneumonia shot allergy. Shingles completed. Colonoscopy just done and with polyps due again in 2020. Counseled about sun safety and mole surveillance. Given 10 year screening recommendations.

## 2016-04-22 LAB — CUP PACEART REMOTE DEVICE CHECK
Battery Remaining Longevity: 81 mo
Battery Voltage: 2.99 V
Brady Statistic AP VP Percent: 68.75 %
Brady Statistic AP VS Percent: 0.88 %
Brady Statistic AS VP Percent: 29.97 %
Brady Statistic AS VS Percent: 0.4 %
Brady Statistic RA Percent Paced: 69.62 %
Brady Statistic RV Percent Paced: 74.99 %
Date Time Interrogation Session: 20171228102510
HighPow Impedance: 74 Ohm
Implantable Lead Implant Date: 20170328
Implantable Lead Implant Date: 20170328
Implantable Lead Implant Date: 20170328
Implantable Lead Location: 753858
Implantable Lead Location: 753859
Implantable Lead Location: 753860
Implantable Lead Model: 4598
Implantable Lead Model: 5076
Implantable Lead Model: 6935
Implantable Pulse Generator Implant Date: 20170328
Lead Channel Impedance Value: 342 Ohm
Lead Channel Impedance Value: 361 Ohm
Lead Channel Impedance Value: 399 Ohm
Lead Channel Impedance Value: 418 Ohm
Lead Channel Impedance Value: 456 Ohm
Lead Channel Impedance Value: 513 Ohm
Lead Channel Impedance Value: 513 Ohm
Lead Channel Impedance Value: 589 Ohm
Lead Channel Impedance Value: 646 Ohm
Lead Channel Impedance Value: 703 Ohm
Lead Channel Impedance Value: 874 Ohm
Lead Channel Impedance Value: 931 Ohm
Lead Channel Impedance Value: 931 Ohm
Lead Channel Pacing Threshold Amplitude: 0.75 V
Lead Channel Pacing Threshold Amplitude: 0.75 V
Lead Channel Pacing Threshold Amplitude: 0.875 V
Lead Channel Pacing Threshold Pulse Width: 0.4 ms
Lead Channel Pacing Threshold Pulse Width: 0.4 ms
Lead Channel Pacing Threshold Pulse Width: 0.4 ms
Lead Channel Sensing Intrinsic Amplitude: 0.5 mV
Lead Channel Sensing Intrinsic Amplitude: 0.5 mV
Lead Channel Sensing Intrinsic Amplitude: 9.25 mV
Lead Channel Sensing Intrinsic Amplitude: 9.25 mV
Lead Channel Setting Pacing Amplitude: 2 V
Lead Channel Setting Pacing Amplitude: 2.25 V
Lead Channel Setting Pacing Amplitude: 2.5 V
Lead Channel Setting Pacing Pulse Width: 0.4 ms
Lead Channel Setting Pacing Pulse Width: 0.4 ms
Lead Channel Setting Sensing Sensitivity: 0.3 mV

## 2016-04-29 ENCOUNTER — Encounter: Payer: Self-pay | Admitting: Cardiology

## 2016-05-19 ENCOUNTER — Other Ambulatory Visit: Payer: Self-pay | Admitting: Cardiology

## 2016-05-19 ENCOUNTER — Ambulatory Visit (INDEPENDENT_AMBULATORY_CARE_PROVIDER_SITE_OTHER): Payer: Medicare Other | Admitting: *Deleted

## 2016-05-19 DIAGNOSIS — I4891 Unspecified atrial fibrillation: Secondary | ICD-10-CM | POA: Diagnosis not present

## 2016-05-19 LAB — POCT INR: INR: 2

## 2016-05-27 ENCOUNTER — Encounter (HOSPITAL_COMMUNITY): Payer: Medicare Other

## 2016-05-30 ENCOUNTER — Other Ambulatory Visit: Payer: Self-pay | Admitting: Internal Medicine

## 2016-05-30 ENCOUNTER — Other Ambulatory Visit (HOSPITAL_COMMUNITY): Payer: Self-pay | Admitting: Student

## 2016-05-31 ENCOUNTER — Ambulatory Visit (HOSPITAL_COMMUNITY)
Admission: RE | Admit: 2016-05-31 | Discharge: 2016-05-31 | Disposition: A | Discharge status: Home / Self Care | Payer: Medicare Other | Source: Ambulatory Visit | Attending: Cardiology | Admitting: Cardiology

## 2016-05-31 VITALS — BP 125/74 | HR 66 | Wt 139.2 lb

## 2016-05-31 DIAGNOSIS — Z7901 Long term (current) use of anticoagulants: Secondary | ICD-10-CM | POA: Insufficient documentation

## 2016-05-31 DIAGNOSIS — E784 Other hyperlipidemia: Secondary | ICD-10-CM | POA: Diagnosis not present

## 2016-05-31 DIAGNOSIS — E7849 Other hyperlipidemia: Secondary | ICD-10-CM

## 2016-05-31 DIAGNOSIS — K219 Gastro-esophageal reflux disease without esophagitis: Secondary | ICD-10-CM | POA: Diagnosis not present

## 2016-05-31 DIAGNOSIS — Z85038 Personal history of other malignant neoplasm of large intestine: Secondary | ICD-10-CM | POA: Insufficient documentation

## 2016-05-31 DIAGNOSIS — I429 Cardiomyopathy, unspecified: Secondary | ICD-10-CM | POA: Insufficient documentation

## 2016-05-31 DIAGNOSIS — I480 Paroxysmal atrial fibrillation: Secondary | ICD-10-CM | POA: Diagnosis not present

## 2016-05-31 DIAGNOSIS — E785 Hyperlipidemia, unspecified: Secondary | ICD-10-CM | POA: Diagnosis not present

## 2016-05-31 DIAGNOSIS — I428 Other cardiomyopathies: Secondary | ICD-10-CM

## 2016-05-31 DIAGNOSIS — I493 Ventricular premature depolarization: Secondary | ICD-10-CM | POA: Diagnosis not present

## 2016-05-31 DIAGNOSIS — I110 Hypertensive heart disease with heart failure: Secondary | ICD-10-CM | POA: Insufficient documentation

## 2016-05-31 DIAGNOSIS — Z9889 Other specified postprocedural states: Secondary | ICD-10-CM | POA: Diagnosis not present

## 2016-05-31 DIAGNOSIS — I4891 Unspecified atrial fibrillation: Secondary | ICD-10-CM | POA: Diagnosis not present

## 2016-05-31 DIAGNOSIS — Z87891 Personal history of nicotine dependence: Secondary | ICD-10-CM | POA: Diagnosis not present

## 2016-05-31 DIAGNOSIS — Z8543 Personal history of malignant neoplasm of ovary: Secondary | ICD-10-CM | POA: Insufficient documentation

## 2016-05-31 DIAGNOSIS — I5022 Chronic systolic (congestive) heart failure: Secondary | ICD-10-CM | POA: Insufficient documentation

## 2016-05-31 LAB — COMPREHENSIVE METABOLIC PANEL
ALT: 28 U/L (ref 14–54)
AST: 36 U/L (ref 15–41)
Albumin: 4.1 g/dL (ref 3.5–5.0)
Alkaline Phosphatase: 67 U/L (ref 38–126)
Anion gap: 9 (ref 5–15)
BUN: 18 mg/dL (ref 6–20)
CO2: 26 mmol/L (ref 22–32)
Calcium: 9.4 mg/dL (ref 8.9–10.3)
Chloride: 99 mmol/L — ABNORMAL LOW (ref 101–111)
Creatinine, Ser: 1.12 mg/dL — ABNORMAL HIGH (ref 0.44–1.00)
GFR calc Af Amer: 53 mL/min — ABNORMAL LOW (ref >60)
GFR calc non Af Amer: 46 mL/min — ABNORMAL LOW (ref >60)
Glucose, Bld: 89 mg/dL (ref 65–99)
Potassium: 4.1 mmol/L (ref 3.5–5.1)
Sodium: 134 mmol/L — ABNORMAL LOW (ref 135–145)
Total Bilirubin: 0.8 mg/dL (ref 0.3–1.2)
Total Protein: 6.8 g/dL (ref 6.5–8.1)

## 2016-05-31 LAB — CBC
HCT: 40.2 % (ref 36.0–46.0)
Hemoglobin: 13.2 g/dL (ref 12.0–15.0)
MCH: 30.1 pg (ref 26.0–34.0)
MCHC: 32.8 g/dL (ref 30.0–36.0)
MCV: 91.8 fL (ref 78.0–100.0)
Platelets: 133 10*3/uL — ABNORMAL LOW (ref 150–400)
RBC: 4.38 MIL/uL (ref 3.87–5.11)
RDW: 13.4 % (ref 11.5–15.5)
WBC: 5.2 10*3/uL (ref 4.0–10.5)

## 2016-05-31 LAB — LIPID PANEL
Cholesterol: 150 mg/dL (ref 0–200)
HDL: 84 mg/dL (ref >40)
LDL Cholesterol: 47 mg/dL (ref 0–99)
Total CHOL/HDL Ratio: 1.8 RATIO
Triglycerides: 96 mg/dL (ref <150)
VLDL: 19 mg/dL (ref 0–40)

## 2016-05-31 LAB — MAGNESIUM: Magnesium: 1.7 mg/dL (ref 1.7–2.4)

## 2016-05-31 LAB — TSH: TSH: 6.015 u[IU]/mL — ABNORMAL HIGH (ref 0.350–4.500)

## 2016-05-31 MED ORDER — LOSARTAN POTASSIUM 25 MG PO TABS
25.0000 mg | ORAL_TABLET | Freq: Two times a day (BID) | ORAL | 3 refills | Status: DC
Start: 2016-05-31 — End: 2016-08-29

## 2016-05-31 NOTE — Patient Instructions (Signed)
Increase Losartan 25 mg Twice daily   Labs today  Labs in 3 weeks  Your physician recommends that you schedule a follow-up appointment in: 3 months

## 2016-05-31 NOTE — Progress Notes (Signed)
Patient ID: Cynthia Frank, female   DOB: 1936/01/01, 81 y.o.   MRN: [id number]    Advanced Heart Failure Clinic Note   PCP: Dr. Sharlet Salina HF Cardiology: Dr. Aundra Dubin  81 yo with history of chronic systolic CHF/nonischemic cardiomyopathy, paroxysmal atrial fibrillation, and prior ovarian and colon cancers presents for evaluation of CHF. She has had a cardiomyopathy known for about 15 years now. Cardiac cath at diagnosis showed mild nonobstructive disease.  EF has been persistently low.  Cause has been thought to be Adriamycin; she received this as part of her ovarian cancer treatment. She has had paroxysmal atrial fibrillation and has remained in NSR with amiodarone use.  No recent atrial fibrillation symptoms, typically feels prolonged palpitations thought to be atrial fibrillation 2-3 times/year.   She has had dyspnea with moderate to heavy exertion for 4-5 years now.  However, prior to her initial appointment here, her symptoms had worsened.  She developed shortness of breath walking short distances on flat ground.  Lasix was increased to 40 mg bid and she lost weight and felt better.     Cardiac MRI was done in 1/17, showing EF 29% with normal RV.  Septal-lateral dyssynchrony was present and there was a non-coronary pattern of LGE in the septum.  LFTs were noted to be mildly elevated.  Amiodarone was decreased to 100 mg daily.   She had Medtronic CRT-D placed in 3/17. Repeat echo 8/17 showed EF 35% with diffuse hypokinesis, normal RV.   She presents today for regular followup. Overall feeling good, thinks better since CRT.  No lightheadedness.  No significant exertional dyspnea.  No chest pain.  No orthopnea/PND.  No palpitations.  Not getting much exercise and says she has not been watching her diet.  Weight up 4 lbs.  Optivol: Fluid index < threshold, stable impedance, no atrial fibrillation.  ECG: a-paced, BiV paced    Labs (1/17): K 4.2, creatinine 1.14, BNP 855 Labs (3/17): SPEP  negative, BNP 341, K 4.5, creatinine 1.27, AST 62, ALT 74, TSH normal with low free T3 and normal free T4, HCT 46.5 Labs (4/17): AST 64, ALT 67, BNP 278, K 3.6, creatinine 1.21 Labs (5/17): K 3.4, creatinine 1.39, AST 53, ALT 61, HBV/HCV negative, TSH mildly elevated, BNP 306 Labs (6/17): K 4.4, creatinine 1.36, free T4 normal, free T3 mildly decreased, AST 50, ALT 53. Labs (7/17): ANA negative, ASMA positive Labs (8/17): AST 45, ALT 37, K 3.8, creatinine 1.19, TSH normal  PMH: 1. Chronic systolic CHF: Nonischemic cardiomyopathy, thought to be related to adriamycin that she had for treatment of ovarian cancer.  NICM diagnosed ~ 2001.  - LHC ~ 2001 with 30% stenosis D1.   - Echo (2013) EF 25-30%.  - Echo (10/14) with EF 25-30%. - Echo (12/16) with EF 20-25%, mild LV dilation, restrictive diastolic function, moderate MR, severe LAE. - Cardiac MRI (1/17): Moderately dilated LV with EF 29%. Diffuse hypokinesis looking worse in the septal wall. Septal-lateral dyssynchrony. Normal RV size and systolic function.  Moderate MR with tethered posterior leaflet.  Mid-wall LGE in the basal to mid septum. This is not a coronary disease pattern. Could be suggestive of prior myocarditis. - Medtronic CRT-D placed 3/17.  - Echo (8/17): EF 35%, diffuse hypokinesis, normal RV size and systolic function, moderate MR.  2. Atrial fibrillation: Paroxysmal.  3. HTN 4. Ovarian cancer: 1985, s/p surgery. Received Adriamycin-based chemotherapy.  5. Colon cancer: 2007, s/p surgery.  6. PVCs 7. GERD 8. PFTs (8/16) without significant abnormality.  9. Prior syncope 10. Elevated transaminases: Uncertain etiology.   SH: Married, lives in [place], prior smoker.   FH: Father with MI.   ROS: All systems reviewed and negative except as per HPI.   Current Outpatient Prescriptions  Medication Sig Dispense Refill  . amiodarone (PACERONE) 200 MG tablet Take 0.5 tablets (100 mg total) by mouth daily. 45 tablet 3  .  atorvastatin (LIPITOR) 40 MG tablet TAKE 1 TABLET (40 MG TOTAL) BY MOUTH DAILY. 90 tablet 3  . busPIRone (BUSPAR) 5 MG tablet Take 1 tablet (5 mg total) by mouth daily as needed (anxiety). 30 tablet 3  . Coenzyme Q10 (COQ10) 100 MG CAPS Take 100 mg by mouth daily.     Marland Kitchen conjugated estrogens (PREMARIN) vaginal cream Place 1 Applicatorful vaginally as needed (dyspareunia). Reported on 05/11/2015    . diclofenac sodium (VOLTAREN) 1 % GEL APPLY 2 GRAMS TOPICALLY 4TIMES DAILY 100 g 6  . furosemide (LASIX) 40 MG tablet TAKE 1 TABLET(40 MG) BY MOUTH TWICE DAILY 180 tablet 3  . ipratropium (ATROVENT) 0.03 % nasal spray Place 2 sprays into both nostrils 2 (two) times daily.  11  . losartan (COZAAR) 25 MG tablet Take 1 tablet (25 mg total) by mouth 2 (two) times daily. 60 tablet 3  . Magnesium Oxide 500 MG CAPS Take 2,000 mg by mouth daily.    . metoprolol succinate (TOPROL-XL) 25 MG 24 hr tablet Take 1.5 tablets (37.5 mg total) by mouth 2 (two) times daily. 270 tablet 3  . potassium chloride SA (K-DUR,KLOR-CON) 20 MEQ tablet Take 1-2 tablets (20-40 mEq total) by mouth as directed. Take 2 tabs in the AM and 1 tab in the PM 90 tablet 3  . spironolactone (ALDACTONE) 25 MG tablet Take 1 tablet (25 mg total) by mouth every evening. 90 tablet 3  . traMADol (ULTRAM) 50 MG tablet Take 50 mg by mouth every 6 (six) hours as needed for moderate pain. Reported on 10/16/2015    . tretinoin (RETIN-A) 0.1 % cream Apply 1 application topically every morning.     . warfarin (COUMADIN) 2.5 MG tablet TAKE AS DIRECTED BY COUMADIN CLINIC 90 tablet 0  . zolpidem (AMBIEN) 5 MG tablet TAKE 1 TABLET BY MOUTH EVERY DAY AT BEDTIME AS NEEDED 30 tablet 1   No current facility-administered medications for this encounter.    BP 125/74 (BP Location: Right Arm, Patient Position: Sitting, Cuff Size: Normal)   Pulse 66   Wt 139 lb 4 oz (63.2 kg)   SpO2 98%   BMI 23.17 kg/m    Wt Readings from Last 3 Encounters:  05/31/16 139 lb 4 oz  (63.2 kg)  04/21/16 137 lb 4 oz (62.3 kg)  12/16/15 135 lb 8 oz (61.5 kg)    General: NAD Neck: No JVD appreciated, no thyromegaly or thyroid nodule.  Lungs: CTAB, normal effort CV: Nondisplaced PMI.  Heart regular S1/S2, no S3/S4, no murmur.  No peripheral edema.  No carotid bruit.  Normal pedal pulses.  Abdomen: Soft, NT, ND, no HSM. No bruits or masses. +BS   Skin: Intact without lesions or rashes.  Neurologic: Alert and oriented x 3.  Psych: Normal affect. Extremities: No clubbing or cyanosis.  HEENT: Normal.   Assessment/Plan: 1. Chronic systolic CHF: EF 0000000 with diffuse hypokinesis on 12/16 echo.  Nonischemic cardiomyopathy, probably related to Adriamycin with ovarian cancer treatment.  Cardiac MRI in 1/17 showed EF 29% with normal RV and septal-lateral dyssynchrony.  There was a non-cardiac pattern of  LGE in the septum, cannot rule out prior myocarditis based on this pattern.  She has a Medtronic CRT-D device.  Echo 8/17 with EF 35%.  NYHA class II symptoms currently.  She looks euvolemic on exam.    - Continue Toprol XL 37.5 mg bid.   - Continue Lasix 40 bid and spironolactone 25 daily. BMET today.  - Increase losartan to 25 mg bid.  If she tolerates this without orthostatic symptoms, may be able to switch over to [place] eventually. BMET in 2-3 weeks.   2. Atrial fibrillation: Paroxysmal.  Maintaining NSR with amiodarone.  - Continue amiodarone at 100 mg daily. LFTs have been trending down, repeat today along with TSH.  She will need periodic eye exam as well.  - Continue warfarin, check CBC.  3. Elevated LFTs: This may be due to CHF versus amiodarone.  Amiodarone decreased to 100 mg daily.  Abdominal US did not show liver abnormalities or gallstones, CT abdomen showed normal liver in 7/17.  HCV/HBV serologies negative, ANA negative, ASMA positive but not thought to be significant.  Repeat LFTs today.  4. Hyperlipidemia: Check lipids today.   Followup in 3 months  [name] 05/31/2016

## 2016-06-01 ENCOUNTER — Ambulatory Visit (HOSPITAL_COMMUNITY)
Admission: RE | Admit: 2016-06-01 | Discharge: 2016-06-01 | Disposition: A | Discharge status: Home / Self Care | Payer: Medicare Other | Source: Ambulatory Visit | Attending: Cardiology | Admitting: Cardiology

## 2016-06-01 ENCOUNTER — Telehealth (HOSPITAL_COMMUNITY): Payer: Self-pay | Admitting: *Deleted

## 2016-06-01 DIAGNOSIS — I428 Other cardiomyopathies: Secondary | ICD-10-CM

## 2016-06-01 DIAGNOSIS — I509 Heart failure, unspecified: Secondary | ICD-10-CM | POA: Insufficient documentation

## 2016-06-01 LAB — BASIC METABOLIC PANEL
Anion gap: 11 (ref 5–15)
BUN: 21 mg/dL — ABNORMAL HIGH (ref 6–20)
CO2: 27 mmol/L (ref 22–32)
Calcium: 9.3 mg/dL (ref 8.9–10.3)
Chloride: 97 mmol/L — ABNORMAL LOW (ref 101–111)
Creatinine, Ser: 1.33 mg/dL — ABNORMAL HIGH (ref 0.44–1.00)
GFR calc Af Amer: 43 mL/min — ABNORMAL LOW (ref >60)
GFR calc non Af Amer: 37 mL/min — ABNORMAL LOW (ref >60)
Glucose, Bld: 99 mg/dL (ref 65–99)
Potassium: 4.3 mmol/L (ref 3.5–5.1)
Sodium: 135 mmol/L (ref 135–145)

## 2016-06-01 LAB — TSH: TSH: 4.331 u[IU]/mL (ref 0.350–4.500)

## 2016-06-01 LAB — T4, FREE: Free T4: 0.95 ng/dL (ref 0.61–1.12)

## 2016-06-01 NOTE — Telephone Encounter (Signed)
TSH  Order: [id number]  Status:  Final result Visible to patient:  Yes (MyChart) Dx:  Nonischemic cardiomyopathy ([place]); Atr...  Notes Recorded by Kennieth Rad, RN on 06/01/2016 at 8:42 AM EST Spoke with patient and she is aware and said she will come back in today to repeat TSH and free t3/t4. She is leaving to go out of town for 2 weeks. Lab orders placed and appointment made. ------  Notes Recorded by Larey Dresser, MD on 05/31/2016 at 4:23 PM EST Mg still on the lower side. Would not decrease Mg dose. Good lipids. TSH elevated, will need repeat TSH with free T3 and free T4 drawn. LFTs back to normal.

## 2016-06-02 LAB — T3, FREE: T3, Free: 2.1 pg/mL (ref 2.0–4.4)

## 2016-06-17 ENCOUNTER — Other Ambulatory Visit: Payer: Self-pay | Admitting: Cardiology

## 2016-06-21 ENCOUNTER — Other Ambulatory Visit (HOSPITAL_COMMUNITY): Payer: Self-pay | Admitting: *Deleted

## 2016-06-21 ENCOUNTER — Ambulatory Visit (HOSPITAL_COMMUNITY)
Admission: RE | Admit: 2016-06-21 | Discharge: 2016-06-21 | Disposition: A | Discharge status: Home / Self Care | Payer: Medicare Other | Source: Ambulatory Visit | Attending: Cardiology | Admitting: Cardiology

## 2016-06-21 DIAGNOSIS — I5022 Chronic systolic (congestive) heart failure: Secondary | ICD-10-CM | POA: Diagnosis not present

## 2016-06-21 LAB — BASIC METABOLIC PANEL
Anion gap: 9 (ref 5–15)
BUN: 21 mg/dL — ABNORMAL HIGH (ref 6–20)
CO2: 28 mmol/L (ref 22–32)
Calcium: 9.2 mg/dL (ref 8.9–10.3)
Chloride: 98 mmol/L — ABNORMAL LOW (ref 101–111)
Creatinine, Ser: 1.23 mg/dL — ABNORMAL HIGH (ref 0.44–1.00)
GFR calc Af Amer: 47 mL/min — ABNORMAL LOW (ref >60)
GFR calc non Af Amer: 41 mL/min — ABNORMAL LOW (ref >60)
Glucose, Bld: 148 mg/dL — ABNORMAL HIGH (ref 65–99)
Potassium: 4 mmol/L (ref 3.5–5.1)
Sodium: 135 mmol/L (ref 135–145)

## 2016-06-30 ENCOUNTER — Ambulatory Visit (INDEPENDENT_AMBULATORY_CARE_PROVIDER_SITE_OTHER): Payer: Medicare Other | Admitting: *Deleted

## 2016-06-30 DIAGNOSIS — I4891 Unspecified atrial fibrillation: Secondary | ICD-10-CM | POA: Diagnosis not present

## 2016-06-30 LAB — POCT INR: INR: 1.8

## 2016-07-06 ENCOUNTER — Other Ambulatory Visit (HOSPITAL_COMMUNITY): Payer: Self-pay | Admitting: Cardiology

## 2016-07-06 MED ORDER — SPIRONOLACTONE 25 MG PO TABS: 25.0000 mg | ORAL_TABLET | Freq: Every evening | ORAL | 3 refills | Status: DC

## 2016-07-08 DIAGNOSIS — J385 Laryngeal spasm: Secondary | ICD-10-CM | POA: Diagnosis not present

## 2016-07-19 ENCOUNTER — Encounter: Payer: Self-pay | Admitting: Internal Medicine

## 2016-07-19 ENCOUNTER — Ambulatory Visit (INDEPENDENT_AMBULATORY_CARE_PROVIDER_SITE_OTHER): Payer: Medicare Other | Admitting: Internal Medicine

## 2016-07-19 VITALS — BP 105/63 | HR 61 | Ht 65.0 in | Wt 140.0 lb

## 2016-07-19 DIAGNOSIS — I480 Paroxysmal atrial fibrillation: Secondary | ICD-10-CM

## 2016-07-19 DIAGNOSIS — I5022 Chronic systolic (congestive) heart failure: Secondary | ICD-10-CM | POA: Diagnosis not present

## 2016-07-19 LAB — CUP PACEART INCLINIC DEVICE CHECK
Battery Remaining Longevity: 77 mo
Battery Voltage: 2.98 V
Brady Statistic AP VP Percent: 71.93 %
Brady Statistic AP VS Percent: 0.84 %
Brady Statistic AS VP Percent: 26.87 %
Brady Statistic AS VS Percent: 0.36 %
Brady Statistic RA Percent Paced: 72.73 %
Brady Statistic RV Percent Paced: 78.1 %
Date Time Interrogation Session: 20180403100424
HighPow Impedance: 70 Ohm
Implantable Lead Implant Date: 20170328
Implantable Lead Implant Date: 20170328
Implantable Lead Implant Date: 20170328
Implantable Lead Location: 753858
Implantable Lead Location: 753859
Implantable Lead Location: 753860
Implantable Lead Model: 4598
Implantable Lead Model: 5076
Implantable Lead Model: 6935
Implantable Pulse Generator Implant Date: 20170328
Lead Channel Impedance Value: 342 Ohm
Lead Channel Impedance Value: 361 Ohm
Lead Channel Impedance Value: 361 Ohm
Lead Channel Impedance Value: 361 Ohm
Lead Channel Impedance Value: 475 Ohm
Lead Channel Impedance Value: 513 Ohm
Lead Channel Impedance Value: 513 Ohm
Lead Channel Impedance Value: 589 Ohm
Lead Channel Impedance Value: 646 Ohm
Lead Channel Impedance Value: 665 Ohm
Lead Channel Impedance Value: 817 Ohm
Lead Channel Impedance Value: 874 Ohm
Lead Channel Impedance Value: 893 Ohm
Lead Channel Pacing Threshold Amplitude: 0.75 V
Lead Channel Pacing Threshold Amplitude: 0.75 V
Lead Channel Pacing Threshold Amplitude: 0.75 V
Lead Channel Pacing Threshold Pulse Width: 0.4 ms
Lead Channel Pacing Threshold Pulse Width: 0.4 ms
Lead Channel Pacing Threshold Pulse Width: 0.4 ms
Lead Channel Sensing Intrinsic Amplitude: 0.25 mV
Lead Channel Sensing Intrinsic Amplitude: 1.625 mV
Lead Channel Sensing Intrinsic Amplitude: 11.25 mV
Lead Channel Sensing Intrinsic Amplitude: 12.75 mV
Lead Channel Setting Pacing Amplitude: 2 V
Lead Channel Setting Pacing Amplitude: 2.5 V
Lead Channel Setting Pacing Amplitude: 2.5 V
Lead Channel Setting Pacing Pulse Width: 0.4 ms
Lead Channel Setting Pacing Pulse Width: 0.4 ms
Lead Channel Setting Sensing Sensitivity: 0.3 mV

## 2016-07-19 NOTE — Patient Instructions (Signed)
Medication Instructions:  Your physician recommends that you continue on your current medications as directed. Please refer to the Current Medication list given to you today.   Labwork: none  Testing/Procedures: none  Follow-Up: Remote monitoring is used to monitor your Pacemaker or ICD from home. This monitoring reduces the number of office visits required to check your device to one time per year. It allows [place] to keep an eye on the functioning of your device to ensure it is working properly. You are scheduled for a device check from home on 10/18/2016. You may send your transmission at any time that day. If you have a wireless device, the transmission will be sent automatically. After your physician reviews your transmission, you will receive a postcard with your next transmission date.  Your physician wants you to follow-up in: 12 months with Dr. Lovena Le. You will receive a reminder letter in the mail two months in advance. If you don't receive a letter, please call our office to schedule the follow-up appointment.   Any Other Special Instructions Will Be Listed Below (If Applicable).     If you need a refill on your cardiac medications before your next appointment, please call your pharmacy.

## 2016-07-19 NOTE — Progress Notes (Signed)
HPI Mrs. Sackrider returns today for ongoing BiV ICD evaluation and management. She is a pleasant 81 yo woman with a h/o ovarian CA, s/p surgery and chemotherapy including adriamycin (1985), atrial fib well controlled with amiodarone, IVCD/LBBB with a QRS of 164, and dysynchrony on MRI and class 2 CHF. She has a h/o syncope, the last episode occuring approx 4 years ago. She remains active. She underwent BiV ICD insertion just over a year ago. In the interim, she has done well. No chest pain or sob. No edema. No ICD shocks. Allergies  Allergen Reactions  . Clindamycin/Lincomycin     itching  . Pneumococcal Vaccines Itching and Swelling    Redness   . Sulfonamide Derivatives Itching and Swelling     Current Outpatient Prescriptions  Medication Sig Dispense Refill  . amiodarone (PACERONE) 200 MG tablet Take 0.5 tablets (100 mg total) by mouth daily. 45 tablet 3  . atorvastatin (LIPITOR) 40 MG tablet TAKE 1 TABLET (40 MG TOTAL) BY MOUTH DAILY. 90 tablet 3  . busPIRone (BUSPAR) 5 MG tablet Take 1 tablet (5 mg total) by mouth daily as needed (anxiety). 30 tablet 3  . Coenzyme Q10 (COQ10) 100 MG CAPS Take 100 mg by mouth daily.     Marland Kitchen conjugated estrogens (PREMARIN) vaginal cream Place 1 Applicatorful vaginally as needed (dyspareunia (Use as directed)). Reported on 05/11/2015    . diclofenac sodium (VOLTAREN) 1 % GEL APPLY 2 GRAMS TOPICALLY 4TIMES DAILY 100 g 6  . furosemide (LASIX) 40 MG tablet TAKE 1 TABLET(40 MG) BY MOUTH TWICE DAILY 180 tablet 3  . ipratropium (ATROVENT) 0.03 % nasal spray Place 2 sprays into both nostrils 2 (two) times daily.  11  . losartan (COZAAR) 25 MG tablet Take 1 tablet (25 mg total) by mouth 2 (two) times daily. 60 tablet 3  . Magnesium Oxide 500 MG CAPS Take 2,000 mg by mouth daily.    . metoprolol succinate (TOPROL-XL) 25 MG 24 hr tablet Take 1.5 tablets (37.5 mg total) by mouth 2 (two) times daily. 270 tablet 3  . potassium chloride SA (K-DUR,KLOR-CON) 20 MEQ  tablet Take 1-2 tablets (20-40 mEq total) by mouth as directed. Take 2 tabs in the AM and 1 tab in the PM 90 tablet 3  . spironolactone (ALDACTONE) 25 MG tablet Take 1 tablet (25 mg total) by mouth every evening. 90 tablet 3  . traMADol (ULTRAM) 50 MG tablet Take 50 mg by mouth every 6 (six) hours as needed for moderate pain. Reported on 10/16/2015    . tretinoin (RETIN-A) 0.1 % cream Apply 1 application topically every morning.     . warfarin (COUMADIN) 2.5 MG tablet TAKE AS DIRECTED BY COUMADIN CLINIC 90 tablet 0  . zolpidem (AMBIEN) 5 MG tablet TAKE 1 TABLET BY MOUTH EVERY DAY AT BEDTIME AS NEEDED FOR SLEEP     No current facility-administered medications for this visit.      Past Medical History:  Diagnosis Date  . AICD (automatic cardioverter/defibrillator) present   . Arthritis    "left ankle" (07/14/2015)  . Atrial fibrillation ([place])   . CHF (congestive heart failure) ([place])   . Colon cancer ([place]) 03/2006   T3, N0  . Colon polyps 12/07/2010  . Coronary artery disease    nonobstructive with 30% D1  . GERD (gastroesophageal reflux disease)   . History of ovarian cancer 1985  . Hx: UTI (urinary tract infection)   . Hyperlipidemia   . Hypertension   .  Internal hemorrhoid   . Nonischemic cardiomyopathy ([place])    last EF assessment 40% by MUGA  . Partial bowel obstruction   . PVC (premature ventricular contraction)     ROS:   All systems reviewed and negative except as noted in the HPI.   Past Surgical History:  Procedure Laterality Date  . ABDOMINAL HYSTERECTOMY  1979  . ANKLE CLOSED REDUCTION  10/16/2011   Procedure: CLOSED REDUCTION ANKLE;  Surgeon: Tobi Bastos, MD;  Location: [facility];  Service: Orthopedics;  Laterality: Left;  . COLECTOMY  2007   for colon cancer  . COLONOSCOPY N/A 03/11/2013   Procedure: COLONOSCOPY;  Surgeon: Ladene Artist, MD;  Location: [facility];  Service: Endoscopy;  Laterality: N/A;  . COLONOSCOPY WITH PROPOFOL N/A 04/21/2015    Procedure: COLONOSCOPY WITH PROPOFOL;  Surgeon: Ladene Artist, MD;  Location: [facility];  Service: Endoscopy;  Laterality: N/A;  . EP IMPLANTABLE DEVICE N/A 07/14/2015   Procedure: BiV ICD Insertion CRT-D;  Surgeon: Evans Lance, MD;  Location: [facility];  Service: Cardiovascular;  Laterality: N/A;  . ESOPHAGOGASTRODUODENOSCOPY (EGD) WITH PROPOFOL N/A 04/21/2015   Procedure: ESOPHAGOGASTRODUODENOSCOPY (EGD) WITH PROPOFOL;  Surgeon: Ladene Artist, MD;  Location: [facility];  Service: Endoscopy;  Laterality: N/A;  . FRACTURE SURGERY    . INSERTION OF ICD Left 07/14/2015  . LAPAROTOMY  1980   "adhesions"  . LAPAROTOMY  1989   laparotomy for takedown of intestinal obstruction secondary to adhesions   . OOPHORECTOMY  1961  . OVARY SURGERY Right 1985   resection of right ovarian cancer  . SMALL INTESTINE SURGERY  1985  . TONSILLECTOMY  1944     Family History  Problem Relation Age of Onset  . Uterine cancer Mother   . Prostate cancer Father   . Heart disease Father   . Heart attack Father   . Colon cancer Neg Hx      Social History   Social History  . Marital status: Married    Spouse name: N/A  . Number of children: 2  . Years of education: N/A   Occupational History  . retired    Social History Main Topics  . Smoking status: Former Smoker    Packs/day: 0.50    Years: 20.00    Types: Cigarettes    Quit date: 04/18/1978  . Smokeless tobacco: Never Used  . Alcohol use 8.4 oz/week    14 Glasses of wine per week     Comment: has wine before dinner   . Drug use: No  . Sexual activity: Not Currently   Other Topics Concern  . Not on file   Social History Narrative   Patient had never smoked.   Alcohol use- yes   Daily caffeine use- coffee   Illicit drug use- no   Occupation:retired      BP 105/63   Pulse 61   Ht 5\' 5"  (1.651 m)   Wt 140 lb (63.5 kg)   SpO2 96%   BMI 23.30 kg/m   Physical Exam:  Well appearing 81 yo woman, NAD HEENT:  Unremarkable Neck:  6 cm JVD, no thyromegally Lymphatics:  No adenopathy Back:  No CVA tenderness Lungs:  Clear with no wheezes HEART:  Regular rate rhythm, no murmurs, no rubs, no clicks Abd:  soft, positive bowel sounds, no organomegally, no rebound, no guarding Ext:  2 plus pulses, no edema, no cyanosis, no clubbing Skin:  No rashes no nodules Neuro:  CN  II through XII intact, motor grossly intact  EKG - NSR with IVCD  Assess/Plan: 1. Atrial fibrillation - she is maintaining NSR on amiodarone 2. Chronic systolic heart failure - her symptoms are class 2. She will continue her current meds. 3. HTN - her blood pressure is well controlled. Will follow 4. ICD - her medtronic BiV ICD is working normally. P waves a little low.  We will recheck in several months.  Mikle Bosworth.D.

## 2016-08-04 ENCOUNTER — Ambulatory Visit (INDEPENDENT_AMBULATORY_CARE_PROVIDER_SITE_OTHER): Payer: Medicare Other | Admitting: *Deleted

## 2016-08-04 DIAGNOSIS — I4891 Unspecified atrial fibrillation: Secondary | ICD-10-CM | POA: Diagnosis not present

## 2016-08-04 LAB — POCT INR: INR: 2.3

## 2016-08-15 ENCOUNTER — Other Ambulatory Visit: Payer: Self-pay | Admitting: Cardiology

## 2016-08-29 ENCOUNTER — Encounter (HOSPITAL_COMMUNITY): Payer: Self-pay

## 2016-08-29 ENCOUNTER — Ambulatory Visit (HOSPITAL_COMMUNITY)
Admission: RE | Admit: 2016-08-29 | Discharge: 2016-08-29 | Disposition: A | Discharge status: Home / Self Care | Payer: Medicare Other | Source: Ambulatory Visit | Attending: Cardiology | Admitting: Cardiology

## 2016-08-29 VITALS — BP 107/67 | HR 66 | Ht 65.0 in | Wt 140.5 lb

## 2016-08-29 DIAGNOSIS — Z8543 Personal history of malignant neoplasm of ovary: Secondary | ICD-10-CM | POA: Diagnosis not present

## 2016-08-29 DIAGNOSIS — Z85038 Personal history of other malignant neoplasm of large intestine: Secondary | ICD-10-CM | POA: Diagnosis not present

## 2016-08-29 DIAGNOSIS — Z7901 Long term (current) use of anticoagulants: Secondary | ICD-10-CM | POA: Diagnosis not present

## 2016-08-29 DIAGNOSIS — E785 Hyperlipidemia, unspecified: Secondary | ICD-10-CM | POA: Insufficient documentation

## 2016-08-29 DIAGNOSIS — I5022 Chronic systolic (congestive) heart failure: Secondary | ICD-10-CM | POA: Diagnosis present

## 2016-08-29 DIAGNOSIS — R7989 Other specified abnormal findings of blood chemistry: Secondary | ICD-10-CM | POA: Diagnosis not present

## 2016-08-29 DIAGNOSIS — I429 Cardiomyopathy, unspecified: Secondary | ICD-10-CM | POA: Insufficient documentation

## 2016-08-29 DIAGNOSIS — I480 Paroxysmal atrial fibrillation: Secondary | ICD-10-CM | POA: Diagnosis not present

## 2016-08-29 DIAGNOSIS — Z87891 Personal history of nicotine dependence: Secondary | ICD-10-CM | POA: Insufficient documentation

## 2016-08-29 DIAGNOSIS — K219 Gastro-esophageal reflux disease without esophagitis: Secondary | ICD-10-CM | POA: Insufficient documentation

## 2016-08-29 DIAGNOSIS — I110 Hypertensive heart disease with heart failure: Secondary | ICD-10-CM | POA: Diagnosis not present

## 2016-08-29 LAB — COMPREHENSIVE METABOLIC PANEL
ALT: 26 U/L (ref 14–54)
AST: 35 U/L (ref 15–41)
Albumin: 4.3 g/dL (ref 3.5–5.0)
Alkaline Phosphatase: 68 U/L (ref 38–126)
Anion gap: 12 (ref 5–15)
BUN: 24 mg/dL — ABNORMAL HIGH (ref 6–20)
CO2: 23 mmol/L (ref 22–32)
Calcium: 9.3 mg/dL (ref 8.9–10.3)
Chloride: 100 mmol/L — ABNORMAL LOW (ref 101–111)
Creatinine, Ser: 1.41 mg/dL — ABNORMAL HIGH (ref 0.44–1.00)
GFR calc Af Amer: 40 mL/min — ABNORMAL LOW (ref >60)
GFR calc non Af Amer: 35 mL/min — ABNORMAL LOW (ref >60)
Glucose, Bld: 105 mg/dL — ABNORMAL HIGH (ref 65–99)
Potassium: 4.1 mmol/L (ref 3.5–5.1)
Sodium: 135 mmol/L (ref 135–145)
Total Bilirubin: 0.6 mg/dL (ref 0.3–1.2)
Total Protein: 7 g/dL (ref 6.5–8.1)

## 2016-08-29 LAB — TSH: TSH: 7.544 u[IU]/mL — ABNORMAL HIGH (ref 0.350–4.500)

## 2016-08-29 MED ORDER — SACUBITRIL-VALSARTAN 24-26 MG PO TABS: 1.0000 | ORAL_TABLET | Freq: Two times a day (BID) | ORAL | 3 refills | Status: DC

## 2016-08-29 MED ORDER — FUROSEMIDE 40 MG PO TABS: ORAL_TABLET | ORAL | 3 refills | Status: DC

## 2016-08-29 MED ORDER — POTASSIUM CHLORIDE CRYS ER 20 MEQ PO TBCR: EXTENDED_RELEASE_TABLET | ORAL | 6 refills | Status: DC

## 2016-08-29 MED ORDER — POTASSIUM CHLORIDE CRYS ER 20 MEQ PO TBCR: 40.0000 meq | EXTENDED_RELEASE_TABLET | Freq: Every day | ORAL | 6 refills | Status: DC

## 2016-08-29 NOTE — Progress Notes (Signed)
Patient ID: Cynthia Frank, female   DOB: [birth year], 81 y.o.   MRN: [id number]    Advanced Heart Failure Clinic Note   PCP: Dr. Sharlet Salina HF Cardiology: Dr. Aundra Dubin  81 yo with history of chronic systolic CHF/nonischemic cardiomyopathy, paroxysmal atrial fibrillation, and prior ovarian and colon cancers presents for CHF followup. She has had a cardiomyopathy known for > 15 years now. Cardiac cath at diagnosis showed mild nonobstructive disease.  EF has been persistently low.  Cause has been thought to be Adriamycin; she received this as part of her ovarian cancer treatment. She has had paroxysmal atrial fibrillation and has remained in NSR with amiodarone use.  No recent atrial fibrillation symptoms, typically feels prolonged palpitations thought to be atrial fibrillation 2-3 times/year.   She had had dyspnea with moderate to heavy exertion for 4-5 years prior to initial appt.  However, just before her initial appt, her symptoms had worsened.  She developed shortness of breath walking short distances on flat ground.  Lasix was increased to 40 mg bid and she lost weight and felt better.     Cardiac MRI was done in 1/17, showing EF 29% with normal RV.  Septal-lateral dyssynchrony was present and there was a non-coronary pattern of LGE in the septum.  LFTs were noted to be mildly elevated.  Amiodarone was decreased to 100 mg daily.   She had Medtronic CRT-D placed in 3/17. Repeat echo 8/17 showed EF 35% with diffuse hypokinesis, normal RV.   She presents today for regular followup. Overall feeling good, thinks better since CRT.  Rare lightheadedness with fast standing from lying down.  No significant exertional dyspnea.  No chest pain.  No orthopnea/PND.  No palpitations.  Weight is stable.    Optivol: Fluid index < threshold but mild upward trend, impedance starting to increase.  Short runs of atrial fibrillation in 3/18, nothing since then.   Labs (1/17): K 4.2, creatinine 1.14, BNP 855 Labs  (3/17): SPEP negative, BNP 341, K 4.5, creatinine 1.27, AST 62, ALT 74, TSH normal with low free T3 and normal free T4, HCT 46.5 Labs (4/17): AST 64, ALT 67, BNP 278, K 3.6, creatinine 1.21 Labs (5/17): K 3.4, creatinine 1.39, AST 53, ALT 61, HBV/HCV negative, TSH mildly elevated, BNP 306 Labs (6/17): K 4.4, creatinine 1.36, free T4 normal, free T3 mildly decreased, AST 50, ALT 53. Labs (7/17): ANA negative, ASMA positive Labs (8/17): AST 45, ALT 37, K 3.8, creatinine 1.19, TSH normal Labs (2/18): TSH normal, LDL 47, LFTs, normal Labs (3/18): K normal, creatinine 1.23.   PMH: 1. Chronic systolic CHF: Nonischemic cardiomyopathy, thought to be related to adriamycin that she had for treatment of ovarian cancer.  NICM diagnosed ~ 2001.  - LHC ~ 2001 with 30% stenosis D1.   - Echo (2013) EF 25-30%.  - Echo (10/14) with EF 25-30%. - Echo (12/16) with EF 20-25%, mild LV dilation, restrictive diastolic function, moderate MR, severe LAE. - Cardiac MRI (1/17): Moderately dilated LV with EF 29%. Diffuse hypokinesis looking worse in the septal wall. Septal-lateral dyssynchrony. Normal RV size and systolic function.  Moderate MR with tethered posterior leaflet.  Mid-wall LGE in the basal to mid septum. This is not a coronary disease pattern. Could be suggestive of prior myocarditis. - Medtronic CRT-D placed 3/17.  - Echo (8/17): EF 35%, diffuse hypokinesis, normal RV size and systolic function, moderate MR.  2. Atrial fibrillation: Paroxysmal.  3. HTN 4. Ovarian cancer: 1985, s/p surgery. Received Adriamycin-based chemotherapy.  5. Colon cancer: 2007, s/p surgery.  6. PVCs 7. GERD 8. PFTs (8/16) without significant abnormality. 9. Prior syncope 10. Elevated transaminases: Uncertain etiology.   SH: Married, lives in [place], prior smoker.   FH: Father with MI.   ROS: All systems reviewed and negative except as per HPI.   Current Outpatient Prescriptions  Medication Sig Dispense Refill  .  amiodarone (PACERONE) 200 MG tablet Take 0.5 tablets (100 mg total) by mouth daily. 45 tablet 3  . atorvastatin (LIPITOR) 40 MG tablet TAKE 1 TABLET (40 MG TOTAL) BY MOUTH DAILY. 90 tablet 3  . busPIRone (BUSPAR) 5 MG tablet Take 1 tablet (5 mg total) by mouth daily as needed (anxiety). 30 tablet 3  . Coenzyme Q10 (COQ10) 100 MG CAPS Take 100 mg by mouth daily.     Marland Kitchen conjugated estrogens (PREMARIN) vaginal cream Place 1 Applicatorful vaginally as needed (dyspareunia (Use as directed)). Reported on 05/11/2015    . diclofenac sodium (VOLTAREN) 1 % GEL APPLY 2 GRAMS TOPICALLY 4TIMES DAILY 100 g 6  . furosemide (LASIX) 40 MG tablet Take 1 tab in AM and 1/2 tab in PM 45 tablet 3  . ipratropium (ATROVENT) 0.03 % nasal spray Place 2 sprays into both nostrils 2 (two) times daily.  11  . Magnesium Oxide 500 MG CAPS Take 2,000 mg by mouth daily.    . metoprolol succinate (TOPROL-XL) 25 MG 24 hr tablet Take 1.5 tablets (37.5 mg total) by mouth 2 (two) times daily. 270 tablet 3  . potassium chloride SA (K-DUR,KLOR-CON) 20 MEQ tablet Take 2 tablets (40 mEq total) by mouth daily. 60 tablet 6  . spironolactone (ALDACTONE) 25 MG tablet Take 1 tablet (25 mg total) by mouth every evening. 90 tablet 3  . traMADol (ULTRAM) 50 MG tablet Take 50 mg by mouth every 6 (six) hours as needed for moderate pain. Reported on 10/16/2015    . tretinoin (RETIN-A) 0.1 % cream Apply 1 application topically every morning.     . warfarin (COUMADIN) 2.5 MG tablet TAKE AS DIRECTED BY COUMADIN CLINIC 70 tablet 1  . zolpidem (AMBIEN) 5 MG tablet TAKE 1 TABLET BY MOUTH EVERY DAY AT BEDTIME AS NEEDED FOR SLEEP    . sacubitril-valsartan (ENTRESTO) 24-26 MG Take 1 tablet by mouth 2 (two) times daily. 60 tablet 3   No current facility-administered medications for this encounter.    BP 107/67   Pulse 66   Ht 5\' 5"  (1.651 m)   Wt 140 lb 8 oz (63.7 kg)   SpO2 99%   BMI 23.38 kg/m    Wt Readings from Last 3 Encounters:  08/29/16 140 lb 8  oz (63.7 kg)  07/19/16 140 lb (63.5 kg)  05/31/16 139 lb 4 oz (63.2 kg)    General: NAD Neck: JVP 7 cm, no thyromegaly or thyroid nodule.  Lungs: Clear to auscultation bilaterally CV: Nondisplaced PMI.  Heart regular S1/S2, no S3/S4, no murmur.  No peripheral edema.  No carotid bruit.  Normal pedal pulses.  Abdomen: Soft, NT, ND, no HSM. No bruits or masses. +BS   Skin: Intact without lesions or rashes.  Neurologic: Alert and oriented x 3.  Psych: Normal affect. Extremities: No clubbing or cyanosis.  HEENT: Normal.   Assessment/Plan: 1. Chronic systolic CHF: EF 14-43% with diffuse hypokinesis on 12/16 echo.  Nonischemic cardiomyopathy, probably related to Adriamycin with ovarian cancer treatment.  Cardiac MRI in 1/17 showed EF 29% with normal RV and septal-lateral dyssynchrony.  There was a non-cardiac  pattern of LGE in the septum, cannot rule out prior myocarditis based on this pattern.  She has a Medtronic CRT-D device.  Echo 8/17 with EF 35%.  NYHA class II symptoms currently.  She looks euvolemic on exam.    - Continue Toprol XL 37.5 mg bid.   - Continue spironolactone 25 daily. BMET today.  - Stop losartan and start Entresto 24/26 bid.  BMET in 10 days.    - With addition of Entresto, I will decrease Lasix to 40 qam/20 qpm and will decrease KCl to 40 daily.  2. Atrial fibrillation: Paroxysmal.  Maintaining NSR with amiodarone.  - Continue amiodarone at 100 mg daily. Check LFTs and TSH, normal on last check.  She will need periodic eye exam as well.  - Continue warfarin.  3. Elevated LFTs: This may be due to CHF versus amiodarone.  Amiodarone decreased to 100 mg daily.  Abdominal US did not show liver abnormalities or gallstones, CT abdomen showed normal liver in 7/17.  HCV/HBV serologies negative, ANA negative, ASMA positive but not thought to be significant.  LFTs down to normal in 3/18.   4. Hyperlipidemia: Good lipids in 2/18.    Followup in 2 months  [name] 08/29/2016

## 2016-08-29 NOTE — Patient Instructions (Addendum)
Decrease Furosemide (lasix) to 40 mg in Am and 20 meq (1 tab) in PM  Decrease Potassium to 40 meq (2 tabs) daily  Stop Losartan   Start Entresto 24/26 mg Twice daily, If this cost too much money please call Doroteo Bradford our pharmacist at [phone]  Labs today  Labs in 10 days  Your physician recommends that you schedule a follow-up appointment in: 2 months

## 2016-08-30 ENCOUNTER — Telehealth (HOSPITAL_COMMUNITY): Payer: Self-pay | Admitting: *Deleted

## 2016-08-30 DIAGNOSIS — R7989 Other specified abnormal findings of blood chemistry: Secondary | ICD-10-CM

## 2016-08-30 NOTE — Telephone Encounter (Signed)
-----   Message from Larey Dresser, MD sent at 08/29/2016  4:53 PM EDT ----- LFTs remain normal.  TSH mildly elevated again.  With repeat labs for Entresto, would add on TSH, free T3, and free T4.

## 2016-09-08 ENCOUNTER — Ambulatory Visit (HOSPITAL_COMMUNITY)
Admission: RE | Admit: 2016-09-08 | Discharge: 2016-09-08 | Disposition: A | Discharge status: Home / Self Care | Payer: Medicare Other | Source: Ambulatory Visit | Attending: Internal Medicine | Admitting: Internal Medicine

## 2016-09-08 DIAGNOSIS — R946 Abnormal results of thyroid function studies: Secondary | ICD-10-CM | POA: Diagnosis not present

## 2016-09-08 DIAGNOSIS — R7989 Other specified abnormal findings of blood chemistry: Secondary | ICD-10-CM

## 2016-09-08 LAB — TSH: TSH: 4.101 u[IU]/mL (ref 0.350–4.500)

## 2016-09-08 LAB — T4, FREE: Free T4: 0.86 ng/dL (ref 0.61–1.12)

## 2016-09-09 LAB — T3, FREE: T3, Free: 2 pg/mL (ref 2.0–4.4)

## 2016-09-13 ENCOUNTER — Other Ambulatory Visit: Payer: Self-pay | Admitting: Internal Medicine

## 2016-09-15 ENCOUNTER — Ambulatory Visit (INDEPENDENT_AMBULATORY_CARE_PROVIDER_SITE_OTHER): Payer: Medicare Other

## 2016-09-15 DIAGNOSIS — I4891 Unspecified atrial fibrillation: Secondary | ICD-10-CM | POA: Diagnosis not present

## 2016-09-15 LAB — POCT INR: INR: 2

## 2016-10-07 ENCOUNTER — Other Ambulatory Visit: Payer: Self-pay | Admitting: Internal Medicine

## 2016-10-07 NOTE — Telephone Encounter (Signed)
Routing to dr Jenny Reichmann, please advise in dr crawford's absence, thanks

## 2016-10-07 NOTE — Telephone Encounter (Signed)
Faxed

## 2016-10-07 NOTE — Telephone Encounter (Signed)
Done hardcopy to Shirron  

## 2016-10-14 DIAGNOSIS — Z803 Family history of malignant neoplasm of breast: Secondary | ICD-10-CM | POA: Diagnosis not present

## 2016-10-14 DIAGNOSIS — Z1231 Encounter for screening mammogram for malignant neoplasm of breast: Secondary | ICD-10-CM | POA: Diagnosis not present

## 2016-10-14 LAB — HM MAMMOGRAPHY

## 2016-10-18 ENCOUNTER — Encounter: Payer: Self-pay | Admitting: Internal Medicine

## 2016-10-18 ENCOUNTER — Ambulatory Visit (INDEPENDENT_AMBULATORY_CARE_PROVIDER_SITE_OTHER): Payer: Medicare Other | Admitting: *Deleted

## 2016-10-18 DIAGNOSIS — I428 Other cardiomyopathies: Secondary | ICD-10-CM

## 2016-10-18 NOTE — Progress Notes (Signed)
Remote ICD transmission.   

## 2016-10-18 NOTE — Progress Notes (Unsigned)
Results entered and sent to scan  

## 2016-10-20 LAB — CUP PACEART REMOTE DEVICE CHECK
Battery Remaining Longevity: 77 mo
Battery Voltage: 2.98 V
Brady Statistic AP VP Percent: 63.54 %
Brady Statistic AP VS Percent: 0.82 %
Brady Statistic AS VP Percent: 34.88 %
Brady Statistic AS VS Percent: 0.75 %
Brady Statistic RA Percent Paced: 64.19 %
Brady Statistic RV Percent Paced: 69.4 %
Date Time Interrogation Session: 20180702132618
HighPow Impedance: 75 Ohm
Implantable Lead Implant Date: 20170328
Implantable Lead Implant Date: 20170328
Implantable Lead Implant Date: 20170328
Implantable Lead Location: 753858
Implantable Lead Location: 753859
Implantable Lead Location: 753860
Implantable Lead Model: 4598
Implantable Lead Model: 5076
Implantable Lead Model: 6935
Implantable Pulse Generator Implant Date: 20170328
Lead Channel Impedance Value: 304 Ohm
Lead Channel Impedance Value: 361 Ohm
Lead Channel Impedance Value: 418 Ohm
Lead Channel Impedance Value: 418 Ohm
Lead Channel Impedance Value: 475 Ohm
Lead Channel Impedance Value: 532 Ohm
Lead Channel Impedance Value: 532 Ohm
Lead Channel Impedance Value: 551 Ohm
Lead Channel Impedance Value: 608 Ohm
Lead Channel Impedance Value: 608 Ohm
Lead Channel Impedance Value: 836 Ohm
Lead Channel Impedance Value: 836 Ohm
Lead Channel Impedance Value: 931 Ohm
Lead Channel Pacing Threshold Amplitude: 0.75 V
Lead Channel Pacing Threshold Amplitude: 0.75 V
Lead Channel Pacing Threshold Amplitude: 0.75 V
Lead Channel Pacing Threshold Pulse Width: 0.4 ms
Lead Channel Pacing Threshold Pulse Width: 0.4 ms
Lead Channel Pacing Threshold Pulse Width: 0.4 ms
Lead Channel Sensing Intrinsic Amplitude: 0.75 mV
Lead Channel Sensing Intrinsic Amplitude: 0.75 mV
Lead Channel Sensing Intrinsic Amplitude: 12.375 mV
Lead Channel Sensing Intrinsic Amplitude: 12.375 mV
Lead Channel Setting Pacing Amplitude: 2 V
Lead Channel Setting Pacing Amplitude: 2.25 V
Lead Channel Setting Pacing Amplitude: 2.5 V
Lead Channel Setting Pacing Pulse Width: 0.4 ms
Lead Channel Setting Pacing Pulse Width: 0.4 ms
Lead Channel Setting Sensing Sensitivity: 0.3 mV

## 2016-10-21 ENCOUNTER — Encounter: Payer: Self-pay | Admitting: Cardiology

## 2016-10-27 ENCOUNTER — Ambulatory Visit (HOSPITAL_COMMUNITY)
Admission: RE | Admit: 2016-10-27 | Discharge: 2016-10-27 | Disposition: A | Discharge status: Home / Self Care | Payer: Medicare Other | Source: Ambulatory Visit | Attending: Internal Medicine | Admitting: Internal Medicine

## 2016-10-27 ENCOUNTER — Ambulatory Visit (INDEPENDENT_AMBULATORY_CARE_PROVIDER_SITE_OTHER): Payer: Medicare Other | Admitting: *Deleted

## 2016-10-27 VITALS — BP 96/64 | HR 61 | Wt 142.6 lb

## 2016-10-27 DIAGNOSIS — R7989 Other specified abnormal findings of blood chemistry: Secondary | ICD-10-CM | POA: Diagnosis not present

## 2016-10-27 DIAGNOSIS — I480 Paroxysmal atrial fibrillation: Secondary | ICD-10-CM | POA: Diagnosis not present

## 2016-10-27 DIAGNOSIS — E7849 Other hyperlipidemia: Secondary | ICD-10-CM

## 2016-10-27 DIAGNOSIS — Z8543 Personal history of malignant neoplasm of ovary: Secondary | ICD-10-CM | POA: Diagnosis not present

## 2016-10-27 DIAGNOSIS — I428 Other cardiomyopathies: Secondary | ICD-10-CM | POA: Insufficient documentation

## 2016-10-27 DIAGNOSIS — I5022 Chronic systolic (congestive) heart failure: Secondary | ICD-10-CM | POA: Insufficient documentation

## 2016-10-27 DIAGNOSIS — I4891 Unspecified atrial fibrillation: Secondary | ICD-10-CM | POA: Diagnosis not present

## 2016-10-27 DIAGNOSIS — I110 Hypertensive heart disease with heart failure: Secondary | ICD-10-CM | POA: Insufficient documentation

## 2016-10-27 DIAGNOSIS — R945 Abnormal results of liver function studies: Secondary | ICD-10-CM | POA: Diagnosis not present

## 2016-10-27 DIAGNOSIS — R2689 Other abnormalities of gait and mobility: Secondary | ICD-10-CM | POA: Insufficient documentation

## 2016-10-27 DIAGNOSIS — Z87891 Personal history of nicotine dependence: Secondary | ICD-10-CM | POA: Insufficient documentation

## 2016-10-27 DIAGNOSIS — E784 Other hyperlipidemia: Secondary | ICD-10-CM

## 2016-10-27 DIAGNOSIS — K219 Gastro-esophageal reflux disease without esophagitis: Secondary | ICD-10-CM | POA: Insufficient documentation

## 2016-10-27 DIAGNOSIS — E785 Hyperlipidemia, unspecified: Secondary | ICD-10-CM | POA: Insufficient documentation

## 2016-10-27 DIAGNOSIS — Z7901 Long term (current) use of anticoagulants: Secondary | ICD-10-CM | POA: Insufficient documentation

## 2016-10-27 DIAGNOSIS — R740 Nonspecific elevation of levels of transaminase and lactic acid dehydrogenase [LDH]: Secondary | ICD-10-CM | POA: Insufficient documentation

## 2016-10-27 DIAGNOSIS — Z85038 Personal history of other malignant neoplasm of large intestine: Secondary | ICD-10-CM | POA: Insufficient documentation

## 2016-10-27 DIAGNOSIS — Z79899 Other long term (current) drug therapy: Secondary | ICD-10-CM | POA: Diagnosis not present

## 2016-10-27 DIAGNOSIS — I493 Ventricular premature depolarization: Secondary | ICD-10-CM | POA: Insufficient documentation

## 2016-10-27 LAB — COMPREHENSIVE METABOLIC PANEL
ALT: 20 U/L (ref 14–54)
AST: 31 U/L (ref 15–41)
Albumin: 4 g/dL (ref 3.5–5.0)
Alkaline Phosphatase: 73 U/L (ref 38–126)
Anion gap: 10 (ref 5–15)
BUN: 19 mg/dL (ref 6–20)
CO2: 26 mmol/L (ref 22–32)
Calcium: 8.8 mg/dL — ABNORMAL LOW (ref 8.9–10.3)
Chloride: 97 mmol/L — ABNORMAL LOW (ref 101–111)
Creatinine, Ser: 1.32 mg/dL — ABNORMAL HIGH (ref 0.44–1.00)
GFR calc Af Amer: 44 mL/min — ABNORMAL LOW (ref >60)
GFR calc non Af Amer: 38 mL/min — ABNORMAL LOW (ref >60)
Glucose, Bld: 99 mg/dL (ref 65–99)
Potassium: 4 mmol/L (ref 3.5–5.1)
Sodium: 133 mmol/L — ABNORMAL LOW (ref 135–145)
Total Bilirubin: 0.8 mg/dL (ref 0.3–1.2)
Total Protein: 6.9 g/dL (ref 6.5–8.1)

## 2016-10-27 LAB — CBC
HCT: 39.6 % (ref 36.0–46.0)
Hemoglobin: 13.2 g/dL (ref 12.0–15.0)
MCH: 30.8 pg (ref 26.0–34.0)
MCHC: 33.3 g/dL (ref 30.0–36.0)
MCV: 92.5 fL (ref 78.0–100.0)
Platelets: 137 10*3/uL — ABNORMAL LOW (ref 150–400)
RBC: 4.28 MIL/uL (ref 3.87–5.11)
RDW: 13.2 % (ref 11.5–15.5)
WBC: 5.7 10*3/uL (ref 4.0–10.5)

## 2016-10-27 LAB — MAGNESIUM: Magnesium: 1.7 mg/dL (ref 1.7–2.4)

## 2016-10-27 LAB — POCT INR: INR: 2.3

## 2016-10-27 NOTE — Patient Instructions (Signed)
Routine lab work today. Will notify you of abnormal results, otherwise no news is good news!  No changes to medication at this time.  Follow up 3 months with Dr. Aundra Dubin. We will call you closer to this time, or you may call our office to schedule 1 month before you are due to be seen. Take all medication as prescribed the day of your appointment. Bring all medications with you to your appointment.  Do the following things EVERYDAY: 1) Weigh yourself in the morning before breakfast. Write it down and keep it in a log. 2) Take your medicines as prescribed 3) Eat low salt foods-Limit salt (sodium) to 2000 mg per day.  4) Stay as active as you can everyday 5) Limit all fluids for the day to less than 2 liters

## 2016-10-27 NOTE — Progress Notes (Addendum)
Patient ID: Cynthia Frank, female   DOB: Aug 13, 1935, 81 y.o.   MRN: [id number]    Advanced Heart Failure Clinic Note   PCP: Dr. Sharlet Salina HF Cardiology: Dr. Aundra Dubin  81 yo with history of chronic systolic CHF/nonischemic cardiomyopathy, paroxysmal atrial fibrillation, and prior ovarian and colon cancers presents for CHF followup. She has had a cardiomyopathy known for > 15 years now. Cardiac cath at diagnosis showed mild nonobstructive disease.  EF has been persistently low.  Cause has been thought to be Adriamycin; she received this as part of her ovarian cancer treatment. She has had paroxysmal atrial fibrillation and has remained in NSR with amiodarone use.  No recent atrial fibrillation symptoms, typically feels prolonged palpitations thought to be atrial fibrillation 2-3 times/year.   She had had dyspnea with moderate to heavy exertion for 4-5 years prior to initial appt.  However, just before her initial appt, her symptoms had worsened.  She developed shortness of breath walking short distances on flat ground.  Lasix was increased to 40 mg bid and she lost weight and felt better.     Cardiac MRI was done in 1/17, showing EF 29% with normal RV.  Septal-lateral dyssynchrony was present and there was a non-coronary pattern of LGE in the septum.  LFTs were noted to be mildly elevated.  Amiodarone was decreased to 100 mg daily.   She had Medtronic CRT-D placed in 3/17. Repeat echo 8/17 showed EF 35% with diffuse hypokinesis, normal RV.   She presents today for regular follow up. At last visit started on Entresto. Feels better on it.  Mild SOB with climbing steps, but does not have to stop. Occasional lightheadedness with rapid standing but not marked or limiting. No significant DOE. No chest pain or orthopnea. Denies palpitations. Weight stable at home. Trying to watch salt and fluid. Has trouble with her balance at times, chronic since she broke her ankle several years ago. Feels like she may have  had a gout flare several weeks ago (though no history of gout). Had redness and swelling in her R MTP joint, with no obvious trauma. Resolved within 24 hours.   Optivol: Fluid index below threshold, no recent crossings. Thoracic impedence above threshold. Pt activity around 1-2 hrs daily. 2 episodes of AF last month.   Labs (1/17): K 4.2, creatinine 1.14, BNP 855 Labs (3/17): SPEP negative, BNP 341, K 4.5, creatinine 1.27, AST 62, ALT 74, TSH normal with low free T3 and normal free T4, HCT 46.5 Labs (4/17): AST 64, ALT 67, BNP 278, K 3.6, creatinine 1.21 Labs (5/17): K 3.4, creatinine 1.39, AST 53, ALT 61, HBV/HCV negative, TSH mildly elevated, BNP 306 Labs (6/17): K 4.4, creatinine 1.36, free T4 normal, free T3 mildly decreased, AST 50, ALT 53. Labs (7/17): ANA negative, ASMA positive Labs (8/17): AST 45, ALT 37, K 3.8, creatinine 1.19, TSH normal Labs (2/18): TSH normal, LDL 47, LFTs, normal Labs (3/18): K normal, creatinine 1.23.   PMH: 1. Chronic systolic CHF: Nonischemic cardiomyopathy, thought to be related to adriamycin that she had for treatment of ovarian cancer.  NICM diagnosed ~ 2001.  - LHC ~ 2001 with 30% stenosis D1.   - Echo (2013) EF 25-30%.  - Echo (10/14) with EF 25-30%. - Echo (12/16) with EF 20-25%, mild LV dilation, restrictive diastolic function, moderate MR, severe LAE. - Cardiac MRI (1/17): Moderately dilated LV with EF 29%. Diffuse hypokinesis looking worse in the septal wall. Septal-lateral dyssynchrony. Normal RV size and systolic function.  Moderate MR with tethered posterior leaflet.  Mid-wall LGE in the basal to mid septum. This is not a coronary disease pattern. Could be suggestive of prior myocarditis. - Medtronic CRT-D placed 3/17.  - Echo (8/17): EF 35%, diffuse hypokinesis, normal RV size and systolic function, moderate MR.  2. Atrial fibrillation: Paroxysmal.  3. HTN 4. Ovarian cancer: 1985, s/p surgery. Received Adriamycin-based chemotherapy.  5. Colon  cancer: 2007, s/p surgery.  6. PVCs 7. GERD 8. PFTs (8/16) without significant abnormality. 9. Prior syncope 10. Elevated transaminases: Uncertain etiology.   SH: Married, lives in [place], prior smoker.   FH: Father with MI.   Review of systems complete and found to be negative unless listed in HPI.    Current Outpatient Prescriptions  Medication Sig Dispense Refill  . amiodarone (PACERONE) 200 MG tablet Take 0.5 tablets (100 mg total) by mouth daily. 45 tablet 3  . atorvastatin (LIPITOR) 40 MG tablet TAKE 1 TABLET (40 MG TOTAL) BY MOUTH DAILY. 90 tablet 3  . busPIRone (BUSPAR) 5 MG tablet Take 1 tablet (5 mg total) by mouth daily as needed (anxiety). 30 tablet 3  . Coenzyme Q10 (COQ10) 100 MG CAPS Take 100 mg by mouth daily.     Marland Kitchen conjugated estrogens (PREMARIN) vaginal cream Place 1 Applicatorful vaginally as needed (dyspareunia (Use as directed)). Reported on 05/11/2015    . diclofenac sodium (VOLTAREN) 1 % GEL APPLY 2 GRAMS TOPICALLY 4TIMES DAILY 100 g 6  . furosemide (LASIX) 40 MG tablet Take 1 tab in AM and 1/2 tab in PM 45 tablet 3  . ipratropium (ATROVENT) 0.03 % nasal spray Place 2 sprays into both nostrils 2 (two) times daily.  11  . Magnesium Oxide 500 MG CAPS Take 2,000 mg by mouth daily.    . metoprolol succinate (TOPROL-XL) 25 MG 24 hr tablet Take 1.5 tablets (37.5 mg total) by mouth 2 (two) times daily. 270 tablet 3  . potassium chloride SA (K-DUR,KLOR-CON) 20 MEQ tablet Take 2 tablets (40 mEq total) by mouth daily. 60 tablet 6  . sacubitril-valsartan (ENTRESTO) 24-26 MG Take 1 tablet by mouth 2 (two) times daily. 60 tablet 3  . spironolactone (ALDACTONE) 25 MG tablet Take 1 tablet (25 mg total) by mouth every evening. 90 tablet 3  . traMADol (ULTRAM) 50 MG tablet Take 50 mg by mouth every 6 (six) hours as needed for moderate pain. Reported on 10/16/2015    . tretinoin (RETIN-A) 0.1 % cream Apply 1 application topically every morning.     . warfarin (COUMADIN) 2.5 MG  tablet TAKE AS DIRECTED BY COUMADIN CLINIC 70 tablet 1  . zolpidem (AMBIEN) 5 MG tablet TAKE 1 TABLET BY MOUTH EVERY DAY AT BEDTIME AS NEEDED 30 tablet 2   No current facility-administered medications for this encounter.    Vitals:   10/27/16 0941  BP: 96/64  Pulse: 61  SpO2: 95%  Weight: 142 lb 9.6 oz (64.7 kg)   Wt Readings from Last 3 Encounters:  10/27/16 142 lb 9.6 oz (64.7 kg)  08/29/16 140 lb 8 oz (63.7 kg)  07/19/16 140 lb (63.5 kg)   General: Well appearing. No resp difficulty. HEENT: Normal Neck: Supple. JVP 7-8. Carotids 2+ bilat; no bruits. No thyromegaly or nodule noted. Cor: PMI nondisplaced. RRR, No M/G/R noted Lungs: CTAB, normal effort. Abdomen: Soft, non-tender, non-distended, no HSM. No bruits or masses. +BS  Extremities: No cyanosis, clubbing, rash, R and LLE no edema.  Neuro: Alert & orientedx3, cranial nerves grossly intact. moves  all 4 extremities w/o difficulty. Affect pleasant   Assessment/Plan: 1. Chronic systolic CHF: EF 56-31% with diffuse hypokinesis on 12/16 echo.  Nonischemic cardiomyopathy, probably related to Adriamycin with ovarian cancer treatment.  Cardiac MRI in 1/17 showed EF 29% with normal RV and septal-lateral dyssynchrony.  There was a non-cardiac pattern of LGE in the septum, cannot rule out prior myocarditis based on this pattern.  She has a Medtronic CRT-D device.  Echo 8/17 with EF 35%.  - NYHA class II symptoms - Volume status stable on exam. - Continue lasix 40 mg q am and 20 mg q pm. BMET today.  - Continue KCL 40 daily - Continue Toprol XL 37.5 mg bid.   - Continue spironolactone 25 daily. BMET today.  - Continue Entresto 24/26 mg BID. No room to up-titrate with soft pressures.  2. Atrial fibrillation: Paroxysmal.  Maintaining NSR with amiodarone.  - Continue amiodarone at 100 mg daily. CMET today.  - Needs yearly eye exams. - Continue warfarin. INR stable. No bleeding.  3. Elevated LFTs: This may be due to CHF versus  amiodarone.  Amiodarone decreased to 100 mg daily.  CMET today.  - Abdominal US did not show liver abnormalities or gallstones, CT abdomen showed normal liver in 7/17.  HCV/HBV serologies negative, ANA negative, ASMA positive but not thought to be significant.  LFTs down to normal in 3/18.   4. Hyperlipidemia: Good lipids in 2/18. No change.  5. Imbalance - Broke her ankle several years ago and has had trouble with balance, including several falls without injury. - She would like to try PT. We will refer.    No room to up meds with soft pressures. Labs today. RTC 3 months with Echo.   Satira Mccallum [facility] 10/27/2016

## 2016-10-27 NOTE — Addendum Note (Signed)
Encounter addended by: Shirley Friar, PA-C on: 10/27/2016 10:19 AM<BR>    Actions taken: Visit diagnoses modified, Sign clinical note

## 2016-11-07 ENCOUNTER — Other Ambulatory Visit: Payer: Self-pay

## 2016-11-08 ENCOUNTER — Encounter: Payer: Self-pay | Admitting: Cardiology

## 2016-11-15 ENCOUNTER — Other Ambulatory Visit: Payer: Self-pay | Admitting: Cardiology

## 2016-11-29 ENCOUNTER — Ambulatory Visit (INDEPENDENT_AMBULATORY_CARE_PROVIDER_SITE_OTHER): Payer: Medicare Other | Admitting: *Deleted

## 2016-11-29 DIAGNOSIS — I480 Paroxysmal atrial fibrillation: Secondary | ICD-10-CM

## 2016-11-29 LAB — POCT INR: INR: 2.2

## 2016-12-01 ENCOUNTER — Other Ambulatory Visit (HOSPITAL_COMMUNITY): Payer: Self-pay | Admitting: Cardiology

## 2016-12-01 MED ORDER — AMIODARONE HCL 200 MG PO TABS: 100.0000 mg | ORAL_TABLET | Freq: Every day | ORAL | 3 refills | Status: DC

## 2016-12-03 IMAGING — MR MR CARD MORPHOLOGY WO/W CM
8 of 9 series · 15 of 16 positions shown · IV contrast (25    Multihance)
Comparison: none

CLINICAL DATA: Nonischemic cardiomyopathy

EXAM:
CARDIAC MRI
TECHNIQUE: The patient was scanned on a 1.5 Tesla GE magnet. A dedicated
cardiac coil was used. Functional imaging was done using Fiesta
sequences. [DATE], and 4 chamber views were done to assess for RWMA's.
Modified Trang rule using a short axis stack was used to
calculate an ejection fraction on a dedicated work station using
Circle software. The patient received 25 cc of Multihance. After 10
minutes inversion recovery sequences were used to assess for
infiltration and scar tissue.

[Series 3: bSSFP · sagittal · 8.0mm · 1.25mm/px · 1 of 14 slices shown (1 of 4)]
[im 1/14]
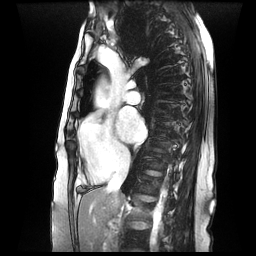

[Series 4: bSSFP · coronal · 8.0mm · 1.33mm/px · 1 of 20 slices shown (2 of 4)]
[im 1/20]
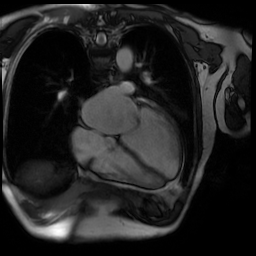

[Series 5: bSSFP · oblique · 8.0mm · 1.37mm/px · 8 of 380 slices shown (3 of 4)]
[im 1/380]
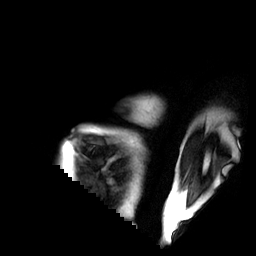
[im 55/380]
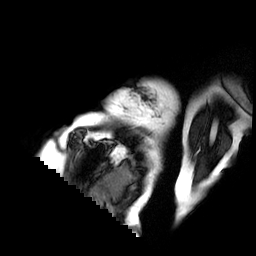
[im 109/380]
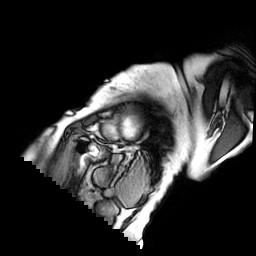
[im 163/380]
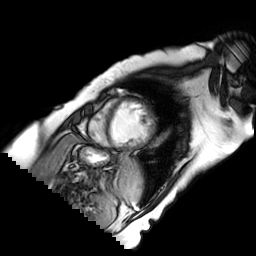
[im 217/380]
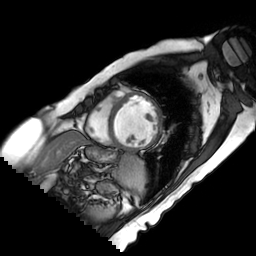
[im 271/380]
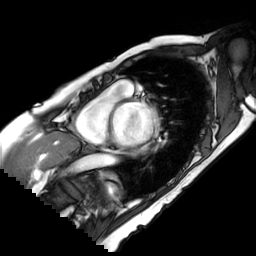
[im 325/380]
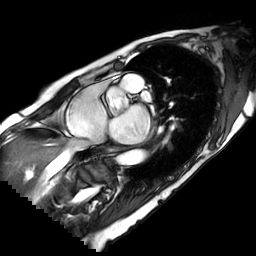
[im 380/380]
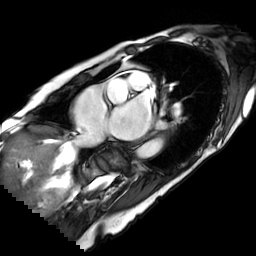

[Series 6: bSSFP · oblique · 8.0mm · 1.37mm/px · 1 of 60 slices shown (4 of 4)]
[im 1/60]
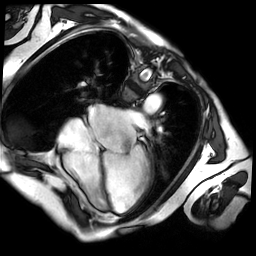

[Series 7: cine ir · sagittal · 8.0mm · 1.41mm/px · 1 of 30 slices shown]
[im 1/30]
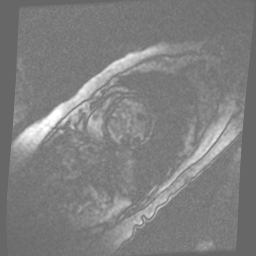

[Series 12: delayed ir prep · oblique · 8.0mm · 1.37mm/px · 1 of 6 slices shown (1 of 2)]
[im 1/6]
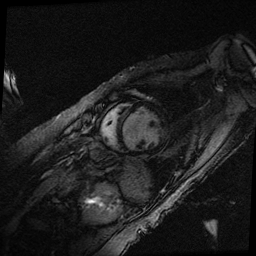

[Series 13: delayed ir prep · oblique · 8.0mm · 1.37mm/px · 1 of 11 slices shown (2 of 2)]
[im 1/11]
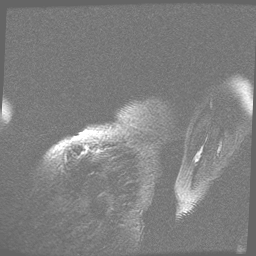

[Series 14: rad delayed ir · oblique · 8.0mm · 1.37mm/px · 1 of 3 slices shown]
[im 1/3]
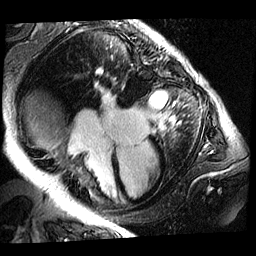

[15 of 16 positions shown; findings below may reference images not displayed]

FINDINGS: Limited images of the lung fields showed no gross abnormalities.

Moderately dilated left ventricle with normal wall thickness. EF 29%
with diffuse hypokinesis though septal function appeared worse than
the lateral wall. There was septal-lateral dyssynchrony. Normal
right ventricular size and systolic function. Moderate left atrial
enlargement. Normal right atrium. There was moderate,
posteriorly-directed mitral regurgitation with tethering of the
posterior leaflet. Trileaflet aortic valve with no stenosis and
trivial aortic insufficiency.

On delayed enhancement imaging, there was mid-wall late gadolinium
enhancement (LGE) in the basal to mid septum.

MEASUREMENTS:
MEASUREMENTS
LV EDV 225 mL

LV SV 65 mL

LV EF 29%
IMPRESSION: 1. Moderately dilated LV with EF 29%. Diffuse hypokinesis looking
worse in the septal wall. Septal-lateral dyssynchrony.

2.  Normal RV size and systolic function.

3.  Moderate MR with tethered posterior leaflet.

4. Mid-wall LGE in the basal to mid septum. This is not a coronary
disease pattern. Could be suggestive of prior myocarditis.

Marko Mali Sindjo

## 2016-12-21 ENCOUNTER — Other Ambulatory Visit (HOSPITAL_COMMUNITY): Payer: Self-pay | Admitting: Cardiology

## 2016-12-21 DIAGNOSIS — R300 Dysuria: Secondary | ICD-10-CM | POA: Diagnosis not present

## 2016-12-21 MED ORDER — METOPROLOL SUCCINATE ER 25 MG PO TB24: 37.5000 mg | ORAL_TABLET | Freq: Two times a day (BID) | ORAL | 3 refills | Status: DC

## 2016-12-21 MED ORDER — SACUBITRIL-VALSARTAN 24-26 MG PO TABS: 1.0000 | ORAL_TABLET | Freq: Two times a day (BID) | ORAL | 3 refills | Status: DC

## 2017-01-03 ENCOUNTER — Ambulatory Visit (INDEPENDENT_AMBULATORY_CARE_PROVIDER_SITE_OTHER): Payer: Medicare Other | Admitting: *Deleted

## 2017-01-03 DIAGNOSIS — I480 Paroxysmal atrial fibrillation: Secondary | ICD-10-CM | POA: Diagnosis not present

## 2017-01-03 DIAGNOSIS — Z5181 Encounter for therapeutic drug level monitoring: Secondary | ICD-10-CM

## 2017-01-03 LAB — POCT INR: INR: 3

## 2017-01-17 ENCOUNTER — Telehealth: Payer: Self-pay | Admitting: Cardiology

## 2017-01-17 ENCOUNTER — Ambulatory Visit (INDEPENDENT_AMBULATORY_CARE_PROVIDER_SITE_OTHER): Payer: Medicare Other | Admitting: *Deleted

## 2017-01-17 DIAGNOSIS — I428 Other cardiomyopathies: Secondary | ICD-10-CM

## 2017-01-17 NOTE — Progress Notes (Signed)
Remote ICD transmission.   

## 2017-01-17 NOTE — Telephone Encounter (Signed)
Confirmed remote transmission w/ pt.   

## 2017-01-18 ENCOUNTER — Encounter: Payer: Self-pay | Admitting: Cardiology

## 2017-01-19 DIAGNOSIS — Z23 Encounter for immunization: Secondary | ICD-10-CM | POA: Diagnosis not present

## 2017-01-19 LAB — CUP PACEART REMOTE DEVICE CHECK
Battery Remaining Longevity: 71 mo
Battery Voltage: 2.98 V
Brady Statistic AP VP Percent: 64.32 %
Brady Statistic AP VS Percent: 0.79 %
Brady Statistic AS VP Percent: 34.38 %
Brady Statistic AS VS Percent: 0.51 %
Brady Statistic RA Percent Paced: 65.1 %
Brady Statistic RV Percent Paced: 67.3 %
Date Time Interrogation Session: 20181002145534
HighPow Impedance: 67 Ohm
Implantable Lead Implant Date: 20170328
Implantable Lead Implant Date: 20170328
Implantable Lead Implant Date: 20170328
Implantable Lead Location: 753858
Implantable Lead Location: 753859
Implantable Lead Location: 753860
Implantable Lead Model: 4598
Implantable Lead Model: 5076
Implantable Lead Model: 6935
Implantable Pulse Generator Implant Date: 20170328
Lead Channel Impedance Value: 285 Ohm
Lead Channel Impedance Value: 304 Ohm
Lead Channel Impedance Value: 361 Ohm
Lead Channel Impedance Value: 399 Ohm
Lead Channel Impedance Value: 456 Ohm
Lead Channel Impedance Value: 513 Ohm
Lead Channel Impedance Value: 513 Ohm
Lead Channel Impedance Value: 513 Ohm
Lead Channel Impedance Value: 532 Ohm
Lead Channel Impedance Value: 551 Ohm
Lead Channel Impedance Value: 779 Ohm
Lead Channel Impedance Value: 817 Ohm
Lead Channel Impedance Value: 817 Ohm
Lead Channel Pacing Threshold Amplitude: 0.75 V
Lead Channel Pacing Threshold Amplitude: 0.875 V
Lead Channel Pacing Threshold Amplitude: 0.875 V
Lead Channel Pacing Threshold Pulse Width: 0.4 ms
Lead Channel Pacing Threshold Pulse Width: 0.4 ms
Lead Channel Pacing Threshold Pulse Width: 0.4 ms
Lead Channel Sensing Intrinsic Amplitude: 1 mV
Lead Channel Sensing Intrinsic Amplitude: 1 mV
Lead Channel Sensing Intrinsic Amplitude: 13 mV
Lead Channel Sensing Intrinsic Amplitude: 13 mV
Lead Channel Setting Pacing Amplitude: 2 V
Lead Channel Setting Pacing Amplitude: 2.5 V
Lead Channel Setting Pacing Amplitude: 2.5 V
Lead Channel Setting Pacing Pulse Width: 0.4 ms
Lead Channel Setting Pacing Pulse Width: 0.4 ms
Lead Channel Setting Sensing Sensitivity: 0.3 mV

## 2017-02-03 ENCOUNTER — Encounter: Payer: Self-pay | Admitting: Cardiology

## 2017-02-06 DIAGNOSIS — M25551 Pain in right hip: Secondary | ICD-10-CM | POA: Diagnosis not present

## 2017-02-06 DIAGNOSIS — M7631 Iliotibial band syndrome, right leg: Secondary | ICD-10-CM | POA: Diagnosis not present

## 2017-02-08 ENCOUNTER — Ambulatory Visit (INDEPENDENT_AMBULATORY_CARE_PROVIDER_SITE_OTHER): Payer: Medicare Other | Admitting: *Deleted

## 2017-02-08 DIAGNOSIS — Z5181 Encounter for therapeutic drug level monitoring: Secondary | ICD-10-CM

## 2017-02-08 DIAGNOSIS — I480 Paroxysmal atrial fibrillation: Secondary | ICD-10-CM

## 2017-02-08 LAB — POCT INR: INR: 2.7

## 2017-02-10 ENCOUNTER — Other Ambulatory Visit: Payer: Self-pay | Admitting: Cardiology

## 2017-02-20 DIAGNOSIS — M7631 Iliotibial band syndrome, right leg: Secondary | ICD-10-CM | POA: Diagnosis not present

## 2017-03-03 ENCOUNTER — Other Ambulatory Visit (HOSPITAL_COMMUNITY): Payer: Self-pay | Admitting: Internal Medicine

## 2017-03-06 ENCOUNTER — Other Ambulatory Visit (HOSPITAL_COMMUNITY): Payer: Self-pay | Admitting: *Deleted

## 2017-03-06 MED ORDER — AMIODARONE HCL 200 MG PO TABS: 100.0000 mg | ORAL_TABLET | Freq: Every day | ORAL | 3 refills | Status: DC

## 2017-03-07 ENCOUNTER — Encounter: Payer: Self-pay | Admitting: Internal Medicine

## 2017-03-07 ENCOUNTER — Ambulatory Visit (INDEPENDENT_AMBULATORY_CARE_PROVIDER_SITE_OTHER): Payer: Medicare Other | Admitting: Internal Medicine

## 2017-03-07 DIAGNOSIS — R05 Cough: Secondary | ICD-10-CM

## 2017-03-07 DIAGNOSIS — R059 Cough, unspecified: Secondary | ICD-10-CM

## 2017-03-07 MED ORDER — DOXYCYCLINE HYCLATE 100 MG PO TABS: 100.0000 mg | ORAL_TABLET | Freq: Two times a day (BID) | ORAL | 0 refills | Status: DC

## 2017-03-07 MED ORDER — CITALOPRAM HYDROBROMIDE 20 MG PO TABS: 20.0000 mg | ORAL_TABLET | Freq: Every day | ORAL | 3 refills | Status: DC

## 2017-03-07 NOTE — Progress Notes (Signed)
   Subjective:    Patient ID: Cynthia Frank, female    DOB: Oct 25, 1935, 81 y.o.   MRN: [id number]  HPI The patient is a 81 YO female coming in for cough and congestion symptoms for about 1 week, She has taken zicam and mucinex DM. Overall symptoms worsening. Some SOB and productive cough with yellow sputum. She is also having some nose drainage and congestion. Using her nose spray with some relief. Denies fevers but some chills. Denies known sick contact.  Review of Systems  Constitutional: Positive for activity change, appetite change and chills. Negative for fatigue, fever and unexpected weight change.  HENT: Positive for congestion, postnasal drip, rhinorrhea and sinus pressure. Negative for ear discharge, ear pain, sinus pain, sneezing, sore throat, tinnitus, trouble swallowing and voice change.   Eyes: Negative.   Respiratory: Positive for cough and shortness of breath. Negative for chest tightness and wheezing.   Cardiovascular: Negative.   Gastrointestinal: Negative.   Musculoskeletal: Negative for myalgias.  Neurological: Negative.       Objective:   Physical Exam  Constitutional: She is oriented to person, place, and time. She appears well-developed and well-nourished.  HENT:  Head: Normocephalic and atraumatic.  Oropharynx with redness and clear drainage, nose with swollen turbinates, TMs normal bilaterally  Eyes: EOM are normal.  Neck: Normal range of motion. No thyromegaly present.  Cardiovascular: Normal rate and regular rhythm.  Pulmonary/Chest: Effort normal and breath sounds normal. No respiratory distress. She has no wheezes. She has no rales.  Some scattered rhonchi that clear with coughing  Abdominal: Soft.  Musculoskeletal: She exhibits no tenderness.  Lymphadenopathy:    She has cervical adenopathy.  Neurological: She is alert and oriented to person, place, and time.  Skin: Skin is warm and dry.   Vitals:   03/07/17 1606  BP: 118/78  Pulse: 68  Temp:  98.3 F (36.8 C)  TempSrc: Oral  SpO2: 98%  Weight: 143 lb (64.9 kg)  Height: 5\' 5"  (1.651 m)      Assessment & Plan:

## 2017-03-07 NOTE — Patient Instructions (Addendum)
We have sent in the citalopram to try taking.  We have sent in doxycycline to take 1 pill twice a day for 5 days.

## 2017-03-08 ENCOUNTER — Encounter: Payer: Self-pay | Admitting: Internal Medicine

## 2017-03-08 NOTE — Assessment & Plan Note (Signed)
Rx for doxycycline given time course, rhonchi, and lack of improvement. Does not need steroids at this time.

## 2017-03-23 ENCOUNTER — Ambulatory Visit (INDEPENDENT_AMBULATORY_CARE_PROVIDER_SITE_OTHER): Payer: Medicare Other

## 2017-03-23 DIAGNOSIS — Z7901 Long term (current) use of anticoagulants: Secondary | ICD-10-CM | POA: Insufficient documentation

## 2017-03-23 DIAGNOSIS — I480 Paroxysmal atrial fibrillation: Secondary | ICD-10-CM

## 2017-03-23 DIAGNOSIS — Z5181 Encounter for therapeutic drug level monitoring: Secondary | ICD-10-CM | POA: Diagnosis not present

## 2017-03-23 LAB — POCT INR: INR: 2.3

## 2017-03-23 NOTE — Patient Instructions (Signed)
Continue same dosage 1/2 tablet daily except 1 tablet on Mondays, Wednesdays, and Fridays.  Recheck INR in 6 weeks.  Call us with any new medications # [phone].

## 2017-03-31 ENCOUNTER — Other Ambulatory Visit (HOSPITAL_COMMUNITY): Payer: Self-pay | Admitting: *Deleted

## 2017-03-31 MED ORDER — POTASSIUM CHLORIDE CRYS ER 20 MEQ PO TBCR: 40.0000 meq | EXTENDED_RELEASE_TABLET | Freq: Every day | ORAL | 6 refills | Status: DC

## 2017-04-16 ENCOUNTER — Other Ambulatory Visit: Payer: Self-pay | Admitting: Internal Medicine

## 2017-04-19 ENCOUNTER — Ambulatory Visit (INDEPENDENT_AMBULATORY_CARE_PROVIDER_SITE_OTHER): Payer: Medicare Other | Admitting: *Deleted

## 2017-04-19 DIAGNOSIS — I428 Other cardiomyopathies: Secondary | ICD-10-CM

## 2017-04-20 NOTE — Progress Notes (Signed)
Remote ICD transmission.   

## 2017-04-21 ENCOUNTER — Encounter: Payer: Self-pay | Admitting: Cardiology

## 2017-04-22 LAB — CUP PACEART REMOTE DEVICE CHECK
Battery Remaining Longevity: 67 mo
Battery Voltage: 2.97 V
Brady Statistic AP VP Percent: 59.57 %
Brady Statistic AP VS Percent: 0.84 %
Brady Statistic AS VP Percent: 39.01 %
Brady Statistic AS VS Percent: 0.58 %
Brady Statistic RA Percent Paced: 60.36 %
Brady Statistic RV Percent Paced: 61.43 %
Date Time Interrogation Session: 20190102081608
HighPow Impedance: 67 Ohm
Implantable Lead Implant Date: 20170328
Implantable Lead Implant Date: 20170328
Implantable Lead Implant Date: 20170328
Implantable Lead Location: 753858
Implantable Lead Location: 753859
Implantable Lead Location: 753860
Implantable Lead Model: 4598
Implantable Lead Model: 5076
Implantable Lead Model: 6935
Implantable Pulse Generator Implant Date: 20170328
Lead Channel Impedance Value: 342 Ohm
Lead Channel Impedance Value: 342 Ohm
Lead Channel Impedance Value: 361 Ohm
Lead Channel Impedance Value: 399 Ohm
Lead Channel Impedance Value: 475 Ohm
Lead Channel Impedance Value: 475 Ohm
Lead Channel Impedance Value: 513 Ohm
Lead Channel Impedance Value: 589 Ohm
Lead Channel Impedance Value: 589 Ohm
Lead Channel Impedance Value: 646 Ohm
Lead Channel Impedance Value: 817 Ohm
Lead Channel Impedance Value: 817 Ohm
Lead Channel Impedance Value: 836 Ohm
Lead Channel Pacing Threshold Amplitude: 0.75 V
Lead Channel Pacing Threshold Amplitude: 0.75 V
Lead Channel Pacing Threshold Amplitude: 0.875 V
Lead Channel Pacing Threshold Pulse Width: 0.4 ms
Lead Channel Pacing Threshold Pulse Width: 0.4 ms
Lead Channel Pacing Threshold Pulse Width: 0.4 ms
Lead Channel Sensing Intrinsic Amplitude: 0.625 mV
Lead Channel Sensing Intrinsic Amplitude: 0.625 mV
Lead Channel Sensing Intrinsic Amplitude: 12.875 mV
Lead Channel Sensing Intrinsic Amplitude: 12.875 mV
Lead Channel Setting Pacing Amplitude: 2 V
Lead Channel Setting Pacing Amplitude: 2.5 V
Lead Channel Setting Pacing Amplitude: 2.5 V
Lead Channel Setting Pacing Pulse Width: 0.4 ms
Lead Channel Setting Pacing Pulse Width: 0.4 ms
Lead Channel Setting Sensing Sensitivity: 0.3 mV

## 2017-04-25 ENCOUNTER — Other Ambulatory Visit (HOSPITAL_COMMUNITY): Payer: Self-pay | Admitting: Cardiology

## 2017-04-25 MED ORDER — SACUBITRIL-VALSARTAN 24-26 MG PO TABS: 1.0000 | ORAL_TABLET | Freq: Two times a day (BID) | ORAL | 3 refills | Status: DC

## 2017-04-27 ENCOUNTER — Telehealth: Payer: Self-pay | Admitting: Internal Medicine

## 2017-04-27 NOTE — Telephone Encounter (Signed)
Spoke with Mr. Cynthia Frank regarding AWV. Pt stated declined to schedule AWV for 2019, but she stated she wants to wait until next year (2020). SF

## 2017-05-04 ENCOUNTER — Ambulatory Visit (INDEPENDENT_AMBULATORY_CARE_PROVIDER_SITE_OTHER): Payer: Medicare Other

## 2017-05-04 DIAGNOSIS — I480 Paroxysmal atrial fibrillation: Secondary | ICD-10-CM | POA: Diagnosis not present

## 2017-05-04 DIAGNOSIS — Z7901 Long term (current) use of anticoagulants: Secondary | ICD-10-CM

## 2017-05-04 LAB — POCT INR: INR: 2.2

## 2017-05-04 NOTE — Patient Instructions (Signed)
Description   Continue same dosage 1/2 tablet daily except 1 tablet on Mondays, Wednesdays, and Fridays.  Recheck INR in 6 weeks.  Call us with any new medications # [phone].

## 2017-05-19 DIAGNOSIS — L720 Epidermal cyst: Secondary | ICD-10-CM | POA: Diagnosis not present

## 2017-05-19 DIAGNOSIS — D2371 Other benign neoplasm of skin of right lower limb, including hip: Secondary | ICD-10-CM | POA: Diagnosis not present

## 2017-05-19 DIAGNOSIS — L821 Other seborrheic keratosis: Secondary | ICD-10-CM | POA: Diagnosis not present

## 2017-05-19 DIAGNOSIS — D2372 Other benign neoplasm of skin of left lower limb, including hip: Secondary | ICD-10-CM | POA: Diagnosis not present

## 2017-05-19 DIAGNOSIS — D1801 Hemangioma of skin and subcutaneous tissue: Secondary | ICD-10-CM | POA: Diagnosis not present

## 2017-05-22 ENCOUNTER — Encounter (HOSPITAL_COMMUNITY): Payer: Medicare Other | Admitting: Cardiology

## 2017-05-23 ENCOUNTER — Encounter: Payer: Self-pay | Admitting: Gastroenterology

## 2017-05-23 ENCOUNTER — Other Ambulatory Visit (HOSPITAL_COMMUNITY): Payer: Self-pay | Admitting: *Deleted

## 2017-05-23 MED ORDER — FUROSEMIDE 40 MG PO TABS: ORAL_TABLET | ORAL | 3 refills | Status: DC

## 2017-05-24 DIAGNOSIS — Z974 Presence of external hearing-aid: Secondary | ICD-10-CM | POA: Diagnosis not present

## 2017-05-24 DIAGNOSIS — H903 Sensorineural hearing loss, bilateral: Secondary | ICD-10-CM | POA: Diagnosis not present

## 2017-05-26 DIAGNOSIS — J385 Laryngeal spasm: Secondary | ICD-10-CM | POA: Diagnosis not present

## 2017-05-26 DIAGNOSIS — J383 Other diseases of vocal cords: Secondary | ICD-10-CM | POA: Diagnosis not present

## 2017-05-27 ENCOUNTER — Other Ambulatory Visit: Payer: Self-pay | Admitting: Internal Medicine

## 2017-05-29 NOTE — Telephone Encounter (Signed)
Control database checked last refill: 03/19/2017 LOV: 03/07/2017 for cough, CPE 04/21/2016

## 2017-06-26 ENCOUNTER — Ambulatory Visit (INDEPENDENT_AMBULATORY_CARE_PROVIDER_SITE_OTHER): Payer: Medicare Other | Admitting: Pharmacist

## 2017-06-26 DIAGNOSIS — Z7901 Long term (current) use of anticoagulants: Secondary | ICD-10-CM | POA: Diagnosis not present

## 2017-06-26 DIAGNOSIS — I480 Paroxysmal atrial fibrillation: Secondary | ICD-10-CM | POA: Diagnosis not present

## 2017-06-26 LAB — POCT INR: INR: 1.9

## 2017-06-26 NOTE — Patient Instructions (Signed)
Description   Take 1.5 tablets today, then continue same dosage 1/2 tablet daily except 1 tablet on Mondays, Wednesdays, and Fridays.  Recheck INR in 5 weeks.  Call us with any new medications # [phone].

## 2017-06-27 ENCOUNTER — Ambulatory Visit (HOSPITAL_COMMUNITY)
Admission: RE | Admit: 2017-06-27 | Discharge: 2017-06-27 | Disposition: A | Discharge status: Home / Self Care | Payer: Medicare Other | Source: Ambulatory Visit | Attending: Cardiology | Admitting: Cardiology

## 2017-06-27 ENCOUNTER — Encounter (HOSPITAL_COMMUNITY): Payer: Self-pay | Admitting: Cardiology

## 2017-06-27 ENCOUNTER — Other Ambulatory Visit: Payer: Self-pay

## 2017-06-27 VITALS — BP 109/63 | HR 61 | Wt 140.0 lb

## 2017-06-27 DIAGNOSIS — Z79899 Other long term (current) drug therapy: Secondary | ICD-10-CM | POA: Diagnosis not present

## 2017-06-27 DIAGNOSIS — I110 Hypertensive heart disease with heart failure: Secondary | ICD-10-CM | POA: Diagnosis not present

## 2017-06-27 DIAGNOSIS — Z87891 Personal history of nicotine dependence: Secondary | ICD-10-CM | POA: Insufficient documentation

## 2017-06-27 DIAGNOSIS — Z85038 Personal history of other malignant neoplasm of large intestine: Secondary | ICD-10-CM | POA: Insufficient documentation

## 2017-06-27 DIAGNOSIS — I5022 Chronic systolic (congestive) heart failure: Secondary | ICD-10-CM | POA: Diagnosis not present

## 2017-06-27 DIAGNOSIS — Z7901 Long term (current) use of anticoagulants: Secondary | ICD-10-CM | POA: Diagnosis not present

## 2017-06-27 DIAGNOSIS — R55 Syncope and collapse: Secondary | ICD-10-CM | POA: Insufficient documentation

## 2017-06-27 DIAGNOSIS — I428 Other cardiomyopathies: Secondary | ICD-10-CM | POA: Insufficient documentation

## 2017-06-27 DIAGNOSIS — K219 Gastro-esophageal reflux disease without esophagitis: Secondary | ICD-10-CM | POA: Diagnosis not present

## 2017-06-27 DIAGNOSIS — R9431 Abnormal electrocardiogram [ECG] [EKG]: Secondary | ICD-10-CM | POA: Diagnosis not present

## 2017-06-27 DIAGNOSIS — I480 Paroxysmal atrial fibrillation: Secondary | ICD-10-CM | POA: Diagnosis not present

## 2017-06-27 DIAGNOSIS — R740 Nonspecific elevation of levels of transaminase and lactic acid dehydrogenase [LDH]: Secondary | ICD-10-CM | POA: Insufficient documentation

## 2017-06-27 DIAGNOSIS — I493 Ventricular premature depolarization: Secondary | ICD-10-CM | POA: Insufficient documentation

## 2017-06-27 DIAGNOSIS — Z8543 Personal history of malignant neoplasm of ovary: Secondary | ICD-10-CM | POA: Insufficient documentation

## 2017-06-27 LAB — COMPREHENSIVE METABOLIC PANEL
ALT: 25 U/L (ref 14–54)
AST: 35 U/L (ref 15–41)
Albumin: 4 g/dL (ref 3.5–5.0)
Alkaline Phosphatase: 66 U/L (ref 38–126)
Anion gap: 9 (ref 5–15)
BUN: 22 mg/dL — ABNORMAL HIGH (ref 6–20)
CO2: 27 mmol/L (ref 22–32)
Calcium: 9 mg/dL (ref 8.9–10.3)
Chloride: 100 mmol/L — ABNORMAL LOW (ref 101–111)
Creatinine, Ser: 1.29 mg/dL — ABNORMAL HIGH (ref 0.44–1.00)
GFR calc Af Amer: 44 mL/min — ABNORMAL LOW (ref >60)
GFR calc non Af Amer: 38 mL/min — ABNORMAL LOW (ref >60)
Glucose, Bld: 85 mg/dL (ref 65–99)
Potassium: 4.4 mmol/L (ref 3.5–5.1)
Sodium: 136 mmol/L (ref 135–145)
Total Bilirubin: 0.9 mg/dL (ref 0.3–1.2)
Total Protein: 7 g/dL (ref 6.5–8.1)

## 2017-06-27 LAB — CBC
HCT: 40.7 % (ref 36.0–46.0)
Hemoglobin: 13.3 g/dL (ref 12.0–15.0)
MCH: 31 pg (ref 26.0–34.0)
MCHC: 32.7 g/dL (ref 30.0–36.0)
MCV: 94.9 fL (ref 78.0–100.0)
Platelets: 138 10*3/uL — ABNORMAL LOW (ref 150–400)
RBC: 4.29 MIL/uL (ref 3.87–5.11)
RDW: 14.2 % (ref 11.5–15.5)
WBC: 6.2 10*3/uL (ref 4.0–10.5)

## 2017-06-27 LAB — TSH: TSH: 5.918 u[IU]/mL — ABNORMAL HIGH (ref 0.350–4.500)

## 2017-06-27 MED ORDER — METOPROLOL SUCCINATE ER 50 MG PO TB24: 50.0000 mg | ORAL_TABLET | Freq: Two times a day (BID) | ORAL | 3 refills | Status: DC

## 2017-06-27 NOTE — Patient Instructions (Signed)
Increase Metoprolol XL 50 mg (1 tab), twice a day  Labs drawn today (if we do not call you, then your lab work was stable)    Your physician has requested that you have an echocardiogram. Echocardiography is a painless test that uses sound waves to create images of your heart. It provides your doctor with information about the size and shape of your heart and how well your heart's chambers and valves are working. This procedure takes approximately one hour. There are no restrictions for this procedure.  (they will call you) .   Your physician recommends that you schedule a follow-up appointment in: 4 months (July, 2019) with Dr. Aundra Dubin  Please Call an Schedule Appointment

## 2017-06-28 ENCOUNTER — Telehealth: Payer: Self-pay | Admitting: Internal Medicine

## 2017-06-28 MED ORDER — SACUBITRIL-VALSARTAN 24-26 MG PO TABS: 1.0000 | ORAL_TABLET | Freq: Two times a day (BID) | ORAL | 3 refills | Status: DC

## 2017-06-28 NOTE — Telephone Encounter (Signed)
Patient calling, states that she is returning call.  °

## 2017-06-28 NOTE — Telephone Encounter (Signed)
Called patient and she could not recall who or why someone called her. I reviewed her labs and notes and there was nothing out of the ordinary. She did ask that we renew her Entresto prescription which I was able to do. Pt had no further questions or concerns.

## 2017-06-28 NOTE — Progress Notes (Signed)
Patient ID: Cynthia Frank, female   DOB: 30-Aug-1935, 82 y.o.   MRN: [id number]    Advanced Heart Failure Clinic Note   PCP: Dr. Sharlet Salina HF Cardiology: Dr. Aundra Dubin  82 y.o. with history of chronic systolic CHF/nonischemic cardiomyopathy, paroxysmal atrial fibrillation, and prior ovarian and colon cancers presents for CHF followup. She has had a cardiomyopathy known for > 15 years now. Cardiac cath at diagnosis showed mild nonobstructive disease.  EF has been persistently low.  Cause has been thought to be Adriamycin; she received this as part of her ovarian cancer treatment. She has had paroxysmal atrial fibrillation and has remained in NSR with amiodarone use.  No recent atrial fibrillation symptoms, typically feels prolonged palpitations thought to be atrial fibrillation 2-3 times/year.   She had had dyspnea with moderate to heavy exertion for 4-5 years prior to initial appt.  However, just before her initial appt, her symptoms had worsened.  She developed shortness of breath walking short distances on flat ground.  Lasix was increased to 40 mg bid and she lost weight and felt better.     Cardiac MRI was done in 1/17, showing EF 29% with normal RV.  Septal-lateral dyssynchrony was present and there was a non-coronary pattern of LGE in the septum.  LFTs were noted to be mildly elevated.  Amiodarone was decreased to 100 mg daily.   She had Medtronic CRT-D placed in 3/17. Repeat echo 8/17 showed EF 35% with diffuse hypokinesis, normal RV.   She presents today for regular followup. She is doing well overall. She is short of breath (mildly) walking up stairs but not short of breath walking on flat ground. No BRBPR/melena.  No chest pain.  No lightheadedness or falls.   Labs (1/17): K 4.2, creatinine 1.14, BNP 855 Labs (3/17): SPEP negative, BNP 341, K 4.5, creatinine 1.27, AST 62, ALT 74, TSH normal with low free T3 and normal free T4, HCT 46.5 Labs (4/17): AST 64, ALT 67, BNP 278, K 3.6,  creatinine 1.21 Labs (5/17): K 3.4, creatinine 1.39, AST 53, ALT 61, HBV/HCV negative, TSH mildly elevated, BNP 306 Labs (6/17): K 4.4, creatinine 1.36, free T4 normal, free T3 mildly decreased, AST 50, ALT 53. Labs (7/17): ANA negative, ASMA positive Labs (8/17): AST 45, ALT 37, K 3.8, creatinine 1.19, TSH normal Labs (2/18): TSH normal, LDL 47, LFTs, normal Labs (3/18): K normal, creatinine 1.23.  Labs (7/18): K 4, creatinine 1.32, LFTs normal  ECG (personally reviewed): A-V sequential pacing  PMH: 1. Chronic systolic CHF: Nonischemic cardiomyopathy, thought to be related to adriamycin that she had for treatment of ovarian cancer.  NICM diagnosed ~ 2001.  - LHC ~ 2001 with 30% stenosis D1.   - Echo (2013) EF 25-30%.  - Echo (10/14) with EF 25-30%. - Echo (12/16) with EF 20-25%, mild LV dilation, restrictive diastolic function, moderate MR, severe LAE. - Cardiac MRI (1/17): Moderately dilated LV with EF 29%. Diffuse hypokinesis looking worse in the septal wall. Septal-lateral dyssynchrony. Normal RV size and systolic function.  Moderate MR with tethered posterior leaflet.  Mid-wall LGE in the basal to mid septum. This is not a coronary disease pattern. Could be suggestive of prior myocarditis. - Medtronic CRT-D placed 3/17.  - Echo (8/17): EF 35%, diffuse hypokinesis, normal RV size and systolic function, moderate MR.  2. Atrial fibrillation: Paroxysmal.  3. HTN 4. Ovarian cancer: 1985, s/p surgery. Received Adriamycin-based chemotherapy.  5. Colon cancer: 2007, s/p surgery.  6. PVCs 7. GERD 8. PFTs (  8/16) without significant abnormality. 9. Prior syncope 10. Elevated transaminases: Uncertain etiology.   SH: Married, lives in [place], prior smoker.   FH: Father with MI.   ROS: All systems reviewed and negative except as per HPI.   Current Outpatient Medications  Medication Sig Dispense Refill  . amiodarone (PACERONE) 200 MG tablet Take 0.5 tablets (100 mg total) daily by  mouth. 45 tablet 3  . atorvastatin (LIPITOR) 40 MG tablet TAKE 1 TABLET (40 MG TOTAL) BY MOUTH DAILY. 90 tablet 1  . busPIRone (BUSPAR) 5 MG tablet Take 1 tablet (5 mg total) by mouth daily as needed (anxiety). 30 tablet 3  . citalopram (CELEXA) 20 MG tablet Take 1 tablet (20 mg total) by mouth daily. Need annual visit fir further refills 30 tablet 0  . Coenzyme Q10 (COQ10) 100 MG CAPS Take 100 mg by mouth daily.     Marland Kitchen conjugated estrogens (PREMARIN) vaginal cream Place 1 Applicatorful vaginally as needed (dyspareunia (Use as directed)). Reported on 05/11/2015    . diclofenac sodium (VOLTAREN) 1 % GEL APPLY 2 GRAMS TOPICALLY 4TIMES DAILY 100 g 6  . furosemide (LASIX) 40 MG tablet Take 1 tab in AM and 1/2 tab in PM 45 tablet 3  . ipratropium (ATROVENT) 0.03 % nasal spray Place 2 sprays into both nostrils 2 (two) times daily.  11  . Magnesium Oxide 500 MG CAPS Take 2,000 mg by mouth daily.    . metoprolol succinate (TOPROL-XL) 50 MG 24 hr tablet Take 1 tablet (50 mg total) by mouth 2 (two) times daily. 60 tablet 3  . potassium chloride SA (K-DUR,KLOR-CON) 20 MEQ tablet Take 2 tablets (40 mEq total) by mouth daily. 60 tablet 6  . sacubitril-valsartan (ENTRESTO) 24-26 MG Take 1 tablet by mouth 2 (two) times daily. 60 tablet 3  . spironolactone (ALDACTONE) 25 MG tablet Take 1 tablet (25 mg total) by mouth every evening. 90 tablet 3  . traMADol (ULTRAM) 50 MG tablet Take 50 mg by mouth every 6 (six) hours as needed for moderate pain. Reported on 10/16/2015    . tretinoin (RETIN-A) 0.1 % cream Apply 1 application topically every morning.     . warfarin (COUMADIN) 2.5 MG tablet TAKE AS DIRECTED BY COUMADIN CLINIC 90 tablet 1  . zolpidem (AMBIEN) 5 MG tablet TAKE 1 TABLET BY MOUTH EVERY DAY AT BEDTIME AS NEEDED 30 tablet 5   No current facility-administered medications for this encounter.    BP 109/63   Pulse 61   Wt 140 lb (63.5 kg)   SpO2 99%   BMI 23.30 kg/m    Wt Readings from Last 3 Encounters:   06/27/17 140 lb (63.5 kg)  03/07/17 143 lb (64.9 kg)  10/27/16 142 lb 9.6 oz (64.7 kg)   General: NAD Neck: No JVD, no thyromegaly or thyroid nodule.  Lungs: Clear to auscultation bilaterally with normal respiratory effort. CV: Nondisplaced PMI.  Heart regular S1/S2, no S3/S4, no murmur.  No peripheral edema.  No carotid bruit.  Normal pedal pulses.  Abdomen: Soft, nontender, no hepatosplenomegaly, no distention.  Skin: Intact without lesions or rashes.  Neurologic: Alert and oriented x 3.  Psych: Normal affect. Extremities: No clubbing or cyanosis.  HEENT: Normal.   Assessment/Plan: 1. Chronic systolic CHF: EF 01-75% with diffuse hypokinesis on 12/16 echo.  Nonischemic cardiomyopathy, probably related to Adriamycin with ovarian cancer treatment.  Cardiac MRI in 1/17 showed EF 29% with normal RV and septal-lateral dyssynchrony.  There was a non-cardiac pattern of LGE  in the septum, cannot rule out prior myocarditis based on this pattern.  She has a Medtronic CRT-D device.  Echo 8/17 with EF 35%.  NYHA class II symptoms currently.  She looks euvolemic on exam.    - Increase Toprol XL to 50 mg bid.    - Continue spironolactone 25 daily. BMET today.  - Continue Entresto 24/26 bid.     - Continue Lasix 40 qam/20 qpm and KCl 40 daily.  - I will get repeat echo.  2. Atrial fibrillation: Paroxysmal.  Maintaining NSR with amiodarone.  - Continue amiodarone at 100 mg daily. Check LFTs and TSH today.  She will need periodic eye exam as well.  - Continue warfarin.  3. Elevated LFTs: This may be due to CHF versus amiodarone.  Amiodarone decreased to 100 mg daily.  Abdominal US did not show liver abnormalities or gallstones, CT abdomen showed normal liver in 7/17.  HCV/HBV serologies negative, ANA negative, ASMA positive but not thought to be significant.  LFTs back to normal in 3/18.     Followup in 4 months  [name] 06/28/2017

## 2017-06-29 ENCOUNTER — Other Ambulatory Visit (HOSPITAL_COMMUNITY): Payer: Self-pay | Admitting: Internal Medicine

## 2017-07-03 DIAGNOSIS — H2513 Age-related nuclear cataract, bilateral: Secondary | ICD-10-CM | POA: Diagnosis not present

## 2017-07-03 DIAGNOSIS — H5203 Hypermetropia, bilateral: Secondary | ICD-10-CM | POA: Diagnosis not present

## 2017-07-10 ENCOUNTER — Other Ambulatory Visit: Payer: Self-pay | Admitting: Internal Medicine

## 2017-07-21 ENCOUNTER — Other Ambulatory Visit: Payer: Self-pay | Admitting: Internal Medicine

## 2017-07-21 ENCOUNTER — Ambulatory Visit (INDEPENDENT_AMBULATORY_CARE_PROVIDER_SITE_OTHER): Payer: Medicare Other | Admitting: Internal Medicine

## 2017-07-21 ENCOUNTER — Encounter: Payer: Self-pay | Admitting: Internal Medicine

## 2017-07-21 VITALS — BP 118/70 | HR 60 | Ht 65.0 in | Wt 142.0 lb

## 2017-07-21 DIAGNOSIS — I428 Other cardiomyopathies: Secondary | ICD-10-CM

## 2017-07-21 DIAGNOSIS — Z95810 Presence of automatic (implantable) cardiac defibrillator: Secondary | ICD-10-CM | POA: Diagnosis not present

## 2017-07-21 DIAGNOSIS — I480 Paroxysmal atrial fibrillation: Secondary | ICD-10-CM

## 2017-07-21 DIAGNOSIS — I5022 Chronic systolic (congestive) heart failure: Secondary | ICD-10-CM | POA: Diagnosis not present

## 2017-07-21 LAB — CUP PACEART INCLINIC DEVICE CHECK
Battery Remaining Longevity: 64 mo
Battery Voltage: 2.98 V
Brady Statistic AP VP Percent: 64.67 %
Brady Statistic AP VS Percent: 0.89 %
Brady Statistic AS VP Percent: 33.88 %
Brady Statistic AS VS Percent: 0.57 %
Brady Statistic RA Percent Paced: 65.49 %
Brady Statistic RV Percent Paced: 67.95 %
Date Time Interrogation Session: 20190405141957
HighPow Impedance: 70 Ohm
Implantable Lead Implant Date: 20170328
Implantable Lead Implant Date: 20170328
Implantable Lead Implant Date: 20170328
Implantable Lead Location: 753858
Implantable Lead Location: 753859
Implantable Lead Location: 753860
Implantable Lead Model: 4598
Implantable Lead Model: 5076
Implantable Lead Model: 6935
Implantable Pulse Generator Implant Date: 20170328
Lead Channel Impedance Value: 342 Ohm
Lead Channel Impedance Value: 342 Ohm
Lead Channel Impedance Value: 361 Ohm
Lead Channel Impedance Value: 399 Ohm
Lead Channel Impedance Value: 418 Ohm
Lead Channel Impedance Value: 532 Ohm
Lead Channel Impedance Value: 532 Ohm
Lead Channel Impedance Value: 551 Ohm
Lead Channel Impedance Value: 589 Ohm
Lead Channel Impedance Value: 646 Ohm
Lead Channel Impedance Value: 874 Ohm
Lead Channel Impedance Value: 874 Ohm
Lead Channel Impedance Value: 893 Ohm
Lead Channel Pacing Threshold Amplitude: 0.75 V
Lead Channel Pacing Threshold Amplitude: 0.75 V
Lead Channel Pacing Threshold Amplitude: 0.75 V
Lead Channel Pacing Threshold Pulse Width: 0.4 ms
Lead Channel Pacing Threshold Pulse Width: 0.4 ms
Lead Channel Pacing Threshold Pulse Width: 0.4 ms
Lead Channel Sensing Intrinsic Amplitude: 0.5 mV
Lead Channel Sensing Intrinsic Amplitude: 11.875 mV
Lead Channel Setting Pacing Amplitude: 1.75 V
Lead Channel Setting Pacing Amplitude: 2 V
Lead Channel Setting Pacing Amplitude: 2.5 V
Lead Channel Setting Pacing Pulse Width: 0.4 ms
Lead Channel Setting Pacing Pulse Width: 0.4 ms
Lead Channel Setting Sensing Sensitivity: 0.3 mV

## 2017-07-21 NOTE — Progress Notes (Addendum)
HPI Mrs. Kite returns today for ongoing BiV ICD evaluation and management. She is a pleasant 82 yo woman with a h/o ovarian CA, s/p surgery and chemotherapy including adriamycin (1985), atrial fib well controlled with amiodarone, IVCD/LBBB with a QRS of 164, and dysynchrony on MRI and class 2 CHF. She has a h/o syncope, the last episode occuring approx 4 years ago. She remains active. She underwent BiV ICD insertion just over 2 years ago. In the interim, she has done well. No chest pain or sob. No edema. No ICD shocks.  Allergies  Allergen Reactions  . Clindamycin/Lincomycin     itching  . Pneumococcal Vaccines Itching and Swelling    Redness   . Sulfonamide Derivatives Itching and Swelling     Current Outpatient Medications  Medication Sig Dispense Refill  . amiodarone (PACERONE) 200 MG tablet Take 0.5 tablets (100 mg total) daily by mouth. 45 tablet 3  . atorvastatin (LIPITOR) 40 MG tablet TAKE 1 TABLET (40 MG TOTAL) BY MOUTH DAILY. 90 tablet 1  . busPIRone (BUSPAR) 5 MG tablet Take 1 tablet (5 mg total) by mouth daily as needed (anxiety). 30 tablet 3  . citalopram (CELEXA) 20 MG tablet Take 1 tablet (20 mg total) by mouth daily. Need annual visit fir further refills 30 tablet 0  . Coenzyme Q10 (COQ10) 100 MG CAPS Take 100 mg by mouth daily.     Marland Kitchen conjugated estrogens (PREMARIN) vaginal cream Place 1 Applicatorful vaginally as needed (dyspareunia (Use as directed)). Reported on 05/11/2015    . diclofenac sodium (VOLTAREN) 1 % GEL Apply 2 g topically 4 (four) times daily. Need annual appointment for further refills 100 g 0  . furosemide (LASIX) 40 MG tablet Take 1 tab in AM and 1/2 tab in PM 45 tablet 3  . ipratropium (ATROVENT) 0.03 % nasal spray Place 2 sprays into both nostrils 2 (two) times daily.  11  . Magnesium Oxide 500 MG CAPS Take 2,000 mg by mouth daily.    . metoprolol succinate (TOPROL-XL) 50 MG 24 hr tablet Take 1 tablet (50 mg total) by mouth 2 (two) times daily.  60 tablet 3  . potassium chloride SA (K-DUR,KLOR-CON) 20 MEQ tablet Take 2 tablets (40 mEq total) by mouth daily. 60 tablet 6  . sacubitril-valsartan (ENTRESTO) 24-26 MG Take 1 tablet by mouth 2 (two) times daily. 180 tablet 3  . spironolactone (ALDACTONE) 25 MG tablet TAKE 1 TABLET BY MOUTH EVERY EVENING 90 tablet 0  . traMADol (ULTRAM) 50 MG tablet Take 50 mg by mouth every 6 (six) hours as needed for moderate pain. Reported on 10/16/2015    . tretinoin (RETIN-A) 0.1 % cream Apply 1 application topically every morning.     . warfarin (COUMADIN) 2.5 MG tablet TAKE AS DIRECTED BY COUMADIN CLINIC 90 tablet 1  . zolpidem (AMBIEN) 5 MG tablet TAKE 1 TABLET BY MOUTH EVERY DAY AT BEDTIME AS NEEDED 30 tablet 5   No current facility-administered medications for this visit.      Past Medical History:  Diagnosis Date  . AICD (automatic cardioverter/defibrillator) present   . Arthritis    "left ankle" (07/14/2015)  . Atrial fibrillation ([place])   . CHF (congestive heart failure) ([place])   . Colon cancer ([place]) 03/2006   T3, N0  . Colon polyps 12/07/2010  . Coronary artery disease    nonobstructive with 30% D1  . GERD (gastroesophageal reflux disease)   . History of ovarian cancer 1985  .  Hx: UTI (urinary tract infection)   . Hyperlipidemia   . Hypertension   . Internal hemorrhoid   . Nonischemic cardiomyopathy ([place])    last EF assessment 40% by MUGA  . Partial bowel obstruction ([place])   . PVC (premature ventricular contraction)     ROS:   All systems reviewed and negative except as noted in the HPI.   Past Surgical History:  Procedure Laterality Date  . ABDOMINAL HYSTERECTOMY  1979  . ANKLE CLOSED REDUCTION  10/16/2011   Procedure: CLOSED REDUCTION ANKLE;  Surgeon: Tobi Bastos, MD;  Location: [facility];  Service: Orthopedics;  Laterality: Left;  . COLECTOMY  2007   for colon cancer  . COLONOSCOPY N/A 03/11/2013   Procedure: COLONOSCOPY;  Surgeon: Ladene Artist, MD;  Location: [facility]  ENDOSCOPY;  Service: Endoscopy;  Laterality: N/A;  . COLONOSCOPY WITH PROPOFOL N/A 04/21/2015   Procedure: COLONOSCOPY WITH PROPOFOL;  Surgeon: Ladene Artist, MD;  Location: [facility] END[facility];  Service: Endoscopy;  Laterality: N/A;  . EP IMPLANTABLE DEVICE N/A 07/14/2015   Procedure: BiV ICD Insertion CRT-D;  Surgeon: Evans Lance, MD;  Location: [facility];  Service: Cardiovascular;  Laterality: N/A;  . ESOPHAGOGASTRODUODENOSCOPY (EGD) WITH PROPOFOL N/A 04/21/2015   Procedure: ESOPHAGOGASTRODUODENOSCOPY (EGD) WITH PROPOFOL;  Surgeon: Ladene Artist, MD;  Location: [facility];  Service: Endoscopy;  Laterality: N/A;  . FRACTURE SURGERY    . INSERTION OF ICD Left 07/14/2015  . LAPAROTOMY  1980   "adhesions"  . LAPAROTOMY  1989   laparotomy for takedown of intestinal obstruction secondary to adhesions   . OOPHORECTOMY  1961  . OVARY SURGERY Right 1985   resection of right ovarian cancer  . SMALL INTESTINE SURGERY  1985  . TONSILLECTOMY  1944     Family History  Problem Relation Age of Onset  . Uterine cancer Mother   . Prostate cancer Father   . Heart disease Father   . Heart attack Father   . Colon cancer Neg Hx      Social History   Socioeconomic History  . Marital status: Married    Spouse name: Not on file  . Number of children: 2  . Years of education: Not on file  . Highest education level: Not on file  Occupational History  . Occupation: retired  Scientific laboratory technician  . Financial resource strain: Not on file  . Food insecurity:    Worry: Not on file    Inability: Not on file  . Transportation needs:    Medical: Not on file    Non-medical: Not on file  Tobacco Use  . Smoking status: Former Smoker    Packs/day: 0.50    Years: 20.00    Pack years: 10.00    Types: Cigarettes    Last attempt to quit: 04/18/1978    Years since quitting: 39.2  . Smokeless tobacco: Never Used  Substance and Sexual Activity  . Alcohol use: Yes    Alcohol/week: 8.4 oz    Types: 14  Glasses of wine per week    Comment: has wine before dinner   . Drug use: No  . Sexual activity: Not Currently  Lifestyle  . Physical activity:    Days per week: Not on file    Minutes per session: Not on file  . Stress: Not on file  Relationships  . Social connections:    Talks on phone: Not on file    Gets together: Not on file  Attends religious service: Not on file    Active member of club or organization: Not on file    Attends meetings of clubs or organizations: Not on file    Relationship status: Not on file  . Intimate partner violence:    Fear of current or ex partner: Not on file    Emotionally abused: Not on file    Physically abused: Not on file    Forced sexual activity: Not on file  Other Topics Concern  . Not on file  Social History Narrative   Patient had never smoked.   Alcohol use- yes   Daily caffeine use- coffee   Illicit drug use- no   Occupation:retired      BP 118/70   Pulse 60   Ht 5\' 5"  (1.651 m)   Wt 142 lb (64.4 kg)   BMI 23.63 kg/m   Physical Exam:  Well appearing 82 yo woman, NAD HEENT: Unremarkable Neck:  6 cm JVD, no thyromegally Lymphatics:  No adenopathy Back:  No CVA tenderness Lungs:  Clear with no wheezes HEART:  Regular rate rhythm, no murmurs, no rubs, no clicks Abd:  soft, positive bowel sounds, no organomegally, no rebound, no guarding Ext:  2 plus pulses, no edema, no cyanosis, no clubbing Skin:  No rashes no nodules Neuro:  CN II through XII intact, motor grossly intact  EKG - NSR with biv pacing  DEVICE  Normal device function.  See PaceArt for details.   Assess/Plan: 1. Chronic systolic heart failure - she is on maximal medical therapy and her symptoms are class 2A. He will continue his current meds. 2. PAF - she is maintaining NSR over 99% of the time on low dose amiodarone. She will likely eventually need more amio but for now continue the current dose of medicine. 3. ICD - her medtronic BiV ICD is working  normally. Will recheck in several months.  Mikle Bosworth.D.

## 2017-07-21 NOTE — Patient Instructions (Signed)
Medication Instructions:  Your physician recommends that you continue on your current medications as directed. Please refer to the Current Medication list given to you today.  Labwork: None ordered.  Testing/Procedures: None ordered.  Follow-Up: Your physician wants you to follow-up in: one year with Dr. Lovena Le.   You will receive a reminder letter in the mail two months in advance. If you don't receive a letter, please call our office to schedule the follow-up appointment.  Remote monitoring is used to monitor your ICD from home. This monitoring reduces the number of office visits required to check your device to one time per year. It allows [place] to keep an eye on the functioning of your device to ensure it is working properly. You are scheduled for a device check from home on 10/23/2017. You may send your transmission at any time that day. If you have a wireless device, the transmission will be sent automatically. After your physician reviews your transmission, you will receive a postcard with your next transmission date.  Any Other Special Instructions Will Be Listed Below (If Applicable).  If you need a refill on your cardiac medications before your next appointment, please call your pharmacy.

## 2017-07-24 ENCOUNTER — Ambulatory Visit (HOSPITAL_COMMUNITY): Payer: Medicare Other | Attending: Cardiology

## 2017-07-24 ENCOUNTER — Other Ambulatory Visit: Payer: Self-pay

## 2017-07-24 DIAGNOSIS — I428 Other cardiomyopathies: Secondary | ICD-10-CM | POA: Insufficient documentation

## 2017-07-24 DIAGNOSIS — I272 Pulmonary hypertension, unspecified: Secondary | ICD-10-CM | POA: Diagnosis not present

## 2017-07-24 DIAGNOSIS — Z87891 Personal history of nicotine dependence: Secondary | ICD-10-CM | POA: Diagnosis not present

## 2017-07-24 DIAGNOSIS — I4891 Unspecified atrial fibrillation: Secondary | ICD-10-CM | POA: Insufficient documentation

## 2017-07-24 DIAGNOSIS — I5022 Chronic systolic (congestive) heart failure: Secondary | ICD-10-CM | POA: Diagnosis not present

## 2017-07-24 DIAGNOSIS — E785 Hyperlipidemia, unspecified: Secondary | ICD-10-CM | POA: Insufficient documentation

## 2017-07-24 DIAGNOSIS — I080 Rheumatic disorders of both mitral and aortic valves: Secondary | ICD-10-CM | POA: Insufficient documentation

## 2017-07-24 DIAGNOSIS — I110 Hypertensive heart disease with heart failure: Secondary | ICD-10-CM | POA: Insufficient documentation

## 2017-07-24 DIAGNOSIS — I2510 Atherosclerotic heart disease of native coronary artery without angina pectoris: Secondary | ICD-10-CM | POA: Diagnosis not present

## 2017-07-28 NOTE — Addendum Note (Signed)
Addended by: Marlis Edelson C on: 07/28/2017 11:02 AM   Modules accepted: Orders

## 2017-07-31 ENCOUNTER — Ambulatory Visit (INDEPENDENT_AMBULATORY_CARE_PROVIDER_SITE_OTHER): Payer: Medicare Other | Admitting: *Deleted

## 2017-07-31 DIAGNOSIS — Z7901 Long term (current) use of anticoagulants: Secondary | ICD-10-CM

## 2017-07-31 DIAGNOSIS — I480 Paroxysmal atrial fibrillation: Secondary | ICD-10-CM | POA: Diagnosis not present

## 2017-07-31 LAB — POCT INR: INR: 1.7

## 2017-07-31 NOTE — Patient Instructions (Signed)
Description   Take 1.5 tablets today, then change your dose to 1 tablet daily except  1/2 tablet on Tuesdays, Thursdays and Sundays.   Recheck INR in 2 weeks.  Call us with any new medications # [phone].

## 2017-08-02 ENCOUNTER — Other Ambulatory Visit: Payer: Self-pay | Admitting: Internal Medicine

## 2017-08-09 ENCOUNTER — Ambulatory Visit (INDEPENDENT_AMBULATORY_CARE_PROVIDER_SITE_OTHER): Payer: Medicare Other | Admitting: Internal Medicine

## 2017-08-09 ENCOUNTER — Encounter: Payer: Self-pay | Admitting: Internal Medicine

## 2017-08-09 VITALS — BP 122/80 | HR 70 | Temp 98.1°F | Ht 65.0 in | Wt 142.0 lb

## 2017-08-09 DIAGNOSIS — Z0000 Encounter for general adult medical examination without abnormal findings: Secondary | ICD-10-CM

## 2017-08-09 DIAGNOSIS — G4700 Insomnia, unspecified: Secondary | ICD-10-CM

## 2017-08-09 DIAGNOSIS — R4589 Other symptoms and signs involving emotional state: Secondary | ICD-10-CM

## 2017-08-09 MED ORDER — ZOSTER VAC RECOMB ADJUVANTED 50 MCG/0.5ML IM SUSR: 0.5000 mL | Freq: Once | INTRAMUSCULAR | 1 refills | Status: DC

## 2017-08-09 MED ORDER — CITALOPRAM HYDROBROMIDE 20 MG PO TABS: 20.0000 mg | ORAL_TABLET | Freq: Every day | ORAL | 3 refills | Status: DC

## 2017-08-09 NOTE — Progress Notes (Signed)
   Subjective:    Patient ID: Cynthia Frank, female    DOB: [birth year], 82 y.o.   MRN: [id number]  HPI Here for medicare wellness, no new complaints. Please see A/P for status and treatment of chronic medical problems.   HPI #2: Here for follow up of depression (taking celexa with good results, never filled the buspar from long ago, is not sure if she needs to continue taking, denies SI/HI, feels like she gets moody without it), and her insomnia (uses ambien at night time, denies side effects or adverse reaction, usually falls asleep and stays asleep well, denies falls or memory changes).   Diet: heart healthy Physical activity: sedentary Depression/mood screen: negative Hearing: moderate loss bilaterally Visual acuity: grossly normal, performs annual eye exam  ADLs: capable Fall risk: none Home safety: good Cognitive evaluation: intact to orientation, naming, recall and repetition EOL planning: adv directives discussed  I have personally reviewed and have noted 1. The patient's medical and social history - reviewed today no changes 2. Their use of alcohol, tobacco or illicit drugs 3. Their current medications and supplements 4. The patient's functional ability including ADL's, fall risks, home safety risks and hearing or visual impairment. 5. Diet and physical activities 6. Evidence for depression or mood disorders 7. Care team reviewed and updated (available in snapshot)  Review of Systems  Constitutional: Negative.   HENT: Negative.   Eyes: Negative.   Respiratory: Negative for cough, chest tightness and shortness of breath.   Cardiovascular: Negative for chest pain, palpitations and leg swelling.  Gastrointestinal: Negative for abdominal distention, abdominal pain, constipation, diarrhea, nausea and vomiting.  Musculoskeletal: Negative.   Skin: Negative.   Neurological: Negative.   Psychiatric/Behavioral: Negative.       Objective:   Physical Exam  Constitutional:  She is oriented to person, place, and time. She appears well-developed and well-nourished.  HENT:  Head: Normocephalic and atraumatic.  Eyes: EOM are normal.  Neck: Normal range of motion.  Cardiovascular: Normal rate and regular rhythm.  Pulmonary/Chest: Effort normal and breath sounds normal. No respiratory distress. She has no wheezes. She has no rales.  Abdominal: Soft. Bowel sounds are normal. She exhibits no distension. There is no tenderness. There is no rebound.  Musculoskeletal: She exhibits no edema.  Neurological: She is alert and oriented to person, place, and time. Coordination normal.  Skin: Skin is warm and dry.  Psychiatric: She has a normal mood and affect.   Vitals:   08/09/17 0944  BP: 122/80  Pulse: 70  Temp: 98.1 F (36.7 C)  TempSrc: Oral  SpO2: 94%  Weight: 142 lb (64.4 kg)  Height: 5\' 5"  (1.651 m)      Assessment & Plan:

## 2017-08-09 NOTE — Patient Instructions (Signed)
We do not need labs today and have sent in the refills for you.

## 2017-08-11 DIAGNOSIS — R4589 Other symptoms and signs involving emotional state: Secondary | ICD-10-CM | POA: Insufficient documentation

## 2017-08-11 NOTE — Assessment & Plan Note (Signed)
Refill of celexa given. Will remove buspar from the list as she has never taken this.

## 2017-08-11 NOTE — Assessment & Plan Note (Signed)
Flu shot yearly reminded. Pneumonia up to date. Shingrix rx sent in. Colonoscopy up to date will aged up before repeat needed. Tetanus declined Mammogram aged out, pap smear aged out and dexa thinks recent and will get records. Counseled about sun safety and mole surveillance. Counseled about the dangers of distracted driving. Given 10 year screening recommendations.

## 2017-08-11 NOTE — Assessment & Plan Note (Signed)
Taking ambien nightly. Refill as needed until next visit and monitor controlled substance database as needed. Reminded about the potential harms including dependence, memory change, increased risk of falls and she agrees to continue given increase in QOL.

## 2017-08-14 ENCOUNTER — Ambulatory Visit (INDEPENDENT_AMBULATORY_CARE_PROVIDER_SITE_OTHER): Payer: Medicare Other | Admitting: Pharmacist

## 2017-08-14 DIAGNOSIS — I480 Paroxysmal atrial fibrillation: Secondary | ICD-10-CM

## 2017-08-14 LAB — POCT INR: INR: 2.1

## 2017-08-14 NOTE — Patient Instructions (Signed)
Continue taking 1 tablet daily except  1/2 tablet on Tuesdays, Thursdays and Sundays.   Recheck INR in 4 weeks.  Call us with any new medications # [phone].

## 2017-08-20 ENCOUNTER — Other Ambulatory Visit: Payer: Self-pay | Admitting: Cardiology

## 2017-09-12 ENCOUNTER — Ambulatory Visit (INDEPENDENT_AMBULATORY_CARE_PROVIDER_SITE_OTHER): Payer: Medicare Other | Admitting: *Deleted

## 2017-09-12 DIAGNOSIS — Z5181 Encounter for therapeutic drug level monitoring: Secondary | ICD-10-CM

## 2017-09-12 DIAGNOSIS — I480 Paroxysmal atrial fibrillation: Secondary | ICD-10-CM | POA: Diagnosis not present

## 2017-09-12 LAB — POCT INR: INR: 2.6 (ref 2.0–3.0)

## 2017-09-12 NOTE — Patient Instructions (Addendum)
Description   Continue taking 1 tablet daily except  1/2 tablet on Tuesdays, Thursdays and Sundays.   Recheck INR in 5 weeks.  Call us with any new medications # [phone].

## 2017-10-06 ENCOUNTER — Other Ambulatory Visit (HOSPITAL_COMMUNITY): Payer: Self-pay | Admitting: *Deleted

## 2017-10-06 MED ORDER — SPIRONOLACTONE 25 MG PO TABS: 25.0000 mg | ORAL_TABLET | Freq: Every evening | ORAL | 1 refills | Status: DC

## 2017-10-14 ENCOUNTER — Other Ambulatory Visit: Payer: Self-pay | Admitting: Internal Medicine

## 2017-10-16 ENCOUNTER — Ambulatory Visit (INDEPENDENT_AMBULATORY_CARE_PROVIDER_SITE_OTHER): Payer: Medicare Other | Admitting: *Deleted

## 2017-10-16 DIAGNOSIS — Z5181 Encounter for therapeutic drug level monitoring: Secondary | ICD-10-CM | POA: Diagnosis not present

## 2017-10-16 DIAGNOSIS — I480 Paroxysmal atrial fibrillation: Secondary | ICD-10-CM | POA: Diagnosis not present

## 2017-10-16 LAB — POCT INR: INR: 2.2 (ref 2.0–3.0)

## 2017-10-16 NOTE — Patient Instructions (Signed)
Description   Continue taking 1 tablet daily except 1/2 tablet on Tuesdays, Thursdays and Sundays. Recheck INR in 6 weeks.  Call us with any new medications # [phone].      

## 2017-10-23 ENCOUNTER — Ambulatory Visit (INDEPENDENT_AMBULATORY_CARE_PROVIDER_SITE_OTHER): Payer: Medicare Other | Admitting: *Deleted

## 2017-10-23 DIAGNOSIS — I428 Other cardiomyopathies: Secondary | ICD-10-CM | POA: Diagnosis not present

## 2017-10-23 DIAGNOSIS — I5022 Chronic systolic (congestive) heart failure: Secondary | ICD-10-CM

## 2017-10-23 NOTE — Progress Notes (Signed)
Remote ICD transmission.   

## 2017-10-27 LAB — CUP PACEART REMOTE DEVICE CHECK
Battery Remaining Longevity: 53 mo
Battery Voltage: 2.97 V
Brady Statistic AP VP Percent: 88.42 %
Brady Statistic AP VS Percent: 0.53 %
Brady Statistic AS VP Percent: 7.71 %
Brady Statistic AS VS Percent: 3.34 %
Brady Statistic RA Percent Paced: 88.41 %
Brady Statistic RV Percent Paced: 95.53 %
Date Time Interrogation Session: 20190708041705
HighPow Impedance: 68 Ohm
Implantable Lead Implant Date: 20170328
Implantable Lead Implant Date: 20170328
Implantable Lead Implant Date: 20170328
Implantable Lead Location: 753858
Implantable Lead Location: 753859
Implantable Lead Location: 753860
Implantable Lead Model: 4598
Implantable Lead Model: 5076
Implantable Lead Model: 6935
Implantable Pulse Generator Implant Date: 20170328
Lead Channel Impedance Value: 304 Ohm
Lead Channel Impedance Value: 304 Ohm
Lead Channel Impedance Value: 361 Ohm
Lead Channel Impedance Value: 361 Ohm
Lead Channel Impedance Value: 418 Ohm
Lead Channel Impedance Value: 513 Ohm
Lead Channel Impedance Value: 513 Ohm
Lead Channel Impedance Value: 532 Ohm
Lead Channel Impedance Value: 551 Ohm
Lead Channel Impedance Value: 608 Ohm
Lead Channel Impedance Value: 817 Ohm
Lead Channel Impedance Value: 817 Ohm
Lead Channel Impedance Value: 836 Ohm
Lead Channel Pacing Threshold Amplitude: 0.75 V
Lead Channel Pacing Threshold Amplitude: 1 V
Lead Channel Pacing Threshold Amplitude: 1.125 V
Lead Channel Pacing Threshold Pulse Width: 0.4 ms
Lead Channel Pacing Threshold Pulse Width: 0.4 ms
Lead Channel Pacing Threshold Pulse Width: 0.4 ms
Lead Channel Sensing Intrinsic Amplitude: 1.75 mV
Lead Channel Sensing Intrinsic Amplitude: 1.75 mV
Lead Channel Sensing Intrinsic Amplitude: 11.75 mV
Lead Channel Sensing Intrinsic Amplitude: 11.75 mV
Lead Channel Setting Pacing Amplitude: 2.25 V
Lead Channel Setting Pacing Amplitude: 2.25 V
Lead Channel Setting Pacing Amplitude: 2.5 V
Lead Channel Setting Pacing Pulse Width: 0.4 ms
Lead Channel Setting Pacing Pulse Width: 0.4 ms
Lead Channel Setting Sensing Sensitivity: 0.3 mV

## 2017-10-30 DIAGNOSIS — Z1231 Encounter for screening mammogram for malignant neoplasm of breast: Secondary | ICD-10-CM | POA: Diagnosis not present

## 2017-10-31 ENCOUNTER — Other Ambulatory Visit (HOSPITAL_COMMUNITY): Payer: Self-pay | Admitting: Cardiology

## 2017-11-03 ENCOUNTER — Encounter: Payer: Self-pay | Admitting: Internal Medicine

## 2017-11-03 DIAGNOSIS — R928 Other abnormal and inconclusive findings on diagnostic imaging of breast: Secondary | ICD-10-CM | POA: Diagnosis not present

## 2017-11-03 LAB — HM MAMMOGRAPHY

## 2017-11-07 ENCOUNTER — Encounter: Payer: Self-pay | Admitting: Internal Medicine

## 2017-11-07 NOTE — Progress Notes (Signed)
Abstracted and sent to scan  

## 2017-11-14 ENCOUNTER — Emergency Department (HOSPITAL_COMMUNITY): Payer: Medicare Other

## 2017-11-14 ENCOUNTER — Encounter (HOSPITAL_COMMUNITY): Payer: Self-pay | Admitting: Emergency Medicine

## 2017-11-14 ENCOUNTER — Emergency Department (HOSPITAL_COMMUNITY)
Admission: EM | Admit: 2017-11-14 | Discharge: 2017-11-14 | Disposition: A | Discharge status: Home / Self Care | Payer: Medicare Other | Attending: Emergency Medicine | Admitting: Emergency Medicine

## 2017-11-14 DIAGNOSIS — I2510 Atherosclerotic heart disease of native coronary artery without angina pectoris: Secondary | ICD-10-CM | POA: Insufficient documentation

## 2017-11-14 DIAGNOSIS — I5022 Chronic systolic (congestive) heart failure: Secondary | ICD-10-CM | POA: Diagnosis not present

## 2017-11-14 DIAGNOSIS — R55 Syncope and collapse: Secondary | ICD-10-CM

## 2017-11-14 DIAGNOSIS — Z7901 Long term (current) use of anticoagulants: Secondary | ICD-10-CM | POA: Insufficient documentation

## 2017-11-14 DIAGNOSIS — I110 Hypertensive heart disease with heart failure: Secondary | ICD-10-CM | POA: Insufficient documentation

## 2017-11-14 DIAGNOSIS — S12400A Unspecified displaced fracture of fifth cervical vertebra, initial encounter for closed fracture: Secondary | ICD-10-CM | POA: Diagnosis not present

## 2017-11-14 DIAGNOSIS — S199XXA Unspecified injury of neck, initial encounter: Secondary | ICD-10-CM | POA: Diagnosis present

## 2017-11-14 DIAGNOSIS — S12401A Unspecified nondisplaced fracture of fifth cervical vertebra, initial encounter for closed fracture: Secondary | ICD-10-CM | POA: Diagnosis not present

## 2017-11-14 DIAGNOSIS — Z87891 Personal history of nicotine dependence: Secondary | ICD-10-CM | POA: Diagnosis not present

## 2017-11-14 DIAGNOSIS — S0083XA Contusion of other part of head, initial encounter: Secondary | ICD-10-CM | POA: Diagnosis not present

## 2017-11-14 DIAGNOSIS — Z23 Encounter for immunization: Secondary | ICD-10-CM | POA: Diagnosis not present

## 2017-11-14 DIAGNOSIS — Y9389 Activity, other specified: Secondary | ICD-10-CM | POA: Insufficient documentation

## 2017-11-14 DIAGNOSIS — Z79899 Other long term (current) drug therapy: Secondary | ICD-10-CM | POA: Diagnosis not present

## 2017-11-14 DIAGNOSIS — W010XXA Fall on same level from slipping, tripping and stumbling without subsequent striking against object, initial encounter: Secondary | ICD-10-CM | POA: Diagnosis not present

## 2017-11-14 DIAGNOSIS — Y999 Unspecified external cause status: Secondary | ICD-10-CM | POA: Insufficient documentation

## 2017-11-14 DIAGNOSIS — S0990XA Unspecified injury of head, initial encounter: Secondary | ICD-10-CM | POA: Diagnosis not present

## 2017-11-14 DIAGNOSIS — Y929 Unspecified place or not applicable: Secondary | ICD-10-CM | POA: Insufficient documentation

## 2017-11-14 DIAGNOSIS — M8448XA Pathological fracture, other site, initial encounter for fracture: Secondary | ICD-10-CM | POA: Diagnosis not present

## 2017-11-14 LAB — CBC
HCT: 36.1 % (ref 36.0–46.0)
Hemoglobin: 11.6 g/dL — ABNORMAL LOW (ref 12.0–15.0)
MCH: 31.9 pg (ref 26.0–34.0)
MCHC: 32.1 g/dL (ref 30.0–36.0)
MCV: 99.2 fL (ref 78.0–100.0)
Platelets: 141 10*3/uL — ABNORMAL LOW (ref 150–400)
RBC: 3.64 MIL/uL — ABNORMAL LOW (ref 3.87–5.11)
RDW: 14 % (ref 11.5–15.5)
WBC: 9.4 10*3/uL (ref 4.0–10.5)

## 2017-11-14 LAB — BASIC METABOLIC PANEL
Anion gap: 12 (ref 5–15)
BUN: 20 mg/dL (ref 8–23)
CO2: 26 mmol/L (ref 22–32)
Calcium: 9 mg/dL (ref 8.9–10.3)
Chloride: 97 mmol/L — ABNORMAL LOW (ref 98–111)
Creatinine, Ser: 1.25 mg/dL — ABNORMAL HIGH (ref 0.44–1.00)
GFR calc Af Amer: 46 mL/min — ABNORMAL LOW (ref >60)
GFR calc non Af Amer: 40 mL/min — ABNORMAL LOW (ref >60)
Glucose, Bld: 99 mg/dL (ref 70–99)
Potassium: 3.8 mmol/L (ref 3.5–5.1)
Sodium: 135 mmol/L (ref 135–145)

## 2017-11-14 LAB — URINALYSIS, ROUTINE W REFLEX MICROSCOPIC
Bilirubin Urine: NEGATIVE
Glucose, UA: NEGATIVE mg/dL
Hgb urine dipstick: NEGATIVE
Ketones, ur: NEGATIVE mg/dL
Leukocytes, UA: NEGATIVE
Nitrite: NEGATIVE
Protein, ur: NEGATIVE mg/dL
Specific Gravity, Urine: 1.015 (ref 1.005–1.030)
pH: 6 (ref 5.0–8.0)

## 2017-11-14 LAB — APTT: aPTT: 52 seconds — ABNORMAL HIGH (ref 24–36)

## 2017-11-14 LAB — PROTIME-INR
INR: 2.52
Prothrombin Time: 26.9 seconds — ABNORMAL HIGH (ref 11.4–15.2)

## 2017-11-14 LAB — I-STAT TROPONIN, ED: Troponin i, poc: 0.05 ng/mL (ref 0.00–0.08)

## 2017-11-14 MED ORDER — IOPAMIDOL (ISOVUE-370) INJECTION 76%
50.0000 mL | Freq: Once | INTRAVENOUS | Status: AC | PRN
  Administered 2017-11-14: 50 mL via INTRAVENOUS

## 2017-11-14 MED ORDER — IOPAMIDOL (ISOVUE-370) INJECTION 76%
INTRAVENOUS | Status: AC
  Filled 2017-11-14: qty 50

## 2017-11-14 MED ORDER — TETANUS-DIPHTH-ACELL PERTUSSIS 5-2.5-18.5 LF-MCG/0.5 IM SUSP
0.5000 mL | Freq: Once | INTRAMUSCULAR | Status: AC
  Administered 2017-11-14: 0.5 mL via INTRAMUSCULAR
  Filled 2017-11-14: qty 0.5

## 2017-11-14 NOTE — ED Provider Notes (Signed)
Medical screening examination/treatment/procedure(s) were conducted as a shared visit with non-physician practitioner(s) and myself.  I personally evaluated the patient during the encounter.  EKG Interpretation  Date/Time:  Tuesday November 14 2017 16:42:27 EDT Ventricular Rate:  63 PR Interval:  230 QRS Duration: 106 QT Interval:  454 QTC Calculation: 464 R Axis:   -80 Text Interpretation:  Atrial-sensed ventricular-paced rhythm with prolonged AV conduction Abnormal ECG agree. no change Confirmed by Charlesetta Shanks [phone]) on 11/14/2017 11:20:01 PM  Patient episode of passing out 2 days prior to presentation.  She has been having dizziness with standing.  He has AICD and has not had any shock event.  She reports she had problems with syncope in the past and had evaluation.  She reports that correlates very highly with position change and movements.  The patient's fall 2 days ago she has severe bruising and swelling of the left side of the face.  She reports is uncomfortable but denies severe pain.  Patient is alert and appropriate.  Mental status clear.  A large amount of ecchymosis to the left side of the face.  Ocular motions normal.  Heart regular.  No gross rub murmur gallop.  Lungs grossly clear.  Patient is neurologically intact.  Incidentally identified is a L5 facet fracture.  Patient did not have significant pain in this area.  Consultation was made with neurosurgery by PA-C couture.  Patient otherwise is in excellent condition.  She describes frequent problems with orthostatic hypotension and near syncope.  Patient much prefers discharge to hospital observation.  I feel this is reasonable, the patient is aware of the underlying problem and care in managing position changes and lightheadedness.  Return precautions reviewed.   Charlesetta Shanks, MD 11/20/17 1331

## 2017-11-14 NOTE — ED Triage Notes (Signed)
Pt states she had a syncopal episode on Sunday evening. Pt hit the left side of her face- extensive bruising to left temple and left cheek. Pt has knot on left temple as well. Pt alert and oriented. Pt states she takes coumadin. Denies CP/SOB.

## 2017-11-14 NOTE — ED Provider Notes (Signed)
Patient placed in Quick Look pathway, seen and evaluated   Chief Complaint: fall on coumadin  HPI:   Pt fell on Sunday. This happened after she stood up, she had a syncopal episode. Immediately regained consciousness. Has episodes of near syncope upon standing since she has been on BP medications. Denies CP or SOB prior to the fall. She takes coumadin. She denies HA, vision changes, difficulty moving her eyes, trismus, difficulty chewing. She has some neck soreness. Soreness of her L knee, pain has improved since Sunday. She is here today because her friends told her she needed to be checked out.   ROS: fall on coumadin  Physical Exam:   Gen: No distress  Neuro: Awake and Alert. No obvious neurologic deficits. EOMI and PERRLA. No nystagmus. No bulging. No entrapment. significant bruising to the L side face with hematoma on L temple/forehead.   Skin: Warm    Focused Exam: CTAB and RRR   Initiation of care has begun. The patient has been counseled on the process, plan, and necessity for staying for the completion/evaluation, and the remainder of the medical screening examination    Franchot Heidelberg, PA-C 11/14/17 1657    Hayden Rasmussen, MD 11/15/17 1208

## 2017-11-14 NOTE — ED Notes (Signed)
Patient transported to X-ray 

## 2017-11-14 NOTE — Discharge Instructions (Addendum)
Please wear the cervical collar as instructed. Please follow up with neurosurgery in 1-2 weeks for re-evaluation. Please follow up with your cardiologist in regards to your visit in the emergency room today.   Please follow up with your primary care provider within 5-7 days for re-evaluation of your symptoms. Please return to the emergency department for any new or worsening symptoms.

## 2017-11-14 NOTE — ED Provider Notes (Signed)
[facility] EMERGENCY DEPARTMENT Provider Note   CSN: [id number] Arrival date & time: 11/14/17  1626     History   Chief Complaint Chief Complaint  Patient presents with  . Loss of Consciousness    HPI Cynthia Frank is a 82 y.o. female.  HPI   Patient is a 82 year old female with a history of A. fib, CHF, nonischemic cardiomyopathy, hyperlipidemia, hypertension, CAD, AICD who presents emergency department today for evaluation after syncopal episode and fall that occurred 2 days ago.  Patient states that she intermittently feels dizzy when she stands up ever since she started on blood pressure medications.  Two days ago she stood up from a sitting position and had a syncopal episode and fell to the floor.  Denies any shock from her AICD.  Denies any preceding chest pain, shortness of breath, palpitations.  Denies any of these symptoms following her syncopal episode.  Denies any headaches, vision changes, nausea, vomiting. She is c/o right sided neck pain and some mild left knee pain. Also reports bruising to the left side of her face but no significant pain to her face. No numbness or weakness to the arms or legs. No loss of control of bowels or bladder function. No urinary retention.   States she is on Coumadin.  Past Medical History:  Diagnosis Date  . AICD (automatic cardioverter/defibrillator) present   . Arthritis    "left ankle" (07/14/2015)  . Atrial fibrillation ([place])   . CHF (congestive heart failure) ([place])   . Colon cancer ([place]) 03/2006   T3, N0  . Colon polyps 12/07/2010  . Coronary artery disease    nonobstructive with 30% D1  . GERD (gastroesophageal reflux disease)   . History of ovarian cancer 1985  . Hx: UTI (urinary tract infection)   . Hyperlipidemia   . Hypertension   . Internal hemorrhoid   . Nonischemic cardiomyopathy ([place])    last EF assessment 40% by MUGA  . Partial bowel obstruction ([place])   . PVC (premature ventricular  contraction)     Patient Active Problem List   Diagnosis Date Noted  . Dysphoric mood 08/11/2017  . Insomnia 04/21/2016  . Arthritis 04/21/2016  . Chronic systolic CHF (congestive heart failure) ([place]) 06/03/2015  . GERD (gastroesophageal reflux disease) 04/21/2015  . History of colon cancer   . Routine general medical examination at a health care facility 03/24/2015  . Nonischemic cardiomyopathy ([place])   . Coronary artery disease   . Hyperlipidemia   . Atrial fibrillation ([place]) 12/11/2012  . THYROID NODULE 04/27/2007  . Essential hypertension 04/27/2007    Past Surgical History:  Procedure Laterality Date  . ABDOMINAL HYSTERECTOMY  1979  . ANKLE CLOSED REDUCTION  10/16/2011   Procedure: CLOSED REDUCTION ANKLE;  Surgeon: Tobi Bastos, MD;  Location: [facility];  Service: Orthopedics;  Laterality: Left;  . COLECTOMY  2007   for colon cancer  . COLONOSCOPY N/A 03/11/2013   Procedure: COLONOSCOPY;  Surgeon: Ladene Artist, MD;  Location: [facility];  Service: Endoscopy;  Laterality: N/A;  . COLONOSCOPY WITH PROPOFOL N/A 04/21/2015   Procedure: COLONOSCOPY WITH PROPOFOL;  Surgeon: Ladene Artist, MD;  Location: [facility];  Service: Endoscopy;  Laterality: N/A;  . EP IMPLANTABLE DEVICE N/A 07/14/2015   Procedure: BiV ICD Insertion CRT-D;  Surgeon: Evans Lance, MD;  Location: [facility];  Service: Cardiovascular;  Laterality: N/A;  . ESOPHAGOGASTRODUODENOSCOPY (EGD) WITH PROPOFOL N/A 04/21/2015   Procedure: ESOPHAGOGASTRODUODENOSCOPY (EGD) WITH  PROPOFOL;  Surgeon: Ladene Artist, MD;  Location: [facility];  Service: Endoscopy;  Laterality: N/A;  . FRACTURE SURGERY    . INSERTION OF ICD Left 07/14/2015  . LAPAROTOMY  1980   "adhesions"  . LAPAROTOMY  1989   laparotomy for takedown of intestinal obstruction secondary to adhesions   . OOPHORECTOMY  1961  . OVARY SURGERY Right 1985   resection of right ovarian cancer  . SMALL INTESTINE SURGERY  1985  . TONSILLECTOMY   1944     OB History   None      Home Medications    Prior to Admission medications   Medication Sig Start Date End Date Taking? Authorizing Provider  amiodarone (PACERONE) 200 MG tablet Take 0.5 tablets (100 mg total) daily by mouth. 03/06/17   Bensimhon, Shaune Pascal, MD  atorvastatin (LIPITOR) 40 MG tablet Take 1 tablet (40 mg total) by mouth daily. 10/16/17   Evans Lance, MD  citalopram (CELEXA) 20 MG tablet Take 1 tablet (20 mg total) by mouth daily. 08/09/17   [name], MD  Coenzyme Q10 (COQ10) 100 MG CAPS Take 100 mg by mouth daily.     [provider]  conjugated estrogens (PREMARIN) vaginal cream Place 1 Applicatorful vaginally as needed (dyspareunia (Use as directed)). Reported on 05/11/2015    [provider]  diclofenac sodium (VOLTAREN) 1 % GEL APPLY 2 GRAMS TOPICALLY 4 TIMES DAILY 10/16/17   [name], MD  furosemide (LASIX) 40 MG tablet TAKE 1 TABLET BY MOUTH IN THE MORNING AND 1/2 TABLET IN THE EVENING 10/31/17   Larey Dresser, MD  ipratropium (ATROVENT) 0.03 % nasal spray Place 2 sprays into both nostrils 2 (two) times daily. 05/19/15   [provider]  Magnesium Oxide 500 MG CAPS Take 2,000 mg by mouth daily.    [provider]  metoprolol succinate (TOPROL-XL) 50 MG 24 hr tablet Take 1 tablet (50 mg total) by mouth 2 (two) times daily. Patient taking differently: Take 50 mg by mouth 2 (two) times daily.  06/27/17   Larey Dresser, MD  potassium chloride SA (K-DUR,KLOR-CON) 20 MEQ tablet TAKE 2 TABLETS BY MOUTH DAILY 10/31/17   Larey Dresser, MD  sacubitril-valsartan (ENTRESTO) 24-26 MG Take 1 tablet by mouth 2 (two) times daily. 06/28/17   Larey Dresser, MD  spironolactone (ALDACTONE) 25 MG tablet Take 1 tablet (25 mg total) by mouth every evening. 10/06/17   Larey Dresser, MD  traMADol (ULTRAM) 50 MG tablet Take 50 mg by mouth every 6 (six) hours as needed for moderate pain. Reported on 10/16/2015    [provider]  tretinoin (RETIN-A) 0.1 % cream Apply 1 application topically every morning.     [provider]  warfarin (COUMADIN) 2.5 MG tablet TAKE AS DIRECTED BY COUMADIN CLINIC 08/21/17   Sueanne Margarita, MD  zolpidem (AMBIEN) 5 MG tablet TAKE 1 TABLET BY MOUTH EVERY DAY AT BEDTIME AS NEEDED 05/29/17   [name], MD    Family History Family History  Problem Relation Age of Onset  . Uterine cancer Mother   . Prostate cancer Father   . Heart disease Father   . Heart attack Father   . Colon cancer Neg Hx     Social History Social History   Tobacco Use  . Smoking status: Former Smoker    Packs/day: 0.50    Years: 20.00    Pack years: 10.00    Types: Cigarettes  Last attempt to quit: 04/18/1978    Years since quitting: 39.6  . Smokeless tobacco: Never Used  Substance Use Topics  . Alcohol use: Yes    Alcohol/week: 8.4 oz    Types: 14 Glasses of wine per week    Comment: has wine before dinner   . Drug use: No     Allergies   Clindamycin/lincomycin; Pneumococcal vaccines; and Sulfonamide derivatives   Review of Systems Review of Systems  Constitutional: Negative for fever.  HENT: Negative for congestion.   Eyes: Negative for visual disturbance.  Respiratory: Negative for shortness of breath.   Cardiovascular: Negative for chest pain and leg swelling.  Gastrointestinal: Negative for abdominal pain, nausea and vomiting.  Genitourinary: Negative for dysuria.  Musculoskeletal: Positive for neck pain. Negative for back pain.       Knee pain  Skin: Positive for wound.  Neurological: Positive for syncope. Negative for weakness, numbness and headaches.       Intermittently lightheaded and dizzy upon standing   Physical Exam Updated Vital Signs BP 113/70   Pulse (!) 59   Temp 98.3 F (36.8 C) (Oral)   Resp 11   SpO2 95%   Physical Exam  Constitutional: She is oriented to person, place, and time. She appears well-developed and well-nourished.  No distress.  HENT:  Ecchymosis to the left side of the face with minimal TTP. No stepoff noted. No trismus. Moist mucous membranes.   Eyes: Pupils are equal, round, and reactive to light. Conjunctivae and EOM are normal.  No nystagmus, no entrapment, no pain with EOM  Neck: Neck supple. No tracheal deviation present.  No cervical spine TTP. TTP to the right cervical paraspinous muscles.   Cardiovascular: Normal rate, regular rhythm, normal heart sounds and intact distal pulses.  No murmur heard. No carotid bruits bilaterally  Pulmonary/Chest: Effort normal and breath sounds normal. No respiratory distress.  Abdominal: Soft. There is no tenderness.  Musculoskeletal:  Left ankle swelling (chronic)  Neurological: She is alert and oriented to person, place, and time.  Mental Status:  Alert, thought content appropriate, able to give a coherent history. Speech fluent without evidence of aphasia. Able to follow 2 step commands without difficulty.  Cranial Nerves:  II: pupils equal, round, reactive to light III,IV, VI: ptosis not present, extra-ocular motions intact bilaterally  V,VII: smile symmetric, facial light touch sensation equal VIII: hearing grossly normal to voice  X: uvula elevates symmetrically  XI: bilateral shoulder shrug symmetric and strong XII: midline tongue extension without fassiculations Motor:  Normal tone. 5/5 strength of BUE and BLE major muscle groups including strong and equal grip strength and dorsiflexion/plantar flexion Sensory: light touch normal in all extremities. DTRs: biceps and achilles 2+ symmetric b/l Cerebellar: normal finger-to-nose with bilateral upper extremities CV: 2+ radial and DP/PT pulses  Skin: Skin is warm and dry.  Psychiatric: She has a normal mood and affect.  Nursing note and vitals reviewed.  ED Treatments / Results  Labs (all labs ordered are listed, but only abnormal results are displayed) Labs Reviewed  BASIC METABOLIC PANEL -  Abnormal; Notable for the following components:      Result Value   Chloride 97 (*)    Creatinine, Ser 1.25 (*)    GFR calc non Af Amer 40 (*)    GFR calc Af Amer 46 (*)    All other components within normal limits  CBC - Abnormal; Notable for the following components:   RBC 3.64 (*)    Hemoglobin  11.6 (*)    Platelets 141 (*)    All other components within normal limits  PROTIME-INR - Abnormal; Notable for the following components:   Prothrombin Time 26.9 (*)    All other components within normal limits  APTT - Abnormal; Notable for the following components:   aPTT 52 (*)    All other components within normal limits  URINALYSIS, ROUTINE W REFLEX MICROSCOPIC  I-STAT TROPONIN, ED  CBG MONITORING, ED    EKG EKG Interpretation  Date/Time:  Tuesday November 14 2017 16:42:27 EDT Ventricular Rate:  63 PR Interval:  230 QRS Duration: 106 QT Interval:  454 QTC Calculation: 464 R Axis:   -80 Text Interpretation:  Atrial-sensed ventricular-paced rhythm with prolonged AV conduction Abnormal ECG agree. no change Confirmed by Charlesetta Shanks [phone]) on 11/14/2017 11:20:01 PM   Radiology Ct Head Wo Contrast  Result Date: 11/14/2017 CLINICAL DATA:  Syncopal episode 2 days ago. Hit left side of face with bruising. Patient on Coumadin. EXAM: CT HEAD WITHOUT CONTRAST CT MAXILLOFACIAL WITHOUT CONTRAST CT CERVICAL SPINE WITHOUT CONTRAST TECHNIQUE: Multidetector CT imaging of the head, cervical spine, and maxillofacial structures were performed using the standard protocol without intravenous contrast. Multiplanar CT image reconstructions of the cervical spine and maxillofacial structures were also generated. COMPARISON:  None. FINDINGS: CT HEAD FINDINGS Brain: No subdural, epidural, or subarachnoid hemorrhage. Cerebellum, brainstem, and basal cisterns are normal. Sulci are mildly prominent consistent with mild volume loss. Ventricles are normal. No mass effect or midline shift. No evidence of acute  cortical ischemia or infarct. Vascular: Calcified atherosclerosis in the intracranial carotids. Skull: Normal. Negative for fracture or focal lesion. Other: There is a hematoma over the low left lateral scalp anteriorly. No other soft tissue abnormalities. The globes are intact. CT MAXILLOFACIAL FINDINGS Osseous: No fracture or mandibular dislocation. No destructive process. Orbits: Negative. No traumatic or inflammatory finding. Sinuses: Clear. Soft tissues: Soft tissue swelling over the left side of the face with a hematoma over the left lateral anterior scalp. Extracranial soft tissues otherwise normal. The globes are intact. CT CERVICAL SPINE FINDINGS Alignment: There is reversal of normal lordosis. No traumatic malalignment. Skull base and vertebrae: There is a fracture involving the right C5 facet and lamina. There appears to be involvement of the adjacent vertebral artery foramen posteriorly. No other fractures identified. Soft tissues and spinal canal: Thyroid nodularity with the largest thyroid on the left measuring just less than 10 mm. No follow-up necessary. Disc levels: Multilevel degenerative disc disease and facet degenerative changes. Upper chest: Negative. Other: No other abnormalities. IMPRESSION: 1. No acute intracranial abnormalities. 2. No facial bone fractures.  Swelling over the left side of face. 3. Fracture through the right C5 facet and lamina without malalignment. The fracture appears to extend into the posterior aspect of the vertebral artery foramen at this level. Recommend a CTA to further evaluate the vertebral artery at the C5 level. Findings called to Marvia Pickles, PA Electronically Signed   By: Dorise Bullion III M.D   On: 11/14/2017 18:46   Ct Angio Neck W And/or Wo Contrast  Result Date: 11/14/2017 CLINICAL DATA:  Cervical spine fracture. EXAM: CT ANGIOGRAPHY NECK TECHNIQUE: Multidetector CT imaging of the neck was performed using the standard protocol during bolus  administration of intravenous contrast. Multiplanar CT image reconstructions and MIPs were obtained to evaluate the vascular anatomy. Carotid stenosis measurements (when applicable) are obtained utilizing NASCET criteria, using the distal internal carotid diameter as the denominator. CONTRAST:  23mL ISOVUE-370 IOPAMIDOL (ISOVUE-370)  INJECTION 76% COMPARISON:  CT cervical spine 11/14/2017 FINDINGS: Aortic arch: There is no calcific atherosclerosis of the aortic arch. There is no aneurysm, dissection or hemodynamically significant stenosis of the visualized ascending aorta and aortic arch. Conventional 3 vessel aortic branching pattern. The visualized proximal subclavian arteries are widely patent. Right carotid system: --Common carotid artery: Widely patent origin without common carotid artery dissection or aneurysm. --Internal carotid artery: No dissection, occlusion or aneurysm. No hemodynamically significant stenosis. --External carotid artery: No acute abnormality. Left carotid system: --Common carotid artery: Widely patent origin without common carotid artery dissection or aneurysm. --Internal carotid artery:No dissection, occlusion or aneurysm. No hemodynamically significant stenosis. --External carotid artery: No acute abnormality. Vertebral arteries: Codominant configuration. Both origins are normal. No dissection, occlusion or flow-limiting stenosis to the vertebrobasilar confluence. The right vertebral artery is normal at C5 level. There is a small branch vessel arising from the vertebral artery at this level, traveling posteriorly. Skeleton: Redemonstration of right transverse process fracture at C5. Other neck: Normal pharynx, larynx and major salivary glands. No cervical lymphadenopathy. Unremarkable thyroid gland. Upper chest: No pneumothorax or pleural effusion. No nodules or masses. Review of the MIP images confirms the above findings IMPRESSION: 1. No vertebral artery dissection. 2. Redemonstration  of right C5 transverse process fracture. Electronically Signed   By: Ulyses Jarred M.D.   On: 11/14/2017 21:41   Ct Cervical Spine Wo Contrast  Result Date: 11/14/2017 CLINICAL DATA:  Syncopal episode 2 days ago. Hit left side of face with bruising. Patient on Coumadin. EXAM: CT HEAD WITHOUT CONTRAST CT MAXILLOFACIAL WITHOUT CONTRAST CT CERVICAL SPINE WITHOUT CONTRAST TECHNIQUE: Multidetector CT imaging of the head, cervical spine, and maxillofacial structures were performed using the standard protocol without intravenous contrast. Multiplanar CT image reconstructions of the cervical spine and maxillofacial structures were also generated. COMPARISON:  None. FINDINGS: CT HEAD FINDINGS Brain: No subdural, epidural, or subarachnoid hemorrhage. Cerebellum, brainstem, and basal cisterns are normal. Sulci are mildly prominent consistent with mild volume loss. Ventricles are normal. No mass effect or midline shift. No evidence of acute cortical ischemia or infarct. Vascular: Calcified atherosclerosis in the intracranial carotids. Skull: Normal. Negative for fracture or focal lesion. Other: There is a hematoma over the low left lateral scalp anteriorly. No other soft tissue abnormalities. The globes are intact. CT MAXILLOFACIAL FINDINGS Osseous: No fracture or mandibular dislocation. No destructive process. Orbits: Negative. No traumatic or inflammatory finding. Sinuses: Clear. Soft tissues: Soft tissue swelling over the left side of the face with a hematoma over the left lateral anterior scalp. Extracranial soft tissues otherwise normal. The globes are intact. CT CERVICAL SPINE FINDINGS Alignment: There is reversal of normal lordosis. No traumatic malalignment. Skull base and vertebrae: There is a fracture involving the right C5 facet and lamina. There appears to be involvement of the adjacent vertebral artery foramen posteriorly. No other fractures identified. Soft tissues and spinal canal: Thyroid nodularity with the  largest thyroid on the left measuring just less than 10 mm. No follow-up necessary. Disc levels: Multilevel degenerative disc disease and facet degenerative changes. Upper chest: Negative. Other: No other abnormalities. IMPRESSION: 1. No acute intracranial abnormalities. 2. No facial bone fractures.  Swelling over the left side of face. 3. Fracture through the right C5 facet and lamina without malalignment. The fracture appears to extend into the posterior aspect of the vertebral artery foramen at this level. Recommend a CTA to further evaluate the vertebral artery at the C5 level. Findings called to Marvia Pickles, PA Electronically Signed  By: Dorise Bullion III M.D   On: 11/14/2017 18:46   Ct Maxillofacial Wo Contrast  Result Date: 11/14/2017 CLINICAL DATA:  Syncopal episode 2 days ago. Hit left side of face with bruising. Patient on Coumadin. EXAM: CT HEAD WITHOUT CONTRAST CT MAXILLOFACIAL WITHOUT CONTRAST CT CERVICAL SPINE WITHOUT CONTRAST TECHNIQUE: Multidetector CT imaging of the head, cervical spine, and maxillofacial structures were performed using the standard protocol without intravenous contrast. Multiplanar CT image reconstructions of the cervical spine and maxillofacial structures were also generated. COMPARISON:  None. FINDINGS: CT HEAD FINDINGS Brain: No subdural, epidural, or subarachnoid hemorrhage. Cerebellum, brainstem, and basal cisterns are normal. Sulci are mildly prominent consistent with mild volume loss. Ventricles are normal. No mass effect or midline shift. No evidence of acute cortical ischemia or infarct. Vascular: Calcified atherosclerosis in the intracranial carotids. Skull: Normal. Negative for fracture or focal lesion. Other: There is a hematoma over the low left lateral scalp anteriorly. No other soft tissue abnormalities. The globes are intact. CT MAXILLOFACIAL FINDINGS Osseous: No fracture or mandibular dislocation. No destructive process. Orbits: Negative. No traumatic  or inflammatory finding. Sinuses: Clear. Soft tissues: Soft tissue swelling over the left side of the face with a hematoma over the left lateral anterior scalp. Extracranial soft tissues otherwise normal. The globes are intact. CT CERVICAL SPINE FINDINGS Alignment: There is reversal of normal lordosis. No traumatic malalignment. Skull base and vertebrae: There is a fracture involving the right C5 facet and lamina. There appears to be involvement of the adjacent vertebral artery foramen posteriorly. No other fractures identified. Soft tissues and spinal canal: Thyroid nodularity with the largest thyroid on the left measuring just less than 10 mm. No follow-up necessary. Disc levels: Multilevel degenerative disc disease and facet degenerative changes. Upper chest: Negative. Other: No other abnormalities. IMPRESSION: 1. No acute intracranial abnormalities. 2. No facial bone fractures.  Swelling over the left side of face. 3. Fracture through the right C5 facet and lamina without malalignment. The fracture appears to extend into the posterior aspect of the vertebral artery foramen at this level. Recommend a CTA to further evaluate the vertebral artery at the C5 level. Findings called to Marvia Pickles, PA Electronically Signed   By: Dorise Bullion III M.D   On: 11/14/2017 18:46    Procedures Procedures (including critical care time)  Medications Ordered in ED Medications  iopamidol (ISOVUE-370) 76 % injection (has no administration in time range)  Tdap (BOOSTRIX) injection 0.5 mL (0.5 mLs Intramuscular Given 11/14/17 2157)  iopamidol (ISOVUE-370) 76 % injection 50 mL (50 mLs Intravenous Contrast Given 11/14/17 2103)    Initial Impression / Assessment and Plan / ED Course  I have reviewed the triage vital signs and the nursing notes.  Pertinent labs & imaging results that were available during my care of the patient were reviewed by me and considered in my medical decision making (see chart for  details).   8:58 PM CONSULT with Dr. Zipporah Plants with neurosurgery who states that pt should be placed in cervical collar and is appropriate for outpatient neurosurgery follow up in 1-2 weeks.   Discussed pt presentation and exam findings with Dr. Johnney Killian, who personally evaluated the patient and advised to discharge the patient with outpatient follow up.    Final Clinical Impressions(s) / ED Diagnoses   Final diagnoses:  Syncope and collapse  Closed nondisplaced fracture of fifth cervical vertebra, unspecified fracture morphology, initial encounter ([place])   Pt presenting after syncopal episode that occurred 2 days ago. States she  hit her head and that she is on coumadin.  CBC with mild anemia, though do not suspect this is contributing to her sxs. BMP at baseline. No electrolyte derangement. Pt-INR elevated appropriately. UA without UTI.    Ct head without intracranial abnormality.  CT maxillofacial without evidence of fracture or bony abnormality.  CT cervical spine with right L5 facet and lamina fracture without malalignment.  Fracture appeared to extend into posterior aspect of the vertebral artery foramen and CTA was recommended to rule out vertebral artery involvement.  CTA of the neck was negative for vertebral artery dissection. Her neurologic exam is benign. Cardiac and pulmonary exams benign. c-collar was placed given her cervical spine fracture.  Surgery consulted with recommendations as above.  Advised patient follow-up in 1 to 2 weeks for re-eval  Has had frequent episodes of getting dizzy upon standing since starting BP medications and suspect that her sxs could be related to this. She denies that her AICD shocked her. Her trop is not elevated today and her ECG is at baseline and unchanged for patient.  Patient advised to follow-up with her cardiologist in regards to today's visit.  Advised to return to the ER for any new or worsening symptoms in the meantime.   ED Discharge Orders     None       Bishop Dublin 11/14/17 2320    Charlesetta Shanks, MD 11/20/17 1447

## 2017-11-17 ENCOUNTER — Ambulatory Visit (HOSPITAL_COMMUNITY)
Admission: RE | Admit: 2017-11-17 | Discharge: 2017-11-17 | Disposition: A | Discharge status: Home / Self Care | Payer: Medicare Other | Source: Ambulatory Visit | Attending: Cardiology | Admitting: Cardiology

## 2017-11-17 VITALS — BP 94/61 | HR 88 | Wt 142.6 lb

## 2017-11-17 DIAGNOSIS — I5022 Chronic systolic (congestive) heart failure: Secondary | ICD-10-CM

## 2017-11-17 DIAGNOSIS — K219 Gastro-esophageal reflux disease without esophagitis: Secondary | ICD-10-CM | POA: Insufficient documentation

## 2017-11-17 DIAGNOSIS — Z8249 Family history of ischemic heart disease and other diseases of the circulatory system: Secondary | ICD-10-CM | POA: Insufficient documentation

## 2017-11-17 DIAGNOSIS — Z87891 Personal history of nicotine dependence: Secondary | ICD-10-CM | POA: Insufficient documentation

## 2017-11-17 DIAGNOSIS — Z79899 Other long term (current) drug therapy: Secondary | ICD-10-CM | POA: Diagnosis not present

## 2017-11-17 DIAGNOSIS — R7989 Other specified abnormal findings of blood chemistry: Secondary | ICD-10-CM | POA: Diagnosis not present

## 2017-11-17 DIAGNOSIS — I110 Hypertensive heart disease with heart failure: Secondary | ICD-10-CM | POA: Insufficient documentation

## 2017-11-17 DIAGNOSIS — I428 Other cardiomyopathies: Secondary | ICD-10-CM | POA: Diagnosis not present

## 2017-11-17 DIAGNOSIS — Z7901 Long term (current) use of anticoagulants: Secondary | ICD-10-CM | POA: Diagnosis not present

## 2017-11-17 DIAGNOSIS — Z85038 Personal history of other malignant neoplasm of large intestine: Secondary | ICD-10-CM | POA: Diagnosis not present

## 2017-11-17 DIAGNOSIS — Z9221 Personal history of antineoplastic chemotherapy: Secondary | ICD-10-CM | POA: Insufficient documentation

## 2017-11-17 DIAGNOSIS — Z95810 Presence of automatic (implantable) cardiac defibrillator: Secondary | ICD-10-CM | POA: Insufficient documentation

## 2017-11-17 DIAGNOSIS — Z8543 Personal history of malignant neoplasm of ovary: Secondary | ICD-10-CM | POA: Diagnosis not present

## 2017-11-17 DIAGNOSIS — I480 Paroxysmal atrial fibrillation: Secondary | ICD-10-CM | POA: Insufficient documentation

## 2017-11-17 LAB — COMPREHENSIVE METABOLIC PANEL
ALT: 22 U/L (ref 0–44)
AST: 27 U/L (ref 15–41)
Albumin: 3.6 g/dL (ref 3.5–5.0)
Alkaline Phosphatase: 56 U/L (ref 38–126)
Anion gap: 9 (ref 5–15)
BUN: 20 mg/dL (ref 8–23)
CO2: 28 mmol/L (ref 22–32)
Calcium: 9.2 mg/dL (ref 8.9–10.3)
Chloride: 101 mmol/L (ref 98–111)
Creatinine, Ser: 1.22 mg/dL — ABNORMAL HIGH (ref 0.44–1.00)
GFR calc Af Amer: 48 mL/min — ABNORMAL LOW (ref >60)
GFR calc non Af Amer: 41 mL/min — ABNORMAL LOW (ref >60)
Glucose, Bld: 87 mg/dL (ref 70–99)
Potassium: 4.2 mmol/L (ref 3.5–5.1)
Sodium: 138 mmol/L (ref 135–145)
Total Bilirubin: 1 mg/dL (ref 0.3–1.2)
Total Protein: 6.6 g/dL (ref 6.5–8.1)

## 2017-11-17 LAB — TSH: TSH: 5.44 u[IU]/mL — ABNORMAL HIGH (ref 0.350–4.500)

## 2017-11-17 MED ORDER — METOPROLOL SUCCINATE ER 25 MG PO TB24: 25.0000 mg | ORAL_TABLET | Freq: Two times a day (BID) | ORAL | 11 refills | Status: DC

## 2017-11-17 MED ORDER — AMIODARONE HCL 200 MG PO TABS: 200.0000 mg | ORAL_TABLET | Freq: Every day | ORAL | 3 refills | Status: DC

## 2017-11-17 NOTE — Patient Instructions (Signed)
INCREASE Amiodarone to 200 mg (1 whole tablet) once daily.  DECREASE Toprol XL to 25 mg twice daily. Can half your current 50 mg tablets you have at home (Take 1/2 tablet twice daily). New Rx has been sent to your pharmacy for 25 mg tablets (Take 1 tablet twice daily).  Routine lab work today. Will notify you of abnormal results, otherwise no news is good news!  Follow up 2 months with Dr. Aundra Dubin.  ____________________________________________________________________ Cynthia Frank Code: [id number]  Take all medication as prescribed the day of your appointment. Bring all medications with you to your appointment.  Do the following things EVERYDAY: 1) Weigh yourself in the morning before breakfast. Write it down and keep it in a log. 2) Take your medicines as prescribed 3) Eat low salt foods-Limit salt (sodium) to 2000 mg per day.  4) Stay as active as you can everyday 5) Limit all fluids for the day to less than 2 liters

## 2017-11-18 NOTE — Progress Notes (Signed)
Patient ID: Cynthia Frank, female   DOB: 1935/06/30, 82 y.o.   MRN: [id number]    Advanced Heart Failure Clinic Note   PCP: Dr. Sharlet Salina HF Cardiology: Dr. Aundra Dubin  82 y.o. with history of chronic systolic CHF/nonischemic cardiomyopathy, paroxysmal atrial fibrillation, and prior ovarian and colon cancers presents for CHF followup. She has had a cardiomyopathy known for > 15 years now. Cardiac cath at diagnosis showed mild nonobstructive disease.  EF has been persistently low.  Cause has been thought to be Adriamycin; she received this as part of her ovarian cancer treatment. She has had paroxysmal atrial fibrillation and has remained in NSR with amiodarone use.  No recent atrial fibrillation symptoms, typically feels prolonged palpitations thought to be atrial fibrillation 2-3 times/year.   She had had dyspnea with moderate to heavy exertion for 4-5 years prior to initial appt.  However, just before her initial appt, her symptoms had worsened.  She developed shortness of breath walking short distances on flat ground.  Lasix was increased to 40 mg bid and she lost weight and felt better.     Cardiac MRI was done in 1/17, showing EF 29% with normal RV.  Septal-lateral dyssynchrony was present and there was a non-coronary pattern of LGE in the septum.  LFTs were noted to be mildly elevated.  Amiodarone was decreased to 100 mg daily.   She had Medtronic CRT-D placed in 3/17. Repeat echo 8/17 showed EF 35% with diffuse hypokinesis, normal RV.  Echo in 4/19 showed EF 30-35%, mild AI.   On 7/28, she stood up, got lightheaded and passed out briefly with a fall.  Her ICD did not discharge.  She went to the ER and was found to have right C5 transverse process fracture.  She is wearing a cervical collar.  She says that she has had lightheadedness with standing for years, comes and goes.  She does not feel like she has been more lightheaded than is her norm, but that one episode on 7/28 led to syncope.   Otherwise, she has been doing well with no chest pain, no significant exertional dyspnea, and no orthopnea/PND. Weight is up 2 lbs.  Occasional palpitations.    Medtronic device interrogation: Fluid index < threshold with stable impedance, she is BiV pacing 96% of the time but has been having frequent atrial fibrillation (9% of total beats).   Labs (1/17): K 4.2, creatinine 1.14, BNP 855 Labs (3/17): SPEP negative, BNP 341, K 4.5, creatinine 1.27, AST 62, ALT 74, TSH normal with low free T3 and normal free T4, HCT 46.5 Labs (4/17): AST 64, ALT 67, BNP 278, K 3.6, creatinine 1.21 Labs (5/17): K 3.4, creatinine 1.39, AST 53, ALT 61, HBV/HCV negative, TSH mildly elevated, BNP 306 Labs (6/17): K 4.4, creatinine 1.36, free T4 normal, free T3 mildly decreased, AST 50, ALT 53. Labs (7/17): ANA negative, ASMA positive Labs (8/17): AST 45, ALT 37, K 3.8, creatinine 1.19, TSH normal Labs (2/18): TSH normal, LDL 47, LFTs, normal Labs (3/18): K normal, creatinine 1.23.  Labs (7/18): K 4, creatinine 1.32, LFTs normal Labs (7/19): K 3.8, creatinine 1.25, hgb 11.6  PMH: 1. Chronic systolic CHF: Nonischemic cardiomyopathy, thought to be related to adriamycin that she had for treatment of ovarian cancer.  NICM diagnosed ~ 2001.  - LHC ~ 2001 with 30% stenosis D1.   - Echo (2013) EF 25-30%.  - Echo (10/14) with EF 25-30%. - Echo (12/16) with EF 20-25%, mild LV dilation, restrictive diastolic function, moderate  MR, severe LAE. - Cardiac MRI (1/17): Moderately dilated LV with EF 29%. Diffuse hypokinesis looking worse in the septal wall. Septal-lateral dyssynchrony. Normal RV size and systolic function.  Moderate MR with tethered posterior leaflet.  Mid-wall LGE in the basal to mid septum. This is not a coronary disease pattern. Could be suggestive of prior myocarditis. - Medtronic CRT-D placed 3/17.  - Echo (8/17): EF 35%, diffuse hypokinesis, normal RV size and systolic function, moderate MR.  - Echo (4/19):  EF 30-35%, mild AI.  2. Atrial fibrillation: Paroxysmal.  3. HTN 4. Ovarian cancer: 1985, s/p surgery. Received Adriamycin-based chemotherapy.  5. Colon cancer: 2007, s/p surgery.  6. PVCs 7. GERD 8. PFTs (8/16) without significant abnormality. 9. Prior syncope 10. Elevated transaminases: Uncertain etiology.   SH: Married, lives in [place], prior smoker.   FH: Father with MI.   ROS: All systems reviewed and negative except as per HPI.   Current Outpatient Medications  Medication Sig Dispense Refill  . amiodarone (PACERONE) 200 MG tablet Take 1 tablet (200 mg total) by mouth daily. 90 tablet 3  . atorvastatin (LIPITOR) 40 MG tablet Take 1 tablet (40 mg total) by mouth daily. 90 tablet 0  . citalopram (CELEXA) 20 MG tablet Take 1 tablet (20 mg total) by mouth daily. 90 tablet 3  . Coenzyme Q10 (COQ10) 100 MG CAPS Take 100 mg by mouth daily.     Marland Kitchen conjugated estrogens (PREMARIN) vaginal cream Place 1 Applicatorful vaginally as needed (dyspareunia (Use as directed)). Reported on 05/11/2015    . diclofenac sodium (VOLTAREN) 1 % GEL APPLY 2 GRAMS TOPICALLY 4 TIMES DAILY 100 g 0  . furosemide (LASIX) 40 MG tablet TAKE 1 TABLET BY MOUTH IN THE MORNING AND 1/2 TABLET IN THE EVENING 45 tablet 3  . ipratropium (ATROVENT) 0.03 % nasal spray Place 2 sprays into both nostrils 2 (two) times daily.  11  . Magnesium Oxide 500 MG CAPS Take 2,000 mg by mouth daily.    . metoprolol succinate (TOPROL-XL) 25 MG 24 hr tablet Take 1 tablet (25 mg total) by mouth 2 (two) times daily. 60 tablet 11  . potassium chloride SA (K-DUR,KLOR-CON) 20 MEQ tablet TAKE 2 TABLETS BY MOUTH DAILY 60 tablet 3  . sacubitril-valsartan (ENTRESTO) 24-26 MG Take 1 tablet by mouth 2 (two) times daily. 180 tablet 3  . spironolactone (ALDACTONE) 25 MG tablet Take 1 tablet (25 mg total) by mouth every evening. 90 tablet 1  . traMADol (ULTRAM) 50 MG tablet Take 50 mg by mouth every 6 (six) hours as needed for moderate pain. Reported  on 10/16/2015    . tretinoin (RETIN-A) 0.1 % cream Apply 1 application topically every morning.     . warfarin (COUMADIN) 2.5 MG tablet TAKE AS DIRECTED BY COUMADIN CLINIC 90 tablet 1  . zolpidem (AMBIEN) 5 MG tablet TAKE 1 TABLET BY MOUTH EVERY DAY AT BEDTIME AS NEEDED 30 tablet 5   No current facility-administered medications for this encounter.    BP 94/61   Pulse 88   Wt 142 lb 9.6 oz (64.7 kg)   SpO2 97%   BMI 23.73 kg/m    Wt Readings from Last 3 Encounters:  11/17/17 142 lb 9.6 oz (64.7 kg)  08/09/17 142 lb (64.4 kg)  07/21/17 142 lb (64.4 kg)   General: NAD Neck: No JVD, no thyromegaly or thyroid nodule.  Lungs: Clear to auscultation bilaterally with normal respiratory effort. CV: Nondisplaced PMI.  Heart regular S1/S2, no S3/S4, no murmur.  No peripheral edema.  No carotid bruit.  Normal pedal pulses.  Abdomen: Soft, nontender, no hepatosplenomegaly, no distention.  Skin: No rash.   Neurologic: Alert and oriented x 3.  Psych: Normal affect. Extremities: No clubbing or cyanosis.  HEENT: Left facial ecchymosis.   Assessment/Plan: 1. Chronic systolic CHF: EF 66-44% with diffuse hypokinesis on 12/16 echo.  Nonischemic cardiomyopathy, probably related to Adriamycin with ovarian cancer treatment.  Cardiac MRI in 1/17 showed EF 29% with normal RV and septal-lateral dyssynchrony.  There was a non-cardiac pattern of LGE in the septum, cannot rule out prior myocarditis based on this pattern.  She has a Medtronic CRT-D device.  Echo 4/19 with EF 30-35%.  NYHA class II symptoms currently.  She looks euvolemic on exam and by Optivol. She had an episode of orthostatic hypotension with brief syncope recently.  BP 94/60 today.    - Decrease Toprol XL to 25 mg bid to allow higher BP (we had increased this at the last appt).  - Continue spironolactone 25 daily. BMET today.  - Continue Entresto 24/26 bid.     - Continue Lasix 40 qam/20 qpm and KCl 40 daily.  - BMET today.  2. Atrial  fibrillation: Paroxysmal.  Increased afib burden, 9% on device interrogation today.  - Increase amiodarone to 200 mg daily. Check LFTs and TSH today.  She will need periodic eye exam as well.  - Continue warfarin. - With increasing burden of atrial fibrillation and cardiomyopathy, would consider her for atrial fibrillation ablation.  Will discuss with Dr. Lovena Le.   3. Elevated LFTs: This may be due to CHF versus amiodarone.  Amiodarone decreased to 100 mg daily.  Abdominal US did not show liver abnormalities or gallstones, CT abdomen showed normal liver in 7/17.  HCV/HBV serologies negative, ANA negative, ASMA positive but not thought to be significant.  LFTs back to normal in 3/18.   - Check LFTs again today.  As above, I am increasing amiodarone to 200 mg daily.    Followup in 2 months  [name] 11/18/2017

## 2017-11-27 ENCOUNTER — Ambulatory Visit (INDEPENDENT_AMBULATORY_CARE_PROVIDER_SITE_OTHER): Payer: Medicare Other | Admitting: *Deleted

## 2017-11-27 DIAGNOSIS — I480 Paroxysmal atrial fibrillation: Secondary | ICD-10-CM

## 2017-11-27 LAB — POCT INR: INR: 4 — AB (ref 2.0–3.0)

## 2017-11-27 NOTE — Patient Instructions (Signed)
Description   Skip today's dose, then Continue taking 1 tablet daily except  1/2 tablet on Tuesdays, Thursdays and Sundays.   Recheck INR in 2 weeks.  Call us with any new medications # [phone].

## 2017-12-07 ENCOUNTER — Other Ambulatory Visit: Payer: Self-pay

## 2017-12-11 ENCOUNTER — Telehealth: Payer: Self-pay | Admitting: Gastroenterology

## 2017-12-11 ENCOUNTER — Ambulatory Visit (INDEPENDENT_AMBULATORY_CARE_PROVIDER_SITE_OTHER): Payer: Medicare Other | Admitting: *Deleted

## 2017-12-11 DIAGNOSIS — Z5181 Encounter for therapeutic drug level monitoring: Secondary | ICD-10-CM | POA: Diagnosis not present

## 2017-12-11 DIAGNOSIS — I480 Paroxysmal atrial fibrillation: Secondary | ICD-10-CM

## 2017-12-11 DIAGNOSIS — S12491A Other nondisplaced fracture of fifth cervical vertebra, initial encounter for closed fracture: Secondary | ICD-10-CM | POA: Diagnosis not present

## 2017-12-11 LAB — POCT INR: INR: 2.9 (ref 2.0–3.0)

## 2017-12-11 NOTE — Telephone Encounter (Signed)
Patient is scheduled for an office visit on 12/14/17 to discuss scheduling recall Colonoscopy. Will schedule and discuss procedure date options the day of the office visit.

## 2017-12-11 NOTE — Patient Instructions (Signed)
Description   Continue taking 1 tablet daily except  1/2 tablet on Tuesdays, Thursdays and Sundays.   Recheck INR in 3 weeks.  Call us with any new medications # [phone].

## 2017-12-14 ENCOUNTER — Telehealth: Payer: Self-pay

## 2017-12-14 ENCOUNTER — Encounter: Payer: Self-pay | Admitting: Gastroenterology

## 2017-12-14 ENCOUNTER — Ambulatory Visit (INDEPENDENT_AMBULATORY_CARE_PROVIDER_SITE_OTHER): Payer: Medicare Other | Admitting: Gastroenterology

## 2017-12-14 VITALS — BP 80/54 | HR 61 | Ht 65.0 in | Wt 140.0 lb

## 2017-12-14 DIAGNOSIS — Z86010 Personal history of colonic polyps: Secondary | ICD-10-CM

## 2017-12-14 DIAGNOSIS — Z85038 Personal history of other malignant neoplasm of large intestine: Secondary | ICD-10-CM | POA: Diagnosis not present

## 2017-12-14 DIAGNOSIS — Z7901 Long term (current) use of anticoagulants: Secondary | ICD-10-CM | POA: Diagnosis not present

## 2017-12-14 NOTE — Telephone Encounter (Signed)
[facility] Pre-operative Risk Assessment     Request for surgical clearance:     Endoscopy Procedure  What type of surgery is being performed?     Colonoscopy  When is this surgery scheduled?     02/05/18  What type of clearance is required ?   Pharmacy  Are there any medications that need to be held prior to surgery and how long? Coumadin x 5 days  Practice name and name of physician performing surgery?      [facility]  What is your office phone and fax number?      Phone- [phone]  [phone]  Anesthesia type (None, local, MAC, general) ?       MAC

## 2017-12-14 NOTE — Patient Instructions (Signed)
You have been scheduled for a colonoscopy. Please follow written instructions given to you at your visit today.  Please pick up your prep supplies at the pharmacy within the next 1-3 days. If you use inhalers (even only as needed), please bring them with you on the day of your procedure. Your physician has requested that you go to www.startemmi.com and enter the access code given to you at your visit today. This web site gives a general overview about your procedure. However, you should still follow specific instructions given to you by our office regarding your preparation for the procedure.  Thank you for choosing me and [facility].  Malcolm T. Stark, Jr., MD., FACG  

## 2017-12-14 NOTE — Progress Notes (Signed)
    History of Present Illness: This is a 82 year old female with a history of colon cancer in 2007 status post right hemicolectomy.  She has had recurrent adenomatous colon polyps.  Most recent colonoscopy had four adenomatous polyps.  She has CHF with an EF of 30 to 35%.  She is maintained on Coumadin for paroxysmal atrial fibrillation.  She has no gastrointestinal complaints. Denies weight loss, abdominal pain, constipation, diarrhea, change in stool caliber, melena, hematochezia, nausea, vomiting, dysphagia, reflux symptoms, chest pain.  Colonoscopy 04/2015: 1. Six sessile polyps in the sigmoid colon and ascending colon; polypectomies performed with a cold snare 2. Prior surgical anastomosis in the ascending colon 3. Grade l internal hemorrhoids  Current Medications, Allergies, Past Medical History, Past Surgical History, Family History and Social History were reviewed in [employer] record.  Physical Exam: General: Well developed, well nourished, no acute distress Head: Normocephalic and atraumatic Eyes:  sclerae anicteric, EOMI Ears: Normal auditory acuity Mouth: No deformity or lesions Lungs: Clear throughout to auscultation Heart: Regular rate and rhythm; no murmurs, rubs or bruits Abdomen: Soft, non tender and non distended. No masses, hepatosplenomegaly or hernias noted. Normal Bowel sounds Rectal: Deferred to colonoscopy Musculoskeletal: Symmetrical with no gross deformities  Pulses:  Normal pulses noted Extremities: No clubbing, cyanosis, edema or deformities noted Neurological: Alert oriented x 4, grossly nonfocal Psychological:  Alert and cooperative. Normal mood and affect  Assessment and Recommendations:  1.  Personal history of colon cancer in 2007.  Status post right hemicolectomy.  Recurrent adenomatous colon polyps.  She is due for surveillance colonoscopy.  Schedule colonoscopy. The risks (including bleeding, perforation, infection, missed  lesions, medication reactions and possible hospitalization or surgery if complications occur), benefits, and alternatives to colonoscopy with possible biopsy and possible polypectomy were discussed with the patient and they consent to proceed.   2. CHF, EF 30 - 35 %  3.  Paroxysmal atrial fibrillation maintained on Coumadin. Hold Coumadin 5 days before procedure - will instruct when and how to resume after procedure. Low but real risk of cardiovascular event such as heart attack, stroke, embolism, thrombosis or ischemia/infarct of other organs off Coumadin explained and need to seek urgent help if this occurs. The patient consents to proceed. Will communicate by phone or EMR with patient's prescribing provider to confirm that holding Coumadin is reasonable in this case.

## 2017-12-15 NOTE — Telephone Encounter (Signed)
Patient notified Dr. Claris Gladden recommendations and to hold coumadin prior to her procedure x 5 days. Patient verbalized understanding.

## 2017-12-15 NOTE — Telephone Encounter (Signed)
OK to hold warfarin without Lovenox bridging for colonoscopy.

## 2017-12-15 NOTE — Telephone Encounter (Signed)
Pt takes warfarin for afib with CHADS2VASc score of 6 (age x2, sex, HTN, CAD, CHF). Per note from HF clinic earlier this month, pt has been in NSR with amiodarone and no recent afib symptoms. Pt has elevated cardiac risk however not high enough warrant bridging with Lovenox per our protocol. Recommend holding warfarin for 4 days prior to colonoscopy and resuming 10/21 in the evening after procedure.

## 2017-12-15 NOTE — Telephone Encounter (Signed)
   Primary Cardiologist:  [name] ([facility])  Patient is managed in the [facility] for chronic systolic congestive heart failure.  Per Pre-Op Protocol, will route to Dr. Aundra Dubin for recommendations regarding surgical risk.  Richardson Dopp, PA-C  12/15/2017, 11:43 AM

## 2017-12-22 DIAGNOSIS — J385 Laryngeal spasm: Secondary | ICD-10-CM | POA: Diagnosis not present

## 2017-12-22 DIAGNOSIS — Z87891 Personal history of nicotine dependence: Secondary | ICD-10-CM | POA: Diagnosis not present

## 2017-12-28 DIAGNOSIS — Z23 Encounter for immunization: Secondary | ICD-10-CM | POA: Diagnosis not present

## 2018-01-01 ENCOUNTER — Ambulatory Visit (INDEPENDENT_AMBULATORY_CARE_PROVIDER_SITE_OTHER): Payer: Medicare Other

## 2018-01-01 DIAGNOSIS — I480 Paroxysmal atrial fibrillation: Secondary | ICD-10-CM

## 2018-01-01 LAB — POCT INR: INR: 2.8 (ref 2.0–3.0)

## 2018-01-01 NOTE — Patient Instructions (Signed)
Description   Continue taking 1 tablet daily except 1/2 tablet on Tuesdays, Thursdays and Sundays. Recheck INR in 4 weeks.  Call us with any new medications # [phone].      

## 2018-01-07 ENCOUNTER — Other Ambulatory Visit: Payer: Self-pay | Admitting: Internal Medicine

## 2018-01-08 ENCOUNTER — Other Ambulatory Visit: Payer: Self-pay | Admitting: Internal Medicine

## 2018-01-08 NOTE — Telephone Encounter (Signed)
Control database checked last refill:11/20/2017 LOV: 08/09/2017 XEN:MMHW

## 2018-01-09 DIAGNOSIS — S12491A Other nondisplaced fracture of fifth cervical vertebra, initial encounter for closed fracture: Secondary | ICD-10-CM | POA: Diagnosis not present

## 2018-01-18 ENCOUNTER — Other Ambulatory Visit: Payer: Self-pay | Admitting: Cardiology

## 2018-01-29 ENCOUNTER — Ambulatory Visit (INDEPENDENT_AMBULATORY_CARE_PROVIDER_SITE_OTHER): Payer: Medicare Other | Admitting: *Deleted

## 2018-01-29 ENCOUNTER — Ambulatory Visit (INDEPENDENT_AMBULATORY_CARE_PROVIDER_SITE_OTHER): Payer: Medicare Other

## 2018-01-29 DIAGNOSIS — Z5181 Encounter for therapeutic drug level monitoring: Secondary | ICD-10-CM

## 2018-01-29 DIAGNOSIS — I428 Other cardiomyopathies: Secondary | ICD-10-CM | POA: Diagnosis not present

## 2018-01-29 DIAGNOSIS — I480 Paroxysmal atrial fibrillation: Secondary | ICD-10-CM | POA: Diagnosis not present

## 2018-01-29 DIAGNOSIS — I5022 Chronic systolic (congestive) heart failure: Secondary | ICD-10-CM

## 2018-01-29 LAB — POCT INR: INR: 2.3 (ref 2.0–3.0)

## 2018-01-29 NOTE — Patient Instructions (Signed)
Description   Continue taking 1 tablet daily except 1/2 tablet on Tuesdays, Thursdays and Sundays.   Take your last dosage of Coumadin on 01/31/18, prior to colonoscopy on 02/05/18.  Resume Coumadin at your norma l dosage regimen after your procedure on 02/05/18 if GI doctor states ok to do so.   Recheck INR in 1 week after procedure.  Call us with any new medications # [phone].

## 2018-01-30 ENCOUNTER — Ambulatory Visit (HOSPITAL_COMMUNITY)
Admission: RE | Admit: 2018-01-30 | Discharge: 2018-01-30 | Disposition: A | Discharge status: Home / Self Care | Payer: Medicare Other | Source: Ambulatory Visit | Attending: Cardiology | Admitting: Cardiology

## 2018-01-30 ENCOUNTER — Other Ambulatory Visit: Payer: Self-pay

## 2018-01-30 VITALS — BP 108/73 | HR 87 | Wt 142.8 lb

## 2018-01-30 DIAGNOSIS — Z85038 Personal history of other malignant neoplasm of large intestine: Secondary | ICD-10-CM | POA: Diagnosis not present

## 2018-01-30 DIAGNOSIS — I5022 Chronic systolic (congestive) heart failure: Secondary | ICD-10-CM | POA: Insufficient documentation

## 2018-01-30 DIAGNOSIS — R740 Nonspecific elevation of levels of transaminase and lactic acid dehydrogenase [LDH]: Secondary | ICD-10-CM | POA: Insufficient documentation

## 2018-01-30 DIAGNOSIS — K219 Gastro-esophageal reflux disease without esophagitis: Secondary | ICD-10-CM | POA: Insufficient documentation

## 2018-01-30 DIAGNOSIS — Z791 Long term (current) use of non-steroidal anti-inflammatories (NSAID): Secondary | ICD-10-CM | POA: Insufficient documentation

## 2018-01-30 DIAGNOSIS — I110 Hypertensive heart disease with heart failure: Secondary | ICD-10-CM | POA: Diagnosis not present

## 2018-01-30 DIAGNOSIS — I428 Other cardiomyopathies: Secondary | ICD-10-CM | POA: Insufficient documentation

## 2018-01-30 DIAGNOSIS — I480 Paroxysmal atrial fibrillation: Secondary | ICD-10-CM | POA: Insufficient documentation

## 2018-01-30 DIAGNOSIS — I493 Ventricular premature depolarization: Secondary | ICD-10-CM | POA: Diagnosis not present

## 2018-01-30 DIAGNOSIS — Z79899 Other long term (current) drug therapy: Secondary | ICD-10-CM | POA: Diagnosis not present

## 2018-01-30 DIAGNOSIS — Z7901 Long term (current) use of anticoagulants: Secondary | ICD-10-CM | POA: Insufficient documentation

## 2018-01-30 DIAGNOSIS — Z4509 Encounter for adjustment and management of other cardiac device: Secondary | ICD-10-CM | POA: Insufficient documentation

## 2018-01-30 DIAGNOSIS — Z8543 Personal history of malignant neoplasm of ovary: Secondary | ICD-10-CM | POA: Diagnosis not present

## 2018-01-30 DIAGNOSIS — Z87891 Personal history of nicotine dependence: Secondary | ICD-10-CM | POA: Insufficient documentation

## 2018-01-30 LAB — COMPREHENSIVE METABOLIC PANEL
ALT: 28 U/L (ref 0–44)
AST: 34 U/L (ref 15–41)
Albumin: 3.9 g/dL (ref 3.5–5.0)
Alkaline Phosphatase: 65 U/L (ref 38–126)
Anion gap: 8 (ref 5–15)
BUN: 16 mg/dL (ref 8–23)
CO2: 24 mmol/L (ref 22–32)
Calcium: 8.9 mg/dL (ref 8.9–10.3)
Chloride: 105 mmol/L (ref 98–111)
Creatinine, Ser: 1.23 mg/dL — ABNORMAL HIGH (ref 0.44–1.00)
GFR calc Af Amer: 47 mL/min — ABNORMAL LOW (ref >60)
GFR calc non Af Amer: 40 mL/min — ABNORMAL LOW (ref >60)
Glucose, Bld: 100 mg/dL — ABNORMAL HIGH (ref 70–99)
Potassium: 4.1 mmol/L (ref 3.5–5.1)
Sodium: 137 mmol/L (ref 135–145)
Total Bilirubin: 0.8 mg/dL (ref 0.3–1.2)
Total Protein: 7.2 g/dL (ref 6.5–8.1)

## 2018-01-30 LAB — T4, FREE: Free T4: 0.86 ng/dL (ref 0.82–1.77)

## 2018-01-30 LAB — TSH: TSH: 6.261 u[IU]/mL — ABNORMAL HIGH (ref 0.350–4.500)

## 2018-01-30 NOTE — Patient Instructions (Signed)
Labs today  Your physician recommends that you schedule a follow-up appointment in: 3 months  

## 2018-01-30 NOTE — H&P (View-Only) (Signed)
Patient ID: Cynthia Frank, female   DOB: 06-13-1935, 82 y.o.   MRN: [id number]    Advanced Heart Failure Clinic Note   PCP: Dr. Sharlet Salina HF Cardiology: Dr. Aundra Dubin  82 y.o. with history of chronic systolic CHF/nonischemic cardiomyopathy, paroxysmal atrial fibrillation, and prior ovarian and colon cancers presents for CHF followup. She has had a cardiomyopathy known for > 15 years now. Cardiac cath at diagnosis showed mild nonobstructive disease.  EF has been persistently low.  Cause has been thought to be Adriamycin; she received this as part of her ovarian cancer treatment. She has had paroxysmal atrial fibrillation and has remained in NSR with amiodarone use.  No recent atrial fibrillation symptoms, typically feels prolonged palpitations thought to be atrial fibrillation 2-3 times/year.   She had had dyspnea with moderate to heavy exertion for 4-5 years prior to initial appt.  However, just before her initial appt, her symptoms had worsened.  She developed shortness of breath walking short distances on flat ground.  Lasix was increased to 40 mg bid and she lost weight and felt better.     Cardiac MRI was done in 1/17, showing EF 29% with normal RV.  Septal-lateral dyssynchrony was present and there was a non-coronary pattern of LGE in the septum.  LFTs were noted to be mildly elevated.  Amiodarone was decreased to 100 mg daily.   She had Medtronic CRT-D placed in 3/17. Repeat echo 8/17 showed EF 35% with diffuse hypokinesis, normal RV.  Echo in 4/19 showed EF 30-35%, mild AI.   On 11/12/17, she stood up, got lightheaded and passed out briefly with a fall.  Her ICD did not discharge.  She went to the ER and was found to have right C5 transverse process fracture.  She is wearing a cervical collar.  She says that she has had lightheadedness with standing for years, comes and goes.  She does not feel like she has been more lightheaded than is her norm, but that one episode on 7/28 led to syncope.  I  decreased her Toprol XL.   Today, she says that she is doing well.  No episodes of lightheadedness.  Went on a 2 week cruise with no problems.  Weight is stable.  No dyspnea walking on flat ground.  She is short of breath walking up hills.  No chest pain.  No orthopnea/PND.    Medtronic device interrogation: Fluid index < threshold with stable impedance, 99% BiV pacing, less atrial fibrillation episodes than last interrogation (rare).    Labs (1/17): K 4.2, creatinine 1.14, BNP 855 Labs (3/17): SPEP negative, BNP 341, K 4.5, creatinine 1.27, AST 62, ALT 74, TSH normal with low free T3 and normal free T4, HCT 46.5 Labs (4/17): AST 64, ALT 67, BNP 278, K 3.6, creatinine 1.21 Labs (5/17): K 3.4, creatinine 1.39, AST 53, ALT 61, HBV/HCV negative, TSH mildly elevated, BNP 306 Labs (6/17): K 4.4, creatinine 1.36, free T4 normal, free T3 mildly decreased, AST 50, ALT 53. Labs (7/17): ANA negative, ASMA positive Labs (8/17): AST 45, ALT 37, K 3.8, creatinine 1.19, TSH normal Labs (2/18): TSH normal, LDL 47, LFTs, normal Labs (3/18): K normal, creatinine 1.23.  Labs (7/18): K 4, creatinine 1.32, LFTs normal Labs (7/19): K 3.8, creatinine 1.25, hgb 11.6 Labs (8/19): TSH mildly elevated, K 4.2, creatinine 1.22  PMH: 1. Chronic systolic CHF: Nonischemic cardiomyopathy, thought to be related to adriamycin that she had for treatment of ovarian cancer.  NICM diagnosed ~ 2001.  -  LHC ~ 2001 with 30% stenosis D1.   - Echo (2013) EF 25-30%.  - Echo (10/14) with EF 25-30%. - Echo (12/16) with EF 20-25%, mild LV dilation, restrictive diastolic function, moderate MR, severe LAE. - Cardiac MRI (1/17): Moderately dilated LV with EF 29%. Diffuse hypokinesis looking worse in the septal wall. Septal-lateral dyssynchrony. Normal RV size and systolic function.  Moderate MR with tethered posterior leaflet.  Mid-wall LGE in the basal to mid septum. This is not a coronary disease pattern. Could be suggestive of prior  myocarditis. - Medtronic CRT-D placed 3/17.  - Echo (8/17): EF 35%, diffuse hypokinesis, normal RV size and systolic function, moderate MR.  - Echo (4/19): EF 30-35%, mild AI.  2. Atrial fibrillation: Paroxysmal.  3. HTN 4. Ovarian cancer: 1985, s/p surgery. Received Adriamycin-based chemotherapy.  5. Colon cancer: 2007, s/p surgery.  6. PVCs 7. GERD 8. PFTs (8/16) without significant abnormality. 9. Prior syncope 10. Elevated transaminases: Uncertain etiology.   SH: Married, lives in [place], prior smoker.   FH: Father with MI.   ROS: All systems reviewed and negative except as per HPI.   Current Outpatient Medications  Medication Sig Dispense Refill  . amiodarone (PACERONE) 200 MG tablet Take 1 tablet (200 mg total) by mouth daily. 90 tablet 3  . atorvastatin (LIPITOR) 40 MG tablet TAKE ONE TABLET BY MOUTH ONE TIME DAILY  90 tablet 0  . citalopram (CELEXA) 20 MG tablet Take 1 tablet (20 mg total) by mouth daily. 90 tablet 3  . Coenzyme Q10 (COQ10) 100 MG CAPS Take 100 mg by mouth daily.     Marland Kitchen conjugated estrogens (PREMARIN) vaginal cream Place 1 Applicatorful vaginally as needed (dyspareunia (Use as directed)). Reported on 05/11/2015    . diclofenac sodium (VOLTAREN) 1 % GEL APPLY 2 GRAMS TOPICALLY 4 TIMES DAILY 100 g 0  . furosemide (LASIX) 40 MG tablet TAKE 1 TABLET BY MOUTH IN THE MORNING AND 1/2 TABLET IN THE EVENING 45 tablet 3  . ibuprofen (ADVIL,MOTRIN) 200 MG tablet Take 200 mg by mouth daily as needed for headache or moderate pain.    Marland Kitchen ipratropium (ATROVENT) 0.03 % nasal spray Place 2 sprays into both nostrils 2 (two) times daily.  11  . Magnesium Oxide 500 MG CAPS Take 2,000 mg by mouth daily.    . metoprolol succinate (TOPROL-XL) 25 MG 24 hr tablet Take 1 tablet (25 mg total) by mouth 2 (two) times daily. 60 tablet 11  . potassium chloride SA (K-DUR,KLOR-CON) 20 MEQ tablet TAKE 2 TABLETS BY MOUTH DAILY 60 tablet 3  . sacubitril-valsartan (ENTRESTO) 24-26 MG Take 1  tablet by mouth 2 (two) times daily. 180 tablet 3  . spironolactone (ALDACTONE) 25 MG tablet Take 1 tablet (25 mg total) by mouth every evening. 90 tablet 1  . tretinoin (RETIN-A) 0.1 % cream Apply 1 application topically every morning.     . warfarin (COUMADIN) 2.5 MG tablet TAKE AS DIRECTED BY COUMADIN CLINIC 90 tablet 1  . zolpidem (AMBIEN) 5 MG tablet TAKE 1 TABLET BY MOUTH EVERY DAY AT BEDTIME AS NEEDED 30 tablet 0   No current facility-administered medications for this encounter.    BP 108/73   Pulse 87   Wt 64.8 kg (142 lb 12.8 oz)   SpO2 98%   BMI 23.76 kg/m    Wt Readings from Last 3 Encounters:  01/30/18 64.8 kg (142 lb 12.8 oz)  12/14/17 63.5 kg (140 lb)  11/17/17 64.7 kg (142 lb 9.6 oz)  General: NAD Neck: No JVD, no thyromegaly or thyroid nodule.  Lungs: Clear to auscultation bilaterally with normal respiratory effort. CV: Nondisplaced PMI.  Heart regular S1/S2, no S3/S4, no murmur.  No peripheral edema.  No carotid bruit.  Normal pedal pulses.  Abdomen: Soft, nontender, no hepatosplenomegaly, no distention.  Skin: Intact without lesions or rashes.  Neurologic: Alert and oriented x 3.  Psych: Normal affect. Extremities: No clubbing or cyanosis.  HEENT: Normal.   Assessment/Plan: 1. Chronic systolic CHF: EF 25-85% with diffuse hypokinesis on 12/16 echo.  Nonischemic cardiomyopathy, probably related to Adriamycin with ovarian cancer treatment.  Cardiac MRI in 1/17 showed EF 29% with normal RV and septal-lateral dyssynchrony.  There was a non-cardiac pattern of LGE in the septum, cannot rule out prior myocarditis based on this pattern.  She has a Medtronic CRT-D device.  Echo 4/19 with EF 30-35%.  NYHA class II symptoms currently.  She looks euvolemic on exam and by Optivol. BP better, no longer having orthostatic-type symptoms.     - Continue Toprol XL 25 mg bid.  - Continue spironolactone 25 daily. BMET today.  - Continue Entresto 24/26 bid.     - Continue Lasix 40  qam/20 qpm and KCl 40 daily.  - I will not titrate her BP-active meds given orthostasis with fall this summer.  2. Atrial fibrillation: Paroxysmal. Lower atrial fibrillation burden on device on 200 mg daily amiodarone.   - Check LFTs today with amiodarone use.  Will also need TSH, free T3, free T4 (mildly elevated TSH at last check).  She will need periodic eye exam as well.  - Continue warfarin. 3. Elevated LFTs: This may be due to CHF versus amiodarone.  Amiodarone decreased to 100 mg daily.  Abdominal US did not show liver abnormalities or gallstones, CT abdomen showed normal liver in 7/17.  HCV/HBV serologies negative, ANA negative, ASMA positive but not thought to be significant.  LFTs back to normal in 3/18.  Amiodarone increased back to 200 mg daily recently.  - Repeat LFTs today to make sure they do not rise again.    Followup in 3 months  [name] 01/30/2018

## 2018-01-30 NOTE — Progress Notes (Signed)
Remote ICD transmission.   

## 2018-01-30 NOTE — Progress Notes (Signed)
Patient ID: Cynthia Frank, female   DOB: 11/18/1935, 82 y.o.   MRN: [id number]    Advanced Heart Failure Clinic Note   PCP: Dr. Sharlet Salina HF Cardiology: Dr. Aundra Dubin  82 y.o. with history of chronic systolic CHF/nonischemic cardiomyopathy, paroxysmal atrial fibrillation, and prior ovarian and colon cancers presents for CHF followup. She has had a cardiomyopathy known for > 15 years now. Cardiac cath at diagnosis showed mild nonobstructive disease.  EF has been persistently low.  Cause has been thought to be Adriamycin; she received this as part of her ovarian cancer treatment. She has had paroxysmal atrial fibrillation and has remained in NSR with amiodarone use.  No recent atrial fibrillation symptoms, typically feels prolonged palpitations thought to be atrial fibrillation 2-3 times/year.   She had had dyspnea with moderate to heavy exertion for 4-5 years prior to initial appt.  However, just before her initial appt, her symptoms had worsened.  She developed shortness of breath walking short distances on flat ground.  Lasix was increased to 40 mg bid and she lost weight and felt better.     Cardiac MRI was done in 1/17, showing EF 29% with normal RV.  Septal-lateral dyssynchrony was present and there was a non-coronary pattern of LGE in the septum.  LFTs were noted to be mildly elevated.  Amiodarone was decreased to 100 mg daily.   She had Medtronic CRT-D placed in 3/17. Repeat echo 8/17 showed EF 35% with diffuse hypokinesis, normal RV.  Echo in 4/19 showed EF 30-35%, mild AI.   On 11/12/17, she stood up, got lightheaded and passed out briefly with a fall.  Her ICD did not discharge.  She went to the ER and was found to have right C5 transverse process fracture.  She is wearing a cervical collar.  She says that she has had lightheadedness with standing for years, comes and goes.  She does not feel like she has been more lightheaded than is her norm, but that one episode on 7/28 led to syncope.  I  decreased her Toprol XL.   Today, she says that she is doing well.  No episodes of lightheadedness.  Went on a 2 week cruise with no problems.  Weight is stable.  No dyspnea walking on flat ground.  She is short of breath walking up hills.  No chest pain.  No orthopnea/PND.    Medtronic device interrogation: Fluid index < threshold with stable impedance, 99% BiV pacing, less atrial fibrillation episodes than last interrogation (rare).    Labs (1/17): K 4.2, creatinine 1.14, BNP 855 Labs (3/17): SPEP negative, BNP 341, K 4.5, creatinine 1.27, AST 62, ALT 74, TSH normal with low free T3 and normal free T4, HCT 46.5 Labs (4/17): AST 64, ALT 67, BNP 278, K 3.6, creatinine 1.21 Labs (5/17): K 3.4, creatinine 1.39, AST 53, ALT 61, HBV/HCV negative, TSH mildly elevated, BNP 306 Labs (6/17): K 4.4, creatinine 1.36, free T4 normal, free T3 mildly decreased, AST 50, ALT 53. Labs (7/17): ANA negative, ASMA positive Labs (8/17): AST 45, ALT 37, K 3.8, creatinine 1.19, TSH normal Labs (2/18): TSH normal, LDL 47, LFTs, normal Labs (3/18): K normal, creatinine 1.23.  Labs (7/18): K 4, creatinine 1.32, LFTs normal Labs (7/19): K 3.8, creatinine 1.25, hgb 11.6 Labs (8/19): TSH mildly elevated, K 4.2, creatinine 1.22  PMH: 1. Chronic systolic CHF: Nonischemic cardiomyopathy, thought to be related to adriamycin that she had for treatment of ovarian cancer.  NICM diagnosed ~ 2001.  -  LHC ~ 2001 with 30% stenosis D1.   - Echo (2013) EF 25-30%.  - Echo (10/14) with EF 25-30%. - Echo (12/16) with EF 20-25%, mild LV dilation, restrictive diastolic function, moderate MR, severe LAE. - Cardiac MRI (1/17): Moderately dilated LV with EF 29%. Diffuse hypokinesis looking worse in the septal wall. Septal-lateral dyssynchrony. Normal RV size and systolic function.  Moderate MR with tethered posterior leaflet.  Mid-wall LGE in the basal to mid septum. This is not a coronary disease pattern. Could be suggestive of prior  myocarditis. - Medtronic CRT-D placed 3/17.  - Echo (8/17): EF 35%, diffuse hypokinesis, normal RV size and systolic function, moderate MR.  - Echo (4/19): EF 30-35%, mild AI.  2. Atrial fibrillation: Paroxysmal.  3. HTN 4. Ovarian cancer: 1985, s/p surgery. Received Adriamycin-based chemotherapy.  5. Colon cancer: 2007, s/p surgery.  6. PVCs 7. GERD 8. PFTs (8/16) without significant abnormality. 9. Prior syncope 10. Elevated transaminases: Uncertain etiology.   SH: Married, lives in [place], prior smoker.   FH: Father with MI.   ROS: All systems reviewed and negative except as per HPI.   Current Outpatient Medications  Medication Sig Dispense Refill  . amiodarone (PACERONE) 200 MG tablet Take 1 tablet (200 mg total) by mouth daily. 90 tablet 3  . atorvastatin (LIPITOR) 40 MG tablet TAKE ONE TABLET BY MOUTH ONE TIME DAILY  90 tablet 0  . citalopram (CELEXA) 20 MG tablet Take 1 tablet (20 mg total) by mouth daily. 90 tablet 3  . Coenzyme Q10 (COQ10) 100 MG CAPS Take 100 mg by mouth daily.     Marland Kitchen conjugated estrogens (PREMARIN) vaginal cream Place 1 Applicatorful vaginally as needed (dyspareunia (Use as directed)). Reported on 05/11/2015    . diclofenac sodium (VOLTAREN) 1 % GEL APPLY 2 GRAMS TOPICALLY 4 TIMES DAILY 100 g 0  . furosemide (LASIX) 40 MG tablet TAKE 1 TABLET BY MOUTH IN THE MORNING AND 1/2 TABLET IN THE EVENING 45 tablet 3  . ibuprofen (ADVIL,MOTRIN) 200 MG tablet Take 200 mg by mouth daily as needed for headache or moderate pain.    Marland Kitchen ipratropium (ATROVENT) 0.03 % nasal spray Place 2 sprays into both nostrils 2 (two) times daily.  11  . Magnesium Oxide 500 MG CAPS Take 2,000 mg by mouth daily.    . metoprolol succinate (TOPROL-XL) 25 MG 24 hr tablet Take 1 tablet (25 mg total) by mouth 2 (two) times daily. 60 tablet 11  . potassium chloride SA (K-DUR,KLOR-CON) 20 MEQ tablet TAKE 2 TABLETS BY MOUTH DAILY 60 tablet 3  . sacubitril-valsartan (ENTRESTO) 24-26 MG Take 1  tablet by mouth 2 (two) times daily. 180 tablet 3  . spironolactone (ALDACTONE) 25 MG tablet Take 1 tablet (25 mg total) by mouth every evening. 90 tablet 1  . tretinoin (RETIN-A) 0.1 % cream Apply 1 application topically every morning.     . warfarin (COUMADIN) 2.5 MG tablet TAKE AS DIRECTED BY COUMADIN CLINIC 90 tablet 1  . zolpidem (AMBIEN) 5 MG tablet TAKE 1 TABLET BY MOUTH EVERY DAY AT BEDTIME AS NEEDED 30 tablet 0   No current facility-administered medications for this encounter.    BP 108/73   Pulse 87   Wt 64.8 kg (142 lb 12.8 oz)   SpO2 98%   BMI 23.76 kg/m    Wt Readings from Last 3 Encounters:  01/30/18 64.8 kg (142 lb 12.8 oz)  12/14/17 63.5 kg (140 lb)  11/17/17 64.7 kg (142 lb 9.6 oz)  General: NAD Neck: No JVD, no thyromegaly or thyroid nodule.  Lungs: Clear to auscultation bilaterally with normal respiratory effort. CV: Nondisplaced PMI.  Heart regular S1/S2, no S3/S4, no murmur.  No peripheral edema.  No carotid bruit.  Normal pedal pulses.  Abdomen: Soft, nontender, no hepatosplenomegaly, no distention.  Skin: Intact without lesions or rashes.  Neurologic: Alert and oriented x 3.  Psych: Normal affect. Extremities: No clubbing or cyanosis.  HEENT: Normal.   Assessment/Plan: 1. Chronic systolic CHF: EF 30-09% with diffuse hypokinesis on 12/16 echo.  Nonischemic cardiomyopathy, probably related to Adriamycin with ovarian cancer treatment.  Cardiac MRI in 1/17 showed EF 29% with normal RV and septal-lateral dyssynchrony.  There was a non-cardiac pattern of LGE in the septum, cannot rule out prior myocarditis based on this pattern.  She has a Medtronic CRT-D device.  Echo 4/19 with EF 30-35%.  NYHA class II symptoms currently.  She looks euvolemic on exam and by Optivol. BP better, no longer having orthostatic-type symptoms.     - Continue Toprol XL 25 mg bid.  - Continue spironolactone 25 daily. BMET today.  - Continue Entresto 24/26 bid.     - Continue Lasix 40  qam/20 qpm and KCl 40 daily.  - I will not titrate her BP-active meds given orthostasis with fall this summer.  2. Atrial fibrillation: Paroxysmal. Lower atrial fibrillation burden on device on 200 mg daily amiodarone.   - Check LFTs today with amiodarone use.  Will also need TSH, free T3, free T4 (mildly elevated TSH at last check).  She will need periodic eye exam as well.  - Continue warfarin. 3. Elevated LFTs: This may be due to CHF versus amiodarone.  Amiodarone decreased to 100 mg daily.  Abdominal US did not show liver abnormalities or gallstones, CT abdomen showed normal liver in 7/17.  HCV/HBV serologies negative, ANA negative, ASMA positive but not thought to be significant.  LFTs back to normal in 3/18.  Amiodarone increased back to 200 mg daily recently.  - Repeat LFTs today to make sure they do not rise again.    Followup in 3 months  [name] 01/30/2018

## 2018-01-31 LAB — T3, FREE: T3, Free: 2.1 pg/mL (ref 2.0–4.4)

## 2018-02-01 ENCOUNTER — Encounter: Payer: Self-pay | Admitting: Cardiology

## 2018-02-05 ENCOUNTER — Encounter (HOSPITAL_COMMUNITY)
Admission: RE | Disposition: A | Discharge status: Home / Self Care | Payer: Self-pay | Source: Ambulatory Visit | Attending: Gastroenterology

## 2018-02-05 ENCOUNTER — Ambulatory Visit (HOSPITAL_COMMUNITY): Payer: Medicare Other | Admitting: Certified Registered Nurse Anesthetist

## 2018-02-05 ENCOUNTER — Other Ambulatory Visit: Payer: Self-pay

## 2018-02-05 ENCOUNTER — Encounter (HOSPITAL_COMMUNITY): Payer: Self-pay | Admitting: *Deleted

## 2018-02-05 ENCOUNTER — Ambulatory Visit (HOSPITAL_COMMUNITY)
Admission: RE | Admit: 2018-02-05 | Discharge: 2018-02-05 | Disposition: A | Discharge status: Home / Self Care | Payer: Medicare Other | Source: Ambulatory Visit | Attending: Gastroenterology | Admitting: Gastroenterology

## 2018-02-05 DIAGNOSIS — K635 Polyp of colon: Secondary | ICD-10-CM

## 2018-02-05 DIAGNOSIS — D125 Benign neoplasm of sigmoid colon: Secondary | ICD-10-CM | POA: Diagnosis not present

## 2018-02-05 DIAGNOSIS — Z980 Intestinal bypass and anastomosis status: Secondary | ICD-10-CM | POA: Insufficient documentation

## 2018-02-05 DIAGNOSIS — I110 Hypertensive heart disease with heart failure: Secondary | ICD-10-CM | POA: Insufficient documentation

## 2018-02-05 DIAGNOSIS — M1990 Unspecified osteoarthritis, unspecified site: Secondary | ICD-10-CM | POA: Diagnosis not present

## 2018-02-05 DIAGNOSIS — K219 Gastro-esophageal reflux disease without esophagitis: Secondary | ICD-10-CM | POA: Diagnosis not present

## 2018-02-05 DIAGNOSIS — I428 Other cardiomyopathies: Secondary | ICD-10-CM | POA: Insufficient documentation

## 2018-02-05 DIAGNOSIS — I5022 Chronic systolic (congestive) heart failure: Secondary | ICD-10-CM | POA: Diagnosis not present

## 2018-02-05 DIAGNOSIS — Z7901 Long term (current) use of anticoagulants: Secondary | ICD-10-CM | POA: Diagnosis not present

## 2018-02-05 DIAGNOSIS — K640 First degree hemorrhoids: Secondary | ICD-10-CM | POA: Diagnosis not present

## 2018-02-05 DIAGNOSIS — Z86010 Personal history of colonic polyps: Secondary | ICD-10-CM

## 2018-02-05 DIAGNOSIS — D123 Benign neoplasm of transverse colon: Secondary | ICD-10-CM | POA: Diagnosis not present

## 2018-02-05 DIAGNOSIS — Z1211 Encounter for screening for malignant neoplasm of colon: Secondary | ICD-10-CM | POA: Insufficient documentation

## 2018-02-05 DIAGNOSIS — I480 Paroxysmal atrial fibrillation: Secondary | ICD-10-CM | POA: Insufficient documentation

## 2018-02-05 DIAGNOSIS — Z85038 Personal history of other malignant neoplasm of large intestine: Secondary | ICD-10-CM | POA: Diagnosis not present

## 2018-02-05 DIAGNOSIS — Z8543 Personal history of malignant neoplasm of ovary: Secondary | ICD-10-CM | POA: Diagnosis not present

## 2018-02-05 DIAGNOSIS — Z87891 Personal history of nicotine dependence: Secondary | ICD-10-CM | POA: Insufficient documentation

## 2018-02-05 HISTORY — PX: POLYPECTOMY: SHX5525

## 2018-02-05 HISTORY — PX: COLONOSCOPY WITH PROPOFOL: SHX5780

## 2018-02-05 SURGERY — COLONOSCOPY WITH PROPOFOL
Anesthesia: Monitor Anesthesia Care

## 2018-02-05 MED ORDER — SODIUM CHLORIDE 0.9 % IV SOLN: INTRAVENOUS | Status: DC

## 2018-02-05 MED ORDER — PROPOFOL 10 MG/ML IV BOLUS
INTRAVENOUS | Status: DC | PRN
  Administered 2018-02-05: 20 mg via INTRAVENOUS
  Administered 2018-02-05 (×2): 10 mg via INTRAVENOUS

## 2018-02-05 MED ORDER — PROPOFOL 500 MG/50ML IV EMUL
INTRAVENOUS | Status: DC | PRN
  Administered 2018-02-05: 75 ug/kg/min via INTRAVENOUS

## 2018-02-05 MED ORDER — LIDOCAINE 2% (20 MG/ML) 5 ML SYRINGE
INTRAMUSCULAR | Status: DC | PRN
  Administered 2018-02-05: 60 mg via INTRAVENOUS

## 2018-02-05 MED ORDER — PROPOFOL 10 MG/ML IV BOLUS
INTRAVENOUS | Status: AC
  Filled 2018-02-05: qty 60

## 2018-02-05 MED ORDER — EPHEDRINE SULFATE-NACL 50-0.9 MG/10ML-% IV SOSY
PREFILLED_SYRINGE | INTRAVENOUS | Status: DC | PRN
  Administered 2018-02-05 (×2): 10 mg via INTRAVENOUS

## 2018-02-05 MED ORDER — LACTATED RINGERS IV SOLN
INTRAVENOUS | Status: DC | PRN
  Administered 2018-02-05: 1000 mL via INTRAVENOUS

## 2018-02-05 SURGICAL SUPPLY — 21 items

## 2018-02-05 NOTE — Op Note (Signed)
[facility] Patient Name: Cynthia Frank Procedure Date: 02/05/2018 MRN: [id number] Attending MD: Ladene Artist , MD Date of Birth: December 03, 1935 CSN: [id number] Age: 82 Admit Type: Outpatient Procedure:                Colonoscopy Indications:              High risk colon cancer surveillance: Personal                            history of colon cancer Providers:                Pricilla Riffle. Fuller Plan, MD, Angus Seller, Tinnie Gens,                            Technician, Adair Laundry, CRNA Referring MD:              Medicines:                Monitored Anesthesia Care Complications:            No immediate complications. Estimated blood loss:                            None. Estimated Blood Loss:     Estimated blood loss: none. Procedure:                Pre-Anesthesia Assessment:                           - Prior to the procedure, a History and Physical                            was performed, and patient medications and                            allergies were reviewed. The patient's tolerance of                            previous anesthesia was also reviewed. The risks                            and benefits of the procedure and the sedation                            options and risks were discussed with the patient.                            All questions were answered, and informed consent                            was obtained. Prior Anticoagulants: The patient has                            taken Coumadin (warfarin), last dose was 5 days                            prior  to procedure. ASA Grade Assessment: III - A                            patient with severe systemic disease. After                            reviewing the risks and benefits, the patient was                            deemed in satisfactory condition to undergo the                            procedure.                           After obtaining informed consent, the colonoscope             was passed under direct vision. Throughout the                            procedure, the patient's blood pressure, pulse, and                            oxygen saturations were monitored continuously. The                            [id number]-H190DL ([id number]) Olympus peds colonoscope was                            introduced through the anus and advanced to the the                            ileocolonic anastomosis. The rectum was                            photographed. The quality of the bowel preparation                            was good. The colonoscopy was performed without                            difficulty. The patient tolerated the procedure                            well. Scope In: 9:44:02 AM Scope Out: 10:12:36 AM Scope Withdrawal Time: 0 hours 24 minutes 21 seconds  Total Procedure Duration: 0 hours 28 minutes 34 seconds  Findings:      The perianal and digital rectal examinations were normal.      A 8 mm polyp was found in the transverse colon. The polyp was sessile.       The polyp was removed with a cold snare. Resection and retrieval were       complete. Persistent oozing at polypectomy site. To stop active       bleeding, two hemostatic clips were successfully placed (MR  conditional). There was no bleeding at the end of the procedure.      A 14 mm polyp was found in the sigmoid colon. The polyp was sessile. The       polyp was removed with a hot snare. Resection and retrieval were       complete.      There was evidence of a prior end-to-side ileo-colonic anastomosis in       the ascending colon. This was patent and was characterized by healthy       appearing mucosa. The anastomosis was not traversed.      Internal hemorrhoids were found during retroflexion. The hemorrhoids       were small and Grade I (internal hemorrhoids that do not prolapse).      The exam was otherwise without abnormality on direct and retroflexion       views. Impression:                - One 8 mm polyp in the transverse colon, removed                            with a cold snare. Resected and retrieved. Clips                            (MR conditional) were placed.                           - One 14 mm polyp in the sigmoid colon, removed                            with a hot snare. Resected and retrieved.                           - Internal hemorrhoids.                           - Patent end-to-side ileo-colonic anastomosis,                            characterized by healthy appearing mucosa.                           - The exam was otherwise normal on direct and                            retroflexed views. Moderate Sedation:      N/A- Per Anesthesia Care Recommendation:           - Repeat colonoscopy in 2 years for surveillance.                           - Resume Coumadin (warfarin) tomorrow at prior                            dose. Refer to managing physician for further                            adjustment of therapy.                           -  Patient has a contact number available for                            emergencies. The signs and symptoms of potential                            delayed complications were discussed with the                            patient. Return to normal activities tomorrow.                            Written discharge instructions were provided to the                            patient.                           - Resume previous diet.                           - Continue present medications.                           - Await pathology results.                           - No aspirin, ibuprofen, naproxen, or other                            non-steroidal anti-inflammatory drugs for 2 weeks                            after polyp removal. Procedure Code(s):        --- Professional ---                           [phone], Colonoscopy, flexible; with removal of                            tumor(s), polyp(s), or other lesion(s) by  snare                            technique Diagnosis Code(s):        --- Professional ---                           G25.427, Personal history of other malignant                            neoplasm of large intestine                           D12.3, Benign neoplasm of transverse colon (hepatic                            flexure or splenic  flexure)                           D12.5, Benign neoplasm of sigmoid colon                           K64.0, First degree hemorrhoids                           Z98.0, Intestinal bypass and anastomosis status CPT copyright 2018 [employer]. All rights reserved. The codes documented in this report are preliminary and upon coder review may  be revised to meet current compliance requirements. Ladene Artist, MD 02/05/2018 10:20:02 AM This report has been signed electronically. Number of Addenda: 0

## 2018-02-05 NOTE — Interval H&P Note (Signed)
History and Physical Interval Note:  02/05/2018 9:10 AM  Cynthia Frank  has presented today for surgery, with the diagnosis of personal hx of colon cancer, hx of polyps/al  The various methods of treatment have been discussed with the patient and family. After consideration of risks, benefits and other options for treatment, the patient has consented to  Procedure(s): COLONOSCOPY WITH PROPOFOL (N/A) as a surgical intervention .  The patient's history has been reviewed, patient examined, no change in status, stable for surgery.  I have reviewed the patient's chart and labs.  Questions were answered to the patient's satisfaction.     Pricilla Riffle. Fuller Plan

## 2018-02-05 NOTE — Transfer of Care (Signed)
Immediate Anesthesia Transfer of Care Note  Patient: Cynthia Frank  Procedure(s) Performed: COLONOSCOPY WITH PROPOFOL Hemostasis Clip (N/A ) POLYPECTOMY  Patient Location: Endoscopy Unit  Anesthesia Type:MAC  Level of Consciousness: drowsy and patient cooperative  Airway & Oxygen Therapy: Patient Spontanous Breathing and Patient connected to face mask oxygen  Post-op Assessment: Report given to RN and Post -op Vital signs reviewed and stable  Post vital signs: Reviewed and stable  Last Vitals:  Vitals Value Taken Time  BP    Temp    Pulse    Resp    SpO2      Last Pain:  Vitals:   02/05/18 0807  TempSrc: Oral  PainSc:          Complications: No apparent anesthesia complications

## 2018-02-05 NOTE — Anesthesia Postprocedure Evaluation (Signed)
Anesthesia Post Note  Patient: Cynthia Frank  Procedure(s) Performed: COLONOSCOPY WITH PROPOFOL Hemostasis Clip (N/A ) POLYPECTOMY     Patient location during evaluation: PACU Anesthesia Type: MAC Level of consciousness: awake and alert Pain management: pain level controlled Vital Signs Assessment: post-procedure vital signs reviewed and stable Respiratory status: spontaneous breathing, nonlabored ventilation, respiratory function stable and patient connected to nasal cannula oxygen Cardiovascular status: stable and blood pressure returned to baseline Postop Assessment: no apparent nausea or vomiting Anesthetic complications: no    Last Vitals:  Vitals:   02/05/18 1030 02/05/18 1040  BP: (!) 92/41 (!) 102/53  Pulse: 67 65  Resp: 18 15  Temp:    SpO2: 97% 98%    Last Pain:  Vitals:   02/05/18 1040  TempSrc:   PainSc: 0-No pain                 Effie Berkshire

## 2018-02-05 NOTE — Anesthesia Preprocedure Evaluation (Addendum)
Anesthesia Evaluation  Patient identified by MRN, date of birth, ID band Patient awake    Reviewed: Allergy & Precautions, NPO status , Patient's Chart, lab work & pertinent test results  Airway Mallampati: I  TM Distance: >3 FB Neck ROM: Full    Dental  (+) Teeth Intact, Dental Advisory Given   Pulmonary former smoker,    breath sounds clear to auscultation       Cardiovascular hypertension, Pt. on home beta blockers + CAD and +CHF  + dysrhythmias Atrial Fibrillation + pacemaker + Cardiac Defibrillator  Rhythm:Regular Rate:Normal     Neuro/Psych negative neurological ROS     GI/Hepatic Neg liver ROS, GERD  ,  Endo/Other  negative endocrine ROS  Renal/GU negative Renal ROS     Musculoskeletal  (+) Arthritis ,   Abdominal Normal abdominal exam  (+)   Peds  Hematology negative hematology ROS (+)   Anesthesia Other Findings - HLD  Reproductive/Obstetrics                            Lab Results  Component Value Date   WBC 9.4 11/14/2017   HGB 11.6 (L) 11/14/2017   HCT 36.1 11/14/2017   MCV 99.2 11/14/2017   PLT 141 (L) 11/14/2017   Lab Results  Component Value Date   CREATININE 1.23 (H) 01/30/2018   BUN 16 01/30/2018   NA 137 01/30/2018   K 4.1 01/30/2018   CL 105 01/30/2018   CO2 24 01/30/2018   Lab Results  Component Value Date   INR 2.3 01/29/2018   INR 2.8 01/01/2018   INR 2.9 12/11/2017   EKG: Atrial Paced.  Echo: - Left ventricle: The cavity size was normal. Wall thickness was   normal. Systolic function was moderately to severely reduced. The   estimated ejection fraction was in the range of 30% to 35%.   Diffuse hypokinesis. Doppler parameters are consistent with high   ventricular filling pressure. - Aortic valve: There was mild regurgitation. - Mitral valve: Calcified annulus. Mildly thickened leaflets .   There was mild regurgitation. - Left atrium: The atrium  was mildly dilated. - Pulmonary arteries: Systolic pressure was mildly increased. PA   peak pressure: 37 mm Hg (S).   Anesthesia Physical Anesthesia Plan  ASA: IV  Anesthesia Plan: MAC   Post-op Pain Management:    Induction: Intravenous  PONV Risk Score and Plan: 2 and Ondansetron and Propofol infusion  Airway Management Planned: Simple Face Mask  Additional Equipment: None  Intra-op Plan:   Post-operative Plan:   Informed Consent: I have reviewed the patients History and Physical, chart, labs and discussed the procedure including the risks, benefits and alternatives for the proposed anesthesia with the patient or authorized representative who has indicated his/her understanding and acceptance.     Plan Discussed with: CRNA  Anesthesia Plan Comments:        Anesthesia Quick Evaluation

## 2018-02-05 NOTE — Discharge Instructions (Signed)
YOU HAD AN ENDOSCOPIC PROCEDURE TODAY: Refer to the procedure report and other information in the discharge instructions given to you for any specific questions about what was found during the examination. If this information does not answer your questions, please call [place] office at [phone] to clarify.  ° °YOU SHOULD EXPECT: Some feelings of bloating in the abdomen. Passage of more gas than usual. Walking can help get rid of the air that was put into your GI tract during the procedure and reduce the bloating. If you had a lower endoscopy (such as a colonoscopy or flexible sigmoidoscopy) you may notice spotting of blood in your stool or on the toilet paper. Some abdominal soreness may be present for a day or two, also. ° °DIET: Your first meal following the procedure should be a light meal and then it is ok to progress to your normal diet. A half-sandwich or bowl of soup is an example of a good first meal. Heavy or fried foods are harder to digest and may make you feel nauseous or bloated. Drink plenty of fluids but you should avoid alcoholic beverages for 24 hours. If you had a esophageal dilation, please see attached instructions for diet.   ° °ACTIVITY: Your care partner should take you home directly after the procedure. You should plan to take it easy, moving slowly for the rest of the day. You can resume normal activity the day after the procedure however YOU SHOULD NOT DRIVE, use power tools, machinery or perform tasks that involve climbing or major physical exertion for 24 hours (because of the sedation medicines used during the test).  ° °SYMPTOMS TO REPORT IMMEDIATELY: °A gastroenterologist can be reached at any hour. Please call [phone]  for any of the following symptoms:  °Following lower endoscopy (colonoscopy, flexible sigmoidoscopy) °Excessive amounts of blood in the stool  °Significant tenderness, worsening of abdominal pains  °Swelling of the abdomen that is new, acute  °Fever of 100° or  higher  °Following upper endoscopy (EGD, EUS, ERCP, esophageal dilation) °Vomiting of blood or coffee ground material  °New, significant abdominal pain  °New, significant chest pain or pain under the shoulder blades  °Painful or persistently difficult swallowing  °New shortness of breath  °Black, tarry-looking or red, bloody stools ° °FOLLOW UP:  °If any biopsies were taken you will be contacted by phone or by letter within the next 1-3 weeks. Call [phone]  if you have not heard about the biopsies in 3 weeks.  °Please also call with any specific questions about appointments or follow up tests. ° °

## 2018-02-07 ENCOUNTER — Encounter (HOSPITAL_COMMUNITY): Payer: Self-pay | Admitting: Gastroenterology

## 2018-02-12 ENCOUNTER — Encounter: Payer: Self-pay | Admitting: Gastroenterology

## 2018-02-14 ENCOUNTER — Ambulatory Visit (INDEPENDENT_AMBULATORY_CARE_PROVIDER_SITE_OTHER): Payer: Medicare Other | Admitting: *Deleted

## 2018-02-14 DIAGNOSIS — I480 Paroxysmal atrial fibrillation: Secondary | ICD-10-CM | POA: Diagnosis not present

## 2018-02-14 LAB — POCT INR: INR: 1.6 — AB (ref 2.0–3.0)

## 2018-02-14 NOTE — Patient Instructions (Signed)
Description   Today take 1.5 tablets, tomorrow take 1 tablet, then Continue taking 1 tablet daily except 1/2 tablet on Tuesdays, Thursdays and Sundays. Recheck INR in 2 weeks.  Call us with any new medications # [phone].

## 2018-02-21 ENCOUNTER — Other Ambulatory Visit (HOSPITAL_COMMUNITY): Payer: Self-pay | Admitting: Cardiology

## 2018-02-21 DIAGNOSIS — M7662 Achilles tendinitis, left leg: Secondary | ICD-10-CM | POA: Diagnosis not present

## 2018-02-21 MED ORDER — FUROSEMIDE 20 MG PO TABS: ORAL_TABLET | ORAL | 3 refills | Status: DC

## 2018-02-23 LAB — CUP PACEART REMOTE DEVICE CHECK
Battery Remaining Longevity: 47 mo
Battery Voltage: 2.96 V
Brady Statistic AP VP Percent: 93.69 %
Brady Statistic AP VS Percent: 0.61 %
Brady Statistic AS VP Percent: 4.99 %
Brady Statistic AS VS Percent: 0.71 %
Brady Statistic RA Percent Paced: 94.17 %
Brady Statistic RV Percent Paced: 96.4 %
Date Time Interrogation Session: 20191013213308
HighPow Impedance: 66 Ohm
Implantable Lead Implant Date: 20170328
Implantable Lead Implant Date: 20170328
Implantable Lead Implant Date: 20170328
Implantable Lead Location: 753858
Implantable Lead Location: 753859
Implantable Lead Location: 753860
Implantable Lead Model: 4598
Implantable Lead Model: 5076
Implantable Lead Model: 6935
Implantable Pulse Generator Implant Date: 20170328
Lead Channel Impedance Value: 304 Ohm
Lead Channel Impedance Value: 304 Ohm
Lead Channel Impedance Value: 342 Ohm
Lead Channel Impedance Value: 361 Ohm
Lead Channel Impedance Value: 399 Ohm
Lead Channel Impedance Value: 475 Ohm
Lead Channel Impedance Value: 475 Ohm
Lead Channel Impedance Value: 513 Ohm
Lead Channel Impedance Value: 513 Ohm
Lead Channel Impedance Value: 608 Ohm
Lead Channel Impedance Value: 779 Ohm
Lead Channel Impedance Value: 817 Ohm
Lead Channel Impedance Value: 817 Ohm
Lead Channel Pacing Threshold Amplitude: 0.75 V
Lead Channel Pacing Threshold Amplitude: 0.75 V
Lead Channel Pacing Threshold Amplitude: 0.875 V
Lead Channel Pacing Threshold Pulse Width: 0.4 ms
Lead Channel Pacing Threshold Pulse Width: 0.4 ms
Lead Channel Pacing Threshold Pulse Width: 0.4 ms
Lead Channel Sensing Intrinsic Amplitude: 0.5 mV
Lead Channel Sensing Intrinsic Amplitude: 0.5 mV
Lead Channel Sensing Intrinsic Amplitude: 10.375 mV
Lead Channel Sensing Intrinsic Amplitude: 10.375 mV
Lead Channel Setting Pacing Amplitude: 1.75 V
Lead Channel Setting Pacing Amplitude: 2 V
Lead Channel Setting Pacing Amplitude: 2.5 V
Lead Channel Setting Pacing Pulse Width: 0.4 ms
Lead Channel Setting Pacing Pulse Width: 0.4 ms
Lead Channel Setting Sensing Sensitivity: 0.3 mV

## 2018-03-02 ENCOUNTER — Ambulatory Visit (INDEPENDENT_AMBULATORY_CARE_PROVIDER_SITE_OTHER): Payer: Medicare Other | Admitting: *Deleted

## 2018-03-02 DIAGNOSIS — Z5181 Encounter for therapeutic drug level monitoring: Secondary | ICD-10-CM | POA: Diagnosis not present

## 2018-03-02 DIAGNOSIS — I480 Paroxysmal atrial fibrillation: Secondary | ICD-10-CM

## 2018-03-02 LAB — POCT INR: INR: 2.4 (ref 2.0–3.0)

## 2018-03-02 NOTE — Patient Instructions (Signed)
Description   Continue taking 1 tablet daily except 1/2 tablet on Tuesdays, Thursdays and Sundays. Recheck INR in 4 weeks.  Call us with any new medications # [phone].

## 2018-03-07 DIAGNOSIS — M7662 Achilles tendinitis, left leg: Secondary | ICD-10-CM | POA: Diagnosis not present

## 2018-03-20 DIAGNOSIS — M7662 Achilles tendinitis, left leg: Secondary | ICD-10-CM | POA: Diagnosis not present

## 2018-03-27 DIAGNOSIS — M7662 Achilles tendinitis, left leg: Secondary | ICD-10-CM | POA: Diagnosis not present

## 2018-03-29 ENCOUNTER — Other Ambulatory Visit (HOSPITAL_COMMUNITY): Payer: Self-pay

## 2018-03-29 MED ORDER — SPIRONOLACTONE 25 MG PO TABS: 25.0000 mg | ORAL_TABLET | Freq: Every evening | ORAL | 1 refills | Status: DC

## 2018-03-30 ENCOUNTER — Ambulatory Visit (INDEPENDENT_AMBULATORY_CARE_PROVIDER_SITE_OTHER): Payer: Medicare Other

## 2018-03-30 DIAGNOSIS — Z5181 Encounter for therapeutic drug level monitoring: Secondary | ICD-10-CM

## 2018-03-30 DIAGNOSIS — I480 Paroxysmal atrial fibrillation: Secondary | ICD-10-CM | POA: Diagnosis not present

## 2018-03-30 LAB — POCT INR: INR: 2.5 (ref 2.0–3.0)

## 2018-03-30 NOTE — Patient Instructions (Signed)
Description   Continue taking 1 tablet daily except 1/2 tablet on Tuesdays, Thursdays and Sundays. Recheck INR in 4 weeks.  Call us with any new medications # [phone].

## 2018-04-03 DIAGNOSIS — M7662 Achilles tendinitis, left leg: Secondary | ICD-10-CM | POA: Diagnosis not present

## 2018-04-23 ENCOUNTER — Other Ambulatory Visit: Payer: Self-pay | Admitting: Internal Medicine

## 2018-04-26 ENCOUNTER — Ambulatory Visit (INDEPENDENT_AMBULATORY_CARE_PROVIDER_SITE_OTHER): Payer: Medicare Other | Admitting: *Deleted

## 2018-04-26 DIAGNOSIS — Z5181 Encounter for therapeutic drug level monitoring: Secondary | ICD-10-CM | POA: Diagnosis not present

## 2018-04-26 DIAGNOSIS — I480 Paroxysmal atrial fibrillation: Secondary | ICD-10-CM | POA: Diagnosis not present

## 2018-04-26 LAB — POCT INR: INR: 2.5 (ref 2.0–3.0)

## 2018-04-26 NOTE — Patient Instructions (Signed)
Description   Continue taking 1 tablet daily except 1/2 tablet on Tuesdays, Thursdays and Sundays. Recheck INR in 6 weeks.  Call us with any new medications # [phone].

## 2018-04-30 ENCOUNTER — Ambulatory Visit (INDEPENDENT_AMBULATORY_CARE_PROVIDER_SITE_OTHER): Payer: Medicare Other

## 2018-04-30 DIAGNOSIS — I428 Other cardiomyopathies: Secondary | ICD-10-CM | POA: Diagnosis not present

## 2018-04-30 DIAGNOSIS — I5022 Chronic systolic (congestive) heart failure: Secondary | ICD-10-CM

## 2018-05-01 NOTE — Progress Notes (Signed)
Remote ICD transmission.   

## 2018-05-02 ENCOUNTER — Ambulatory Visit (HOSPITAL_COMMUNITY)
Admission: RE | Admit: 2018-05-02 | Discharge: 2018-05-02 | Disposition: A | Discharge status: Home / Self Care | Payer: Medicare Other | Source: Ambulatory Visit | Attending: Cardiology | Admitting: Cardiology

## 2018-05-02 ENCOUNTER — Encounter (HOSPITAL_COMMUNITY): Payer: Self-pay | Admitting: Cardiology

## 2018-05-02 VITALS — BP 106/70 | HR 67 | Wt 141.0 lb

## 2018-05-02 DIAGNOSIS — Z950 Presence of cardiac pacemaker: Secondary | ICD-10-CM | POA: Insufficient documentation

## 2018-05-02 DIAGNOSIS — I428 Other cardiomyopathies: Secondary | ICD-10-CM | POA: Diagnosis not present

## 2018-05-02 DIAGNOSIS — I110 Hypertensive heart disease with heart failure: Secondary | ICD-10-CM | POA: Diagnosis not present

## 2018-05-02 DIAGNOSIS — I480 Paroxysmal atrial fibrillation: Secondary | ICD-10-CM | POA: Insufficient documentation

## 2018-05-02 DIAGNOSIS — Z87891 Personal history of nicotine dependence: Secondary | ICD-10-CM | POA: Diagnosis not present

## 2018-05-02 DIAGNOSIS — Z8543 Personal history of malignant neoplasm of ovary: Secondary | ICD-10-CM | POA: Diagnosis not present

## 2018-05-02 DIAGNOSIS — Z7901 Long term (current) use of anticoagulants: Secondary | ICD-10-CM | POA: Insufficient documentation

## 2018-05-02 DIAGNOSIS — Z85038 Personal history of other malignant neoplasm of large intestine: Secondary | ICD-10-CM | POA: Insufficient documentation

## 2018-05-02 DIAGNOSIS — Z79899 Other long term (current) drug therapy: Secondary | ICD-10-CM | POA: Insufficient documentation

## 2018-05-02 DIAGNOSIS — I5022 Chronic systolic (congestive) heart failure: Secondary | ICD-10-CM | POA: Insufficient documentation

## 2018-05-02 LAB — CUP PACEART REMOTE DEVICE CHECK
Battery Remaining Longevity: 46 mo
Battery Voltage: 2.96 V
Brady Statistic AP VP Percent: 91.64 %
Brady Statistic AP VS Percent: 0.45 %
Brady Statistic AS VP Percent: 7.3 %
Brady Statistic AS VS Percent: 0.61 %
Brady Statistic RA Percent Paced: 91.96 %
Brady Statistic RV Percent Paced: 95.91 %
Date Time Interrogation Session: 20200113151938
HighPow Impedance: 69 Ohm
Implantable Lead Implant Date: 20170328
Implantable Lead Implant Date: 20170328
Implantable Lead Implant Date: 20170328
Implantable Lead Location: 753858
Implantable Lead Location: 753859
Implantable Lead Location: 753860
Implantable Lead Model: 4598
Implantable Lead Model: 5076
Implantable Lead Model: 6935
Implantable Pulse Generator Implant Date: 20170328
Lead Channel Impedance Value: 342 Ohm
Lead Channel Impedance Value: 342 Ohm
Lead Channel Impedance Value: 361 Ohm
Lead Channel Impedance Value: 418 Ohm
Lead Channel Impedance Value: 475 Ohm
Lead Channel Impedance Value: 475 Ohm
Lead Channel Impedance Value: 513 Ohm
Lead Channel Impedance Value: 551 Ohm
Lead Channel Impedance Value: 608 Ohm
Lead Channel Impedance Value: 646 Ohm
Lead Channel Impedance Value: 836 Ohm
Lead Channel Impedance Value: 874 Ohm
Lead Channel Impedance Value: 874 Ohm
Lead Channel Pacing Threshold Amplitude: 0.75 V
Lead Channel Pacing Threshold Amplitude: 0.875 V
Lead Channel Pacing Threshold Amplitude: 1 V
Lead Channel Pacing Threshold Pulse Width: 0.4 ms
Lead Channel Pacing Threshold Pulse Width: 0.4 ms
Lead Channel Pacing Threshold Pulse Width: 0.4 ms
Lead Channel Sensing Intrinsic Amplitude: 0.375 mV
Lead Channel Sensing Intrinsic Amplitude: 0.375 mV
Lead Channel Sensing Intrinsic Amplitude: 10.5 mV
Lead Channel Sensing Intrinsic Amplitude: 10.5 mV
Lead Channel Setting Pacing Amplitude: 2 V
Lead Channel Setting Pacing Amplitude: 2 V
Lead Channel Setting Pacing Amplitude: 2.5 V
Lead Channel Setting Pacing Pulse Width: 0.4 ms
Lead Channel Setting Pacing Pulse Width: 0.4 ms
Lead Channel Setting Sensing Sensitivity: 0.3 mV

## 2018-05-02 LAB — COMPREHENSIVE METABOLIC PANEL
ALT: 51 U/L — ABNORMAL HIGH (ref 0–44)
AST: 63 U/L — ABNORMAL HIGH (ref 15–41)
Albumin: 3.8 g/dL (ref 3.5–5.0)
Alkaline Phosphatase: 62 U/L (ref 38–126)
Anion gap: 13 (ref 5–15)
BUN: 16 mg/dL (ref 8–23)
CO2: 22 mmol/L (ref 22–32)
Calcium: 8.6 mg/dL — ABNORMAL LOW (ref 8.9–10.3)
Chloride: 101 mmol/L (ref 98–111)
Creatinine, Ser: 1.15 mg/dL — ABNORMAL HIGH (ref 0.44–1.00)
GFR calc Af Amer: 52 mL/min — ABNORMAL LOW (ref >60)
GFR calc non Af Amer: 45 mL/min — ABNORMAL LOW (ref >60)
Glucose, Bld: 101 mg/dL — ABNORMAL HIGH (ref 70–99)
Potassium: 5.3 mmol/L — ABNORMAL HIGH (ref 3.5–5.1)
Sodium: 136 mmol/L (ref 135–145)
Total Bilirubin: 1.6 mg/dL — ABNORMAL HIGH (ref 0.3–1.2)
Total Protein: 6.6 g/dL (ref 6.5–8.1)

## 2018-05-02 LAB — LIPID PANEL
Cholesterol: 138 mg/dL (ref 0–200)
HDL: 79 mg/dL (ref >40)
LDL Cholesterol: 41 mg/dL (ref 0–99)
Total CHOL/HDL Ratio: 1.7 RATIO
Triglycerides: 90 mg/dL (ref <150)
VLDL: 18 mg/dL (ref 0–40)

## 2018-05-02 LAB — TSH: TSH: 6.139 u[IU]/mL — ABNORMAL HIGH (ref 0.350–4.500)

## 2018-05-02 LAB — T4, FREE: Free T4: 0.75 ng/dL — ABNORMAL LOW (ref 0.82–1.77)

## 2018-05-02 NOTE — Progress Notes (Signed)
Patient ID: Cynthia Frank, female   DOB: 09/27/1935, 83 y.o.   MRN: [id number]    Advanced Heart Failure Clinic Note   PCP: Dr. Sharlet Salina HF Cardiology: Dr. Aundra Dubin  83 y.o. with history of chronic systolic CHF/nonischemic cardiomyopathy, paroxysmal atrial fibrillation, and prior ovarian and colon cancers presents for CHF followup. She has had a cardiomyopathy known for > 15 years now. Cardiac cath at diagnosis showed mild nonobstructive disease.  EF has been persistently low.  Cause has been thought to be Adriamycin; she received this as part of her ovarian cancer treatment. She has had paroxysmal atrial fibrillation and has remained in NSR with amiodarone use.  No recent atrial fibrillation symptoms, typically feels prolonged palpitations thought to be atrial fibrillation 2-3 times/year.   She had had dyspnea with moderate to heavy exertion for 4-5 years prior to initial appt.  However, just before her initial appt, her symptoms had worsened.  She developed shortness of breath walking short distances on flat ground.  Lasix was increased to 40 mg bid and she lost weight and felt better.     Cardiac MRI was done in 1/17, showing EF 29% with normal RV.  Septal-lateral dyssynchrony was present and there was a non-coronary pattern of LGE in the septum.  LFTs were noted to be mildly elevated.  Amiodarone was decreased to 100 mg daily.   She had Medtronic CRT-D placed in 3/17. Repeat echo 8/17 showed EF 35% with diffuse hypokinesis, normal RV.  Echo in 4/19 showed EF 30-35%, mild AI.   On 11/12/17, she stood up, got lightheaded and passed out briefly with a fall.  Her ICD did not discharge.  She went to the ER and was found to have right C5 transverse process fracture.  She is wearing a cervical collar.  She says that she has had lightheadedness with standing for years, comes and goes.  She does not feel like she has been more lightheaded than is her norm, but that one episode on 7/28 led to syncope.  I  decreased her Toprol XL.   Today, she says that she is doing well. No dyspnea with exertion, has had more energy recently. No palpitations.  No orthopnea/PND.  Occasional mild lightheadedness with standing.    Medtronic device interrogation: Fluid index < threshold with stable impedance, >99% BiV pacing, 1 atrial fibrillation episode in early 12/19.    Labs (1/17): K 4.2, creatinine 1.14, BNP 855 Labs (3/17): SPEP negative, BNP 341, K 4.5, creatinine 1.27, AST 62, ALT 74, TSH normal with low free T3 and normal free T4, HCT 46.5 Labs (4/17): AST 64, ALT 67, BNP 278, K 3.6, creatinine 1.21 Labs (5/17): K 3.4, creatinine 1.39, AST 53, ALT 61, HBV/HCV negative, TSH mildly elevated, BNP 306 Labs (6/17): K 4.4, creatinine 1.36, free T4 normal, free T3 mildly decreased, AST 50, ALT 53. Labs (7/17): ANA negative, ASMA positive Labs (8/17): AST 45, ALT 37, K 3.8, creatinine 1.19, TSH normal Labs (2/18): TSH normal, LDL 47, LFTs, normal Labs (3/18): K normal, creatinine 1.23.  Labs (7/18): K 4, creatinine 1.32, LFTs normal Labs (7/19): K 3.8, creatinine 1.25, hgb 11.6 Labs (8/19): TSH mildly elevated, K 4.2, creatinine 1.22 Labs (10/19): K 4.1, creatinine 1.23, LFTs normal, TSH mildly elevated with normal free T3 and free T4.   ECG (personally reviewed): A-BiV sequential pacing  PMH: 1. Chronic systolic CHF: Nonischemic cardiomyopathy, thought to be related to adriamycin that she had for treatment of ovarian cancer.  NICM diagnosed ~  2001.  - LHC ~ 2001 with 30% stenosis D1.   - Echo (2013) EF 25-30%.  - Echo (10/14) with EF 25-30%. - Echo (12/16) with EF 20-25%, mild LV dilation, restrictive diastolic function, moderate MR, severe LAE. - Cardiac MRI (1/17): Moderately dilated LV with EF 29%. Diffuse hypokinesis looking worse in the septal wall. Septal-lateral dyssynchrony. Normal RV size and systolic function.  Moderate MR with tethered posterior leaflet.  Mid-wall LGE in the basal to mid septum.  This is not a coronary disease pattern. Could be suggestive of prior myocarditis. - Medtronic CRT-D placed 3/17.  - Echo (8/17): EF 35%, diffuse hypokinesis, normal RV size and systolic function, moderate MR.  - Echo (4/19): EF 30-35%, mild AI.  2. Atrial fibrillation: Paroxysmal.  3. HTN 4. Ovarian cancer: 1985, s/p surgery. Received Adriamycin-based chemotherapy.  5. Colon cancer: 2007, s/p surgery.  6. PVCs 7. GERD 8. PFTs (8/16) without significant abnormality. 9. Prior syncope 10. Elevated transaminases: Uncertain etiology.   SH: Married, lives in [place], prior smoker.   FH: Father with MI.   ROS: All systems reviewed and negative except as per HPI.   Current Outpatient Medications  Medication Sig Dispense Refill  . amiodarone (PACERONE) 200 MG tablet Take 1 tablet (200 mg total) by mouth daily. 90 tablet 3  . atorvastatin (LIPITOR) 40 MG tablet TAKE ONE TABLET BY MOUTH ONE TIME DAILY  90 tablet 0  . citalopram (CELEXA) 20 MG tablet Take 1 tablet (20 mg total) by mouth daily. 90 tablet 3  . Coenzyme Q10 (COQ10) 100 MG CAPS Take 100 mg by mouth daily.     Marland Kitchen conjugated estrogens (PREMARIN) vaginal cream Place 1 Applicatorful vaginally as needed (dyspareunia (Use as directed)). Reported on 05/11/2015    . diclofenac sodium (VOLTAREN) 1 % GEL APPLY 2 GRAMS TOPICALLY 4 TIMES DAILY 100 g 0  . furosemide (LASIX) 20 MG tablet Take 2 (40 mg) in the morning and 1 (20 mg) in the evening. 100 tablet 3  . ipratropium (ATROVENT) 0.03 % nasal spray Place 2 sprays into both nostrils 2 (two) times daily.  11  . Magnesium Oxide 500 MG CAPS Take 2,000 mg by mouth daily.    . metoprolol succinate (TOPROL-XL) 25 MG 24 hr tablet Take 1 tablet (25 mg total) by mouth 2 (two) times daily. 60 tablet 11  . potassium chloride SA (K-DUR,KLOR-CON) 20 MEQ tablet TAKE 2 TABLETS BY MOUTH DAILY 60 tablet 5  . sacubitril-valsartan (ENTRESTO) 24-26 MG Take 1 tablet by mouth 2 (two) times daily. 180 tablet 3  .  spironolactone (ALDACTONE) 25 MG tablet Take 1 tablet (25 mg total) by mouth every evening. 90 tablet 1  . tretinoin (RETIN-A) 0.1 % cream Apply 1 application topically every morning.     . warfarin (COUMADIN) 2.5 MG tablet TAKE AS DIRECTED BY COUMADIN CLINIC 90 tablet 1  . zolpidem (AMBIEN) 5 MG tablet TAKE 1 TABLET BY MOUTH EVERY DAY AT BEDTIME AS NEEDED 30 tablet 0  . ibuprofen (ADVIL,MOTRIN) 200 MG tablet Take 200 mg by mouth daily as needed for headache or moderate pain.     No current facility-administered medications for this encounter.    BP 106/70   Pulse 67   Wt 64 kg (141 lb)   SpO2 96%   BMI 23.46 kg/m    Wt Readings from Last 3 Encounters:  05/02/18 64 kg (141 lb)  02/05/18 63.5 kg (140 lb)  01/30/18 64.8 kg (142 lb 12.8 oz)  General: NAD Neck: No JVD, no thyromegaly or thyroid nodule.  Lungs: Clear to auscultation bilaterally with normal respiratory effort. CV: Nondisplaced PMI.  Heart regular S1/S2, no S3/S4, no murmur.  No peripheral edema.  No carotid bruit.  Normal pedal pulses.  Abdomen: Soft, nontender, no hepatosplenomegaly, no distention.  Skin: Intact without lesions or rashes.  Neurologic: Alert and oriented x 3.  Psych: Normal affect. Extremities: No clubbing or cyanosis.  HEENT: Normal.   Assessment/Plan: 1. Chronic systolic CHF: EF 10-62% with diffuse hypokinesis on 12/16 echo.  Nonischemic cardiomyopathy, probably related to Adriamycin with ovarian cancer treatment.  Cardiac MRI in 1/17 showed EF 29% with normal RV and septal-lateral dyssynchrony.  There was a non-cardiac pattern of LGE in the septum, cannot rule out prior myocarditis based on this pattern.  She has a Medtronic CRT-D device.  Echo 4/19 with EF 30-35%.  NYHA class II symptoms currently.  She looks euvolemic on exam and by Optivol. She has not had fall or severe orthostatic symptoms since meds were cut back.  - Continue Toprol XL 25 mg bid.  - Continue spironolactone 25 daily. BMET today.    - Continue Entresto 24/26 bid.     - Continue Lasix 40 qam/20 qpm and KCl 40 daily.  - I will not titrate her BP-active meds given orthostasis with fall this summer.  2. Atrial fibrillation: Paroxysmal. Lower atrial fibrillation burden on device on 200 mg daily amiodarone (1 episode in 12/19, lasted about 24 hrs).   - Check LFTs today with amiodarone use.  Will also need TSH, free T3, free T4 (mildly elevated TSH at last check).  She will need periodic eye exam as well.  - Continue warfarin.    Followup in 4 months  [name] 05/02/2018

## 2018-05-02 NOTE — Patient Instructions (Signed)
Labs were done today. We will call you with any ABNORMAL results. No news is good news!  EKG was done today.  Your physician wants you to follow-up in 4 months. You will receive a reminder letter in the mail two months in advance. If you don't receive a letter, please call our office to schedule the follow-up appointment.

## 2018-05-03 ENCOUNTER — Telehealth (HOSPITAL_COMMUNITY): Payer: Self-pay

## 2018-05-03 LAB — T3, FREE: T3, Free: 1.9 pg/mL — ABNORMAL LOW (ref 2.0–4.4)

## 2018-05-03 NOTE — Telephone Encounter (Signed)
Pt called not available left message for her to call back when she gets in  Stop potassium. She is hypothyroid. Would start Levothyroxine 25 mcg daily and send labs to PCP to followup going forwards. BMET again in 1 week with high K.

## 2018-05-04 ENCOUNTER — Other Ambulatory Visit (HOSPITAL_COMMUNITY): Payer: Self-pay

## 2018-05-04 MED ORDER — LEVOTHYROXINE SODIUM 25 MCG PO TABS: 25.0000 ug | ORAL_TABLET | Freq: Every day | ORAL | 2 refills | Status: DC

## 2018-05-09 ENCOUNTER — Ambulatory Visit (HOSPITAL_COMMUNITY)
Admission: RE | Admit: 2018-05-09 | Discharge: 2018-05-09 | Disposition: A | Discharge status: Home / Self Care | Payer: Medicare Other | Source: Ambulatory Visit | Attending: Cardiology | Admitting: Cardiology

## 2018-05-09 DIAGNOSIS — I5022 Chronic systolic (congestive) heart failure: Secondary | ICD-10-CM | POA: Insufficient documentation

## 2018-05-09 LAB — BASIC METABOLIC PANEL
Anion gap: 10 (ref 5–15)
BUN: 17 mg/dL (ref 8–23)
CO2: 29 mmol/L (ref 22–32)
Calcium: 8.7 mg/dL — ABNORMAL LOW (ref 8.9–10.3)
Chloride: 96 mmol/L — ABNORMAL LOW (ref 98–111)
Creatinine, Ser: 1.15 mg/dL — ABNORMAL HIGH (ref 0.44–1.00)
GFR calc Af Amer: 52 mL/min — ABNORMAL LOW (ref >60)
GFR calc non Af Amer: 45 mL/min — ABNORMAL LOW (ref >60)
Glucose, Bld: 89 mg/dL (ref 70–99)
Potassium: 3.5 mmol/L (ref 3.5–5.1)
Sodium: 135 mmol/L (ref 135–145)

## 2018-06-06 ENCOUNTER — Ambulatory Visit (INDEPENDENT_AMBULATORY_CARE_PROVIDER_SITE_OTHER): Payer: Medicare Other | Admitting: *Deleted

## 2018-06-06 DIAGNOSIS — I480 Paroxysmal atrial fibrillation: Secondary | ICD-10-CM

## 2018-06-06 DIAGNOSIS — Z5181 Encounter for therapeutic drug level monitoring: Secondary | ICD-10-CM | POA: Diagnosis not present

## 2018-06-06 LAB — POCT INR: INR: 2.7 (ref 2.0–3.0)

## 2018-06-06 NOTE — Patient Instructions (Signed)
Description   Continue taking 1 tablet daily except 1/2 tablet on Tuesdays, Thursdays and Sundays. Recheck INR in 6 weeks.  Call us with any new medications # [phone].

## 2018-06-29 DIAGNOSIS — J385 Laryngeal spasm: Secondary | ICD-10-CM | POA: Diagnosis not present

## 2018-07-05 ENCOUNTER — Other Ambulatory Visit (HOSPITAL_COMMUNITY): Payer: Self-pay | Admitting: Cardiology

## 2018-07-14 ENCOUNTER — Other Ambulatory Visit: Payer: Self-pay | Admitting: Cardiology

## 2018-07-17 ENCOUNTER — Telehealth: Payer: Self-pay | Admitting: Internal Medicine

## 2018-07-17 NOTE — Telephone Encounter (Signed)
Pt rescheduled for June

## 2018-07-17 NOTE — Telephone Encounter (Signed)
New message    Patient DOES NOT want to do a virtual visit. She said she will wait until she can be seen in the office. Staff message to [facility] to call and rs patient.

## 2018-07-18 ENCOUNTER — Telehealth: Payer: Self-pay

## 2018-07-18 NOTE — Telephone Encounter (Signed)

## 2018-07-19 ENCOUNTER — Other Ambulatory Visit: Payer: Self-pay

## 2018-07-19 ENCOUNTER — Ambulatory Visit: Payer: Medicare Other | Admitting: Internal Medicine

## 2018-07-19 ENCOUNTER — Ambulatory Visit (INDEPENDENT_AMBULATORY_CARE_PROVIDER_SITE_OTHER): Payer: Medicare Other | Admitting: Pharmacist

## 2018-07-19 DIAGNOSIS — I480 Paroxysmal atrial fibrillation: Secondary | ICD-10-CM

## 2018-07-19 DIAGNOSIS — Z5181 Encounter for therapeutic drug level monitoring: Secondary | ICD-10-CM | POA: Diagnosis not present

## 2018-07-19 LAB — POCT INR: INR: 2.6 (ref 2.0–3.0)

## 2018-07-19 MED ORDER — APIXABAN 5 MG PO TABS: 5.0000 mg | ORAL_TABLET | Freq: Two times a day (BID) | ORAL | 5 refills | Status: DC

## 2018-07-20 ENCOUNTER — Other Ambulatory Visit: Payer: Self-pay | Admitting: Internal Medicine

## 2018-07-27 ENCOUNTER — Other Ambulatory Visit (HOSPITAL_COMMUNITY): Payer: Self-pay | Admitting: Cardiology

## 2018-07-30 ENCOUNTER — Ambulatory Visit (INDEPENDENT_AMBULATORY_CARE_PROVIDER_SITE_OTHER): Payer: Medicare Other | Admitting: *Deleted

## 2018-07-30 ENCOUNTER — Other Ambulatory Visit (HOSPITAL_COMMUNITY): Payer: Self-pay | Admitting: Cardiology

## 2018-07-30 ENCOUNTER — Other Ambulatory Visit: Payer: Self-pay

## 2018-07-30 DIAGNOSIS — I5022 Chronic systolic (congestive) heart failure: Secondary | ICD-10-CM

## 2018-07-30 DIAGNOSIS — I428 Other cardiomyopathies: Secondary | ICD-10-CM

## 2018-07-30 LAB — CUP PACEART REMOTE DEVICE CHECK
Battery Remaining Longevity: 39 mo
Battery Voltage: 2.96 V
Brady Statistic AP VP Percent: 91.44 %
Brady Statistic AP VS Percent: 0.2 %
Brady Statistic AS VP Percent: 6.69 %
Brady Statistic AS VS Percent: 1.67 %
Brady Statistic RA Percent Paced: 91.21 %
Brady Statistic RV Percent Paced: 97.99 %
Date Time Interrogation Session: 20200413063323
HighPow Impedance: 65 Ohm
Implantable Lead Implant Date: 20170328
Implantable Lead Implant Date: 20170328
Implantable Lead Implant Date: 20170328
Implantable Lead Location: 753858
Implantable Lead Location: 753859
Implantable Lead Location: 753860
Implantable Lead Model: 4598
Implantable Lead Model: 5076
Implantable Lead Model: 6935
Implantable Pulse Generator Implant Date: 20170328
Lead Channel Impedance Value: 285 Ohm
Lead Channel Impedance Value: 304 Ohm
Lead Channel Impedance Value: 342 Ohm
Lead Channel Impedance Value: 399 Ohm
Lead Channel Impedance Value: 418 Ohm
Lead Channel Impedance Value: 513 Ohm
Lead Channel Impedance Value: 513 Ohm
Lead Channel Impedance Value: 513 Ohm
Lead Channel Impedance Value: 513 Ohm
Lead Channel Impedance Value: 551 Ohm
Lead Channel Impedance Value: 665 Ohm
Lead Channel Impedance Value: 703 Ohm
Lead Channel Impedance Value: 722 Ohm
Lead Channel Pacing Threshold Amplitude: 0.875 V
Lead Channel Pacing Threshold Amplitude: 0.875 V
Lead Channel Pacing Threshold Amplitude: 1.25 V
Lead Channel Pacing Threshold Pulse Width: 0.4 ms
Lead Channel Pacing Threshold Pulse Width: 0.4 ms
Lead Channel Pacing Threshold Pulse Width: 0.4 ms
Lead Channel Sensing Intrinsic Amplitude: 0.375 mV
Lead Channel Sensing Intrinsic Amplitude: 0.375 mV
Lead Channel Sensing Intrinsic Amplitude: 11 mV
Lead Channel Sensing Intrinsic Amplitude: 11 mV
Lead Channel Setting Pacing Amplitude: 2 V
Lead Channel Setting Pacing Amplitude: 2.5 V
Lead Channel Setting Pacing Amplitude: 2.5 V
Lead Channel Setting Pacing Pulse Width: 0.4 ms
Lead Channel Setting Pacing Pulse Width: 0.4 ms
Lead Channel Setting Sensing Sensitivity: 0.3 mV

## 2018-08-09 NOTE — Progress Notes (Signed)
Remote ICD transmission.   

## 2018-08-28 ENCOUNTER — Ambulatory Visit (HOSPITAL_COMMUNITY)
Admission: RE | Admit: 2018-08-28 | Discharge: 2018-08-28 | Disposition: A | Discharge status: Home / Self Care | Payer: Medicare Other | Source: Ambulatory Visit | Attending: Cardiology | Admitting: Cardiology

## 2018-08-28 ENCOUNTER — Telehealth (HOSPITAL_COMMUNITY): Payer: Self-pay

## 2018-08-28 ENCOUNTER — Other Ambulatory Visit: Payer: Self-pay

## 2018-08-28 ENCOUNTER — Encounter (HOSPITAL_COMMUNITY): Payer: Self-pay

## 2018-08-28 DIAGNOSIS — I5022 Chronic systolic (congestive) heart failure: Secondary | ICD-10-CM

## 2018-08-28 DIAGNOSIS — I480 Paroxysmal atrial fibrillation: Secondary | ICD-10-CM | POA: Diagnosis not present

## 2018-08-28 NOTE — Telephone Encounter (Signed)
Left VM to schedule upcoming appts per AVS:  Lab scheduled 20 May @1030   3 Mon f/u with DM 14 Aug @1040   Will also send AVS via mychart

## 2018-08-28 NOTE — Progress Notes (Signed)
Heart Failure TeleHealth Note  Due to national recommendations of social distancing due to [place] 19, Audio/video telehealth visit is felt to be most appropriate for this patient at this time.  See MyChart message from today for patient consent regarding telehealth for Cynthia Frank.  Date:  08/28/2018   ID:  Cynthia Frank, DOB 1935-12-28, MRN [id number]  Location: Home  Provider location: [facility] Type of Visit: Established patient   PCP:  [name], MD  Cardiologist:  Dr. Aundra Dubin  Chief Complaint: Lightheadedness   History of Present Illness: Cynthia Frank is a 83 y.o. female who presents via audio/video conferencing for a telehealth visit today.    she denies symptoms worrisome for COVID 19.   Patient has a history of chronic systolic CHF/nonischemic cardiomyopathy, paroxysmal atrial fibrillation, and prior ovarian and colon cancers. She has had a cardiomyopathy known for > 15 years now. Cardiac cath at diagnosis showed mild nonobstructive disease.  EF has been persistently low.  Cause has been thought to be Adriamycin; she received this as part of her ovarian cancer treatment. She has had paroxysmal atrial fibrillation and has remained in NSR with amiodarone use.  No recent atrial fibrillation symptoms, typically feels prolonged palpitations thought to be atrial fibrillation 2-3 times/year.   She had had dyspnea with moderate to heavy exertion for 4-5 years prior to initial appt.  However, just before her initial appt, her symptoms had worsened.  She developed shortness of breath walking short distances on flat ground.  Lasix was increased to 40 mg bid and she lost weight and felt better.     Cardiac MRI was done in 1/17, showing EF 29% with normal RV.  Septal-lateral dyssynchrony was present and there was a non-coronary pattern of LGE in the septum.  LFTs were noted to be mildly elevated.  Amiodarone was decreased to 100 mg daily.    She had Medtronic CRT-D placed in 3/17. Repeat echo 8/17 showed EF 35% with diffuse hypokinesis, normal RV.  Echo in 4/19 showed EF 30-35%, mild AI.   On 11/12/17, she stood up, got lightheaded and passed out briefly with a fall.  Her ICD did not discharge.  She went to the ER and was found to have right C5 transverse process fracture.  She is wearing a cervical collar.  She says that she has had lightheadedness with standing for years, comes and goes.  She does not feel like she has been more lightheaded than is her norm, but that one episode on 7/28 led to syncope.  I decreased her Toprol XL.   She has been stable recently.  Still with lightheadedness if she stands up too fast, but no falls and not unsteady on her feet. No significant exertional dyspnea.  No chest pain.  No orthopnea/PND.  No edema.  She was started on Levoxyl after last appointment for hypothyroidism.  Labs (1/17): K 4.2, creatinine 1.14, BNP 855 Labs (3/17): SPEP negative, BNP 341, K 4.5, creatinine 1.27, AST 62, ALT 74, TSH normal with low free T3 and normal free T4, HCT 46.5 Labs (4/17): AST 64, ALT 67, BNP 278, K 3.6, creatinine 1.21 Labs (5/17): K 3.4, creatinine 1.39, AST 53, ALT 61, HBV/HCV negative, TSH mildly elevated, BNP 306 Labs (6/17): K 4.4, creatinine 1.36, free T4 normal, free T3 mildly decreased, AST 50, ALT 53. Labs (7/17): ANA negative, ASMA positive Labs (8/17): AST 45, ALT 37, K 3.8, creatinine 1.19, TSH normal Labs (2/18):  TSH normal, LDL 47, LFTs, normal Labs (3/18): K normal, creatinine 1.23.  Labs (7/18): K 4, creatinine 1.32, LFTs normal Labs (7/19): K 3.8, creatinine 1.25, hgb 11.6 Labs (8/19): TSH mildly elevated, K 4.2, creatinine 1.22 Labs (10/19): K 4.1, creatinine 1.23, LFTs normal, TSH mildly elevated with normal free T3 and free T4.  Labs (1/20): K 3.5, creatinine 1.15, TSH elevated, free T3 and free T4 low, AST 63, ALT 51  PMH: 1. Chronic systolic CHF: Nonischemic cardiomyopathy,  thought to be related to adriamycin that she had for treatment of ovarian cancer.  NICM diagnosed ~ 2001.  - LHC ~ 2001 with 30% stenosis D1.   - Echo (2013) EF 25-30%.  - Echo (10/14) with EF 25-30%. - Echo (12/16) with EF 20-25%, mild LV dilation, restrictive diastolic function, moderate MR, severe LAE. - Cardiac MRI (1/17): Moderately dilated LV with EF 29%. Diffuse hypokinesis looking worse in the septal wall. Septal-lateral dyssynchrony. Normal RV size and systolic function.  Moderate MR with tethered posterior leaflet.  Mid-wall LGE in the basal to mid septum. This is not a coronary disease pattern. Could be suggestive of prior myocarditis. - Medtronic CRT-D placed 3/17.  - Echo (8/17): EF 35%, diffuse hypokinesis, normal RV size and systolic function, moderate MR.  - Echo (4/19): EF 30-35%, mild AI.  2. Atrial fibrillation: Paroxysmal.  3. HTN 4. Ovarian cancer: 1985, s/p surgery. Received Adriamycin-based chemotherapy.  5. Colon cancer: 2007, s/p surgery.  6. PVCs 7. GERD 8. PFTs (8/16) without significant abnormality. 9. Prior syncope 10. Elevated transaminases: Uncertain etiology.   Current Outpatient Medications  Medication Sig Dispense Refill  . amiodarone (PACERONE) 200 MG tablet Take 1 tablet (200 mg total) by mouth daily. 90 tablet 3  . apixaban (ELIQUIS) 5 MG TABS tablet Take 1 tablet (5 mg total) by mouth 2 (two) times daily. 60 tablet 5  . atorvastatin (LIPITOR) 40 MG tablet TAKE 1 TABLET BY MOUTH EVERY DAY 90 tablet 1  . citalopram (CELEXA) 20 MG tablet Take 1 tablet (20 mg total) by mouth daily. 90 tablet 3  . Coenzyme Q10 (COQ10) 100 MG CAPS Take 100 mg by mouth daily.     Marland Kitchen conjugated estrogens (PREMARIN) vaginal cream Place 1 Applicatorful vaginally as needed (dyspareunia (Use as directed)). Reported on 05/11/2015    . diclofenac sodium (VOLTAREN) 1 % GEL APPLY 2 GRAMS TOPICALLY 4 TIMES DAILY 100 g 0  . ENTRESTO 24-26 MG TAKE 1 TABLET BY MOUTH TWICE DAILY 180 tablet  3  . furosemide (LASIX) 20 MG tablet TAKE 2 TABLETS BY MOUTH IN THE MORNING AND 1 TABLET IN THE EVENING 100 tablet 3  . ibuprofen (ADVIL,MOTRIN) 200 MG tablet Take 200 mg by mouth daily as needed for headache or moderate pain.    Marland Kitchen ipratropium (ATROVENT) 0.03 % nasal spray Place 2 sprays into both nostrils 2 (two) times daily.  11  . levothyroxine (SYNTHROID, LEVOTHROID) 25 MCG tablet TAKE 1 TABLET(25 MCG) BY MOUTH DAILY BEFORE BREAKFAST 30 tablet 1  . Magnesium Oxide 500 MG CAPS Take 2,000 mg by mouth daily.    . metoprolol succinate (TOPROL-XL) 25 MG 24 hr tablet Take 1 tablet (25 mg total) by mouth 2 (two) times daily. 60 tablet 11  . potassium chloride SA (K-DUR,KLOR-CON) 20 MEQ tablet TAKE 2 TABLETS BY MOUTH DAILY 60 tablet 5  . spironolactone (ALDACTONE) 25 MG tablet Take 1 tablet (25 mg total) by mouth every evening. 90 tablet 1  . tretinoin (RETIN-A) 0.1 %  cream Apply 1 application topically every morning.     . zolpidem (AMBIEN) 5 MG tablet TAKE 1 TABLET BY MOUTH EVERY DAY AT BEDTIME AS NEEDED 30 tablet 0   No current facility-administered medications for this encounter.     Allergies:   Clindamycin/lincomycin; Pneumococcal vaccines; and Sulfonamide derivatives   Social History:  The patient  reports that she quit smoking about 40 years ago. Her smoking use included cigarettes. She has a 10.00 pack-year smoking history. She has never used smokeless tobacco. She reports current alcohol use of about 14.0 standard drinks of alcohol per week. She reports that she does not use drugs.   Family History:  The patient's family history includes Heart attack in her father; Heart disease in her father; Prostate cancer in her father; Uterine cancer in her mother.   ROS:  Please see the history of present illness.   All other systems are personally reviewed and negative.   Exam:  (Video/Tele Health Call; Exam is subjective and or/visual.) General:  Speaks in full sentences. No resp difficulty.  Neck: No JVD Lungs: Normal respiratory effort with conversation.  Abdomen: Non-distended per patient report Extremities: Pt denies edema. Neuro: Alert & oriented x 3.   Recent Labs: 11/14/2017: Hemoglobin 11.6; Platelets 141 05/02/2018: ALT 51; TSH 6.139 05/09/2018: BUN 17; Creatinine, Ser 1.15; Potassium 3.5; Sodium 135  Personally reviewed   Wt Readings from Last 3 Encounters:  05/02/18 64 kg (141 lb)  02/05/18 63.5 kg (140 lb)  01/30/18 64.8 kg (142 lb 12.8 oz)      ASSESSMENT AND PLAN:  1. Chronic systolic CHF: EF 38-75% with diffuse hypokinesis on 12/16 echo.  Nonischemic cardiomyopathy, probably related to Adriamycin with ovarian cancer treatment.  Cardiac MRI in 1/17 showed EF 29% with normal RV and septal-lateral dyssynchrony.  There was a non-cardiac pattern of LGE in the septum, cannot rule out prior myocarditis based on this pattern.  She has a Medtronic CRT-D device.  Echo 4/19 with EF 30-35%.  NYHA class II symptoms currently.  She looks euvolemic on exam. She has not had fall or severe orthostatic symptoms since meds were cut back but I do not think that we will be able to up-titrate her meds.  - Continue Toprol XL 25 mg bid.  - Continue spironolactone 25 daily. I will arrange for BMET.  - Continue Entresto 24/26 bid.     - Continue Lasix 40 qam/20 qpm, off KCl with prior elevated K.  2. Atrial fibrillation: Paroxysmal. Lower atrial fibrillation burden on device on 200 mg daily amiodarone.  LFTS were mildly elevated in 1/20.  - She needs repeat LFTs, will arrange.  - She will need periodic eye exam as well.  - Continue warfarin.  3. Hypothyroidism: Suspect related to amiodarone use.   - Continue Levoxyl.  - Need to check TSH with next labs.   COVID screen The patient does not have any symptoms that suggest any further testing/ screening at this time.  Social distancing reinforced today.  Patient Risk: After full review of this patients clinical status, I feel that  they are at moderate risk for cardiac decompensation at this time.  Relevant cardiac medications were reviewed at length with the patient today. The patient does not have concerns regarding their medications at this time.   Recommended follow-up:  3 months  Today, I have spent 18 minutes with the patient with telehealth technology discussing the above issues .    Signed, [name], MD  08/28/2018 4:59  PM  [place] and [place] [address] [phone] (office) [phone] (fax)

## 2018-08-31 ENCOUNTER — Encounter (HOSPITAL_COMMUNITY): Payer: Medicare Other | Admitting: Cardiology

## 2018-09-04 ENCOUNTER — Telehealth (HOSPITAL_COMMUNITY): Payer: Self-pay

## 2018-09-04 NOTE — Telephone Encounter (Signed)

## 2018-09-05 ENCOUNTER — Other Ambulatory Visit: Payer: Self-pay

## 2018-09-05 ENCOUNTER — Ambulatory Visit (HOSPITAL_COMMUNITY)
Admission: RE | Admit: 2018-09-05 | Discharge: 2018-09-05 | Disposition: A | Discharge status: Home / Self Care | Payer: Medicare Other | Source: Ambulatory Visit | Attending: Internal Medicine | Admitting: Internal Medicine

## 2018-09-05 DIAGNOSIS — I5022 Chronic systolic (congestive) heart failure: Secondary | ICD-10-CM | POA: Diagnosis not present

## 2018-09-05 LAB — CBC
HCT: 40.2 % (ref 36.0–46.0)
Hemoglobin: 13.2 g/dL (ref 12.0–15.0)
MCH: 31.4 pg (ref 26.0–34.0)
MCHC: 32.8 g/dL (ref 30.0–36.0)
MCV: 95.7 fL (ref 80.0–100.0)
Platelets: 143 10*3/uL — ABNORMAL LOW (ref 150–400)
RBC: 4.2 MIL/uL (ref 3.87–5.11)
RDW: 13.5 % (ref 11.5–15.5)
WBC: 6.9 10*3/uL (ref 4.0–10.5)
nRBC: 0 % (ref 0.0–0.2)

## 2018-09-05 LAB — COMPREHENSIVE METABOLIC PANEL
ALT: 43 U/L (ref 0–44)
AST: 49 U/L — ABNORMAL HIGH (ref 15–41)
Albumin: 3.8 g/dL (ref 3.5–5.0)
Alkaline Phosphatase: 62 U/L (ref 38–126)
Anion gap: 11 (ref 5–15)
BUN: 21 mg/dL (ref 8–23)
CO2: 25 mmol/L (ref 22–32)
Calcium: 8.8 mg/dL — ABNORMAL LOW (ref 8.9–10.3)
Chloride: 101 mmol/L (ref 98–111)
Creatinine, Ser: 1.22 mg/dL — ABNORMAL HIGH (ref 0.44–1.00)
GFR calc Af Amer: 48 mL/min — ABNORMAL LOW (ref >60)
GFR calc non Af Amer: 42 mL/min — ABNORMAL LOW (ref >60)
Glucose, Bld: 111 mg/dL — ABNORMAL HIGH (ref 70–99)
Potassium: 3.8 mmol/L (ref 3.5–5.1)
Sodium: 137 mmol/L (ref 135–145)
Total Bilirubin: 0.9 mg/dL (ref 0.3–1.2)
Total Protein: 6.8 g/dL (ref 6.5–8.1)

## 2018-09-05 LAB — TSH: TSH: 3.094 u[IU]/mL (ref 0.350–4.500)

## 2018-09-14 ENCOUNTER — Other Ambulatory Visit: Payer: Self-pay | Admitting: Internal Medicine

## 2018-09-20 ENCOUNTER — Encounter: Payer: Medicare Other | Admitting: Internal Medicine

## 2018-09-22 ENCOUNTER — Other Ambulatory Visit (HOSPITAL_COMMUNITY): Payer: Self-pay | Admitting: Cardiology

## 2018-09-26 ENCOUNTER — Other Ambulatory Visit (HOSPITAL_COMMUNITY): Payer: Self-pay | Admitting: Cardiology

## 2018-09-30 ENCOUNTER — Other Ambulatory Visit (HOSPITAL_COMMUNITY): Payer: Self-pay | Admitting: Cardiology

## 2018-10-13 ENCOUNTER — Other Ambulatory Visit: Payer: Self-pay | Admitting: Internal Medicine

## 2018-10-23 ENCOUNTER — Other Ambulatory Visit (HOSPITAL_COMMUNITY): Payer: Self-pay | Admitting: Cardiology

## 2018-10-29 ENCOUNTER — Other Ambulatory Visit (HOSPITAL_COMMUNITY): Payer: Self-pay | Admitting: Cardiology

## 2018-10-29 ENCOUNTER — Ambulatory Visit (INDEPENDENT_AMBULATORY_CARE_PROVIDER_SITE_OTHER): Payer: Medicare Other | Admitting: *Deleted

## 2018-10-29 DIAGNOSIS — I5022 Chronic systolic (congestive) heart failure: Secondary | ICD-10-CM | POA: Diagnosis not present

## 2018-10-29 DIAGNOSIS — I428 Other cardiomyopathies: Secondary | ICD-10-CM

## 2018-10-29 LAB — CUP PACEART REMOTE DEVICE CHECK
Battery Remaining Longevity: 35 mo
Battery Voltage: 2.96 V
Brady Statistic AP VP Percent: 95.54 %
Brady Statistic AP VS Percent: 0.16 %
Brady Statistic AS VP Percent: 3.04 %
Brady Statistic AS VS Percent: 1.27 %
Brady Statistic RA Percent Paced: 95.22 %
Brady Statistic RV Percent Paced: 98.53 %
Date Time Interrogation Session: 20200713041603
HighPow Impedance: 73 Ohm
Implantable Lead Implant Date: 20170328
Implantable Lead Implant Date: 20170328
Implantable Lead Implant Date: 20170328
Implantable Lead Location: 753858
Implantable Lead Location: 753859
Implantable Lead Location: 753860
Implantable Lead Model: 4598
Implantable Lead Model: 5076
Implantable Lead Model: 6935
Implantable Pulse Generator Implant Date: 20170328
Lead Channel Impedance Value: 285 Ohm
Lead Channel Impedance Value: 304 Ohm
Lead Channel Impedance Value: 342 Ohm
Lead Channel Impedance Value: 399 Ohm
Lead Channel Impedance Value: 418 Ohm
Lead Channel Impedance Value: 475 Ohm
Lead Channel Impedance Value: 513 Ohm
Lead Channel Impedance Value: 513 Ohm
Lead Channel Impedance Value: 532 Ohm
Lead Channel Impedance Value: 551 Ohm
Lead Channel Impedance Value: 703 Ohm
Lead Channel Impedance Value: 722 Ohm
Lead Channel Impedance Value: 722 Ohm
Lead Channel Pacing Threshold Amplitude: 0.625 V
Lead Channel Pacing Threshold Amplitude: 0.875 V
Lead Channel Pacing Threshold Amplitude: 0.875 V
Lead Channel Pacing Threshold Pulse Width: 0.4 ms
Lead Channel Pacing Threshold Pulse Width: 0.4 ms
Lead Channel Pacing Threshold Pulse Width: 0.4 ms
Lead Channel Sensing Intrinsic Amplitude: 0.375 mV
Lead Channel Sensing Intrinsic Amplitude: 0.375 mV
Lead Channel Sensing Intrinsic Amplitude: 13.375 mV
Lead Channel Sensing Intrinsic Amplitude: 13.375 mV
Lead Channel Setting Pacing Amplitude: 2 V
Lead Channel Setting Pacing Amplitude: 2.25 V
Lead Channel Setting Pacing Amplitude: 2.5 V
Lead Channel Setting Pacing Pulse Width: 0.4 ms
Lead Channel Setting Pacing Pulse Width: 0.4 ms
Lead Channel Setting Sensing Sensitivity: 0.3 mV

## 2018-11-05 ENCOUNTER — Encounter: Payer: Self-pay | Admitting: Internal Medicine

## 2018-11-05 DIAGNOSIS — Z1231 Encounter for screening mammogram for malignant neoplasm of breast: Secondary | ICD-10-CM | POA: Diagnosis not present

## 2018-11-05 DIAGNOSIS — Z803 Family history of malignant neoplasm of breast: Secondary | ICD-10-CM | POA: Diagnosis not present

## 2018-11-09 NOTE — Progress Notes (Signed)
Remote ICD transmission.   

## 2018-11-30 ENCOUNTER — Encounter (HOSPITAL_COMMUNITY): Payer: Self-pay | Admitting: Cardiology

## 2018-11-30 ENCOUNTER — Other Ambulatory Visit: Payer: Self-pay

## 2018-11-30 ENCOUNTER — Ambulatory Visit (HOSPITAL_COMMUNITY)
Admission: RE | Admit: 2018-11-30 | Discharge: 2018-11-30 | Disposition: A | Discharge status: Home / Self Care | Payer: Medicare Other | Source: Ambulatory Visit | Attending: Cardiology | Admitting: Cardiology

## 2018-11-30 VITALS — BP 110/80 | HR 76 | Wt 142.4 lb

## 2018-11-30 DIAGNOSIS — I110 Hypertensive heart disease with heart failure: Secondary | ICD-10-CM | POA: Diagnosis not present

## 2018-11-30 DIAGNOSIS — Z881 Allergy status to other antibiotic agents status: Secondary | ICD-10-CM | POA: Insufficient documentation

## 2018-11-30 DIAGNOSIS — Z79899 Other long term (current) drug therapy: Secondary | ICD-10-CM | POA: Diagnosis not present

## 2018-11-30 DIAGNOSIS — Z882 Allergy status to sulfonamides status: Secondary | ICD-10-CM | POA: Insufficient documentation

## 2018-11-30 DIAGNOSIS — Z8059 Family history of malignant neoplasm of other urinary tract organ: Secondary | ICD-10-CM | POA: Insufficient documentation

## 2018-11-30 DIAGNOSIS — Z791 Long term (current) use of non-steroidal anti-inflammatories (NSAID): Secondary | ICD-10-CM | POA: Insufficient documentation

## 2018-11-30 DIAGNOSIS — Z8249 Family history of ischemic heart disease and other diseases of the circulatory system: Secondary | ICD-10-CM | POA: Diagnosis not present

## 2018-11-30 DIAGNOSIS — Z79890 Hormone replacement therapy (postmenopausal): Secondary | ICD-10-CM | POA: Diagnosis not present

## 2018-11-30 DIAGNOSIS — Z7901 Long term (current) use of anticoagulants: Secondary | ICD-10-CM | POA: Diagnosis not present

## 2018-11-30 DIAGNOSIS — I428 Other cardiomyopathies: Secondary | ICD-10-CM | POA: Insufficient documentation

## 2018-11-30 DIAGNOSIS — I480 Paroxysmal atrial fibrillation: Secondary | ICD-10-CM | POA: Diagnosis not present

## 2018-11-30 DIAGNOSIS — Z85038 Personal history of other malignant neoplasm of large intestine: Secondary | ICD-10-CM | POA: Insufficient documentation

## 2018-11-30 DIAGNOSIS — E039 Hypothyroidism, unspecified: Secondary | ICD-10-CM | POA: Diagnosis not present

## 2018-11-30 DIAGNOSIS — Z8042 Family history of malignant neoplasm of prostate: Secondary | ICD-10-CM | POA: Diagnosis not present

## 2018-11-30 DIAGNOSIS — Z87891 Personal history of nicotine dependence: Secondary | ICD-10-CM | POA: Insufficient documentation

## 2018-11-30 DIAGNOSIS — I5022 Chronic systolic (congestive) heart failure: Secondary | ICD-10-CM | POA: Insufficient documentation

## 2018-11-30 DIAGNOSIS — Z887 Allergy status to serum and vaccine status: Secondary | ICD-10-CM | POA: Diagnosis not present

## 2018-11-30 DIAGNOSIS — Z8543 Personal history of malignant neoplasm of ovary: Secondary | ICD-10-CM | POA: Diagnosis not present

## 2018-11-30 LAB — CBC
HCT: 40.4 % (ref 36.0–46.0)
Hemoglobin: 13.2 g/dL (ref 12.0–15.0)
MCH: 31.6 pg (ref 26.0–34.0)
MCHC: 32.7 g/dL (ref 30.0–36.0)
MCV: 96.7 fL (ref 80.0–100.0)
Platelets: 137 10*3/uL — ABNORMAL LOW (ref 150–400)
RBC: 4.18 MIL/uL (ref 3.87–5.11)
RDW: 13.1 % (ref 11.5–15.5)
WBC: 5.8 10*3/uL (ref 4.0–10.5)
nRBC: 0 % (ref 0.0–0.2)

## 2018-11-30 LAB — COMPREHENSIVE METABOLIC PANEL
ALT: 50 U/L — ABNORMAL HIGH (ref 0–44)
AST: 49 U/L — ABNORMAL HIGH (ref 15–41)
Albumin: 4 g/dL (ref 3.5–5.0)
Alkaline Phosphatase: 57 U/L (ref 38–126)
Anion gap: 12 (ref 5–15)
BUN: 20 mg/dL (ref 8–23)
CO2: 24 mmol/L (ref 22–32)
Calcium: 9 mg/dL (ref 8.9–10.3)
Chloride: 100 mmol/L (ref 98–111)
Creatinine, Ser: 1.5 mg/dL — ABNORMAL HIGH (ref 0.44–1.00)
GFR calc Af Amer: 38 mL/min — ABNORMAL LOW (ref >60)
GFR calc non Af Amer: 33 mL/min — ABNORMAL LOW (ref >60)
Glucose, Bld: 106 mg/dL — ABNORMAL HIGH (ref 70–99)
Potassium: 3.7 mmol/L (ref 3.5–5.1)
Sodium: 136 mmol/L (ref 135–145)
Total Bilirubin: 0.9 mg/dL (ref 0.3–1.2)
Total Protein: 7 g/dL (ref 6.5–8.1)

## 2018-11-30 LAB — TSH: TSH: 2.465 u[IU]/mL (ref 0.350–4.500)

## 2018-11-30 NOTE — Patient Instructions (Signed)
No medication changes today!!  Labs today We will only contact you if something comes back abnormal or we need to make some changes. Otherwise no news is good news!  Your physician recommends that you schedule a follow-up appointment in: 3 months for an ECHO and visit with Dr Aundra Dubin  At the [facility], you and your health needs are our priority. As part of our continuing mission to provide you with exceptional heart care, we have created designated Provider Care Teams. These Care Teams include your primary Cardiologist (physician) and Advanced Practice Providers (APPs- Physician Assistants and Nurse Practitioners) who all work together to provide you with the care you need, when you need it.   You may see any of the following providers on your designated Care Team at your next follow up: Marland Kitchen Dr Glori Bickers . Dr [name] . Darrick Grinder, NP   Please be sure to bring in all your medications bottles to every appointment.

## 2018-12-02 NOTE — Progress Notes (Signed)
Date:  12/02/2018   ID:  Cynthia Frank, DOB [birth year], MRN [id number]  Provider location: [facility] Type of Visit: Established patient   PCP:  [name], MD  Cardiologist:  Dr. Aundra Dubin  Chief Complaint: Lightheadedness   History of Present Illness: Cynthia Frank is a 83 y.o. female who has a history of chronic systolic CHF/nonischemic cardiomyopathy, paroxysmal atrial fibrillation, and prior ovarian and colon cancers. She has had a cardiomyopathy known for > 15 years now. Cardiac cath at diagnosis showed mild nonobstructive disease.  EF has been persistently low.  Cause has been thought to be Adriamycin; she received this as part of her ovarian cancer treatment. She has had paroxysmal atrial fibrillation and has remained in NSR with amiodarone use.  No recent atrial fibrillation symptoms, typically feels prolonged palpitations thought to be atrial fibrillation 2-3 times/year.   She had had dyspnea with moderate to heavy exertion for 4-5 years prior to initial appt.  However, just before her initial appt, her symptoms had worsened.  She developed shortness of breath walking short distances on flat ground.  Lasix was increased to 40 mg bid and she lost weight and felt better.     Cardiac MRI was done in 1/17, showing EF 29% with normal RV.  Septal-lateral dyssynchrony was present and there was a non-coronary pattern of LGE in the septum.  LFTs were noted to be mildly elevated.  Amiodarone was decreased to 100 mg daily.   She had Medtronic CRT-D placed in 3/17. Repeat echo 8/17 showed EF 35% with diffuse hypokinesis, normal RV.  Echo in 4/19 showed EF 30-35%, mild AI.   On 11/12/17, she stood up, got lightheaded and passed out briefly with a fall.  Her ICD did not discharge.  She went to the ER and was found to have right C5 transverse process fracture.  She is wearing a cervical collar.  She says that she has had lightheadedness with standing  for years, comes and goes.  She does not feel like she has been more lightheaded than is her norm, but that one episode on 11/12/17 led to syncope.  I decreased her Toprol XL.   She returns today for followup of CHF.  She has been stable clinically, no significant exertional dyspnea.  No orthopnea/PND. No chest pain.  No BRBPR/melena.  She still occasionally is lightheaded when standing, but no falls or syncope.    Medtronic device interrogation: Occasional short AF runs, she loses BiV pacing when she is in atrial fibrillation.  Thoracic impedance stable.   Labs (1/17): K 4.2, creatinine 1.14, BNP 855 Labs (3/17): SPEP negative, BNP 341, K 4.5, creatinine 1.27, AST 62, ALT 74, TSH normal with low free T3 and normal free T4, HCT 46.5 Labs (4/17): AST 64, ALT 67, BNP 278, K 3.6, creatinine 1.21 Labs (5/17): K 3.4, creatinine 1.39, AST 53, ALT 61, HBV/HCV negative, TSH mildly elevated, BNP 306 Labs (6/17): K 4.4, creatinine 1.36, free T4 normal, free T3 mildly decreased, AST 50, ALT 53. Labs (7/17): ANA negative, ASMA positive Labs (8/17): AST 45, ALT 37, K 3.8, creatinine 1.19, TSH normal Labs (2/18): TSH normal, LDL 47, LFTs, normal Labs (3/18): K normal, creatinine 1.23.  Labs (7/18): K 4, creatinine 1.32, LFTs normal Labs (7/19): K 3.8, creatinine 1.25, hgb 11.6 Labs (8/19): TSH mildly elevated, K 4.2, creatinine 1.22 Labs (10/19): K 4.1, creatinine 1.23, LFTs normal, TSH mildly elevated with normal free T3 and free T4.  Labs (1/20): K 3.5, creatinine 1.15, TSH elevated, free T3 and free T4 low, AST 63, ALT 51 Labs (5/20): K 3.8, creatinine 1.22, TSH normal, AST 49, ALT 43, tbili 0.9  PMH: 1. Chronic systolic CHF: Nonischemic cardiomyopathy, thought to be related to adriamycin that she had for treatment of ovarian cancer.  NICM diagnosed ~ 2001.  - LHC ~ 2001 with 30% stenosis D1.   - Echo (2013) EF 25-30%.  - Echo (10/14) with EF 25-30%. - Echo (12/16) with EF 20-25%, mild LV dilation,  restrictive diastolic function, moderate MR, severe LAE. - Cardiac MRI (1/17): Moderately dilated LV with EF 29%. Diffuse hypokinesis looking worse in the septal wall. Septal-lateral dyssynchrony. Normal RV size and systolic function.  Moderate MR with tethered posterior leaflet.  Mid-wall LGE in the basal to mid septum. This is not a coronary disease pattern. Could be suggestive of prior myocarditis. - Medtronic CRT-D placed 3/17.  - Echo (8/17): EF 35%, diffuse hypokinesis, normal RV size and systolic function, moderate MR.  - Echo (4/19): EF 30-35%, mild AI.  2. Atrial fibrillation: Paroxysmal.  3. HTN 4. Ovarian cancer: 1985, s/p surgery. Received Adriamycin-based chemotherapy.  5. Colon cancer: 2007, s/p surgery.  6. PVCs 7. GERD 8. PFTs (8/16) without significant abnormality. 9. Prior syncope 10. Elevated transaminases: Uncertain etiology.   Current Outpatient Medications  Medication Sig Dispense Refill   amiodarone (PACERONE) 200 MG tablet TAKE 1 TABLET BY MOUTH DAILY 90 tablet 3   apixaban (ELIQUIS) 5 MG TABS tablet Take 1 tablet (5 mg total) by mouth 2 (two) times daily. 60 tablet 5   atorvastatin (LIPITOR) 40 MG tablet TAKE 1 TABLET BY MOUTH EVERY DAY 90 tablet 1   citalopram (CELEXA) 20 MG tablet TAKE 1 TABLET(20 MG) BY MOUTH DAILY 90 tablet 0   Coenzyme Q10 (COQ10) 100 MG CAPS Take 100 mg by mouth daily.      conjugated estrogens (PREMARIN) vaginal cream Place 1 Applicatorful vaginally as needed (dyspareunia (Use as directed)). Reported on 05/11/2015     diclofenac sodium (VOLTAREN) 1 % GEL APPLY 2 GRAMS TOPICALLY 4 TIMES DAILY 100 g 0   ENTRESTO 24-26 MG TAKE 1 TABLET BY MOUTH TWICE DAILY 180 tablet 3   furosemide (LASIX) 20 MG tablet TAKE 2 TABLETS BY MOUTH IN THE MORNING AND 1 TABLET IN THE EVENING 270 tablet 1   ibuprofen (ADVIL,MOTRIN) 200 MG tablet Take 200 mg by mouth daily as needed for headache or moderate pain.     ipratropium (ATROVENT) 0.03 % nasal spray  Place 2 sprays into both nostrils 2 (two) times daily.  11   levothyroxine (SYNTHROID) 25 MCG tablet TAKE 1 TABLET(25 MCG) BY MOUTH DAILY BEFORE BREAKFAST 90 tablet 3   Magnesium Oxide 500 MG CAPS Take 2,000 mg by mouth daily.     metoprolol succinate (TOPROL-XL) 25 MG 24 hr tablet Take 1 tablet (25 mg total) by mouth 2 (two) times daily. 60 tablet 11   spironolactone (ALDACTONE) 25 MG tablet TAKE 1 TABLET(25 MG) BY MOUTH EVERY EVENING 90 tablet 1   tretinoin (RETIN-A) 0.1 % cream Apply 1 application topically every morning.      zolpidem (AMBIEN) 5 MG tablet TAKE 1 TABLET BY MOUTH EVERY DAY AT BEDTIME AS NEEDED 30 tablet 0   No current facility-administered medications for this encounter.     Allergies:   Clindamycin/lincomycin, Pneumococcal vaccines, and Sulfonamide derivatives   Social History:  The patient  reports that she quit smoking about 40 years  ago. Her smoking use included cigarettes. She has a 10.00 pack-year smoking history. She has never used smokeless tobacco. She reports current alcohol use of about 14.0 standard drinks of alcohol per week. She reports that she does not use drugs.   Family History:  The patient's family history includes Heart attack in her father; Heart disease in her father; Prostate cancer in her father; Uterine cancer in her mother.   ROS:  Please see the history of present illness.   All other systems are personally reviewed and negative.   Exam:  BP 110/80    Pulse 76    Wt 64.6 kg (142 lb 6.4 oz)    SpO2 97%    BMI 23.70 kg/m  General: NAD Neck: No JVD, no thyromegaly or thyroid nodule.  Lungs: Clear to auscultation bilaterally with normal respiratory effort. CV: Nondisplaced PMI.  Heart regular S1/S2, no S3/S4, no murmur.  No peripheral edema.   Abdomen: Soft, nontender, no hepatosplenomegaly, no distention.  Skin: Intact without lesions or rashes.  Neurologic: Alert and oriented x 3.  Psych: Normal affect. Extremities: No clubbing or  cyanosis.  HEENT: Normal.   Recent Labs: 11/30/2018: ALT 50; BUN 20; Creatinine, Ser 1.50; Hemoglobin 13.2; Platelets 137; Potassium 3.7; Sodium 136; TSH 2.465  Personally reviewed   Wt Readings from Last 3 Encounters:  11/30/18 64.6 kg (142 lb 6.4 oz)  05/02/18 64 kg (141 lb)  02/05/18 63.5 kg (140 lb)    ASSESSMENT AND PLAN:  1. Chronic systolic CHF: EF 91-63% with diffuse hypokinesis on 12/16 echo.  Nonischemic cardiomyopathy, probably related to Adriamycin with ovarian cancer treatment.  Cardiac MRI in 1/17 showed EF 29% with normal RV and septal-lateral dyssynchrony.  There was a non-cardiac pattern of LGE in the septum, cannot rule out prior myocarditis based on this pattern.  She has a Medtronic CRT-D device.  Echo 4/19 with EF 30-35%.  NYHA class II symptoms currently.  She looks euvolemic on exam and by Optivol. She has not had fall or severe orthostatic symptoms since meds were cut back but I do not think that we will be able to up-titrate her meds.  - Continue Toprol XL 25 mg bid.  - Continue spironolactone 25 daily. BMET today.   - Continue Entresto 24/26 bid.     - Continue Lasix 40 qam/20 qpm.  - Arrange for echo at followup in 3 months.  2. Atrial fibrillation: Paroxysmal. Lower atrial fibrillation burden on device on 200 mg daily amiodarone.  LFTs have continued to be very mildly elevated. I think that she needs to continue amiodarone as she loses BiV pacing when she goes into atrial fibrillation.  - Check LFTs and TSH today. - She will need periodic eye exam as well.  - Continue warfarin.  3. Hypothyroidism: Suspect related to amiodarone use.   - Continue Levoxyl.  - TSH today as above.    Recommended follow-up:  3 months  Signed, [name], MD  12/02/2018  [place] [address] [facility] [place] [address] [phone] (office) [phone] (fax)

## 2018-12-05 ENCOUNTER — Telehealth (HOSPITAL_COMMUNITY): Payer: Self-pay

## 2018-12-05 DIAGNOSIS — I5022 Chronic systolic (congestive) heart failure: Secondary | ICD-10-CM

## 2018-12-05 DIAGNOSIS — R7989 Other specified abnormal findings of blood chemistry: Secondary | ICD-10-CM

## 2018-12-05 NOTE — Telephone Encounter (Signed)
Pt made aware of results and need for ultrasound.  Pt verbalized understanding and amenable to plan. Ultrasound ordered to assess liver.

## 2018-12-05 NOTE — Addendum Note (Signed)
Addended by: Valeda Malm on: 12/05/2018 11:39 AM   Modules accepted: Orders

## 2018-12-05 NOTE — Telephone Encounter (Signed)
-----   Message from Larey Dresser, MD sent at 11/30/2018  3:49 PM EDT ----- Still very mild LFT elevation.  Please have her get a RUQ [place] to assess liver.  May be due to amiodarone but does not seem progressive.

## 2018-12-07 ENCOUNTER — Telehealth: Payer: Self-pay

## 2018-12-07 NOTE — Telephone Encounter (Signed)
Spoke with pt regarding appt on 12/10/18. Pt stated he has not been in contact with anyone who may have covid-19 and has no symptoms. 

## 2018-12-10 ENCOUNTER — Encounter: Payer: Self-pay | Admitting: Internal Medicine

## 2018-12-10 ENCOUNTER — Ambulatory Visit (INDEPENDENT_AMBULATORY_CARE_PROVIDER_SITE_OTHER): Payer: Medicare Other | Admitting: Internal Medicine

## 2018-12-10 ENCOUNTER — Other Ambulatory Visit: Payer: Self-pay

## 2018-12-10 VITALS — BP 102/68 | HR 88 | Ht 65.0 in | Wt 141.0 lb

## 2018-12-10 DIAGNOSIS — I5022 Chronic systolic (congestive) heart failure: Secondary | ICD-10-CM

## 2018-12-10 DIAGNOSIS — I480 Paroxysmal atrial fibrillation: Secondary | ICD-10-CM

## 2018-12-10 DIAGNOSIS — I428 Other cardiomyopathies: Secondary | ICD-10-CM | POA: Diagnosis not present

## 2018-12-10 LAB — CUP PACEART INCLINIC DEVICE CHECK
Battery Remaining Longevity: 34 mo
Battery Voltage: 2.94 V
Brady Statistic AP VP Percent: 92.33 %
Brady Statistic AP VS Percent: 0.37 %
Brady Statistic AS VP Percent: 5.85 %
Brady Statistic AS VS Percent: 1.45 %
Brady Statistic RA Percent Paced: 92.27 %
Brady Statistic RV Percent Paced: 96.99 %
Date Time Interrogation Session: 20200824173637
HighPow Impedance: 64 Ohm
Implantable Lead Implant Date: 20170328
Implantable Lead Implant Date: 20170328
Implantable Lead Implant Date: 20170328
Implantable Lead Location: 753858
Implantable Lead Location: 753859
Implantable Lead Location: 753860
Implantable Lead Model: 4598
Implantable Lead Model: 5076
Implantable Lead Model: 6935
Implantable Pulse Generator Implant Date: 20170328
Lead Channel Impedance Value: 304 Ohm
Lead Channel Impedance Value: 342 Ohm
Lead Channel Impedance Value: 361 Ohm
Lead Channel Impedance Value: 418 Ohm
Lead Channel Impedance Value: 418 Ohm
Lead Channel Impedance Value: 513 Ohm
Lead Channel Impedance Value: 532 Ohm
Lead Channel Impedance Value: 532 Ohm
Lead Channel Impedance Value: 589 Ohm
Lead Channel Impedance Value: 589 Ohm
Lead Channel Impedance Value: 874 Ohm
Lead Channel Impedance Value: 874 Ohm
Lead Channel Impedance Value: 874 Ohm
Lead Channel Pacing Threshold Amplitude: 0.75 V
Lead Channel Pacing Threshold Amplitude: 0.75 V
Lead Channel Pacing Threshold Amplitude: 0.75 V
Lead Channel Pacing Threshold Pulse Width: 0.4 ms
Lead Channel Pacing Threshold Pulse Width: 0.4 ms
Lead Channel Pacing Threshold Pulse Width: 0.4 ms
Lead Channel Sensing Intrinsic Amplitude: 0.5 mV
Lead Channel Sensing Intrinsic Amplitude: 12.25 mV
Lead Channel Setting Pacing Amplitude: 2 V
Lead Channel Setting Pacing Amplitude: 2.25 V
Lead Channel Setting Pacing Amplitude: 2.5 V
Lead Channel Setting Pacing Pulse Width: 0.4 ms
Lead Channel Setting Pacing Pulse Width: 0.4 ms
Lead Channel Setting Sensing Sensitivity: 0.3 mV

## 2018-12-10 NOTE — Progress Notes (Signed)
HPI Cynthia Frank returns today for followup. She is a pleasant 83 yo woman with CHB, LBBB, s/p biv ICD insertion in the setting of  Adriamycin induced LV dysfunction. She has not had chest pain and her CHF symptoms appear to be class 2. She is now 5 years out from her ICD insertion. No ICD shocks.  Allergies  Allergen Reactions  . Clindamycin/Lincomycin     itching  . Pneumococcal Vaccines Itching and Swelling    Redness   . Sulfonamide Derivatives Itching and Swelling     Current Outpatient Medications  Medication Sig Dispense Refill  . amiodarone (PACERONE) 200 MG tablet TAKE 1 TABLET BY MOUTH DAILY 90 tablet 3  . apixaban (ELIQUIS) 5 MG TABS tablet Take 1 tablet (5 mg total) by mouth 2 (two) times daily. 60 tablet 5  . atorvastatin (LIPITOR) 40 MG tablet TAKE 1 TABLET BY MOUTH EVERY DAY 90 tablet 1  . citalopram (CELEXA) 20 MG tablet TAKE 1 TABLET(20 MG) BY MOUTH DAILY 90 tablet 0  . Coenzyme Q10 (COQ10) 100 MG CAPS Take 100 mg by mouth daily.     Marland Kitchen conjugated estrogens (PREMARIN) vaginal cream Place 1 Applicatorful vaginally as needed (dyspareunia (Use as directed)). Reported on 05/11/2015    . diclofenac sodium (VOLTAREN) 1 % GEL APPLY 2 GRAMS TOPICALLY 4 TIMES DAILY 100 g 0  . ENTRESTO 24-26 MG TAKE 1 TABLET BY MOUTH TWICE DAILY 180 tablet 3  . furosemide (LASIX) 20 MG tablet TAKE 2 TABLETS BY MOUTH IN THE MORNING AND 1 TABLET IN THE EVENING 270 tablet 1  . ibuprofen (ADVIL,MOTRIN) 200 MG tablet Take 200 mg by mouth daily as needed for headache or moderate pain.    Marland Kitchen ipratropium (ATROVENT) 0.03 % nasal spray Place 2 sprays into both nostrils 2 (two) times daily.  11  . levothyroxine (SYNTHROID) 25 MCG tablet TAKE 1 TABLET(25 MCG) BY MOUTH DAILY BEFORE BREAKFAST 90 tablet 3  . Magnesium Oxide 500 MG CAPS Take 2,000 mg by mouth daily.    . metoprolol succinate (TOPROL-XL) 25 MG 24 hr tablet Take 1 tablet (25 mg total) by mouth 2 (two) times daily. 60 tablet 11  .  spironolactone (ALDACTONE) 25 MG tablet TAKE 1 TABLET(25 MG) BY MOUTH EVERY EVENING 90 tablet 1  . tretinoin (RETIN-A) 0.1 % cream Apply 1 application topically every morning.     . zolpidem (AMBIEN) 5 MG tablet TAKE 1 TABLET BY MOUTH EVERY DAY AT BEDTIME AS NEEDED 30 tablet 0   No current facility-administered medications for this visit.      Past Medical History:  Diagnosis Date  . AICD (automatic cardioverter/defibrillator) present   . Arthritis    "left ankle" (07/14/2015)  . Atrial fibrillation ([place])   . CHF (congestive heart failure) ([place])   . Colon cancer ([place]) 03/2006   T3, N0  . Colon polyps 12/07/2010  . Coronary artery disease    nonobstructive with 30% D1  . GERD (gastroesophageal reflux disease)   . History of ovarian cancer 1985  . Hx: UTI (urinary tract infection)   . Hyperlipidemia   . Hypertension   . Internal hemorrhoid   . Nonischemic cardiomyopathy ([place])    last EF assessment 40% by MUGA  . Partial bowel obstruction ([place])   . PVC (premature ventricular contraction)     ROS:   All systems reviewed and negative except as noted in the HPI.   Past Surgical History:  Procedure Laterality Date  .  ABDOMINAL HYSTERECTOMY  1979  . ANKLE CLOSED REDUCTION  10/16/2011   Procedure: CLOSED REDUCTION ANKLE;  Surgeon: Tobi Bastos, MD;  [facility];  Service: Orthopedics;  Laterality: Left;  . COLECTOMY  2007   for colon cancer  . COLONOSCOPY N/A 03/11/2013   Procedure: COLONOSCOPY;  Surgeon: Ladene Artist, MD;  [facility];  Service: Endoscopy;  Laterality: N/A;  . COLONOSCOPY WITH PROPOFOL N/A 04/21/2015   Procedure: COLONOSCOPY WITH PROPOFOL;  Surgeon: Ladene Artist, MD;  [facility];  Service: Endoscopy;  Laterality: N/A;  . COLONOSCOPY WITH PROPOFOL N/A 02/05/2018   Procedure: COLONOSCOPY WITH PROPOFOL Hemostasis Clip;  Surgeon: Ladene Artist, MD;  [facility];  Service: Endoscopy;  Laterality: N/A;  . EP  IMPLANTABLE DEVICE N/A 07/14/2015   Procedure: BiV ICD Insertion CRT-D;  Surgeon: Evans Lance, MD;  Location: [place] [facility];  Service: Cardiovascular;  Laterality: N/A;  . ESOPHAGOGASTRODUODENOSCOPY (EGD) WITH PROPOFOL N/A 04/21/2015   Procedure: ESOPHAGOGASTRODUODENOSCOPY (EGD) WITH PROPOFOL;  Surgeon: Ladene Artist, MD;  [facility];  Service: Endoscopy;  Laterality: N/A;  . FRACTURE SURGERY    . INSERTION OF ICD Left 07/14/2015  . LAPAROTOMY  1980   "adhesions"  . LAPAROTOMY  1989   laparotomy for takedown of intestinal obstruction secondary to adhesions   . OOPHORECTOMY  1961  . OVARY SURGERY Right 1985   resection of right ovarian cancer  . POLYPECTOMY  02/05/2018   Procedure: POLYPECTOMY;  Surgeon: Ladene Artist, MD;  [facility];  Service: Endoscopy;;  . SMALL INTESTINE SURGERY  1985  . TONSILLECTOMY  1944     Family History  Problem Relation Age of Onset  . Uterine cancer Mother   . Prostate cancer Father   . Heart disease Father   . Heart attack Father   . Colon cancer Neg Hx      Social History   Socioeconomic History  . Marital status: Married    Spouse name: Not on file  . Number of children: 2  . Years of education: Not on file  . Highest education level: Not on file  Occupational History  . Occupation: retired  Scientific laboratory technician  . Financial resource strain: Not on file  . Food insecurity    Worry: Not on file    Inability: Not on file  . Transportation needs    Medical: Not on file    Non-medical: Not on file  Tobacco Use  . Smoking status: Former Smoker    Packs/day: 0.50    Years: 20.00    Pack years: 10.00    Types: Cigarettes    Quit date: 04/18/1978    Years since quitting: 40.6  . Smokeless tobacco: Never Used  Substance and Sexual Activity  . Alcohol use: Yes    Alcohol/week: 14.0 standard drinks    Types: 14 Glasses of wine per week    Comment: has wine before dinner   . Drug use: No  . Sexual activity: Not  Currently  Lifestyle  . Physical activity    Days per week: Not on file    Minutes per session: Not on file  . Stress: Not on file  Relationships  . Social Herbalist on phone: Not on file    Gets together: Not on file    Attends religious service: Not on file    Active member of club or organization: Not on file    Attends meetings  of clubs or organizations: Not on file    Relationship status: Not on file  . Intimate partner violence    Fear of current or ex partner: Not on file    Emotionally abused: Not on file    Physically abused: Not on file    Forced sexual activity: Not on file  Other Topics Concern  . Not on file  Social History Narrative   Patient had never smoked.   Alcohol use- yes   Daily caffeine use- coffee   Illicit drug use- no   Occupation:retired      BP 102/68   Pulse 88   Ht 5\' 5"  (1.651 m)   Wt 141 lb (64 kg)   SpO2 95%   BMI 23.46 kg/m   Physical Exam:  Well appearing NAD HEENT: Unremarkable Neck:  No JVD, no thyromegally Lymphatics:  No adenopathy Back:  No CVA tenderness Lungs:  Clear with no wheezes HEART:  Regular rate rhythm, no murmurs, no rubs, no clicks Abd:  soft, positive bowel sounds, no organomegally, no rebound, no guarding Ext:  2 plus pulses, no edema, no cyanosis, no clubbing Skin:  No rashes no nodules Neuro:  CN II through XII intact, motor grossly intact  EKG - NSR with biv pacing  DEVICE  Normal device function.  See PaceArt for details.   Assess/Plan: 1. CHB - she does have an escape today. She is asymptomatic, pacing 98% of the time. 2. ICD - her Medtronic Biv ICD is working normally. We will recheck in several months. 3. Chronic systolic heart failure - her symptoms are class 2. She is encouraged to remain active, avoid sodium and take her meds.  Mikle Bosworth.D.

## 2018-12-10 NOTE — Patient Instructions (Signed)
Medication Instructions:  Your physician recommends that you continue on your current medications as directed. Please refer to the Current Medication list given to you today.  If you need a refill on your cardiac medications before your next appointment, please call your pharmacy.   Lab work: None Ordered   Testing/Procedures: None Ordered   Follow-Up: Your physician recommends that you schedule a follow-up appointment in: 1 year with Dr. Taylor  

## 2018-12-15 DIAGNOSIS — Z20828 Contact with and (suspected) exposure to other viral communicable diseases: Secondary | ICD-10-CM | POA: Diagnosis not present

## 2018-12-15 DIAGNOSIS — S72011A Unspecified intracapsular fracture of right femur, initial encounter for closed fracture: Secondary | ICD-10-CM | POA: Diagnosis not present

## 2018-12-15 DIAGNOSIS — Z7901 Long term (current) use of anticoagulants: Secondary | ICD-10-CM | POA: Diagnosis not present

## 2018-12-15 DIAGNOSIS — W1830XA Fall on same level, unspecified, initial encounter: Secondary | ICD-10-CM | POA: Diagnosis not present

## 2018-12-15 DIAGNOSIS — R55 Syncope and collapse: Secondary | ICD-10-CM | POA: Diagnosis not present

## 2018-12-15 DIAGNOSIS — I509 Heart failure, unspecified: Secondary | ICD-10-CM | POA: Diagnosis not present

## 2018-12-15 DIAGNOSIS — Z79899 Other long term (current) drug therapy: Secondary | ICD-10-CM | POA: Diagnosis not present

## 2018-12-15 DIAGNOSIS — S72001A Fracture of unspecified part of neck of right femur, initial encounter for closed fracture: Secondary | ICD-10-CM | POA: Diagnosis not present

## 2018-12-15 DIAGNOSIS — I110 Hypertensive heart disease with heart failure: Secondary | ICD-10-CM | POA: Diagnosis not present

## 2018-12-15 DIAGNOSIS — Z791 Long term (current) use of non-steroidal anti-inflammatories (NSAID): Secondary | ICD-10-CM | POA: Diagnosis not present

## 2018-12-15 DIAGNOSIS — I482 Chronic atrial fibrillation, unspecified: Secondary | ICD-10-CM | POA: Diagnosis not present

## 2018-12-15 DIAGNOSIS — Z880 Allergy status to penicillin: Secondary | ICD-10-CM | POA: Diagnosis not present

## 2018-12-15 DIAGNOSIS — J984 Other disorders of lung: Secondary | ICD-10-CM | POA: Diagnosis not present

## 2018-12-15 DIAGNOSIS — Z881 Allergy status to other antibiotic agents status: Secondary | ICD-10-CM | POA: Diagnosis not present

## 2018-12-15 DIAGNOSIS — T1490XA Injury, unspecified, initial encounter: Secondary | ICD-10-CM | POA: Diagnosis not present

## 2018-12-16 ENCOUNTER — Inpatient Hospital Stay (HOSPITAL_COMMUNITY): Payer: Medicare Other

## 2018-12-16 ENCOUNTER — Inpatient Hospital Stay: Payer: Self-pay

## 2018-12-16 ENCOUNTER — Other Ambulatory Visit: Payer: Self-pay

## 2018-12-16 ENCOUNTER — Inpatient Hospital Stay (HOSPITAL_COMMUNITY)
Admission: AD | Admit: 2018-12-16 | Discharge: 2018-12-21 | DRG: 470 | Disposition: A | Discharge status: Home Health | Payer: Medicare Other | Source: Other Acute Inpatient Hospital | Attending: Internal Medicine | Admitting: Internal Medicine

## 2018-12-16 ENCOUNTER — Encounter (HOSPITAL_COMMUNITY): Payer: Self-pay

## 2018-12-16 DIAGNOSIS — M19072 Primary osteoarthritis, left ankle and foot: Secondary | ICD-10-CM | POA: Diagnosis present

## 2018-12-16 DIAGNOSIS — Z79899 Other long term (current) drug therapy: Secondary | ICD-10-CM

## 2018-12-16 DIAGNOSIS — Y9301 Activity, walking, marching and hiking: Secondary | ICD-10-CM | POA: Diagnosis present

## 2018-12-16 DIAGNOSIS — Z87891 Personal history of nicotine dependence: Secondary | ICD-10-CM | POA: Diagnosis not present

## 2018-12-16 DIAGNOSIS — W1830XA Fall on same level, unspecified, initial encounter: Secondary | ICD-10-CM | POA: Diagnosis present

## 2018-12-16 DIAGNOSIS — R55 Syncope and collapse: Secondary | ICD-10-CM

## 2018-12-16 DIAGNOSIS — E039 Hypothyroidism, unspecified: Secondary | ICD-10-CM | POA: Diagnosis present

## 2018-12-16 DIAGNOSIS — I4891 Unspecified atrial fibrillation: Secondary | ICD-10-CM | POA: Diagnosis present

## 2018-12-16 DIAGNOSIS — Z8042 Family history of malignant neoplasm of prostate: Secondary | ICD-10-CM

## 2018-12-16 DIAGNOSIS — I2510 Atherosclerotic heart disease of native coronary artery without angina pectoris: Secondary | ICD-10-CM | POA: Diagnosis present

## 2018-12-16 DIAGNOSIS — Z86010 Personal history of colonic polyps: Secondary | ICD-10-CM

## 2018-12-16 DIAGNOSIS — S72011A Unspecified intracapsular fracture of right femur, initial encounter for closed fracture: Principal | ICD-10-CM | POA: Diagnosis present

## 2018-12-16 DIAGNOSIS — I480 Paroxysmal atrial fibrillation: Secondary | ICD-10-CM

## 2018-12-16 DIAGNOSIS — Z882 Allergy status to sulfonamides status: Secondary | ICD-10-CM | POA: Diagnosis not present

## 2018-12-16 DIAGNOSIS — I5022 Chronic systolic (congestive) heart failure: Secondary | ICD-10-CM | POA: Diagnosis not present

## 2018-12-16 DIAGNOSIS — Z20828 Contact with and (suspected) exposure to other viral communicable diseases: Secondary | ICD-10-CM | POA: Diagnosis present

## 2018-12-16 DIAGNOSIS — Z95810 Presence of automatic (implantable) cardiac defibrillator: Secondary | ICD-10-CM

## 2018-12-16 DIAGNOSIS — I509 Heart failure, unspecified: Secondary | ICD-10-CM | POA: Diagnosis not present

## 2018-12-16 DIAGNOSIS — I071 Rheumatic tricuspid insufficiency: Secondary | ICD-10-CM | POA: Diagnosis present

## 2018-12-16 DIAGNOSIS — I482 Chronic atrial fibrillation, unspecified: Secondary | ICD-10-CM | POA: Diagnosis not present

## 2018-12-16 DIAGNOSIS — Z881 Allergy status to other antibiotic agents status: Secondary | ICD-10-CM | POA: Diagnosis not present

## 2018-12-16 DIAGNOSIS — Z8543 Personal history of malignant neoplasm of ovary: Secondary | ICD-10-CM | POA: Diagnosis not present

## 2018-12-16 DIAGNOSIS — E876 Hypokalemia: Secondary | ICD-10-CM | POA: Diagnosis present

## 2018-12-16 DIAGNOSIS — S8991XA Unspecified injury of right lower leg, initial encounter: Secondary | ICD-10-CM | POA: Diagnosis not present

## 2018-12-16 DIAGNOSIS — Z85038 Personal history of other malignant neoplasm of large intestine: Secondary | ICD-10-CM

## 2018-12-16 DIAGNOSIS — Z471 Aftercare following joint replacement surgery: Secondary | ICD-10-CM | POA: Diagnosis not present

## 2018-12-16 DIAGNOSIS — Z90721 Acquired absence of ovaries, unilateral: Secondary | ICD-10-CM

## 2018-12-16 DIAGNOSIS — I2721 Secondary pulmonary arterial hypertension: Secondary | ICD-10-CM | POA: Diagnosis present

## 2018-12-16 DIAGNOSIS — I428 Other cardiomyopathies: Secondary | ICD-10-CM | POA: Diagnosis present

## 2018-12-16 DIAGNOSIS — I10 Essential (primary) hypertension: Secondary | ICD-10-CM | POA: Diagnosis present

## 2018-12-16 DIAGNOSIS — Z7901 Long term (current) use of anticoagulants: Secondary | ICD-10-CM | POA: Diagnosis not present

## 2018-12-16 DIAGNOSIS — Y92009 Unspecified place in unspecified non-institutional (private) residence as the place of occurrence of the external cause: Secondary | ICD-10-CM | POA: Diagnosis not present

## 2018-12-16 DIAGNOSIS — Z8049 Family history of malignant neoplasm of other genital organs: Secondary | ICD-10-CM

## 2018-12-16 DIAGNOSIS — M25561 Pain in right knee: Secondary | ICD-10-CM | POA: Diagnosis not present

## 2018-12-16 DIAGNOSIS — S72001A Fracture of unspecified part of neck of right femur, initial encounter for closed fracture: Secondary | ICD-10-CM

## 2018-12-16 DIAGNOSIS — Z79890 Hormone replacement therapy (postmenopausal): Secondary | ICD-10-CM

## 2018-12-16 DIAGNOSIS — Z9049 Acquired absence of other specified parts of digestive tract: Secondary | ICD-10-CM | POA: Diagnosis not present

## 2018-12-16 DIAGNOSIS — Z888 Allergy status to other drugs, medicaments and biological substances status: Secondary | ICD-10-CM | POA: Diagnosis not present

## 2018-12-16 DIAGNOSIS — I110 Hypertensive heart disease with heart failure: Secondary | ICD-10-CM | POA: Diagnosis not present

## 2018-12-16 DIAGNOSIS — E785 Hyperlipidemia, unspecified: Secondary | ICD-10-CM | POA: Diagnosis present

## 2018-12-16 DIAGNOSIS — K219 Gastro-esophageal reflux disease without esophagitis: Secondary | ICD-10-CM | POA: Diagnosis not present

## 2018-12-16 DIAGNOSIS — Z01810 Encounter for preprocedural cardiovascular examination: Secondary | ICD-10-CM | POA: Diagnosis not present

## 2018-12-16 DIAGNOSIS — Z96641 Presence of right artificial hip joint: Secondary | ICD-10-CM

## 2018-12-16 DIAGNOSIS — E7849 Other hyperlipidemia: Secondary | ICD-10-CM | POA: Diagnosis not present

## 2018-12-16 DIAGNOSIS — S72031A Displaced midcervical fracture of right femur, initial encounter for closed fracture: Secondary | ICD-10-CM | POA: Diagnosis not present

## 2018-12-16 DIAGNOSIS — S7290XA Unspecified fracture of unspecified femur, initial encounter for closed fracture: Secondary | ICD-10-CM | POA: Diagnosis not present

## 2018-12-16 DIAGNOSIS — E875 Hyperkalemia: Secondary | ICD-10-CM | POA: Diagnosis not present

## 2018-12-16 DIAGNOSIS — Z90710 Acquired absence of both cervix and uterus: Secondary | ICD-10-CM

## 2018-12-16 DIAGNOSIS — Z8249 Family history of ischemic heart disease and other diseases of the circulatory system: Secondary | ICD-10-CM

## 2018-12-16 DIAGNOSIS — Z419 Encounter for procedure for purposes other than remedying health state, unspecified: Secondary | ICD-10-CM

## 2018-12-16 DIAGNOSIS — I951 Orthostatic hypotension: Secondary | ICD-10-CM | POA: Diagnosis present

## 2018-12-16 DIAGNOSIS — W19XXXA Unspecified fall, initial encounter: Secondary | ICD-10-CM

## 2018-12-16 LAB — URINALYSIS, ROUTINE W REFLEX MICROSCOPIC
Bilirubin Urine: NEGATIVE
Glucose, UA: NEGATIVE mg/dL
Ketones, ur: 5 mg/dL — AB
Leukocytes,Ua: NEGATIVE
Nitrite: NEGATIVE
Protein, ur: NEGATIVE mg/dL
Specific Gravity, Urine: 1.016 (ref 1.005–1.030)
pH: 5 (ref 5.0–8.0)

## 2018-12-16 LAB — PROTIME-INR
INR: 1.2 (ref 0.8–1.2)
Prothrombin Time: 14.9 seconds (ref 11.4–15.2)

## 2018-12-16 LAB — COMPREHENSIVE METABOLIC PANEL
ALT: 66 U/L — ABNORMAL HIGH (ref 0–44)
AST: 71 U/L — ABNORMAL HIGH (ref 15–41)
Albumin: 4.1 g/dL (ref 3.5–5.0)
Alkaline Phosphatase: 56 U/L (ref 38–126)
Anion gap: 12 (ref 5–15)
BUN: 21 mg/dL (ref 8–23)
CO2: 27 mmol/L (ref 22–32)
Calcium: 8.7 mg/dL — ABNORMAL LOW (ref 8.9–10.3)
Chloride: 96 mmol/L — ABNORMAL LOW (ref 98–111)
Creatinine, Ser: 0.97 mg/dL (ref 0.44–1.00)
GFR calc Af Amer: >60 mL/min (ref >60)
GFR calc non Af Amer: 55 mL/min — ABNORMAL LOW (ref >60)
Glucose, Bld: 102 mg/dL — ABNORMAL HIGH (ref 70–99)
Potassium: 3.3 mmol/L — ABNORMAL LOW (ref 3.5–5.1)
Sodium: 135 mmol/L (ref 135–145)
Total Bilirubin: 1.3 mg/dL — ABNORMAL HIGH (ref 0.3–1.2)
Total Protein: 6.6 g/dL (ref 6.5–8.1)

## 2018-12-16 LAB — CBC WITH DIFFERENTIAL/PLATELET
Abs Immature Granulocytes: 0.02 10*3/uL (ref 0.00–0.07)
Basophils Absolute: 0 10*3/uL (ref 0.0–0.1)
Basophils Relative: 0 %
Eosinophils Absolute: 0 10*3/uL (ref 0.0–0.5)
Eosinophils Relative: 0 %
HCT: 37.4 % (ref 36.0–46.0)
Hemoglobin: 12.4 g/dL (ref 12.0–15.0)
Immature Granulocytes: 0 %
Lymphocytes Relative: 10 %
Lymphs Abs: 0.8 10*3/uL (ref 0.7–4.0)
MCH: 31.9 pg (ref 26.0–34.0)
MCHC: 33.2 g/dL (ref 30.0–36.0)
MCV: 96.1 fL (ref 80.0–100.0)
Monocytes Absolute: 0.9 10*3/uL (ref 0.1–1.0)
Monocytes Relative: 12 %
Neutro Abs: 6.2 10*3/uL (ref 1.7–7.7)
Neutrophils Relative %: 78 %
Platelets: 130 10*3/uL — ABNORMAL LOW (ref 150–400)
RBC: 3.89 MIL/uL (ref 3.87–5.11)
RDW: 13.1 % (ref 11.5–15.5)
WBC: 8 10*3/uL (ref 4.0–10.5)
nRBC: 0 % (ref 0.0–0.2)

## 2018-12-16 LAB — APTT: aPTT: 51 seconds — ABNORMAL HIGH (ref 24–36)

## 2018-12-16 LAB — ECHOCARDIOGRAM COMPLETE

## 2018-12-16 LAB — HEPARIN LEVEL (UNFRACTIONATED): Heparin Unfractionated: 2.08 IU/mL — ABNORMAL HIGH (ref 0.30–0.70)

## 2018-12-16 MED ORDER — SACUBITRIL-VALSARTAN 24-26 MG PO TABS
1.0000 | ORAL_TABLET | Freq: Two times a day (BID) | ORAL | Status: DC
  Administered 2018-12-16: 1 via ORAL
  Filled 2018-12-16 (×2): qty 1

## 2018-12-16 MED ORDER — HEPARIN (PORCINE) 25000 UT/250ML-% IV SOLN
950.0000 [IU]/h | INTRAVENOUS | Status: DC
  Administered 2018-12-16 – 2018-12-17 (×2): 1050 [IU]/h via INTRAVENOUS
  Filled 2018-12-16: qty 250

## 2018-12-16 MED ORDER — AMIODARONE HCL 200 MG PO TABS
200.0000 mg | ORAL_TABLET | Freq: Every day | ORAL | Status: DC
  Administered 2018-12-16 – 2018-12-21 (×6): 200 mg via ORAL
  Filled 2018-12-16 (×6): qty 1

## 2018-12-16 MED ORDER — SACUBITRIL-VALSARTAN 24-26 MG PO TABS
1.0000 | ORAL_TABLET | Freq: Two times a day (BID) | ORAL | Status: DC
  Filled 2018-12-16 (×2): qty 1

## 2018-12-16 MED ORDER — ENOXAPARIN SODIUM 40 MG/0.4ML ~~LOC~~ SOLN: 40.0000 mg | SUBCUTANEOUS | Status: DC

## 2018-12-16 MED ORDER — ACETAMINOPHEN 500 MG PO TABS
500.0000 mg | ORAL_TABLET | Freq: Four times a day (QID) | ORAL | Status: DC
  Administered 2018-12-16 – 2018-12-21 (×20): 500 mg via ORAL
  Filled 2018-12-16 (×21): qty 1

## 2018-12-16 MED ORDER — ONDANSETRON HCL 4 MG/2ML IJ SOLN
4.0000 mg | Freq: Four times a day (QID) | INTRAMUSCULAR | Status: DC | PRN
  Administered 2018-12-18 (×2): 4 mg via INTRAVENOUS
  Filled 2018-12-16: qty 2

## 2018-12-16 MED ORDER — SODIUM CHLORIDE 0.9 % IV BOLUS: 250.0000 mL | Freq: Once | INTRAVENOUS | Status: DC

## 2018-12-16 MED ORDER — METOPROLOL SUCCINATE ER 25 MG PO TB24
25.0000 mg | ORAL_TABLET | Freq: Two times a day (BID) | ORAL | Status: DC
  Administered 2018-12-16: 25 mg via ORAL
  Filled 2018-12-16: qty 1

## 2018-12-16 MED ORDER — CITALOPRAM HYDROBROMIDE 20 MG PO TABS
20.0000 mg | ORAL_TABLET | Freq: Every day | ORAL | Status: DC
  Administered 2018-12-16 – 2018-12-21 (×6): 20 mg via ORAL
  Filled 2018-12-16 (×6): qty 1

## 2018-12-16 MED ORDER — ZOLPIDEM TARTRATE 5 MG PO TABS
5.0000 mg | ORAL_TABLET | Freq: Every evening | ORAL | Status: DC | PRN
  Administered 2018-12-17 – 2018-12-20 (×3): 5 mg via ORAL
  Filled 2018-12-16 (×4): qty 1

## 2018-12-16 MED ORDER — POTASSIUM CHLORIDE CRYS ER 20 MEQ PO TBCR
40.0000 meq | EXTENDED_RELEASE_TABLET | Freq: Once | ORAL | Status: AC
  Administered 2018-12-16: 40 meq via ORAL
  Filled 2018-12-16: qty 2

## 2018-12-16 MED ORDER — ATORVASTATIN CALCIUM 40 MG PO TABS
40.0000 mg | ORAL_TABLET | Freq: Every day | ORAL | Status: DC
  Administered 2018-12-16 – 2018-12-21 (×6): 40 mg via ORAL
  Filled 2018-12-16 (×6): qty 1

## 2018-12-16 MED ORDER — LEVOTHYROXINE SODIUM 25 MCG PO TABS
25.0000 ug | ORAL_TABLET | Freq: Every day | ORAL | Status: DC
  Administered 2018-12-17 – 2018-12-21 (×5): 25 ug via ORAL
  Filled 2018-12-16 (×5): qty 1

## 2018-12-16 MED ORDER — SODIUM CHLORIDE 0.9 % IV SOLN
INTRAVENOUS | Status: DC
  Administered 2018-12-16 – 2018-12-19 (×6): via INTRAVENOUS

## 2018-12-16 MED ORDER — METOPROLOL SUCCINATE ER 25 MG PO TB24
25.0000 mg | ORAL_TABLET | Freq: Two times a day (BID) | ORAL | Status: DC
  Administered 2018-12-17 – 2018-12-19 (×5): 25 mg via ORAL
  Filled 2018-12-16 (×6): qty 1

## 2018-12-16 MED ORDER — KETOROLAC TROMETHAMINE 15 MG/ML IJ SOLN
15.0000 mg | Freq: Three times a day (TID) | INTRAMUSCULAR | Status: DC | PRN
  Administered 2018-12-16: 15 mg via INTRAVENOUS
  Filled 2018-12-16: qty 1

## 2018-12-16 MED ORDER — HEPARIN (PORCINE) 25000 UT/250ML-% IV SOLN
900.0000 [IU]/h | INTRAVENOUS | Status: DC
  Administered 2018-12-16: 900 [IU]/h via INTRAVENOUS
  Filled 2018-12-16: qty 250

## 2018-12-16 NOTE — Consult Note (Signed)
Reason for Consult: Right hip pain subcapital right femur fracture per admission H&P referring Physician: Dr. Alene Mires Cynthia Frank is an 83 y.o. female.  HPI: Very pleasant 83 year old Caucasian female was [place] in [facility] yesterday had a brief syncopal.  And fell onto her right side.  She was taken to [facility] where she was evaluated found to have a right hip fracture and patient requested transfer to [facility] for definitive care.  She arrived this morning and was seen by Dr. Karleen Hampshire and I spoke with her concerning the consult.  Currently she is comfortable awaiting cardiology consult we are holding the Eliquis she has been placed on heparin for DVT prophylaxis it will need to be stopped prior to surgery.  Patient is only complaining of right hip groin and buttock pain no other complaints at this time.  Past Medical History:  Diagnosis Date  . AICD (automatic cardioverter/defibrillator) present   . Arthritis    "left ankle" (07/14/2015)  . Atrial fibrillation ([place])   . CHF (congestive heart failure) ([place])   . Colon cancer ([place]) 03/2006   T3, N0  . Colon polyps 12/07/2010  . Coronary artery disease    nonobstructive with 30% D1  . GERD (gastroesophageal reflux disease)   . History of ovarian cancer 1985  . Hx: UTI (urinary tract infection)   . Hyperlipidemia   . Hypertension   . Internal hemorrhoid   . Nonischemic cardiomyopathy ([place])    last EF assessment 40% by MUGA  . Partial bowel obstruction ([place])   . PVC (premature ventricular contraction)     Past Surgical History:  Procedure Laterality Date  . ABDOMINAL HYSTERECTOMY  1979  . ANKLE CLOSED REDUCTION  10/16/2011   Procedure: CLOSED REDUCTION ANKLE;  Surgeon: Tobi Bastos, MD;  [facility];  Service: Orthopedics;  Laterality: Left;  . COLECTOMY  2007   for colon cancer  . COLONOSCOPY N/A 03/11/2013   Procedure: COLONOSCOPY;  Surgeon: Ladene Artist, MD;  [facility];   Service: Endoscopy;  Laterality: N/A;  . COLONOSCOPY WITH PROPOFOL N/A 04/21/2015   Procedure: COLONOSCOPY WITH PROPOFOL;  Surgeon: Ladene Artist, MD;  [facility];  Service: Endoscopy;  Laterality: N/A;  . COLONOSCOPY WITH PROPOFOL N/A 02/05/2018   Procedure: COLONOSCOPY WITH PROPOFOL Hemostasis Clip;  Surgeon: Ladene Artist, MD;  [facility];  Service: Endoscopy;  Laterality: N/A;  . EP IMPLANTABLE DEVICE N/A 07/14/2015   Procedure: BiV ICD Insertion CRT-D;  Surgeon: Evans Lance, MD;  Location: [place] [facility];  Service: Cardiovascular;  Laterality: N/A;  . ESOPHAGOGASTRODUODENOSCOPY (EGD) WITH PROPOFOL N/A 04/21/2015   Procedure: ESOPHAGOGASTRODUODENOSCOPY (EGD) WITH PROPOFOL;  Surgeon: Ladene Artist, MD;  [facility];  Service: Endoscopy;  Laterality: N/A;  . FRACTURE SURGERY    . INSERTION OF ICD Left 07/14/2015  . LAPAROTOMY  1980   "adhesions"  . LAPAROTOMY  1989   laparotomy for takedown of intestinal obstruction secondary to adhesions   . OOPHORECTOMY  1961  . OVARY SURGERY Right 1985   resection of right ovarian cancer  . POLYPECTOMY  02/05/2018   Procedure: POLYPECTOMY;  Surgeon: Ladene Artist, MD;  [facility];  Service: Endoscopy;;  . SMALL INTESTINE SURGERY  1985  . TONSILLECTOMY  1944    Family History  Problem Relation Age of Onset  . Uterine cancer Mother   . Prostate cancer Father   . Heart disease Father   .  Heart attack Father   . Colon cancer Neg Hx     Social History:  reports that she quit smoking about 40 years ago. Her smoking use included cigarettes. She has a 10.00 pack-year smoking history. She has never used smokeless tobacco. She reports current alcohol use of about 14.0 standard drinks of alcohol per week. She reports that she does not use drugs.  Allergies:  Allergies  Allergen Reactions  . Clindamycin/Lincomycin     itching  . Dramamine Ii [Meclizine Hcl]   . Pneumococcal Vaccines Itching  and Swelling    Redness   . Sulfonamide Derivatives Itching and Swelling    Medications:  Scheduled: . acetaminophen  500 mg Oral Q6H  . amiodarone  200 mg Oral Daily  . atorvastatin  40 mg Oral Daily  . citalopram  20 mg Oral Daily  . [START ON 12/17/2018] levothyroxine  25 mcg Oral QAC breakfast  . metoprolol succinate  25 mg Oral BID  . potassium chloride  40 mEq Oral Once  . sacubitril-valsartan  1 tablet Oral BID    Results for orders placed or performed during the hospital encounter of 12/16/18 (from the past 48 hour(s))  CBC with Differential/Platelet     Status: Abnormal   Collection Time: 12/16/18  7:27 AM  Result Value Ref Range   WBC 8.0 4.0 - 10.5 K/uL   RBC 3.89 3.87 - 5.11 MIL/uL   Hemoglobin 12.4 12.0 - 15.0 g/dL   HCT 37.4 36.0 - 46.0 %   MCV 96.1 80.0 - 100.0 fL   MCH 31.9 26.0 - 34.0 pg   MCHC 33.2 30.0 - 36.0 g/dL   RDW 13.1 11.5 - 15.5 %   Platelets 130 (L) 150 - 400 K/uL    Comment: REPEATED TO VERIFY   nRBC 0.0 0.0 - 0.2 %   Neutrophils Relative % 78 %   Neutro Abs 6.2 1.7 - 7.7 K/uL   Lymphocytes Relative 10 %   Lymphs Abs 0.8 0.7 - 4.0 K/uL   Monocytes Relative 12 %   Monocytes Absolute 0.9 0.1 - 1.0 K/uL   Eosinophils Relative 0 %   Eosinophils Absolute 0.0 0.0 - 0.5 K/uL   Basophils Relative 0 %   Basophils Absolute 0.0 0.0 - 0.1 K/uL   Immature Granulocytes 0 %   Abs Immature Granulocytes 0.02 0.00 - 0.07 K/uL    Comment: Performed at [facility], [place] [address], [place], [place] [address]  Comprehensive metabolic panel     Status: Abnormal   Collection Time: 12/16/18  7:27 AM  Result Value Ref Range   Sodium 135 135 - 145 mmol/L   Potassium 3.3 (L) 3.5 - 5.1 mmol/L   Chloride 96 (L) 98 - 111 mmol/L   CO2 27 22 - 32 mmol/L   Glucose, Bld 102 (H) 70 - 99 mg/dL   BUN 21 8 - 23 mg/dL   Creatinine, Ser 0.97 0.44 - 1.00 mg/dL   Calcium 8.7 (L) 8.9 - 10.3 mg/dL   Total Protein 6.6 6.5 - 8.1 g/dL   Albumin 4.1 3.5 - 5.0  g/dL   AST 71 (H) 15 - 41 U/L   ALT 66 (H) 0 - 44 U/L   Alkaline Phosphatase 56 38 - 126 U/L   Total Bilirubin 1.3 (H) 0.3 - 1.2 mg/dL   GFR calc non Af Amer 55 (L) >60 mL/min   GFR calc Af Amer >60 >60 mL/min   Anion gap 12 5 - 15  Comment: Performed at [facility], [place] [address], [place], [place] [address]  Protime-INR     Status: None   Collection Time: 12/16/18  7:27 AM  Result Value Ref Range   Prothrombin Time 14.9 11.4 - 15.2 seconds   INR 1.2 0.8 - 1.2    Comment: (NOTE) INR goal varies based on device and disease states. Performed at [facility], [place] [address], [place], [place] [address]      Blood pressure 112/81, pulse 64, temperature 98.3 F (36.8 C), temperature source Oral, resp. rate 15, SpO2 96 %. Patient is awake and alert oriented to person place time and circumstance aware of his needed surgery and very pleasant in conversation. Neck no complaints upper extremities unremarkable no back pain left lower extremity unremarkable right lower extremity slightly shortened mildly externally rotated ice packs on right groin and hip mild swelling about right hip proximal thigh and groin region.  Thigh knee leg foot ankle distal unremarkable.  Right foot circulation intact normal dorsiflexion plantarflexion and sensation.  Assessment/Plan: Very pleasant 83 year old Caucasian female with syncopal induced fall and subsequent right subcapital femur fracture by report.  No x-rays available at this time to review they have been ordered.  Cardiology clearance has been ordered by Dr. Duayne Cal is held heparin started.  Would recommend strict bedrest but may turn side to side for skin checks.  Will need surgery for right hip and it will be determined what type when x-rays are available.  I will contact a partner discussed situation and make further plans based upon that.  For now I asked Dr. Karleen Hampshire to make her n.p.o. after midnight in  preparation for possible surgery tomorrow but that may change and if so we will make appropriate modifications to her treatment.  All questions were encouraged and answered with patient.  Relayed more information later after x-rays reviewed and definitive plan decided.  Thank you for allowing [place] to see Mrs. Quentin Ore. Saxton Chain ANDREW 12/16/2018, 10:36 AM

## 2018-12-16 NOTE — H&P (View-Only) (Signed)
Reason for Consult: Right hip pain subcapital right femur fracture per admission H&P referring Physician: Dr. Alene Mires Cynthia Frank is an 83 y.o. female.  HPI: Very pleasant 83 year old Caucasian female was [place] house in [facility] yesterday had a brief syncopal.  And fell onto her right side.  She was taken to [facility] where she was evaluated found to have a right hip fracture and patient requested transfer to [name] for definitive care.  She arrived this morning and was seen by Dr. Karleen Hampshire and I spoke with her concerning the consult.  Currently she is comfortable awaiting cardiology consult we are holding the Eliquis she has been placed on heparin for DVT prophylaxis it will need to be stopped prior to surgery.  Patient is only complaining of right hip groin and buttock pain no other complaints at this time.  Past Medical History:  Diagnosis Date  . AICD (automatic cardioverter/defibrillator) present   . Arthritis    "left ankle" (07/14/2015)  . Atrial fibrillation ([place])   . CHF (congestive heart failure) ([place])   . Colon cancer ([place]) 03/2006   T3, N0  . Colon polyps 12/07/2010  . Coronary artery disease    nonobstructive with 30% D1  . GERD (gastroesophageal reflux disease)   . History of ovarian cancer 1985  . Hx: UTI (urinary tract infection)   . Hyperlipidemia   . Hypertension   . Internal hemorrhoid   . Nonischemic cardiomyopathy ([place])    last EF assessment 40% by MUGA  . Partial bowel obstruction ([place])   . PVC (premature ventricular contraction)     Past Surgical History:  Procedure Laterality Date  . ABDOMINAL HYSTERECTOMY  1979  . ANKLE CLOSED REDUCTION  10/16/2011   Procedure: CLOSED REDUCTION ANKLE;  Surgeon: Tobi Bastos, MD;  [facility];  Service: Orthopedics;  Laterality: Left;  . COLECTOMY  2007   for colon cancer  . COLONOSCOPY N/A 03/11/2013   Procedure: COLONOSCOPY;  Surgeon: Ladene Artist, MD;  [facility];   Service: Endoscopy;  Laterality: N/A;  . COLONOSCOPY WITH PROPOFOL N/A 04/21/2015   Procedure: COLONOSCOPY WITH PROPOFOL;  Surgeon: Ladene Artist, MD;  [facility];  Service: Endoscopy;  Laterality: N/A;  . COLONOSCOPY WITH PROPOFOL N/A 02/05/2018   Procedure: COLONOSCOPY WITH PROPOFOL Hemostasis Clip;  Surgeon: Ladene Artist, MD;  [facility];  Service: Endoscopy;  Laterality: N/A;  . EP IMPLANTABLE DEVICE N/A 07/14/2015   Procedure: BiV ICD Insertion CRT-D;  Surgeon: Evans Lance, MD;  Location: [facility];  Service: Cardiovascular;  Laterality: N/A;  . ESOPHAGOGASTRODUODENOSCOPY (EGD) WITH PROPOFOL N/A 04/21/2015   Procedure: ESOPHAGOGASTRODUODENOSCOPY (EGD) WITH PROPOFOL;  Surgeon: Ladene Artist, MD;  [facility];  Service: Endoscopy;  Laterality: N/A;  . FRACTURE SURGERY    . INSERTION OF ICD Left 07/14/2015  . LAPAROTOMY  1980   "adhesions"  . LAPAROTOMY  1989   laparotomy for takedown of intestinal obstruction secondary to adhesions   . OOPHORECTOMY  1961  . OVARY SURGERY Right 1985   resection of right ovarian cancer  . POLYPECTOMY  02/05/2018   Procedure: POLYPECTOMY;  Surgeon: Ladene Artist, MD;  [facility];  Service: Endoscopy;;  . SMALL INTESTINE SURGERY  1985  . TONSILLECTOMY  1944    Family History  Problem Relation Age of Onset  . Uterine cancer Mother   . Prostate cancer Father   . Heart disease Father   .  Heart attack Father   . Colon cancer Neg Hx     Social History:  reports that she quit smoking about 40 years ago. Her smoking use included cigarettes. She has a 10.00 pack-year smoking history. She has never used smokeless tobacco. She reports current alcohol use of about 14.0 standard drinks of alcohol per week. She reports that she does not use drugs.  Allergies:  Allergies  Allergen Reactions  . Clindamycin/Lincomycin     itching  . Dramamine Ii [Meclizine Hcl]   . Pneumococcal Vaccines Itching  and Swelling    Redness   . Sulfonamide Derivatives Itching and Swelling    Medications:  Scheduled: . acetaminophen  500 mg Oral Q6H  . amiodarone  200 mg Oral Daily  . atorvastatin  40 mg Oral Daily  . citalopram  20 mg Oral Daily  . [START ON 12/17/2018] levothyroxine  25 mcg Oral QAC breakfast  . metoprolol succinate  25 mg Oral BID  . potassium chloride  40 mEq Oral Once  . sacubitril-valsartan  1 tablet Oral BID    Results for orders placed or performed during the hospital encounter of 12/16/18 (from the past 48 hour(s))  CBC with Differential/Platelet     Status: Abnormal   Collection Time: 12/16/18  7:27 AM  Result Value Ref Range   WBC 8.0 4.0 - 10.5 K/uL   RBC 3.89 3.87 - 5.11 MIL/uL   Hemoglobin 12.4 12.0 - 15.0 g/dL   HCT 37.4 36.0 - 46.0 %   MCV 96.1 80.0 - 100.0 fL   MCH 31.9 26.0 - 34.0 pg   MCHC 33.2 30.0 - 36.0 g/dL   RDW 13.1 11.5 - 15.5 %   Platelets 130 (L) 150 - 400 K/uL    Comment: REPEATED TO VERIFY   nRBC 0.0 0.0 - 0.2 %   Neutrophils Relative % 78 %   Neutro Abs 6.2 1.7 - 7.7 K/uL   Lymphocytes Relative 10 %   Lymphs Abs 0.8 0.7 - 4.0 K/uL   Monocytes Relative 12 %   Monocytes Absolute 0.9 0.1 - 1.0 K/uL   Eosinophils Relative 0 %   Eosinophils Absolute 0.0 0.0 - 0.5 K/uL   Basophils Relative 0 %   Basophils Absolute 0.0 0.0 - 0.1 K/uL   Immature Granulocytes 0 %   Abs Immature Granulocytes 0.02 0.00 - 0.07 K/uL    Comment: Performed at [facility], [place] [address] [place], [place] [address]  Comprehensive metabolic panel     Status: Abnormal   Collection Time: 12/16/18  7:27 AM  Result Value Ref Range   Sodium 135 135 - 145 mmol/L   Potassium 3.3 (L) 3.5 - 5.1 mmol/L   Chloride 96 (L) 98 - 111 mmol/L   CO2 27 22 - 32 mmol/L   Glucose, Bld 102 (H) 70 - 99 mg/dL   BUN 21 8 - 23 mg/dL   Creatinine, Ser 0.97 0.44 - 1.00 mg/dL   Calcium 8.7 (L) 8.9 - 10.3 mg/dL   Total Protein 6.6 6.5 - 8.1 g/dL   Albumin 4.1 3.5 - 5.0  g/dL   AST 71 (H) 15 - 41 U/L   ALT 66 (H) 0 - 44 U/L   Alkaline Phosphatase 56 38 - 126 U/L   Total Bilirubin 1.3 (H) 0.3 - 1.2 mg/dL   GFR calc non Af Amer 55 (L) >60 mL/min   GFR calc Af Amer >60 >60 mL/min   Anion gap 12 5 - 15  Comment: Performed at [facility], [place] [address], [place], [place] [address]  Protime-INR     Status: None   Collection Time: 12/16/18  7:27 AM  Result Value Ref Range   Prothrombin Time 14.9 11.4 - 15.2 seconds   INR 1.2 0.8 - 1.2    Comment: (NOTE) INR goal varies based on device and disease states. Performed at [facility], [place] [address], [place],  [address]      Blood pressure 112/81, pulse 64, temperature 98.3 F (36.8 C), temperature source Oral, resp. rate 15, SpO2 96 %. Patient is awake and alert oriented to person place time and circumstance aware of his needed surgery and very pleasant in conversation. Neck no complaints upper extremities unremarkable no back pain left lower extremity unremarkable right lower extremity slightly shortened mildly externally rotated ice packs on right groin and hip mild swelling about right hip proximal thigh and groin region.  Thigh knee leg foot ankle distal unremarkable.  Right foot circulation intact normal dorsiflexion plantarflexion and sensation.  Assessment/Plan: Very pleasant 83 year old Caucasian female with syncopal induced fall and subsequent right subcapital femur fracture by report.  No x-rays available at this time to review they have been ordered.  Cardiology clearance has been ordered by Dr. Duayne Cal is held heparin started.  Would recommend strict bedrest but may turn side to side for skin checks.  Will need surgery for right hip and it will be determined what type when x-rays are available.  I will contact a partner discussed situation and make further plans based upon that.  For now I asked Dr. Karleen Hampshire to make her n.p.o. after midnight in  preparation for possible surgery tomorrow but that may change and if so we will make appropriate modifications to her treatment.  All questions were encouraged and answered with patient.  Relayed more information later after x-rays reviewed and definitive plan decided.  Thank you for allowing [place] to see Mrs. Quentin Ore. Erykah Lippert ANDREW 12/16/2018, 10:36 AM

## 2018-12-16 NOTE — H&P (Signed)
History and Physical    Cynthia Frank [id number] DOB: [birth year] DOA: 12/16/2018  PCP: [name], MD  Patient coming from: transfer from [facility].  I have personally briefly reviewed patient's old medical records in [place]  Chief Complaint: syncope and right hip pain.   HPI: Cynthia Frank is a 83 y.o. female with medical history significant of non ischemic cardiomyopathy, s/p ICD, chronic systolic heart failure,  CAD, atrial fibrillation, hypertension, hyperlipidemia,  GERD, was at her beach house in [place] and walking in the house , passed out and fell on her right side. Her husband witnessed the fall. She lost consciousness for a few seconds and started having pain in the right leg and right hip.  She was seen at the local ED [facility], was found to have proximal hip fracture,. She requested to be transferred to [facility] hospital for hip repair.  She currently denies any chest pain, sob, or cough, fever or chills. No nausea, vomiting or abdominal pain or diarrhea.  Pt reports she had these type of syncopal episodes before and this is the 5 th time she passed out in the last one year.  She denies any dizziness, or blurry vision or headache, dysuria, or malena.   x ray of the right hip with pelvis shows right sub capital hip fracture. She was admitted to medical service and [facility] consulted for evaluation of right hip fracture.   Review of Systems: As per HPI "All others reviewed and are negative,"  Past Medical History:  Diagnosis Date  . AICD (automatic cardioverter/defibrillator) present   . Arthritis    "left ankle" (07/14/2015)  . Atrial fibrillation ([place])   . CHF (congestive heart failure) ([place])   . Colon cancer ([place]) 03/2006   T3, N0  . Colon polyps 12/07/2010  . Coronary artery disease    nonobstructive with 30% D1  . GERD (gastroesophageal reflux disease)   . History of ovarian cancer 1985  . Hx: UTI (urinary  tract infection)   . Hyperlipidemia   . Hypertension   . Internal hemorrhoid   . Nonischemic cardiomyopathy ([place])    last EF assessment 40% by MUGA  . Partial bowel obstruction ([place])   . PVC (premature ventricular contraction)     Past Surgical History:  Procedure Laterality Date  . ABDOMINAL HYSTERECTOMY  1979  . ANKLE CLOSED REDUCTION  10/16/2011   Procedure: CLOSED REDUCTION ANKLE;  Surgeon: Tobi Bastos, MD;  [facility];  Service: Orthopedics;  Laterality: Left;  . COLECTOMY  2007   for colon cancer  . COLONOSCOPY N/A 03/11/2013   Procedure: COLONOSCOPY;  Surgeon: Ladene Artist, MD;  [facility];  Service: Endoscopy;  Laterality: N/A;  . COLONOSCOPY WITH PROPOFOL N/A 04/21/2015   Procedure: COLONOSCOPY WITH PROPOFOL;  Surgeon: Ladene Artist, MD;  [facility];  Service: Endoscopy;  Laterality: N/A;  . COLONOSCOPY WITH PROPOFOL N/A 02/05/2018   Procedure: COLONOSCOPY WITH PROPOFOL Hemostasis Clip;  Surgeon: Ladene Artist, MD;  [facility];  Service: Endoscopy;  Laterality: N/A;  . EP IMPLANTABLE DEVICE N/A 07/14/2015   Procedure: BiV ICD Insertion CRT-D;  Surgeon: Evans Lance, MD;  Location: [facility];  Service: Cardiovascular;  Laterality: N/A;  . ESOPHAGOGASTRODUODENOSCOPY (EGD) WITH PROPOFOL N/A 04/21/2015   Procedure: ESOPHAGOGASTRODUODENOSCOPY (EGD) WITH PROPOFOL;  Surgeon: Ladene Artist, MD;  [facility];  Service: Endoscopy;  Laterality: N/A;  . FRACTURE SURGERY    .  INSERTION OF ICD Left 07/14/2015  . LAPAROTOMY  1980   "adhesions"  . LAPAROTOMY  1989   laparotomy for takedown of intestinal obstruction secondary to adhesions   . OOPHORECTOMY  1961  . OVARY SURGERY Right 1985   resection of right ovarian cancer  . POLYPECTOMY  02/05/2018   Procedure: POLYPECTOMY;  Surgeon: Ladene Artist, MD;  [facility];  Service: Endoscopy;;  . SMALL INTESTINE SURGERY  1985  . TONSILLECTOMY  1944    Social history.   reports that she quit smoking about 40 years ago. Her smoking use included cigarettes. She has a 10.00 pack-year smoking history. She has never used smokeless tobacco. She reports current alcohol use of about 14.0 standard drinks of alcohol per week. She reports that she does not use drugs.  Allergies  Allergen Reactions  . Clindamycin/Lincomycin     itching  . Dramamine Ii [Meclizine Hcl]   . Pneumococcal Vaccines Itching and Swelling    Redness   . Sulfonamide Derivatives Itching and Swelling    Family History  Problem Relation Age of Onset  . Uterine cancer Mother   . Prostate cancer Father   . Heart disease Father   . Heart attack Father   . Colon cancer Neg Hx    Family history reviewed and not pertinent.   Prior to Admission medications   Medication Sig Start Date End Date Taking? Authorizing Provider  amiodarone (PACERONE) 200 MG tablet TAKE 1 TABLET BY MOUTH DAILY Patient taking differently: Take 200 mg by mouth daily.  10/29/18  Yes Larey Dresser, MD  apixaban (ELIQUIS) 5 MG TABS tablet Take 1 tablet (5 mg total) by mouth 2 (two) times daily. 07/19/18  Yes Turner, Eber Hong, MD  atorvastatin (LIPITOR) 40 MG tablet TAKE 1 TABLET BY MOUTH EVERY DAY Patient taking differently: Take 40 mg by mouth daily at 6 PM.  07/20/18  Yes Evans Lance, MD  citalopram (CELEXA) 20 MG tablet TAKE 1 TABLET(20 MG) BY MOUTH DAILY Patient taking differently: Take 20 mg by mouth daily.  10/15/18  Yes [name], MD  Coenzyme Q10 (COQ10) 100 MG CAPS Take 100 mg by mouth daily.    Yes [provider]  conjugated estrogens (PREMARIN) vaginal cream Place 1 Applicatorful vaginally as needed (dyspareunia (Use as directed)). Reported on 05/11/2015   Yes [provider]  diclofenac sodium (VOLTAREN) 1 % GEL APPLY 2 GRAMS TOPICALLY 4 TIMES DAILY Patient taking differently: Apply 2 g topically 4 (four) times daily.  10/16/17  Yes [name], MD   ENTRESTO 24-26 MG TAKE 1 TABLET BY MOUTH TWICE DAILY 07/16/18  Yes Larey Dresser, MD  furosemide (LASIX) 20 MG tablet TAKE 2 TABLETS BY MOUTH IN THE MORNING AND 1 TABLET IN THE EVENING 10/03/18  Yes Larey Dresser, MD  levothyroxine (SYNTHROID) 25 MCG tablet TAKE 1 TABLET(25 MCG) BY MOUTH DAILY BEFORE BREAKFAST Patient taking differently: Take 25 mcg by mouth daily before breakfast.  10/23/18  Yes Larey Dresser, MD  Magnesium Oxide 500 MG CAPS Take 2,000 mg by mouth daily.   Yes [provider]  metoprolol succinate (TOPROL-XL) 25 MG 24 hr tablet Take 1 tablet (25 mg total) by mouth 2 (two) times daily. 11/17/17  Yes Larey Dresser, MD  spironolactone (ALDACTONE) 25 MG tablet TAKE 1 TABLET(25 MG) BY MOUTH EVERY EVENING Patient taking differently: Take 25 mg by mouth daily.  09/24/18  Yes Larey Dresser, MD  zolpidem Lorrin Mais)  5 MG tablet TAKE 1 TABLET BY MOUTH EVERY DAY AT BEDTIME AS NEEDED Patient taking differently: Take 5 mg by mouth at bedtime as needed for sleep.  01/08/18  Yes Marrian Salvage, FNP    Physical Exam: Vitals:   12/16/18 0554  BP: 112/81  Pulse: 64  Resp: 15  Temp: 98.3 F (36.8 C)  TempSrc: Oral  SpO2: 96%    Constitutional: NAD, calm, comfortable Vitals:   12/16/18 0554  BP: 112/81  Pulse: 64  Resp: 15  Temp: 98.3 F (36.8 C)  TempSrc: Oral  SpO2: 96%   Eyes: PERRL, lids and conjunctivae normal ENMT: Mucous membranes are moist. Posterior pharynx clear of any exudate or lesions.Normal dentition.  Neck: normal, supple, no masses, no thyromegaly Respiratory: clear to auscultation bilaterally, no wheezing, no crackles. Normal respiratory effort. No accessory muscle use.  Cardiovascular: Regular rate and rhythm, no murmurs  Abdomen: no tenderness, no masses palpated. No hepatosplenomegaly. Bowel sounds positive.  Musculoskeletal: right lower extremity externally rotated and tender at the right hip.  Skin: no rashes, lesions, ulcers. No  induration Neurologic: CN 2-12 grossly intact. Sensation intact, DTR normal. Strength 5/5 in all 4.  Psychiatric: Normal judgment and insight. Alert and oriented x 3. Normal mood.    Labs on Admission: I have personally reviewed following labs and imaging studies  CBC: Recent Labs  Lab 12/16/18 0727  WBC 8.0  NEUTROABS 6.2  HGB 12.4  HCT 37.4  MCV 96.1  PLT [id number]   Basic Metabolic Panel: Recent Labs  Lab 12/16/18 0727  NA 135  K 3.3*  CL 96*  CO2 27  GLUCOSE 102*  BUN 21  CREATININE 0.97  CALCIUM 8.7*   GFR: Estimated Creatinine Clearance: 41.6 mL/min (by C-G formula based on SCr of 0.97 mg/dL). Liver Function Tests: Recent Labs  Lab 12/16/18 0727  AST 71*  ALT 66*  ALKPHOS 56  BILITOT 1.3*  PROT 6.6  ALBUMIN 4.1   No results for input(s): LIPASE, AMYLASE in the last 168 hours. No results for input(s): AMMONIA in the last 168 hours. Coagulation Profile: Recent Labs  Lab 12/16/18 0727  INR 1.2   Cardiac Enzymes: No results for input(s): CKTOTAL, CKMB, CKMBINDEX, TROPONINI in the last 168 hours. BNP (last 3 results) No results for input(s): PROBNP in the last 8760 hours. HbA1C: No results for input(s): HGBA1C in the last 72 hours. CBG: No results for input(s): GLUCAP in the last 168 hours. Lipid Profile: No results for input(s): CHOL, HDL, LDLCALC, TRIG, CHOLHDL, LDLDIRECT in the last 72 hours. Thyroid Function Tests: No results for input(s): TSH, T4TOTAL, FREET4, T3FREE, THYROIDAB in the last 72 hours. Anemia Panel: No results for input(s): VITAMINB12, FOLATE, FERRITIN, TIBC, IRON, RETICCTPCT in the last 72 hours. Urine analysis:    Component Value Date/Time   COLORURINE YELLOW 11/14/2017 [place] 11/14/2017 1715   LABSPEC 1.015 11/14/2017 1715   PHURINE 6.0 11/14/2017 1715   GLUCOSEU NEGATIVE 11/14/2017 [place] 12/05/2011 0835   HGBUR NEGATIVE 11/14/2017 [place] 11/14/2017 1715    BILIRUBINUR negative 02/15/2012 [place] 11/14/2017 1715   PROTEINUR NEGATIVE 11/14/2017 1715   UROBILINOGEN 0.2 02/15/2012 1125   UROBILINOGEN 0.2 12/05/2011 0835   NITRITE NEGATIVE 11/14/2017 1715   LEUKOCYTESUR NEGATIVE 11/14/2017 1715    Radiological Exams on Admission: No results found.  EKG: Independently reviewed. Pending.   Assessment/Plan Active Problems:   Closed right hip fracture (HCC)  Right subcapital hip fracture following  Syncope; Admit to telemetry, Orthopedics consulted.  NPO after midnight.  Pain control.  Will call cardiology for pre op clearance .    H/o Non ischemic cardiomyopathy/ chronic systolic heart failure: She appears compensated.   Hold lasix and spironolactone,  but continue with Entresto.  Last Echo from [id number]  shows Systolic function was moderately to severely reduced. The   estimated ejection fraction was in the range of 30% to 35%. Diffuse hypokinesis. Doppler parameters are consistent with high ventricular filling pressure. She currently denies any chest pain or sob.    Paroxysmal atrial fibrillation;  S/p ICD placement in 06/2015. EKG shows paced rhythm . TSH two weeks ago wnl. Continue with amiodarone and toprol.  Her ICD will need to be interrogated.  eliquis on hold. Will start her on IV heparin.     Hypothyroidism:  Resume synthroid.    Hypertension:  Well controlled.  Resume entresto.   Hypokalemia: replaced.    Hyperlipidemia:  Resume lipitor.    GERD; Stable.    Severity of Illness: The appropriate patient status for this patient is INPATIENT. Inpatient status is judged to be reasonable and necessary in order to provide the required intensity of service to ensure the patient's safety. The patient's presenting symptoms, physical exam findings, and initial radiographic and laboratory data in the context of their chronic comorbidities is felt to place them at high risk for further clinical  deterioration. Furthermore, it is not anticipated that the patient will be medically stable for discharge from the hospital within 2 midnights of admission. The following factors support the patient status of inpatient.   " The patient's presenting symptoms include right hip pain, syncope. " The worrisome physical exam findings include right hip pain.  " The initial radiographic and laboratory data are worrisome because of sub capital hip fracture " The chronic co-morbidities include NICM, chronic systolic heart failure, PAF.S/P ICD.    * I certify that at the point of admission it is my clinical judgment that the patient will require inpatient hospital care spanning beyond 2 midnights from the point of admission due to high intensity of service, high risk for further deterioration and high frequency of surveillance required.*    DVT prophylaxis: heparin Code Status: full code.  Family Communication: none at bedside. Called husband and left message.  Disposition Plan: pending further work by [employer].  Consults called: orthopedics [facility] and cardiology.  Admission status: inpateint/tele   Hosie Poisson MD [facility] Pager [phone]   If 7PM-7AM, please contact night-coverage [email or web address] Password [email or web address]  12/16/2018, 9:12 AM

## 2018-12-16 NOTE — Consult Note (Signed)
CARDIOLOGY CONSULT NOTE     Primary Care Physician: [name], MD Referring Physician:  Dr Karleen Hampshire  Admit Date: 12/16/2018    Reason for consultation:  Syncope, preop clearance  Cynthia Frank is a 83 y.o. female with a h/o nonischemic CM, chronic systolic dysfunction, and atrial fibrillation.  She is s/p BiV ICD implantation by Dr Lovena Le in 2017. Her EF did not improve.  She has been followed by Dr Aundra Dubin for CHF.  She reports frequent episodes of symptomatic hypotension.  She has had 4 episode of syncope which she attributes to low BP.  Her last episode occurred in February.  Upon falling 11/12/17, she had a transverse process fracture of C5.    She now presents after having syncope (transient) with fall and R hip fracture.  She reports that she was at [place] in [place].  She was walking (had been up for about 4 minutes) when she collapsed with brief LOC.  After several seconds she woke.  She sustained hip fracture.  She was brought to [facility] for further evaluation. Today, she denies symptoms of palpitations, chest pain, shortness of breath, orthopnea, PND, lower extremity edema, ICD shocks or neurologic sequela. The patient is tolerating medications without difficulties and is otherwise without complaint today.   Past Medical History:  Diagnosis Date  . AICD (automatic cardioverter/defibrillator) present   . Arthritis    "left ankle" (07/14/2015)  . Atrial fibrillation ([place])   . CHF (congestive heart failure) ([place])   . Colon cancer ([place]) 03/2006   T3, N0  . Colon polyps 12/07/2010  . Coronary artery disease    nonobstructive with 30% D1  . GERD (gastroesophageal reflux disease)   . History of ovarian cancer 1985  . Hx: UTI (urinary tract infection)   . Hyperlipidemia   . Hypertension   . Internal hemorrhoid   . Nonischemic cardiomyopathy ([place])    last EF assessment 40% by MUGA  . Partial bowel obstruction ([place])   . PVC (premature ventricular contraction)     Past Surgical History:  Procedure Laterality Date  . ABDOMINAL HYSTERECTOMY  1979  . ANKLE CLOSED REDUCTION  10/16/2011   Procedure: CLOSED REDUCTION ANKLE;  Surgeon: Tobi Bastos, MD;  Location: [facility];  Service: Orthopedics;  Laterality: Left;  . COLECTOMY  2007   for colon cancer  . COLONOSCOPY N/A 03/11/2013   Procedure: COLONOSCOPY;  Surgeon: Ladene Artist, MD;  Location: [facility];  Service: Endoscopy;  Laterality: N/A;  . COLONOSCOPY WITH PROPOFOL N/A 04/21/2015   Procedure: COLONOSCOPY WITH PROPOFOL;  Surgeon: Ladene Artist, MD;  Location: [facility];  Service: Endoscopy;  Laterality: N/A;  . COLONOSCOPY WITH PROPOFOL N/A 02/05/2018   Procedure: COLONOSCOPY WITH PROPOFOL Hemostasis Clip;  Surgeon: Ladene Artist, MD;  Location: [facility];  Service: Endoscopy;  Laterality: N/A;  . EP IMPLANTABLE DEVICE N/A 07/14/2015   Procedure: BiV ICD Insertion CRT-D;  Surgeon: Evans Lance, MD;  Location: [facility];  Service: Cardiovascular;  Laterality: N/A;  . ESOPHAGOGASTRODUODENOSCOPY (EGD) WITH PROPOFOL N/A 04/21/2015   Procedure: ESOPHAGOGASTRODUODENOSCOPY (EGD) WITH PROPOFOL;  Surgeon: Ladene Artist, MD;  Location: [facility];  Service: Endoscopy;  Laterality: N/A;  . FRACTURE SURGERY    . INSERTION OF ICD Left 07/14/2015  . LAPAROTOMY  1980   "adhesions"  . LAPAROTOMY  1989   laparotomy for takedown of intestinal obstruction secondary to adhesions   . OOPHORECTOMY  1961  . OVARY SURGERY Right  1985   resection of right ovarian cancer  . POLYPECTOMY  02/05/2018   Procedure: POLYPECTOMY;  Surgeon: Ladene Artist, MD;  Location: [facility];  Service: Endoscopy;;  . SMALL INTESTINE SURGERY  1985  . TONSILLECTOMY  1944    . acetaminophen  500 mg Oral Q6H  . amiodarone  200 mg Oral Daily  . atorvastatin  40 mg Oral Daily  . citalopram  20 mg Oral Daily  . [START ON 12/17/2018] levothyroxine  25 mcg Oral QAC breakfast  . metoprolol succinate  25 mg Oral  BID  . sacubitril-valsartan  1 tablet Oral BID   . sodium chloride 75 mL/hr at 12/16/18 1629  . heparin 900 Units/hr (12/16/18 1225)  . sodium chloride Stopped (12/16/18 1628)    Allergies  Allergen Reactions  . Clindamycin/Lincomycin     itching  . Dramamine Ii [Meclizine Hcl]   . Pneumococcal Vaccines Itching and Swelling    Redness   . Sulfonamide Derivatives Itching and Swelling    Social History   Socioeconomic History  . Marital status: Married    Spouse name: Not on file  . Number of children: 2  . Years of education: Not on file  . Highest education level: Not on file  Occupational History  . Occupation: retired  Scientific laboratory technician  . Financial resource strain: Not on file  . Food insecurity    Worry: Not on file    Inability: Not on file  . Transportation needs    Medical: Not on file    Non-medical: Not on file  Tobacco Use  . Smoking status: Former Smoker    Packs/day: 0.50    Years: 20.00    Pack years: 10.00    Types: Cigarettes    Quit date: 04/18/1978    Years since quitting: 40.6  . Smokeless tobacco: Never Used  Substance and Sexual Activity  . Alcohol use: Yes    Alcohol/week: 14.0 standard drinks    Types: 14 Glasses of wine per week    Comment: has wine before dinner   . Drug use: No  . Sexual activity: Not Currently  Lifestyle  . Physical activity    Days per week: Not on file    Minutes per session: Not on file  . Stress: Not on file  Relationships  . Social Herbalist on phone: Not on file    Gets together: Not on file    Attends religious service: Not on file    Active member of club or organization: Not on file    Attends meetings of clubs or organizations: Not on file    Relationship status: Not on file  . Intimate partner violence    Fear of current or ex partner: Not on file    Emotionally abused: Not on file    Physically abused: Not on file    Forced sexual activity: Not on file  Other Topics Concern  . Not on  file  Social History Narrative   Patient had never smoked.   Alcohol use- yes   Daily caffeine use- coffee   Illicit drug use- no   Occupation:retired     Family History  Problem Relation Age of Onset  . Uterine cancer Mother   . Prostate cancer Father   . Heart disease Father   . Heart attack Father   . Colon cancer Neg Hx     ROS- All systems are reviewed and negative except as per the HPI above  Physical Exam: Telemetry: Vitals:   12/16/18 0554 12/16/18 1303  BP: 112/81 (!) 87/54  Pulse: 64 61  Resp: 15 16  Temp: 98.3 F (36.8 C) 98 F (36.7 C)  TempSrc: Oral Oral  SpO2: 96% 96%    GEN- The patient is well appearing, alert and oriented x 3 today.   Head- normocephalic, atraumatic Eyes-  Sclera clear, conjunctiva pink Ears- hearing intact Oropharynx- clear Neck- supple, no JVP Lungs-   normal work of breathing Heart- Regular rate and rhythm  GI- soft, NT, ND, + BS Extremities- no clubbing, cyanosis, or edema MS- no significant deformity or atrophy Skin- no rash or lesion Psych- euthymic mood, full affect Neuro- strength and sensation are intact  EKG:  AV paced  Labs:   Lab Results  Component Value Date   WBC 8.0 12/16/2018   HGB 12.4 12/16/2018   HCT 37.4 12/16/2018   MCV 96.1 12/16/2018   PLT 130 (L) 12/16/2018    Recent Labs  Lab 12/16/18 0727  NA 135  K 3.3*  CL 96*  CO2 27  BUN 21  CREATININE 0.97  CALCIUM 8.7*  PROT 6.6  BILITOT 1.3*  ALKPHOS 56  ALT 66*  AST 71*  GLUCOSE 102*   Lab Results  Component Value Date   CHOLHDL 1.7 05/02/2018   CHOLHDL 1.8 05/31/2016   CHOLHDL 2 03/24/2015   Radiology: prior CXRs are reviewed and reveal good BiV ICD lead placement  Echo:  EF chronically 35%  ASSESSMENT AND PLAN:   1. preop The patient is at increased risk for surgery given reduced EF however, this is not reversible and not likely to improve with further CV procedures/ management. Proceed with surgery if medically indicated  Will need to have [employer] representative turn ICD therapies off and check the device prior to surgery.  2. Paroxysmal atrial fibrillation chads2vasc score is at least 5.  She is chronically on eliquis Ok to hold eliquis for surgery.  She does not require IV heparin bridge perioperatively.  Resume eliquis when able post operatively  3. Chronic systolic dysfunction/ nonischemic CM clinically euvolemic at this time We may need to back off on her medicines on discharge given 5 episodes of syncope previously attributed to hypotension. I will also have her seen in our device clinic for device optimization of CRT once healed from her surgery.  She will also be enrolled in [facility] device clinic. Keep Is and Os about even perioperatively  4. Syncope Likely due to hypotension She appears a little dry We will check her BiV ICD function prior to surgery and also assess for any recent arrhythmias on the device.  Cardiology to follow with you while here.  Thompson Grayer, MD 12/16/2018  4:31 PM

## 2018-12-16 NOTE — Progress Notes (Signed)
  Echocardiogram 2D Echocardiogram has been performed.  Bobbye Charleston 12/16/2018, 2:52 PM

## 2018-12-16 NOTE — Progress Notes (Signed)
ANTICOAGULATION CONSULT NOTE - Initial Consult  Pharmacy Consult for heparin Indication: atrial fibrillation  Allergies  Allergen Reactions  . Clindamycin/Lincomycin     itching  . Dramamine Ii [Meclizine Hcl]   . Pneumococcal Vaccines Itching and Swelling    Redness   . Sulfonamide Derivatives Itching and Swelling    Patient Measurements:   Heparin Dosing Weight: 64kg  Vital Signs: Temp: 98.3 F (36.8 C) (08/30 0554) Temp Source: Oral (08/30 0554) BP: 112/81 (08/30 0554) Pulse Rate: 64 (08/30 0554)  Labs: Recent Labs    12/16/18 0727  HGB 12.4  HCT 37.4  PLT 130*  LABPROT 14.9  INR 1.2  CREATININE 0.97    Estimated Creatinine Clearance: 41.6 mL/min (by C-G formula based on SCr of 0.97 mg/dL).   Medical History: Past Medical History:  Diagnosis Date  . AICD (automatic cardioverter/defibrillator) present   . Arthritis    "left ankle" (07/14/2015)  . Atrial fibrillation ([place])   . CHF (congestive heart failure) ([place])   . Colon cancer ([place]) 03/2006   T3, N0  . Colon polyps 12/07/2010  . Coronary artery disease    nonobstructive with 30% D1  . GERD (gastroesophageal reflux disease)   . History of ovarian cancer 1985  . Hx: UTI (urinary tract infection)   . Hyperlipidemia   . Hypertension   . Internal hemorrhoid   . Nonischemic cardiomyopathy ([place])    last EF assessment 40% by MUGA  . Partial bowel obstruction ([place])   . PVC (premature ventricular contraction)     Assessment: 15 yoF admitted with R hip fracture after mechanical fall. Pt on apixaban PTA for hx of PAF, last dose unknown but within past week. Pharmacy asked to transition to heparin with possible need for orthopedic repair. Will defer bolus with fall and unclear timing of last apixaban dose.  Goal of Therapy:  Heparin level 0.3-0.7 units/ml aPTT 66-102 seconds Monitor platelets by anticoagulation protocol: Yes   Plan:  -Heparin 900 units/hr with no bolus -Check 8hr aPTT and heparin  level -Daily aPTT, heparin level, CBC -Monitor S/Sx bleeding   Arrie Senate, PharmD, BCPS Clinical Pharmacist Please check AMION for all [place] numbers 12/16/2018

## 2018-12-16 NOTE — Progress Notes (Signed)
ANTICOAGULATION CONSULT NOTE - Initial Consult  Pharmacy Consult for heparin Indication: atrial fibrillation  Allergies  Allergen Reactions  . Clindamycin/Lincomycin     itching  . Dramamine Ii [Meclizine Hcl]   . Pneumococcal Vaccines Itching and Swelling    Redness   . Sulfonamide Derivatives Itching and Swelling    Patient Measurements:   Heparin Dosing Weight: 64kg  Vital Signs: Temp: 98 F (36.7 C) (08/30 1303) Temp Source: Oral (08/30 1303) BP: 98/61 (08/30 1806) Pulse Rate: 61 (08/30 1806)  Labs: Recent Labs    12/16/18 0727 12/16/18 1824  HGB 12.4  --   HCT 37.4  --   PLT 130*  --   APTT  --  51*  LABPROT 14.9  --   INR 1.2  --   HEPARINUNFRC  --  2.08*  CREATININE 0.97  --     Estimated Creatinine Clearance: 41.6 mL/min (by C-G formula based on SCr of 0.97 mg/dL).   Medical History: Past Medical History:  Diagnosis Date  . AICD (automatic cardioverter/defibrillator) present   . Arthritis    "left ankle" (07/14/2015)  . Atrial fibrillation ([place])   . CHF (congestive heart failure) ([place])   . Colon cancer ([place]) 03/2006   T3, N0  . Colon polyps 12/07/2010  . Coronary artery disease    nonobstructive with 30% D1  . GERD (gastroesophageal reflux disease)   . History of ovarian cancer 1985  . Hx: UTI (urinary tract infection)   . Hyperlipidemia   . Hypertension   . Internal hemorrhoid   . Nonischemic cardiomyopathy ([place])    last EF assessment 40% by MUGA  . Partial bowel obstruction ([place])   . PVC (premature ventricular contraction)     Assessment: 27 yoF admitted with R hip fracture after mechanical fall. Pt on apixaban PTA for hx of PAF, last dose unknown but within past week. Pharmacy asked to transition to heparin with possible need for orthopedic repair. Will defer bolus with fall and unclear timing of last apixaban dose.  2nd shift update: HL @ 18:24 = 2.08 (falsely elevated due to lingering effects of apixaban) APTT @ 18:24 = 51 sec  with heparin infusing @ 900 units/hr No complications of therapy noted  Goal of Therapy:  Heparin level 0.3-0.7 units/ml aPTT 66-102 seconds Monitor platelets by anticoagulation protocol: Yes   Plan:  -Increase Heparin to 1050 units/hr  -Check 8hr aPTT and heparin level -Daily aPTT, heparin level, CBC -Monitor S/Sx bleeding   Leone Haven, PharmD 12/16/2018

## 2018-12-17 ENCOUNTER — Encounter (HOSPITAL_COMMUNITY): Payer: Self-pay | Admitting: Cardiology

## 2018-12-17 DIAGNOSIS — E876 Hypokalemia: Secondary | ICD-10-CM

## 2018-12-17 DIAGNOSIS — R55 Syncope and collapse: Secondary | ICD-10-CM

## 2018-12-17 DIAGNOSIS — E7849 Other hyperlipidemia: Secondary | ICD-10-CM

## 2018-12-17 LAB — BASIC METABOLIC PANEL
Anion gap: 8 (ref 5–15)
BUN: 20 mg/dL (ref 8–23)
CO2: 22 mmol/L (ref 22–32)
Calcium: 8.2 mg/dL — ABNORMAL LOW (ref 8.9–10.3)
Chloride: 103 mmol/L (ref 98–111)
Creatinine, Ser: 1.12 mg/dL — ABNORMAL HIGH (ref 0.44–1.00)
GFR calc Af Amer: 54 mL/min — ABNORMAL LOW (ref >60)
GFR calc non Af Amer: 46 mL/min — ABNORMAL LOW (ref >60)
Glucose, Bld: 104 mg/dL — ABNORMAL HIGH (ref 70–99)
Potassium: 3.9 mmol/L (ref 3.5–5.1)
Sodium: 133 mmol/L — ABNORMAL LOW (ref 135–145)

## 2018-12-17 LAB — CBC
HCT: 33.6 % — ABNORMAL LOW (ref 36.0–46.0)
Hemoglobin: 11 g/dL — ABNORMAL LOW (ref 12.0–15.0)
MCH: 31.8 pg (ref 26.0–34.0)
MCHC: 32.7 g/dL (ref 30.0–36.0)
MCV: 97.1 fL (ref 80.0–100.0)
Platelets: 108 10*3/uL — ABNORMAL LOW (ref 150–400)
RBC: 3.46 MIL/uL — ABNORMAL LOW (ref 3.87–5.11)
RDW: 13.2 % (ref 11.5–15.5)
WBC: 7.3 10*3/uL (ref 4.0–10.5)
nRBC: 0 % (ref 0.0–0.2)

## 2018-12-17 LAB — HEPARIN LEVEL (UNFRACTIONATED): Heparin Unfractionated: 1.9 IU/mL — ABNORMAL HIGH (ref 0.30–0.70)

## 2018-12-17 LAB — SURGICAL PCR SCREEN
MRSA, PCR: NEGATIVE
Staphylococcus aureus: NEGATIVE

## 2018-12-17 LAB — APTT: aPTT: 138 seconds — ABNORMAL HIGH (ref 24–36)

## 2018-12-17 LAB — SARS CORONAVIRUS 2 BY RT PCR (HOSPITAL ORDER, PERFORMED IN ~~LOC~~ HOSPITAL LAB): SARS Coronavirus 2: NEGATIVE

## 2018-12-17 LAB — GLUCOSE, CAPILLARY: Glucose-Capillary: 106 mg/dL — ABNORMAL HIGH (ref 70–99)

## 2018-12-17 MED ORDER — POVIDONE-IODINE 10 % EX SWAB
2.0000 "application " | Freq: Once | CUTANEOUS | Status: AC
  Administered 2018-12-18: 2 via TOPICAL

## 2018-12-17 MED ORDER — TRANEXAMIC ACID-NACL 1000-0.7 MG/100ML-% IV SOLN
1000.0000 mg | INTRAVENOUS | Status: DC
  Filled 2018-12-17: qty 100

## 2018-12-17 MED ORDER — CEFAZOLIN SODIUM-DEXTROSE 2-4 GM/100ML-% IV SOLN
2.0000 g | INTRAVENOUS | Status: DC
  Filled 2018-12-17: qty 100

## 2018-12-17 MED ORDER — DEXTROSE 5 % IV SOLN
3.0000 g | INTRAVENOUS | Status: DC
  Filled 2018-12-17: qty 3000

## 2018-12-17 MED ORDER — MAGNESIUM OXIDE 400 (241.3 MG) MG PO TABS
400.0000 mg | ORAL_TABLET | Freq: Two times a day (BID) | ORAL | Status: DC
  Administered 2018-12-17 – 2018-12-21 (×9): 400 mg via ORAL
  Filled 2018-12-17 (×9): qty 1

## 2018-12-17 MED ORDER — ENSURE PRE-SURGERY PO LIQD
296.0000 mL | Freq: Once | ORAL | Status: AC
  Administered 2018-12-18: 296 mL via ORAL
  Filled 2018-12-17 (×4): qty 296

## 2018-12-17 MED ORDER — CHLORHEXIDINE GLUCONATE 4 % EX LIQD
60.0000 mL | Freq: Once | CUTANEOUS | Status: AC
  Administered 2018-12-17: 4 via TOPICAL
  Filled 2018-12-17: qty 60

## 2018-12-17 NOTE — Plan of Care (Signed)
  Problem: Education: Goal: Verbalization of understanding the information provided (i.e., activity precautions, restrictions, etc) will improve Outcome: Progressing   Problem: Pain Management: Goal: Pain level will decrease Outcome: Progressing

## 2018-12-17 NOTE — Progress Notes (Addendum)
Progress Note  Patient Name: Cynthia Frank Date of Encounter: 12/17/2018  Primary Cardiologist: No primary care provider on file. Dr. Aundra Dubin EP : Dr. Lovena Le   Subjective   No chest pain or SOB for surgery around 1700  Inpatient Medications    Scheduled Meds: . acetaminophen  500 mg Oral Q6H  . amiodarone  200 mg Oral Daily  . atorvastatin  40 mg Oral Daily  . chlorhexidine  60 mL Topical Once  . citalopram  20 mg Oral Daily  . feeding supplement  296 mL Oral Once  . levothyroxine  25 mcg Oral QAC breakfast  . metoprolol succinate  25 mg Oral BID  . povidone-iodine  2 application Topical Once   Continuous Infusions: . sodium chloride 75 mL/hr at 12/17/18 0043  .  ceFAZolin (ANCEF) IV    . sodium chloride Stopped (12/16/18 1628)  . tranexamic acid     PRN Meds: ketorolac, ondansetron (ZOFRAN) IV, zolpidem   Vital Signs    Vitals:   12/16/18 2041 12/16/18 2106 12/16/18 2229 12/17/18 0501  BP: 92/62 (!) 85/57 (!) 136/97 103/64  Pulse: 64 60 97 61  Resp: 18  20 16   Temp: 98.8 F (37.1 C)  98.8 F (37.1 C) 98.8 F (37.1 C)  TempSrc: Oral  Oral Oral  SpO2: 94%  97% 93%  Weight:      Height:       No intake or output data in the 24 hours ending 12/17/18 0809 Last 3 Weights 12/16/2018 12/10/2018 11/30/2018  Weight (lbs) 141 lb 1.5 oz 141 lb 142 lb 6.4 oz  Weight (kg) 64 kg 63.957 kg 64.592 kg      Telemetry    AV paced - Personally Reviewed  ECG    No new - Personally Reviewed  Physical Exam   GEN: No acute distress.   Neck: No JVD Cardiac: RRR, no murmurs, rubs, or gallops.  Respiratory: Clear to auscultation bilaterally. GI: Soft, nontender, non-distended  MS: No edema; No deformity. Neuro:  Nonfocal  Psych: Normal affect   Labs    High Sensitivity Troponin:  No results for input(s): TROPONINIHS in the last 720 hours.    Chemistry Recent Labs  Lab 12/16/18 0727  NA 135  K 3.3*  CL 96*  CO2 27  GLUCOSE 102*  BUN 21  CREATININE 0.97   CALCIUM 8.7*  PROT 6.6  ALBUMIN 4.1  AST 71*  ALT 66*  ALKPHOS 56  BILITOT 1.3*  GFRNONAA 55*  GFRAA >60  ANIONGAP 12     Hematology Recent Labs  Lab 12/16/18 0727 12/17/18 0437  WBC 8.0 7.3  RBC 3.89 3.46*  HGB 12.4 11.0*  HCT 37.4 33.6*  MCV 96.1 97.1  MCH 31.9 31.8  MCHC 33.2 32.7  RDW 13.1 13.2  PLT 130* 108*    BNPNo results for input(s): BNP, PROBNP in the last 168 hours.   DDimer No results for input(s): DDIMER in the last 168 hours.   Radiology    Dg Knee Right Port  Result Date: 12/16/2018 CLINICAL DATA:  Pain after fall 1 day ago. EXAM: PORTABLE RIGHT KNEE - 1-2 VIEW COMPARISON:  None. FINDINGS: Chondrocalcinosis. Vascular calcifications. No fractures, dislocations, or effusions. IMPRESSION: No acute abnormalities.  Chondrocalcinosis consistent with CPPD. Electronically Signed   By: Dorise Bullion III M.D   On: 12/16/2018 12:40    Cardiac Studies   Echo 12/16/18 Sonographer Comments: Suboptimal apical window. Restricted mobility. Patient has broken hip could not turn.  IMPRESSIONS    1. The left ventricle has mildly reduced systolic function, with an ejection fraction of 45-50%. The cavity size was normal. Left ventricular diastolic Doppler parameters are indeterminate. No evidence of left ventricular regional wall motion  abnormalities.  2. The right ventricle has normal systolic function. The cavity was normal. There is no increase in right ventricular wall thickness. Right ventricular systolic pressure is moderately elevated.  3. Left atrial size was mildly dilated.  4. There is mild mitral annular calcification present.  5. Tricuspid valve regurgitation is mild-moderate.  6. The aorta is normal unless otherwise noted.  7. Ventricular pacing noted. No visible A wave on mitral inflow or on mitral annulus tissue Doppler suggests atrial fibrillation at the time of the study (or atrial mechanical failure). This limits the accuracy of diastolic function  assessment.  8. When compared to the prior study: 12/16/2015, there is improved left ventricular systolic function due to improved lateral-septal synchrony by CRT pacing. Despite this, there is worsened pulmonary artery hypertension.  FINDINGS  Left Ventricle: The left ventricle has mildly reduced systolic function, with an ejection fraction of 45-50%. The cavity size was normal. There is no increase in left ventricular wall thickness. Left ventricular diastolic Doppler parameters are  indeterminate. No evidence of left ventricular regional wall motion abnormalities..  Right Ventricle: The right ventricle has normal systolic function. The cavity was normal. There is no increase in right ventricular wall thickness. Right ventricular systolic pressure is moderately elevated. Pacing wire/catheter visualized in the right  ventricle.  Left Atrium: Left atrial size was mildly dilated.  Right Atrium: Right atrial size was normal in size.  Interatrial Septum: No atrial level shunt detected by color flow Doppler.  Pericardium: There is no evidence of pericardial effusion.  Mitral Valve: The mitral valve is normal in structure. There is mild mitral annular calcification present. Mitral valve regurgitation is mild by color flow Doppler.  Tricuspid Valve: The tricuspid valve is normal in structure. Tricuspid valve regurgitation is mild-moderate by color flow Doppler.  Aortic Valve: The aortic valve is normal in structure. Aortic valve regurgitation is trivial by color flow Doppler.  Pulmonic Valve: The pulmonic valve was normal in structure. Pulmonic valve regurgitation is not visualized by color flow Doppler.  Aorta: The aorta is normal unless otherwise noted.  Venous: The inferior vena cava is normal in size with greater than 50% respiratory variability.  Compared to previous exam: 12/16/2015, there is improved left ventricular systolic function due to improved lateral-septal  synchrony by CRT pacing. Despite this, there is worsened pulmonary artery hypertension.      Patient Profile     83 y.o. female with a h/o nonischemic CM, chronic systolic dysfunction, and atrial fibrillation.  She is s/p BiV ICD implantation by Dr Lovena Le in 2017. Her EF did improve.  She has been followed by Dr Aundra Dubin for CHF.  She reports frequent episodes of symptomatic hypotension.  She has had 4 episode of syncope which she attributes to low BP.  Her last episode occurred in February.  Upon falling 11/12/17, she had a transverse process fracture of C5.    Assessment & Plan    1. preop per Dr. Rayann Heman and instructions for eliquis The patient is at increased risk for surgery given reduced EF however, this is not reversible and not likely to improve with further CV procedures/ management. Proceed with surgery if medically indicated Will need to have [employer] representative turn ICD therapies off and check the device prior to  surgery.  2. Paroxysmal atrial fibrillation chads2vasc score is at least 5.  She is chronically on eliquis Ok to hold eliquis for surgery.  She does not require IV heparin bridge perioperatively.  Resume eliquis when able post operatively  3. Chronic systolic dysfunction/ nonischemic CM  euvolemic currently We may need to back off on her medicines on discharge given 5 episodes of syncope previously attributed to hypotension. Will hold entresto.  Will see post op to see how BP is running Dr. Rayann Heman felt she should be seen in our device clinic for device optimization of CRT once healed from her surgery.  She will also be enrolled in [facility] device clinic. Keep Is and Os about even perioperatively  4. Syncope Likely due to hypotension She appears a little dry We will check her [employer] BiV ICD function prior to surgery and also assess for any recent arrhythmias on the device. We will contact [employer]      For questions or updates, please contact [place]  Please consult [email or web address] for contact info under        Signed, Cecilie Kicks, NP  12/17/2018, 8:09 AM    Patient seen and examined.  Agree with above documentation.  Patient denies any chest pain or shortness of breath this morning.  She is alert and oriented, lungs are clear to auscultation bilaterally, no JVD, no lower extremity edema, regular rate and rhythm, no murmurs.  Telemetry personally reviewed, shows V paced rhythm with rate 60s to 80s, no events.  Echocardiogram shows improvement in ejection fraction, 45 to 50%, normal RV function, mild to moderate TR, IVC is small and collapsible.  Suspect her syncopal episode was due to hypotension.  We will hold her Entresto.  Contact [employer] for device interrogation and to disable antitachycardia therapies for her surgery.

## 2018-12-17 NOTE — Progress Notes (Signed)
Per OR charge RN, schedule is extremely full today and there is a lack of availability. Surgery rescheduled for tomorrow. OK to eat today. NPO after MN tomorrow. Hold anticoags for now.

## 2018-12-17 NOTE — Progress Notes (Signed)
ANTICOAGULATION CONSULT NOTE - Follow Up Consult  Pharmacy Consult for Heparin Indication: atrial fibrillation  Allergies  Allergen Reactions  . Clindamycin/Lincomycin     itching  . Dramamine Ii [Meclizine Hcl]   . Pneumococcal Vaccines Itching and Swelling    Redness   . Sulfonamide Derivatives Itching and Swelling    Patient Measurements: Height: 5\' 5"  (165.1 cm) Weight: 141 lb 1.5 oz (64 kg) IBW/kg (Calculated) : 57 Heparin Dosing Weight:   Vital Signs: Temp: 98.8 F (37.1 C) (08/31 0501) Temp Source: Oral (08/31 0501) BP: 103/64 (08/31 0501) Pulse Rate: 61 (08/31 0501)  Labs: Recent Labs    12/16/18 0727 12/16/18 1824 12/17/18 0437  HGB 12.4  --  11.0*  HCT 37.4  --  33.6*  PLT 130*  --  108*  APTT  --  51* 138*  LABPROT 14.9  --   --   INR 1.2  --   --   HEPARINUNFRC  --  2.08* 1.90*  CREATININE 0.97  --   --     Estimated Creatinine Clearance: 41.6 mL/min (by C-G formula based on SCr of 0.97 mg/dL).   Medications:  Infusions:  . sodium chloride 75 mL/hr at 12/17/18 0043  .  ceFAZolin (ANCEF) IV    .  ceFAZolin (ANCEF) IV    . heparin 1,050 Units/hr (12/17/18 [id number])  . sodium chloride Stopped (12/16/18 1628)  . tranexamic acid      Assessment: Patient with high heparin level and PTT.  PTT ordered with Heparin level until both correlate due to possible drug-lab interaction between oral anticoagulant (rivaroxaban, edoxaban, or apixaban) and anti-Xa level (aka heparin level) No heparin issues per RN.  Goal of Therapy:  Heparin level 0.3-0.7 units/ml aPTT 66-102 seconds Monitor platelets by anticoagulation protocol: Yes   Plan:  Decrease heparin to 950 units/hr Recheck level at [place], Fawne Hughley [place] 12/17/2018,6:48 AM

## 2018-12-17 NOTE — Progress Notes (Signed)
PROGRESS NOTE  Cynthia Frank [id number] DOB: 1936-02-24 DOA: 12/16/2018 PCP: [name], MD   LOS: 1 day   Brief narrative:  Cynthia Frank is a 83 y.o. female with medical history significant of non ischemic cardiomyopathy, s/p ICD, chronic systolic heart failure,  CAD, atrial fibrillation, hypertension, hyperlipidemia,  GERD, [place] and walking in the house , passed out and fell on her right side. Her husband witnessed the fall. She lost consciousness for a few seconds and started having pain in the right leg and right hip.  She was seen at the local ED [facility], was found to have proximal hip fracture,. She requested to be transferred to [facility] hospital for hip repair.  x ray of the right hip with pelvis shows right sub capital hip fracture. She was admitted to medical service and [facility] consulted for evaluation of right hip fracture.  Assessment/Plan:  Principal Problem:   Closed right hip fracture (HCC) Active Problems:   Essential hypertension   Atrial fibrillation (HCC)   Nonischemic cardiomyopathy (HCC)   Hyperlipidemia   GERD (gastroesophageal reflux disease)   Chronic systolic CHF (congestive heart failure) (HCC)   Hypokalemia  Closed right hip fracture status post syncope. Orthopedics on board.  Plan for surgical intervention likely by tomorrow.  Seen by cardiology and orthopedics..  Syncope, h/o Non ischemic cardiomyopathy/  Seen by cardiology.  Might need adjustment in her medication.  This is likely secondary to hypotension.  Repeat 2D echocardiogram from 12/16/2018 showed ejection fraction of 45 to 50%.  Interrogate AICD.  Chronic systolic heart failure: Appears compensated at this time.  Delene Loll has been continued.  Lasix and spironolactone on hold. Last Echo from [id number]  shows Systolic function was moderately to severely reduced in the range of 30% to 35%. Diffuse hypokinesis. Doppler  parameters are consistent with high ventricular filling pressure.  Denies any chest pain or dyspnea.  Paroxysmal atrial fibrillation; status post AICD 06/2015. Continue amiodarone and toprol.  EKG with paced rhythm. Her ICD will need to be interrogated.  eliquis on hold.  Heparin drip was initiated but will be kept on hold for surgery.  No need for bridging as per cardiology.  Will resume Eliquis after surgery when okay with orthopedics.  Hypothyroidism:  Continue Synthroid.   Essential hypertension: Controlled at this time.  On Entresto.  Resume Lasix and aspirin lactone after surgery depending on clinical course.  Hypokalemia: replaced.  Check BMP stat today.  Hyperlipidemia:  Resume lipitor.   GERD; Stable.    VTE Prophylaxis: Was on heparin drip, will discontinue for surgery.  Code Status: Full code  Family Communication: None today  Disposition Plan: To be determined.  Awaiting for surgical intervention.  Might need rehabilitation placement.   Consultants:  Orthopedics, cardiology  Procedures:  None so far  Antibiotics: Anti-infectives (From admission, onward)   Start     Dose/Rate Route Frequency Ordered Stop   12/17/18 0645  ceFAZolin (ANCEF) IVPB 2g/100 mL premix     2 g 200 mL/hr over 30 Minutes Intravenous On call to O.R. 12/17/18 [id number] 12/18/18 0559   12/17/18 0645  ceFAZolin (ANCEF) 3 g in dextrose 5 % 50 mL IVPB  Status:  Discontinued     3 g 100 mL/hr over 30 Minutes Intravenous On call to O.R. 12/17/18 [id number] 12/17/18 0756      Subjective: Patient denies any chest pain, shortness of breath, nausea, vomiting.  Complains of hip  pain on moving.  Objective: Vitals:   12/16/18 2229 12/17/18 0501  BP: (!) 136/97 103/64  Pulse: 97 61  Resp: 20 16  Temp: 98.8 F (37.1 C) 98.8 F (37.1 C)  SpO2: 97% 93%   No intake or output data in the 24 hours ending 12/17/18 1028 Filed Weights   12/16/18 2026  Weight: 64 kg   Body mass index is 23.48  kg/m.   Physical Exam: GENERAL: Patient is alert awake and oriented. Not in obvious distress. HENT: No scleral pallor or icterus. Pupils equally reactive to light. Oral mucosa is moist NECK: is supple, no palpable thyroid enlargement. CHEST: Clear to auscultation. No crackles or wheezes. Non tender on palpation. Diminished breath sounds bilaterally.  Left chest wall AICD in place CVS: S1 and S2 heard, no murmur.  Irregular rhythm. No pericardial rub. ABDOMEN: Soft, non-tender, bowel sounds are present. No palpable hepato-splenomegaly. EXTREMITIES: No edema.  Right hip tenderness on palpation with external rotation of the right lower extremity.  Distal pulses are palpable CNS: Cranial nerves are intact. No focal motor or sensory deficits. SKIN: warm and dry without rashes.  Data Review: I have personally reviewed the following laboratory data and studies,  CBC: Recent Labs  Lab 12/16/18 0727 12/17/18 0437  WBC 8.0 7.3  NEUTROABS 6.2  --   HGB 12.4 11.0*  HCT 37.4 33.6*  MCV 96.1 97.1  PLT 130* [id number]*   Basic Metabolic Panel: Recent Labs  Lab 12/16/18 0727  NA 135  K 3.3*  CL 96*  CO2 27  GLUCOSE 102*  BUN 21  CREATININE 0.97  CALCIUM 8.7*   Liver Function Tests: Recent Labs  Lab 12/16/18 0727  AST 71*  ALT 66*  ALKPHOS 56  BILITOT 1.3*  PROT 6.6  ALBUMIN 4.1   No results for input(s): LIPASE, AMYLASE in the last 168 hours. No results for input(s): AMMONIA in the last 168 hours. Cardiac Enzymes: No results for input(s): CKTOTAL, CKMB, CKMBINDEX, TROPONINI in the last 168 hours. BNP (last 3 results) No results for input(s): BNP in the last 8760 hours.  ProBNP (last 3 results) No results for input(s): PROBNP in the last 8760 hours.  CBG: Recent Labs  Lab 12/17/18 0745  GLUCAP 106*   Recent Results (from the past 240 hour(s))  SARS Coronavirus 2 [facility] order, Performed in [facility] hospital lab) Nasopharyngeal Nasal Mucosa     Status: None    Collection Time: 12/17/18  8:54 AM   Specimen: Nasal Mucosa; Nasopharyngeal  Result Value Ref Range Status   SARS Coronavirus 2 NEGATIVE NEGATIVE Final    Comment: (NOTE) If result is NEGATIVE SARS-CoV-2 target nucleic acids are NOT DETECTED. The SARS-CoV-2 RNA is generally detectable in upper and lower  respiratory specimens during the acute phase of infection. The lowest  concentration of SARS-CoV-2 viral copies this assay can detect is 250  copies / mL. A negative result does not preclude SARS-CoV-2 infection  and should not be used as the sole basis for treatment or other  patient management decisions.  A negative result may occur with  improper specimen collection / handling, submission of specimen other  than nasopharyngeal swab, presence of viral mutation(s) within the  areas targeted by this assay, and inadequate number of viral copies  (<250 copies / mL). A negative result must be combined with clinical  observations, patient history, and epidemiological information. If result is POSITIVE SARS-CoV-2 target nucleic acids are DETECTED. The SARS-CoV-2 RNA is generally detectable in upper  and lower  respiratory specimens dur ing the acute phase of infection.  Positive  results are indicative of active infection with SARS-CoV-2.  Clinical  correlation with patient history and other diagnostic information is  necessary to determine patient infection status.  Positive results do  not rule out bacterial infection or co-infection with other viruses. If result is PRESUMPTIVE POSTIVE SARS-CoV-2 nucleic acids MAY BE PRESENT.   A presumptive positive result was obtained on the submitted specimen  and confirmed on repeat testing.  While 2019 novel coronavirus  (SARS-CoV-2) nucleic acids may be present in the submitted sample  additional confirmatory testing may be necessary for epidemiological  and / or clinical management purposes  to differentiate between  SARS-CoV-2 and other  Sarbecovirus currently known to infect humans.  If clinically indicated additional testing with an alternate test  methodology [phone]) is advised. The SARS-CoV-2 RNA is generally  detectable in upper and lower respiratory sp ecimens during the acute  phase of infection. The expected result is Negative. Fact Sheet for Patients:  StrictlyIdeas.no Fact Sheet for Healthcare Providers: BankingDealers.co.za This test is not yet approved or cleared by the [place] FDA and has been authorized for detection and/or diagnosis of SARS-CoV-2 by FDA under an Emergency Use Authorization (EUA).  This EUA will remain in effect (meaning this test can be used) for the duration of the COVID-19 declaration under Section 564(b)(1) of the Act, 21 U.S.C. section 360bbb-3(b)(1), unless the authorization is terminated or revoked sooner. Performed at [facility], [place] [address] [place], [place] [address]      Studies: Dg Knee Right Port  Result Date: 12/16/2018 CLINICAL DATA:  Pain after fall 1 day ago. EXAM: PORTABLE RIGHT KNEE - 1-2 VIEW COMPARISON:  None. FINDINGS: Chondrocalcinosis. Vascular calcifications. No fractures, dislocations, or effusions. IMPRESSION: No acute abnormalities.  Chondrocalcinosis consistent with CPPD. Electronically Signed   By: Dorise Bullion III M.D   On: 12/16/2018 12:40    Scheduled Meds: . acetaminophen  500 mg Oral Q6H  . amiodarone  200 mg Oral Daily  . atorvastatin  40 mg Oral Daily  . citalopram  20 mg Oral Daily  . feeding supplement  296 mL Oral Once  . levothyroxine  25 mcg Oral QAC breakfast  . metoprolol succinate  25 mg Oral BID  . povidone-iodine  2 application Topical Once    Continuous Infusions: . sodium chloride 75 mL/hr at 12/17/18 0043  .  ceFAZolin (ANCEF) IV    . sodium chloride Stopped (12/16/18 1628)  . tranexamic acid       Flora Lipps, MD  [facility]  12/17/2018

## 2018-12-18 ENCOUNTER — Inpatient Hospital Stay (HOSPITAL_COMMUNITY): Payer: Medicare Other

## 2018-12-18 ENCOUNTER — Encounter (HOSPITAL_COMMUNITY)
Admission: AD | Disposition: A | Discharge status: Home Health | Payer: Self-pay | Source: Other Acute Inpatient Hospital | Attending: Internal Medicine

## 2018-12-18 ENCOUNTER — Inpatient Hospital Stay (HOSPITAL_COMMUNITY): Payer: Medicare Other | Admitting: Certified Registered Nurse Anesthetist

## 2018-12-18 HISTORY — PX: TOTAL HIP ARTHROPLASTY: SHX124

## 2018-12-18 LAB — TYPE AND SCREEN
ABO/RH(D): A POS
Antibody Screen: NEGATIVE

## 2018-12-18 LAB — BASIC METABOLIC PANEL
Anion gap: 6 (ref 5–15)
BUN: 13 mg/dL (ref 8–23)
CO2: 22 mmol/L (ref 22–32)
Calcium: 7.8 mg/dL — ABNORMAL LOW (ref 8.9–10.3)
Chloride: 103 mmol/L (ref 98–111)
Creatinine, Ser: 0.95 mg/dL (ref 0.44–1.00)
GFR calc Af Amer: >60 mL/min (ref >60)
GFR calc non Af Amer: 57 mL/min — ABNORMAL LOW (ref >60)
Glucose, Bld: 206 mg/dL — ABNORMAL HIGH (ref 70–99)
Potassium: 3.2 mmol/L — ABNORMAL LOW (ref 3.5–5.1)
Sodium: 131 mmol/L — ABNORMAL LOW (ref 135–145)

## 2018-12-18 LAB — CBC
HCT: 31.6 % — ABNORMAL LOW (ref 36.0–46.0)
Hemoglobin: 10.2 g/dL — ABNORMAL LOW (ref 12.0–15.0)
MCH: 31.7 pg (ref 26.0–34.0)
MCHC: 32.3 g/dL (ref 30.0–36.0)
MCV: 98.1 fL (ref 80.0–100.0)
Platelets: 113 10*3/uL — ABNORMAL LOW (ref 150–400)
RBC: 3.22 MIL/uL — ABNORMAL LOW (ref 3.87–5.11)
RDW: 13.2 % (ref 11.5–15.5)
WBC: 6.8 10*3/uL (ref 4.0–10.5)
nRBC: 0 % (ref 0.0–0.2)

## 2018-12-18 LAB — GLUCOSE, CAPILLARY: Glucose-Capillary: 101 mg/dL — ABNORMAL HIGH (ref 70–99)

## 2018-12-18 LAB — MAGNESIUM: Magnesium: 2 mg/dL (ref 1.7–2.4)

## 2018-12-18 SURGERY — ARTHROPLASTY, HIP, TOTAL, ANTERIOR APPROACH
Anesthesia: General | Site: Hip | Laterality: Right

## 2018-12-18 MED ORDER — SENNA 8.6 MG PO TABS
1.0000 | ORAL_TABLET | Freq: Two times a day (BID) | ORAL | Status: DC
  Administered 2018-12-18 – 2018-12-21 (×6): 8.6 mg via ORAL
  Filled 2018-12-18 (×6): qty 1

## 2018-12-18 MED ORDER — METOCLOPRAMIDE HCL 5 MG/ML IJ SOLN: 5.0000 mg | Freq: Three times a day (TID) | INTRAMUSCULAR | Status: DC | PRN

## 2018-12-18 MED ORDER — MORPHINE SULFATE (PF) 2 MG/ML IV SOLN: 0.5000 mg | INTRAVENOUS | Status: DC | PRN

## 2018-12-18 MED ORDER — HYDROCODONE-ACETAMINOPHEN 5-325 MG PO TABS: 1.0000 | ORAL_TABLET | ORAL | Status: DC | PRN

## 2018-12-18 MED ORDER — PHENYLEPHRINE 40 MCG/ML (10ML) SYRINGE FOR IV PUSH (FOR BLOOD PRESSURE SUPPORT)
PREFILLED_SYRINGE | INTRAVENOUS | Status: DC | PRN
  Administered 2018-12-18: 80 ug via INTRAVENOUS
  Administered 2018-12-18: 40 ug via INTRAVENOUS

## 2018-12-18 MED ORDER — KETOROLAC TROMETHAMINE 15 MG/ML IJ SOLN
7.5000 mg | Freq: Four times a day (QID) | INTRAMUSCULAR | Status: AC
  Administered 2018-12-18 – 2018-12-19 (×3): 7.5 mg via INTRAVENOUS
  Filled 2018-12-18 (×3): qty 1

## 2018-12-18 MED ORDER — ISOPROPYL ALCOHOL 70 % SOLN
Status: AC
  Filled 2018-12-18: qty 480

## 2018-12-18 MED ORDER — TRANEXAMIC ACID 1000 MG/10ML IV SOLN
INTRAVENOUS | Status: DC | PRN
  Administered 2018-12-18: 1000 mg via INTRAVENOUS

## 2018-12-18 MED ORDER — ISOPROPYL ALCOHOL 70 % SOLN
Status: DC | PRN
  Administered 2018-12-18: 1 via TOPICAL

## 2018-12-18 MED ORDER — CEFAZOLIN SODIUM-DEXTROSE 2-4 GM/100ML-% IV SOLN
2.0000 g | Freq: Four times a day (QID) | INTRAVENOUS | Status: AC
  Administered 2018-12-18 – 2018-12-19 (×2): 2 g via INTRAVENOUS
  Filled 2018-12-18 (×2): qty 100

## 2018-12-18 MED ORDER — FENTANYL CITRATE (PF) 100 MCG/2ML IJ SOLN
INTRAMUSCULAR | Status: AC
  Filled 2018-12-18: qty 2

## 2018-12-18 MED ORDER — METOCLOPRAMIDE HCL 5 MG PO TABS: 5.0000 mg | ORAL_TABLET | Freq: Three times a day (TID) | ORAL | Status: DC | PRN

## 2018-12-18 MED ORDER — TRANEXAMIC ACID-NACL 1000-0.7 MG/100ML-% IV SOLN
INTRAVENOUS | Status: AC
  Filled 2018-12-18: qty 100

## 2018-12-18 MED ORDER — DEXAMETHASONE SODIUM PHOSPHATE 10 MG/ML IJ SOLN
INTRAMUSCULAR | Status: DC | PRN
  Administered 2018-12-18: 10 mg via INTRAVENOUS

## 2018-12-18 MED ORDER — BUPIVACAINE-EPINEPHRINE 0.5% -1:200000 IJ SOLN
INTRAMUSCULAR | Status: AC
  Filled 2018-12-18: qty 1

## 2018-12-18 MED ORDER — CEFAZOLIN SODIUM-DEXTROSE 2-4 GM/100ML-% IV SOLN
INTRAVENOUS | Status: AC
  Filled 2018-12-18: qty 100

## 2018-12-18 MED ORDER — APIXABAN 2.5 MG PO TABS
2.5000 mg | ORAL_TABLET | Freq: Two times a day (BID) | ORAL | Status: DC
  Administered 2018-12-19 – 2018-12-21 (×5): 2.5 mg via ORAL
  Filled 2018-12-18 (×5): qty 1

## 2018-12-18 MED ORDER — MENTHOL 3 MG MT LOZG: 1.0000 | LOZENGE | OROMUCOSAL | Status: DC | PRN

## 2018-12-18 MED ORDER — CEFAZOLIN SODIUM-DEXTROSE 2-3 GM-%(50ML) IV SOLR
INTRAVENOUS | Status: DC | PRN
  Administered 2018-12-18: 2 g via INTRAVENOUS

## 2018-12-18 MED ORDER — SODIUM CHLORIDE (PF) 0.9 % IJ SOLN
INTRAMUSCULAR | Status: DC | PRN
  Administered 2018-12-18: 30 mL

## 2018-12-18 MED ORDER — WATER FOR IRRIGATION, STERILE IR SOLN
Status: DC | PRN
  Administered 2018-12-18: 2000 mL

## 2018-12-18 MED ORDER — MIDAZOLAM HCL 2 MG/2ML IJ SOLN
INTRAMUSCULAR | Status: AC
  Filled 2018-12-18: qty 2

## 2018-12-18 MED ORDER — POVIDONE-IODINE 10 % EX SWAB: 2.0000 "application " | Freq: Once | CUTANEOUS | Status: DC

## 2018-12-18 MED ORDER — DOCUSATE SODIUM 100 MG PO CAPS
100.0000 mg | ORAL_CAPSULE | Freq: Two times a day (BID) | ORAL | Status: DC
  Administered 2018-12-18 – 2018-12-21 (×6): 100 mg via ORAL
  Filled 2018-12-18 (×6): qty 1

## 2018-12-18 MED ORDER — PROPOFOL 10 MG/ML IV BOLUS
INTRAVENOUS | Status: DC | PRN
  Administered 2018-12-18: 80 mg via INTRAVENOUS

## 2018-12-18 MED ORDER — HYDROCODONE-ACETAMINOPHEN 7.5-325 MG PO TABS: 1.0000 | ORAL_TABLET | ORAL | Status: DC | PRN

## 2018-12-18 MED ORDER — SUGAMMADEX SODIUM 200 MG/2ML IV SOLN
INTRAVENOUS | Status: DC | PRN
  Administered 2018-12-18: 120 mg via INTRAVENOUS

## 2018-12-18 MED ORDER — PHENOL 1.4 % MT LIQD: 1.0000 | OROMUCOSAL | Status: DC | PRN

## 2018-12-18 MED ORDER — KETOROLAC TROMETHAMINE 30 MG/ML IJ SOLN
INTRAMUSCULAR | Status: AC
  Filled 2018-12-18: qty 1

## 2018-12-18 MED ORDER — SODIUM CHLORIDE 0.9 % IV SOLN
INTRAVENOUS | Status: DC | PRN
  Administered 2018-12-18: 25 ug/min via INTRAVENOUS

## 2018-12-18 MED ORDER — KETOROLAC TROMETHAMINE 30 MG/ML IJ SOLN
INTRAMUSCULAR | Status: DC | PRN
  Administered 2018-12-18: 30 mg via INTRAMUSCULAR

## 2018-12-18 MED ORDER — POTASSIUM CHLORIDE 10 MEQ/100ML IV SOLN
10.0000 meq | INTRAVENOUS | Status: AC
  Administered 2018-12-18 (×4): 10 meq via INTRAVENOUS
  Filled 2018-12-18 (×4): qty 100

## 2018-12-18 MED ORDER — LIDOCAINE 2% (20 MG/ML) 5 ML SYRINGE
INTRAMUSCULAR | Status: DC | PRN
  Administered 2018-12-18: 40 mg via INTRAVENOUS

## 2018-12-18 MED ORDER — ACETAMINOPHEN 325 MG PO TABS
325.0000 mg | ORAL_TABLET | Freq: Four times a day (QID) | ORAL | Status: DC | PRN
  Filled 2018-12-18: qty 2

## 2018-12-18 MED ORDER — LACTATED RINGERS IV SOLN
INTRAVENOUS | Status: DC
  Administered 2018-12-18: 11:00:00 via INTRAVENOUS

## 2018-12-18 MED ORDER — FENTANYL CITRATE (PF) 100 MCG/2ML IJ SOLN
INTRAMUSCULAR | Status: DC | PRN
  Administered 2018-12-18: 50 ug via INTRAVENOUS

## 2018-12-18 MED ORDER — POTASSIUM CHLORIDE 10 MEQ/100ML IV SOLN
10.0000 meq | INTRAVENOUS | Status: AC
  Administered 2018-12-18 (×3): 10 meq via INTRAVENOUS
  Filled 2018-12-18 (×4): qty 100

## 2018-12-18 MED ORDER — BUPIVACAINE-EPINEPHRINE 0.25% -1:200000 IJ SOLN
INTRAMUSCULAR | Status: DC | PRN
  Administered 2018-12-18: 30 mL

## 2018-12-18 MED ORDER — PROPOFOL 10 MG/ML IV BOLUS
INTRAVENOUS | Status: AC
  Filled 2018-12-18: qty 60

## 2018-12-18 MED ORDER — SODIUM CHLORIDE (PF) 0.9 % IJ SOLN
INTRAMUSCULAR | Status: AC
  Filled 2018-12-18: qty 50

## 2018-12-18 MED ORDER — SODIUM CHLORIDE 0.9 % IR SOLN
Status: DC | PRN
  Administered 2018-12-18: 1000 mL

## 2018-12-18 MED ORDER — ONDANSETRON HCL 4 MG PO TABS: 4.0000 mg | ORAL_TABLET | Freq: Four times a day (QID) | ORAL | Status: DC | PRN

## 2018-12-18 MED ORDER — FENTANYL CITRATE (PF) 100 MCG/2ML IJ SOLN: 25.0000 ug | INTRAMUSCULAR | Status: DC | PRN

## 2018-12-18 MED ORDER — ROCURONIUM BROMIDE 100 MG/10ML IV SOLN
INTRAVENOUS | Status: DC | PRN
  Administered 2018-12-18: 60 mg via INTRAVENOUS

## 2018-12-18 MED ORDER — ONDANSETRON HCL 4 MG/2ML IJ SOLN: 4.0000 mg | Freq: Four times a day (QID) | INTRAMUSCULAR | Status: DC | PRN

## 2018-12-18 SURGICAL SUPPLY — 59 items
ACETAB CUP W/GRIPTION 54 (Plate) ×2 IMPLANT
ARTICULEZE HEAD 36 12 (Hips) ×2 IMPLANT
BAG DECANTER FOR FLEXI CONT (MISCELLANEOUS) IMPLANT
BAG ZIPLOCK 12X15 (MISCELLANEOUS) IMPLANT
BLADE SURG SZ10 CARB STEEL (BLADE) IMPLANT
CHLORAPREP W/TINT 26 (MISCELLANEOUS) ×2 IMPLANT
COVER PERINEAL POST (MISCELLANEOUS) ×2 IMPLANT
COVER SURGICAL LIGHT HANDLE (MISCELLANEOUS) ×2 IMPLANT
COVER WAND RF STERILE (DRAPES) IMPLANT
CUP ACETAB W/GRIPTION 54 (Plate) IMPLANT
DECANTER SPIKE VIAL GLASS SM (MISCELLANEOUS) ×2 IMPLANT
DERMABOND ADVANCED (GAUZE/BANDAGES/DRESSINGS) ×2
DERMABOND ADVANCED .7 DNX12 (GAUZE/BANDAGES/DRESSINGS) ×2 IMPLANT
DRAPE IMP U-DRAPE 54X76 (DRAPES) ×2 IMPLANT
DRAPE SHEET LG 3/4 BI-LAMINATE (DRAPES) ×6 IMPLANT
DRAPE STERI IOBAN 125X83 (DRAPES) IMPLANT
DRAPE U-SHAPE 47X51 STRL (DRAPES) ×4 IMPLANT
DRSG AQUACEL AG ADV 3.5X10 (GAUZE/BANDAGES/DRESSINGS) ×2 IMPLANT
ELECT PENCIL ROCKER SW 15FT (MISCELLANEOUS) ×2 IMPLANT
ELECT REM PT RETURN 15FT ADLT (MISCELLANEOUS) ×2 IMPLANT
GAUZE SPONGE 4X4 12PLY STRL (GAUZE/BANDAGES/DRESSINGS) ×2 IMPLANT
GLOVE BIO SURGEON STRL SZ8.5 (GLOVE) ×4 IMPLANT
GLOVE BIOGEL PI IND STRL 8.5 (GLOVE) ×1 IMPLANT
GLOVE BIOGEL PI INDICATOR 8.5 (GLOVE) ×1
GOWN SPEC L3 XXLG W/TWL (GOWN DISPOSABLE) ×2 IMPLANT
HANDPIECE INTERPULSE COAX TIP (DISPOSABLE) ×1
HEAD ARTICULEZE 36 12 (Hips) IMPLANT
HOLDER FOLEY CATH W/STRAP (MISCELLANEOUS) ×2 IMPLANT
HOOD PEEL AWAY FLYTE STAYCOOL (MISCELLANEOUS) ×8 IMPLANT
JET LAVAGE IRRISEPT WOUND (IRRIGATION / IRRIGATOR) ×2
KIT TURNOVER KIT A (KITS) IMPLANT
LAVAGE JET IRRISEPT WOUND (IRRIGATION / IRRIGATOR) ×1 IMPLANT
LINER NEUTRAL 54X36MM PLUS 4 (Hips) ×1 IMPLANT
MANIFOLD NEPTUNE II (INSTRUMENTS) ×2 IMPLANT
MARKER SKIN DUAL TIP RULER LAB (MISCELLANEOUS) ×2 IMPLANT
NDL SAFETY ECLIPSE 18X1.5 (NEEDLE) ×1 IMPLANT
NDL SPNL 18GX3.5 QUINCKE PK (NEEDLE) ×1 IMPLANT
NEEDLE HYPO 18GX1.5 SHARP (NEEDLE) ×1
NEEDLE SPNL 18GX3.5 QUINCKE PK (NEEDLE) ×2 IMPLANT
PACK ANTERIOR HIP CUSTOM (KITS) ×2 IMPLANT
SAW OSC TIP CART 19.5X105X1.3 (SAW) ×2 IMPLANT
SEALER BIPOLAR AQUA 6.0 (INSTRUMENTS) ×2 IMPLANT
SET HNDPC FAN SPRY TIP SCT (DISPOSABLE) ×1 IMPLANT
SLEEVE CABLE 2MM VT (Orthopedic Implant) ×1 IMPLANT
STEM TRI LOC GRIPTION SZ 3 STD IMPLANT
SUT ETHIBOND NAB CT1 #1 30IN (SUTURE) ×4 IMPLANT
SUT MNCRL AB 3-0 PS2 18 (SUTURE) ×2 IMPLANT
SUT MNCRL AB 4-0 PS2 18 (SUTURE) ×2 IMPLANT
SUT MON AB 2-0 CT1 36 (SUTURE) ×4 IMPLANT
SUT STRATAFIX PDO 1 14 VIOLET (SUTURE) ×1
SUT STRATFX PDO 1 14 VIOLET (SUTURE) ×1
SUT VIC AB 2-0 CT1 27 (SUTURE) ×1
SUT VIC AB 2-0 CT1 TAPERPNT 27 (SUTURE) ×1 IMPLANT
SUTURE STRATFX PDO 1 14 VIOLET (SUTURE) ×1 IMPLANT
SYR 3ML LL SCALE MARK (SYRINGE) ×2 IMPLANT
TRAY FOLEY MTR SLVR 16FR STAT (SET/KITS/TRAYS/PACK) IMPLANT
TRI LOC GRIPTION SZ 3 STD ×2 IMPLANT
WATER STERILE IRR 1000ML POUR (IV SOLUTION) ×2 IMPLANT
YANKAUER SUCT BULB TIP 10FT TU (MISCELLANEOUS) ×2 IMPLANT

## 2018-12-18 NOTE — Op Note (Signed)
OPERATIVE REPORT  SURGEON: Rod Can, MD   ASSISTANT: Staff.  PREOPERATIVE DIAGNOSIS: Displaced Right femoral neck fracture.   POSTOPERATIVE DIAGNOSIS: Displaced Right femoral neck fracture.   PROCEDURE: Right total hip arthroplasty, anterior approach.   IMPLANTS: DePuy Tri Lock stem, size 3, std offset. DePuy Pinnacle Cup, size 54 mm. DePuy Altrx liner, size 36 by 54 mm, +4 neutral. DePuy metal head ball, size 36 + 1.5 mm. Dall Miles 2.0 mm adult reconstruction cable x1.  ANESTHESIA:  General  ANTIBIOTICS: 2g ancef.  ESTIMATED BLOOD LOSS:-300 mL    DRAINS: None.  COMPLICATIONS: None   CONDITION: PACU - hemodynamically stable.   BRIEF CLINICAL NOTE: Cynthia Frank is a 83 y.o. female with a displaced Right femoral neck fracture. The patient was admitted to the hospitalist service and underwent perioperative risk stratification and medical optimization. The risks, benefits, and alternatives to total hip arthroplasty were explained, and the patient elected to proceed.  PROCEDURE IN DETAIL: The patient was taken to the operating room and general anesthesia was induced on the hospital bed.  The patient was then positioned on the Hana table.  All bony prominences were well padded.  The hip was prepped and draped in the normal sterile surgical fashion.  A time-out was called verifying side and site of surgery. Antibiotics were given within 60 minutes of beginning the procedure.  The direct anterior approach to the hip was performed through the Hueter interval.  Lateral femoral circumflex vessels were treated with the Auqumantys. The anterior capsule was exposed and an inverted T capsulotomy was made.  Fracture hematoma was encountered and evacuated. The patient was found to have a comminuted Right subcapital femoral neck fracture.  I freshened the femoral neck cut with a saw.  I removed the femoral neck fragment.  A corkscrew was placed into the head and the head was  removed.  This was passed to the back table and was measured.  There was a small V shaped defect within the posterior femoral neck.  Therefore I elected to place a single 2.0 mm adult reconstruction cable prophylactically subperiosteally.   Acetabular exposure was achieved, and the pulvinar and labrum were excised. Sequential reaming of the acetabulum was then performed up to a size 53 mm reamer. A 54 mm cup was then opened and impacted into place at approximately 40 degrees of abduction and 20 degrees of anteversion. The final polyethylene liner was impacted into place.    I then gained femoral exposure taking care to protect the abductors and greater trochanter.  This was performed using standard external rotation, extension, and adduction.  The capsule was peeled off the inner aspect of the greater trochanter, taking care to preserve the short external rotators. A cookie cutter was used to enter the femoral canal, and then the femoral canal finder was used to confirm location.  I then sequentially broached up to a size 3.  Calcar planer was used on the femoral neck remnant.  I paced a std neck and a trial head ball. The hip was reduced.  Leg lengths were checked fluoroscopically.  The hip was dislocated and trial components were removed.  I placed the real stem followed by the real spacer and head ball.  The hip was reduced.  Fluoroscopy was used to confirm component position and leg lengths.  At 90 degrees of external rotation and extension, the hip was stable to an anterior directed force.   The wound was copiously irrigated with Irrisept solution and normal saline  using pule lavage.  Marcaine solution was injected into the periarticular soft tissue.  The wound was closed in layers using #1 Vicryl and V-Loc for the fascia, 2-0 Vicryl for the subcutaneous fat, 2-0 Monocryl for the deep dermal layer, 3-0 running Monocryl subcuticular stitch and glue for the skin.  Once the glue was fully dried, an Aquacell  Ag dressing was applied.  The patient was then awakened from anesthesia and transported to the recovery room in stable condition.  Sponge, needle, and instrument counts were correct at the end of the case x2.  The patient tolerated the procedure well and there were no known complications.

## 2018-12-18 NOTE — Discharge Instructions (Signed)
°Dr. Chanell Nadeau °Joint Replacement Specialist °[facility] °[address] °[place], [place] [address] °[phone] ° ° °TOTAL HIP REPLACEMENT POSTOPERATIVE DIRECTIONS ° ° ° °Hip Rehabilitation, Guidelines Following Surgery  ° °WEIGHT BEARING °Weight bearing as tolerated with assist device (walker, cane, etc) as directed, use it as long as suggested by your surgeon or therapist, typically at least 4-6 weeks. ° °The results of a hip operation are greatly improved after range of motion and muscle strengthening exercises. Follow all safety measures which are given to protect your hip. If any of these exercises cause increased pain or swelling in your joint, decrease the amount until you are comfortable again. Then slowly increase the exercises. Call your caregiver if you have problems or questions.  ° °HOME CARE INSTRUCTIONS  °Most of the following instructions are designed to prevent the dislocation of your new hip.  °Remove items at home which could result in a fall. This includes throw rugs or furniture in walking pathways.  °Continue medications as instructed at time of discharge. °· You may have some home medications which will be placed on hold until you complete the course of blood thinner medication. °· You may start showering once you are discharged home. Do not remove your dressing. °Do not put on socks or shoes without following the instructions of your caregivers.   °Sit on chairs with arms. Use the chair arms to help push yourself up when arising.  °Arrange for the use of a toilet seat elevator so you are not sitting low.  °· Walk with walker as instructed.  °You may resume a sexual relationship in one month or when given the OK by your caregiver.  °Use walker as long as suggested by your caregivers.  °You may put full weight on your legs and walk as much as is comfortable. °Avoid periods of inactivity such as sitting longer than an hour when not asleep. This helps prevent  blood clots.  °You may return to work once you are cleared by your surgeon.  °Do not drive a car for 6 weeks or until released by your surgeon.  °Do not drive while taking narcotics.  °Wear elastic stockings for two weeks following surgery during the day but you may remove then at night.  °Make sure you keep all of your appointments after your operation with all of your doctors and caregivers. You should call the office at the above phone number and make an appointment for approximately two weeks after the date of your surgery. °Please pick up a stool softener and laxative for home use as long as you are requiring pain medications. °· ICE to the affected hip every three hours for 30 minutes at a time and then as needed for pain and swelling. Continue to use ice on the hip for pain and swelling from surgery. You may notice swelling that will progress down to the foot and ankle.  This is normal after surgery.  Elevate the leg when you are not up walking on it.   °It is important for you to complete the blood thinner medication as prescribed by your doctor. °· Continue to use the breathing machine which will help keep your temperature down.  It is common for your temperature to cycle up and down following surgery, especially at night when you are not up moving around and exerting yourself.  The breathing machine keeps your lungs expanded and your temperature down. ° °RANGE OF MOTION AND STRENGTHENING EXERCISES  °These exercises are   designed to help you keep full movement of your hip joint. Follow your caregiver's or physical therapist's instructions. Perform all exercises about fifteen times, three times per day or as directed. Exercise both hips, even if you have had only one joint replacement. These exercises can be done on a training (exercise) mat, on the floor, on a table or on a bed. Use whatever works the best and is most comfortable for you. Use music or television while you are exercising so that the exercises  are a pleasant break in your day. This will make your life better with the exercises acting as a break in routine you can look forward to.  °Lying on your back, slowly slide your foot toward your buttocks, raising your knee up off the floor. Then slowly slide your foot back down until your leg is straight again.  °Lying on your back spread your legs as far apart as you can without causing discomfort.  °Lying on your side, raise your upper leg and foot straight up from the floor as far as is comfortable. Slowly lower the leg and repeat.  °Lying on your back, tighten up the muscle in the front of your thigh (quadriceps muscles). You can do this by keeping your leg straight and trying to raise your heel off the floor. This helps strengthen the largest muscle supporting your knee.  °Lying on your back, tighten up the muscles of your buttocks both with the legs straight and with the knee bent at a comfortable angle while keeping your heel on the floor.  ° °SKILLED REHAB INSTRUCTIONS: °If the patient is transferred to a skilled rehab facility following release from the hospital, a list of the current medications will be sent to the facility for the patient to continue.  When discharged from the skilled rehab facility, please have the facility set up the patient's Home Health Physical Therapy prior to being released. Also, the skilled facility will be responsible for providing the patient with their medications at time of release from the facility to include their pain medication and their blood thinner medication. If the patient is still at the rehab facility at time of the two week follow up appointment, the skilled rehab facility will also need to assist the patient in arranging follow up appointment in our office and any transportation needs. ° °MAKE SURE YOU:  °Understand these instructions.  °Will watch your condition.  °Will get help right away if you are not doing well or get worse. ° °Pick up stool softner and  laxative for home use following surgery while on pain medications. °Do not remove your dressing. °The dressing is waterproof--it is OK to take showers. °Continue to use ice for pain and swelling after surgery. °Do not use any lotions or creams on the incision until instructed by your surgeon. °Total Hip Protocol. ° ° °

## 2018-12-18 NOTE — Anesthesia Preprocedure Evaluation (Addendum)
Anesthesia Evaluation  Patient identified by MRN, date of birth, ID band Patient awake    Reviewed: Allergy & Precautions, NPO status , Patient's Chart, lab work & pertinent test results, reviewed documented beta blocker date and time   Airway Mallampati: II  TM Distance: >3 FB Neck ROM: Full    Dental no notable dental hx. (+) Teeth Intact, Dental Advisory Given   Pulmonary neg pulmonary ROS, former smoker,    Pulmonary exam normal breath sounds clear to auscultation       Cardiovascular hypertension, Pt. on home beta blockers and Pt. on medications + CAD and +CHF  Normal cardiovascular exam+ dysrhythmias (on amiodarone and eliquis) Atrial Fibrillation + Cardiac Defibrillator  Rhythm:Regular Rate:Normal  TEE 11/2018 EF 45-50%, mild AI, mild mod TR   Neuro/Psych negative neurological ROS  negative psych ROS   GI/Hepatic Neg liver ROS, GERD  ,  Endo/Other  Hypothyroidism   Renal/GU negative Renal ROS  negative genitourinary   Musculoskeletal  (+) Arthritis ,   Abdominal   Peds  Hematology  (+) Blood dyscrasia (Hgb 10.2, plt 113), anemia ,   Anesthesia Other Findings   Reproductive/Obstetrics                           Anesthesia Physical Anesthesia Plan  ASA: III  Anesthesia Plan: General   Post-op Pain Management:    Induction: Intravenous  PONV Risk Score and Plan: 3 and Midazolam, Dexamethasone and Ondansetron  Airway Management Planned: Oral ETT  Additional Equipment:   Intra-op Plan:   Post-operative Plan: Extubation in OR  Informed Consent: I have reviewed the patients History and Physical, chart, labs and discussed the procedure including the risks, benefits and alternatives for the proposed anesthesia with the patient or authorized representative who has indicated his/her understanding and acceptance.     Dental advisory given  Plan Discussed with: CRNA  Anesthesia  Plan Comments:         Anesthesia Quick Evaluation

## 2018-12-18 NOTE — Anesthesia Procedure Notes (Signed)
Procedure Name: Intubation Date/Time: 12/18/2018 11:59 AM Performed by: Pilar Grammes, CRNA Pre-anesthesia Checklist: Patient identified, Emergency Drugs available, Suction available, Patient being monitored and Timeout performed Patient Re-evaluated:Patient Re-evaluated prior to induction Oxygen Delivery Method: Circle system utilized Preoxygenation: Pre-oxygenation with 100% oxygen Induction Type: IV induction Ventilation: Mask ventilation without difficulty Laryngoscope Size: Miller and 2 Grade View: Grade II Tube type: Oral Tube size: 7.5 mm Number of attempts: 1 Airway Equipment and Method: Stylet Placement Confirmation: positive ETCO2,  ETT inserted through vocal cords under direct vision,  CO2 detector and breath sounds checked- equal and bilateral Secured at: 22 cm Tube secured with: Tape Dental Injury: Teeth and Oropharynx as per pre-operative assessment

## 2018-12-18 NOTE — Progress Notes (Addendum)
PROGRESS NOTE  Cynthia Frank [id number] DOB: 1936-01-21 DOA: 12/16/2018 PCP: [name], MD   LOS: 2 days   Brief narrative: Cynthia Frank is a 83 y.o. female with medical history significant of non ischemic cardiomyopathy, s/p ICD, chronic systolic heart failure,  CAD, atrial fibrillation, hypertension, hyperlipidemia,  GERD, was [place] and walking in the house , passed out and fell on her right side. Her husband witnessed the fall. She lost consciousness for a few seconds and started having pain in the right leg and right hip.  She was seen at the local ED [facility], was found to have proximal hip fracture,. She requested to be transferred to [facility] hospital for hip repair.  x ray of the right hip with pelvis shows right sub capital hip fracture. She was admitted to medical service and [facility] consulted for evaluation of right hip fracture.  Assessment/Plan:  Principal Problem:   Closed right hip fracture (HCC) Active Problems:   Essential hypertension   Atrial fibrillation (HCC)   Nonischemic cardiomyopathy (HCC)   Hyperlipidemia   GERD (gastroesophageal reflux disease)   Chronic systolic CHF (congestive heart failure) (HCC)   Hypokalemia   Syncope and collapse  Closed right hip fracture status post syncope. Orthopedics on board.  Plan for surgical intervention today.  Seen by cardiology and orthopedics..  Syncope, h/o Non ischemic cardiomyopathy/  Seen by cardiology.   Repeat 2D echocardiogram from 12/16/2018 showed ejection fraction of 45 to 50%.    Chronic systolic heart failure: Appears compensated at this time. .  Lasix and spironolactone on hold. Last Echo from [id number]  shows Systolic function was moderately to severely reduced in the range of 30% to 35%. Diffuse hypokinesis. Doppler parameters are consistent with high ventricular filling pressure.  Denies any dyspnea congestion or chest pain.  Paroxysmal  atrial fibrillation; status post AICD 06/2015. Continue amiodarone and toprol.  EKG with paced rhythm. eliquis on hold.  Heparin drip was initiated but will be kept on hold for surgery.  No need for bridging as per cardiology.  Will resume Eliquis after surgery when okay with orthopedics.  Hypothyroidism:  Continue Synthroid.   Essential hypertension: Controlled at this time.  Resume Lasix and aspirin lactone after surgery depending on clinical course.  Hypokalemia: Continue to replenish with IV fluids.  Check levels in a.m.  Hyperlipidemia:  Resume lipitor once p.o.   GERD; Stable.    VTE Prophylaxis: For surgical intervention today.  Resume anticoagulation if okay with orthopedics postoperatively  Code Status: Full code  Family Communication: None today  Disposition Plan: To be determined.  Awaiting for surgical intervention.  Might need rehabilitation placement.  PT OT to follow-up for surgery.   Consultants:  Orthopedics, cardiology  Procedures:  None so far  Antibiotics: Anti-infectives (From admission, onward)   Start     Dose/Rate Route Frequency Ordered Stop   12/18/18 1019  ceFAZolin (ANCEF) 2-4 GM/100ML-% IVPB    Note to Pharmacy: Marchia Meiers   : cabinet override      12/18/18 1019 12/18/18 2229   12/17/18 0645  ceFAZolin (ANCEF) IVPB 2g/100 mL premix     2 g 200 mL/hr over 30 Minutes Intravenous On call to O.R. 12/17/18 [id number] 12/18/18 0559   12/17/18 0645  ceFAZolin (ANCEF) 3 g in dextrose 5 % 50 mL IVPB  Status:  Discontinued     3 g 100 mL/hr over 30 Minutes Intravenous On call to O.R.  12/17/18 0644 12/17/18 0756      Subjective: Patient denies interval complaints.  Denies any chest pain, palpitation, shortness of breath, awaiting for surgery.  Objective: Vitals:   12/18/18 0441 12/18/18 1006  BP: 102/61 113/76  Pulse: (!) 59 63  Resp: 20 20  Temp: 98 F (36.7 C) 98 F (36.7 C)  SpO2: 94% 93%    Intake/Output Summary (Last 24  hours) at 12/18/2018 1057 Last data filed at 12/18/2018 0600 Gross per 24 hour  Intake 1976 ml  Output 900 ml  Net 1076 ml   Filed Weights   12/16/18 2026  Weight: 64 kg   Body mass index is 23.48 kg/m.   Physical Exam: General:  Average built, not in obvious distress HENT: Normocephalic, pupils equally reacting to light and accommodation.  No scleral pallor or icterus noted. Oral mucosa is moist.  Chest:  Clear breath sounds.  Diminished breath sounds bilaterally. No crackles or wheezes.  CVS: S1 &S2 heard. No murmur.  Irregular rhythm.  Chest wall AICD in place. Abdomen: Soft, nontender, nondistended.  Bowel sounds are heard.  Liver is not palpable, no abdominal mass palpated Extremities: No cyanosis, clubbing or edema.  Peripheral pulses are palpable.  Right hip tenderness with external rotation. Psych: Alert, awake and oriented, normal mood CNS:  No cranial nerve deficits.  Power equal in all extremities.  No sensory deficits noted.  No cerebellar signs.   Skin: Warm and dry.  No rashes noted.  Data Review: I have personally reviewed the following laboratory data and studies,  CBC: Recent Labs  Lab 12/16/18 0727 12/17/18 0437 12/18/18 0432  WBC 8.0 7.3 6.8  NEUTROABS 6.2  --   --   HGB 12.4 11.0* 10.2*  HCT 37.4 33.6* 31.6*  MCV 96.1 97.1 98.1  PLT 130* 108* 123456*   Basic Metabolic Panel: Recent Labs  Lab 12/16/18 0727 12/17/18 1257 12/18/18 0432  NA 135 133* 131*  K 3.3* 3.9 3.2*  CL 96* 103 103  CO2 27 22 22   GLUCOSE 102* 104* 206*  BUN 21 20 13   CREATININE 0.97 1.12* 0.95  CALCIUM 8.7* 8.2* 7.8*  MG  --   --  2.0   Liver Function Tests: Recent Labs  Lab 12/16/18 0727  AST 71*  ALT 66*  ALKPHOS 56  BILITOT 1.3*  PROT 6.6  ALBUMIN 4.1   No results for input(s): LIPASE, AMYLASE in the last 168 hours. No results for input(s): AMMONIA in the last 168 hours. Cardiac Enzymes: No results for input(s): CKTOTAL, CKMB, CKMBINDEX, TROPONINI in the last 168  hours. BNP (last 3 results) No results for input(s): BNP in the last 8760 hours.  ProBNP (last 3 results) No results for input(s): PROBNP in the last 8760 hours.  CBG: Recent Labs  Lab 12/17/18 0745  GLUCAP 106*   Recent Results (from the past 240 hour(s))  SARS Coronavirus 2 [facility] order, Performed in [facility] hospital lab) Nasopharyngeal Nasal Mucosa     Status: None   Collection Time: 12/17/18  8:54 AM   Specimen: Nasal Mucosa; Nasopharyngeal  Result Value Ref Range Status   SARS Coronavirus 2 NEGATIVE NEGATIVE Final    Comment: (NOTE) If result is NEGATIVE SARS-CoV-2 target nucleic acids are NOT DETECTED. The SARS-CoV-2 RNA is generally detectable in upper and lower  respiratory specimens during the acute phase of infection. The lowest  concentration of SARS-CoV-2 viral copies this assay can detect is 250  copies / mL. A negative result does not  preclude SARS-CoV-2 infection  and should not be used as the sole basis for treatment or other  patient management decisions.  A negative result may occur with  improper specimen collection / handling, submission of specimen other  than nasopharyngeal swab, presence of viral mutation(s) within the  areas targeted by this assay, and inadequate number of viral copies  (<250 copies / mL). A negative result must be combined with clinical  observations, patient history, and epidemiological information. If result is POSITIVE SARS-CoV-2 target nucleic acids are DETECTED. The SARS-CoV-2 RNA is generally detectable in upper and lower  respiratory specimens dur ing the acute phase of infection.  Positive  results are indicative of active infection with SARS-CoV-2.  Clinical  correlation with patient history and other diagnostic information is  necessary to determine patient infection status.  Positive results do  not rule out bacterial infection or co-infection with other viruses. If result is PRESUMPTIVE POSTIVE SARS-CoV-2 nucleic  acids MAY BE PRESENT.   A presumptive positive result was obtained on the submitted specimen  and confirmed on repeat testing.  While 2019 novel coronavirus  (SARS-CoV-2) nucleic acids may be present in the submitted sample  additional confirmatory testing may be necessary for epidemiological  and / or clinical management purposes  to differentiate between  SARS-CoV-2 and other Sarbecovirus currently known to infect humans.  If clinically indicated additional testing with an alternate test  methodology [phone]) is advised. The SARS-CoV-2 RNA is generally  detectable in upper and lower respiratory sp ecimens during the acute  phase of infection. The expected result is Negative. Fact Sheet for Patients:  StrictlyIdeas.no Fact Sheet for Healthcare Providers: BankingDealers.co.za This test is not yet approved or cleared by the [place] FDA and has been authorized for detection and/or diagnosis of SARS-CoV-2 by FDA under an Emergency Use Authorization (EUA).  This EUA will remain in effect (meaning this test can be used) for the duration of the COVID-19 declaration under Section 564(b)(1) of the Act, 21 U.S.C. section 360bbb-3(b)(1), unless the authorization is terminated or revoked sooner. Performed at [facility], [place] [address], [place], [place] [address]   Surgical pcr screen     Status: None   Collection Time: 12/17/18  8:54 AM   Specimen: Nasal Mucosa; Nasal Swab  Result Value Ref Range Status   MRSA, PCR NEGATIVE NEGATIVE Final   Staphylococcus aureus NEGATIVE NEGATIVE Final    Comment: (NOTE) The Xpert SA Assay (FDA approved for NASAL specimens in patients 31 years of age and older), is one component of a comprehensive surveillance program. It is not intended to diagnose infection nor to guide or monitor treatment. Performed at [facility], [place] [address] [place], [place] [address]       Studies: Dg Knee Right Port  Result Date: 12/16/2018 CLINICAL DATA:  Pain after fall 1 day ago. EXAM: PORTABLE RIGHT KNEE - 1-2 VIEW COMPARISON:  None. FINDINGS: Chondrocalcinosis. Vascular calcifications. No fractures, dislocations, or effusions. IMPRESSION: No acute abnormalities.  Chondrocalcinosis consistent with CPPD. Electronically Signed   By: Dorise Bullion III M.D   On: 12/16/2018 12:40    Scheduled Meds: . [MAR Hold] acetaminophen  500 mg Oral Q6H  . [MAR Hold] amiodarone  200 mg Oral Daily  . [MAR Hold] atorvastatin  40 mg Oral Daily  . [MAR Hold] citalopram  20 mg Oral Daily  . [MAR Hold] levothyroxine  25 mcg Oral QAC breakfast  . [MAR Hold] magnesium oxide  400 mg Oral  BID  . [MAR Hold] metoprolol succinate  25 mg Oral BID  . povidone-iodine  2 application Topical Once    Continuous Infusions: . sodium chloride 75 mL/hr at 12/18/18 0439  . ceFAZolin    . lactated ringers    . potassium chloride 10 mEq (12/18/18 1024)  . [MAR Hold] sodium chloride Stopped (12/16/18 1628)  . tranexamic acid       Flora Lipps, MD  [facility] 12/18/2018

## 2018-12-18 NOTE — Plan of Care (Signed)

## 2018-12-18 NOTE — Interval H&P Note (Signed)
History and Physical Interval Note:  12/18/2018 11:34 AM  Cynthia Frank  has presented today for surgery, with the diagnosis of right femoral neck fracture.  The various methods of treatment have been discussed with the patient and family. After consideration of risks, benefits and other options for treatment, the patient has consented to  Procedure(s): TOTAL HIP ARTHROPLASTY ANTERIOR APPROACH (Right) as a surgical intervention.  The patient's history has been reviewed, patient examined, no change in status, stable for surgery.  I have reviewed the patient's chart and labs.  Questions were answered to the patient's satisfaction.    The risks, benefits, and alternatives were discussed with the patient. There are risks associated with the surgery including, but not limited to, problems with anesthesia (death), infection, instability (giving out of the joint), dislocation, differences in leg length/angulation/rotation, fracture of bones, loosening or failure of implants, hematoma (blood accumulation) which may require surgical drainage, blood clots, pulmonary embolism, nerve injury (foot drop and lateral thigh numbness), and blood vessel injury. The patient understands these risks and elects to proceed.    Hilton Cork Briteny Fulghum

## 2018-12-18 NOTE — Progress Notes (Signed)
Per Cardiology note from 8/31 "We will check her [employer] BiV ICD function prior to surgery and also assess for any recent arrhythmias on the device. We will contact [employer]"  Tomi Bamberger Hayes/[employer] rep called [place] and reports she will be here approx 11a today in short stay.

## 2018-12-18 NOTE — Progress Notes (Signed)
Patient has <100cc in foley bag. Arrived to room at 1600.  Noted 275 ml emptied prior to transport to her room in PACU. Reported to oncoming RN to monitor patient's urine output over the next couple hours for improvement.

## 2018-12-18 NOTE — Transfer of Care (Signed)
Immediate Anesthesia Transfer of Care Note  Patient: Cynthia Frank  Procedure(s) Performed: TOTAL HIP ARTHROPLASTY ANTERIOR APPROACH (Right Hip)  Patient Location: PACU  Anesthesia Type:General  Level of Consciousness: awake, sedated and patient cooperative  Airway & Oxygen Therapy: Patient Spontanous Breathing and Patient connected to face mask oxygen  Post-op Assessment: Report given to RN, Post -op Vital signs reviewed and stable and Patient moving all extremities  Post vital signs: stable  Last Vitals:  Vitals Value Taken Time  BP 93/57 12/18/18 1416  Temp    Pulse 65 12/18/18 1420  Resp 19 12/18/18 1420  SpO2 88 % 12/18/18 1420  Vitals shown include unvalidated device data.  Last Pain:  Vitals:   12/18/18 1006  TempSrc: Oral  PainSc:       Patients Stated Pain Goal: 3 ([id number])  Complications: No apparent anesthesia complications

## 2018-12-19 ENCOUNTER — Encounter (HOSPITAL_COMMUNITY): Payer: Self-pay | Admitting: Orthopedic Surgery

## 2018-12-19 LAB — CBC
HCT: 32.1 % — ABNORMAL LOW (ref 36.0–46.0)
Hemoglobin: 10.1 g/dL — ABNORMAL LOW (ref 12.0–15.0)
MCH: 31.8 pg (ref 26.0–34.0)
MCHC: 31.5 g/dL (ref 30.0–36.0)
MCV: 100.9 fL — ABNORMAL HIGH (ref 80.0–100.0)
Platelets: 110 10*3/uL — ABNORMAL LOW (ref 150–400)
RBC: 3.18 MIL/uL — ABNORMAL LOW (ref 3.87–5.11)
RDW: 13.4 % (ref 11.5–15.5)
WBC: 11.9 10*3/uL — ABNORMAL HIGH (ref 4.0–10.5)
nRBC: 0 % (ref 0.0–0.2)

## 2018-12-19 LAB — BASIC METABOLIC PANEL WITH GFR
Anion gap: 6 (ref 5–15)
BUN: 14 mg/dL (ref 8–23)
CO2: 19 mmol/L — ABNORMAL LOW (ref 22–32)
Calcium: 7.9 mg/dL — ABNORMAL LOW (ref 8.9–10.3)
Chloride: 108 mmol/L (ref 98–111)
Creatinine, Ser: 0.94 mg/dL (ref 0.44–1.00)
GFR calc Af Amer: >60 mL/min
GFR calc non Af Amer: 57 mL/min — ABNORMAL LOW
Glucose, Bld: 126 mg/dL — ABNORMAL HIGH (ref 70–99)
Potassium: 5.2 mmol/L — ABNORMAL HIGH (ref 3.5–5.1)
Sodium: 133 mmol/L — ABNORMAL LOW (ref 135–145)

## 2018-12-19 MED ORDER — HYDROCODONE-ACETAMINOPHEN 5-325 MG PO TABS: 1.0000 | ORAL_TABLET | ORAL | 0 refills | Status: DC | PRN

## 2018-12-19 MED ORDER — METOPROLOL SUCCINATE ER 25 MG PO TB24: 25.0000 mg | ORAL_TABLET | Freq: Every day | ORAL | Status: DC

## 2018-12-19 NOTE — Progress Notes (Addendum)
Progress Note  Patient Name: Cynthia Frank Date of Encounter: 12/19/2018  Primary Cardiologist: Dr. Aundra Dubin EP : Dr. Lovena Le   Subjective   Pt doing well after surgery. Only pain is right upper shoulder. Denies chest pain or SOB.   Inpatient Medications    Scheduled Meds: . acetaminophen  500 mg Oral Q6H  . amiodarone  200 mg Oral Daily  . apixaban  2.5 mg Oral BID  . atorvastatin  40 mg Oral Daily  . citalopram  20 mg Oral Daily  . docusate sodium  100 mg Oral BID  . ketorolac  7.5 mg Intravenous Q6H  . levothyroxine  25 mcg Oral QAC breakfast  . magnesium oxide  400 mg Oral BID  . metoprolol succinate  25 mg Oral BID  . senna  1 tablet Oral BID   Continuous Infusions:  PRN Meds: acetaminophen, HYDROcodone-acetaminophen, HYDROcodone-acetaminophen, menthol-cetylpyridinium **OR** phenol, metoCLOPramide **OR** metoCLOPramide (REGLAN) injection, morphine injection, ondansetron (ZOFRAN) IV, ondansetron **OR** ondansetron (ZOFRAN) IV, zolpidem   Vital Signs    Vitals:   12/18/18 1530 12/18/18 1602 12/18/18 2022 12/19/18 0445  BP: 109/65 100/62 114/70 106/81  Pulse: 60 61 60 (!) 59  Resp: 18 18 18 18   Temp: 97.6 F (36.4 C) (!) 97.5 F (36.4 C) 97.8 F (36.6 C) 97.8 F (36.6 C)  TempSrc:  Oral Oral Oral  SpO2: 93% 96% 96% 98%  Weight:      Height:        Intake/Output Summary (Last 24 hours) at 12/19/2018 1005 Last data filed at 12/19/2018 0600 Gross per 24 hour  Intake 2437.8 ml  Output 1525 ml  Net 912.8 ml   Filed Weights   12/16/18 2026  Weight: 64 kg    Physical Exam   General: Well developed, well nourished, NAD Neck: Negative for carotid bruits. No JVD Lungs:Clear to ausculation bilaterally. No wheezes, rales, or rhonchi. Breathing is unlabored. Cardiovascular: RRR with S1 S2. No murmur Abdomen: Soft, non-tender, non-distended. No obvious abdominal masses. Extremities: No edema. No clubbing or cyanosis. DP pulses 1+ bilaterally Neuro: Alert  and oriented. No focal deficits. No facial asymmetry. MAE spontaneously. Psych: Responds to questions appropriately with normal affect.    Labs    Chemistry Recent Labs  Lab 12/16/18 0727 12/17/18 1257 12/18/18 0432 12/19/18 0418  NA 135 133* 131* 133*  K 3.3* 3.9 3.2* 5.2*  CL 96* 103 103 108  CO2 27 22 22  19*  GLUCOSE 102* 104* 206* 126*  BUN 21 20 13 14   CREATININE 0.97 1.12* 0.95 0.94  CALCIUM 8.7* 8.2* 7.8* 7.9*  PROT 6.6  --   --   --   ALBUMIN 4.1  --   --   --   AST 71*  --   --   --   ALT 66*  --   --   --   ALKPHOS 56  --   --   --   BILITOT 1.3*  --   --   --   GFRNONAA 55* 46* 57* 57*  GFRAA >60 54* >60 >60  ANIONGAP 12 8 6 6      Hematology Recent Labs  Lab 12/17/18 0437 12/18/18 0432 12/19/18 0418  WBC 7.3 6.8 11.9*  RBC 3.46* 3.22* 3.18*  HGB 11.0* 10.2* 10.1*  HCT 33.6* 31.6* 32.1*  MCV 97.1 98.1 100.9*  MCH 31.8 31.7 31.8  MCHC 32.7 32.3 31.5  RDW 13.2 13.2 13.4  PLT 108* 113* 110*    Cardiac  EnzymesNo results for input(s): TROPONINI in the last 168 hours. No results for input(s): TROPIPOC in the last 168 hours.   BNPNo results for input(s): BNP, PROBNP in the last 168 hours.   DDimer No results for input(s): DDIMER in the last 168 hours.   Radiology    Dg Pelvis Portable  Result Date: 12/18/2018 CLINICAL DATA:  Hip replacement EXAM: PORTABLE PELVIS 1-2 VIEWS COMPARISON:  12/18/2018 FINDINGS: The patient has undergone total hip arthroplasty on the right. There are expected postsurgical changes. Alignment appears near anatomic. There are advanced degenerative changes of the left hip. IMPRESSION: Status post total hip arthroplasty on the right. Electronically Signed   By: Constance Holster M.D.   On: 12/18/2018 16:40   Dg C-arm 1-60 Min-no Report  Result Date: 12/18/2018 Fluoroscopy was utilized by the requesting physician.  No radiographic interpretation.   Dg Hip Operative Unilat With Pelvis Right  Result Date: 12/18/2018 CLINICAL DATA:   Right anterior total hip arthroplasty. EXAM: OPERATIVE RIGHT HIP (WITH PELVIS IF PERFORMED) 2 VIEWS TECHNIQUE: Fluoroscopic spot image(s) were submitted for interpretation post-operatively. COMPARISON:  Pelvic CT 03/07/2009. FINDINGS: Spot fluoroscopic AP images of the right hip and lower pelvis demonstrate interval right total hip arthroplasty. There is a proximal femoral cerclage wire. No displaced fracture, dislocation or hardware malpositioning identified. IMPRESSION: No demonstrated complication following right total hip arthroplasty. Electronically Signed   By: Richardean Sale M.D.   On: 12/18/2018 13:36   Telemetry    12/19/2018 NSR HR 70-90's - Personally Reviewed  ECG    No new tracing since 12/10/2018- Personally Reviewed  Cardiac Studies   Echo 12/16/18 Sonographer Comments: Suboptimal apical window. Restricted mobility. Patient has broken hip could not turn. IMPRESSIONS  1. The left ventricle has mildly reduced systolic function, with an ejection fraction of 45-50%. The cavity size was normal. Left ventricular diastolic Doppler parameters are indeterminate. No evidence of left ventricular regional wall motion  abnormalities. 2. The right ventricle has normal systolic function. The cavity was normal. There is no increase in right ventricular wall thickness. Right ventricular systolic pressure is moderately elevated. 3. Left atrial size was mildly dilated. 4. There is mild mitral annular calcification present. 5. Tricuspid valve regurgitation is mild-moderate. 6. The aorta is normal unless otherwise noted. 7. Ventricular pacing noted. No visible A wave on mitral inflow or on mitral annulus tissue Doppler suggests atrial fibrillation at the time of the study (or atrial mechanical failure). This limits the accuracy of diastolic function assessment. 8. When compared to the prior study: 12/16/2015, there is improved left ventricular systolic function due to improved  lateral-septal synchrony by CRT pacing. Despite this, there is worsened pulmonary artery hypertension.  FINDINGS Left Ventricle: The left ventricle has mildly reduced systolic function, with an ejection fraction of 45-50%. The cavity size was normal. There is no increase in left ventricular wall thickness. Left ventricular diastolic Doppler parameters are  indeterminate. No evidence of left ventricular regional wall motion abnormalities..  Right Ventricle: The right ventricle has normal systolic function. The cavity was normal. There is no increase in right ventricular wall thickness. Right ventricular systolic pressure is moderately elevated. Pacing wire/catheter visualized in the right  ventricle.  Left Atrium: Left atrial size was mildly dilated.  Right Atrium: Right atrial size was normal in size.  Interatrial Septum: No atrial level shunt detected by color flow Doppler.  Pericardium: There is no evidence of pericardial effusion.  Mitral Valve: The mitral valve is normal in structure. There is mild  mitral annular calcification present. Mitral valve regurgitation is mild by color flow Doppler.  Tricuspid Valve: The tricuspid valve is normal in structure. Tricuspid valve regurgitation is mild-moderate by color flow Doppler.  Aortic Valve: The aortic valve is normal in structure. Aortic valve regurgitation is trivial by color flow Doppler.  Pulmonic Valve: The pulmonic valve was normal in structure. Pulmonic valve regurgitation is not visualized by color flow Doppler.  Aorta: The aorta is normal unless otherwise noted.  Venous: The inferior vena cava is normal in size with greater than 50% respiratory variability.  Compared to previous exam: 12/16/2015, there is improved left ventricular systolic function due to improved lateral-septal synchrony by CRT pacing. Despite this, there is worsened pulmonary artery hypertension.  Patient Profile     83 y.o. female a h/o  nonischemic CM, chronic systolic dysfunction, and atrial fibrillation s/p BiV ICD implantation by Dr Lovena Le in 2017 with improved LV function. Frequent episodes of symptomatic hypotension in which she sustained 4 episodes of syncope which she attributes to low BP. Her last episode occurred in February. Upon falling 11/12/17, she had a transverse process fracture of C5.   Assessment & Plan    1. Preoperative evaluation: -Pt noted to at increased risk for surgery given reduced EF however, this is not reversible and not likely to improve with further CV procedures/ management. Underwent procedure 12/18/2018>>right total hip arthroplasty    2. Paroxysmal atrial fibrillation: -AV paced on telemetry -Has [employer] ICD in place -Eliquis 2.5mg  resumed per surgical team -Continue Amiodarone 200mg  QD, Toprol 25 -CHA2DS2VASc =at least 5   3. Chronic systolic dysfunction/nonischemic cardiomyopathy: -Entresto on hold>>has had 5 episodes of syncope prior to hospital admission in the setting of hypotension  -BP today, low normal at 106/81>114/70>100/62 -Dr. Rayann Heman to see in device clinic after d/c to optimize CRT -Net + 1.9L>>.does not appear to be fluid volume overloaded on exam   4. Syncope: -Likely in the setting of hypotension>>Entresto on hold   -[employer] BiV ICD last interrogated 12/10/2018 with thresholds and sensing consistent with previous device measurements. Lead impedance trends stable over time. AT/AF burden 3.5%.AT/AF episodes appear to be known AF, + Eliquis.. No ventricular arrhythmia episodes recorded. Patient bi-ventricularly pacing 98.7% of the time. Device programmed with appropriate safety margins. Heart failure diagnostics reviewed and trends are stable for patient. No changes made this session.  5. Hyperkalemia: -K+, 5.2 today>>monitor closely. May need Kayexalte if too elevated -No arrhthymias on tele   Signed, Kathyrn Drown NP-C [place] Pager: [phone] 12/19/2018,  10:05 AM     For questions or updates, please contact   Please consult [email or web address] for contact info under Cardiology/STEMI.  Patient seen and examined.  Agree with above documentation.  She is doing well following her hip surgery.  Denies any pain or shortness of breath.  On exam, she is alert and oriented, lungs are clear to auscultation bilaterally, no lower extremity edema or JVD, regular rate and rhythm, no murmurs.  Telemetry personally reviewed, which shows a-paced, V-paced rhythm with rate in 60s.  She was noted to have low blood pressure upon standing, down to SBP 80.  Will decrease Toprol-XL to 25 mg daily.  Continue to hold Entresto.

## 2018-12-19 NOTE — TOC Initial Note (Signed)
Transition of Care [facility]) - Initial/Assessment Note    Patient Details  Name: Cynthia Frank MRN: [id number] Date of Birth: [birth year]  Transition [facility]) CM/SW Contact:    Wende Neighbors, LCSW Phone Number: 12/19/2018, 12:53 PM  Clinical Narrative:  CSW spoke with patient and spouse at bedside. Patient stated she is agreeable with having HHPT in the home. Spouse stated they have all the DMEs (3 in 1, rolling walker)  patient will need in the home. CSW spoke with [facility] and they stated they will be able to follow patient for HHPT once discharge                   Expected Discharge Plan: [place] Barriers to Discharge: Continued Medical Work up   Patient Goals and CMS Choice Patient states their goals for this hospitalization and ongoing recovery are:: to be home CMS Medicare.gov Compare Post Acute Care list provided to:: Patient Choice offered to / list presented to : Patient, Spouse  Expected Discharge Plan and Services Expected Discharge Plan: [place] In-house Referral: Clinical Social Work   Post Acute Care Choice: [place] arrangements for the past 2 months: Apartment                           HH Arranged: PT [place]: [place] Date [place]: 12/19/18 Time [place]: 1252 Representative spoke with at [place]: cory  Prior Living Arrangements/Services Living arrangements for the past 2 months: Apartment Lives with:: Self, Spouse Patient language and need for interpreter reviewed:: Yes Do you feel safe going back to the place where you live?: Yes      Need for Family Participation in Patient Care: Yes (Comment) Care giver support system in place?: No (comment)   Criminal Activity/Legal Involvement Pertinent to Current Situation/Hospitalization: No - Comment as needed  Activities of Daily Living Home Assistive Devices/Equipment: Eyeglasses, Built-in shower seat, Cane  (specify quad or straight), Hearing aid, Grab bars around toilet, Grab bars in shower(straight) ADL Screening (condition at time of admission) Patient's cognitive ability adequate to safely complete daily activities?: Yes Is the patient deaf or have difficulty hearing?: Yes Does the patient have difficulty seeing, even when wearing glasses/contacts?: No Does the patient have difficulty concentrating, remembering, or making decisions?: No Patient able to express need for assistance with ADLs?: Yes Does the patient have difficulty dressing or bathing?: No Independently performs ADLs?: Yes (appropriate for developmental age) Does the patient have difficulty walking or climbing stairs?: Yes Weakness of Legs: Right(R hip fx) Weakness of Arms/Hands: None  Permission Sought/Granted Permission sought to share information with : Family Supports Permission granted to share information with : Yes, Verbal Permission Granted  Share Information with NAME: Diffe Macnamara     Permission granted to share info w Relationship: spouse  Permission granted to share info w Contact Information: [phone]  Emotional Assessment Appearance:: Appears stated age Attitude/Demeanor/Rapport: Engaged Affect (typically observed): Accepting Orientation: : Oriented to Self, Oriented to Place, Oriented to  Time, Oriented to Situation Alcohol / Substance Use: Not Applicable Psych Involvement: No (comment)  Admission diagnosis:  Femoral fracture ([place]) [S72.90XA] Patient Active Problem List   Diagnosis Date Noted  . Syncope and collapse   . Closed right hip fracture ([place]) 12/16/2018  . Hypokalemia 12/16/2018  . Dysphoric mood 08/11/2017  . Insomnia 04/21/2016  . Arthritis 04/21/2016  . Chronic  systolic CHF (congestive heart failure) ([place]) 06/03/2015  . GERD (gastroesophageal reflux disease) 04/21/2015  . Personal history of colon cancer   . Routine general medical examination at a health care facility 03/24/2015   . Nonischemic cardiomyopathy ([place])   . Coronary artery disease   . Hyperlipidemia   . Atrial fibrillation ([place]) 12/11/2012  . THYROID NODULE 04/27/2007  . Essential hypertension 04/27/2007   PCP:  [name], MD Pharmacy:   [facility] [place], [place] [address] Phone: [phone] Fax: [phone]  [facility] [address], [place] [address] Phone: [phone] Fax: [place], [place] [id number] [place] [address] Phone: [phone] Fax: [phone]     Social Determinants of Health (SDOH) Interventions    Readmission Risk Interventions No flowsheet data found.

## 2018-12-19 NOTE — Plan of Care (Signed)

## 2018-12-19 NOTE — Care Management Important Message (Signed)
Important Message  Patient Details IM Letter given to Rhea Pink SW to present to the Patient Name: Cynthia Frank MRN: [id number] Date of Birth: 02/20/1936   Medicare Important Message Given:  Yes     Kerin Salen 12/19/2018, 11:22 AM

## 2018-12-19 NOTE — Progress Notes (Signed)
Pt's foley continued to have minimal drainage throughout the night. Pt denies the urge to void. Bladder scan shows 0cc. Pt's bedpad noted to be saturated. Per orders, foley to be d/c'd on POD #1. Foley taken out and purwick in place. Pt able to be weaned off of O2. She is A&O x4 and denies pain at this time. Attempted to call ortho tech to have trapeze bar placed on pt bed but unable to get a response. Will continue to monitor.

## 2018-12-19 NOTE — Anesthesia Postprocedure Evaluation (Signed)
Anesthesia Post Note  Patient: Cynthia Frank  Procedure(s) Performed: TOTAL HIP ARTHROPLASTY ANTERIOR APPROACH (Right Hip)     Patient location during evaluation: PACU Anesthesia Type: General Level of consciousness: awake and alert Pain management: pain level controlled Vital Signs Assessment: post-procedure vital signs reviewed and stable Respiratory status: spontaneous breathing, nonlabored ventilation, respiratory function stable and patient connected to nasal cannula oxygen Cardiovascular status: blood pressure returned to baseline and stable Postop Assessment: no apparent nausea or vomiting Anesthetic complications: no    Last Vitals:  Vitals:   12/18/18 2022 12/19/18 0445  BP: 114/70 106/81  Pulse: 60 (!) 59  Resp: 18 18  Temp: 36.6 C 36.6 C  SpO2: 96% 98%    Last Pain:  Vitals:   12/19/18 0445  TempSrc: Oral  PainSc:                  Meilyn Heindl L Lanay Zinda

## 2018-12-19 NOTE — Evaluation (Addendum)
Physical Therapy Evaluation Patient Details Name: Cynthia Frank MRN: [id number] DOB: [birth year] Today's Date: 12/19/2018   History of Present Illness  Pt ws at her [place] and fell after syncopal episode and fractured R femoral head, transferred her to [facility] and now s/p R DATHA with [facility]. Pt with history of pacemaker, a-fib , ankle fracture.  Clinical Impression  Pt tolerated session very well. She is moving her RLE very well began educating with R LE gentle exercises and mobility with RW. Pt was more limited by BPs decreasing than mobility. Concerns are more for BPs at this time. Will continue to follow and work with education with HEP and stair training for DC home.     Follow Up Recommendations Home health PT    Equipment Recommendations  Other (comment)(pt states she has a RW  ..she will need one)    Recommendations for Other Services       Precautions / Restrictions Precautions Precautions: Fall Restrictions RLE Weight Bearing: Weight bearing as tolerated      Mobility  Bed Mobility   Min A to Min guard for bed mobility for cues and sequencing . Supine to sit .                 Transfers Min A to Min guard for sit to stand at RW. Cues for RW safety use and sequencing.                     Ambulation/Gait Ambulated to /from bathroom 10 feet Min guard while assessing BP along the way. Pt tolerated well Ambulating with RW and min Guard, step to pattern, very little to no pain in her hip. Limited distant today only due to drop in BP while mobilizing, see vitals.                 Stairs  n/t                       Pertinent Vitals/Pain Pain Assessment: 0-10 Pain Score: 1  Pain Location: R hip sore, but most pain reported from Right posterior trap area around neck from " laying in the bed" heat applied to neck and ice to teh hip area Pain Descriptors / Indicators: Sore Pain Intervention(s): Monitored during session;Ice applied     Home Living Family/patient expects to be discharged to:: Private residence Living Arrangements: Spouse/significant other Available Help at Discharge: Family Type of Home: House Home Access: Stairs to enter   Technical brewer of Steps: 1 Home Layout: Two level Home Equipment: Environmental consultant - 2 wheels Additional Comments: pt wants to DC home with husband assisting    Prior Function Level of Independence: Independent         Comments: pt independent but states she has had some ongoing syncope episodes and MD has been trying to work on it. She gets lightheaded and passes out.     Hand Dominance        Extremity/Trunk Assessment        Lower Extremity Assessment Lower Extremity Assessment: Overall WFL for tasks assessed;RLE deficits/detail RLE Deficits / Details: R LE moves fairly well even after surgery, amazing       Communication   Communication: No difficulties  Cognition Arousal/Alertness: Awake/alert Behavior During Therapy: WFL for tasks assessed/performed Overall Cognitive Status: Within Functional Limits for tasks assessed  General Comments      Exercises Total Joint Exercises Ankle Circles/Pumps: AROM;Both;Supine;10 reps Quad Sets: AROM;Supine;Right;10 reps Heel Slides: AROM;Supine;Right;10 reps Hip ABduction/ADduction: AROM;Right;Supine;10 reps   Assessment/Plan    PT Assessment Patient needs continued PT services  PT Problem List Decreased strength;Decreased activity tolerance;Decreased mobility       PT Treatment Interventions Gait training;DME instruction;Stair training;Therapeutic activities;Functional mobility training;Therapeutic exercise;Patient/family education    PT Goals (Current goals can be found in the Care Plan section)  Acute Rehab PT Goals Patient Stated Goal: I want to be able to go home PT Goal Formulation: With patient/family Time For Goal Achievement:  01/02/19 Potential to Achieve Goals: Good    Frequency Min 5X/week    AM-PAC PT "6 Clicks" Mobility  Outcome Measure Help needed turning from your back to your side while in a flat bed without using bedrails?: A Little Help needed moving from lying on your back to sitting on the side of a flat bed without using bedrails?: A Little Help needed moving to and from a bed to a chair (including a wheelchair)?: A Little Help needed standing up from a chair using your arms (e.g., wheelchair or bedside chair)?: A Little Help needed to walk in hospital room?: A Little Help needed climbing 3-5 steps with a railing? : A Little 6 Click Score: 18    End of Session Equipment Utilized During Treatment: Gait belt Activity Tolerance: Patient tolerated treatment well Patient left: in chair;with call bell/phone within reach;with family/visitor present(educated pt to not get up unless nursing with her. She agreed and understaood how to call for help. declined chair alarm) Nurse Communication: Mobility status PT Visit Diagnosis: Other abnormalities of gait and mobility (R26.89)    Time: [id number] PT Time Calculation (min) (ACUTE ONLY): 56 min   Charges:   PT Evaluation $PT Eval Low Complexity: 1 Low PT Treatments $Gait Training: 8-22 mins $Therapeutic Activity: 8-22 mins        Clide Dales, PT Acute Rehabilitation Services Pager: [phone] Office: [phone] 12/19/2018   Clide Dales 12/19/2018, 1:46 PM

## 2018-12-19 NOTE — Progress Notes (Signed)
PROGRESS NOTE  Cynthia Frank [id number] DOB: 1936-04-03 DOA: 12/16/2018 PCP: [name], MD   LOS: 3 days   Brief narrative: Cynthia Frank is a 83 y.o. female with medical history significant of non ischemic cardiomyopathy, s/p ICD, chronic systolic heart failure,  CAD, atrial fibrillation, hypertension, hyperlipidemia,  GERD, [place] and walking in the house , passed out and fell on her right side. Her husband witnessed the fall. She lost consciousness for a few seconds and started having pain in the right leg and right hip.  She was seen at the local ED [facility], was found to have proximal hip fracture,. She requested to be transferred to [facility] hospital for hip repair.  X-ray of the right hip with pelvis shows right sub capital hip fracture. She was admitted to medical service and [facility] consulted for evaluation of right hip fracture.  Assessment/Plan:  Principal Problem:   Closed right hip fracture (HCC) Active Problems:   Essential hypertension   Atrial fibrillation (HCC)   Nonischemic cardiomyopathy (HCC)   Hyperlipidemia   GERD (gastroesophageal reflux disease)   Chronic systolic CHF (congestive heart failure) (HCC)   Hypokalemia   Syncope and collapse  Closed right hip fracture status post syncope. Orthopedics on board.  Status post right total hip arthroplasty on 12/18/2018.  Management as per orthopedic team.  Also recommended ambulation with walker.  Syncope, h/o Non ischemic cardiomyopathy Seen by cardiology.   Repeat 2D echocardiogram from 12/16/2018 showed ejection fraction of 45 to 50%.  Patient's spouse is concerned about passing out spells again.  Would appreciate cardiology to address medications on discharge.  Chronic systolic heart failure: Appears compensated at this time. Lasix and spironolactone on hold.  Patient still borderline low blood pressure.  Last Echo from [id number]  shows systolic  function was moderately to severely reduced in the range of 30% to 35%. Diffuse hypokinesis. Doppler parameters are consistent with high ventricular filling pressure.  Repeat 2D echocardiogram from 12/16/2018 showed ejection fraction of 45 to 50%.  Paroxysmal atrial fibrillation; status post AICD 06/2015. Continue amiodarone and toprol.  EKG with paced rhythm. eliquis on hold.  Has been resumed on Eliquis.  Hypothyroidism:  Continue Synthroid.   Essential hypertension: Borderline low blood pressure at this time.  Continue to hold antihypertensive.  Resume Lasix, aspirin spironolactone after surgery depending on clinical course.  Hypokalemia: Resolved.  Potassium of 5.2.  Hyperlipidemia:   lipitor   GERD; Stable.   VTE Prophylaxis: On Eliquis..   Code Status: Full code  Family Communication: I spoke with the patient's spouse on the phone and updated him about the clinical condition of the patient.  Disposition Plan:  PT recommended home health PT. Likely home with home health PT in 1 to 2 days.  Follow cardiology recommendations.  Consultants:  Orthopedics, cardiology  Procedures: Status post right total hip arthroplasty on 12/18/2018.   Antibiotics: Anti-infectives (From admission, onward)   Start     Dose/Rate Route Frequency Ordered Stop   12/18/18 1800  ceFAZolin (ANCEF) IVPB 2g/100 mL premix     2 g 200 mL/hr over 30 Minutes Intravenous Every 6 hours 12/18/18 1631 12/19/18 0044   12/18/18 1019  ceFAZolin (ANCEF) 2-4 GM/100ML-% IVPB    Note to Pharmacy: Marchia Meiers   : cabinet override      12/18/18 1019 12/18/18 2229   12/17/18 0645  ceFAZolin (ANCEF) IVPB 2g/100 mL premix  Status:  Discontinued  2 g 200 mL/hr over 30 Minutes Intravenous On call to O.R. 12/17/18 [id number] 12/18/18 0559   12/17/18 0645  ceFAZolin (ANCEF) 3 g in dextrose 5 % 50 mL IVPB  Status:  Discontinued     3 g 100 mL/hr over 30 Minutes Intravenous On call to O.R. 12/17/18 [id number] 12/17/18  0756     Subjective: Patient denies shortness of breath, no chest pain. No overt pain in the hip. No fever, chills or rigor.  Complains of mild pain on the shoulder site.  Objective: Vitals:   12/18/18 2022 12/19/18 0445  BP: 114/70 106/81  Pulse: 60 (!) 59  Resp: 18 18  Temp: 97.8 F (36.6 C) 97.8 F (36.6 C)  SpO2: 96% 98%    Intake/Output Summary (Last 24 hours) at 12/19/2018 0739 Last data filed at 12/19/2018 0600 Gross per 24 hour  Intake 2437.8 ml  Output 1525 ml  Net 912.8 ml   Filed Weights   12/16/18 2026  Weight: 64 kg   Body mass index is 23.48 kg/m.   Physical Exam: General:  Average built, not in obvious distress HENT: Normocephalic, pupils equally reacting to light and accommodation.  No scleral pallor or icterus noted. Oral mucosa is moist.  Chest:  Clear breath sounds.  Diminished breath sounds bilaterally. No crackles or wheezes.  Chest wall AICD in place. CVS: S1 &S2 heard. No murmur.  Irregular rhythm. Abdomen: Soft, nontender, nondistended.  Bowel sounds are heard.  Liver is not palpable, no abdominal mass palpated Extremities: Surgical site clean dry and intact- right hip. Psych: Alert, awake and oriented, normal mood CNS:  No cranial nerve deficits.  Power equal in all extremities.  No sensory deficits noted.  No cerebellar signs.   Skin: Warm and dry.    Data Review: I have personally reviewed the following laboratory data and studies,  CBC: Recent Labs  Lab 12/16/18 0727 12/17/18 0437 12/18/18 0432 12/19/18 0418  WBC 8.0 7.3 6.8 11.9*  NEUTROABS 6.2  --   --   --   HGB 12.4 11.0* 10.2* 10.1*  HCT 37.4 33.6* 31.6* 32.1*  MCV 96.1 97.1 98.1 100.9*  PLT 130* 108* 113* [id number]*   Basic Metabolic Panel: Recent Labs  Lab 12/16/18 0727 12/17/18 1257 12/18/18 0432 12/19/18 0418  NA 135 133* 131* 133*  K 3.3* 3.9 3.2* 5.2*  CL 96* 103 103 108  CO2 27 22 22  19*  GLUCOSE 102* 104* 206* 126*  BUN 21 20 13 14   CREATININE 0.97 1.12* 0.95 0.94   CALCIUM 8.7* 8.2* 7.8* 7.9*  MG  --   --  2.0  --    Liver Function Tests: Recent Labs  Lab 12/16/18 0727  AST 71*  ALT 66*  ALKPHOS 56  BILITOT 1.3*  PROT 6.6  ALBUMIN 4.1   No results for input(s): LIPASE, AMYLASE in the last 168 hours. No results for input(s): AMMONIA in the last 168 hours. Cardiac Enzymes: No results for input(s): CKTOTAL, CKMB, CKMBINDEX, TROPONINI in the last 168 hours. BNP (last 3 results) No results for input(s): BNP in the last 8760 hours.  ProBNP (last 3 results) No results for input(s): PROBNP in the last 8760 hours.  CBG: Recent Labs  Lab 12/17/18 0745 12/18/18 1127  GLUCAP 106* 101*   Recent Results (from the past 240 hour(s))  SARS Coronavirus 2 [facility] order, Performed in [facility] hospital lab) Nasopharyngeal Nasal Mucosa     Status: None   Collection Time: 12/17/18  8:54 AM  Specimen: Nasal Mucosa; Nasopharyngeal  Result Value Ref Range Status   SARS Coronavirus 2 NEGATIVE NEGATIVE Final    Comment: (NOTE) If result is NEGATIVE SARS-CoV-2 target nucleic acids are NOT DETECTED. The SARS-CoV-2 RNA is generally detectable in upper and lower  respiratory specimens during the acute phase of infection. The lowest  concentration of SARS-CoV-2 viral copies this assay can detect is 250  copies / mL. A negative result does not preclude SARS-CoV-2 infection  and should not be used as the sole basis for treatment or other  patient management decisions.  A negative result may occur with  improper specimen collection / handling, submission of specimen other  than nasopharyngeal swab, presence of viral mutation(s) within the  areas targeted by this assay, and inadequate number of viral copies  (<250 copies / mL). A negative result must be combined with clinical  observations, patient history, and epidemiological information. If result is POSITIVE SARS-CoV-2 target nucleic acids are DETECTED. The SARS-CoV-2 RNA is generally detectable in  upper and lower  respiratory specimens dur ing the acute phase of infection.  Positive  results are indicative of active infection with SARS-CoV-2.  Clinical  correlation with patient history and other diagnostic information is  necessary to determine patient infection status.  Positive results do  not rule out bacterial infection or co-infection with other viruses. If result is PRESUMPTIVE POSTIVE SARS-CoV-2 nucleic acids MAY BE PRESENT.   A presumptive positive result was obtained on the submitted specimen  and confirmed on repeat testing.  While 2019 novel coronavirus  (SARS-CoV-2) nucleic acids may be present in the submitted sample  additional confirmatory testing may be necessary for epidemiological  and / or clinical management purposes  to differentiate between  SARS-CoV-2 and other Sarbecovirus currently known to infect humans.  If clinically indicated additional testing with an alternate test  methodology [phone]) is advised. The SARS-CoV-2 RNA is generally  detectable in upper and lower respiratory sp ecimens during the acute  phase of infection. The expected result is Negative. Fact Sheet for Patients:  StrictlyIdeas.no Fact Sheet for Healthcare Providers: BankingDealers.co.za This test is not yet approved or cleared by the [place] FDA and has been authorized for detection and/or diagnosis of SARS-CoV-2 by FDA under an Emergency Use Authorization (EUA).  This EUA will remain in effect (meaning this test can be used) for the duration of the COVID-19 declaration under Section 564(b)(1) of the Act, 21 U.S.C. section 360bbb-3(b)(1), unless the authorization is terminated or revoked sooner. Performed at [facility], [place] [address], [place], [place] [address]   Surgical pcr screen     Status: None   Collection Time: 12/17/18  8:54 AM   Specimen: Nasal Mucosa; Nasal Swab  Result Value Ref Range Status    MRSA, PCR NEGATIVE NEGATIVE Final   Staphylococcus aureus NEGATIVE NEGATIVE Final    Comment: (NOTE) The Xpert SA Assay (FDA approved for NASAL specimens in patients 34 years of age and older), is one component of a comprehensive surveillance program. It is not intended to diagnose infection nor to guide or monitor treatment. Performed at [facility], [place] [address] [place],  [address]      Studies: Dg Pelvis Portable  Result Date: 12/18/2018 CLINICAL DATA:  Hip replacement EXAM: PORTABLE PELVIS 1-2 VIEWS COMPARISON:  12/18/2018 FINDINGS: The patient has undergone total hip arthroplasty on the right. There are expected postsurgical changes. Alignment appears near anatomic. There are advanced degenerative changes of the left hip.  IMPRESSION: Status post total hip arthroplasty on the right. Electronically Signed   By: Constance Holster M.D.   On: 12/18/2018 16:40   Dg C-arm 1-60 Min-no Report  Result Date: 12/18/2018 Fluoroscopy was utilized by the requesting physician.  No radiographic interpretation.   Dg Hip Operative Unilat With Pelvis Right  Result Date: 12/18/2018 CLINICAL DATA:  Right anterior total hip arthroplasty. EXAM: OPERATIVE RIGHT HIP (WITH PELVIS IF PERFORMED) 2 VIEWS TECHNIQUE: Fluoroscopic spot image(s) were submitted for interpretation post-operatively. COMPARISON:  Pelvic CT 03/07/2009. FINDINGS: Spot fluoroscopic AP images of the right hip and lower pelvis demonstrate interval right total hip arthroplasty. There is a proximal femoral cerclage wire. No displaced fracture, dislocation or hardware malpositioning identified. IMPRESSION: No demonstrated complication following right total hip arthroplasty. Electronically Signed   By: Richardean Sale M.D.   On: 12/18/2018 13:36    Scheduled Meds: . acetaminophen  500 mg Oral Q6H  . amiodarone  200 mg Oral Daily  . apixaban  2.5 mg Oral BID  . atorvastatin  40 mg Oral Daily  . citalopram  20 mg  Oral Daily  . docusate sodium  100 mg Oral BID  . ketorolac  7.5 mg Intravenous Q6H  . levothyroxine  25 mcg Oral QAC breakfast  . magnesium oxide  400 mg Oral BID  . metoprolol succinate  25 mg Oral BID  . senna  1 tablet Oral BID    Continuous Infusions: . sodium chloride 75 mL/hr at 12/19/18 0600    Flora Lipps, MD  [facility] 12/19/2018

## 2018-12-19 NOTE — Progress Notes (Signed)
    Subjective:  Patient reports pain as mild.  Denies N/V/CP/SOB. No c/o.  Objective:   VITALS:   Vitals:   12/18/18 1530 12/18/18 1602 12/18/18 2022 12/19/18 0445  BP: 109/65 100/62 114/70 106/81  Pulse: 60 61 60 (!) 59  Resp: 18 18 18 18   Temp: 97.6 F (36.4 C) (!) 97.5 F (36.4 C) 97.8 F (36.6 C) 97.8 F (36.6 C)  TempSrc:  Oral Oral Oral  SpO2: 93% 96% 96% 98%  Weight:      Height:        NAD ABD soft Sensation intact distally Intact pulses distally Dorsiflexion/Plantar flexion intact Incision: dressing C/D/I Compartment soft   Lab Results  Component Value Date   WBC 11.9 (H) 12/19/2018   HGB 10.1 (L) 12/19/2018   HCT 32.1 (L) 12/19/2018   MCV 100.9 (H) 12/19/2018   PLT 110 (L) 12/19/2018   BMET    Component Value Date/Time   NA 133 (L) 12/19/2018 0418   NA 141 08/17/2011 0807   K 5.2 (H) 12/19/2018 0418   K 4.2 08/17/2011 0807   CL 108 12/19/2018 0418   CL 94 (L) 08/17/2011 0807   CO2 19 (L) 12/19/2018 0418   CO2 28 08/17/2011 0807   GLUCOSE 126 (H) 12/19/2018 0418   GLUCOSE 110 08/17/2011 0807   BUN 14 12/19/2018 0418   BUN 14 08/17/2011 0807   CREATININE 0.94 12/19/2018 0418   CREATININE 1.18 (H) 07/10/2015 1028   CALCIUM 7.9 (L) 12/19/2018 0418   CALCIUM 8.6 08/17/2011 0807   GFRNONAA 57 (L) 12/19/2018 0418   GFRAA >60 12/19/2018 0418     Assessment/Plan: 1 Day Post-Op   Principal Problem:   Closed right hip fracture (HCC) Active Problems:   Essential hypertension   Atrial fibrillation (HCC)   Nonischemic cardiomyopathy (HCC)   Hyperlipidemia   GERD (gastroesophageal reflux disease)   Chronic systolic CHF (congestive heart failure) (HCC)   Hypokalemia   Syncope and collapse   WBAT with walker DVT ppx: Eliquis 2.5 mg PO BID for 72 hrs, then resume 5 mg PO BID, SCDs, TEDS PO pain control PT/OT Dispo: D/C planning   Hilton Cork Aniyha Tate 12/19/2018, 10:19 AM   Rod Can, MD Cell: [phone] [facility]  is now [facility]  [place] [address], [place], [place] [address] Phone: [phone] [email or web address] Facebook  [employer]

## 2018-12-19 NOTE — Progress Notes (Signed)
During MD rounds orders received to discontinued IVF. SRP, RN

## 2018-12-20 DIAGNOSIS — I951 Orthostatic hypotension: Secondary | ICD-10-CM

## 2018-12-20 LAB — BASIC METABOLIC PANEL
Anion gap: 6 (ref 5–15)
BUN: 22 mg/dL (ref 8–23)
CO2: 20 mmol/L — ABNORMAL LOW (ref 22–32)
Calcium: 7.9 mg/dL — ABNORMAL LOW (ref 8.9–10.3)
Chloride: 100 mmol/L (ref 98–111)
Creatinine, Ser: 1.1 mg/dL — ABNORMAL HIGH (ref 0.44–1.00)
GFR calc Af Amer: 55 mL/min — ABNORMAL LOW (ref >60)
GFR calc non Af Amer: 47 mL/min — ABNORMAL LOW (ref >60)
Glucose, Bld: 100 mg/dL — ABNORMAL HIGH (ref 70–99)
Potassium: 4.6 mmol/L (ref 3.5–5.1)
Sodium: 126 mmol/L — ABNORMAL LOW (ref 135–145)

## 2018-12-20 LAB — CBC
HCT: 26 % — ABNORMAL LOW (ref 36.0–46.0)
Hemoglobin: 8.6 g/dL — ABNORMAL LOW (ref 12.0–15.0)
MCH: 31.9 pg (ref 26.0–34.0)
MCHC: 33.1 g/dL (ref 30.0–36.0)
MCV: 96.3 fL (ref 80.0–100.0)
Platelets: 106 10*3/uL — ABNORMAL LOW (ref 150–400)
RBC: 2.7 MIL/uL — ABNORMAL LOW (ref 3.87–5.11)
RDW: 13.4 % (ref 11.5–15.5)
WBC: 10 10*3/uL (ref 4.0–10.5)
nRBC: 0.2 % (ref 0.0–0.2)

## 2018-12-20 MED ORDER — TRAZODONE HCL 50 MG PO TABS
50.0000 mg | ORAL_TABLET | Freq: Once | ORAL | Status: AC
  Administered 2018-12-20: 50 mg via ORAL
  Filled 2018-12-20: qty 1

## 2018-12-20 MED ORDER — METOPROLOL SUCCINATE ER 25 MG PO TB24
12.5000 mg | ORAL_TABLET | Freq: Every day | ORAL | Status: DC
  Administered 2018-12-20: 12.5 mg via ORAL
  Administered 2018-12-21: 11:00:00 via ORAL
  Filled 2018-12-20 (×3): qty 1

## 2018-12-20 NOTE — Progress Notes (Signed)
Physical Therapy Treatment Patient Details Name: Cynthia Frank MRN: [id number] DOB: 1936/03/19 Today's Date: 12/20/2018    History of Present Illness Pt ws at her [place] and fell after syncopal episode and fractured R femoral head, transferred her to [facility] and now s/p R DATHA with WBAT. Pt with history of pacemaker, a-fib , ankle fracture.    PT Comments    Pt tolerated 70+70 ft ambulation well, and performs supine/sitting THA exercises proficiently with min verbal cuing. Pt also proficiently navigates stairs. Pt's biggest issue at present is symptomatic orthostatic hypotension (see below). Pt spent several minutes explaining to pt safety with orthostatic hypotension, including taking several minutes to rest at each change of position, to sit and recline if pt feels weak/dizzy/lightheaded, to check her BP daily and with activity, to pump UEs if feeling dizzy sitting EOB to increase venous return, and to take rest breaks as needed during the day. Pt understands, and agrees to techniques at home. Pt also states she will stay on first floor to minimize risk of getting dizzy on flight of stairs. Pt's husband present during session and very supportive.   BP, HR standing pre-ambulation: 95/73, 61 bpm BP, HR standing post-ambulation: 77/56, 82 bpm  BP, HR prior to stair training: 110/72, 59 bpm BP, HR post-stair training: 80/55, 80 bpm    Follow Up Recommendations  Home health PT     Equipment Recommendations  Other (comment)(pt states she has a RW  ..she will need one)    Recommendations for Other Services       Precautions / Restrictions Precautions Precautions: Fall Restrictions RLE Weight Bearing: Weight bearing as tolerated    Mobility  Bed Mobility Overal bed mobility: Needs Assistance             General bed mobility comments: pt up in chair upon PT arrival to room, requesting to stay up in chair.  Transfers Overall transfer level: Needs assistance Equipment  used: Rolling walker (2 wheeled) Transfers: Sit to/from Stand Sit to Stand: Min guard;From elevated surface         General transfer comment: min guard for safety, verbal cuing for hand placement when rising. No unsteadiness noted.  Ambulation/Gait Ambulation/Gait assistance: Min guard Gait Distance (Feet): 140 Feet(70+70 ft) Assistive device: Rolling walker (2 wheeled) Gait Pattern/deviations: Step-to pattern;Step-through pattern;Decreased stride length Gait velocity: decr   General Gait Details: min guard for safety, verbal cuing for placement in RW. Pt requiring seated rest break due to fatigue at 70 ft ambulation, pt reporting feeling weak. BP in assessment.   Stairs Stairs: Yes Stairs assistance: Min guard;Supervision Stair Management: One rail Right;Step to pattern;Forwards;With cane Number of Stairs: 3 General stair comments: verbal cuing for sequencing (up with good, down with bad leg); pt has a flight of steps to master bedroom but pt will be staying on first floor. Pt has 2 steps to enter home.   Wheelchair Mobility    Modified Rankin (Stroke Patients Only)       Balance Overall balance assessment: Mild deficits observed, not formally tested;History of Falls(fall due to syncope)                                          Cognition Arousal/Alertness: Awake/alert Behavior During Therapy: WFL for tasks assessed/performed Overall Cognitive Status: Within Functional Limits for tasks assessed  Exercises Total Joint Exercises Ankle Circles/Pumps: AROM;Both;10 reps;Seated Quad Sets: AROM;20 reps;Right;Seated Heel Slides: AROM;Right;15 reps;Seated Hip ABduction/ADduction: Right;10 reps;AAROM;Seated    General Comments        Pertinent Vitals/Pain Pain Assessment: 0-10 Pain Score: 3  Pain Location: R hip Pain Descriptors / Indicators: Sore Pain Intervention(s): Limited activity within  patient's tolerance;Monitored during session;Premedicated before session;Repositioned    Home Living                      Prior Function            PT Goals (current goals can now be found in the care plan section) Acute Rehab PT Goals Patient Stated Goal: I want to be able to go home PT Goal Formulation: With patient/family Time For Goal Achievement: 01/02/19 Potential to Achieve Goals: Good Progress towards PT goals: Progressing toward goals    Frequency    Min 5X/week      PT Plan Current plan remains appropriate    Co-evaluation              AM-PAC PT "6 Clicks" Mobility   Outcome Measure  Help needed turning from your back to your side while in a flat bed without using bedrails?: A Little Help needed moving from lying on your back to sitting on the side of a flat bed without using bedrails?: A Little Help needed moving to and from a bed to a chair (including a wheelchair)?: A Little Help needed standing up from a chair using your arms (e.g., wheelchair or bedside chair)?: A Little Help needed to walk in hospital room?: A Little Help needed climbing 3-5 steps with a railing? : A Little 6 Click Score: 18    End of Session Equipment Utilized During Treatment: Gait belt Activity Tolerance: Patient tolerated treatment well Patient left: in chair;with call bell/phone within reach;with family/visitor present(educated pt to not get up unless nursing with her. She agreed and understaood how to call for help. declined chair alarm) Nurse Communication: Mobility status PT Visit Diagnosis: Other abnormalities of gait and mobility (R26.89)     Time: [id number] PT Time Calculation (min) (ACUTE ONLY): 42 min  Charges:  $Gait Training: 23-37 mins $Therapeutic Exercise: 8-22 mins                    Julien Girt, PT Acute Rehabilitation Services Pager [phone]  Office [phone]   Cynthia Frank 12/20/2018, 1:43 PM

## 2018-12-20 NOTE — Progress Notes (Addendum)
Progress Note  Patient Name: Cynthia Frank Date of Encounter: 12/20/2018  Primary Cardiologist: Dr. Aundra Dubin EP : Dr. Lovena Le  Subjective   Feeling well today. Working with PT. Wants to go home.   Inpatient Medications    Scheduled Meds: . acetaminophen  500 mg Oral Q6H  . amiodarone  200 mg Oral Daily  . apixaban  2.5 mg Oral BID  . atorvastatin  40 mg Oral Daily  . citalopram  20 mg Oral Daily  . docusate sodium  100 mg Oral BID  . levothyroxine  25 mcg Oral QAC breakfast  . magnesium oxide  400 mg Oral BID  . metoprolol succinate  25 mg Oral Daily  . senna  1 tablet Oral BID   Continuous Infusions:  PRN Meds: acetaminophen, HYDROcodone-acetaminophen, HYDROcodone-acetaminophen, menthol-cetylpyridinium **OR** phenol, metoCLOPramide **OR** metoCLOPramide (REGLAN) injection, morphine injection, ondansetron (ZOFRAN) IV, ondansetron **OR** ondansetron (ZOFRAN) IV, zolpidem   Vital Signs    Vitals:   12/19/18 0445 12/19/18 1314 12/19/18 1949 12/20/18 0502  BP: 106/81 95/66 117/72 (!) 91/54  Pulse: (!) 59 61 61 60  Resp: 18 16 18 18   Temp: 97.8 F (36.6 C)  97.9 F (36.6 C) 98.1 F (36.7 C)  TempSrc: Oral  Oral Oral  SpO2: 98% 96% 100% 95%  Weight:      Height:        Intake/Output Summary (Last 24 hours) at 12/20/2018 0803 Last data filed at 12/19/2018 2003 Gross per 24 hour  Intake 100 ml  Output -  Net 100 ml   Filed Weights   12/16/18 2026  Weight: 64 kg    Physical Exam   General: Well developed, well nourished, NAD Lungs:Clear to ausculation bilaterally. No wheezes, rales, or rhonchi. Breathing is unlabored. Cardiovascular: AV paced, RRR with S1 S2. No murmur Abdomen: Soft, non-tender, non-distended. No obvious abdominal masses. Extremities: No edema. No clubbing or cyanosis. DP pulses 2+ bilaterally Neuro: Alert and oriented. No focal deficits. No facial asymmetry. MAE spontaneously. Psych: Responds to questions appropriately with normal  affect.    Labs    Chemistry Recent Labs  Lab 12/16/18 [phone]  12/18/18 0432 12/19/18 0418 12/20/18 0430  NA 135   < > 131* 133* 126*  K 3.3*   < > 3.2* 5.2* 4.6  CL 96*   < > 103 108 100  CO2 27   < > 22 19* 20*  GLUCOSE 102*   < > 206* 126* 100*  BUN 21   < > 13 14 22   CREATININE 0.97   < > 0.95 0.94 1.10*  CALCIUM 8.7*   < > 7.8* 7.9* 7.9*  PROT 6.6  --   --   --   --   ALBUMIN 4.1  --   --   --   --   AST 71*  --   --   --   --   ALT 66*  --   --   --   --   ALKPHOS 56  --   --   --   --   BILITOT 1.3*  --   --   --   --   GFRNONAA 55*   < > 57* 57* 47*  GFRAA >60   < > >60 >60 55*  ANIONGAP 12   < > 6 6 6    < > = values in this interval not displayed.     Hematology Recent Labs  Lab 12/18/18 [id number] 12/19/18 0418 12/20/18  0430  WBC 6.8 11.9* 10.0  RBC 3.22* 3.18* 2.70*  HGB 10.2* 10.1* 8.6*  HCT 31.6* 32.1* 26.0*  MCV 98.1 100.9* 96.3  MCH 31.7 31.8 31.9  MCHC 32.3 31.5 33.1  RDW 13.2 13.4 13.4  PLT 113* 110* 106*    Cardiac EnzymesNo results for input(s): TROPONINI in the last 168 hours. No results for input(s): TROPIPOC in the last 168 hours.   BNPNo results for input(s): BNP, PROBNP in the last 168 hours.   DDimer No results for input(s): DDIMER in the last 168 hours.   Radiology    Dg Pelvis Portable  Result Date: 12/18/2018 CLINICAL DATA:  Hip replacement EXAM: PORTABLE PELVIS 1-2 VIEWS COMPARISON:  12/18/2018 FINDINGS: The patient has undergone total hip arthroplasty on the right. There are expected postsurgical changes. Alignment appears near anatomic. There are advanced degenerative changes of the left hip. IMPRESSION: Status post total hip arthroplasty on the right. Electronically Signed   By: Constance Holster M.D.   On: 12/18/2018 16:40   Dg C-arm 1-60 Min-no Report  Result Date: 12/18/2018 Fluoroscopy was utilized by the requesting physician.  No radiographic interpretation.   Dg Hip Operative Unilat With Pelvis Right  Result Date: 12/18/2018  CLINICAL DATA:  Right anterior total hip arthroplasty. EXAM: OPERATIVE RIGHT HIP (WITH PELVIS IF PERFORMED) 2 VIEWS TECHNIQUE: Fluoroscopic spot image(s) were submitted for interpretation post-operatively. COMPARISON:  Pelvic CT 03/07/2009. FINDINGS: Spot fluoroscopic AP images of the right hip and lower pelvis demonstrate interval right total hip arthroplasty. There is a proximal femoral cerclage wire. No displaced fracture, dislocation or hardware malpositioning identified. IMPRESSION: No demonstrated complication following right total hip arthroplasty. Electronically Signed   By: Richardean Sale M.D.   On: 12/18/2018 13:36   Telemetry    12/20/2018 AV paced at 60bpm- Personally Reviewed  ECG    No new tracing as of 12/20/2018- Personally Reviewed  Cardiac Studies   Echo 12/16/18 Sonographer Comments: Suboptimal apical window. Restricted mobility. Patient has broken hip could not turn. IMPRESSIONS  1. The left ventricle has mildly reduced systolic function, with an ejection fraction of 45-50%. The cavity size was normal. Left ventricular diastolic Doppler parameters are indeterminate. No evidence of left ventricular regional wall motion  abnormalities. 2. The right ventricle has normal systolic function. The cavity was normal. There is no increase in right ventricular wall thickness. Right ventricular systolic pressure is moderately elevated. 3. Left atrial size was mildly dilated. 4. There is mild mitral annular calcification present. 5. Tricuspid valve regurgitation is mild-moderate. 6. The aorta is normal unless otherwise noted. 7. Ventricular pacing noted. No visible A wave on mitral inflow or on mitral annulus tissue Doppler suggests atrial fibrillation at the time of the study (or atrial mechanical failure). This limits the accuracy of diastolic function assessment. 8. When compared to the prior study: 12/16/2015, there is improved left ventricular systolic function due to  improved lateral-septal synchrony by CRT pacing. Despite this, there is worsened pulmonary artery hypertension.  FINDINGS Left Ventricle: The left ventricle has mildly reduced systolic function, with an ejection fraction of 45-50%. The cavity size was normal. There is no increase in left ventricular wall thickness. Left ventricular diastolic Doppler parameters are  indeterminate. No evidence of left ventricular regional wall motion abnormalities..  Right Ventricle: The right ventricle has normal systolic function. The cavity was normal. There is no increase in right ventricular wall thickness. Right ventricular systolic pressure is moderately elevated. Pacing wire/catheter visualized in the right  ventricle.  Left  Atrium: Left atrial size was mildly dilated.  Right Atrium: Right atrial size was normal in size.  Interatrial Septum: No atrial level shunt detected by color flow Doppler.  Pericardium: There is no evidence of pericardial effusion.  Mitral Valve: The mitral valve is normal in structure. There is mild mitral annular calcification present. Mitral valve regurgitation is mild by color flow Doppler.  Tricuspid Valve: The tricuspid valve is normal in structure. Tricuspid valve regurgitation is mild-moderate by color flow Doppler.  Aortic Valve: The aortic valve is normal in structure. Aortic valve regurgitation is trivial by color flow Doppler.  Pulmonic Valve: The pulmonic valve was normal in structure. Pulmonic valve regurgitation is not visualized by color flow Doppler.  Aorta: The aorta is normal unless otherwise noted.  Venous: The inferior vena cava is normal in size with greater than 50% respiratory variability.  Compared to previous exam: 12/16/2015, there is improved left ventricular systolic function due to improved lateral-septal synchrony by CRT pacing. Despite this, there is worsened pulmonary artery hypertension.  Patient Profile     83 y.o. female a  h/o nonischemic CM, chronic systolic dysfunction, and atrial fibrillation s/p BiV ICD implantation by Dr Lovena Le in 2017 with improved LV function. Frequent episodes of symptomatic hypotension in which she sustained 4 episodes of syncope which she attributes to low BP. Her last episode occurred in February. Upon falling 11/12/17, she had a transverse process fracture of C5.  Assessment & Plan    1. Preoperative evaluation: -Pt noted to at increased risk for surgery given reduced EF however, this is not reversible and not likely to improve with further CV procedures/ management. Underwent procedure 12/18/2018>>right total hip arthroplasty>>doing well in the post-op setting    2. Paroxysmal atrial fibrillation: -AV paced on telemetry -Medtronic ICD in place -Eliquis 2.5mg  resumed per surgical team>>increase to 5mg  on 09/05 per ortho notes  -Continue Amiodarone 200mg  QD -Reduce Toprol to 12.5 given low BP  -CHA2DS2VASc =at least 5   3. Chronic systolic dysfunction/nonischemic cardiomyopathy: -Continue to hold Entresto>>had 5 episodes of syncope prior to hospital admission in the setting of hypotension. Likely not a candidate for future re-start  -BP today>> low normal at 91/54>117/72>95/66>106/81  -Dr. Rayann Heman to see in device clinic after d/c to optimize CRT -Net + 2L>>does not appear to be fluid volume overloaded on exam   4. Syncope: -Likely in the setting of hypotension>>Entresto on hold   -Medtronic BiV ICD last interrogated 12/10/2018 with thresholds and sensing consistent with previous device measurements. Lead impedance trends stable over time. AT/AF burden 3.5%.AT/AF episodes appear to be known AF, + Eliquis. No ventricular arrhythmia episodes recorded. Patient bi-ventricularly pacing 98.7% of the time. Device programmed with appropriate safety margins. Heart failure diagnostics reviewed and trends are stable for patient. No changes made this session. -Likely not a future candidate  for Entresto re-start -Reduce Toprol to 12.5  5. Hyperkalemia: -K+, 4.6 today>>>improved from 09/02 -No arrhthymias on tele  Signed, Kathyrn Drown NP-C [place] Pager: [phone] 12/20/2018, 8:03 AM     For questions or updates, please contact   Please consult [email or web address] for contact info under Cardiology/STEMI.   Patient seen and examined.  Agree with above documentation.  On exam, patient is alert and oriented, lungs are clear to auscultation bilaterally, regular rate and rhythm, no murmurs, no lower extremity edema or JVD.  Telemetry personally reviewed, shows atrial paced/ventricular paced rhythm at heart rate 60.  She reports feeling well today.  Continues to have soft blood pressures.  She states that she feels tired particularly when standing, but denies feeling like she is going to pass out.  Given soft BPs, will decrease Toprol-XL to 12.5 mg daily.

## 2018-12-20 NOTE — Progress Notes (Signed)
Physical therapy working with patient. BP started 95/73 HR 61 and with ambulation BP 77/56 HR 82. Pt stated she felt weak. Physical Therapy informed RN. MD paged to make aware.

## 2018-12-20 NOTE — Progress Notes (Signed)
PROGRESS NOTE  SIMONA OVERMAN [id number] DOB: 1935-06-20 DOA: 12/16/2018 PCP: [name], MD   LOS: 4 days   Brief narrative: Cynthia Frank is a 83 y.o. female with medical history significant of non ischemic cardiomyopathy, s/p ICD, chronic systolic heart failure,  CAD, atrial fibrillation, hypertension, hyperlipidemia,  GERD, [place] and walking in the house , passed out and fell on her right side. Her husband witnessed the fall. She lost consciousness for a few seconds and started having pain in the right leg and right hip.  She was seen at the local ED [facility], was found to have proximal hip fracture,. She requested to be transferred to [facility] for hip repair.  X-ray of the right hip with pelvis shows right sub capital hip fracture. She was admitted to medical service and [facility] consulted for evaluation of right hip fracture.  Assessment/Plan:  Principal Problem:   Closed right hip fracture (HCC) Active Problems:   Essential hypertension   Atrial fibrillation (HCC)   Nonischemic cardiomyopathy (HCC)   Hyperlipidemia   GERD (gastroesophageal reflux disease)   Chronic systolic CHF (congestive heart failure) (HCC)   Hypokalemia   Syncope and collapse  Closed right hip fracture status post syncope. Orthopedics on board.  Status post right total hip arthroplasty on 12/18/2018.  Management as per orthopedic team.  Also recommended ambulation with walker.  Syncope, h/o Non ischemic cardiomyopathy status post AICD. Seen by cardiology.   Repeat 2D echocardiogram from 12/16/2018 showed ejection fraction of 45 to 50%.  Cardiology on board.  Spoke with cardiology about it.  Patient is on decreased dose of Toprol and Entresto has been discontinued at this time.  Patient still has orthostatic hypotension is is symptomatic.  I have spoken with cardiology for medication recommendations.    Chronic systolic heart  failure: On biventricular AICD.  Recently interrogated.  Cardiology on board.  Appears compensated at this time. Lasix and spironolactone on hold.  Hypotensive today. last Echo from [id number]  shows systolic function was moderately to severely reduced in the range of 30% to 35%. Diffuse hypokinesis. Doppler parameters are consistent with high ventricular filling pressure.  Repeat 2D echocardiogram from 12/16/2018 showed ejection fraction of 45 to 50%.  Paroxysmal atrial fibrillation; status post AICD 06/2015. Continue amiodarone and toprol.  Toprol dose has been decreased.  EKG with paced rhythm. eliquis on hold.  Has been resumed on Eliquis.  Hypothyroidism:  Continue Synthroid.   Essential hypertension: Now hypotensive..  Continue to hold antihypertensives.    Hypokalemia: Resolved.  Potassium of 5.2.  Hyperlipidemia:   lipitor   GERD; Stable.   VTE Prophylaxis: On Eliquis..   Code Status: Full code  Family Communication: None today.  Disposition Plan:  PT recommended home health PT. Likely home tomorrow if her blood pressure improves and okay with cardiology.  Check orthostatic blood pressure in a.m.  Consultants:  Orthopedics, cardiology  Procedures: Status post right total hip arthroplasty on 12/18/2018.   Antibiotics: Anti-infectives (From admission, onward)   Start     Dose/Rate Route Frequency Ordered Stop   12/18/18 1800  ceFAZolin (ANCEF) IVPB 2g/100 mL premix     2 g 200 mL/hr over 30 Minutes Intravenous Every 6 hours 12/18/18 1631 12/19/18 0044   12/18/18 1019  ceFAZolin (ANCEF) 2-4 GM/100ML-% IVPB    Note to Pharmacy: Marchia Meiers   : cabinet override      12/18/18 1019 12/18/18 2229  12/17/18 0645  ceFAZolin (ANCEF) IVPB 2g/100 mL premix  Status:  Discontinued     2 g 200 mL/hr over 30 Minutes Intravenous On call to O.R. 12/17/18 [id number] 12/18/18 0559   12/17/18 0645  ceFAZolin (ANCEF) 3 g in dextrose 5 % 50 mL IVPB  Status:  Discontinued     3 g 100  mL/hr over 30 Minutes Intravenous On call to O.R. 12/17/18 [id number] 12/17/18 0756     Subjective: Patient denies any chest pain shortness of breath or palpitation.  She was a little orthostatic today and felt little little dizzy and lightheaded.  Objective: Vitals:   12/19/18 1949 12/20/18 0502  BP: 117/72 (!) 91/54  Pulse: 61 60  Resp: 18 18  Temp: 97.9 F (36.6 C) 98.1 F (36.7 C)  SpO2: 100% 95%    Intake/Output Summary (Last 24 hours) at 12/20/2018 1246 Last data filed at 12/20/2018 0900 Gross per 24 hour  Intake 400 ml  Output -  Net 400 ml   Filed Weights   12/16/18 2026  Weight: 64 kg   Body mass index is 23.48 kg/m.   Physical Exam: General:  Average built, not in obvious distress HENT: Normocephalic, pupils equally reacting to light and accommodation.  No scleral pallor or icterus noted. Oral mucosa is moist.  Chest:  Clear breath sounds.  Diminished breath sounds bilaterally. No crackles or wheezes.  CVS: S1 &S2 heard. No murmur.  Irregular rhythm.  AICD in place Abdomen: Soft, nontender, nondistended.  Bowel sounds are heard.  Liver is not palpable, no abdominal mass palpated Extremities: No cyanosis, clubbing or edema.  Peripheral pulses are palpable.  Right hip surgical site clean dry and intact with dressing Psych: Alert, awake and oriented, normal mood CNS:  No cranial nerve deficits.  Power equal in all extremities.  No sensory deficits noted.  No cerebellar signs.   Skin: Warm and dry.  No rashes noted.   Data Review: I have personally reviewed the following laboratory data and studies,  CBC: Recent Labs  Lab 12/16/18 0727 12/17/18 0437 12/18/18 0432 12/19/18 0418 12/20/18 0430  WBC 8.0 7.3 6.8 11.9* 10.0  NEUTROABS 6.2  --   --   --   --   HGB 12.4 11.0* 10.2* 10.1* 8.6*  HCT 37.4 33.6* 31.6* 32.1* 26.0*  MCV 96.1 97.1 98.1 100.9* 96.3  PLT 130* 108* 113* 110* [id number]*   Basic Metabolic Panel: Recent Labs  Lab 12/16/18 0727 12/17/18 1257 12/18/18  0432 12/19/18 0418 12/20/18 0430  NA 135 133* 131* 133* 126*  K 3.3* 3.9 3.2* 5.2* 4.6  CL 96* 103 103 108 100  CO2 27 22 22  19* 20*  GLUCOSE 102* 104* 206* 126* 100*  BUN 21 20 13 14 22   CREATININE 0.97 1.12* 0.95 0.94 1.10*  CALCIUM 8.7* 8.2* 7.8* 7.9* 7.9*  MG  --   --  2.0  --   --    Liver Function Tests: Recent Labs  Lab 12/16/18 0727  AST 71*  ALT 66*  ALKPHOS 56  BILITOT 1.3*  PROT 6.6  ALBUMIN 4.1   No results for input(s): LIPASE, AMYLASE in the last 168 hours. No results for input(s): AMMONIA in the last 168 hours. Cardiac Enzymes: No results for input(s): CKTOTAL, CKMB, CKMBINDEX, TROPONINI in the last 168 hours. BNP (last 3 results) No results for input(s): BNP in the last 8760 hours.  ProBNP (last 3 results) No results for input(s): PROBNP in the last 8760 hours.  CBG: Recent  Labs  Lab 12/17/18 0745 12/18/18 1127  GLUCAP 106* 101*   Recent Results (from the past 240 hour(s))  SARS Coronavirus 2 [facility] order, Performed in [facility] hospital lab) Nasopharyngeal Nasal Mucosa     Status: None   Collection Time: 12/17/18  8:54 AM   Specimen: Nasal Mucosa; Nasopharyngeal  Result Value Ref Range Status   SARS Coronavirus 2 NEGATIVE NEGATIVE Final    Comment: (NOTE) If result is NEGATIVE SARS-CoV-2 target nucleic acids are NOT DETECTED. The SARS-CoV-2 RNA is generally detectable in upper and lower  respiratory specimens during the acute phase of infection. The lowest  concentration of SARS-CoV-2 viral copies this assay can detect is 250  copies / mL. A negative result does not preclude SARS-CoV-2 infection  and should not be used as the sole basis for treatment or other  patient management decisions.  A negative result may occur with  improper specimen collection / handling, submission of specimen other  than nasopharyngeal swab, presence of viral mutation(s) within the  areas targeted by this assay, and inadequate number of viral copies  (<250  copies / mL). A negative result must be combined with clinical  observations, patient history, and epidemiological information. If result is POSITIVE SARS-CoV-2 target nucleic acids are DETECTED. The SARS-CoV-2 RNA is generally detectable in upper and lower  respiratory specimens dur ing the acute phase of infection.  Positive  results are indicative of active infection with SARS-CoV-2.  Clinical  correlation with patient history and other diagnostic information is  necessary to determine patient infection status.  Positive results do  not rule out bacterial infection or co-infection with other viruses. If result is PRESUMPTIVE POSTIVE SARS-CoV-2 nucleic acids MAY BE PRESENT.   A presumptive positive result was obtained on the submitted specimen  and confirmed on repeat testing.  While 2019 novel coronavirus  (SARS-CoV-2) nucleic acids may be present in the submitted sample  additional confirmatory testing may be necessary for epidemiological  and / or clinical management purposes  to differentiate between  SARS-CoV-2 and other Sarbecovirus currently known to infect humans.  If clinically indicated additional testing with an alternate test  methodology [phone]) is advised. The SARS-CoV-2 RNA is generally  detectable in upper and lower respiratory sp ecimens during the acute  phase of infection. The expected result is Negative. Fact Sheet for Patients:  StrictlyIdeas.no Fact Sheet for Healthcare Providers: BankingDealers.co.za This test is not yet approved or cleared by the [place] FDA and has been authorized for detection and/or diagnosis of SARS-CoV-2 by FDA under an Emergency Use Authorization (EUA).  This EUA will remain in effect (meaning this test can be used) for the duration of the COVID-19 declaration under Section 564(b)(1) of the Act, 21 U.S.C. section 360bbb-3(b)(1), unless the authorization is terminated or revoked  sooner. Performed at [facility], [place] [address] [place], [place] [address]   Surgical pcr screen     Status: None   Collection Time: 12/17/18  8:54 AM   Specimen: Nasal Mucosa; Nasal Swab  Result Value Ref Range Status   MRSA, PCR NEGATIVE NEGATIVE Final   Staphylococcus aureus NEGATIVE NEGATIVE Final    Comment: (NOTE) The Xpert SA Assay (FDA approved for NASAL specimens in patients 60 years of age and older), is one component of a comprehensive surveillance program. It is not intended to diagnose infection nor to guide or monitor treatment. Performed at [facility], [place] [address] [place], [place] [address]  Studies: Dg Pelvis Portable  Result Date: 12/18/2018 CLINICAL DATA:  Hip replacement EXAM: PORTABLE PELVIS 1-2 VIEWS COMPARISON:  12/18/2018 FINDINGS: The patient has undergone total hip arthroplasty on the right. There are expected postsurgical changes. Alignment appears near anatomic. There are advanced degenerative changes of the left hip. IMPRESSION: Status post total hip arthroplasty on the right. Electronically Signed   By: Constance Holster M.D.   On: 12/18/2018 16:40   Dg C-arm 1-60 Min-no Report  Result Date: 12/18/2018 Fluoroscopy was utilized by the requesting physician.  No radiographic interpretation.   Dg Hip Operative Unilat With Pelvis Right  Result Date: 12/18/2018 CLINICAL DATA:  Right anterior total hip arthroplasty. EXAM: OPERATIVE RIGHT HIP (WITH PELVIS IF PERFORMED) 2 VIEWS TECHNIQUE: Fluoroscopic spot image(s) were submitted for interpretation post-operatively. COMPARISON:  Pelvic CT 03/07/2009. FINDINGS: Spot fluoroscopic AP images of the right hip and lower pelvis demonstrate interval right total hip arthroplasty. There is a proximal femoral cerclage wire. No displaced fracture, dislocation or hardware malpositioning identified. IMPRESSION: No demonstrated complication following right total hip arthroplasty.  Electronically Signed   By: Richardean Sale M.D.   On: 12/18/2018 13:36    Scheduled Meds: . acetaminophen  500 mg Oral Q6H  . amiodarone  200 mg Oral Daily  . apixaban  2.5 mg Oral BID  . atorvastatin  40 mg Oral Daily  . citalopram  20 mg Oral Daily  . docusate sodium  100 mg Oral BID  . levothyroxine  25 mcg Oral QAC breakfast  . magnesium oxide  400 mg Oral BID  . metoprolol succinate  12.5 mg Oral Daily  . senna  1 tablet Oral BID    Continuous Infusions:   Flora Lipps, MD  [facility] 12/20/2018

## 2018-12-21 DIAGNOSIS — K219 Gastro-esophageal reflux disease without esophagitis: Secondary | ICD-10-CM

## 2018-12-21 LAB — BASIC METABOLIC PANEL
Anion gap: 7 (ref 5–15)
BUN: 18 mg/dL (ref 8–23)
CO2: 20 mmol/L — ABNORMAL LOW (ref 22–32)
Calcium: 8.1 mg/dL — ABNORMAL LOW (ref 8.9–10.3)
Chloride: 103 mmol/L (ref 98–111)
Creatinine, Ser: 0.99 mg/dL (ref 0.44–1.00)
GFR calc Af Amer: >60 mL/min (ref >60)
GFR calc non Af Amer: 54 mL/min — ABNORMAL LOW (ref >60)
Glucose, Bld: 91 mg/dL (ref 70–99)
Potassium: 4.3 mmol/L (ref 3.5–5.1)
Sodium: 130 mmol/L — ABNORMAL LOW (ref 135–145)

## 2018-12-21 LAB — CBC
HCT: 26.6 % — ABNORMAL LOW (ref 36.0–46.0)
Hemoglobin: 8.6 g/dL — ABNORMAL LOW (ref 12.0–15.0)
MCH: 32.5 pg (ref 26.0–34.0)
MCHC: 32.3 g/dL (ref 30.0–36.0)
MCV: 100.4 fL — ABNORMAL HIGH (ref 80.0–100.0)
Platelets: 130 10*3/uL — ABNORMAL LOW (ref 150–400)
RBC: 2.65 MIL/uL — ABNORMAL LOW (ref 3.87–5.11)
RDW: 13.6 % (ref 11.5–15.5)
WBC: 8.7 10*3/uL (ref 4.0–10.5)
nRBC: 0.3 % — ABNORMAL HIGH (ref 0.0–0.2)

## 2018-12-21 LAB — MAGNESIUM: Magnesium: 2.2 mg/dL (ref 1.7–2.4)

## 2018-12-21 MED ORDER — METOPROLOL SUCCINATE ER 25 MG PO TB24: 12.5000 mg | ORAL_TABLET | Freq: Every day | ORAL | 0 refills | Status: DC

## 2018-12-21 NOTE — Discharge Summary (Signed)
Physician Discharge Summary  Cynthia Frank [id number] DOB: 02/02/1936 DOA: 12/16/2018  PCP: [name], MD  Admit date: 12/16/2018 Discharge date: 12/21/2018  Admitted From: Home  Discharge disposition: Home health   Recommendations for Outpatient Follow-Up:    Follow up with your primary care provider, orthopedics and cardiology as outpatient  Discharge Diagnosis:   Principal Problem:   Closed right hip fracture [facility]) Active Problems:   Essential hypertension   Atrial fibrillation ([place])   Nonischemic cardiomyopathy ([place])   Hyperlipidemia   GERD (gastroesophageal reflux disease)   Chronic systolic CHF (congestive heart failure) (HCC)   Hypokalemia   Syncope and collapse   Orthostatic hypotension   Discharge Condition: Improved.  Diet recommendation: Low sodium, heart healthy.  Wound care: Local wound care  Code status: Full.   History of Present Illness:   Cynthia Frank a 83 y.o.femalewith medical history significant ofnon ischemic cardiomyopathy, s/p ICD, chronic systolic heart failure, CAD, atrial fibrillation, hypertension, hyperlipidemia, GERD, [place] and walking in the house , passed out and fell on her right side. Her husband witnessed the fall. She lost consciousness for a few seconds and started having pain in the right leg and right hip.  She was seen at the local ED [facility], was found to have proximal hip fracture. She requested to be transferred to [facility] hospital for hip repair. X-ray of the right hip with pelvis showed right sub capital hip fracture. She was admitted to medical service and [facility] consulted for evaluation of right hip fracture.   Hospital Course:   Following conditions were addressed during hospitalization,  Closed right hip fracture status post syncope.  Status post right total hip arthroplasty on 12/18/2018.    Patient was seen by physical therapy who  recommended home health PT on discharge.  Patient will have to follow-up with orthopedic as outpatient after discharge.  Syncope, h/o Non ischemic cardiomyopathy status post AICD. Seen by cardiology.   Repeat 2D echocardiogram from 12/16/2018 showed ejection fraction of 45 to 50%.  Dose of Toprol has been decreased at this time due to borderline low blood pressure.  Lasix, entresto and Aldactone has been discontinued as per cardiology.  Patient does not feel dizzy or lightheaded today.  Spoke with cardiology at bedside regarding disposition only on Toprol at this time.  Chronic systolic heart failure: Due to syncope on presentation, her biventricular AICD was interrogated.  Cardiology has recommended holding off on Entresto, Aldactone and Lasix at this time with decreased dose of beta-blocker on discharge and will follow-up in the cardiology clinic. Echo from [id number] shows systolic function was moderately to severely reduced in the range of 30% to 35%.  Repeat 2D echocardiogram from 12/16/2018 showed ejection fraction of 45 to 50%.  Paroxysmal atrial fibrillation; status post AICD 06/2015. Continue amiodarone and toprol.  Toprol dose has been decreased.  EKG with paced rhythm.  Continue Eliquis.  Hypothyroidism:  Continue Synthroid.   Essential hypertension:  Medications have been adjusted.  Blood pressure prior to discharge was 112/ 74  Hypokalemia: Resolved.  Potassium of 5.2.  Hyperlipidemia:   lipitor   GERD; Stable.  Disposition.  Home with home health today.  At this time, patient is a stable for disposition home.  She will have to follow-up with cardiology orthopedic and primary care physician as outpatient.  I also spoke with the patient's husband bedside regarding the need for follow-up with cardiology orthopedics and the  plan for disposition home with home health.   Medical Consultants:    Orthopedics,   cardiology   Subjective:   Today, patient feels okay.   Denies any chest pain, shortness of breath, dizziness, lightheadedness.  Denies overt hip pain.  Discharge Exam:   Vitals:   12/21/18 1057 12/21/18 1308  BP: 101/60 112/74  Pulse: 60 (!) 59  Resp:  16  Temp:  98.3 F (36.8 C)  SpO2:  97%   Vitals:   12/21/18 0421 12/21/18 0955 12/21/18 1057 12/21/18 1308  BP: 118/71 116/70 101/60 112/74  Pulse: 65 83 60 (!) 59  Resp: 17   16  Temp:    98.3 F (36.8 C)  TempSrc:      SpO2: 96%   97%  Weight:      Height:       Body mass index is 23.48 kg/m.   General exam: Appears calm and comfortable ,Not in distress HEENT:PERRL,Oral mucosa moist Respiratory system: Bilateral equal air entry, normal vesicular breath sounds, no wheezes or crackles  Cardiovascular system: S1 & S2 heard, irregular rhythm.  Chest wall AICD in place. Gastrointestinal system: Abdomen is nondistended, soft and nontender. No organomegaly or masses felt. Normal bowel sounds heard. Central nervous system: Alert and oriented. No focal neurological deficits. Extremities: No edema, no clubbing ,no cyanosis, distal peripheral pulses palpable.  Right hip surgical site with dressing. Skin: No rashes, lesions or ulcers,no icterus ,no pallor MSK: Normal muscle bulk,tone ,power    Procedures:    Status post right total hip arthroplasty on 12/18/2018.   The results of significant diagnostics from this hospitalization (including imaging, microbiology, ancillary and laboratory) are listed below for reference.     Diagnostic Studies:   Dg Knee Right Port  Result Date: 12/16/2018 CLINICAL DATA:  Pain after fall 1 day ago. EXAM: PORTABLE RIGHT KNEE - 1-2 VIEW COMPARISON:  None. FINDINGS: Chondrocalcinosis. Vascular calcifications. No fractures, dislocations, or effusions. IMPRESSION: No acute abnormalities.  Chondrocalcinosis consistent with CPPD. Electronically Signed   By: Dorise Bullion III M.D   On: 12/16/2018 12:40     Labs:   Basic Metabolic Panel: Recent Labs   Lab 12/17/18 1257 12/18/18 0432 12/19/18 0418 12/20/18 0430 12/21/18 0408  NA 133* 131* 133* 126* 130*  K 3.9 3.2* 5.2* 4.6 4.3  CL 103 103 108 100 103  CO2 22 22 19* 20* 20*  GLUCOSE 104* 206* 126* 100* 91  BUN 20 13 14 22 18   CREATININE 1.12* 0.95 0.94 1.10* 0.99  CALCIUM 8.2* 7.8* 7.9* 7.9* 8.1*  MG  --  2.0  --   --  2.2   GFR Estimated Creatinine Clearance: 40.8 mL/min (by C-G formula based on SCr of 0.99 mg/dL). Liver Function Tests: Recent Labs  Lab 12/16/18 0727  AST 71*  ALT 66*  ALKPHOS 56  BILITOT 1.3*  PROT 6.6  ALBUMIN 4.1   No results for input(s): LIPASE, AMYLASE in the last 168 hours. No results for input(s): AMMONIA in the last 168 hours. Coagulation profile Recent Labs  Lab 12/16/18 0727  INR 1.2    CBC: Recent Labs  Lab 12/16/18 0727 12/17/18 0437 12/18/18 0432 12/19/18 0418 12/20/18 0430 12/21/18 0408  WBC 8.0 7.3 6.8 11.9* 10.0 8.7  NEUTROABS 6.2  --   --   --   --   --   HGB 12.4 11.0* 10.2* 10.1* 8.6* 8.6*  HCT 37.4 33.6* 31.6* 32.1* 26.0* 26.6*  MCV 96.1 97.1 98.1 100.9* 96.3 100.4*  PLT 130* 108* 113* 110* 106* 130*   Cardiac Enzymes: No results for input(s): CKTOTAL, CKMB, CKMBINDEX, TROPONINI in the last 168 hours. BNP: Invalid input(s): POCBNP CBG: Recent Labs  Lab 12/17/18 0745 12/18/18 1127  GLUCAP 106* 101*   D-Dimer No results for input(s): DDIMER in the last 72 hours. Hgb A1c No results for input(s): HGBA1C in the last 72 hours. Lipid Profile No results for input(s): CHOL, HDL, LDLCALC, TRIG, CHOLHDL, LDLDIRECT in the last 72 hours. Thyroid function studies No results for input(s): TSH, T4TOTAL, T3FREE, THYROIDAB in the last 72 hours.  Invalid input(s): FREET3 Anemia work up No results for input(s): VITAMINB12, FOLATE, FERRITIN, TIBC, IRON, RETICCTPCT in the last 72 hours. Microbiology Recent Results (from the past 240 hour(s))  SARS Coronavirus 2 [facility] order, Performed in [facility] hospital lab)  Nasopharyngeal Nasal Mucosa     Status: None   Collection Time: 12/17/18  8:54 AM   Specimen: Nasal Mucosa; Nasopharyngeal  Result Value Ref Range Status   SARS Coronavirus 2 NEGATIVE NEGATIVE Final    Comment: (NOTE) If result is NEGATIVE SARS-CoV-2 target nucleic acids are NOT DETECTED. The SARS-CoV-2 RNA is generally detectable in upper and lower  respiratory specimens during the acute phase of infection. The lowest  concentration of SARS-CoV-2 viral copies this assay can detect is 250  copies / mL. A negative result does not preclude SARS-CoV-2 infection  and should not be used as the sole basis for treatment or other  patient management decisions.  A negative result may occur with  improper specimen collection / handling, submission of specimen other  than nasopharyngeal swab, presence of viral mutation(s) within the  areas targeted by this assay, and inadequate number of viral copies  (<250 copies / mL). A negative result must be combined with clinical  observations, patient history, and epidemiological information. If result is POSITIVE SARS-CoV-2 target nucleic acids are DETECTED. The SARS-CoV-2 RNA is generally detectable in upper and lower  respiratory specimens dur ing the acute phase of infection.  Positive  results are indicative of active infection with SARS-CoV-2.  Clinical  correlation with patient history and other diagnostic information is  necessary to determine patient infection status.  Positive results do  not rule out bacterial infection or co-infection with other viruses. If result is PRESUMPTIVE POSTIVE SARS-CoV-2 nucleic acids MAY BE PRESENT.   A presumptive positive result was obtained on the submitted specimen  and confirmed on repeat testing.  While 2019 novel coronavirus  (SARS-CoV-2) nucleic acids may be present in the submitted sample  additional confirmatory testing may be necessary for epidemiological  and / or clinical management purposes  to  differentiate between  SARS-CoV-2 and other Sarbecovirus currently known to infect humans.  If clinically indicated additional testing with an alternate test  methodology [phone]) is advised. The SARS-CoV-2 RNA is generally  detectable in upper and lower respiratory sp ecimens during the acute  phase of infection. The expected result is Negative. Fact Sheet for Patients:  StrictlyIdeas.no Fact Sheet for Healthcare Providers: BankingDealers.co.za This test is not yet approved or cleared by the [place] FDA and has been authorized for detection and/or diagnosis of SARS-CoV-2 by FDA under an Emergency Use Authorization (EUA).  This EUA will remain in effect (meaning this test can be used) for the duration of the COVID-19 declaration under Section 564(b)(1) of the Act, 21 U.S.C. section 360bbb-3(b)(1), unless the authorization is terminated or revoked sooner. Performed at [facility], [address]  [place], [place] [address]   Surgical pcr screen     Status: None   Collection Time: 12/17/18  8:54 AM   Specimen: Nasal Mucosa; Nasal Swab  Result Value Ref Range Status   MRSA, PCR NEGATIVE NEGATIVE Final   Staphylococcus aureus NEGATIVE NEGATIVE Final    Comment: (NOTE) The Xpert SA Assay (FDA approved for NASAL specimens in patients 10 years of age and older), is one component of a comprehensive surveillance program. It is not intended to diagnose infection nor to guide or monitor treatment. Performed at [facility], [place] [address] [place], [place] [address]      Discharge Instructions:   Discharge Instructions    Diet - low sodium heart healthy   Complete by: As directed    Discharge instructions   Complete by: As directed    Follow-up with cardiology clinic as scheduled by the clinic.  Follow-up with orthopedics in 2  weeks.  Do not take your Lasix Entresto and Aldactone until seen  by your cardiologist.   Increase activity slowly   Complete by: As directed      Allergies as of 12/21/2018      Reactions   Clindamycin/lincomycin    itching   Dramamine Ii [meclizine Hcl]    Pneumococcal Vaccines Itching, Swelling   Redness   Sulfonamide Derivatives Itching, Swelling      Medication List    STOP taking these medications   Entresto 24-26 MG Generic drug: sacubitril-valsartan   furosemide 20 MG tablet Commonly known as: LASIX   spironolactone 25 MG tablet Commonly known as: ALDACTONE     TAKE these medications   amiodarone 200 MG tablet Commonly known as: PACERONE TAKE 1 TABLET BY MOUTH DAILY   apixaban 5 MG Tabs tablet Commonly known as: Eliquis Take 1 tablet (5 mg total) by mouth 2 (two) times daily.   atorvastatin 40 MG tablet Commonly known as: LIPITOR TAKE 1 TABLET BY MOUTH EVERY DAY What changed: when to take this   citalopram 20 MG tablet Commonly known as: CELEXA TAKE 1 TABLET(20 MG) BY MOUTH DAILY What changed: See the new instructions.   conjugated estrogens vaginal cream Commonly known as: PREMARIN Place 1 Applicatorful vaginally as needed (dyspareunia (Use as directed)). Reported on 05/11/2015   CoQ10 100 MG Caps Take 100 mg by mouth daily.   diclofenac sodium 1 % Gel Commonly known as: VOLTAREN APPLY 2 GRAMS TOPICALLY 4 TIMES DAILY What changed: See the new instructions.   HYDROcodone-acetaminophen 5-325 MG tablet Commonly known as: NORCO/VICODIN Take 1-2 tablets by mouth every 4 (four) hours as needed for moderate pain (pain score 4-6).   levothyroxine 25 MCG tablet Commonly known as: SYNTHROID TAKE 1 TABLET(25 MCG) BY MOUTH DAILY BEFORE BREAKFAST What changed: See the new instructions.   Magnesium Oxide 500 MG Caps Take 2,000 mg by mouth daily.   metoprolol succinate 25 MG 24 hr tablet Commonly known as: TOPROL-XL Take 0.5 tablets (12.5 mg total) by mouth daily. What changed:   how much to take  when to take  this   zolpidem 5 MG tablet Commonly known as: AMBIEN TAKE 1 TABLET BY MOUTH EVERY DAY AT BEDTIME AS NEEDED What changed: See the new instructions.      Follow-up Information    Swinteck, Aaron Edelman, MD. Schedule an appointment as soon as possible for a visit in 2 weeks.   Specialty: Orthopedic Surgery Why: For wound re-check Contact information: [address] [place] [address] [phone]  Darrick Grinder D, NP Follow up on 01/07/2019.   Specialty: Cardiology Why: Your follow up appointment will be on 01/07/2019 at 11am  Contact information: [address] [place] [address] [phone]            Time coordinating discharge: 39 minutes  Signed:  Irma Roulhac  [facility] 12/21/2018, 1:41 PM

## 2018-12-21 NOTE — Progress Notes (Signed)
Progress Note  Patient Name: Cynthia Frank Date of Encounter: 12/21/2018  Primary Cardiologist: Dr. Aundra Dubin EP :Dr. Lovena Le  Subjective   Doing well this AM. Has mild surgical pain. No SOB or chest pain.   Inpatient Medications    Scheduled Meds: . acetaminophen  500 mg Oral Q6H  . amiodarone  200 mg Oral Daily  . apixaban  2.5 mg Oral BID  . atorvastatin  40 mg Oral Daily  . citalopram  20 mg Oral Daily  . docusate sodium  100 mg Oral BID  . levothyroxine  25 mcg Oral QAC breakfast  . magnesium oxide  400 mg Oral BID  . metoprolol succinate  12.5 mg Oral Daily  . senna  1 tablet Oral BID   Continuous Infusions:  PRN Meds: acetaminophen, HYDROcodone-acetaminophen, HYDROcodone-acetaminophen, menthol-cetylpyridinium **OR** phenol, metoCLOPramide **OR** metoCLOPramide (REGLAN) injection, morphine injection, ondansetron (ZOFRAN) IV, ondansetron **OR** ondansetron (ZOFRAN) IV, zolpidem   Vital Signs    Vitals:   12/21/18 0418 12/21/18 0421 12/21/18 0955 12/21/18 1057  BP: 123/73 118/71 116/70 101/60  Pulse: 63 65 83 60  Resp: 16 17    Temp:      TempSrc:      SpO2: 97% 96%    Weight:      Height:        Intake/Output Summary (Last 24 hours) at 12/21/2018 1306 Last data filed at 12/21/2018 0400 Gross per 24 hour  Intake 240 ml  Output -  Net 240 ml   Filed Weights   12/16/18 2026  Weight: 64 kg    Physical Exam   General: Well developed, well nourished, NAD Skin: Warm, dry, intact  Lungs:Clear to ausculation bilaterally. No wheezes, rales, or rhonchi. Breathing is unlabored. Cardiovascular: AV paced with S1 S2. No murmur Abdomen: Soft, non-tender, non-distended. No obvious abdominal masses. Extremities: No edema. No clubbing or cyanosis. DP pulses 1+ bilaterally Neuro: Alert and oriented. No focal deficits. No facial asymmetry. MAE spontaneously. Psych: Responds to questions appropriately with normal affect.    Labs    Chemistry Recent Labs   Lab 12/16/18 [phone]  12/19/18 0418 12/20/18 0430 12/21/18 0408  NA 135   < > 133* 126* 130*  K 3.3*   < > 5.2* 4.6 4.3  CL 96*   < > 108 100 103  CO2 27   < > 19* 20* 20*  GLUCOSE 102*   < > 126* 100* 91  BUN 21   < > 14 22 18   CREATININE 0.97   < > 0.94 1.10* 0.99  CALCIUM 8.7*   < > 7.9* 7.9* 8.1*  PROT 6.6  --   --   --   --   ALBUMIN 4.1  --   --   --   --   AST 71*  --   --   --   --   ALT 66*  --   --   --   --   ALKPHOS 56  --   --   --   --   BILITOT 1.3*  --   --   --   --   GFRNONAA 55*   < > 57* 47* 54*  GFRAA >60   < > >60 55* >60  ANIONGAP 12   < > 6 6 7    < > = values in this interval not displayed.     Hematology Recent Labs  Lab 12/19/18 0418 12/20/18 0430 12/21/18 0408  WBC 11.9* 10.0  8.7  RBC 3.18* 2.70* 2.65*  HGB 10.1* 8.6* 8.6*  HCT 32.1* 26.0* 26.6*  MCV 100.9* 96.3 100.4*  MCH 31.8 31.9 32.5  MCHC 31.5 33.1 32.3  RDW 13.4 13.4 13.6  PLT 110* 106* 130*    Cardiac EnzymesNo results for input(s): TROPONINI in the last 168 hours. No results for input(s): TROPIPOC in the last 168 hours.   BNPNo results for input(s): BNP, PROBNP in the last 168 hours.   DDimer No results for input(s): DDIMER in the last 168 hours.   Radiology    No results found.  Telemetry    12/21/2018 AV paced  - Personally Reviewed  ECG    No new tracing as of 12/21/2018- Personally Reviewed  Cardiac Studies   Echo 12/16/18 Sonographer Comments: Suboptimal apical window. Restricted mobility. Patient has broken hip could not turn. IMPRESSIONS  1. The left ventricle has mildly reduced systolic function, with an ejection fraction of 45-50%. The cavity size was normal. Left ventricular diastolic Doppler parameters are indeterminate. No evidence of left ventricular regional wall motion  abnormalities. 2. The right ventricle has normal systolic function. The cavity was normal. There is no increase in right ventricular wall thickness. Right ventricular systolic  pressure is moderately elevated. 3. Left atrial size was mildly dilated. 4. There is mild mitral annular calcification present. 5. Tricuspid valve regurgitation is mild-moderate. 6. The aorta is normal unless otherwise noted. 7. Ventricular pacing noted. No visible A wave on mitral inflow or on mitral annulus tissue Doppler suggests atrial fibrillation at the time of the study (or atrial mechanical failure). This limits the accuracy of diastolic function assessment. 8. When compared to the prior study: 12/16/2015, there is improved left ventricular systolic function due to improved lateral-septal synchrony by CRT pacing. Despite this, there is worsened pulmonary artery hypertension.  FINDINGS Left Ventricle: The left ventricle has mildly reduced systolic function, with an ejection fraction of 45-50%. The cavity size was normal. There is no increase in left ventricular wall thickness. Left ventricular diastolic Doppler parameters are  indeterminate. No evidence of left ventricular regional wall motion abnormalities..  Right Ventricle: The right ventricle has normal systolic function. The cavity was normal. There is no increase in right ventricular wall thickness. Right ventricular systolic pressure is moderately elevated. Pacing wire/catheter visualized in the right  ventricle.  Left Atrium: Left atrial size was mildly dilated.  Right Atrium: Right atrial size was normal in size.  Interatrial Septum: No atrial level shunt detected by color flow Doppler.  Pericardium: There is no evidence of pericardial effusion.  Mitral Valve: The mitral valve is normal in structure. There is mild mitral annular calcification present. Mitral valve regurgitation is mild by color flow Doppler.  Tricuspid Valve: The tricuspid valve is normal in structure. Tricuspid valve regurgitation is mild-moderate by color flow Doppler.  Aortic Valve: The aortic valve is normal in structure. Aortic valve  regurgitation is trivial by color flow Doppler.  Pulmonic Valve: The pulmonic valve was normal in structure. Pulmonic valve regurgitation is not visualized by color flow Doppler.  Aorta: The aorta is normal unless otherwise noted.  Venous: The inferior vena cava is normal in size with greater than 50% respiratory variability.  Compared to previous exam: 12/16/2015, there is improved left ventricular systolic function due to improved lateral-septal synchrony by CRT pacing. Despite this, there is worsened pulmonary artery hypertension.  Patient Profile     83 y.o. female a h/o nonischemic CM, chronic systolic dysfunction, and atrial fibrillation s/p BiV  ICD implantation by Dr Lovena Le in 2017with improved LV function. Frequent episodes of symptomatic hypotensionin which she sustained4 episodesof syncope which she attributes to low BP. Her last episode occurred in February. Upon falling 11/12/17, she had a transverse process fracture of C5.  Assessment & Plan    1. Preoperative evaluation: -Pt noted toat increased risk for surgery given reduced EF however, this is not reversible and not likely to improve with further CV procedures/ management.Underwent procedure 12/18/2018>>right total hip arthroplasty>>doing well in the post-op setting   2. Paroxysmal atrial fibrillation: -AV paced on telemetry -Medtronic ICD in place -Eliquis 2.5mg  resumed per surgical team>>increase to 5mg  on 09/05 per ortho notes  -Continue Amiodarone 200mg  QD -Reduce Toprol to 12.5 given low BP  -BP more stable 118/71>123/73>114/68 -CHA2DS2VASc =at least 5   3. Chronic systolic dysfunction/nonischemic cardiomyopathy: -Would not restart Entresto at d/c in the setting of recurrent syncope secondary to hypotension -Dr. Rayann Heman to see in device clinic after d/c to optimize CRT -Net + 2.8L>>does not appear to be fluid volume overloaded on exam   4. Syncope: -Likely in the setting of hypotension>>Do not  restart Entresto on discharge   -Medtronic BiV ICD last interrogated 12/10/2018 with thresholds and sensing consistent with previous device measurements. Lead impedance trends stable over time. AT/AF burden 3.5%.AT/AF episodes appear to be known AF, + Eliquis. No ventricular arrhythmia episodes recorded. Patient bi-ventricularly pacing 98.7% of the time. Device programmed with appropriate safety margins. Heart failure diagnostics reviewed and trends are stable for patient. No changes made this session. -Continue Toprol to 12.5mg  QD given stable BP   5. Hyperkalemia: -K+, 4.3 today>>>improved from 09/02 -No arrhthymias on tele   Signed, Kathyrn Drown NP-C [place] Pager: [phone] 12/21/2018, 1:06 PM     [facility] will sign off.   Medication Recommendations:  Toprol XL 12.5 mg daily. Other recommendations (labs, testing, etc):  None Follow up as an outpatient:  Follow-up scheduled in heart failure clinic 01/07/19  For questions or updates, please contact   Please consult [email or web address] for contact info under Cardiology/STEMI.  Patient seen and examined.  Agree with above documentation.  She denies any shortness of breath or chest pain.  On exam, she is alert and oriented, lungs are clear to auscultation bilaterally, no JVD or lower extremity edema, regular rate and rhythm, no murmurs.  Blood pressure appears improved on Toprol-XL 12.5 mg.  We will plan to discharge on this medication.  Given soft pressures, will hold Entresto and Aldactone on discharge.  Appears euvolemic will continue to hold Lasix.

## 2018-12-21 NOTE — Progress Notes (Addendum)
Progress Note  Patient Name: Cynthia Frank Date of Encounter: 12/21/2018  Primary Cardiologist: Dr. Aundra Dubin EP :Dr. Lovena Le  Subjective   Doing well this AM. Has mild surgical pain. No SOB or chest pain.   Inpatient Medications    Scheduled Meds: . acetaminophen  500 mg Oral Q6H  . amiodarone  200 mg Oral Daily  . apixaban  2.5 mg Oral BID  . atorvastatin  40 mg Oral Daily  . citalopram  20 mg Oral Daily  . docusate sodium  100 mg Oral BID  . levothyroxine  25 mcg Oral QAC breakfast  . magnesium oxide  400 mg Oral BID  . metoprolol succinate  12.5 mg Oral Daily  . senna  1 tablet Oral BID   Continuous Infusions:  PRN Meds: acetaminophen, HYDROcodone-acetaminophen, HYDROcodone-acetaminophen, menthol-cetylpyridinium **OR** phenol, metoCLOPramide **OR** metoCLOPramide (REGLAN) injection, morphine injection, ondansetron (ZOFRAN) IV, ondansetron **OR** ondansetron (ZOFRAN) IV, zolpidem   Vital Signs    Vitals:   12/20/18 2035 12/21/18 0416 12/21/18 0418 12/21/18 0421  BP: 99/65 114/68 123/73 118/71  Pulse: 61 68 63 65  Resp: 18 17 16 17   Temp: 98.2 F (36.8 C) 98.3 F (36.8 C)    TempSrc: Oral     SpO2: 92% 96% 97% 96%  Weight:      Height:        Intake/Output Summary (Last 24 hours) at 12/21/2018 0839 Last data filed at 12/21/2018 0400 Gross per 24 hour  Intake 740 ml  Output -  Net 740 ml   Filed Weights   12/16/18 2026  Weight: 64 kg    Physical Exam   General: Well developed, well nourished, NAD Skin: Warm, dry, intact  Lungs:Clear to ausculation bilaterally. No wheezes, rales, or rhonchi. Breathing is unlabored. Cardiovascular: AV paced with S1 S2. No murmur Abdomen: Soft, non-tender, non-distended. No obvious abdominal masses. Extremities: No edema. No clubbing or cyanosis. DP pulses 1+ bilaterally Neuro: Alert and oriented. No focal deficits. No facial asymmetry. MAE spontaneously. Psych: Responds to questions appropriately with normal  affect.    Labs    Chemistry Recent Labs  Lab 12/16/18 [phone]  12/19/18 0418 12/20/18 0430 12/21/18 0408  NA 135   < > 133* 126* 130*  K 3.3*   < > 5.2* 4.6 4.3  CL 96*   < > 108 100 103  CO2 27   < > 19* 20* 20*  GLUCOSE 102*   < > 126* 100* 91  BUN 21   < > 14 22 18   CREATININE 0.97   < > 0.94 1.10* 0.99  CALCIUM 8.7*   < > 7.9* 7.9* 8.1*  PROT 6.6  --   --   --   --   ALBUMIN 4.1  --   --   --   --   AST 71*  --   --   --   --   ALT 66*  --   --   --   --   ALKPHOS 56  --   --   --   --   BILITOT 1.3*  --   --   --   --   GFRNONAA 55*   < > 57* 47* 54*  GFRAA >60   < > >60 55* >60  ANIONGAP 12   < > 6 6 7    < > = values in this interval not displayed.     Hematology Recent Labs  Lab 12/19/18 0418 12/20/18 0430  12/21/18 0408  WBC 11.9* 10.0 8.7  RBC 3.18* 2.70* 2.65*  HGB 10.1* 8.6* 8.6*  HCT 32.1* 26.0* 26.6*  MCV 100.9* 96.3 100.4*  MCH 31.8 31.9 32.5  MCHC 31.5 33.1 32.3  RDW 13.4 13.4 13.6  PLT 110* 106* 130*    Cardiac EnzymesNo results for input(s): TROPONINI in the last 168 hours. No results for input(s): TROPIPOC in the last 168 hours.   BNPNo results for input(s): BNP, PROBNP in the last 168 hours.   DDimer No results for input(s): DDIMER in the last 168 hours.   Radiology    No results found.  Telemetry    12/21/2018 AV paced  - Personally Reviewed  ECG    No new tracing as of 12/21/2018- Personally Reviewed  Cardiac Studies   Echo 12/16/18 Sonographer Comments: Suboptimal apical window. Restricted mobility. Patient has broken hip could not turn. IMPRESSIONS  1. The left ventricle has mildly reduced systolic function, with an ejection fraction of 45-50%. The cavity size was normal. Left ventricular diastolic Doppler parameters are indeterminate. No evidence of left ventricular regional wall motion  abnormalities. 2. The right ventricle has normal systolic function. The cavity was normal. There is no increase in right ventricular  wall thickness. Right ventricular systolic pressure is moderately elevated. 3. Left atrial size was mildly dilated. 4. There is mild mitral annular calcification present. 5. Tricuspid valve regurgitation is mild-moderate. 6. The aorta is normal unless otherwise noted. 7. Ventricular pacing noted. No visible A wave on mitral inflow or on mitral annulus tissue Doppler suggests atrial fibrillation at the time of the study (or atrial mechanical failure). This limits the accuracy of diastolic function assessment. 8. When compared to the prior study: 12/16/2015, there is improved left ventricular systolic function due to improved lateral-septal synchrony by CRT pacing. Despite this, there is worsened pulmonary artery hypertension.  FINDINGS Left Ventricle: The left ventricle has mildly reduced systolic function, with an ejection fraction of 45-50%. The cavity size was normal. There is no increase in left ventricular wall thickness. Left ventricular diastolic Doppler parameters are  indeterminate. No evidence of left ventricular regional wall motion abnormalities..  Right Ventricle: The right ventricle has normal systolic function. The cavity was normal. There is no increase in right ventricular wall thickness. Right ventricular systolic pressure is moderately elevated. Pacing wire/catheter visualized in the right  ventricle.  Left Atrium: Left atrial size was mildly dilated.  Right Atrium: Right atrial size was normal in size.  Interatrial Septum: No atrial level shunt detected by color flow Doppler.  Pericardium: There is no evidence of pericardial effusion.  Mitral Valve: The mitral valve is normal in structure. There is mild mitral annular calcification present. Mitral valve regurgitation is mild by color flow Doppler.  Tricuspid Valve: The tricuspid valve is normal in structure. Tricuspid valve regurgitation is mild-moderate by color flow Doppler.  Aortic Valve: The aortic  valve is normal in structure. Aortic valve regurgitation is trivial by color flow Doppler.  Pulmonic Valve: The pulmonic valve was normal in structure. Pulmonic valve regurgitation is not visualized by color flow Doppler.  Aorta: The aorta is normal unless otherwise noted.  Venous: The inferior vena cava is normal in size with greater than 50% respiratory variability.  Compared to previous exam: 12/16/2015, there is improved left ventricular systolic function due to improved lateral-septal synchrony by CRT pacing. Despite this, there is worsened pulmonary artery hypertension.  Patient Profile     83 y.o. female a h/o nonischemic CM, chronic systolic  dysfunction, and atrial fibrillation s/p BiV ICD implantation by Dr Lovena Le in 2017with improved LV function. Frequent episodes of symptomatic hypotensionin which she sustained4 episodesof syncope which she attributes to low BP. Her last episode occurred in February. Upon falling 11/12/17, she had a transverse process fracture of C5.  Assessment & Plan    1. Preoperative evaluation: -Pt noted toat increased risk for surgery given reduced EF however, this is not reversible and not likely to improve with further CV procedures/ management.Underwent procedure 12/18/2018>>right total hip arthroplasty>>doing well in the post-op setting   2. Paroxysmal atrial fibrillation: -AV paced on telemetry -Medtronic ICD in place -Eliquis 2.5mg  resumed per surgical team>>increase to 5mg  on 09/05 per ortho notes  -Continue Amiodarone 200mg  QD -Reduce Toprol to 12.5 given low BP  -BP more stable 118/71>123/73>114/68 -CHA2DS2VASc =at least 5   3. Chronic systolic dysfunction/nonischemic cardiomyopathy: -Would not restart Entresto at d/c in the setting of recurrent syncope secondary to hypotension -Dr. Rayann Heman to see in device clinic after d/c to optimize CRT -Net + 2.8L>>does not appear to be fluid volume overloaded on exam   4. Syncope:  -Likely in the setting of hypotension>>Do not restart Entresto on discharge   -Medtronic BiV ICD last interrogated 12/10/2018 with thresholds and sensing consistent with previous device measurements. Lead impedance trends stable over time. AT/AF burden 3.5%.AT/AF episodes appear to be known AF, + Eliquis. No ventricular arrhythmia episodes recorded. Patient bi-ventricularly pacing 98.7% of the time. Device programmed with appropriate safety margins. Heart failure diagnostics reviewed and trends are stable for patient. No changes made this session. -Continue Toprol to 12.5mg  QD given stable BP   5. Hyperkalemia: -K+, 4.3 today>>>improved from 09/02 -No arrhthymias on tele   Signed, Kathyrn Drown NP-C [place] Pager: [phone] 12/21/2018, 8:39 AM     [facility] will sign off.   Medication Recommendations:  Toprol XL 12.5 mg daily. Other recommendations (labs, testing, etc):  None Follow up as an outpatient:  Will schedule close follow-up in heart failure clinic.  For questions or updates, please contact   Please consult [email or web address] for contact info under Cardiology/STEMI.  Patient seen and examined.  Agree with above documentation.  She denies any shortness of breath or chest pain.  On exam, she is alert and oriented, lungs are clear to auscultation bilaterally, no JVD or lower extremity edema, regular rate and rhythm, no murmurs.  Blood pressure appears improved on Toprol-XL 12.5 mg.  We will plan to discharge on this medication.  Given soft pressures, will hold Entresto and Aldactone on discharge.  Appears euvolemic will continue to hold Lasix.

## 2018-12-21 NOTE — Progress Notes (Signed)
Physical Therapy Treatment Patient Details Name: Cynthia Frank MRN: [id number] DOB: [birth year] Today's Date: 12/21/2018    History of Present Illness Pt ws at her [place] and fell after syncopal episode and fractured R femoral head, transferred her to [facility] and now s/p R DATHA with [facility]. Pt with history of pacemaker, a-fib , ankle fracture.    PT Comments    Pt ambulated in hallway and performed LE exercises once in recliner.  Pt denies any dizziness today.  Pt hopeful for d/c home soon.  Pt able to recall correct sequence for stairs (performed yesterday) and has HEP handout in room.    Follow Up Recommendations  Home health PT     Equipment Recommendations  None recommended by PT    Recommendations for Other Services       Precautions / Restrictions Precautions Precautions: Fall Restrictions RLE Weight Bearing: Weight bearing as tolerated    Mobility  Bed Mobility Overal bed mobility: Needs Assistance Bed Mobility: Supine to Sit     Supine to sit: Min guard     General bed mobility comments: verbal cues for self assist  Transfers Overall transfer level: Needs assistance Equipment used: Rolling walker (2 wheeled) Transfers: Sit to/from Stand Sit to Stand: Min guard         General transfer comment: min guard for safety, verbal cuing for hand placement  Ambulation/Gait Ambulation/Gait assistance: Min guard Gait Distance (Feet): 160 Feet Assistive device: Rolling walker (2 wheeled) Gait Pattern/deviations: Step-through pattern;Decreased stance time - right     General Gait Details: min/guard for safety, pt denies any dizziness, verbal cues for RW positioning; BP 116/70 bpm upon returning to Dentist    Modified Rankin (Stroke Patients Only)       Balance                                            Cognition Arousal/Alertness: Awake/alert Behavior During Therapy: WFL for  tasks assessed/performed Overall Cognitive Status: Within Functional Limits for tasks assessed                                        Exercises Total Joint Exercises Ankle Circles/Pumps: AROM;Both;10 reps;Seated Quad Sets: AROM;Right;10 reps;Supine Heel Slides: AROM;Right;15 reps;Supine Hip ABduction/ADduction: AROM;Right;10 reps;Supine Long Arc Quad: AROM;Seated;Right;10 reps    General Comments        Pertinent Vitals/Pain Pain Assessment: 0-10 Pain Score: 4  Pain Location: R hip Pain Descriptors / Indicators: Sore;Aching Pain Intervention(s): Repositioned;Monitored during session    Home Living                      Prior Function            PT Goals (current goals can now be found in the care plan section) Progress towards PT goals: Progressing toward goals    Frequency    Min 5X/week      PT Plan Current plan remains appropriate    Co-evaluation              AM-PAC PT "6 Clicks" Mobility   Outcome Measure  Help needed turning from your back to your side while in  a flat bed without using bedrails?: A Little Help needed moving from lying on your back to sitting on the side of a flat bed without using bedrails?: A Little Help needed moving to and from a bed to a chair (including a wheelchair)?: A Little Help needed standing up from a chair using your arms (e.g., wheelchair or bedside chair)?: A Little Help needed to walk in hospital room?: A Little Help needed climbing 3-5 steps with a railing? : A Little 6 Click Score: 18    End of Session Equipment Utilized During Treatment: Gait belt Activity Tolerance: Patient tolerated treatment well Patient left: in chair;with call bell/phone within reach   PT Visit Diagnosis: Other abnormalities of gait and mobility (R26.89)     Time: [id number] PT Time Calculation (min) (ACUTE ONLY): 19 min  Charges:  $Gait Training: 8-22 mins                    Carmelia Bake, PT, DPT Acute  Rehabilitation Services Office: [phone] Pager: [place] 12/21/2018, 12:36 PM

## 2018-12-23 DIAGNOSIS — I493 Ventricular premature depolarization: Secondary | ICD-10-CM | POA: Diagnosis not present

## 2018-12-23 DIAGNOSIS — E039 Hypothyroidism, unspecified: Secondary | ICD-10-CM | POA: Diagnosis not present

## 2018-12-23 DIAGNOSIS — Z95810 Presence of automatic (implantable) cardiac defibrillator: Secondary | ICD-10-CM | POA: Diagnosis not present

## 2018-12-23 DIAGNOSIS — Z87891 Personal history of nicotine dependence: Secondary | ICD-10-CM | POA: Diagnosis not present

## 2018-12-23 DIAGNOSIS — I110 Hypertensive heart disease with heart failure: Secondary | ICD-10-CM | POA: Diagnosis not present

## 2018-12-23 DIAGNOSIS — M19072 Primary osteoarthritis, left ankle and foot: Secondary | ICD-10-CM | POA: Diagnosis not present

## 2018-12-23 DIAGNOSIS — Z85030 Personal history of malignant carcinoid tumor of large intestine: Secondary | ICD-10-CM | POA: Diagnosis not present

## 2018-12-23 DIAGNOSIS — Z87440 Personal history of urinary (tract) infections: Secondary | ICD-10-CM | POA: Diagnosis not present

## 2018-12-23 DIAGNOSIS — W19XXXD Unspecified fall, subsequent encounter: Secondary | ICD-10-CM | POA: Diagnosis not present

## 2018-12-23 DIAGNOSIS — Z9049 Acquired absence of other specified parts of digestive tract: Secondary | ICD-10-CM | POA: Diagnosis not present

## 2018-12-23 DIAGNOSIS — I480 Paroxysmal atrial fibrillation: Secondary | ICD-10-CM | POA: Diagnosis not present

## 2018-12-23 DIAGNOSIS — I2510 Atherosclerotic heart disease of native coronary artery without angina pectoris: Secondary | ICD-10-CM | POA: Diagnosis not present

## 2018-12-23 DIAGNOSIS — K219 Gastro-esophageal reflux disease without esophagitis: Secondary | ICD-10-CM | POA: Diagnosis not present

## 2018-12-23 DIAGNOSIS — Z9181 History of falling: Secondary | ICD-10-CM | POA: Diagnosis not present

## 2018-12-23 DIAGNOSIS — R55 Syncope and collapse: Secondary | ICD-10-CM | POA: Diagnosis not present

## 2018-12-23 DIAGNOSIS — I429 Cardiomyopathy, unspecified: Secondary | ICD-10-CM | POA: Diagnosis not present

## 2018-12-23 DIAGNOSIS — S72011D Unspecified intracapsular fracture of right femur, subsequent encounter for closed fracture with routine healing: Secondary | ICD-10-CM | POA: Diagnosis not present

## 2018-12-23 DIAGNOSIS — Z8543 Personal history of malignant neoplasm of ovary: Secondary | ICD-10-CM | POA: Diagnosis not present

## 2018-12-23 DIAGNOSIS — Z96641 Presence of right artificial hip joint: Secondary | ICD-10-CM | POA: Diagnosis not present

## 2018-12-23 DIAGNOSIS — E785 Hyperlipidemia, unspecified: Secondary | ICD-10-CM | POA: Diagnosis not present

## 2018-12-23 DIAGNOSIS — I5022 Chronic systolic (congestive) heart failure: Secondary | ICD-10-CM | POA: Diagnosis not present

## 2018-12-23 DIAGNOSIS — Z7901 Long term (current) use of anticoagulants: Secondary | ICD-10-CM | POA: Diagnosis not present

## 2018-12-25 ENCOUNTER — Telehealth: Payer: Self-pay

## 2018-12-25 DIAGNOSIS — I5022 Chronic systolic (congestive) heart failure: Secondary | ICD-10-CM

## 2018-12-25 MED ORDER — FUROSEMIDE 20 MG PO TABS: 40.0000 mg | ORAL_TABLET | Freq: Every day | ORAL | 3 refills | Status: DC

## 2018-12-25 MED ORDER — SPIRONOLACTONE 25 MG PO TABS: 25.0000 mg | ORAL_TABLET | Freq: Every day | ORAL | 3 refills | Status: DC

## 2018-12-25 NOTE — Telephone Encounter (Signed)
She needs to start back on spironolactone 25 mg daily and Lasix 40 mg daily.  She should stay off of Entresto for now.  BMET again in 1 week.  Needs CHF clinic followup.

## 2018-12-25 NOTE — Telephone Encounter (Signed)
Spoke with patient.  Advised Dr Aundra Dubin ordered to restart on Furosemide 40 mg daily and Spironolactone 25 mg daily.  BMET will be drawn at 01/07/2019 HF clinic appointment.  She said she does not need prescriptions filled at this time since was taking these meds prior to hospital admission.  Will recheck fluid levels 12/31/2018.  Provided ICM direct number and encouraged to call for questions or concerns.

## 2018-12-25 NOTE — Telephone Encounter (Signed)
Received [facility] report and Optivol suggesting possible ongoing fluid accumulation since hospitalization 12/16/2018 and discharged on 12/21/2018.     Transmission reviewed with patient.  She has bilateral leg swelling but surgical leg, right side is worse than left.  She denies any shortness of breath and not weighing.   Lasix was stopped during hospitalization and was not restarted at discharge (see 9/4 discharge note).  Furosemide last taken before hospitalization.     12/21/2018 Creatinine 0.99, BUN 18, Potassium 4.3, Sodium 130, GFR 54->60   Post hospital OV scheduled 9/17 with HF clinic.   ICM Recheck 12/31/2018.  Recommendations:  Advised will send copy to Dr Aundra Dubin for review and recommendations if needed.     12/25/2018 ICM trend:

## 2018-12-25 NOTE — Telephone Encounter (Signed)
Per Dr Aundra Dubin, patient may have BMET drawn 01/07/2019 at the time of HF clinic appointment.

## 2018-12-25 NOTE — Telephone Encounter (Signed)
Patient referred to [facility] by Dr Rayann Heman after hospital consultation following patient's hip surgery.  She is a patient of Dr Lovena Le and Dr Oleh Genin.  Spoke with patient and provided ICM intro.  She said she is doing okay but having a lot of swelling in her leg but unsure if this related to surgery or heart.  Advised to send manual remote transmission today for review and will call her back once the transmission is received. She agreed.

## 2018-12-26 DIAGNOSIS — I429 Cardiomyopathy, unspecified: Secondary | ICD-10-CM | POA: Diagnosis not present

## 2018-12-26 DIAGNOSIS — I110 Hypertensive heart disease with heart failure: Secondary | ICD-10-CM | POA: Diagnosis not present

## 2018-12-26 DIAGNOSIS — I2510 Atherosclerotic heart disease of native coronary artery without angina pectoris: Secondary | ICD-10-CM | POA: Diagnosis not present

## 2018-12-26 DIAGNOSIS — R55 Syncope and collapse: Secondary | ICD-10-CM | POA: Diagnosis not present

## 2018-12-26 DIAGNOSIS — S72011D Unspecified intracapsular fracture of right femur, subsequent encounter for closed fracture with routine healing: Secondary | ICD-10-CM | POA: Diagnosis not present

## 2018-12-26 DIAGNOSIS — I5022 Chronic systolic (congestive) heart failure: Secondary | ICD-10-CM | POA: Diagnosis not present

## 2018-12-28 DIAGNOSIS — S72011D Unspecified intracapsular fracture of right femur, subsequent encounter for closed fracture with routine healing: Secondary | ICD-10-CM | POA: Diagnosis not present

## 2018-12-28 DIAGNOSIS — R55 Syncope and collapse: Secondary | ICD-10-CM | POA: Diagnosis not present

## 2018-12-28 DIAGNOSIS — I5022 Chronic systolic (congestive) heart failure: Secondary | ICD-10-CM | POA: Diagnosis not present

## 2018-12-28 DIAGNOSIS — I110 Hypertensive heart disease with heart failure: Secondary | ICD-10-CM | POA: Diagnosis not present

## 2018-12-28 DIAGNOSIS — I2510 Atherosclerotic heart disease of native coronary artery without angina pectoris: Secondary | ICD-10-CM | POA: Diagnosis not present

## 2018-12-28 DIAGNOSIS — I429 Cardiomyopathy, unspecified: Secondary | ICD-10-CM | POA: Diagnosis not present

## 2018-12-30 ENCOUNTER — Other Ambulatory Visit: Payer: Self-pay | Admitting: Internal Medicine

## 2018-12-31 ENCOUNTER — Other Ambulatory Visit: Payer: Self-pay | Admitting: *Deleted

## 2018-12-31 ENCOUNTER — Ambulatory Visit (INDEPENDENT_AMBULATORY_CARE_PROVIDER_SITE_OTHER): Payer: Medicare Other

## 2018-12-31 DIAGNOSIS — I5022 Chronic systolic (congestive) heart failure: Secondary | ICD-10-CM

## 2018-12-31 DIAGNOSIS — Z95810 Presence of automatic (implantable) cardiac defibrillator: Secondary | ICD-10-CM

## 2018-12-31 MED ORDER — APIXABAN 5 MG PO TABS: 5.0000 mg | ORAL_TABLET | Freq: Two times a day (BID) | ORAL | 10 refills | Status: DC

## 2018-12-31 NOTE — Telephone Encounter (Signed)
Eliquis 5mg  refill request received; pt is 83yrs old, weight-64kg, Crea-0.99 on 12/21/2018, Diagnosis-Afib, and last seen by Lovena Le on 12/10/2018. Dose is appropriate based on dosing criteria. Will send in refill to requested pharmacy.

## 2018-12-31 NOTE — Progress Notes (Signed)
EPIC Encounter for ICM Monitoring  Patient Name: Cynthia Frank is a 83 y.o. female Date: 12/31/2018 Primary Care Physican: [name], MD Primary Cardiologist: Aundra Dubin Electrophysiologist: Santina Evans Pacing:   98.3%  12/31/2018 Weight: 138 lbs        Heart Failure questions reviewed.  Pt reported she lost >10 lbs after restarting Furosemide and feels so much better.  Strength is improving after total hip surgery.     Opitvol thoracic impedance returned to normal after starting Furosemide.  Prescribed: Furosemide 20 mg take 2 tablets (40 mg total) every day.   Labs:       12/21/2018 Creatinine 0.99, BUN 18, Potassium 4.3, Sodium 130, GFR 54->60  Recommendations:   Encouraged to call if experiencing fluid symptoms.  Follow-up plan: ICM clinic phone appointment on 01/30/2019.   91 day device clinic remote transmission 01/29/2019.  Office appt 01/07/2019 with Dr. Aundra Dubin.    Copy of ICM check sent to Dr. Lovena Le.   3 month ICM trend: 12/31/2018    1 Year ICM trend:       Rosalene Billings, RN 12/31/2018 2:34 PM

## 2019-01-01 ENCOUNTER — Other Ambulatory Visit: Payer: Self-pay | Admitting: *Deleted

## 2019-01-01 DIAGNOSIS — Z23 Encounter for immunization: Secondary | ICD-10-CM | POA: Diagnosis not present

## 2019-01-01 DIAGNOSIS — S72031D Displaced midcervical fracture of right femur, subsequent encounter for closed fracture with routine healing: Secondary | ICD-10-CM | POA: Diagnosis not present

## 2019-01-01 MED ORDER — CITALOPRAM HYDROBROMIDE 20 MG PO TABS: ORAL_TABLET | ORAL | 0 refills | Status: DC

## 2019-01-02 ENCOUNTER — Other Ambulatory Visit: Payer: Self-pay | Admitting: Student

## 2019-01-02 DIAGNOSIS — I429 Cardiomyopathy, unspecified: Secondary | ICD-10-CM | POA: Diagnosis not present

## 2019-01-02 DIAGNOSIS — I5022 Chronic systolic (congestive) heart failure: Secondary | ICD-10-CM

## 2019-01-02 DIAGNOSIS — I110 Hypertensive heart disease with heart failure: Secondary | ICD-10-CM | POA: Diagnosis not present

## 2019-01-02 DIAGNOSIS — R55 Syncope and collapse: Secondary | ICD-10-CM | POA: Diagnosis not present

## 2019-01-02 DIAGNOSIS — I2510 Atherosclerotic heart disease of native coronary artery without angina pectoris: Secondary | ICD-10-CM | POA: Diagnosis not present

## 2019-01-02 DIAGNOSIS — S72011D Unspecified intracapsular fracture of right femur, subsequent encounter for closed fracture with routine healing: Secondary | ICD-10-CM | POA: Diagnosis not present

## 2019-01-04 DIAGNOSIS — I2510 Atherosclerotic heart disease of native coronary artery without angina pectoris: Secondary | ICD-10-CM | POA: Diagnosis not present

## 2019-01-04 DIAGNOSIS — R55 Syncope and collapse: Secondary | ICD-10-CM | POA: Diagnosis not present

## 2019-01-04 DIAGNOSIS — I110 Hypertensive heart disease with heart failure: Secondary | ICD-10-CM | POA: Diagnosis not present

## 2019-01-04 DIAGNOSIS — I429 Cardiomyopathy, unspecified: Secondary | ICD-10-CM | POA: Diagnosis not present

## 2019-01-04 DIAGNOSIS — S72011D Unspecified intracapsular fracture of right femur, subsequent encounter for closed fracture with routine healing: Secondary | ICD-10-CM | POA: Diagnosis not present

## 2019-01-04 DIAGNOSIS — I5022 Chronic systolic (congestive) heart failure: Secondary | ICD-10-CM | POA: Diagnosis not present

## 2019-01-06 NOTE — Progress Notes (Signed)
Date:  01/07/2019   ID:  [name], DOB 1935-12-20, MRN [id number]  Provider location: [facility] Type of Visit: Established patient   PCP:  [name], MD  Cardiologist:  Dr. Aundra Dubin  Chief Complaint: Lightheadedness   History of Present Illness: Cynthia Frank is a 83 y.o. female who has a history of chronic systolic CHF/nonischemic cardiomyopathy, paroxysmal atrial fibrillation, and prior ovarian and colon cancers. She has had a cardiomyopathy known for > 15 years now. Cardiac cath at diagnosis showed mild nonobstructive disease.  EF has been persistently low.  Cause has been thought to be Adriamycin; she received this as part of her ovarian cancer treatment. She has had paroxysmal atrial fibrillation and has remained in NSR with amiodarone use.  No recent atrial fibrillation symptoms, typically feels prolonged palpitations thought to be atrial fibrillation 2-3 times/year.   She had had dyspnea with moderate to heavy exertion for 4-5 years prior to initial appt.  However, just before her initial appt, her symptoms had worsened.  She developed shortness of breath walking short distances on flat ground.  Lasix was increased to 40 mg bid and she lost weight and felt better.     Cardiac MRI was done in 1/17, showing EF 29% with normal RV.  Septal-lateral dyssynchrony was present and there was a non-coronary pattern of LGE in the septum.  LFTs were noted to be mildly elevated.  Amiodarone was decreased to 100 mg daily.   She had Medtronic CRT-D placed in 3/17. Repeat echo 8/17 showed EF 35% with diffuse hypokinesis, normal RV.  Echo in 4/19 showed EF 30-35%, mild AI.   On 11/12/17, she stood up, got lightheaded and passed out briefly with a fall.  Her ICD did not discharge.  She went to the ER and was found to have right C5 transverse process fracture.  She is wearing a cervical collar.  She says that she has had lightheadedness with standing  for years, comes and goes.  She does not feel like she has been more lightheaded than is her norm, but that one episode on 11/12/17 led to syncope.  I decreased her Toprol XL.   Admitted to [facility] on 12/16/18 after syncopal episode resulting in right hip fracture. Lasix, spiro, and entresto stopped. ECHO was reseated and showed EF 45-50%. Discharge weight was 154 pounds.   On 12/25/18 she was instructed to start 25 mg spiro and lasix 40 mg daily due to elevated fluid index noted on Optivol. .  Today she returns for HF follow up. She has been feeling ok but on Friday /Sat/Sun she had some shortness of breath. Mild dyspnea with exertion. Denies PND/Orthopnea. Appetite ok. No fever or chills. Weight at home has gone down form 154 -->135 pounds. Taking all medications.  Medtronic device interrogation:Fluid index flat, impedance up, BiV pacing down to ~ 50%. AT/AF 9-18- 9/21    Labs (1/17): K 4.2, creatinine 1.14, BNP 855 Labs (3/17): SPEP negative, BNP 341, K 4.5, creatinine 1.27, AST 62, ALT 74, TSH normal with low free T3 and normal free T4, HCT 46.5 Labs (4/17): AST 64, ALT 67, BNP 278, K 3.6, creatinine 1.21 Labs (5/17): K 3.4, creatinine 1.39, AST 53, ALT 61, HBV/HCV negative, TSH mildly elevated, BNP 306 Labs (6/17): K 4.4, creatinine 1.36, free T4 normal, free T3 mildly decreased, AST 50, ALT 53. Labs (7/17): ANA negative, ASMA positive Labs (8/17): AST 45, ALT 37, K 3.8, creatinine 1.19, TSH  normal Labs (2/18): TSH normal, LDL 47, LFTs, normal Labs (3/18): K normal, creatinine 1.23.  Labs (7/18): K 4, creatinine 1.32, LFTs normal Labs (7/19): K 3.8, creatinine 1.25, hgb 11.6 Labs (8/19): TSH mildly elevated, K 4.2, creatinine 1.22 Labs (10/19): K 4.1, creatinine 1.23, LFTs normal, TSH mildly elevated with normal free T3 and free T4.  Labs (1/20): K 3.5, creatinine 1.15, TSH elevated, free T3 and free T4 low, AST 63, ALT 51 Labs (5/20): K 3.8, creatinine 1.22, TSH normal, AST 49, ALT 43, tbili 0.9   PMH: 1. Chronic systolic CHF: Nonischemic cardiomyopathy, thought to be related to adriamycin that she had for treatment of ovarian cancer.  NICM diagnosed ~ 2001.  - LHC ~ 2001 with 30% stenosis D1.   - Echo (2013) EF 25-30%.  - Echo (10/14) with EF 25-30%. - Echo (12/16) with EF 20-25%, mild LV dilation, restrictive diastolic function, moderate MR, severe LAE. - Cardiac MRI (1/17): Moderately dilated LV with EF 29%. Diffuse hypokinesis looking worse in the septal wall. Septal-lateral dyssynchrony. Normal RV size and systolic function.  Moderate MR with tethered posterior leaflet.  Mid-wall LGE in the basal to mid septum. This is not a coronary disease pattern. Could be suggestive of prior myocarditis. - Medtronic CRT-D placed 3/17.  - Echo (8/17): EF 35%, diffuse hypokinesis, normal RV size and systolic function, moderate MR.  - Echo (4/19): EF 30-35%, mild AI.  2. Atrial fibrillation: Paroxysmal.  3. HTN 4. Ovarian cancer: 1985, s/p surgery. Received Adriamycin-based chemotherapy.  5. Colon cancer: 2007, s/p surgery.  6. PVCs 7. GERD 8. PFTs (8/16) without significant abnormality. 9. Prior syncope 10. Elevated transaminases: Uncertain etiology.   Current Outpatient Medications  Medication Sig Dispense Refill  . amiodarone (PACERONE) 200 MG tablet TAKE 1 TABLET BY MOUTH DAILY (Patient taking differently: Take 200 mg by mouth daily. ) 90 tablet 3  . apixaban (ELIQUIS) 5 MG TABS tablet Take 1 tablet (5 mg total) by mouth 2 (two) times daily. 60 tablet 10  . atorvastatin (LIPITOR) 40 MG tablet Take 1 tablet (40 mg total) by mouth daily at 6 PM. 90 tablet 3  . citalopram (CELEXA) 20 MG tablet TAKE 1 TABLET(20 MG) BY MOUTH DAILY **NEEDS OFFICE VISIT FOR FUTURE REFILLS** 30 tablet 0  . Coenzyme Q10 (COQ10) 100 MG CAPS Take 100 mg by mouth daily.     Marland Kitchen conjugated estrogens (PREMARIN) vaginal cream Place 1 Applicatorful vaginally as needed (dyspareunia (Use as directed)). Reported on  05/11/2015    . diclofenac sodium (VOLTAREN) 1 % GEL APPLY 2 GRAMS TOPICALLY 4 TIMES DAILY (Patient taking differently: Apply 2 g topically 4 (four) times daily. ) 100 g 0  . furosemide (LASIX) 20 MG tablet Take 2 tablets (40 mg total) by mouth daily. 180 tablet 3  . levothyroxine (SYNTHROID) 25 MCG tablet TAKE 1 TABLET(25 MCG) BY MOUTH DAILY BEFORE BREAKFAST (Patient taking differently: Take 25 mcg by mouth daily before breakfast. ) 90 tablet 3  . Magnesium Oxide 500 MG CAPS Take 2,000 mg by mouth daily.    . metoprolol succinate (TOPROL-XL) 25 MG 24 hr tablet Take 0.5 tablets (12.5 mg total) by mouth daily. 60 tablet 0  . spironolactone (ALDACTONE) 25 MG tablet Take 1 tablet (25 mg total) by mouth daily. 90 tablet 3  . zolpidem (AMBIEN) 5 MG tablet TAKE 1 TABLET BY MOUTH EVERY DAY AT BEDTIME AS NEEDED (Patient taking differently: Take 5 mg by mouth at bedtime as needed for sleep. ) 30  tablet 0   No current facility-administered medications for this encounter.     Allergies:   Clindamycin/lincomycin, Dramamine ii [meclizine hcl], Pneumococcal vaccines, and Sulfonamide derivatives   Social History:  The patient  reports that she quit smoking about 40 years ago. Her smoking use included cigarettes. She has a 10.00 pack-year smoking history. She has never used smokeless tobacco. She reports current alcohol use of about 14.0 standard drinks of alcohol per week. She reports that she does not use drugs.   Family History:  The patient's family history includes Heart attack in her father; Heart disease in her father; Prostate cancer in her father; Uterine cancer in her mother.   ROS:  Please see the history of present illness.   All other systems are personally reviewed and negative.   Exam:  BP 123/84   Pulse 80   Wt 62.2 kg (137 lb 3.2 oz)   SpO2 98%   BMI 22.83 kg/m  General:  Well appearing. No resp difficulty. Walked in with a walker.  HEENT: normal Neck: supple. no JVD. Carotids 2+ bilat;  no bruits. No lymphadenopathy or thryomegaly appreciated. Cor: PMI nondisplaced. Irregular rate & rhythm. No rubs, gallops or murmurs. Lungs: clear Abdomen: soft, nontender, nondistended. No hepatosplenomegaly. No bruits or masses. Good bowel sounds. Extremities: no cyanosis, clubbing, rash, edema Neuro: alert & orientedx3, cranial nerves grossly intact. moves all 4 extremities w/o difficulty. Affect pleasant  EKG A fib 71 bpm  Recent Labs: 11/30/2018: TSH 2.465 12/16/2018: ALT 66 12/21/2018: BUN 18; Creatinine, Ser 0.99; Hemoglobin 8.6; Magnesium 2.2; Platelets 130; Potassium 4.3; Sodium 130  Personally reviewed   Wt Readings from Last 3 Encounters:  01/07/19 62.2 kg (137 lb 3.2 oz)  12/16/18 64 kg (141 lb 1.5 oz)  12/10/18 64 kg (141 lb)    ASSESSMENT AND PLAN:  1. Chronic systolic CHF: EF 0000000 with diffuse hypokinesis on 12/16 echo.  Nonischemic cardiomyopathy, probably related to Adriamycin with ovarian cancer treatment.  Cardiac MRI in 1/17 showed EF 29% with normal RV and septal-lateral dyssynchrony.  There was a non-cardiac pattern of LGE in the septum, cannot rule out prior myocarditis based on this pattern.  She has a Medtronic CRT-D device.  Echo 4/19 with EF 30-35%.   ECHO 11/2018 EF 40-45%  -NYHA IIIb. Volume status stable. Continue lasix 40 mg daily. Instructed to hold lasix if weight is < 135 pounds.  - Continue Toprol XL 12.5 mg daily. Recently cut back with syncopal episode.   - Continue spironolactone 25 daily.   - Off entresto with recent sycope.   2. Atrial fibrillation: Paroxysmal. Back in A fib since 9/18.  - Increase amio to 200 mg twice a day.  - Continue eliquis 5 mg twice a day.  - Plan to repeat EKG next week. If she is still in A Fib next week she will need TEE/DC-CV. Anticoagulation interrupted with recent hip surgery.  - She will need yearly eye exams. .   3. Hypothyroidism: Suspect related to amiodarone use.   - Continue Levoxyl.   4. Syncope 11/2018  Suspect due to over diuresis.  Not arrhythmia related.    Greater than 50% of the (total minutes 40)  visit spent in counseling/coordination of care regarding the above medication changes, testing, and optivol results.   Follow up next week for an EKG.     SignedDarrick Grinder, NP  01/07/2019  [facility] [address] [facility] [place] [address] [phone]  [phone] (office) [phone] (fax)

## 2019-01-07 ENCOUNTER — Ambulatory Visit (HOSPITAL_COMMUNITY)
Admission: RE | Admit: 2019-01-07 | Discharge: 2019-01-07 | Disposition: A | Discharge status: Home / Self Care | Payer: Medicare Other | Source: Ambulatory Visit | Attending: Adult Health | Admitting: Adult Health

## 2019-01-07 ENCOUNTER — Encounter (HOSPITAL_COMMUNITY): Payer: Self-pay

## 2019-01-07 ENCOUNTER — Other Ambulatory Visit: Payer: Self-pay

## 2019-01-07 ENCOUNTER — Telehealth (HOSPITAL_COMMUNITY): Payer: Self-pay | Admitting: Cardiology

## 2019-01-07 VITALS — BP 123/84 | HR 80 | Wt 137.2 lb

## 2019-01-07 DIAGNOSIS — I480 Paroxysmal atrial fibrillation: Secondary | ICD-10-CM | POA: Diagnosis not present

## 2019-01-07 DIAGNOSIS — Z882 Allergy status to sulfonamides status: Secondary | ICD-10-CM | POA: Insufficient documentation

## 2019-01-07 DIAGNOSIS — Z8042 Family history of malignant neoplasm of prostate: Secondary | ICD-10-CM | POA: Insufficient documentation

## 2019-01-07 DIAGNOSIS — Z888 Allergy status to other drugs, medicaments and biological substances status: Secondary | ICD-10-CM | POA: Insufficient documentation

## 2019-01-07 DIAGNOSIS — E039 Hypothyroidism, unspecified: Secondary | ICD-10-CM | POA: Insufficient documentation

## 2019-01-07 DIAGNOSIS — Z8249 Family history of ischemic heart disease and other diseases of the circulatory system: Secondary | ICD-10-CM | POA: Insufficient documentation

## 2019-01-07 DIAGNOSIS — Z95810 Presence of automatic (implantable) cardiac defibrillator: Secondary | ICD-10-CM

## 2019-01-07 DIAGNOSIS — R946 Abnormal results of thyroid function studies: Secondary | ICD-10-CM | POA: Diagnosis not present

## 2019-01-07 DIAGNOSIS — I5022 Chronic systolic (congestive) heart failure: Secondary | ICD-10-CM | POA: Diagnosis not present

## 2019-01-07 DIAGNOSIS — I110 Hypertensive heart disease with heart failure: Secondary | ICD-10-CM | POA: Diagnosis not present

## 2019-01-07 DIAGNOSIS — Z79899 Other long term (current) drug therapy: Secondary | ICD-10-CM | POA: Insufficient documentation

## 2019-01-07 DIAGNOSIS — I428 Other cardiomyopathies: Secondary | ICD-10-CM | POA: Diagnosis not present

## 2019-01-07 DIAGNOSIS — Z8049 Family history of malignant neoplasm of other genital organs: Secondary | ICD-10-CM | POA: Insufficient documentation

## 2019-01-07 DIAGNOSIS — Z881 Allergy status to other antibiotic agents status: Secondary | ICD-10-CM | POA: Diagnosis not present

## 2019-01-07 DIAGNOSIS — R55 Syncope and collapse: Secondary | ICD-10-CM | POA: Diagnosis not present

## 2019-01-07 DIAGNOSIS — Z8543 Personal history of malignant neoplasm of ovary: Secondary | ICD-10-CM | POA: Diagnosis not present

## 2019-01-07 DIAGNOSIS — Z79890 Hormone replacement therapy (postmenopausal): Secondary | ICD-10-CM | POA: Diagnosis not present

## 2019-01-07 DIAGNOSIS — Z87891 Personal history of nicotine dependence: Secondary | ICD-10-CM | POA: Diagnosis not present

## 2019-01-07 DIAGNOSIS — Z7901 Long term (current) use of anticoagulants: Secondary | ICD-10-CM | POA: Insufficient documentation

## 2019-01-07 DIAGNOSIS — Z85038 Personal history of other malignant neoplasm of large intestine: Secondary | ICD-10-CM | POA: Insufficient documentation

## 2019-01-07 DIAGNOSIS — Z791 Long term (current) use of non-steroidal anti-inflammatories (NSAID): Secondary | ICD-10-CM | POA: Diagnosis not present

## 2019-01-07 DIAGNOSIS — Z887 Allergy status to serum and vaccine status: Secondary | ICD-10-CM | POA: Diagnosis not present

## 2019-01-07 LAB — CBC
HCT: 37.3 % (ref 36.0–46.0)
Hemoglobin: 12.4 g/dL (ref 12.0–15.0)
MCH: 32.2 pg (ref 26.0–34.0)
MCHC: 33.2 g/dL (ref 30.0–36.0)
MCV: 96.9 fL (ref 80.0–100.0)
Platelets: 205 10*3/uL (ref 150–400)
RBC: 3.85 MIL/uL — ABNORMAL LOW (ref 3.87–5.11)
RDW: 14.1 % (ref 11.5–15.5)
WBC: 6.2 10*3/uL (ref 4.0–10.5)
nRBC: 0 % (ref 0.0–0.2)

## 2019-01-07 LAB — BASIC METABOLIC PANEL
Anion gap: 12 (ref 5–15)
BUN: 16 mg/dL (ref 8–23)
CO2: 25 mmol/L (ref 22–32)
Calcium: 9.1 mg/dL (ref 8.9–10.3)
Chloride: 99 mmol/L (ref 98–111)
Creatinine, Ser: 1.18 mg/dL — ABNORMAL HIGH (ref 0.44–1.00)
GFR calc Af Amer: 50 mL/min — ABNORMAL LOW (ref >60)
GFR calc non Af Amer: 43 mL/min — ABNORMAL LOW (ref >60)
Glucose, Bld: 122 mg/dL — ABNORMAL HIGH (ref 70–99)
Potassium: 3.1 mmol/L — ABNORMAL LOW (ref 3.5–5.1)
Sodium: 136 mmol/L (ref 135–145)

## 2019-01-07 MED ORDER — AMIODARONE HCL 200 MG PO TABS: 200.0000 mg | ORAL_TABLET | Freq: Two times a day (BID) | ORAL | 3 refills | Status: DC

## 2019-01-07 MED ORDER — POTASSIUM CHLORIDE CRYS ER 20 MEQ PO TBCR: 20.0000 meq | EXTENDED_RELEASE_TABLET | Freq: Two times a day (BID) | ORAL | 3 refills | Status: DC

## 2019-01-07 MED ORDER — FUROSEMIDE 20 MG PO TABS: 40.0000 mg | ORAL_TABLET | Freq: Two times a day (BID) | ORAL | 3 refills | Status: DC

## 2019-01-07 NOTE — Patient Instructions (Addendum)
EKG done today.   HOLD Lasix for the day if your morning weight is 134 lbs or less. INCREASE Amiodarone to 200 mg, one tab twice daily   Labs today We will only contact you if something comes back abnormal or we need to make some changes. Otherwise no news is good news!   Return to clinic for a nurse visit in 2 days  Keep follow up as scheduled with Dr Aundra Dubin  Do the following things EVERYDAY: 1) Weigh yourself in the morning before breakfast. Write it down and keep it in a log. 2) Take your medicines as prescribed 3) Eat low salt foods-Limit salt (sodium) to 2000 mg per day.  4) Stay as active as you can everyday 5) Limit all fluids for the day to less than 2 liters   At the [facility], you and your health needs are our priority. As part of our continuing mission to provide you with exceptional heart care, we have created designated Provider Care Teams. These Care Teams include your primary Cardiologist (physician) and Advanced Practice Providers (APPs- Physician Assistants and Nurse Practitioners) who all work together to provide you with the care you need, when you need it.   You may see any of the following providers on your designated Care Team at your next follow up: Marland Kitchen Dr Glori Bickers . Dr [name] . Darrick Grinder, NP   Please be sure to bring in all your medications bottles to every appointment.

## 2019-01-07 NOTE — Telephone Encounter (Signed)
Notes recorded by Kerry Dory, CMA on 01/07/2019 at 4:08 PM EDT  Pt aware  ------   Notes recorded by Darrick Grinder D, NP on 01/07/2019 at 12:49 PM EDT  Please call add 20 meq potassium twice a day. Potassium low. Repeat BMET next week at her follow up.

## 2019-01-07 NOTE — Telephone Encounter (Signed)
-----   Message from Conrad La Pine, NP sent at 01/07/2019 12:49 PM EDT ----- Please call add 20 meq potassium twice a day. Potassium low. Repeat BMET next week at her follow up.

## 2019-01-09 ENCOUNTER — Ambulatory Visit (HOSPITAL_COMMUNITY)
Admission: RE | Admit: 2019-01-09 | Discharge: 2019-01-09 | Disposition: A | Discharge status: Home / Self Care | Payer: Medicare Other | Source: Ambulatory Visit | Attending: Adult Health | Admitting: Adult Health

## 2019-01-09 ENCOUNTER — Other Ambulatory Visit: Payer: Self-pay

## 2019-01-09 ENCOUNTER — Encounter (HOSPITAL_COMMUNITY): Payer: Self-pay

## 2019-01-09 VITALS — BP 116/76 | HR 67 | Wt 138.6 lb

## 2019-01-09 DIAGNOSIS — I5022 Chronic systolic (congestive) heart failure: Secondary | ICD-10-CM | POA: Insufficient documentation

## 2019-01-09 DIAGNOSIS — I2510 Atherosclerotic heart disease of native coronary artery without angina pectoris: Secondary | ICD-10-CM | POA: Diagnosis not present

## 2019-01-09 DIAGNOSIS — Z950 Presence of cardiac pacemaker: Secondary | ICD-10-CM | POA: Insufficient documentation

## 2019-01-09 DIAGNOSIS — I110 Hypertensive heart disease with heart failure: Secondary | ICD-10-CM | POA: Diagnosis not present

## 2019-01-09 DIAGNOSIS — R55 Syncope and collapse: Secondary | ICD-10-CM | POA: Diagnosis not present

## 2019-01-09 DIAGNOSIS — S72011D Unspecified intracapsular fracture of right femur, subsequent encounter for closed fracture with routine healing: Secondary | ICD-10-CM | POA: Diagnosis not present

## 2019-01-09 DIAGNOSIS — I429 Cardiomyopathy, unspecified: Secondary | ICD-10-CM | POA: Diagnosis not present

## 2019-01-09 LAB — BASIC METABOLIC PANEL
Anion gap: 10 (ref 5–15)
BUN: 17 mg/dL (ref 8–23)
CO2: 22 mmol/L (ref 22–32)
Calcium: 8.7 mg/dL — ABNORMAL LOW (ref 8.9–10.3)
Chloride: 101 mmol/L (ref 98–111)
Creatinine, Ser: 1.26 mg/dL — ABNORMAL HIGH (ref 0.44–1.00)
GFR calc Af Amer: 46 mL/min — ABNORMAL LOW (ref >60)
GFR calc non Af Amer: 40 mL/min — ABNORMAL LOW (ref >60)
Glucose, Bld: 125 mg/dL — ABNORMAL HIGH (ref 70–99)
Potassium: 4.8 mmol/L (ref 3.5–5.1)
Sodium: 133 mmol/L — ABNORMAL LOW (ref 135–145)

## 2019-01-09 NOTE — Progress Notes (Addendum)
Pt arrived for nurse visit today. The purpose of this visit was to obtain EKG to see if pt is still in Afib and to obtain follow up lab work in order to review potassium after med changes. Pt states that she is feeling fine. Vitals stable. EKG obtain and reviewed by Dr. Aundra Dubin.

## 2019-01-11 DIAGNOSIS — R55 Syncope and collapse: Secondary | ICD-10-CM | POA: Diagnosis not present

## 2019-01-11 DIAGNOSIS — I5022 Chronic systolic (congestive) heart failure: Secondary | ICD-10-CM | POA: Diagnosis not present

## 2019-01-11 DIAGNOSIS — I429 Cardiomyopathy, unspecified: Secondary | ICD-10-CM | POA: Diagnosis not present

## 2019-01-11 DIAGNOSIS — I110 Hypertensive heart disease with heart failure: Secondary | ICD-10-CM | POA: Diagnosis not present

## 2019-01-11 DIAGNOSIS — S72011D Unspecified intracapsular fracture of right femur, subsequent encounter for closed fracture with routine healing: Secondary | ICD-10-CM | POA: Diagnosis not present

## 2019-01-11 DIAGNOSIS — I2510 Atherosclerotic heart disease of native coronary artery without angina pectoris: Secondary | ICD-10-CM | POA: Diagnosis not present

## 2019-01-13 ENCOUNTER — Other Ambulatory Visit (HOSPITAL_COMMUNITY): Payer: Self-pay | Admitting: Cardiology

## 2019-01-14 DIAGNOSIS — I2510 Atherosclerotic heart disease of native coronary artery without angina pectoris: Secondary | ICD-10-CM | POA: Diagnosis not present

## 2019-01-14 DIAGNOSIS — I429 Cardiomyopathy, unspecified: Secondary | ICD-10-CM | POA: Diagnosis not present

## 2019-01-14 DIAGNOSIS — S72011D Unspecified intracapsular fracture of right femur, subsequent encounter for closed fracture with routine healing: Secondary | ICD-10-CM | POA: Diagnosis not present

## 2019-01-14 DIAGNOSIS — R55 Syncope and collapse: Secondary | ICD-10-CM | POA: Diagnosis not present

## 2019-01-14 DIAGNOSIS — I5022 Chronic systolic (congestive) heart failure: Secondary | ICD-10-CM | POA: Diagnosis not present

## 2019-01-14 DIAGNOSIS — I110 Hypertensive heart disease with heart failure: Secondary | ICD-10-CM | POA: Diagnosis not present

## 2019-01-17 DIAGNOSIS — S72011D Unspecified intracapsular fracture of right femur, subsequent encounter for closed fracture with routine healing: Secondary | ICD-10-CM | POA: Diagnosis not present

## 2019-01-17 DIAGNOSIS — I2510 Atherosclerotic heart disease of native coronary artery without angina pectoris: Secondary | ICD-10-CM | POA: Diagnosis not present

## 2019-01-17 DIAGNOSIS — I5022 Chronic systolic (congestive) heart failure: Secondary | ICD-10-CM | POA: Diagnosis not present

## 2019-01-17 DIAGNOSIS — R55 Syncope and collapse: Secondary | ICD-10-CM | POA: Diagnosis not present

## 2019-01-17 DIAGNOSIS — I429 Cardiomyopathy, unspecified: Secondary | ICD-10-CM | POA: Diagnosis not present

## 2019-01-17 DIAGNOSIS — I110 Hypertensive heart disease with heart failure: Secondary | ICD-10-CM | POA: Diagnosis not present

## 2019-01-22 DIAGNOSIS — I5022 Chronic systolic (congestive) heart failure: Secondary | ICD-10-CM | POA: Diagnosis not present

## 2019-01-22 DIAGNOSIS — Z8543 Personal history of malignant neoplasm of ovary: Secondary | ICD-10-CM | POA: Diagnosis not present

## 2019-01-22 DIAGNOSIS — Z9049 Acquired absence of other specified parts of digestive tract: Secondary | ICD-10-CM | POA: Diagnosis not present

## 2019-01-22 DIAGNOSIS — Z85030 Personal history of malignant carcinoid tumor of large intestine: Secondary | ICD-10-CM | POA: Diagnosis not present

## 2019-01-22 DIAGNOSIS — I2510 Atherosclerotic heart disease of native coronary artery without angina pectoris: Secondary | ICD-10-CM | POA: Diagnosis not present

## 2019-01-22 DIAGNOSIS — Z87440 Personal history of urinary (tract) infections: Secondary | ICD-10-CM | POA: Diagnosis not present

## 2019-01-22 DIAGNOSIS — W19XXXD Unspecified fall, subsequent encounter: Secondary | ICD-10-CM | POA: Diagnosis not present

## 2019-01-22 DIAGNOSIS — Z95810 Presence of automatic (implantable) cardiac defibrillator: Secondary | ICD-10-CM | POA: Diagnosis not present

## 2019-01-22 DIAGNOSIS — K219 Gastro-esophageal reflux disease without esophagitis: Secondary | ICD-10-CM | POA: Diagnosis not present

## 2019-01-22 DIAGNOSIS — Z87891 Personal history of nicotine dependence: Secondary | ICD-10-CM | POA: Diagnosis not present

## 2019-01-22 DIAGNOSIS — I493 Ventricular premature depolarization: Secondary | ICD-10-CM | POA: Diagnosis not present

## 2019-01-22 DIAGNOSIS — Z96641 Presence of right artificial hip joint: Secondary | ICD-10-CM | POA: Diagnosis not present

## 2019-01-22 DIAGNOSIS — Z7901 Long term (current) use of anticoagulants: Secondary | ICD-10-CM | POA: Diagnosis not present

## 2019-01-22 DIAGNOSIS — R55 Syncope and collapse: Secondary | ICD-10-CM | POA: Diagnosis not present

## 2019-01-22 DIAGNOSIS — Z9181 History of falling: Secondary | ICD-10-CM | POA: Diagnosis not present

## 2019-01-22 DIAGNOSIS — M19072 Primary osteoarthritis, left ankle and foot: Secondary | ICD-10-CM | POA: Diagnosis not present

## 2019-01-22 DIAGNOSIS — S72011D Unspecified intracapsular fracture of right femur, subsequent encounter for closed fracture with routine healing: Secondary | ICD-10-CM | POA: Diagnosis not present

## 2019-01-22 DIAGNOSIS — E785 Hyperlipidemia, unspecified: Secondary | ICD-10-CM | POA: Diagnosis not present

## 2019-01-22 DIAGNOSIS — I480 Paroxysmal atrial fibrillation: Secondary | ICD-10-CM | POA: Diagnosis not present

## 2019-01-22 DIAGNOSIS — E039 Hypothyroidism, unspecified: Secondary | ICD-10-CM | POA: Diagnosis not present

## 2019-01-22 DIAGNOSIS — I110 Hypertensive heart disease with heart failure: Secondary | ICD-10-CM | POA: Diagnosis not present

## 2019-01-22 DIAGNOSIS — I429 Cardiomyopathy, unspecified: Secondary | ICD-10-CM | POA: Diagnosis not present

## 2019-01-23 DIAGNOSIS — I2510 Atherosclerotic heart disease of native coronary artery without angina pectoris: Secondary | ICD-10-CM | POA: Diagnosis not present

## 2019-01-23 DIAGNOSIS — I5022 Chronic systolic (congestive) heart failure: Secondary | ICD-10-CM | POA: Diagnosis not present

## 2019-01-23 DIAGNOSIS — S72011D Unspecified intracapsular fracture of right femur, subsequent encounter for closed fracture with routine healing: Secondary | ICD-10-CM | POA: Diagnosis not present

## 2019-01-23 DIAGNOSIS — R55 Syncope and collapse: Secondary | ICD-10-CM | POA: Diagnosis not present

## 2019-01-23 DIAGNOSIS — I110 Hypertensive heart disease with heart failure: Secondary | ICD-10-CM | POA: Diagnosis not present

## 2019-01-23 DIAGNOSIS — I429 Cardiomyopathy, unspecified: Secondary | ICD-10-CM | POA: Diagnosis not present

## 2019-01-28 ENCOUNTER — Ambulatory Visit (INDEPENDENT_AMBULATORY_CARE_PROVIDER_SITE_OTHER): Payer: Medicare Other

## 2019-01-28 ENCOUNTER — Telehealth: Payer: Self-pay

## 2019-01-28 DIAGNOSIS — Z95810 Presence of automatic (implantable) cardiac defibrillator: Secondary | ICD-10-CM

## 2019-01-28 DIAGNOSIS — I5022 Chronic systolic (congestive) heart failure: Secondary | ICD-10-CM | POA: Diagnosis not present

## 2019-01-28 NOTE — Progress Notes (Signed)
EPIC Encounter for ICM Monitoring  Patient Name: Cynthia Frank is a 83 y.o. female Date: 01/28/2019 Primary Care Physican: [name], MD Primary Cardiologist: Aundra Dubin Electrophysiologist: Santina Evans Pacing:   95.6%       12/31/2018 Weight: 138 lbs   Clinical Status (31-Dec-2018 to 28-Jan-2019) Last AT/AF episode per report was 9/21. AT/AF                          127 episodes Time in AT/AF  1.7 hr/day (7.1%) Longest AT/AF  2 hours                                                           Attempted call to patient and unable to reach. Transmission reviewed.    Opitvol thoracic impedance suggesting possible fluid accumulation since 01/07/2019 but returned to baseline 10/12.  Prescribed: Furosemide 20 mg take 2 tablets (40 mg total) every day. Hold if weight is 134 or less  Labs: 01/09/2019 Creatinine 1.26, BUN 17, Potassium 4.8, Sodium 133, GFR 40-46 01/07/2019 Creatinine 1.18, BUN 16, Potassium 3.1, Sodium 136, GFR 43-50  12/21/2018 Creatinine 0.99, BUN 18, Potassium 4.3, Sodium 130, GFR 54->60 A complete set of results can be found in Results Review.            Recommendations:  Left message with husband regarding call back.  Follow-up plan: ICM clinic phone appointment on 03/04/2019.   Office appt 03/05/2019 with Dr. Aundra Dubin.    Copy of ICM check sent to Dr. Lovena Le.   3 month ICM trend: 01/28/2019    1 Year ICM trend:       Rosalene Billings, RN 01/28/2019 3:36 PM

## 2019-01-28 NOTE — Telephone Encounter (Signed)
Remote ICM transmission received.  Attempted call to patient regarding ICM remote transmission and husband stated she was not at home.

## 2019-01-29 ENCOUNTER — Ambulatory Visit (INDEPENDENT_AMBULATORY_CARE_PROVIDER_SITE_OTHER): Payer: Medicare Other | Admitting: *Deleted

## 2019-01-29 ENCOUNTER — Other Ambulatory Visit (HOSPITAL_COMMUNITY): Payer: Self-pay | Admitting: Cardiology

## 2019-01-29 DIAGNOSIS — I428 Other cardiomyopathies: Secondary | ICD-10-CM

## 2019-01-29 DIAGNOSIS — I480 Paroxysmal atrial fibrillation: Secondary | ICD-10-CM

## 2019-01-29 LAB — CUP PACEART REMOTE DEVICE CHECK
Battery Remaining Longevity: 29 mo
Battery Voltage: 2.94 V
Brady Statistic AP VP Percent: 67.41 %
Brady Statistic AP VS Percent: 1.06 %
Brady Statistic AS VP Percent: 30.66 %
Brady Statistic AS VS Percent: 0.87 %
Brady Statistic RA Percent Paced: 68.43 %
Brady Statistic RV Percent Paced: 86.59 %
Date Time Interrogation Session: 20201013152605
HighPow Impedance: 68 Ohm
Implantable Lead Implant Date: 20170328
Implantable Lead Implant Date: 20170328
Implantable Lead Implant Date: 20170328
Implantable Lead Location: 753858
Implantable Lead Location: 753859
Implantable Lead Location: 753860
Implantable Lead Model: 4598
Implantable Lead Model: 5076
Implantable Lead Model: 6935
Implantable Pulse Generator Implant Date: 20170328
Lead Channel Impedance Value: 304 Ohm
Lead Channel Impedance Value: 304 Ohm
Lead Channel Impedance Value: 342 Ohm
Lead Channel Impedance Value: 361 Ohm
Lead Channel Impedance Value: 399 Ohm
Lead Channel Impedance Value: 399 Ohm
Lead Channel Impedance Value: 456 Ohm
Lead Channel Impedance Value: 456 Ohm
Lead Channel Impedance Value: 532 Ohm
Lead Channel Impedance Value: 589 Ohm
Lead Channel Impedance Value: 665 Ohm
Lead Channel Impedance Value: 703 Ohm
Lead Channel Impedance Value: 703 Ohm
Lead Channel Pacing Threshold Amplitude: 0.875 V
Lead Channel Pacing Threshold Amplitude: 1 V
Lead Channel Pacing Threshold Amplitude: 1 V
Lead Channel Pacing Threshold Pulse Width: 0.4 ms
Lead Channel Pacing Threshold Pulse Width: 0.4 ms
Lead Channel Pacing Threshold Pulse Width: 0.4 ms
Lead Channel Sensing Intrinsic Amplitude: 1.25 mV
Lead Channel Sensing Intrinsic Amplitude: 1.25 mV
Lead Channel Sensing Intrinsic Amplitude: 9.625 mV
Lead Channel Sensing Intrinsic Amplitude: 9.625 mV
Lead Channel Setting Pacing Amplitude: 2 V
Lead Channel Setting Pacing Amplitude: 2.25 V
Lead Channel Setting Pacing Amplitude: 2.5 V
Lead Channel Setting Pacing Pulse Width: 0.4 ms
Lead Channel Setting Pacing Pulse Width: 0.4 ms
Lead Channel Setting Sensing Sensitivity: 0.3 mV

## 2019-01-29 NOTE — Progress Notes (Signed)
Spoke with patient.  She is doing well and denies any fluid symptoms.  Weight has been below 134 lbs and today's weight is 133 lbs.  She has not taken Furosemide in the last week since weight has consistently stayed below 134 lb guideline.  Reviewed fluid accumulation symptoms and advised to call if she experiences any symptoms.  Her only complaint is feeling tired but she has done well recovering from hip surgery.  No changes and encouraged to call if experiencing any fluid symptoms.

## 2019-01-30 DIAGNOSIS — I110 Hypertensive heart disease with heart failure: Secondary | ICD-10-CM | POA: Diagnosis not present

## 2019-01-30 DIAGNOSIS — R55 Syncope and collapse: Secondary | ICD-10-CM | POA: Diagnosis not present

## 2019-01-30 DIAGNOSIS — I5022 Chronic systolic (congestive) heart failure: Secondary | ICD-10-CM | POA: Diagnosis not present

## 2019-01-30 DIAGNOSIS — I2510 Atherosclerotic heart disease of native coronary artery without angina pectoris: Secondary | ICD-10-CM | POA: Diagnosis not present

## 2019-01-30 DIAGNOSIS — S72011D Unspecified intracapsular fracture of right femur, subsequent encounter for closed fracture with routine healing: Secondary | ICD-10-CM | POA: Diagnosis not present

## 2019-01-30 DIAGNOSIS — I429 Cardiomyopathy, unspecified: Secondary | ICD-10-CM | POA: Diagnosis not present

## 2019-01-31 ENCOUNTER — Telehealth: Payer: Self-pay | Admitting: Internal Medicine

## 2019-01-31 MED ORDER — CITALOPRAM HYDROBROMIDE 20 MG PO TABS: ORAL_TABLET | ORAL | 0 refills | Status: DC

## 2019-01-31 NOTE — Telephone Encounter (Signed)
appt scheduled

## 2019-01-31 NOTE — Telephone Encounter (Signed)
Ok with celexa 1 month

## 2019-01-31 NOTE — Telephone Encounter (Signed)
Patient has not been seen in over a year needs to make an appointment

## 2019-01-31 NOTE — Telephone Encounter (Signed)
°  Relation to pt: self  Call back number: [phone]  Pharmacy: [facility] [place], [place] [phone] (Phone) [phone] (Fax)       Reason for call:  Patient requesting citalopram (CELEXA) 20 MG tablet and zolpidem (AMBIEN) 5 MG tablet informed patient please allow 48 to 72 hour turn around time

## 2019-01-31 NOTE — Telephone Encounter (Signed)
Control database checked last refill: 01/08/2018 30 tabs LOV: 08/09/2017 NOV:01/31/2019

## 2019-02-01 ENCOUNTER — Ambulatory Visit (INDEPENDENT_AMBULATORY_CARE_PROVIDER_SITE_OTHER): Payer: Medicare Other | Admitting: Internal Medicine

## 2019-02-01 ENCOUNTER — Other Ambulatory Visit: Payer: Self-pay

## 2019-02-01 ENCOUNTER — Encounter: Payer: Self-pay | Admitting: Internal Medicine

## 2019-02-01 VITALS — BP 112/80 | HR 72 | Temp 98.2°F | Ht 65.0 in | Wt 135.0 lb

## 2019-02-01 DIAGNOSIS — S72001A Fracture of unspecified part of neck of right femur, initial encounter for closed fracture: Secondary | ICD-10-CM | POA: Diagnosis not present

## 2019-02-01 DIAGNOSIS — Z0000 Encounter for general adult medical examination without abnormal findings: Secondary | ICD-10-CM | POA: Diagnosis not present

## 2019-02-01 DIAGNOSIS — E2839 Other primary ovarian failure: Secondary | ICD-10-CM

## 2019-02-01 DIAGNOSIS — E041 Nontoxic single thyroid nodule: Secondary | ICD-10-CM

## 2019-02-01 DIAGNOSIS — S72031D Displaced midcervical fracture of right femur, subsequent encounter for closed fracture with routine healing: Secondary | ICD-10-CM | POA: Diagnosis not present

## 2019-02-01 DIAGNOSIS — I10 Essential (primary) hypertension: Secondary | ICD-10-CM

## 2019-02-01 MED ORDER — ZOLPIDEM TARTRATE 5 MG PO TABS: ORAL_TABLET | ORAL | 3 refills | Status: DC

## 2019-02-01 NOTE — Progress Notes (Signed)
Subjective:   Patient ID: Cynthia Frank, female    DOB: 08-27-1935, 83 y.o.   MRN: [id number]  HPI Here for medicare wellness, with new complaints. Please see A/P for status and treatment of chronic medical problems.   HPI #2: Here for follow up blood pressure (taking lasix and metoprolol and spironolactone, denies chest pains or headaches, denies side effects) and recent hip fracture (no prior bone density, no prior treatment, is willing to try prolia if needed) and thyroid (recently started on this due to high TSH, on amiodarone, recent values okay, taking synthroid 25 mcg daily, denies side effects but cannot tell any change with starting).   Diet: heart healthy Physical activity: sedentary Depression/mood screen: negative Hearing: intact to whispered voice Visual acuity: grossly normal, performs annual eye exam  ADLs: capable Fall risk: low Home safety: good Cognitive evaluation: intact to orientation, naming, recall and repetition EOL planning: adv directives discussed    Office Visit from 02/01/2019 in [place]  PHQ-2 Total Score  0        I have personally reviewed and have noted 1. The patient's medical and social history - reviewed today no changes 2. Their use of alcohol, tobacco or illicit drugs 3. Their current medications and supplements 4. The patient's functional ability including ADL's, fall risks, home safety risks and hearing or visual impairment. 5. Diet and physical activities 6. Evidence for depression or mood disorders 7. Care team reviewed and updated  Patient Care Team: [name], MD as PCP - General (Internal Medicine) Past Medical History:  Diagnosis Date  . AICD (automatic cardioverter/defibrillator) present   . Arthritis    "left ankle" (07/14/2015)  . Atrial fibrillation ([place])   . CHF (congestive heart failure) ([place])   . Colon cancer ([place]) 03/2006   T3, N0  . Colon polyps 12/07/2010  . Coronary  artery disease    nonobstructive with 30% D1  . GERD (gastroesophageal reflux disease)   . History of ovarian cancer 1985  . Hx: UTI (urinary tract infection)   . Hyperlipidemia   . Hypertension   . Internal hemorrhoid   . Nonischemic cardiomyopathy ([place])    last EF assessment 40% by MUGA  . Partial bowel obstruction ([place])   . PVC (premature ventricular contraction)    Past Surgical History:  Procedure Laterality Date  . ABDOMINAL HYSTERECTOMY  1979  . ANKLE CLOSED REDUCTION  10/16/2011   Procedure: CLOSED REDUCTION ANKLE;  Surgeon: Tobi Bastos, MD;  Location: [facility];  Service: Orthopedics;  Laterality: Left;  . COLECTOMY  2007   for colon cancer  . COLONOSCOPY N/A 03/11/2013   Procedure: COLONOSCOPY;  Surgeon: Ladene Artist, MD;  Location: [facility];  Service: Endoscopy;  Laterality: N/A;  . COLONOSCOPY WITH PROPOFOL N/A 04/21/2015   Procedure: COLONOSCOPY WITH PROPOFOL;  Surgeon: Ladene Artist, MD;  Location: [facility];  Service: Endoscopy;  Laterality: N/A;  . COLONOSCOPY WITH PROPOFOL N/A 02/05/2018   Procedure: COLONOSCOPY WITH PROPOFOL Hemostasis Clip;  Surgeon: Ladene Artist, MD;  Location: [facility];  Service: Endoscopy;  Laterality: N/A;  . EP IMPLANTABLE DEVICE N/A 07/14/2015   Procedure: BiV ICD Insertion CRT-D;  Surgeon: Evans Lance, MD;  Location: [facility];  Service: Cardiovascular;  Laterality: N/A;  . ESOPHAGOGASTRODUODENOSCOPY (EGD) WITH PROPOFOL N/A 04/21/2015   Procedure: ESOPHAGOGASTRODUODENOSCOPY (EGD) WITH PROPOFOL;  Surgeon: Ladene Artist, MD;  Location: [facility];  Service: Endoscopy;  Laterality: N/A;  .  FRACTURE SURGERY    . INSERTION OF ICD Left 07/14/2015  . LAPAROTOMY  1980   "adhesions"  . LAPAROTOMY  1989   laparotomy for takedown of intestinal obstruction secondary to adhesions   . OOPHORECTOMY  1961  . OVARY SURGERY Right 1985   resection of right ovarian cancer  . POLYPECTOMY  02/05/2018   Procedure: POLYPECTOMY;   Surgeon: Ladene Artist, MD;  Location: [facility];  Service: Endoscopy;;  . SMALL INTESTINE SURGERY  1985  . TONSILLECTOMY  1944  . TOTAL HIP ARTHROPLASTY Right 12/18/2018   Procedure: TOTAL HIP ARTHROPLASTY ANTERIOR APPROACH;  Surgeon: Rod Can, MD;  Location: [facility];  Service: Orthopedics;  Laterality: Right;   Family History  Problem Relation Age of Onset  . Uterine cancer Mother   . Prostate cancer Father   . Heart disease Father   . Heart attack Father   . Colon cancer Neg Hx     Review of Systems  Constitutional: Negative.   HENT: Negative.   Eyes: Negative.   Respiratory: Negative for cough, chest tightness and shortness of breath.   Cardiovascular: Negative for chest pain, palpitations and leg swelling.  Gastrointestinal: Negative for abdominal distention, abdominal pain, constipation, diarrhea, nausea and vomiting.  Musculoskeletal: Negative.   Skin: Negative.   Neurological: Negative.   Psychiatric/Behavioral: Negative.     Objective:  Physical Exam Constitutional:      Appearance: She is well-developed.  HENT:     Head: Normocephalic and atraumatic.  Neck:     Musculoskeletal: Normal range of motion.  Cardiovascular:     Rate and Rhythm: Normal rate.  Pulmonary:     Effort: Pulmonary effort is normal. No respiratory distress.     Breath sounds: Normal breath sounds. No wheezing or rales.  Abdominal:     General: Bowel sounds are normal. There is no distension.     Palpations: Abdomen is soft.     Tenderness: There is no abdominal tenderness. There is no rebound.  Musculoskeletal:        General: Tenderness present.  Skin:    General: Skin is warm and dry.  Neurological:     Mental Status: She is alert and oriented to person, place, and time.     Coordination: Coordination normal.     Vitals:   02/01/19 1306  BP: 112/80  Pulse: 72  Temp: 98.2 F (36.8 C)  TempSrc: Oral  SpO2: 97%  Weight: 135 lb (61.2 kg)  Height: 5\' 5"  (1.651 m)     Assessment & Plan:

## 2019-02-01 NOTE — Patient Instructions (Signed)
We will get the bone density test done and think about getting the shingrix at the pharmacy.     Health Maintenance, Female Adopting a healthy lifestyle and getting preventive care are important in promoting health and wellness. Ask your health care provider about:  The right schedule for you to have regular tests and exams.  Things you can do on your own to prevent diseases and keep yourself healthy. What should I know about diet, weight, and exercise? Eat a healthy diet   Eat a diet that includes plenty of vegetables, fruits, low-fat dairy products, and lean protein.  Do not eat a lot of foods that are high in solid fats, added sugars, or sodium. Maintain a healthy weight Body mass index (BMI) is used to identify weight problems. It estimates body fat based on height and weight. Your health care provider can help determine your BMI and help you achieve or maintain a healthy weight. Get regular exercise Get regular exercise. This is one of the most important things you can do for your health. Most adults should:  Exercise for at least 150 minutes each week. The exercise should increase your heart rate and make you sweat (moderate-intensity exercise).  Do strengthening exercises at least twice a week. This is in addition to the moderate-intensity exercise.  Spend less time sitting. Even light physical activity can be beneficial. Watch cholesterol and blood lipids Have your blood tested for lipids and cholesterol at 81 years of age, then have this test every 5 years. Have your cholesterol levels checked more often if:  Your lipid or cholesterol levels are high.  You are older than 81 years of age.  You are at high risk for heart disease. What should I know about cancer screening? Depending on your health history and family history, you may need to have cancer screening at various ages. This may include screening for:  Breast cancer.  Cervical cancer.  Colorectal cancer.   Skin cancer.  Lung cancer. What should I know about heart disease, diabetes, and high blood pressure? Blood pressure and heart disease  High blood pressure causes heart disease and increases the risk of stroke. This is more likely to develop in people who have high blood pressure readings, are of African descent, or are overweight.  Have your blood pressure checked: ? Every 3-5 years if you are 21-6 years of age. ? Every year if you are 71 years old or older. Diabetes Have regular diabetes screenings. This checks your fasting blood sugar level. Have the screening done:  Once every three years after age 35 if you are at a normal weight and have a low risk for diabetes.  More often and at a younger age if you are overweight or have a high risk for diabetes. What should I know about preventing infection? Hepatitis B If you have a higher risk for hepatitis B, you should be screened for this virus. Talk with your health care provider to find out if you are at risk for hepatitis B infection. Hepatitis C Testing is recommended for:  Everyone born from [birth year] through 1963.  Anyone with known risk factors for hepatitis C. Sexually transmitted infections (STIs)  Get screened for STIs, including gonorrhea and chlamydia, if: ? You are sexually active and are younger than 81 years of age. ? You are older than 81 years of age and your health care provider tells you that you are at risk for this type of infection. ? Your sexual activity has changed  since you were last screened, and you are at increased risk for chlamydia or gonorrhea. Ask your health care provider if you are at risk.  Ask your health care provider about whether you are at high risk for HIV. Your health care provider may recommend a prescription medicine to help prevent HIV infection. If you choose to take medicine to prevent HIV, you should first get tested for HIV. You should then be tested every 3 months for as long as you are  taking the medicine. Pregnancy  If you are about to stop having your period (premenopausal) and you may become pregnant, seek counseling before you get pregnant.  Take 400 to 800 micrograms (mcg) of folic acid every day if you become pregnant.  Ask for birth control (contraception) if you want to prevent pregnancy. Osteoporosis and menopause Osteoporosis is a disease in which the bones lose minerals and strength with aging. This can result in bone fractures. If you are 61 years old or older, or if you are at risk for osteoporosis and fractures, ask your health care provider if you should:  Be screened for bone loss.  Take a calcium or vitamin D supplement to lower your risk of fractures.  Be given hormone replacement therapy (HRT) to treat symptoms of menopause. Follow these instructions at home: Lifestyle  Do not use any products that contain nicotine or tobacco, such as cigarettes, e-cigarettes, and chewing tobacco. If you need help quitting, ask your health care provider.  Do not use street drugs.  Do not share needles.  Ask your health care provider for help if you need support or information about quitting drugs. Alcohol use  Do not drink alcohol if: ? Your health care provider tells you not to drink. ? You are pregnant, may be pregnant, or are planning to become pregnant.  If you drink alcohol: ? Limit how much you use to 0-1 drink a day. ? Limit intake if you are breastfeeding.  Be aware of how much alcohol is in your drink. In the [place] one drink equals one 12 oz bottle of beer (355 mL), one 5 oz glass of wine (148 mL), or one 1 oz glass of hard liquor (44 mL). General instructions  Schedule regular health, dental, and eye exams.  Stay current with your vaccines.  Tell your health care provider if: ? You often feel depressed. ? You have ever been abused or do not feel safe at home. Summary  Adopting a healthy lifestyle and getting preventive care are important  in promoting health and wellness.  Follow your health care provider's instructions about healthy diet, exercising, and getting tested or screened for diseases.  Follow your health care provider's instructions on monitoring your cholesterol and blood pressure. This information is not intended to replace advice given to you by your health care provider. Make sure you discuss any questions you have with your health care provider. Document Released: 10/18/2010 Document Revised: 03/28/2018 Document Reviewed: 03/28/2018 Elsevier Patient Education  2020 [employer].

## 2019-02-01 NOTE — Assessment & Plan Note (Signed)
Taking lasix and metoprolol and spironolactone. Recent BMP at goal. Will check labs in the spring.

## 2019-02-01 NOTE — Assessment & Plan Note (Signed)
No bone density done after this fracture and needs done. Talked to her about prolia which we will plan for once bone density done.

## 2019-02-01 NOTE — Assessment & Plan Note (Signed)
With recent start of synthroid 25 mcg daily for mild increase in TSH. On concurrent amiodarone. Most recent monitoring with normal TSH and no indication for change in therapy.

## 2019-02-01 NOTE — Assessment & Plan Note (Signed)
Flu shot up to date for season. Pneumonia complete. Shingrix counseled. Tetanus due 2029. Colonoscopy aged out. Mammogram aged out, pap smear aged out and dexa ordered. Counseled about sun safety and mole surveillance. Counseled about the dangers of distracted driving. Given 10 year screening recommendations.

## 2019-02-04 ENCOUNTER — Telehealth: Payer: Self-pay

## 2019-02-04 NOTE — Telephone Encounter (Signed)
-----   Message from [name], MD sent at 02/01/2019  2:28 PM EDT ----- This patient needs to start on prolia for recent hip fracture, getting a dexa soon.   Thanks, [name]

## 2019-02-04 NOTE — Telephone Encounter (Signed)
Patient's insurance will be verfied for prolia injection, after we receive summary of benefits, I will contact patient to discuss

## 2019-02-06 ENCOUNTER — Ambulatory Visit (INDEPENDENT_AMBULATORY_CARE_PROVIDER_SITE_OTHER)
Admission: RE | Admit: 2019-02-06 | Discharge: 2019-02-06 | Disposition: A | Discharge status: Home / Self Care | Payer: Medicare Other | Source: Ambulatory Visit | Attending: Internal Medicine | Admitting: Internal Medicine

## 2019-02-06 ENCOUNTER — Other Ambulatory Visit: Payer: Self-pay

## 2019-02-06 DIAGNOSIS — R55 Syncope and collapse: Secondary | ICD-10-CM | POA: Diagnosis not present

## 2019-02-06 DIAGNOSIS — I110 Hypertensive heart disease with heart failure: Secondary | ICD-10-CM | POA: Diagnosis not present

## 2019-02-06 DIAGNOSIS — I429 Cardiomyopathy, unspecified: Secondary | ICD-10-CM | POA: Diagnosis not present

## 2019-02-06 DIAGNOSIS — S72011D Unspecified intracapsular fracture of right femur, subsequent encounter for closed fracture with routine healing: Secondary | ICD-10-CM | POA: Diagnosis not present

## 2019-02-06 DIAGNOSIS — I2510 Atherosclerotic heart disease of native coronary artery without angina pectoris: Secondary | ICD-10-CM | POA: Diagnosis not present

## 2019-02-06 DIAGNOSIS — E2839 Other primary ovarian failure: Secondary | ICD-10-CM

## 2019-02-06 DIAGNOSIS — I5022 Chronic systolic (congestive) heart failure: Secondary | ICD-10-CM | POA: Diagnosis not present

## 2019-02-06 NOTE — Telephone Encounter (Signed)
Patient has been submitted for insurance approval for Prolia injection through the Prolia Portal.  °Waiting on benefits. °Will contact patient once these have been received. °

## 2019-02-08 NOTE — Progress Notes (Signed)
Remote ICD transmission.   

## 2019-02-13 DIAGNOSIS — I5022 Chronic systolic (congestive) heart failure: Secondary | ICD-10-CM | POA: Diagnosis not present

## 2019-02-13 DIAGNOSIS — S72011D Unspecified intracapsular fracture of right femur, subsequent encounter for closed fracture with routine healing: Secondary | ICD-10-CM | POA: Diagnosis not present

## 2019-02-13 DIAGNOSIS — R55 Syncope and collapse: Secondary | ICD-10-CM | POA: Diagnosis not present

## 2019-02-13 DIAGNOSIS — I2510 Atherosclerotic heart disease of native coronary artery without angina pectoris: Secondary | ICD-10-CM | POA: Diagnosis not present

## 2019-02-13 DIAGNOSIS — I110 Hypertensive heart disease with heart failure: Secondary | ICD-10-CM | POA: Diagnosis not present

## 2019-02-13 DIAGNOSIS — I429 Cardiomyopathy, unspecified: Secondary | ICD-10-CM | POA: Diagnosis not present

## 2019-02-21 ENCOUNTER — Other Ambulatory Visit: Payer: Self-pay | Admitting: Internal Medicine

## 2019-02-26 ENCOUNTER — Other Ambulatory Visit (HOSPITAL_COMMUNITY): Payer: Medicare Other

## 2019-02-26 ENCOUNTER — Encounter: Payer: Medicare Other | Admitting: Student

## 2019-02-28 NOTE — Progress Notes (Signed)
    Attempted Electrophysiology CRT AV optimization   Date: 02/28/2019  ID:  Cynthia Frank, DOB 07-21-1935, MRN [id number]  PCP: [name], MD Primary Cardiologist: Dr. Aundra Dubin  Electrophysiologist: Dr. Lovena Le  CC: Heart failure despite LV lead placement  Medtronic CRT-D implanted 07/14/2015 for Chronic systolic CHF History of appropriate therapy: No History of AAD therapy: Yes, amiodarone  LV lead History: Model: 4398 Location: CS - distal lateral branch Threshold 1.0 V @ 0.40 ms Vector LV2 to LV3 Revisions/CXR: NA/ Most recent CXR post implant 06/2015 -> stable Diaphragmatic Stim: Not noted Prior optimization/changes N/A  Echocardiogram: Pre-CRT: 03/2015 LVEF 20-25% Post-CRT: 11/2015 LVEF 35% Most recent: 11/2018 :VEF 45-50%  EKG: Pre-device implant: 07/14/15 Sinus brady with 1st degree AV block 52 bpm, PR 214 ms, QRS 156 ms Post-device implant: Most recent 01/09/2019 shows A/V paced rhythm at 62 bpm with QRS of 140 ms  ICD interrogation: CRT pacing: 72.4% AF: 6.1%, possibly underestimated Thoracic impedence: Increased in setting of AF Activity Level: gradually increased post hip surgery See PaceArt report for full details.   EKG:  EKG is not ordered today. AF by device interrogation.   Assessment and Plan:  1.  Chronic systolic heart failure Patient reports worsening symptoms in the past several weeks, which correlate with increase in atrial fibrillation burden.  She is in atrial fibrillation by device interrogation today, and appears to have been for the past several days.  Unable to perform AV optimization in AF. Echo cancelled, as results would be skewed from AF, and had recent echo otherwise.   She has NOT missed any of her Eliquis or her amiodarone.  Discussed with Dr. Aundra Dubin (who had planned to see her 11/17) and will instead perform DCCV on Monday morning at 0830. Pre procedure labs today, as well as COVID testing.   Disposition:  FU with Dr.  Aundra Dubin post DCCV per Dr. Aundra Dubin. We will consider AV optimization at a later date.  She was doing well prior to her hip surgery, and it seems that AF may have been a driving factor in her symptoms.   Jacalyn Lefevre, PA-C  02/28/2019 1:19 PM  [address] [place] [address] [phone] (office) [phone] (fax

## 2019-03-01 ENCOUNTER — Encounter (HOSPITAL_COMMUNITY): Payer: Self-pay

## 2019-03-01 ENCOUNTER — Encounter: Payer: Self-pay | Admitting: *Deleted

## 2019-03-01 ENCOUNTER — Encounter (HOSPITAL_COMMUNITY): Payer: Self-pay | Admitting: Radiology

## 2019-03-01 ENCOUNTER — Other Ambulatory Visit (HOSPITAL_COMMUNITY)
Admission: RE | Admit: 2019-03-01 | Discharge: 2019-03-01 | Disposition: A | Discharge status: Home / Self Care | Payer: Medicare Other | Source: Ambulatory Visit | Attending: Cardiology | Admitting: Cardiology

## 2019-03-01 ENCOUNTER — Other Ambulatory Visit: Payer: Self-pay

## 2019-03-01 ENCOUNTER — Ambulatory Visit (INDEPENDENT_AMBULATORY_CARE_PROVIDER_SITE_OTHER): Payer: Medicare Other | Admitting: Student

## 2019-03-01 ENCOUNTER — Ambulatory Visit (HOSPITAL_COMMUNITY): Payer: Medicare Other

## 2019-03-01 DIAGNOSIS — Z01812 Encounter for preprocedural laboratory examination: Secondary | ICD-10-CM | POA: Insufficient documentation

## 2019-03-01 DIAGNOSIS — Z20828 Contact with and (suspected) exposure to other viral communicable diseases: Secondary | ICD-10-CM | POA: Insufficient documentation

## 2019-03-01 DIAGNOSIS — I428 Other cardiomyopathies: Secondary | ICD-10-CM | POA: Diagnosis not present

## 2019-03-01 LAB — BASIC METABOLIC PANEL
BUN/Creatinine Ratio: 15 (ref 12–28)
BUN: 17 mg/dL (ref 8–27)
CO2: 22 mmol/L (ref 20–29)
Calcium: 9.2 mg/dL (ref 8.7–10.3)
Chloride: 99 mmol/L (ref 96–106)
Creatinine, Ser: 1.15 mg/dL — ABNORMAL HIGH (ref 0.57–1.00)
GFR calc Af Amer: 52 mL/min/{1.73_m2} — ABNORMAL LOW (ref >59)
GFR calc non Af Amer: 45 mL/min/{1.73_m2} — ABNORMAL LOW (ref >59)
Glucose: 98 mg/dL (ref 65–99)
Potassium: 4 mmol/L (ref 3.5–5.2)
Sodium: 135 mmol/L (ref 134–144)

## 2019-03-01 LAB — CUP PACEART INCLINIC DEVICE CHECK
Battery Remaining Longevity: 28 mo
Battery Voltage: 2.94 V
Brady Statistic AP VP Percent: 47.64 %
Brady Statistic AP VS Percent: 0.74 %
Brady Statistic AS VP Percent: 44.74 %
Brady Statistic AS VS Percent: 6.89 %
Brady Statistic RA Percent Paced: 47.6 %
Brady Statistic RV Percent Paced: 75.51 %
Date Time Interrogation Session: 20201113132839
HighPow Impedance: 62 Ohm
Implantable Lead Implant Date: 20170328
Implantable Lead Implant Date: 20170328
Implantable Lead Implant Date: 20170328
Implantable Lead Location: 753858
Implantable Lead Location: 753859
Implantable Lead Location: 753860
Implantable Lead Model: 4598
Implantable Lead Model: 5076
Implantable Lead Model: 6935
Implantable Pulse Generator Implant Date: 20170328
Lead Channel Impedance Value: 304 Ohm
Lead Channel Impedance Value: 361 Ohm
Lead Channel Impedance Value: 399 Ohm
Lead Channel Impedance Value: 399 Ohm
Lead Channel Impedance Value: 399 Ohm
Lead Channel Impedance Value: 456 Ohm
Lead Channel Impedance Value: 475 Ohm
Lead Channel Impedance Value: 513 Ohm
Lead Channel Impedance Value: 608 Ohm
Lead Channel Impedance Value: 608 Ohm
Lead Channel Impedance Value: 722 Ohm
Lead Channel Impedance Value: 760 Ohm
Lead Channel Impedance Value: 779 Ohm
Lead Channel Pacing Threshold Amplitude: 1 V
Lead Channel Pacing Threshold Amplitude: 1.125 V
Lead Channel Pacing Threshold Amplitude: 1.25 V
Lead Channel Pacing Threshold Pulse Width: 0.4 ms
Lead Channel Pacing Threshold Pulse Width: 0.4 ms
Lead Channel Pacing Threshold Pulse Width: 0.4 ms
Lead Channel Sensing Intrinsic Amplitude: 0.875 mV
Lead Channel Sensing Intrinsic Amplitude: 0.875 mV
Lead Channel Sensing Intrinsic Amplitude: 10.5 mV
Lead Channel Sensing Intrinsic Amplitude: 9.125 mV
Lead Channel Setting Pacing Amplitude: 2.25 V
Lead Channel Setting Pacing Amplitude: 2.5 V
Lead Channel Setting Pacing Amplitude: 2.5 V
Lead Channel Setting Pacing Pulse Width: 0.4 ms
Lead Channel Setting Pacing Pulse Width: 0.4 ms
Lead Channel Setting Sensing Sensitivity: 0.3 mV

## 2019-03-01 LAB — CBC
Hematocrit: 39.6 % (ref 34.0–46.6)
Hemoglobin: 13 g/dL (ref 11.1–15.9)
MCH: 30 pg (ref 26.6–33.0)
MCHC: 32.8 g/dL (ref 31.5–35.7)
MCV: 92 fL (ref 79–97)
Platelets: 141 10*3/uL — ABNORMAL LOW (ref 150–450)
RBC: 4.33 x10E6/uL (ref 3.77–5.28)
RDW: 13.1 % (ref 11.7–15.4)
WBC: 6.1 10*3/uL (ref 3.4–10.8)

## 2019-03-01 NOTE — Progress Notes (Signed)
Patient ID: Cynthia Frank, female   DOB: [birth year], 83 y.o.   MRN: [id number] AV Opt Echo cancelled per PA Oda Kilts

## 2019-03-01 NOTE — Patient Instructions (Signed)
Medication Instructions:   Your physician recommends that you continue on your current medications as directed. Please refer to the Current Medication list given to you today.  *If you need a refill on your cardiac medications before your next appointment, please call your pharmacy*  Lab Work:  CBC AND BMET   If you have labs (blood work) drawn today and your tests are completely normal, you will receive your results only by: Marland Kitchen MyChart Message (if you have MyChart) OR . A paper copy in the mail If you have any lab test that is abnormal or we need to change your treatment, we will call you to review the results.  Testing/Procedures:  SEE LETTER FOR CARDIOVERSION Your physician has recommended that you have a Cardioversion (DCCV). Electrical Cardioversion uses a jolt of electricity to your heart either through paddles or wired patches attached to your chest. This is a controlled, usually prescheduled, procedure. Defibrillation is done under light anesthesia in the hospital, and you usually go home the day of the procedure. This is done to get your heart back into a normal rhythm. You are not awake for the procedure. Please see the instruction sheet given to you today.   Follow-Up: At [facility], you and your health needs are our priority.  As part of our continuing mission to provide you with exceptional heart care, we have created designated Provider Care Teams.  These Care Teams include your primary Cardiologist (physician) and Advanced Practice Providers (APPs -  Physician Assistants and Nurse Practitioners) who all work together to provide you with the care you need, when you need it.  Your next appointment:  AS SCHEDULED WITH DR Sherrilyn Rist BE CONTACTED BACK FOR FURTHER INSTRUCTIONS

## 2019-03-02 LAB — SARS CORONAVIRUS 2 (TAT 6-24 HRS): SARS Coronavirus 2: NEGATIVE

## 2019-03-03 ENCOUNTER — Encounter (HOSPITAL_COMMUNITY): Payer: Self-pay | Admitting: Anesthesiology

## 2019-03-03 NOTE — Anesthesia Preprocedure Evaluation (Deleted)
Anesthesia Evaluation    Reviewed: Allergy & Precautions, Patient's Chart, lab work & pertinent test results, reviewed documented beta blocker date and time   History of Anesthesia Complications Negative for: history of anesthetic complications  Airway        Dental   Pulmonary former smoker,           Cardiovascular hypertension, Pt. on medications and Pt. on home beta blockers + CAD and +CHF  + dysrhythmias Atrial Fibrillation + Cardiac Defibrillator   TTE 11/2018: EF 45-50%, RVSP moderately elevated, mild LAE, mild-moderate TVR, V-paced     Neuro/Psych negative neurological ROS  negative psych ROS   GI/Hepatic Neg liver ROS, GERD  Controlled,  Endo/Other  negative endocrine ROS  Renal/GU negative Renal ROS  negative genitourinary   Musculoskeletal  (+) Arthritis ,   Abdominal   Peds  Hematology negative hematology ROS (+)   Anesthesia Other Findings Day of surgery medications reviewed with patient.  Reproductive/Obstetrics negative OB ROS                             Anesthesia Physical Anesthesia Plan  ASA: III  Anesthesia Plan: General   Post-op Pain Management:    Induction: Intravenous  PONV Risk Score and Plan: Treatment may vary due to age or medical condition and Propofol infusion  Airway Management Planned: Mask  Additional Equipment: None  Intra-op Plan:   Post-operative Plan:   Informed Consent:   Plan Discussed with:   Anesthesia Plan Comments:         Anesthesia Quick Evaluation

## 2019-03-04 ENCOUNTER — Ambulatory Visit (INDEPENDENT_AMBULATORY_CARE_PROVIDER_SITE_OTHER): Payer: Medicare Other

## 2019-03-04 ENCOUNTER — Encounter (HOSPITAL_COMMUNITY): Payer: Self-pay | Admitting: *Deleted

## 2019-03-04 ENCOUNTER — Encounter (HOSPITAL_COMMUNITY)
Admission: RE | Disposition: A | Discharge status: Home / Self Care | Payer: Self-pay | Source: Home / Self Care | Attending: Cardiology

## 2019-03-04 ENCOUNTER — Ambulatory Visit (HOSPITAL_COMMUNITY)
Admission: RE | Admit: 2019-03-04 | Discharge: 2019-03-04 | Disposition: A | Discharge status: Home / Self Care | Payer: Medicare Other | Attending: Cardiology | Admitting: Cardiology

## 2019-03-04 ENCOUNTER — Other Ambulatory Visit: Payer: Self-pay

## 2019-03-04 DIAGNOSIS — Z538 Procedure and treatment not carried out for other reasons: Secondary | ICD-10-CM | POA: Insufficient documentation

## 2019-03-04 DIAGNOSIS — I5022 Chronic systolic (congestive) heart failure: Secondary | ICD-10-CM | POA: Diagnosis not present

## 2019-03-04 DIAGNOSIS — Z95810 Presence of automatic (implantable) cardiac defibrillator: Secondary | ICD-10-CM | POA: Diagnosis not present

## 2019-03-04 DIAGNOSIS — I4891 Unspecified atrial fibrillation: Secondary | ICD-10-CM | POA: Insufficient documentation

## 2019-03-04 SURGERY — CANCELLED PROCEDURE

## 2019-03-04 NOTE — Progress Notes (Signed)
Pt in normal rhythm verified by Medtr[employer] rep.  Dr. Aundra Dubin at bedside, pt discharged home.  Vista Lawman, RN

## 2019-03-05 ENCOUNTER — Other Ambulatory Visit (HOSPITAL_COMMUNITY): Payer: Medicare Other

## 2019-03-05 ENCOUNTER — Encounter (HOSPITAL_COMMUNITY): Payer: Medicare Other | Admitting: Cardiology

## 2019-03-06 ENCOUNTER — Telehealth: Payer: Self-pay

## 2019-03-06 NOTE — Telephone Encounter (Signed)
Remote ICM transmission received.  Attempted call to patient regarding ICM remote transmission and husband requested call back due to patient not available.

## 2019-03-06 NOTE — Progress Notes (Signed)
EPIC Encounter for ICM Monitoring  Patient Name: CAMRYN RISSE is a 83 y.o. female Date: 03/06/2019 Primary Care Physican: [name], MD Primary Cardiologist:[place] Electrophysiologist:Taylor Bi-V Pacing:88.5% 10/13/2020Weight: 134lbs   Since 03/01/2019 AT/AF                33 Time in AT/AF    2.2 hr/day (9.0%)  Longest AT/AF    69 minutes  Last monitored AT/AF episode per report was 03/02/2019  Attempted call to patient and unable to reach.   Transmission reviewed.   Opitvol thoracic impedancetrending close to baseline normal.  Prescribed: Furosemide20 mg take 2 tablets (40 mg total) every day. Hold if weight is 134 or less  Labs: 01/09/2019 Creatinine 1.26, BUN 17, Potassium 4.8, Sodium 133, GFR 40-46 01/07/2019 Creatinine 1.18, BUN 16, Potassium 3.1, Sodium 136, GFR 43-50  12/21/2018 Creatinine 0.99, BUN 18, Potassium 4.3, Sodium 130, GFR 54->60 A complete set of results can be found in Results Review.  Recommendations: Unable to reach.    Follow-up plan: ICM clinic phone appointment on 04/08/2019.   91 day device clinic remote transmission 04/30/2019.  Office appt 03/18/2019 with Dr. Aundra Dubin.    Copy of ICM check sent to Dr. Lovena Le and Dr Aundra Dubin.   AT/AF    3 month ICM trend: 03/04/2019    1 Year ICM trend:       Rosalene Billings, RN 03/06/2019 9:49 AM

## 2019-03-08 DIAGNOSIS — Z87891 Personal history of nicotine dependence: Secondary | ICD-10-CM | POA: Diagnosis not present

## 2019-03-08 DIAGNOSIS — J385 Laryngeal spasm: Secondary | ICD-10-CM | POA: Diagnosis not present

## 2019-03-08 DIAGNOSIS — J383 Other diseases of vocal cords: Secondary | ICD-10-CM | POA: Diagnosis not present

## 2019-03-18 ENCOUNTER — Encounter (HOSPITAL_COMMUNITY): Payer: Self-pay | Admitting: Cardiology

## 2019-03-18 ENCOUNTER — Ambulatory Visit (HOSPITAL_COMMUNITY)
Admission: RE | Admit: 2019-03-18 | Discharge: 2019-03-18 | Disposition: A | Discharge status: Home / Self Care | Payer: Medicare Other | Source: Ambulatory Visit | Attending: Cardiology | Admitting: Cardiology

## 2019-03-18 ENCOUNTER — Telehealth (HOSPITAL_COMMUNITY): Payer: Self-pay

## 2019-03-18 ENCOUNTER — Other Ambulatory Visit: Payer: Self-pay

## 2019-03-18 VITALS — BP 110/82 | HR 86 | Wt 135.4 lb

## 2019-03-18 DIAGNOSIS — R7989 Other specified abnormal findings of blood chemistry: Secondary | ICD-10-CM | POA: Diagnosis not present

## 2019-03-18 DIAGNOSIS — Z7901 Long term (current) use of anticoagulants: Secondary | ICD-10-CM | POA: Insufficient documentation

## 2019-03-18 DIAGNOSIS — Z85038 Personal history of other malignant neoplasm of large intestine: Secondary | ICD-10-CM | POA: Diagnosis not present

## 2019-03-18 DIAGNOSIS — Z8543 Personal history of malignant neoplasm of ovary: Secondary | ICD-10-CM | POA: Insufficient documentation

## 2019-03-18 DIAGNOSIS — Z87891 Personal history of nicotine dependence: Secondary | ICD-10-CM | POA: Diagnosis not present

## 2019-03-18 DIAGNOSIS — Z791 Long term (current) use of non-steroidal anti-inflammatories (NSAID): Secondary | ICD-10-CM | POA: Insufficient documentation

## 2019-03-18 DIAGNOSIS — Z79899 Other long term (current) drug therapy: Secondary | ICD-10-CM | POA: Insufficient documentation

## 2019-03-18 DIAGNOSIS — I428 Other cardiomyopathies: Secondary | ICD-10-CM | POA: Insufficient documentation

## 2019-03-18 DIAGNOSIS — Z881 Allergy status to other antibiotic agents status: Secondary | ICD-10-CM | POA: Diagnosis not present

## 2019-03-18 DIAGNOSIS — Z8249 Family history of ischemic heart disease and other diseases of the circulatory system: Secondary | ICD-10-CM | POA: Insufficient documentation

## 2019-03-18 DIAGNOSIS — I5022 Chronic systolic (congestive) heart failure: Secondary | ICD-10-CM | POA: Insufficient documentation

## 2019-03-18 DIAGNOSIS — I480 Paroxysmal atrial fibrillation: Secondary | ICD-10-CM | POA: Diagnosis not present

## 2019-03-18 DIAGNOSIS — I110 Hypertensive heart disease with heart failure: Secondary | ICD-10-CM | POA: Insufficient documentation

## 2019-03-18 DIAGNOSIS — Z79890 Hormone replacement therapy (postmenopausal): Secondary | ICD-10-CM | POA: Insufficient documentation

## 2019-03-18 DIAGNOSIS — E039 Hypothyroidism, unspecified: Secondary | ICD-10-CM | POA: Diagnosis not present

## 2019-03-18 DIAGNOSIS — Z882 Allergy status to sulfonamides status: Secondary | ICD-10-CM | POA: Insufficient documentation

## 2019-03-18 DIAGNOSIS — K219 Gastro-esophageal reflux disease without esophagitis: Secondary | ICD-10-CM | POA: Diagnosis not present

## 2019-03-18 LAB — COMPREHENSIVE METABOLIC PANEL
ALT: 50 U/L — ABNORMAL HIGH (ref 0–44)
AST: 51 U/L — ABNORMAL HIGH (ref 15–41)
Albumin: 3.3 g/dL — ABNORMAL LOW (ref 3.5–5.0)
Alkaline Phosphatase: 77 U/L (ref 38–126)
Anion gap: 11 (ref 5–15)
BUN: 17 mg/dL (ref 8–23)
CO2: 22 mmol/L (ref 22–32)
Calcium: 8.7 mg/dL — ABNORMAL LOW (ref 8.9–10.3)
Chloride: 101 mmol/L (ref 98–111)
Creatinine, Ser: 1.2 mg/dL — ABNORMAL HIGH (ref 0.44–1.00)
GFR calc Af Amer: 49 mL/min — ABNORMAL LOW (ref >60)
GFR calc non Af Amer: 42 mL/min — ABNORMAL LOW (ref >60)
Glucose, Bld: 103 mg/dL — ABNORMAL HIGH (ref 70–99)
Potassium: 4 mmol/L (ref 3.5–5.1)
Sodium: 134 mmol/L — ABNORMAL LOW (ref 135–145)
Total Bilirubin: 1 mg/dL (ref 0.3–1.2)
Total Protein: 6.5 g/dL (ref 6.5–8.1)

## 2019-03-18 LAB — TSH: TSH: 3.222 u[IU]/mL (ref 0.350–4.500)

## 2019-03-18 MED ORDER — FUROSEMIDE 40 MG PO TABS: 40.0000 mg | ORAL_TABLET | Freq: Every day | ORAL | 3 refills | Status: DC

## 2019-03-18 MED ORDER — AMIODARONE HCL 200 MG PO TABS: 200.0000 mg | ORAL_TABLET | Freq: Every day | ORAL | 3 refills | Status: DC

## 2019-03-18 MED ORDER — METOPROLOL SUCCINATE ER 25 MG PO TB24: 25.0000 mg | ORAL_TABLET | Freq: Every day | ORAL | 3 refills | Status: DC

## 2019-03-18 MED ORDER — POTASSIUM CHLORIDE CRYS ER 20 MEQ PO TBCR: 20.0000 meq | EXTENDED_RELEASE_TABLET | Freq: Every day | ORAL | 3 refills | Status: DC

## 2019-03-18 NOTE — Telephone Encounter (Signed)
Contacted PT via phone to advise that her ultrasound has been scheduled for 12/4 @ 9AM...needs to arrive by 8:45 @ [facility] Radiology Department  and be NPO after midnight.  PT understood directions given.

## 2019-03-18 NOTE — Progress Notes (Signed)
Date:  03/18/2019   ID:  [name], DOB 09/14/1935, MRN [id number]  Provider location: [facility] [place] Type of Visit: Established patient   PCP:  [name], MD  Cardiologist:  Dr. Aundra Dubin  Chief Complaint: Lightheadedness   History of Present Illness: Cynthia Frank is a 83 y.o. female who has a history of chronic systolic CHF/nonischemic cardiomyopathy, paroxysmal atrial fibrillation, and prior ovarian and colon cancers. She has had a cardiomyopathy known for > 15 years now. Cardiac cath at diagnosis showed mild nonobstructive disease.  EF has been persistently low.  Cause has been thought to be Adriamycin; she received this as part of her ovarian cancer treatment. She has had paroxysmal atrial fibrillation and has remained in NSR with amiodarone use.  No recent atrial fibrillation symptoms, typically feels prolonged palpitations thought to be atrial fibrillation 2-3 times/year.   She had had dyspnea with moderate to heavy exertion for 4-5 years prior to initial appt.  However, just before her initial appt, her symptoms had worsened.  She developed shortness of breath walking short distances on flat ground.  Lasix was increased to 40 mg bid and she lost weight and felt better.     Cardiac MRI was done in 1/17, showing EF 29% with normal RV.  Septal-lateral dyssynchrony was present and there was a non-coronary pattern of LGE in the septum.  LFTs were noted to be mildly elevated.  Amiodarone was decreased to 100 mg daily.   She had Medtronic CRT-D placed in 3/17. Repeat echo 8/17 showed EF 35% with diffuse hypokinesis, normal RV.  Echo in 4/19 showed EF 30-35%, mild AI.   On 11/12/17, she stood up, got lightheaded and passed out briefly with a fall.  Her ICD did not discharge.  She went to the ER and was found to have right C5 transverse process fracture.  She says that she has had lightheadedness with standing for years, comes and goes.  I  decreased her Toprol XL.   In 8/20, she had an episode of lightheadedness with standing followed by syncope and fall with right hip fracture.  She was admitted, had repair of right hip.  She was noted to be orthostatic and Entresto was stopped.  Echo in 8/20 showed EF 45-50% with normal RV systolic function.   She returns today for followup of CHF.  She has been more short of breath since her hip surgery.  She is short of breath walking up stairs or if she tries to walk fast.  She feels weak.  No orthopnea/PND.  No chest pain. On Thanksgiving, she had an episode where she would feel the room spinning, especially if she were to lie down.  This lasted on and off for about 2 days and has now resolved.  He has been taking Lasix only prn, prior to hip surgery she was taking Lasix 40 qam/20 qpm.   Medtronic device interrogation: Occasional brief atrial fibrillation (decreases BiV pacing percentage whenever she goes into atrial fibrillation), no VT, fluid index > threshold with down-trending thoracic impedance.   Labs (1/17): K 4.2, creatinine 1.14, BNP 855 Labs (3/17): SPEP negative, BNP 341, K 4.5, creatinine 1.27, AST 62, ALT 74, TSH normal with low free T3 and normal free T4, HCT 46.5 Labs (4/17): AST 64, ALT 67, BNP 278, K 3.6, creatinine 1.21 Labs (5/17): K 3.4, creatinine 1.39, AST 53, ALT 61, HBV/HCV negative, TSH mildly elevated, BNP 306 Labs (6/17): K 4.4, creatinine 1.36,  free T4 normal, free T3 mildly decreased, AST 50, ALT 53. Labs (7/17): ANA negative, ASMA positive Labs (8/17): AST 45, ALT 37, K 3.8, creatinine 1.19, TSH normal Labs (2/18): TSH normal, LDL 47, LFTs, normal Labs (3/18): K normal, creatinine 1.23.  Labs (7/18): K 4, creatinine 1.32, LFTs normal Labs (7/19): K 3.8, creatinine 1.25, hgb 11.6 Labs (8/19): TSH mildly elevated, K 4.2, creatinine 1.22 Labs (10/19): K 4.1, creatinine 1.23, LFTs normal, TSH mildly elevated with normal free T3 and free T4.  Labs (1/20): K 3.5,  creatinine 1.15, TSH elevated, free T3 and free T4 low, AST 63, ALT 51 Labs (5/20): K 3.8, creatinine 1.22, TSH normal, AST 49, ALT 43, tbili 0.9 Labs (11/20): K 4, creatinine 1.15  PMH: 1. Chronic systolic CHF: Nonischemic cardiomyopathy, thought to be related to adriamycin that she had for treatment of ovarian cancer.  NICM diagnosed ~ 2001.  - LHC ~ 2001 with 30% stenosis D1.   - Echo (2013) EF 25-30%.  - Echo (10/14) with EF 25-30%. - Echo (12/16) with EF 20-25%, mild LV dilation, restrictive diastolic function, moderate MR, severe LAE. - Cardiac MRI (1/17): Moderately dilated LV with EF 29%. Diffuse hypokinesis looking worse in the septal wall. Septal-lateral dyssynchrony. Normal RV size and systolic function.  Moderate MR with tethered posterior leaflet.  Mid-wall LGE in the basal to mid septum. This is not a coronary disease pattern. Could be suggestive of prior myocarditis. - Medtronic CRT-D placed 3/17.  - Echo (8/17): EF 35%, diffuse hypokinesis, normal RV size and systolic function, moderate MR.   - Echo (4/19): EF 30-35%, mild AI.  - Echo (8/20): EF 45-50%, diffuse hypokinesis, normal RV size and systolic function. 2. Atrial fibrillation: Paroxysmal.  3. HTN 4. Ovarian cancer: 1985, s/p surgery. Received Adriamycin-based chemotherapy.  5. Colon cancer: 2007, s/p surgery.  6. PVCs 7. GERD 8. PFTs (8/16) without significant abnormality. 9. Prior syncope 10. Elevated transaminases: Uncertain etiology.   Current Outpatient Medications  Medication Sig Dispense Refill  . amiodarone (PACERONE) 200 MG tablet Take 1 tablet (200 mg total) by mouth daily. 90 tablet 3  . apixaban (ELIQUIS) 5 MG TABS tablet Take 1 tablet (5 mg total) by mouth 2 (two) times daily. 60 tablet 10  . atorvastatin (LIPITOR) 40 MG tablet Take 1 tablet (40 mg total) by mouth daily at 6 PM. 90 tablet 3  . citalopram (CELEXA) 20 MG tablet Take 20 mg by mouth daily.    . Coenzyme Q10 (COQ10) 100 MG CAPS Take 100  mg by mouth daily.     Marland Kitchen conjugated estrogens (PREMARIN) vaginal cream Place 1 Applicatorful vaginally as needed (dyspareunia (Use as directed)). Reported on 05/11/2015    . diclofenac Sodium (VOLTAREN) 1 % GEL Apply 2 g topically 4 (four) times daily as needed.    . fluticasone (FLONASE) 50 MCG/ACT nasal spray Place 2 sprays into both nostrils 2 (two) times daily.     . furosemide (LASIX) 40 MG tablet Take 1 tablet (40 mg total) by mouth daily. 90 tablet 3  . levothyroxine (SYNTHROID) 25 MCG tablet Take 25 mcg by mouth daily before breakfast.    . Magnesium Oxide 500 MG CAPS Take 1,000 mg by mouth 2 (two) times daily.     . metoprolol succinate (TOPROL-XL) 25 MG 24 hr tablet Take 1 tablet (25 mg total) by mouth daily. 90 tablet 3  . spironolactone (ALDACTONE) 25 MG tablet Take 1 tablet (25 mg total) by mouth daily. 90 tablet 3  .  zolpidem (AMBIEN) 5 MG tablet Take 5 mg by mouth at bedtime as needed for sleep.    . potassium chloride SA (KLOR-CON) 20 MEQ tablet Take 1 tablet (20 mEq total) by mouth daily. 90 tablet 3   No current facility-administered medications for this encounter.     Allergies:   Clindamycin/lincomycin, Dramamine ii [meclizine hcl], Pneumococcal vaccines, and Sulfonamide derivatives   Social History:  The patient  reports that she quit smoking about 40 years ago. Her smoking use included cigarettes. She has a 10.00 pack-year smoking history. She has never used smokeless tobacco. She reports current alcohol use of about 14.0 standard drinks of alcohol per week. She reports that she does not use drugs.   Family History:  The patient's family history includes Heart attack in her father; Heart disease in her father; Prostate cancer in her father; Uterine cancer in her mother.   ROS:  Please see the history of present illness.   All other systems are personally reviewed and negative.   Exam:  BP 110/82   Pulse 86   Wt 61.4 kg (135 lb 6.4 oz)   SpO2 97%   BMI 22.53 kg/m   General: NAD Neck: JVP 8 cm, no thyromegaly or thyroid nodule.  Lungs: Clear to auscultation bilaterally with normal respiratory effort. CV: Nondisplaced PMI.  Heart regular S1/S2, no S3/S4, no murmur.  1+ ankle edema.  No carotid bruit.  Normal pedal pulses.  Abdomen: Soft, nontender, no hepatosplenomegaly, no distention.  Skin: Intact without lesions or rashes.  Neurologic: Alert and oriented x 3.  Psych: Normal affect. Extremities: No clubbing or cyanosis.  HEENT: Normal.   Recent Labs: 12/21/2018: Magnesium 2.2 03/01/2019: Hemoglobin 13.0; Platelets 141 03/18/2019: ALT 50; BUN 17; Creatinine, Ser 1.20; Potassium 4.0; Sodium 134; TSH 3.222  Personally reviewed   Wt Readings from Last 3 Encounters:  03/18/19 61.4 kg (135 lb 6.4 oz)  02/01/19 61.2 kg (135 lb)  01/09/19 62.9 kg (138 lb 9.6 oz)    ASSESSMENT AND PLAN:  1. Chronic systolic CHF: EF 0000000 with diffuse hypokinesis on 12/16 echo.  Nonischemic cardiomyopathy, probably related to Adriamycin with ovarian cancer treatment.  Cardiac MRI in 1/17 showed EF 29% with normal RV and septal-lateral dyssynchrony.  There was a non-cardiac pattern of LGE in the septum, cannot rule out prior myocarditis based on this pattern.  She has a Medtronic CRT-D device. Echo 4/19 with EF 30-35%, then echo in 8/20 showed EF up to 45-50%, significantly improved.  NYHA class III symptoms currently.  She is volume overloaded by exam and Optivol.  BP stable today, no further orthostatic symptoms.  - Increase Toprol XL to 25 mg daily.  - Continue spironolactone 25 daily. BMET today.   - She will stay off Entresto given syncope and lightheadedness while taking it.   - Restart Lasix 40 mg daily with KCl 20 daily.  BMET 10 days.  2. Atrial fibrillation: Paroxysmal. LFTs have continued to be very mildly elevated. I think that she needs to continue amiodarone as she loses BiV pacing when she goes into atrial fibrillation, few episodes primarily in 9/20 on device  interrogation today.  - Continue amiodarone but decrease to 200 mg daily.  Check LFTs and TSH today. She needs abdominal US due to elevated transaminases.  She will need a periodic eye exam as well.  - Continue warfarin.  3. Hypothyroidism: Suspect related to amiodarone use.   - Continue Levoxyl.  - TSH today as above.  Recommended follow-up:  6 wks.   Signed, [name], MD  03/18/2019  [place] [address] [facility] [place] [address] [phone] (office) [phone] (fax)

## 2019-03-18 NOTE — Telephone Encounter (Signed)
-----   Message from Larey Dresser, MD sent at 03/18/2019  1:10 PM EST ----- LFTs still elevated.  Decreased amiodarone today to 200 mg daily.  She needs a RUQ [place] to assess her liver, this was ordered before but never done.

## 2019-03-18 NOTE — Patient Instructions (Addendum)
Increase Metoprolol to 25 mg Daily  Decrease Amiodarone to 200 mg Daily  Take Furosemide 40 mg Daily  Start Potassium 20 meq Daily  Labs done today, we will notify you for abnormal results  Labs in 10 days  Your physician recommends that you schedule a follow-up appointment in: 6 weeks  If you have any questions or concerns before your next appointment please send [place] a message through [place] [facility] or call our office at [phone].  At the [facility], you and your health needs are our priority. As part of our continuing mission to provide you with exceptional heart care, we have created designated Provider Care Teams. These Care Teams include your primary Cardiologist (physician) and Advanced Practice Providers (APPs- Physician Assistants and Nurse Practitioners) who all work together to provide you with the care you need, when you need it.   You may see any of the following providers on your designated Care Team at your next follow up: Marland Kitchen Dr Glori Bickers . Dr [name] . Darrick Grinder, NP . Lyda Jester, PA   Please be sure to bring in all your medications bottles to every appointment.

## 2019-03-18 NOTE — Telephone Encounter (Signed)
Sent to [place], to help patient get scheduled.

## 2019-03-22 ENCOUNTER — Other Ambulatory Visit: Payer: Self-pay

## 2019-03-22 ENCOUNTER — Ambulatory Visit (HOSPITAL_COMMUNITY)
Admission: RE | Admit: 2019-03-22 | Discharge: 2019-03-22 | Disposition: A | Discharge status: Home / Self Care | Payer: Medicare Other | Source: Ambulatory Visit | Attending: Cardiology | Admitting: Cardiology

## 2019-03-22 DIAGNOSIS — R7989 Other specified abnormal findings of blood chemistry: Secondary | ICD-10-CM | POA: Diagnosis not present

## 2019-03-25 ENCOUNTER — Ambulatory Visit: Payer: Self-pay | Admitting: *Deleted

## 2019-03-25 NOTE — Telephone Encounter (Signed)
Patient reports she had vertigo Thanksgiving day- she is still having symptoms of being off balance. Patient saw cardi logist 11/30- she discussed vertigo symptoms and he referred back to PCP. Patient has no other symptoms- no fever, cough, respiratory symptoms. Patient advised would review with PCP for appointment- patient also states she is awaiting news on bone density injection. Call back # [phone]  Reason for Disposition . [1] MILD dizziness (e.g., walking normally) AND [2] has NOT been evaluated by physician for this  (Exception: dizziness caused by heat exposure, sudden standing, or poor fluid intake)  Answer Assessment - Initial Assessment Questions 1. DESCRIPTION: "Describe your dizziness."     Off balance 2. LIGHTHEADED: "Do you feel lightheaded?" (e.g., somewhat faint, woozy, weak upon standing)     Patient feels like she does not want to turn head quickly 3. VERTIGO: "Do you feel like either you or the room is spinning or tilting?" (i.e. vertigo)     no 4. SEVERITY: "How bad is it?"  "Do you feel like you are going to faint?" "Can you stand and walk?"   - MILD - walking normally   - MODERATE - interferes with normal activities (e.g., work, school)    - SEVERE - unable to stand, requires support to walk, feels like passing out now.      Mild/moderate 5. ONSET:  "When did the dizziness begin?"     11/26 6. AGGRAVATING FACTORS: "Does anything make it worse?" (e.g., standing, change in head position)     Feels off balance when turns head 7. HEART RATE: "Can you tell me your heart rate?" "How many beats in 15 seconds?"  (Note: not all patients can do this)       Patient has seen cardiologist 8. CAUSE: "What do you think is causing the dizziness?"    Not sure 9. RECURRENT SYMPTOM: "Have you had dizziness before?" If so, ask: "When was the last time?" "What happened that time?"     no 10. OTHER SYMPTOMS: "Do you have any other symptoms?" (e.g., fever, chest pain, vomiting,  diarrhea, bleeding)       Patient was sick when she had vertigo- thanksgiving was vertigo- spinning and vomited 11. PREGNANCY: "Is there any chance you are pregnant?" "When was your last menstrual period?"       n/a  Protocols used: DIZZINESS [facility]

## 2019-03-25 NOTE — Telephone Encounter (Signed)
Are you okay with in office visit since no symptoms?

## 2019-03-25 NOTE — Telephone Encounter (Signed)
If no symptoms okay with in office.

## 2019-03-25 NOTE — Telephone Encounter (Signed)
Should patient be a virtual visit or in office?

## 2019-03-25 NOTE — Telephone Encounter (Signed)
Appointment has been made for 12/9.

## 2019-03-27 ENCOUNTER — Other Ambulatory Visit: Payer: Self-pay

## 2019-03-27 ENCOUNTER — Ambulatory Visit (INDEPENDENT_AMBULATORY_CARE_PROVIDER_SITE_OTHER): Payer: Medicare Other | Admitting: Internal Medicine

## 2019-03-27 ENCOUNTER — Encounter: Payer: Self-pay | Admitting: Internal Medicine

## 2019-03-27 VITALS — BP 110/80 | HR 80 | Temp 97.6°F | Ht 65.0 in | Wt 137.0 lb

## 2019-03-27 DIAGNOSIS — R42 Dizziness and giddiness: Secondary | ICD-10-CM | POA: Diagnosis not present

## 2019-03-27 MED ORDER — MECLIZINE HCL 25 MG PO TABS: 25.0000 mg | ORAL_TABLET | Freq: Three times a day (TID) | ORAL | 0 refills | Status: DC | PRN

## 2019-03-27 NOTE — Patient Instructions (Signed)
We will try meclizine to use if needed.    Benign Positional Vertigo Vertigo is the feeling that you or your surroundings are moving when they are not. Benign positional vertigo is the most common form of vertigo. This is usually a harmless condition (benign). This condition is positional. This means that symptoms are triggered by certain movements and positions. This condition can be dangerous if it occurs while you are doing something that could cause harm to you or others. This includes activities such as driving or operating machinery. What are the causes? In many cases, the cause of this condition is not known. It may be caused by a disturbance in an area of the inner ear that helps your brain to sense movement and balance. This disturbance can be caused by:  Viral infection (labyrinthitis).  Head injury.  Repetitive motion, such as jumping, dancing, or running. What increases the risk? You are more likely to develop this condition if:  You are a woman.  You are 81 years of age or older. What are the signs or symptoms? Symptoms of this condition usually happen when you move your head or your eyes in different directions. Symptoms may start suddenly, and usually last for less than a minute. They include:  Loss of balance and falling.  Feeling like you are spinning or moving.  Feeling like your surroundings are spinning or moving.  Nausea and vomiting.  Blurred vision.  Dizziness.  Involuntary eye movement (nystagmus). Symptoms can be mild and cause only minor problems, or they can be severe and interfere with daily life. Episodes of benign positional vertigo may return (recur) over time. Symptoms may improve over time. How is this diagnosed? This condition may be diagnosed based on:  Your medical history.  Physical exam of the head, neck, and ears.  Tests, such as: ? MRI. ? CT scan. ? Eye movement tests. Your health care provider may ask you to change positions  quickly while he or she watches you for symptoms of benign positional vertigo, such as nystagmus. Eye movement may be tested with a variety of exams that are designed to evaluate or stimulate vertigo. ? An electroencephalogram (EEG). This records electrical activity in your brain. ? Hearing tests. You may be referred to a health care provider who specializes in ear, nose, and throat (ENT) problems (otolaryngologist) or a provider who specializes in disorders of the nervous system (neurologist). How is this treated?  This condition may be treated in a session in which your health care provider moves your head in specific positions to adjust your inner ear back to normal. Treatment for this condition may take several sessions. Surgery may be needed in severe cases, but this is rare. In some cases, benign positional vertigo may resolve on its own in 2-4 weeks. Follow these instructions at home: Safety  Move slowly. Avoid sudden body or head movements or certain positions, as told by your health care provider.  Avoid driving until your health care provider says it is safe for you to do so.  Avoid operating heavy machinery until your health care provider says it is safe for you to do so.  Avoid doing any tasks that would be dangerous to you or others if vertigo occurs.  If you have trouble walking or keeping your balance, try using a cane for stability. If you feel dizzy or unstable, sit down right away.  Return to your normal activities as told by your health care provider. Ask your health care provider  what activities are safe for you. General instructions  Take over-the-counter and prescription medicines only as told by your health care provider.  Drink enough fluid to keep your urine pale yellow.  Keep all follow-up visits as told by your health care provider. This is important. Contact a health care provider if:  You have a fever.  Your condition gets worse or you develop new  symptoms.  Your family or friends notice any behavioral changes.  You have nausea or vomiting that gets worse.  You have numbness or a "pins and needles" sensation. Get help right away if you:  Have difficulty speaking or moving.  Are always dizzy.  Faint.  Develop severe headaches.  Have weakness in your legs or arms.  Have changes in your hearing or vision.  Develop a stiff neck.  Develop sensitivity to light. Summary  Vertigo is the feeling that you or your surroundings are moving when they are not. Benign positional vertigo is the most common form of vertigo.  The cause of this condition is not known. It may be caused by a disturbance in an area of the inner ear that helps your brain to sense movement and balance.  Symptoms include loss of balance and falling, feeling that you or your surroundings are moving, nausea and vomiting, and blurred vision.  This condition can be diagnosed based on symptoms, physical exam, and other tests, such as MRI, CT scan, eye movement tests, and hearing tests.  Follow safety instructions as told by your health care provider. You will also be told when to contact your health care provider in case of problems. This information is not intended to replace advice given to you by your health care provider. Make sure you discuss any questions you have with your health care provider. Document Released: 01/10/2006 Document Revised: 09/13/2017 Document Reviewed: 09/13/2017 Elsevier Patient Education  2020 [employer].

## 2019-03-27 NOTE — Assessment & Plan Note (Signed)
Rx meclizine and given epley maneuvers to try. If no improvement may need CT head to rule out alternate cause. Does not sound consistent with stroke.

## 2019-03-27 NOTE — Progress Notes (Signed)
   Subjective:   Patient ID: Cynthia Frank, female    DOB: 1935-05-20, 83 y.o.   MRN: [id number]  HPI The patient is an 83 YO female coming in for dizziness. Started around thanksgiving Mar 14, 2019. Started suddenly with room spinning. Got nauseous but did not throw up. Denies triggers. Denies recent illness in the last month. Has been variable some days less severe and others is bad. Worse with turning head. Overall is slightly better today. Denies syncope or feeling lightheaded with this. Denies fevers or chills. Denies congestion or cough or SOB. Denies numbness or weakness or facial drooping or speech changes. Prior colon and ovarian cancer.   Review of Systems  Constitutional: Negative.   HENT: Negative.   Eyes: Negative.   Respiratory: Negative for cough, chest tightness and shortness of breath.   Cardiovascular: Negative for chest pain, palpitations and leg swelling.  Gastrointestinal: Negative for abdominal distention, abdominal pain, constipation, diarrhea, nausea and vomiting.  Musculoskeletal: Negative.   Skin: Negative.   Neurological: Positive for dizziness. Negative for tremors, speech difficulty, weakness and light-headedness.  Psychiatric/Behavioral: Negative.     Objective:  Physical Exam Constitutional:      Appearance: She is well-developed.  HENT:     Head: Normocephalic and atraumatic.     Right Ear: Tympanic membrane and ear canal normal. There is no impacted cerumen.     Left Ear: Tympanic membrane and ear canal normal. There is no impacted cerumen.  Neck:     Musculoskeletal: Normal range of motion.  Cardiovascular:     Rate and Rhythm: Normal rate and regular rhythm.  Pulmonary:     Effort: Pulmonary effort is normal. No respiratory distress.     Breath sounds: Normal breath sounds. No wheezing or rales.  Abdominal:     General: Bowel sounds are normal. There is no distension.     Palpations: Abdomen is soft.     Tenderness: There is no abdominal  tenderness. There is no rebound.  Skin:    General: Skin is warm and dry.  Neurological:     Mental Status: She is alert and oriented to person, place, and time.     Coordination: Coordination normal.     Vitals:   03/27/19 0836  BP: 110/80  Pulse: 80  Temp: 97.6 F (36.4 C)  TempSrc: Oral  SpO2: 95%  Weight: 137 lb (62.1 kg)  Height: 5\' 5"  (1.651 m)    This visit occurred during the SARS-CoV-2 public health emergency.  Safety protocols were in place, including screening questions prior to the visit, additional usage of staff PPE, and extensive cleaning of exam room while observing appropriate contact time as indicated for disinfecting solutions.   Assessment & Plan:

## 2019-03-28 ENCOUNTER — Ambulatory Visit (HOSPITAL_COMMUNITY)
Admission: RE | Admit: 2019-03-28 | Discharge: 2019-03-28 | Disposition: A | Discharge status: Home / Self Care | Payer: Medicare Other | Source: Ambulatory Visit | Attending: Cardiology | Admitting: Cardiology

## 2019-03-28 DIAGNOSIS — I5022 Chronic systolic (congestive) heart failure: Secondary | ICD-10-CM | POA: Insufficient documentation

## 2019-03-28 LAB — BASIC METABOLIC PANEL
Anion gap: 11 (ref 5–15)
BUN: 19 mg/dL (ref 8–23)
CO2: 23 mmol/L (ref 22–32)
Calcium: 9 mg/dL (ref 8.9–10.3)
Chloride: 101 mmol/L (ref 98–111)
Creatinine, Ser: 1.25 mg/dL — ABNORMAL HIGH (ref 0.44–1.00)
GFR calc Af Amer: 47 mL/min — ABNORMAL LOW (ref >60)
GFR calc non Af Amer: 40 mL/min — ABNORMAL LOW (ref >60)
Glucose, Bld: 114 mg/dL — ABNORMAL HIGH (ref 70–99)
Potassium: 4.1 mmol/L (ref 3.5–5.1)
Sodium: 135 mmol/L (ref 135–145)

## 2019-04-08 ENCOUNTER — Ambulatory Visit (INDEPENDENT_AMBULATORY_CARE_PROVIDER_SITE_OTHER): Payer: Medicare Other

## 2019-04-08 DIAGNOSIS — Z95810 Presence of automatic (implantable) cardiac defibrillator: Secondary | ICD-10-CM

## 2019-04-08 DIAGNOSIS — I5022 Chronic systolic (congestive) heart failure: Secondary | ICD-10-CM

## 2019-04-09 ENCOUNTER — Telehealth: Payer: Self-pay

## 2019-04-09 NOTE — Telephone Encounter (Signed)
Remote ICM transmission received.  Attempted call to patient regarding ICM remote transmission and no answer.  

## 2019-04-09 NOTE — Progress Notes (Signed)
EPIC Encounter for ICM Monitoring  Patient Name: Cynthia Frank is a 83 y.o. female Date: 04/09/2019 Primary Care Physican: [name], MD Primary Cardiologist:[place] Electrophysiologist:Taylor Bi-V Pacing:97.5% 11/30/2020Weight: 135lbs  Clinical Status (04-Mar-2019 to 08-Apr-2019)  Treated VT/VF  0 episodes    AT/AF                  85 episodes    Time in AT/AF  0.3 hr/day (1.2%)    Attempted call to patient and unable to reach.   Transmission reviewed.   Opitvol thoracic impedancenormal.  Prescribed: Furosemide20 mg take 2 tablets (40 mg total) every day.Hold if weight is 134 or less  Labs: 01/09/2019 Creatinine1.26, BUN17, Potassium4.8, Sodium133, GFR40-46 01/07/2019 Creatinine1.18, BUN16, Potassium3.1, Sodium136, GFR43-50  12/21/2018 Creatinine 0.99, BUN 18, Potassium 4.3, Sodium 130, GFR 54->60 A complete set of results can be found in Results Review.  Recommendations: Unable to reach.    Follow-up plan: ICM clinic phone appointment on 05/13/2019.   91 day device clinic remote transmission 04/30/2019.      Copy of ICM check sent to Dr. Lovena Le   3 month ICM trend: 04/09/2019    1 Year ICM trend:       Rosalene Billings, RN 04/09/2019 2:47 PM

## 2019-04-10 ENCOUNTER — Other Ambulatory Visit: Payer: Self-pay

## 2019-04-30 ENCOUNTER — Ambulatory Visit (INDEPENDENT_AMBULATORY_CARE_PROVIDER_SITE_OTHER): Payer: Medicare Other | Admitting: *Deleted

## 2019-04-30 DIAGNOSIS — I428 Other cardiomyopathies: Secondary | ICD-10-CM | POA: Diagnosis not present

## 2019-04-30 LAB — CUP PACEART REMOTE DEVICE CHECK
Battery Remaining Longevity: 24 mo
Battery Voltage: 2.94 V
Brady Statistic AP VP Percent: 86.97 %
Brady Statistic AP VS Percent: 2.47 %
Brady Statistic AS VP Percent: 7.76 %
Brady Statistic AS VS Percent: 2.8 %
Brady Statistic RA Percent Paced: 81.23 %
Brady Statistic RV Percent Paced: 93.9 %
Date Time Interrogation Session: 20210112032207
HighPow Impedance: 57 Ohm
Implantable Lead Implant Date: 20170328
Implantable Lead Implant Date: 20170328
Implantable Lead Implant Date: 20170328
Implantable Lead Location: 753858
Implantable Lead Location: 753859
Implantable Lead Location: 753860
Implantable Lead Model: 4598
Implantable Lead Model: 5076
Implantable Lead Model: 6935
Implantable Pulse Generator Implant Date: 20170328
Lead Channel Impedance Value: 285 Ohm
Lead Channel Impedance Value: 342 Ohm
Lead Channel Impedance Value: 361 Ohm
Lead Channel Impedance Value: 361 Ohm
Lead Channel Impedance Value: 399 Ohm
Lead Channel Impedance Value: 418 Ohm
Lead Channel Impedance Value: 456 Ohm
Lead Channel Impedance Value: 475 Ohm
Lead Channel Impedance Value: 589 Ohm
Lead Channel Impedance Value: 608 Ohm
Lead Channel Impedance Value: 722 Ohm
Lead Channel Impedance Value: 722 Ohm
Lead Channel Impedance Value: 760 Ohm
Lead Channel Pacing Threshold Amplitude: 0.875 V
Lead Channel Pacing Threshold Amplitude: 1 V
Lead Channel Pacing Threshold Amplitude: 1.375 V
Lead Channel Pacing Threshold Pulse Width: 0.4 ms
Lead Channel Pacing Threshold Pulse Width: 0.4 ms
Lead Channel Pacing Threshold Pulse Width: 0.4 ms
Lead Channel Sensing Intrinsic Amplitude: 0.75 mV
Lead Channel Sensing Intrinsic Amplitude: 0.75 mV
Lead Channel Sensing Intrinsic Amplitude: 7 mV
Lead Channel Sensing Intrinsic Amplitude: 7 mV
Lead Channel Setting Pacing Amplitude: 2.25 V
Lead Channel Setting Pacing Amplitude: 2.5 V
Lead Channel Setting Pacing Amplitude: 2.5 V
Lead Channel Setting Pacing Pulse Width: 0.4 ms
Lead Channel Setting Pacing Pulse Width: 0.4 ms
Lead Channel Setting Sensing Sensitivity: 0.3 mV

## 2019-05-09 ENCOUNTER — Encounter (HOSPITAL_COMMUNITY): Payer: Self-pay | Admitting: Cardiology

## 2019-05-09 ENCOUNTER — Ambulatory Visit (HOSPITAL_COMMUNITY)
Admission: RE | Admit: 2019-05-09 | Discharge: 2019-05-09 | Disposition: A | Discharge status: Home / Self Care | Payer: Medicare Other | Source: Ambulatory Visit | Attending: Cardiology | Admitting: Cardiology

## 2019-05-09 ENCOUNTER — Other Ambulatory Visit (HOSPITAL_COMMUNITY)
Admission: RE | Admit: 2019-05-09 | Discharge: 2019-05-09 | Disposition: A | Discharge status: Home / Self Care | Payer: Medicare Other | Source: Ambulatory Visit | Attending: Cardiology | Admitting: Cardiology

## 2019-05-09 ENCOUNTER — Other Ambulatory Visit: Payer: Self-pay

## 2019-05-09 ENCOUNTER — Other Ambulatory Visit (HOSPITAL_COMMUNITY): Payer: Self-pay

## 2019-05-09 VITALS — BP 109/50 | HR 75 | Wt 133.6 lb

## 2019-05-09 DIAGNOSIS — I471 Supraventricular tachycardia: Secondary | ICD-10-CM | POA: Diagnosis not present

## 2019-05-09 DIAGNOSIS — Z79899 Other long term (current) drug therapy: Secondary | ICD-10-CM | POA: Diagnosis not present

## 2019-05-09 DIAGNOSIS — Z7901 Long term (current) use of anticoagulants: Secondary | ICD-10-CM | POA: Diagnosis not present

## 2019-05-09 DIAGNOSIS — I5022 Chronic systolic (congestive) heart failure: Secondary | ICD-10-CM | POA: Diagnosis not present

## 2019-05-09 DIAGNOSIS — R946 Abnormal results of thyroid function studies: Secondary | ICD-10-CM | POA: Insufficient documentation

## 2019-05-09 DIAGNOSIS — I428 Other cardiomyopathies: Secondary | ICD-10-CM | POA: Insufficient documentation

## 2019-05-09 DIAGNOSIS — R531 Weakness: Secondary | ICD-10-CM | POA: Insufficient documentation

## 2019-05-09 DIAGNOSIS — E039 Hypothyroidism, unspecified: Secondary | ICD-10-CM | POA: Diagnosis not present

## 2019-05-09 DIAGNOSIS — I110 Hypertensive heart disease with heart failure: Secondary | ICD-10-CM | POA: Diagnosis not present

## 2019-05-09 DIAGNOSIS — Z20822 Contact with and (suspected) exposure to covid-19: Secondary | ICD-10-CM | POA: Insufficient documentation

## 2019-05-09 DIAGNOSIS — I517 Cardiomegaly: Secondary | ICD-10-CM | POA: Diagnosis not present

## 2019-05-09 DIAGNOSIS — F17210 Nicotine dependence, cigarettes, uncomplicated: Secondary | ICD-10-CM | POA: Diagnosis not present

## 2019-05-09 DIAGNOSIS — J9811 Atelectasis: Secondary | ICD-10-CM

## 2019-05-09 DIAGNOSIS — R42 Dizziness and giddiness: Secondary | ICD-10-CM | POA: Insufficient documentation

## 2019-05-09 DIAGNOSIS — Z8249 Family history of ischemic heart disease and other diseases of the circulatory system: Secondary | ICD-10-CM | POA: Diagnosis not present

## 2019-05-09 DIAGNOSIS — I480 Paroxysmal atrial fibrillation: Secondary | ICD-10-CM | POA: Diagnosis not present

## 2019-05-09 DIAGNOSIS — R0602 Shortness of breath: Secondary | ICD-10-CM | POA: Diagnosis not present

## 2019-05-09 DIAGNOSIS — Z01818 Encounter for other preprocedural examination: Secondary | ICD-10-CM | POA: Insufficient documentation

## 2019-05-09 DIAGNOSIS — I4891 Unspecified atrial fibrillation: Secondary | ICD-10-CM

## 2019-05-09 LAB — COMPREHENSIVE METABOLIC PANEL
ALT: 107 U/L — ABNORMAL HIGH (ref 0–44)
AST: 145 U/L — ABNORMAL HIGH (ref 15–41)
Albumin: 3.3 g/dL — ABNORMAL LOW (ref 3.5–5.0)
Alkaline Phosphatase: 93 U/L (ref 38–126)
Anion gap: 12 (ref 5–15)
BUN: 21 mg/dL (ref 8–23)
CO2: 21 mmol/L — ABNORMAL LOW (ref 22–32)
Calcium: 8.9 mg/dL (ref 8.9–10.3)
Chloride: 97 mmol/L — ABNORMAL LOW (ref 98–111)
Creatinine, Ser: 1.52 mg/dL — ABNORMAL HIGH (ref 0.44–1.00)
GFR calc Af Amer: 37 mL/min — ABNORMAL LOW (ref >60)
GFR calc non Af Amer: 32 mL/min — ABNORMAL LOW (ref >60)
Glucose, Bld: 141 mg/dL — ABNORMAL HIGH (ref 70–99)
Potassium: 4.1 mmol/L (ref 3.5–5.1)
Sodium: 130 mmol/L — ABNORMAL LOW (ref 135–145)
Total Bilirubin: 1.8 mg/dL — ABNORMAL HIGH (ref 0.3–1.2)
Total Protein: 6.3 g/dL — ABNORMAL LOW (ref 6.5–8.1)

## 2019-05-09 LAB — SARS CORONAVIRUS 2 (TAT 6-24 HRS): SARS Coronavirus 2: NEGATIVE

## 2019-05-09 LAB — BRAIN NATRIURETIC PEPTIDE: B Natriuretic Peptide: 2241.2 pg/mL — ABNORMAL HIGH (ref 0.0–100.0)

## 2019-05-09 MED ORDER — FUROSEMIDE 40 MG PO TABS: ORAL_TABLET | ORAL | 3 refills | Status: DC

## 2019-05-09 NOTE — Patient Instructions (Addendum)
TAKE Lasix 40mg  (1 tab) twice a day for 2 days, THEN Lasix 40mg  (1 tab) in the morning and 20mg  (1/2 tab) in the evening.     Labs and Chest Xray today We will only contact you if something comes back abnormal or we need to make some changes. Otherwise no news is good news!   REPEAT Lab work in 10 days    Your physician recommends that you schedule a follow-up appointment in: 44 month with Dr Aundra Dubin   Please call office at [phone] option [phone] if you have any questions or concerns.    Dear Cynthia Frank  You are scheduled for a TEE/Cardioversion/TEE Cardioversion on Monday, January 25th, 2021 with Dr. Aundra Dubin.  Please arrive at the [facility] (Main Entrance A) at [facility]: [address] [place], [place] [address] at 11:30 am.    DIET: Nothing to eat or drink after midnight except a sip of water with medications (see medication instructions below)  Medication Instructions: Hold Furosemide (Lasix) and Spironolactone  Continue your anticoagulant: Eliquis You will need to continue your anticoagulant after your procedure until you are told by your Provider that it is safe to stop   Labs: done today in office Covid screen You will need a pre procedure COVID test    WHEN:  Today before-3pm WHERE: [facility]  [place] [address]  This is a drive thru testing site, you will remain in your car. Be sure to get in the line FOR PROCEDURES Once you have been swabbed you will need to remain home in quarantine until you return for your procedure.   You must have a responsible person to drive you home and stay in the waiting area during your procedure. Failure to do so could result in cancellation.  Bring your insurance cards.  *Special Note: Every effort is made to have your procedure done on time. Occasionally there are emergencies that occur at the hospital that may cause delays. Please be patient if a delay does occur.

## 2019-05-09 NOTE — Progress Notes (Unsigned)
ReDS Vest / Clip - 05/09/19 1100      ReDS Vest / Clip   Station Marker  A  (Pended)     Ruler Value  27  (Pended)     ReDS Value Range  Low volume  (Pended)     ReDS Actual Value  31  (Pended)     Anatomical Comments  sitting

## 2019-05-09 NOTE — Progress Notes (Signed)
Date:  05/09/2019   ID:  Cynthia Frank, DOB 1935/08/15, MRN [id number]  Provider location: [facility] Type of Visit: Established patient   PCP:  [name], MD  Cardiologist:  Dr. Aundra Dubin  Chief Complaint: Lightheadedness   History of Present Illness: Cynthia Frank is a 84 y.o. female who has a history of chronic systolic CHF/nonischemic cardiomyopathy, paroxysmal atrial fibrillation, and prior ovarian and colon cancers. She has had a cardiomyopathy known for > 15 years now. Cardiac cath at diagnosis showed mild nonobstructive disease.  EF has been persistently low.  Cause has been thought to be Adriamycin; she received this as part of her ovarian cancer treatment. She has had paroxysmal atrial fibrillation and has remained in NSR with amiodarone use.  No recent atrial fibrillation symptoms, typically feels prolonged palpitations thought to be atrial fibrillation 2-3 times/year.   She had had dyspnea with moderate to heavy exertion for 4-5 years prior to initial appt.  However, just before her initial appt, her symptoms had worsened.  She developed shortness of breath walking short distances on flat ground.  Lasix was increased to 40 mg bid and she lost weight and felt better.     Cardiac MRI was done in 1/17, showing EF 29% with normal RV.  Septal-lateral dyssynchrony was present and there was a non-coronary pattern of LGE in the septum.  LFTs were noted to be mildly elevated.  Amiodarone was decreased to 100 mg daily.   She had Medtronic CRT-D placed in 3/17. Repeat echo 8/17 showed EF 35% with diffuse hypokinesis, normal RV.  Echo in 4/19 showed EF 30-35%, mild AI.   On 11/12/17, she stood up, got lightheaded and passed out briefly with a fall.  Her ICD did not discharge.  She went to the ER and was found to have right C5 transverse process fracture.  She says that she has had lightheadedness with standing for years, comes and goes.  I  decreased her Toprol XL.   In 8/20, she had an episode of lightheadedness with standing followed by syncope and fall with right hip fracture.  She was admitted, had repair of right hip.  She was noted to be orthostatic and Entresto was stopped.  Echo in 8/20 showed EF 45-50% with normal RV systolic function.   She returns today for followup of CHF.  Weight is stable.  She has been feeling poorly recently.  She does ok at rest but is weak and tired with exertion. She is short of breath bathing or walking up stairs.  She is short of breath when she tries to walk fast.  No orthopnea/PND. No cough or fever.  She still has "dizzy spells."  She describes these as a spinning sensation like vertigo.     Medtronic device interrogation: Patient is in atrial tachycardia and has been in it for several weeks.  Thoracic impedance has decreased with fluid index > threshold.   ECG (personally reviewed): Atrial tachycardia with BiV pacing   Labs (1/17): K 4.2, creatinine 1.14, BNP 855 Labs (3/17): SPEP negative, BNP 341, K 4.5, creatinine 1.27, AST 62, ALT 74, TSH normal with low free T3 and normal free T4, HCT 46.5 Labs (4/17): AST 64, ALT 67, BNP 278, K 3.6, creatinine 1.21 Labs (5/17): K 3.4, creatinine 1.39, AST 53, ALT 61, HBV/HCV negative, TSH mildly elevated, BNP 306 Labs (6/17): K 4.4, creatinine 1.36, free T4 normal, free T3 mildly decreased, AST 50, ALT 53. Labs (  7/17): ANA negative, ASMA positive Labs (8/17): AST 45, ALT 37, K 3.8, creatinine 1.19, TSH normal Labs (2/18): TSH normal, LDL 47, LFTs, normal Labs (3/18): K normal, creatinine 1.23.  Labs (7/18): K 4, creatinine 1.32, LFTs normal Labs (7/19): K 3.8, creatinine 1.25, hgb 11.6 Labs (8/19): TSH mildly elevated, K 4.2, creatinine 1.22 Labs (10/19): K 4.1, creatinine 1.23, LFTs normal, TSH mildly elevated with normal free T3 and free T4.  Labs (1/20): K 3.5, creatinine 1.15, TSH elevated, free T3 and free T4 low, AST 63, ALT 51 Labs (5/20):  K 3.8, creatinine 1.22, TSH normal, AST 49, ALT 43, tbili 0.9 Labs (11/20): K 4, creatinine 1.15  Labs (12/20): K 4.1, creatinine 1.25, TSH normal, AST 51, ALT 50  PMH: 1. Chronic systolic CHF: Nonischemic cardiomyopathy, thought to be related to adriamycin that she had for treatment of ovarian cancer.  NICM diagnosed ~ 2001.  - LHC ~ 2001 with 30% stenosis D1.   - Echo (2013) EF 25-30%.  - Echo (10/14) with EF 25-30%. - Echo (12/16) with EF 20-25%, mild LV dilation, restrictive diastolic function, moderate MR, severe LAE. - Cardiac MRI (1/17): Moderately dilated LV with EF 29%. Diffuse hypokinesis looking worse in the septal wall. Septal-lateral dyssynchrony. Normal RV size and systolic function.  Moderate MR with tethered posterior leaflet.  Mid-wall LGE in the basal to mid septum. This is not a coronary disease pattern. Could be suggestive of prior myocarditis. - Medtronic CRT-D placed 3/17.  - Echo (8/17): EF 35%, diffuse hypokinesis, normal RV size and systolic function, moderate MR.   - Echo (4/19): EF 30-35%, mild AI.  - Echo (8/20): EF 45-50%, diffuse hypokinesis, normal RV size and systolic function. 2. Atrial fibrillation: Paroxysmal.  3. HTN 4. Ovarian cancer: 1985, s/p surgery. Received Adriamycin-based chemotherapy.  5. Colon cancer: 2007, s/p surgery.  6. PVCs 7. GERD 8. PFTs (8/16) without significant abnormality. 9. Prior syncope 10. Elevated transaminases: Uncertain etiology.  - Abdominal US (12/20): Normal-appearing liver.   Current Outpatient Medications  Medication Sig Dispense Refill  . amiodarone (PACERONE) 200 MG tablet Take 1 tablet (200 mg total) by mouth daily. (Patient taking differently: Take 200 mg by mouth at bedtime. ) 90 tablet 3  . apixaban (ELIQUIS) 5 MG TABS tablet Take 1 tablet (5 mg total) by mouth 2 (two) times daily. 60 tablet 10  . atorvastatin (LIPITOR) 40 MG tablet Take 1 tablet (40 mg total) by mouth daily at 6 PM. 90 tablet 3  . citalopram  (CELEXA) 20 MG tablet Take 20 mg by mouth daily.    . Coenzyme Q10 (COQ10) 100 MG CAPS Take 100 mg by mouth daily.     Marland Kitchen conjugated estrogens (PREMARIN) vaginal cream Place 1 Applicatorful vaginally as needed (dyspareunia (Use as directed)). Reported on 05/11/2015    . diclofenac Sodium (VOLTAREN) 1 % GEL Apply 2 g topically 4 (four) times daily as needed (ankle pain.).     Marland Kitchen fluticasone (FLONASE) 50 MCG/ACT nasal spray Place 2 sprays into both nostrils 2 (two) times daily.     . furosemide (LASIX) 40 MG tablet Take 1 tablet (40 mg total) by mouth every morning AND 0.5 tablets (20 mg total) every evening. 45 tablet 3  . levothyroxine (SYNTHROID) 25 MCG tablet Take 25 mcg by mouth daily before breakfast.    . Magnesium Oxide 500 MG CAPS Take 1,000 mg by mouth 2 (two) times daily.     . meclizine (ANTIVERT) 25 MG tablet Take 1 tablet (  25 mg total) by mouth 3 (three) times daily as needed for dizziness. 30 tablet 0  . metoprolol succinate (TOPROL-XL) 25 MG 24 hr tablet Take 1 tablet (25 mg total) by mouth daily. 90 tablet 3  . potassium chloride SA (KLOR-CON) 20 MEQ tablet Take 1 tablet (20 mEq total) by mouth daily. 90 tablet 3  . spironolactone (ALDACTONE) 25 MG tablet Take 1 tablet (25 mg total) by mouth daily. 90 tablet 3  . zolpidem (AMBIEN) 5 MG tablet Take 5 mg by mouth at bedtime as needed for sleep.    Marland Kitchen acetaminophen (TYLENOL) 500 MG tablet Take 1,000 mg by mouth every 6 (six) hours as needed (for pain.).     No current facility-administered medications for this encounter.    Allergies:   Clindamycin/lincomycin, Dramamine ii [meclizine hcl], Pneumococcal vaccines, and Sulfonamide derivatives   Social History:  The patient  reports that she quit smoking about 41 years ago. Her smoking use included cigarettes. She has a 10.00 pack-year smoking history. She has never used smokeless tobacco. She reports current alcohol use of about 14.0 standard drinks of alcohol per week. She reports that she  does not use drugs.   Family History:  The patient's family history includes Heart attack in her father; Heart disease in her father; Prostate cancer in her father; Uterine cancer in her mother.   ROS:  Please see the history of present illness.   All other systems are personally reviewed and negative.   Exam:  BP (!) 109/50   Pulse 75   Wt 60.6 kg (133 lb 9.6 oz)   SpO2 98%   BMI 22.23 kg/m  General: NAD Neck: JVP 8-9 cm, no thyromegaly or thyroid nodule.  Lungs: Mild bibasilar crackles CV: Nondisplaced PMI.  Heart irregular S1/S2, no S3/S4, no murmur.  No peripheral edema.  No carotid bruit.  Normal pedal pulses.  Abdomen: Soft, nontender, no hepatosplenomegaly, no distention.  Skin: Intact without lesions or rashes.  Neurologic: Alert and oriented x 3.  Psych: Normal affect. Extremities: No clubbing or cyanosis.  HEENT: Normal.   Recent Labs: 12/21/2018: Magnesium 2.2 03/01/2019: Hemoglobin 13.0; Platelets 141 03/18/2019: TSH 3.222 05/09/2019: ALT 107; B Natriuretic Peptide 2,241.2; BUN 21; Creatinine, Ser 1.52; Potassium 4.1; Sodium 130  Personally reviewed   Wt Readings from Last 3 Encounters:  05/09/19 60.6 kg (133 lb 9.6 oz)  03/27/19 62.1 kg (137 lb)  03/18/19 61.4 kg (135 lb 6.4 oz)    ASSESSMENT AND PLAN:  1. Chronic systolic CHF: EF 0000000 with diffuse hypokinesis on 12/16 echo.  Nonischemic cardiomyopathy, probably related to Adriamycin with ovarian cancer treatment.  Cardiac MRI in 1/17 showed EF 29% with normal RV and septal-lateral dyssynchrony.  There was a non-cardiac pattern of LGE in the septum, cannot rule out prior myocarditis based on this pattern.  She has a Medtronic CRT-D device. Echo 4/19 with EF 30-35%, then echo in 8/20 showed EF up to 45-50%.  NYHA class III symptoms currently.  She is volume overloaded by exam and Optivol.  This may be due to loss of NSR (she is in atrial tachycardia by device interrogation).  - Continue Toprol XL 25 daily.   -  Continue spironolactone 25 daily. BMET today.   - She will stay off Entresto given syncope and lightheadedness while taking it.   - Increase Lasix to 40 mg bid x 3 days then 40 qam/20 qpm. BMET 10 days.   - CXR PA/lateral.  2. Atrial arrhythmias: Paroxysmal atrial  fibrillation. I think that she needs to continue amiodarone as she loses BiV pacing when she goes into atrial arrhythmias. Today, she is in atrial tachycardia and appears to have been in this rhythm for a number of weeks.  This may have triggered CHF exacerbation.  - Continue amiodarone 200 mg daily.  Check LFTs today. She will need a periodic eye exam as well.  - Continue apixaban - I will arrange for DCCV next week.  We discussed risks/benefits and she agrees to procedure.  3. Hypothyroidism: Suspect related to amiodarone use.   - Continue Levoxyl.  4. Elevated LFTs: Abdominal US showed unremarkable liver.  ?Related to amiodarone.  She is on amiodarone at 200 mg daily as she tolerates atrial arrhythmias poorly.   - Check LFTs today.  - Consider atrial fibrillation/atrial flutter ablation to enable her to stop amiodarone.   Recommended follow-up:  1 month  Signed, [name], MD  05/09/2019  [facility] [address] [facility] [place] [address] [phone] (office) [phone] (fax)

## 2019-05-10 ENCOUNTER — Other Ambulatory Visit (HOSPITAL_COMMUNITY): Payer: Self-pay

## 2019-05-10 ENCOUNTER — Telehealth (HOSPITAL_COMMUNITY): Payer: Self-pay

## 2019-05-10 NOTE — Telephone Encounter (Signed)
Called patient to clarify procedure. On paperwork from yesterdays visit it states patient will have TEE/Cardioversion.  Reiterated with patient that she will have only cardioversion.  She verbalized understanding that that was the case from original conversation yesterday. No further questions endorsed.

## 2019-05-13 ENCOUNTER — Encounter (HOSPITAL_COMMUNITY): Payer: Self-pay | Admitting: Anesthesiology

## 2019-05-13 ENCOUNTER — Ambulatory Visit (INDEPENDENT_AMBULATORY_CARE_PROVIDER_SITE_OTHER): Payer: Medicare Other

## 2019-05-13 ENCOUNTER — Encounter (HOSPITAL_COMMUNITY)
Admission: RE | Disposition: A | Discharge status: Home / Self Care | Payer: Self-pay | Source: Home / Self Care | Attending: Cardiology

## 2019-05-13 ENCOUNTER — Ambulatory Visit (HOSPITAL_COMMUNITY)
Admission: RE | Admit: 2019-05-13 | Discharge: 2019-05-13 | Disposition: A | Discharge status: Home / Self Care | Payer: Medicare Other | Attending: Cardiology | Admitting: Cardiology

## 2019-05-13 DIAGNOSIS — I5022 Chronic systolic (congestive) heart failure: Secondary | ICD-10-CM | POA: Diagnosis not present

## 2019-05-13 DIAGNOSIS — Z95810 Presence of automatic (implantable) cardiac defibrillator: Secondary | ICD-10-CM

## 2019-05-13 DIAGNOSIS — Z538 Procedure and treatment not carried out for other reasons: Secondary | ICD-10-CM | POA: Insufficient documentation

## 2019-05-13 DIAGNOSIS — I4891 Unspecified atrial fibrillation: Secondary | ICD-10-CM | POA: Insufficient documentation

## 2019-05-13 SURGERY — CANCELLED PROCEDURE

## 2019-05-13 NOTE — Progress Notes (Signed)
Pt self converted to NSR. HR:65. Medtronic Pacemaker interrogated and EKG obtained. Per Dr. Aundra Dubin pt is in NSR and will not need cardioversion.

## 2019-05-13 NOTE — Anesthesia Preprocedure Evaluation (Deleted)
Anesthesia Evaluation    Reviewed: Allergy & Precautions, Patient's Chart, lab work & pertinent test results  Airway        Dental   Pulmonary former smoker,           Cardiovascular hypertension, Pt. on home beta blockers + CAD and +CHF  + dysrhythmias Atrial Fibrillation + Cardiac Defibrillator   ECG: rate 60. Atrial-sensed ventricular-paced rhythm Biventricular pacemaker detected   Neuro/Psych negative neurological ROS     GI/Hepatic negative GI ROS, Neg liver ROS,   Endo/Other  Hypothyroidism   Renal/GU negative Renal ROS     Musculoskeletal negative musculoskeletal ROS (+)   Abdominal   Peds  Hematology HLD   Anesthesia Other Findings A-fib  Reproductive/Obstetrics                             Anesthesia Physical Anesthesia Plan  ASA: III  Anesthesia Plan: General   Post-op Pain Management:    Induction: Intravenous  PONV Risk Score and Plan: 3 and Propofol infusion and Treatment may vary due to age or medical condition  Airway Management Planned: Mask  Additional Equipment:   Intra-op Plan:   Post-operative Plan:   Informed Consent:   Plan Discussed with:   Anesthesia Plan Comments:         Anesthesia Quick Evaluation

## 2019-05-14 DIAGNOSIS — Z23 Encounter for immunization: Secondary | ICD-10-CM | POA: Diagnosis not present

## 2019-05-17 NOTE — Progress Notes (Signed)
EPIC Encounter for ICM Monitoring  Patient Name: Cynthia Frank is a 84 y.o. female Date: 05/17/2019 Primary Care Physican: [name], MD Primary Cardiologist:[place] Electrophysiologist:Taylor Bi-V Pacing:97.5% 1/29/2021Weight: 128lbs   Spoke with patient and reports feeling well at this time.  Denies fluid symptoms during decreased impedance  Opitvol thoracic impedancenormal.  Prescribed: Furosemide40 mg Take 1 tablet (40 mg total) by mouth every morning AND 0.5 tablets (20 mg total) every evening.  Labs: 05/09/2019 Creatinine 1.52, BUN 21, Potassium 4.1, Sodium 130, GFR 32-37 03/28/2019 Creatinine 1.25, BUN 19, Potassium 4.1, Sodium 135, GFR 40-47  03/18/2019 Creatinine 1.20, BUN 17, Potassium 4.0, Sodium 134, GFR 42-49  03/01/2019 Creatinine 1.15, BUN 17, Potassium 4.0, Sodium 135, GFR 45-52  01/09/2019 Creatinine1.26, BUN17, Potassium4.8, Sodium133, GFR40-46 01/07/2019 Creatinine1.18, BUN16, Potassium3.1, Sodium136, [id number]  12/21/2018 Creatinine 0.99, BUN 18, Potassium 4.3, Sodium 130, GFR 54->60 A complete set of results can be found in Results Review.  Recommendations: No changes and encouraged to call if experiencing any fluid symptoms.  Follow-up plan: ICM clinic phone appointment on 06/17/2019.   91 day device clinic remote transmission 07/30/2019.  Office appt 06/17/2019 with Dr. Aundra Dubin.    Copy of ICM check sent to Dr. Lovena Le.   3 month ICM trend: 05/13/2019    1 Year ICM trend:       Rosalene Billings, RN 05/17/2019 2:34 PM

## 2019-05-20 ENCOUNTER — Other Ambulatory Visit: Payer: Self-pay

## 2019-05-20 ENCOUNTER — Ambulatory Visit (HOSPITAL_COMMUNITY)
Admission: RE | Admit: 2019-05-20 | Discharge: 2019-05-20 | Disposition: A | Discharge status: Home / Self Care | Payer: Medicare Other | Source: Ambulatory Visit | Attending: Internal Medicine | Admitting: Internal Medicine

## 2019-05-20 DIAGNOSIS — I4891 Unspecified atrial fibrillation: Secondary | ICD-10-CM | POA: Diagnosis not present

## 2019-05-20 LAB — BASIC METABOLIC PANEL
Anion gap: 13 (ref 5–15)
BUN: 19 mg/dL (ref 8–23)
CO2: 24 mmol/L (ref 22–32)
Calcium: 8.9 mg/dL (ref 8.9–10.3)
Chloride: 97 mmol/L — ABNORMAL LOW (ref 98–111)
Creatinine, Ser: 1.38 mg/dL — ABNORMAL HIGH (ref 0.44–1.00)
GFR calc Af Amer: 41 mL/min — ABNORMAL LOW (ref >60)
GFR calc non Af Amer: 36 mL/min — ABNORMAL LOW (ref >60)
Glucose, Bld: 130 mg/dL — ABNORMAL HIGH (ref 70–99)
Potassium: 3.8 mmol/L (ref 3.5–5.1)
Sodium: 134 mmol/L — ABNORMAL LOW (ref 135–145)

## 2019-05-21 ENCOUNTER — Other Ambulatory Visit: Payer: Self-pay | Admitting: Internal Medicine

## 2019-05-27 ENCOUNTER — Ambulatory Visit: Payer: Medicare Other

## 2019-06-06 DIAGNOSIS — Z23 Encounter for immunization: Secondary | ICD-10-CM | POA: Diagnosis not present

## 2019-06-17 ENCOUNTER — Other Ambulatory Visit: Payer: Self-pay

## 2019-06-17 ENCOUNTER — Encounter (HOSPITAL_COMMUNITY): Payer: Self-pay | Admitting: Cardiology

## 2019-06-17 ENCOUNTER — Ambulatory Visit (HOSPITAL_COMMUNITY)
Admission: RE | Admit: 2019-06-17 | Discharge: 2019-06-17 | Disposition: A | Discharge status: Home / Self Care | Payer: Medicare Other | Source: Ambulatory Visit | Attending: Cardiology | Admitting: Cardiology

## 2019-06-17 ENCOUNTER — Ambulatory Visit (INDEPENDENT_AMBULATORY_CARE_PROVIDER_SITE_OTHER): Payer: Medicare Other

## 2019-06-17 VITALS — BP 100/70 | HR 64 | Wt 126.0 lb

## 2019-06-17 DIAGNOSIS — I110 Hypertensive heart disease with heart failure: Secondary | ICD-10-CM | POA: Diagnosis not present

## 2019-06-17 DIAGNOSIS — I471 Supraventricular tachycardia: Secondary | ICD-10-CM | POA: Insufficient documentation

## 2019-06-17 DIAGNOSIS — Z87891 Personal history of nicotine dependence: Secondary | ICD-10-CM | POA: Diagnosis not present

## 2019-06-17 DIAGNOSIS — Z8249 Family history of ischemic heart disease and other diseases of the circulatory system: Secondary | ICD-10-CM | POA: Insufficient documentation

## 2019-06-17 DIAGNOSIS — Z882 Allergy status to sulfonamides status: Secondary | ICD-10-CM | POA: Insufficient documentation

## 2019-06-17 DIAGNOSIS — Z79899 Other long term (current) drug therapy: Secondary | ICD-10-CM | POA: Diagnosis not present

## 2019-06-17 DIAGNOSIS — I428 Other cardiomyopathies: Secondary | ICD-10-CM | POA: Diagnosis not present

## 2019-06-17 DIAGNOSIS — Z8543 Personal history of malignant neoplasm of ovary: Secondary | ICD-10-CM | POA: Insufficient documentation

## 2019-06-17 DIAGNOSIS — Z95810 Presence of automatic (implantable) cardiac defibrillator: Secondary | ICD-10-CM | POA: Insufficient documentation

## 2019-06-17 DIAGNOSIS — I5022 Chronic systolic (congestive) heart failure: Secondary | ICD-10-CM

## 2019-06-17 DIAGNOSIS — K219 Gastro-esophageal reflux disease without esophagitis: Secondary | ICD-10-CM | POA: Diagnosis not present

## 2019-06-17 DIAGNOSIS — Z85038 Personal history of other malignant neoplasm of large intestine: Secondary | ICD-10-CM | POA: Diagnosis not present

## 2019-06-17 DIAGNOSIS — Z881 Allergy status to other antibiotic agents status: Secondary | ICD-10-CM | POA: Diagnosis not present

## 2019-06-17 DIAGNOSIS — I480 Paroxysmal atrial fibrillation: Secondary | ICD-10-CM | POA: Insufficient documentation

## 2019-06-17 DIAGNOSIS — E039 Hypothyroidism, unspecified: Secondary | ICD-10-CM | POA: Diagnosis not present

## 2019-06-17 DIAGNOSIS — Z7901 Long term (current) use of anticoagulants: Secondary | ICD-10-CM | POA: Insufficient documentation

## 2019-06-17 LAB — COMPREHENSIVE METABOLIC PANEL
ALT: 53 U/L — ABNORMAL HIGH (ref 0–44)
AST: 61 U/L — ABNORMAL HIGH (ref 15–41)
Albumin: 3.6 g/dL (ref 3.5–5.0)
Alkaline Phosphatase: 88 U/L (ref 38–126)
Anion gap: 11 (ref 5–15)
BUN: 23 mg/dL (ref 8–23)
CO2: 25 mmol/L (ref 22–32)
Calcium: 9 mg/dL (ref 8.9–10.3)
Chloride: 97 mmol/L — ABNORMAL LOW (ref 98–111)
Creatinine, Ser: 1.21 mg/dL — ABNORMAL HIGH (ref 0.44–1.00)
GFR calc Af Amer: 49 mL/min — ABNORMAL LOW (ref >60)
GFR calc non Af Amer: 42 mL/min — ABNORMAL LOW (ref >60)
Glucose, Bld: 95 mg/dL (ref 70–99)
Potassium: 4 mmol/L (ref 3.5–5.1)
Sodium: 133 mmol/L — ABNORMAL LOW (ref 135–145)
Total Bilirubin: 1.8 mg/dL — ABNORMAL HIGH (ref 0.3–1.2)
Total Protein: 6.9 g/dL (ref 6.5–8.1)

## 2019-06-17 MED ORDER — METOPROLOL SUCCINATE ER 25 MG PO TB24: 25.0000 mg | ORAL_TABLET | Freq: Every day | ORAL | 3 refills | Status: DC

## 2019-06-17 MED ORDER — SPIRONOLACTONE 25 MG PO TABS: 25.0000 mg | ORAL_TABLET | Freq: Every day | ORAL | 3 refills | Status: DC

## 2019-06-17 NOTE — Patient Instructions (Signed)
START taking Toprol XL and Spironolactone at night    DECREASE Lasix (Furosemide) to 40mg  in the morning and 20mg  in the evening    Labs today We will only contact you if something comes back abnormal or we need to make some changes. Otherwise no news is good news!   You have been referred to Electrophysiology with Dr Curt Bears.  You will get a call to schedule this appointment   Your physician recommends that you schedule a follow-up appointment in: 2 months with Dr Aundra Dubin   Please call office at [phone] option 2 if you have any questions or concerns.    At the [facility], you and your health needs are our priority. As part of our continuing mission to provide you with exceptional heart care, we have created designated Provider Care Teams. These Care Teams include your primary Cardiologist (physician) and Advanced Practice Providers (APPs- Physician Assistants and Nurse Practitioners) who all work together to provide you with the care you need, when you need it.   You may see any of the following providers on your designated Care Team at your next follow up: Marland Kitchen Dr Glori Bickers . Dr [name] . Darrick Grinder, NP . Lyda Jester, PA . Audry Riles, PharmD   Please be sure to bring in all your medications bottles to every appointment.

## 2019-06-17 NOTE — Progress Notes (Signed)
Date:  06/17/2019   ID:  Cynthia Frank, DOB [birth year], MRN [id number]  Provider location: [facility] Type of Visit: Established patient   PCP:  [name], MD  Cardiologist:  Dr. Aundra Dubin  Chief Complaint: Lightheadedness   History of Present Illness: Cynthia Frank is a 84 y.o. female who has a history of chronic systolic CHF/nonischemic cardiomyopathy, paroxysmal atrial fibrillation, and prior ovarian and colon cancers. She has had a cardiomyopathy known for > 15 years now. Cardiac cath at diagnosis showed mild nonobstructive disease.  EF has been persistently low.  Cause has been thought to be Adriamycin; she received this as part of her ovarian cancer treatment. She has had paroxysmal atrial fibrillation and has remained in NSR with amiodarone use.  No recent atrial fibrillation symptoms, typically feels prolonged palpitations thought to be atrial fibrillation 2-3 times/year.   She had had dyspnea with moderate to heavy exertion for 4-5 years prior to initial appt.  However, just before her initial appt, her symptoms had worsened.  She developed shortness of breath walking short distances on flat ground.  Lasix was increased to 40 mg bid and she lost weight and felt better.     Cardiac MRI was done in 1/17, showing EF 29% with normal RV.  Septal-lateral dyssynchrony was present and there was a non-coronary pattern of LGE in the septum.  LFTs were noted to be mildly elevated.  Amiodarone was decreased to 100 mg daily.   She had Medtronic CRT-D placed in 3/17. Repeat echo 8/17 showed EF 35% with diffuse hypokinesis, normal RV.  Echo in 4/19 showed EF 30-35%, mild AI.   On 11/12/17, she stood up, got lightheaded and passed out briefly with a fall.  Her ICD did not discharge.  She went to the ER and was found to have right C5 transverse process fracture.  She says that she has had lightheadedness with standing for years, comes and goes.  I  decreased her Toprol XL.   In 8/20, she had an episode of lightheadedness with standing followed by syncope and fall with right hip fracture.  She was admitted, had repair of right hip.  She was noted to be orthostatic and Entresto was stopped.  Echo in 8/20 showed EF 45-50% with normal RV systolic function.  At last appointment, patient was in atrial tachycardia and had been in it for several weeks.  She was volume overloaded.  Lasix was increased and she was brought into the hospital for DCCV, but she had converted to NSR on her own.    She returns today for followup of CHF.  She is not in an atrial arrhythmia today.  She feels weak in general and is short of breath walking up a hill or incline. Weight is down 7 lbs.  No palpitations.  No orthopnea/PND.  No chest pain.  She is currently taking Lasix 40 mg bid.   Medtronic device interrogation: Patient has been out of atrial fibrillation/atrial tachycardia recently.  Thoracic impedance is stable with fluid index < threshold.   ECG (personally reviewed): A-/BiV sequential pacing.   Labs (1/17): K 4.2, creatinine 1.14, BNP 855 Labs (3/17): SPEP negative, BNP 341, K 4.5, creatinine 1.27, AST 62, ALT 74, TSH normal with low free T3 and normal free T4, HCT 46.5 Labs (4/17): AST 64, ALT 67, BNP 278, K 3.6, creatinine 1.21 Labs (5/17): K 3.4, creatinine 1.39, AST 53, ALT 61, HBV/HCV negative, TSH mildly elevated, BNP 306  Labs (6/17): K 4.4, creatinine 1.36, free T4 normal, free T3 mildly decreased, AST 50, ALT 53. Labs (7/17): ANA negative, ASMA positive Labs (8/17): AST 45, ALT 37, K 3.8, creatinine 1.19, TSH normal Labs (2/18): TSH normal, LDL 47, LFTs, normal Labs (3/18): K normal, creatinine 1.23.  Labs (7/18): K 4, creatinine 1.32, LFTs normal Labs (7/19): K 3.8, creatinine 1.25, hgb 11.6 Labs (8/19): TSH mildly elevated, K 4.2, creatinine 1.22 Labs (10/19): K 4.1, creatinine 1.23, LFTs normal, TSH mildly elevated with normal free T3 and  free T4.  Labs (1/20): K 3.5, creatinine 1.15, TSH elevated, free T3 and free T4 low, AST 63, ALT 51 Labs (5/20): K 3.8, creatinine 1.22, TSH normal, AST 49, ALT 43, tbili 0.9 Labs (11/20): K 4, creatinine 1.15  Labs (12/20): K 4.1, creatinine 1.25, TSH normal, AST 51, ALT 50 Labs (1/21): AST 145, ALT 107, tbili 1.8, TSH normal Labs (2/21): K 3.8, creatinine 1.38  PMH: 1. Chronic systolic CHF: Nonischemic cardiomyopathy, thought to be related to adriamycin that she had for treatment of ovarian cancer.  NICM diagnosed ~ 2001.  - LHC ~ 2001 with 30% stenosis D1.   - Echo (2013) EF 25-30%.  - Echo (10/14) with EF 25-30%. - Echo (12/16) with EF 20-25%, mild LV dilation, restrictive diastolic function, moderate MR, severe LAE. - Cardiac MRI (1/17): Moderately dilated LV with EF 29%. Diffuse hypokinesis looking worse in the septal wall. Septal-lateral dyssynchrony. Normal RV size and systolic function.  Moderate MR with tethered posterior leaflet.  Mid-wall LGE in the basal to mid septum. This is not a coronary disease pattern. Could be suggestive of prior myocarditis. - Medtronic CRT-D placed 3/17.  - Echo (8/17): EF 35%, diffuse hypokinesis, normal RV size and systolic function, moderate MR.   - Echo (4/19): EF 30-35%, mild AI.  - Echo (8/20): EF 45-50%, diffuse hypokinesis, normal RV size and systolic function. 2. Atrial fibrillation/atrial tachycardia: Paroxysmal.  3. HTN 4. Ovarian cancer: 1985, s/p surgery. Received Adriamycin-based chemotherapy.  5. Colon cancer: 2007, s/p surgery.  6. PVCs 7. GERD 8. PFTs (8/16) without significant abnormality. 9. Prior syncope 10. Elevated transaminases: Uncertain etiology.  - Abdominal US (12/20): Normal-appearing liver.   Current Outpatient Medications  Medication Sig Dispense Refill  . acetaminophen (TYLENOL) 500 MG tablet Take 1,000 mg by mouth every 6 (six) hours as needed (for pain.).    Marland Kitchen amiodarone (PACERONE) 200 MG tablet Take 1 tablet  (200 mg total) by mouth daily. (Patient taking differently: Take 200 mg by mouth at bedtime. ) 90 tablet 3  . apixaban (ELIQUIS) 5 MG TABS tablet Take 1 tablet (5 mg total) by mouth 2 (two) times daily. 60 tablet 10  . atorvastatin (LIPITOR) 40 MG tablet Take 1 tablet (40 mg total) by mouth daily at 6 PM. 90 tablet 3  . citalopram (CELEXA) 20 MG tablet Take 20 mg by mouth daily.    . Coenzyme Q10 (COQ10) 100 MG CAPS Take 100 mg by mouth daily.     Marland Kitchen conjugated estrogens (PREMARIN) vaginal cream Place 1 Applicatorful vaginally as needed (dyspareunia (Use as directed)). Reported on 05/11/2015    . diclofenac Sodium (VOLTAREN) 1 % GEL Apply 2 g topically 4 (four) times daily as needed (ankle pain.).     Marland Kitchen fluticasone (FLONASE) 50 MCG/ACT nasal spray Place 2 sprays into both nostrils 2 (two) times daily.     . furosemide (LASIX) 40 MG tablet Take 1 tablet (40 mg total) by mouth every morning  AND 0.5 tablets (20 mg total) every evening. 45 tablet 3  . levothyroxine (SYNTHROID) 25 MCG tablet Take 25 mcg by mouth daily before breakfast.    . Magnesium Oxide 500 MG CAPS Take 1,000 mg by mouth 2 (two) times daily.     . meclizine (ANTIVERT) 25 MG tablet Take 1 tablet (25 mg total) by mouth 3 (three) times daily as needed for dizziness. 30 tablet 0  . metoprolol succinate (TOPROL-XL) 25 MG 24 hr tablet Take 1 tablet (25 mg total) by mouth at bedtime. 90 tablet 3  . potassium chloride SA (KLOR-CON) 20 MEQ tablet Take 1 tablet (20 mEq total) by mouth daily. 90 tablet 3  . spironolactone (ALDACTONE) 25 MG tablet Take 1 tablet (25 mg total) by mouth at bedtime. 90 tablet 3  . zolpidem (AMBIEN) 5 MG tablet Take 5 mg by mouth at bedtime as needed for sleep.     No current facility-administered medications for this encounter.    Allergies:   Clindamycin/lincomycin, Dramamine ii [meclizine hcl], Pneumococcal vaccines, and Sulfonamide derivatives   Social History:  The patient  reports that she quit smoking about  41 years ago. Her smoking use included cigarettes. She has a 10.00 pack-year smoking history. She has never used smokeless tobacco. She reports current alcohol use of about 14.0 standard drinks of alcohol per week. She reports that she does not use drugs.   Family History:  The patient's family history includes Heart attack in her father; Heart disease in her father; Prostate cancer in her father; Uterine cancer in her mother.   ROS:  Please see the history of present illness.   All other systems are personally reviewed and negative.   Exam:  BP 100/70   Pulse 64   Wt 57.2 kg (126 lb)   SpO2 97%   BMI 20.97 kg/m  General: NAD, thin Neck: No JVD, no thyromegaly or thyroid nodule.  Lungs: Clear to auscultation bilaterally with normal respiratory effort. CV: Nondisplaced PMI.  Heart regular S1/S2, no S3/S4, no murmur.  No peripheral edema.  No carotid bruit.  Normal pedal pulses.  Abdomen: Soft, nontender, no hepatosplenomegaly, no distention.  Skin: Intact without lesions or rashes.  Neurologic: Alert and oriented x 3.  Psych: Normal affect. Extremities: No clubbing or cyanosis.  HEENT: Normal.   Recent Labs: 12/21/2018: Magnesium 2.2 03/01/2019: Hemoglobin 13.0; Platelets 141 03/18/2019: TSH 3.222 05/09/2019: B Natriuretic Peptide 2,241.2 06/17/2019: ALT 53; BUN 23; Creatinine, Ser 1.21; Potassium 4.0; Sodium 133  Personally reviewed   Wt Readings from Last 3 Encounters:  06/17/19 57.2 kg (126 lb)  05/09/19 60.6 kg (133 lb 9.6 oz)  03/27/19 62.1 kg (137 lb)    ASSESSMENT AND PLAN:  1. Chronic systolic CHF: EF 0000000 with diffuse hypokinesis on 12/16 echo.  Nonischemic cardiomyopathy, probably related to Adriamycin with ovarian cancer treatment.  Cardiac MRI in 1/17 showed EF 29% with normal RV and septal-lateral dyssynchrony.  There was a non-cardiac pattern of LGE in the septum, cannot rule out prior myocarditis based on this pattern.  She has a Medtronic CRT-D device. Echo 4/19  with EF 30-35%, then echo in 8/20 showed EF up to 45-50%.  NYHA class II symptoms currently, she is not volume overloaded by exam or Optivol.  She is doing better back in NSR. BP remains soft.  - Continue Toprol XL 25 daily.   - Continue spironolactone 25 daily. BMET today.   - She will stay off Entresto given syncope and lightheadedness while  taking it.   - I think that she can decrease Lasix to 40 qam/20 qpm.  BMET today.  2. Atrial arrhythmias: Paroxysmal atrial fibrillation and atrial tachycardia. I think that she needs to continue amiodarone as she loses BiV pacing when she goes into atrial arrhythmias, triggering CHF exacerbation.   - Continue amiodarone 200 mg daily.  LFTs have been mildly elevated, recheck LFTs today.  She will need a periodic eye exam as well.  - Continue apixaban - I am going to have her evaluated by EP.  Given poor tolerance for atrial arrhythmias, I would like for ablation to be considered as this may allow [place] to stop amiodarone.  3. Hypothyroidism: Suspect related to amiodarone use.   - Continue Levoxyl, recent TSH stable.  4. Elevated LFTs: Abdominal US showed unremarkable liver.  ?Related to amiodarone.  She is on amiodarone at 200 mg daily as she tolerates atrial arrhythmias poorly.   - Check LFTs again today.  - Consider atrial fibrillation/atrial tachycardia ablation to enable her to stop amiodarone => referred to EP.   Recommended follow-up:  2 months  Signed, [name], MD  06/17/2019  [facility] [address] [facility] [place] [address] [phone] (office) [phone] (fax)

## 2019-06-19 ENCOUNTER — Ambulatory Visit (INDEPENDENT_AMBULATORY_CARE_PROVIDER_SITE_OTHER): Payer: Medicare Other | Admitting: *Deleted

## 2019-06-19 ENCOUNTER — Other Ambulatory Visit: Payer: Self-pay

## 2019-06-19 DIAGNOSIS — M810 Age-related osteoporosis without current pathological fracture: Secondary | ICD-10-CM

## 2019-06-19 MED ORDER — DENOSUMAB 60 MG/ML ~~LOC~~ SOSY
60.0000 mg | PREFILLED_SYRINGE | Freq: Once | SUBCUTANEOUS | Status: AC
  Administered 2019-06-19: 60 mg via SUBCUTANEOUS

## 2019-06-19 NOTE — Progress Notes (Signed)
Pls cosign for Prolia inj since PCP is out of the office today.Marland KitchenJohny Chess

## 2019-06-21 NOTE — Progress Notes (Signed)
EPIC Encounter for ICM Monitoring  Patient Name: Cynthia Frank is a 84 y.o. female Date: 06/21/2019 Primary Care Physican: [name], MD Primary Cardiologist:[place] Electrophysiologist:[place] Bi-V Pacing:98.2% 3/1/2021Weight: 126lbs   Spoke with patient and reports feeling well at this time.   Opitvol thoracic impedancenormal.  Prescribed: Furosemide40 mg Take 1 tablet (40 mg total) by mouth every morning AND 0.5 tablets (20 mg total) every evening.  Labs: 06/17/2019 Creatinine 1.21, BUN 23, Potassium 4.0, Sodium 133, GFR 42-49 05/20/2019 Creatinine 1.38, BUN 19, Potassium 3.8, Sodium 134, GFR 36-41  05/09/2019 Creatinine 1.52, BUN 21, Potassium 4.1, Sodium 130, GFR 32-37 03/28/2019 Creatinine 1.25, BUN 19, Potassium 4.1, Sodium 135, GFR 40-47  03/18/2019 Creatinine 1.20, BUN 17, Potassium 4.0, Sodium 134, GFR 42-49  03/01/2019 Creatinine 1.15, BUN 17, Potassium 4.0, Sodium 135, GFR 45-52  01/09/2019 Creatinine1.26, BUN17, Potassium4.8, Sodium133, GFR40-46 01/07/2019 Creatinine1.18, BUN16, Potassium3.1, Sodium136, GFR43-50  12/21/2018 Creatinine 0.99, BUN 18, Potassium 4.3, Sodium 130, GFR 54->60 A complete set of results can be found in Results Review.  Recommendations: No changes and encouraged to call if experiencing any fluid symptoms.  Follow-up plan: ICM clinic phone appointment on 07/22/2019.   91 day device clinic remote transmission 07/30/2019.  Office appt 07/01/2019 with Dr. Curt Bears for afib ablation consultation.    Copy of ICM check sent to Dr. Lovena Le.   3 month ICM trend: 06/17/2019    1 Year ICM trend:       Rosalene Billings, RN 06/21/2019 3:03 PM

## 2019-06-28 ENCOUNTER — Other Ambulatory Visit: Payer: Medicare Other

## 2019-06-28 ENCOUNTER — Ambulatory Visit (INDEPENDENT_AMBULATORY_CARE_PROVIDER_SITE_OTHER): Payer: Medicare Other | Admitting: Physician Assistant

## 2019-06-28 ENCOUNTER — Encounter: Payer: Self-pay | Admitting: Physician Assistant

## 2019-06-28 ENCOUNTER — Other Ambulatory Visit: Payer: Self-pay

## 2019-06-28 ENCOUNTER — Other Ambulatory Visit (INDEPENDENT_AMBULATORY_CARE_PROVIDER_SITE_OTHER): Payer: Medicare Other

## 2019-06-28 VITALS — BP 104/68 | HR 56 | Temp 97.8°F | Ht 65.0 in | Wt 127.6 lb

## 2019-06-28 DIAGNOSIS — R7989 Other specified abnormal findings of blood chemistry: Secondary | ICD-10-CM | POA: Diagnosis not present

## 2019-06-28 LAB — HEPATIC FUNCTION PANEL
ALT: 46 U/L — ABNORMAL HIGH (ref 0–35)
AST: 50 U/L — ABNORMAL HIGH (ref 0–37)
Albumin: 3.7 g/dL (ref 3.5–5.2)
Alkaline Phosphatase: 96 U/L (ref 39–117)
Bilirubin, Direct: 0.5 mg/dL — ABNORMAL HIGH (ref 0.0–0.3)
Total Bilirubin: 1.4 mg/dL — ABNORMAL HIGH (ref 0.2–1.2)
Total Protein: 7 g/dL (ref 6.0–8.3)

## 2019-06-28 LAB — IBC + FERRITIN
Ferritin: 88.9 ng/mL (ref 10.0–291.0)
Iron: 77 ug/dL (ref 42–145)
Saturation Ratios: 18.6 % — ABNORMAL LOW (ref 20.0–50.0)
Transferrin: 296 mg/dL (ref 212.0–360.0)

## 2019-06-28 LAB — PROTIME-INR
INR: 1.9 ratio — ABNORMAL HIGH (ref 0.8–1.0)
Prothrombin Time: 21 s — ABNORMAL HIGH (ref 9.6–13.1)

## 2019-06-28 NOTE — Progress Notes (Signed)
Reviewed and agree with management plan.  Argelio Granier T. Mollye Guinta, MD FACG [facility]  

## 2019-06-28 NOTE — Patient Instructions (Signed)
If you are age 82 or older, your body mass index should be between 23-30. Your Body mass index is 21.23 kg/m. If this is out of the aforementioned range listed, please consider follow up with your Primary Care Provider.  If you are age 102 or younger, your body mass index should be between 19-25. Your Body mass index is 21.23 kg/m. If this is out of the aformentioned range listed, please consider follow up with your Primary Care Provider.   Your provider has requested that you go to the basement level for lab work before leaving today. Press "B" on the elevator. The lab is located at the first door on the left as you exit the elevator.

## 2019-06-28 NOTE — Progress Notes (Signed)
Chief Complaint: Elevated LFTs  HPI:    Cynthia Frank is an 84 year old Caucasian female with a past medical history as listed below including colon cancer, A. fib on Coumadin, CHF (EF 12/16/2018 45-50%), status post defibrillator and multiple others, known to Dr. Fuller Plan, who presents clinic today with a complaint of elevated LFTs.    12/14/2017 patient seen in clinic by Dr. Fuller Plan.  It was noted that she had a history of colon cancer in 2007 status post right hemicolectomy with recurrent adenomatous colon polyps.  Most recent colonoscopy 04/2015 was 6 sessile polyps in the sigmoid colon and ascending colon.  Patient was scheduled for colonoscopy.    02/05/2018 colonoscopy with one 8 mm polyp in the transverse colon, one 14 mm polyp in the sigmoid colon, internal hemorrhoids and patent end-to-side ileocolonic anastomosis.  Repeat recommended in 2 years.    03/22/2019 right upper quadrant ultrasound with no acute findings.    06/17/2019 CMP with AST 61 (05/09/2019-145), ALT 53 (05/09/2019-107), total bilirubin 1.8 (05/09/2019-1.8).  (Per chart review it appears patient's AST is always been elevated as well as ALT ever since 05/02/2018).    Today, the patient tells me that her liver enzymes have been elevated for a long time. Apparently has already discussed this with her cardiologist who thinks it may be the Amiodarone but wanted her to come here and rule out other causes. They are also sending her to a cardiologist to discuss discuss ablative therapy for her A. fib in the future. Patient denies any symptoms.    Denies fever, chills, change in bowel habits, jaundice, pruritus, family history of liver disease, history of IV drug use, history of blood transfusions, history of tattoos, alcohol abuse or history of hepatitis.  Past Medical History:  Diagnosis Date  . AICD (automatic cardioverter/defibrillator) present   . Arthritis    "left ankle" (07/14/2015)  . Atrial fibrillation ([place])   . CHF (congestive heart  failure) ([place])   . Colon cancer ([place]) 03/2006   T3, N0  . Colon polyps 12/07/2010  . Coronary artery disease    nonobstructive with 30% D1  . GERD (gastroesophageal reflux disease)   . History of ovarian cancer 1985  . Hx: UTI (urinary tract infection)   . Hyperlipidemia   . Hypertension   . Internal hemorrhoid   . Nonischemic cardiomyopathy ([place])    last EF assessment 40% by MUGA  . Partial bowel obstruction ([place])   . PVC (premature ventricular contraction)     Past Surgical History:  Procedure Laterality Date  . ABDOMINAL HYSTERECTOMY  1979  . ANKLE CLOSED REDUCTION  10/16/2011   Procedure: CLOSED REDUCTION ANKLE;  Surgeon: Tobi Bastos, MD;  Location: [facility];  Service: Orthopedics;  Laterality: Left;  . COLECTOMY  2007   for colon cancer  . COLONOSCOPY N/A 03/11/2013   Procedure: COLONOSCOPY;  Surgeon: Ladene Artist, MD;  Location: [facility];  Service: Endoscopy;  Laterality: N/A;  . COLONOSCOPY WITH PROPOFOL N/A 04/21/2015   Procedure: COLONOSCOPY WITH PROPOFOL;  Surgeon: Ladene Artist, MD;  Location: [facility];  Service: Endoscopy;  Laterality: N/A;  . COLONOSCOPY WITH PROPOFOL N/A 02/05/2018   Procedure: COLONOSCOPY WITH PROPOFOL Hemostasis Clip;  Surgeon: Ladene Artist, MD;  Location: [facility];  Service: Endoscopy;  Laterality: N/A;  . EP IMPLANTABLE DEVICE N/A 07/14/2015   Procedure: BiV ICD Insertion CRT-D;  Surgeon: Evans Lance, MD;  Location: [facility];  Service: Cardiovascular;  Laterality: N/A;  .  ESOPHAGOGASTRODUODENOSCOPY (EGD) WITH PROPOFOL N/A 04/21/2015   Procedure: ESOPHAGOGASTRODUODENOSCOPY (EGD) WITH PROPOFOL;  Surgeon: Ladene Artist, MD;  Location: [facility];  Service: Endoscopy;  Laterality: N/A;  . FRACTURE SURGERY    . INSERTION OF ICD Left 07/14/2015  . LAPAROTOMY  1980   "adhesions"  . LAPAROTOMY  1989   laparotomy for takedown of intestinal obstruction secondary to adhesions   . OOPHORECTOMY  1961  . OVARY SURGERY  Right 1985   resection of right ovarian cancer  . POLYPECTOMY  02/05/2018   Procedure: POLYPECTOMY;  Surgeon: Ladene Artist, MD;  Location: [facility];  Service: Endoscopy;;  . SMALL INTESTINE SURGERY  1985  . TONSILLECTOMY  1944  . TOTAL HIP ARTHROPLASTY Right 12/18/2018   Procedure: TOTAL HIP ARTHROPLASTY ANTERIOR APPROACH;  Surgeon: Rod Can, MD;  Location: [facility];  Service: Orthopedics;  Laterality: Right;    Current Outpatient Medications  Medication Sig Dispense Refill  . acetaminophen (TYLENOL) 500 MG tablet Take 1,000 mg by mouth every 6 (six) hours as needed (for pain.).    Marland Kitchen amiodarone (PACERONE) 200 MG tablet Take 1 tablet (200 mg total) by mouth daily. (Patient taking differently: Take 200 mg by mouth at bedtime. ) 90 tablet 3  . apixaban (ELIQUIS) 5 MG TABS tablet Take 1 tablet (5 mg total) by mouth 2 (two) times daily. 60 tablet 10  . atorvastatin (LIPITOR) 40 MG tablet Take 1 tablet (40 mg total) by mouth daily at 6 PM. 90 tablet 3  . citalopram (CELEXA) 20 MG tablet Take 20 mg by mouth daily.    . Coenzyme Q10 (COQ10) 100 MG CAPS Take 100 mg by mouth daily.     Marland Kitchen conjugated estrogens (PREMARIN) vaginal cream Place 1 Applicatorful vaginally as needed (dyspareunia (Use as directed)). Reported on 05/11/2015    . diclofenac Sodium (VOLTAREN) 1 % GEL Apply 2 g topically 4 (four) times daily as needed (ankle pain.).     Marland Kitchen fluticasone (FLONASE) 50 MCG/ACT nasal spray Place 2 sprays into both nostrils 2 (two) times daily.     . furosemide (LASIX) 40 MG tablet Take 1 tablet (40 mg total) by mouth every morning AND 0.5 tablets (20 mg total) every evening. 45 tablet 3  . levothyroxine (SYNTHROID) 25 MCG tablet Take 25 mcg by mouth daily before breakfast.    . Magnesium Oxide 500 MG CAPS Take 1,000 mg by mouth 2 (two) times daily.     . meclizine (ANTIVERT) 25 MG tablet Take 1 tablet (25 mg total) by mouth 3 (three) times daily as needed for dizziness. 30 tablet 0  . metoprolol  succinate (TOPROL-XL) 25 MG 24 hr tablet Take 1 tablet (25 mg total) by mouth at bedtime. 90 tablet 3  . potassium chloride SA (KLOR-CON) 20 MEQ tablet Take 1 tablet (20 mEq total) by mouth daily. 90 tablet 3  . spironolactone (ALDACTONE) 25 MG tablet Take 1 tablet (25 mg total) by mouth at bedtime. 90 tablet 3  . zolpidem (AMBIEN) 5 MG tablet Take 5 mg by mouth at bedtime as needed for sleep.     No current facility-administered medications for this visit.    Allergies as of 06/28/2019 - Review Complete 06/17/2019  Allergen Reaction Noted  . Clindamycin/lincomycin Itching 10/13/2010  . Dramamine ii [meclizine hcl] Other (See Comments) 12/16/2018  . Pneumococcal vaccines Itching and Swelling 03/16/2015  . Sulfonamide derivatives Itching and Swelling     Family History  Problem Relation Age of Onset  . Uterine  cancer Mother   . Prostate cancer Father   . Heart disease Father   . Heart attack Father   . Colon cancer Neg Hx     Social History   Socioeconomic History  . Marital status: Married    Spouse name: Not on file  . Number of children: 2  . Years of education: Not on file  . Highest education level: Not on file  Occupational History  . Occupation: retired  Tobacco Use  . Smoking status: Former Smoker    Packs/day: 0.50    Years: 20.00    Pack years: 10.00    Types: Cigarettes    Quit date: 04/18/1978    Years since quitting: 41.2  . Smokeless tobacco: Never Used  Substance and Sexual Activity  . Alcohol use: Yes    Alcohol/week: 14.0 standard drinks    Types: 14 Glasses of wine per week    Comment: has wine before dinner   . Drug use: No  . Sexual activity: Not Currently  Other Topics Concern  . Not on file  Social History Narrative   Patient had never smoked.   Alcohol use- yes   Daily caffeine use- coffee   Illicit drug use- no   Occupation:retired    Social Determinants of Health   Financial Resource Strain:   . Difficulty of Paying Living Expenses:    Food Insecurity:   . Worried About Running Out of Food in the Last Year:   . Ran Out of Food in the Last Year:   Transportation Needs:   . Lack of Transportation (Medical):   . Lack of Transportation (Non-Medical):   Physical Activity:   . Days of Exercise per Week:   . Minutes of Exercise per Session:   Stress:   . Feeling of Stress :   Social Connections:   . Frequency of Communication with Friends and Family:   . Frequency of Social Gatherings with Friends and Family:   . Attends Religious Services:   . Active Member of Clubs or Organizations:   . Attends Club or Organization Meetings:   . Marital Status:   Intimate Partner Violence:   . Fear of Current or Ex-Partner:   . Emotionally Abused:   . Physically Abused:   . Sexually Abused:     Review of Systems:    Constitutional: No weight loss, fever or chills Cardiovascular: No chest pain  Respiratory: No SOB  Gastrointestinal: See HPI and otherwise negative   Physical Exam:  Vital signs: BP 104/68   Pulse (!) 56   Temp 97.8 F (36.6 C)   Ht 5' 5" (1.651 m)   Wt 127 lb 9.6 oz (57.9 kg)   BMI 21.23 kg/m   Constitutional:   Pleasant Elderly Caucasian female appears to be in NAD, Well developed, Well nourished, alert and cooperative Respiratory: Respirations even and unlabored. Lungs clear to auscultation bilaterally.   No wheezes, crackles, or rhonchi.  Cardiovascular: Normal S1, S2. No MRG. Regular rate and rhythm. No peripheral edema, cyanosis or pallor.  Gastrointestinal:  Soft, nondistended, nontender. No rebound or guarding. Normal bowel sounds. No appreciable masses or hepatomegaly. Rectal:  Not performed.  Psychiatric: Demonstrates good judgement and reason without abnormal affect or behaviors.  RELEVANT LABS AND IMAGING: CBC    Component Value Date/Time   WBC 6.1 03/01/2019 1129   WBC 6.2 01/07/2019 1156   RBC 4.33 03/01/2019 1129   RBC 3.85 (L) 01/07/2019 1156   HGB 13.0 03/01/2019 1129     HGB 14.2  08/17/2011 0807   HCT 39.6 03/01/2019 1129   HCT 42.8 08/17/2011 0807   PLT 141 (L) 03/01/2019 1129   MCV 92 03/01/2019 1129   MCV 93.9 08/17/2011 0807   MCH 30.0 03/01/2019 1129   MCH 32.2 01/07/2019 1156   MCHC 32.8 03/01/2019 1129   MCHC 33.2 01/07/2019 1156   RDW 13.1 03/01/2019 1129   RDW 13.8 08/17/2011 0807   LYMPHSABS 0.8 12/16/2018 0727   LYMPHSABS 3.2 08/17/2011 0807   MONOABS 0.9 12/16/2018 0727   MONOABS 0.8 08/17/2011 0807   EOSABS 0.0 12/16/2018 0727   EOSABS 0.2 08/17/2011 0807   BASOSABS 0.0 12/16/2018 0727   BASOSABS 0.1 08/17/2011 0807    CMP     Component Value Date/Time   NA 133 (L) 06/17/2019 1230   NA 135 03/01/2019 1129   NA 141 08/17/2011 0807   K 4.0 06/17/2019 1230   K 4.2 08/17/2011 0807   CL 97 (L) 06/17/2019 1230   CL 94 (L) 08/17/2011 0807   CO2 25 06/17/2019 1230   CO2 28 08/17/2011 0807   GLUCOSE 95 06/17/2019 1230   GLUCOSE 110 08/17/2011 0807   BUN 23 06/17/2019 1230   BUN 17 03/01/2019 1129   BUN 14 08/17/2011 0807   CREATININE 1.21 (H) 06/17/2019 1230   CREATININE 1.18 (H) 07/10/2015 1028   CALCIUM 9.0 06/17/2019 1230   CALCIUM 8.6 08/17/2011 0807   PROT 6.9 06/17/2019 1230   PROT 7.7 08/17/2011 0807   ALBUMIN 3.6 06/17/2019 1230   ALBUMIN 3.8 08/17/2011 0807   AST 61 (H) 06/17/2019 1230   AST 65 (H) 08/17/2011 0807   ALT 53 (H) 06/17/2019 1230   ALT 64 (H) 08/17/2011 0807   ALKPHOS 88 06/17/2019 1230   ALKPHOS 82 08/17/2011 0807   BILITOT 1.8 (H) 06/17/2019 1230   BILITOT 1.00 08/17/2011 0807   GFRNONAA 42 (L) 06/17/2019 1230   GFRAA 49 (L) 06/17/2019 1230   Hepatic Function Latest Ref Rng & Units 06/17/2019 05/09/2019 03/18/2019  Total Protein 6.5 - 8.1 g/dL 6.9 6.3(L) 6.5  Albumin 3.5 - 5.0 g/dL 3.6 3.3(L) 3.3(L)  AST 15 - 41 U/L 61(H) 145(H) 51(H)  ALT 0 - 44 U/L 53(H) 107(H) 50(H)  Alk Phosphatase 38 - 126 U/L 88 93 77  Total Bilirubin 0.3 - 1.2 mg/dL 1.8(H) 1.8(H) 1.0  Bilirubin, Direct 0.1 - 0.5 mg/dL - - -     Assessment: 1.  Elevated LFTs: It appears these have been elevated since at least 2020, see above they seem to cycle up and down, patient is on Amiodarone and reports being on this for the past 10 years, recent ultrasound normal; consider relation to medication versus autoimmune cause versus other  Plan: 1.  Patient is on Amiodarone which can cause increases in liver enzymes. She is aware and tells me that her cardiologist is also aware. Apparently she is seeing someone to talk about ablative therapy for her A. fib soon. 2. We will run further labs to consider autoimmune causes and others. Ordered repeat hepatic function panel, PT, INR, iron studies with ferritin, AMA, ASMA, ANA, alpha 1 fetoprotein, ceruloplasmin and hepatitis studies 3. We will discuss results with patient. Explained that it can sometimes take up to a week to return. She will follow with [place] as directed.  Ellouise Newer, PA-C [facility] 06/28/2019, 11:31 AM  Cc: [name], *

## 2019-07-01 ENCOUNTER — Encounter: Payer: Self-pay | Admitting: Cardiology

## 2019-07-01 ENCOUNTER — Ambulatory Visit (INDEPENDENT_AMBULATORY_CARE_PROVIDER_SITE_OTHER): Payer: Medicare Other | Admitting: Cardiology

## 2019-07-01 ENCOUNTER — Other Ambulatory Visit: Payer: Self-pay

## 2019-07-01 VITALS — BP 110/72 | HR 60 | Ht 65.0 in | Wt 126.0 lb

## 2019-07-01 DIAGNOSIS — I428 Other cardiomyopathies: Secondary | ICD-10-CM | POA: Diagnosis not present

## 2019-07-01 DIAGNOSIS — I4891 Unspecified atrial fibrillation: Secondary | ICD-10-CM | POA: Diagnosis not present

## 2019-07-01 DIAGNOSIS — Z01812 Encounter for preprocedural laboratory examination: Secondary | ICD-10-CM | POA: Diagnosis not present

## 2019-07-01 LAB — CUP PACEART INCLINIC DEVICE CHECK
Date Time Interrogation Session: 20210315164908
Implantable Lead Implant Date: 20170328
Implantable Lead Implant Date: 20170328
Implantable Lead Implant Date: 20170328
Implantable Lead Location: 753858
Implantable Lead Location: 753859
Implantable Lead Location: 753860
Implantable Lead Model: 4598
Implantable Lead Model: 5076
Implantable Lead Model: 6935
Implantable Pulse Generator Implant Date: 20170328

## 2019-07-01 NOTE — Patient Instructions (Addendum)
Medication Instructions:  Your physician recommends that you continue on your current medications as directed. Please refer to the Current Medication list given to you today.  *If you need a refill on your cardiac medications before your next appointment, please call your pharmacy*   Lab Work: Your physician recommends that you return for lab work between 4/5 - 4/16 If you have labs (blood work) drawn today and your tests are completely normal, you will receive your results only by: Marland Kitchen MyChart Message (if you have MyChart) OR . A paper copy in the mail If you have any lab test that is abnormal or we need to change your treatment, we will call you to review the results.   Testing/Procedures: Your physician has requested that you have cardiac CT within 7 days PRIOR to ablation. Cardiac computed tomography (CT) is a painless test that uses an x-ray machine to take clear, detailed pictures of your heart. For further information please visit [email or web address]. Please follow instruction sheet as given.  Your physician has recommended that you have an ablation. Catheter ablation is a medical procedure used to treat some cardiac arrhythmias (irregular heartbeats). During catheter ablation, a long, thin, flexible tube is put into a blood vessel in your groin (upper thigh), or neck. This tube is called an ablation catheter. It is then guided to your heart through the blood vessel. Radio frequency waves destroy small areas of heart tissue where abnormal heartbeats may cause an arrhythmia to start. Please see the instructions below.  Follow-Up: At [facility], you and your health needs are our priority.  As part of our continuing mission to provide you with exceptional heart care, we have created designated Provider Care Teams.  These Care Teams include your primary Cardiologist (physician) and Advanced Practice Providers (APPs -  Physician Assistants and Nurse Practitioners) who all work together to  provide you with the care you need, when you need it.  We recommend signing up for the patient portal called "MyChart".  Sign up information is provided on this After Visit Summary.  MyChart is used to connect with patients for Virtual Visits (Telemedicine).  Patients are able to view lab/test results, encounter notes, upcoming appointments, etc.  Non-urgent messages can be sent to your provider as well.   To learn more about what you can do with MyChart, go to [email or web address].    Your next appointment:   4 month(s)  The format for your next appointment:   In Person  Provider:   Allegra Lai, MD   Thank you for choosing [facility]!!   Trinidad Curet, RN [phone]    Other Instructions  Your cardiac CT will be scheduled at one of the below locations:   [facility] [address] [place], [place] [address] [phone]  Please arrive at the [facility] main entrance of [facility] on _______________ @ _____________, please arrive 30 minutes prior to test start time. Proceed to the [facility] Radiology Department (first floor) to check-in and test prep.  Please follow these instructions carefully (unless otherwise directed):  On the Night Before the Test: . Be sure to Drink plenty of water. . Do not consume any caffeinated/decaffeinated beverages or chocolate 12 hours prior to your test. . Do not take any antihistamines 12 hours prior to your test.  On the Day of the Test: . Drink plenty of water. Do not drink any water within one hour of the test. . Do not eat any food 4 hours  prior to the test. . You may take your regular medications prior to the test.  . Take your Toprol at bedtime (as usual) the night before this test. . HOLD Furosemide/Hydrochlorothiazide morning of the test. . FEMALES- please wear underwire-free bra if available      After the Test: . Drink plenty of water. . After receiving IV contrast, you may experience a  mild flushed feeling. This is normal. . On occasion, you may experience a mild rash up to 24 hours after the test. This is not dangerous. If this occurs, you can take Benadryl 25 mg and increase your fluid intake. . If you experience trouble breathing, this can be serious. If it is severe call 911 IMMEDIATELY. If it is mild, please call our office  Once we have confirmed authorization from your insurance company, we will call you to set up a date and time for your test.   For non-scheduling related questions, please contact the cardiac imaging nurse navigator should you have any questions/concerns: Marchia Bond, RN Navigator Cardiac Imaging [facility] [phone] office  For scheduling needs, including cancellations and rescheduling, please call [phone].      Electrophysiology/Ablation Procedure Instructions   You are scheduled for a(n)  ablation on 08/14/19 with Dr. Allegra Lai.   1.   Pre procedure testing-             A.  LAB WORK ---  Between 4/5 - 4/16  for your pre procedure blood work.                 B. COVID TEST-- On 08/12/2019 @ 10:00 am- You will go to [facility] hospital ([place]) for your Covid testing.   This is a drive thru test site.  There will be multiple testing areas.  Be sure to share with the first checkpoint that you are there for pre-procedure/surgery testing. This will put you into the right (yellow) lane that leads to the PAT testing team. Stay in your car and the nurse team will come to your car to test you.  After you are tested please go home and self quarantine until the day of your procedure.     2. On the day of your procedure 07/25/19 you will go to [facility] hospital ([address]) at 6:30 am.  Dennis Bast will go to the main entrance A [employer]) and enter where the [employer] are.  Your driver will drop you off and you will head down the hallway to ADMITTING.  You may have one support person  come in to the hospital with you.  They will be asked to wait in the waiting room.   3.   Do not eat or drink after midnight prior to your procedure.   4.   Do NOT take any medications the morning of your procedure.   5.  Plan for an overnight stay.  If you use your phone frequently bring your phone charger.   6. You will follow up with the AFIB clinic 4 weeks after your procedure.  You will follow up with Dr. Curt Bears  3 months after your procedure.  These appointments will be made for you.   * If you have ANY questions please call the office [phone] and ask for Wilbur Oakland RN or send me a MyChart message   * Occasionally, EP Studies and ablations can become lengthy.  Please make your family aware of this before  your procedure starts.  Average time ranges from 2-8 hours for EP studies/ablations.  Your physician will call your family after the procedure with the results.                                     Cardiac Ablation Cardiac ablation is a procedure to disable (ablate) a small amount of heart tissue in very specific places. The heart has many electrical connections. Sometimes these connections are abnormal and can cause the heart to beat very fast or irregularly. Ablating some of the problem areas can improve the heart rhythm or return it to normal. Ablation may be done for people who:  Have Wolff-Parkinson-White syndrome.  Have fast heart rhythms (tachycardia).  Have taken medicines for an abnormal heart rhythm (arrhythmia) that were not effective or caused side effects.  Have a high-risk heartbeat that may be life-threatening. During the procedure, a small incision is made in the neck or the groin, and a long, thin, flexible tube (catheter) is inserted into the incision and moved to the heart. Small devices (electrodes) on the tip of the catheter will send out electrical currents. A type of X-ray (fluoroscopy) will be used to help guide the catheter and to provide images of the  heart. Tell a health care provider about:  Any allergies you have.  All medicines you are taking, including vitamins, herbs, eye drops, creams, and over-the-counter medicines.  Any problems you or family members have had with anesthetic medicines.  Any blood disorders you have.  Any surgeries you have had.  Any medical conditions you have, such as kidney failure.  Whether you are pregnant or may be pregnant. What are the risks? Generally, this is a safe procedure. However, problems may occur, including:  Infection.  Bruising and bleeding at the catheter insertion site.  Bleeding into the chest, especially into the sac that surrounds the heart. This is a serious complication.  Stroke or blood clots.  Damage to other structures or organs.  Allergic reaction to medicines or dyes.  Need for a permanent pacemaker if the normal electrical system is damaged. A pacemaker is a small computer that sends electrical signals to the heart and helps your heart beat normally.  The procedure not being fully effective. This may not be recognized until months later. Repeat ablation procedures are sometimes required. What happens before the procedure?  Follow instructions from your health care provider about eating or drinking restrictions.  Ask your health care provider about: ? Changing or stopping your regular medicines. This is especially important if you are taking diabetes medicines or blood thinners. ? Taking medicines such as aspirin and ibuprofen. These medicines can thin your blood. Do not take these medicines before your procedure if your health care provider instructs you not to.  Plan to have someone take you home from the hospital or clinic.  If you will be going home right after the procedure, plan to have someone with you for 24 hours. What happens during the procedure?  To lower your risk of infection: ? Your health care team will wash or sanitize their hands. ? Your  skin will be washed with soap. ? Hair may be removed from the incision area.  An IV tube will be inserted into one of your veins.  You will be given a medicine to help you relax (sedative).  The skin on your neck or groin will be numbed.  An incision will be made in your neck or your groin.  A needle will be inserted through the incision and into a large vein in your neck or groin.  A catheter will be inserted into the needle and moved to your heart.  Dye may be injected through the catheter to help your surgeon see the area of the heart that needs treatment.  Electrical currents will be sent from the catheter to ablate heart tissue in desired areas. There are three types of energy that may be used to ablate heart tissue: ? Heat (radiofrequency energy). ? Laser energy. ? Extreme cold (cryoablation).  When the necessary tissue has been ablated, the catheter will be removed.  Pressure will be held on the catheter insertion area to prevent excessive bleeding.  A bandage (dressing) will be placed over the catheter insertion area. The procedure may vary among health care providers and hospitals. What happens after the procedure?  Your blood pressure, heart rate, breathing rate, and blood oxygen level will be monitored until the medicines you were given have worn off.  Your catheter insertion area will be monitored for bleeding. You will need to lie still for a few hours to ensure that you do not bleed from the catheter insertion area.  Do not drive for 24 hours or as long as directed by your health care provider. Summary  Cardiac ablation is a procedure to disable (ablate) a small amount of heart tissue in very specific places. Ablating some of the problem areas can improve the heart rhythm or return it to normal.  During the procedure, electrical currents will be sent from the catheter to ablate heart tissue in desired areas. This information is not intended to replace advice  given to you by your health care provider. Make sure you discuss any questions you have with your health care provider. Document Revised: 09/25/2017 Document Reviewed: 02/22/2016 Elsevier Patient Education  [place].

## 2019-07-01 NOTE — Progress Notes (Signed)
Electrophysiology Office Note   Date:  07/01/2019   ID:  Cynthia, Frank [birth year], MRN [id number]  PCP:  Cynthia Koch, MD  Cardiologist:  Cynthia Frank Primary Electrophysiologist:  Asheley Hellberg Meredith Leeds, MD    Chief Complaint: CHF   History of Present Illness: Cynthia Frank is a 84 y.o. female who is being seen today for the evaluation of CHF at the request of Cynthia Dresser, MD. Presenting today for electrophysiology evaluation.  She has a history of chronic systolic heart failure due to nonischemic cardiomyopathy, paroxysmal atrial fibrillation, ovarian and colon cancers.  She has had a cardiomyopathy for approximately 15 years.  The cause was thought due to Adriamycin.  She is currently on amiodarone for her atrial fibrillation.  She has a Medtronic CRT-D implanted 07/06/2015.  Most recent echo shows an improvement in her ejection fraction to 40 to 45%.  He has unfortunately been having an increased burden of atrial arrhythmias.  Today, she denies symptoms of palpitations, chest pain, shortness of breath, orthopnea, PND, lower extremity edema, claudication, dizziness, presyncope, syncope, bleeding, or neurologic sequela. The patient is tolerating medications without difficulties.  She does have NYHA class II symptoms.  He says that when she is in atrial fibrillation, she has much more weakness and fatigue.  She is in sinus rhythm and has been since the beginning of the month.   Past Medical History:  Diagnosis Date  . AICD (automatic cardioverter/defibrillator) present   . Arthritis    "left ankle" (07/14/2015)  . Atrial fibrillation ([place])   . CHF (congestive heart failure) ([place])   . Colon cancer ([place]) 03/2006   T3, N0  . Colon polyps 12/07/2010  . Coronary artery disease    nonobstructive with 30% D1  . GERD (gastroesophageal reflux disease)   . History of ovarian cancer 1985  . Hx: UTI (urinary tract infection)   . Hyperlipidemia   . Hypertension   .  Internal hemorrhoid   . Nonischemic cardiomyopathy ([place])    last EF assessment 40% by MUGA  . Partial bowel obstruction ([place])   . PVC (premature ventricular contraction)    Past Surgical History:  Procedure Laterality Date  . ABDOMINAL HYSTERECTOMY  1979  . ANKLE CLOSED REDUCTION  10/16/2011   Procedure: CLOSED REDUCTION ANKLE;  Surgeon: Tobi Bastos, MD;  Location: [facility];  Service: Orthopedics;  Laterality: Left;  . COLECTOMY  2007   for colon cancer  . COLONOSCOPY N/A 03/11/2013   Procedure: COLONOSCOPY;  Surgeon: Ladene Artist, MD;  Location: [facility];  Service: Endoscopy;  Laterality: N/A;  . COLONOSCOPY WITH PROPOFOL N/A 04/21/2015   Procedure: COLONOSCOPY WITH PROPOFOL;  Surgeon: Ladene Artist, MD;  Location: [facility];  Service: Endoscopy;  Laterality: N/A;  . COLONOSCOPY WITH PROPOFOL N/A 02/05/2018   Procedure: COLONOSCOPY WITH PROPOFOL Hemostasis Clip;  Surgeon: Ladene Artist, MD;  Location: [facility];  Service: Endoscopy;  Laterality: N/A;  . EP IMPLANTABLE DEVICE N/A 07/14/2015   Procedure: BiV ICD Insertion CRT-D;  Surgeon: Evans Lance, MD;  Location: [facility];  Service: Cardiovascular;  Laterality: N/A;  . ESOPHAGOGASTRODUODENOSCOPY (EGD) WITH PROPOFOL N/A 04/21/2015   Procedure: ESOPHAGOGASTRODUODENOSCOPY (EGD) WITH PROPOFOL;  Surgeon: Ladene Artist, MD;  Location: [facility];  Service: Endoscopy;  Laterality: N/A;  . FRACTURE SURGERY    . INSERTION OF ICD Left 07/14/2015  . LAPAROTOMY  1980   "adhesions"  . LAPAROTOMY  1989   laparotomy for takedown of  intestinal obstruction secondary to adhesions   . OOPHORECTOMY  1961  . OVARY SURGERY Right 1985   resection of right ovarian cancer  . POLYPECTOMY  02/05/2018   Procedure: POLYPECTOMY;  Surgeon: Ladene Artist, MD;  Location: [facility];  Service: Endoscopy;;  . SMALL INTESTINE SURGERY  1985  . TONSILLECTOMY  1944  . TOTAL HIP ARTHROPLASTY Right 12/18/2018   Procedure: TOTAL HIP  ARTHROPLASTY ANTERIOR APPROACH;  Surgeon: Rod Can, MD;  Location: [facility];  Service: Orthopedics;  Laterality: Right;     Current Outpatient Medications  Medication Sig Dispense Refill  . acetaminophen (TYLENOL) 500 MG tablet Take 1,000 mg by mouth every 6 (six) hours as needed (for pain.).    Marland Kitchen amiodarone (PACERONE) 200 MG tablet Take 1 tablet (200 mg total) by mouth daily. (Patient taking differently: Take 200 mg by mouth at bedtime. ) 90 tablet 3  . apixaban (ELIQUIS) 5 MG TABS tablet Take 1 tablet (5 mg total) by mouth 2 (two) times daily. 60 tablet 10  . atorvastatin (LIPITOR) 40 MG tablet Take 1 tablet (40 mg total) by mouth daily at 6 PM. 90 tablet 3  . citalopram (CELEXA) 20 MG tablet Take 20 mg by mouth daily.    . Coenzyme Q10 (COQ10) 100 MG CAPS Take 100 mg by mouth daily.     Marland Kitchen conjugated estrogens (PREMARIN) vaginal cream Place 1 Applicatorful vaginally as needed (dyspareunia (Use as directed)). Reported on 05/11/2015    . diclofenac Sodium (VOLTAREN) 1 % GEL Apply 2 g topically 4 (four) times daily as needed (ankle pain.).     Marland Kitchen fluticasone (FLONASE) 50 MCG/ACT nasal spray Place 2 sprays into both nostrils 2 (two) times daily.     . furosemide (LASIX) 40 MG tablet Take 1 tablet (40 mg total) by mouth every morning AND 0.5 tablets (20 mg total) every evening. 45 tablet 3  . levothyroxine (SYNTHROID) 25 MCG tablet Take 25 mcg by mouth daily before breakfast.    . Magnesium Oxide 500 MG CAPS Take 1,000 mg by mouth 2 (two) times daily.     . meclizine (ANTIVERT) 25 MG tablet Take 1 tablet (25 mg total) by mouth 3 (three) times daily as needed for dizziness. 30 tablet 0  . metoprolol succinate (TOPROL-XL) 25 MG 24 hr tablet Take 1 tablet (25 mg total) by mouth at bedtime. 90 tablet 3  . potassium chloride SA (KLOR-CON) 20 MEQ tablet Take 1 tablet (20 mEq total) by mouth daily. 90 tablet 3  . spironolactone (ALDACTONE) 25 MG tablet Take 1 tablet (25 mg total) by mouth at bedtime.  90 tablet 3  . zolpidem (AMBIEN) 5 MG tablet Take 5 mg by mouth at bedtime as needed for sleep.     No current facility-administered medications for this visit.    Allergies:   Clindamycin/lincomycin, Dramamine ii [meclizine hcl], Pneumococcal vaccines, and Sulfonamide derivatives   Social History:  The patient  reports that she quit smoking about 41 years ago. Her smoking use included cigarettes. She has a 10.00 pack-year smoking history. She has never used smokeless tobacco. She reports current alcohol use of about 14.0 standard drinks of alcohol per week. She reports that she does not use drugs.   Family History:  The patient's family history includes Heart attack in her father; Heart disease in her father; Prostate cancer in her father; Uterine cancer in her mother.    ROS:  Please see the history of present illness.   Otherwise, review of  systems is positive for none.   All other systems are reviewed and negative.    PHYSICAL EXAM: VS:  BP 110/72   Pulse 60   Ht 5\' 5"  (1.651 m)   Wt 126 lb (57.2 kg)   BMI 20.97 kg/m  , BMI Body mass index is 20.97 kg/m. GEN: Well nourished, well developed, in no acute distress  HEENT: normal  Neck: no JVD, carotid bruits, or masses Cardiac: RRR; no murmurs, rubs, or gallops,no edema  Respiratory:  clear to auscultation bilaterally, normal work of breathing GI: soft, nontender, nondistended, + BS MS: no deformity or atrophy  Skin: warm and dry, device pocket is well healed Neuro:  Strength and sensation are intact Psych: euthymic mood, full affect  EKG:  EKG is ordered today. Personal review of the ekg ordered shows AV paced  Device interrogation is reviewed today in detail.  See PaceArt for details.   Recent Labs: 12/21/2018: Magnesium 2.2 03/01/2019: Hemoglobin 13.0; Platelets 141 03/18/2019: TSH 3.222 05/09/2019: B Natriuretic Peptide 2,241.2 06/17/2019: BUN 23; Creatinine, Ser 1.21; Potassium 4.0; Sodium 133 06/28/2019: ALT 46     Lipid Panel     Component Value Date/Time   CHOL 138 05/02/2018 0946   TRIG 90 05/02/2018 0946   HDL 79 05/02/2018 0946   CHOLHDL 1.7 05/02/2018 0946   VLDL 18 05/02/2018 0946   LDLCALC 41 05/02/2018 0946     Wt Readings from Last 3 Encounters:  07/01/19 126 lb (57.2 kg)  06/28/19 127 lb 9.6 oz (57.9 kg)  06/17/19 126 lb (57.2 kg)      Other studies Reviewed: Additional studies/ records that were reviewed today include: TTE 12/16/18  Review of the above records today demonstrates:  1. The left ventricle has mildly reduced systolic function, with an  ejection fraction of 45-50%. The cavity size was normal. Left ventricular  diastolic Doppler parameters are indeterminate. No evidence of left  ventricular regional wall motion  abnormalities.  2. The right ventricle has normal systolic function. The cavity was  normal. There is no increase in right ventricular wall thickness. Right  ventricular systolic pressure is moderately elevated.  3. Left atrial size was mildly dilated.  4. There is mild mitral annular calcification present.  5. Tricuspid valve regurgitation is mild-moderate.  6. The aorta is normal unless otherwise noted.  7. Ventricular pacing noted. No visible A wave on mitral inflow or on  mitral annulus tissue Doppler suggests atrial fibrillation at the time of  the study (or atrial mechanical failure). This limits the accuracy of  diastolic function assessment.  8. When compared to the prior study: 12/16/2015, there is improved left  ventricular systolic function due to improved lateral-septal synchrony by  CRT pacing. Despite this, there is worsened pulmonary artery hypertension.    ASSESSMENT AND PLAN:  1.  Chronic systolic heart failure due to nonischemic cardiomyopathy: Ejection fraction fortunately has improved to 45 to 50%.  She has NYHA class II symptoms.  Currently on Toprol-XL, Aldactone.  She was lightheaded on Entresto and thus has not been  on this medication.  Plan per heart failure team.  2.  Paroxysmal atrial fibrillation/atrial tachycardia: Currently on amiodarone.  Also on Eliquis.  CHA2DS2-VASc of 3.  Unfortunately, it appears that she is having side effects from amiodarone, with elevated LFTs and hypothyroidism.  It would be ideal for her to get off of this medication.  I offered her the option of stopping amiodarone and switching to dofetilide once amiodarone has washed out  versus ablation.  Risks and benefits of ablation were discussed and include bleeding, tamponade, heart block, stroke, damage surrounding organs.  She understands these risks and has agreed to the procedure.  Case discussed with primary cardiology    Current medicines are reviewed at length with the patient today.   The patient does not have concerns regarding her medicines.  The following changes were made today:  none  Labs/ tests ordered today include:  Orders Placed This Encounter  Procedures  . CT CARDIAC MORPH/PULM VEIN W/CM&W/O CA SCORE  . Basic metabolic panel  . CBC  . EKG 12-Lead     Disposition:   FU with Dyshaun Bonzo 3 months  Signed, Lansing Sigmon Meredith Leeds, MD  07/01/2019 12:13 PM     [place] [address] [phone] (office) [phone] (fax)

## 2019-07-06 LAB — HEPATITIS B SURFACE ANTIBODY,QUALITATIVE: Hep B S Ab: NONREACTIVE

## 2019-07-06 LAB — ANTI-NUCLEAR AB-TITER (ANA TITER): ANA Titer 1: 1:40 {titer} — ABNORMAL HIGH

## 2019-07-06 LAB — ANTI-SMOOTH MUSCLE ANTIBODY, IGG: Actin (Smooth Muscle) Antibody (IGG): 23 U — ABNORMAL HIGH (ref <20)

## 2019-07-06 LAB — HEPATITIS C ANTIBODY
Hepatitis C Ab: NONREACTIVE
SIGNAL TO CUT-OFF: 0.04 (ref <1.00)

## 2019-07-06 LAB — HEPATITIS B SURFACE ANTIGEN: Hepatitis B Surface Ag: NONREACTIVE

## 2019-07-06 LAB — CERULOPLASMIN: Ceruloplasmin: 34 mg/dL (ref 18–53)

## 2019-07-06 LAB — MITOCHONDRIAL ANTIBODIES: Mitochondrial M2 Ab, IgG: <=20 U

## 2019-07-06 LAB — ALPHA-1-ANTITRYPSIN: A-1 Antitrypsin, Ser: 166 mg/dL (ref 83–199)

## 2019-07-06 LAB — HEPATITIS A ANTIBODY, TOTAL: Hepatitis A AB,Total: REACTIVE — AB

## 2019-07-06 LAB — ANA: Anti Nuclear Antibody (ANA): POSITIVE — AB

## 2019-07-11 ENCOUNTER — Other Ambulatory Visit: Payer: Self-pay

## 2019-07-11 DIAGNOSIS — R7989 Other specified abnormal findings of blood chemistry: Secondary | ICD-10-CM

## 2019-07-16 ENCOUNTER — Other Ambulatory Visit: Payer: Self-pay

## 2019-07-22 ENCOUNTER — Ambulatory Visit (INDEPENDENT_AMBULATORY_CARE_PROVIDER_SITE_OTHER): Payer: Medicare Other

## 2019-07-22 DIAGNOSIS — I5022 Chronic systolic (congestive) heart failure: Secondary | ICD-10-CM

## 2019-07-22 DIAGNOSIS — Z95810 Presence of automatic (implantable) cardiac defibrillator: Secondary | ICD-10-CM | POA: Diagnosis not present

## 2019-07-24 NOTE — Progress Notes (Signed)
EPIC Encounter for ICM Monitoring  Patient Name: Cynthia Frank is a 84 y.o. female Date: 07/24/2019 Primary Care Physican: [name], MD Primary Cardiologist:[place] Electrophysiologist:Taylor Bi-V Pacing:97.8% 4/7/2021Weight: 126lbs  Since 22-Jul-2019 Time in AT/AF (0.0%)  Spoke with patient and reports feeling well at this time.  Denies fluid symptoms.    Opitvol thoracic impedancenormal.  Prescribed: Furosemide40 mgTake 1 tablet (40 mg total) by mouth every morning AND 0.5 tablets (20 mg total) every evening.  Labs: 06/17/2019 Creatinine 1.21, BUN 23, Potassium 4.0, Sodium 133, GFR 42-49 05/20/2019 Creatinine 1.38, BUN 19, Potassium 3.8, Sodium 134, GFR 36-41  05/09/2019 Creatinine1.52, BUN21, Potassium4.1, [id number], [id number] 03/28/2019 Creatinine1.25, BUN19, Potassium4.1, Sodium135, [id number]  03/18/2019 Creatinine1.20, BUN17, Potassium4.0, Sodium134, [id number]  03/01/2019 Creatinine1.15, BUN17, Potassium4.0, Sodium135, GFR45-52 01/09/2019 Creatinine1.26, BUN17, Potassium4.8, Sodium133, GFR40-46 01/07/2019 Creatinine1.18, BUN16, Potassium3.1, Sodium136, [id number]  12/21/2018 Creatinine 0.99, BUN 18, Potassium 4.3, Sodium 130, GFR 54->60 A complete set of results can be found in Results Review.  Recommendations: No changes and encouraged to call if experiencing any fluid symptoms.  Follow-up plan: ICM clinic phone appointment on5/02/2020. 91 day device clinic remote transmission 07/30/2019.   Copy of ICM check sent to Dr.Taylor.   3 month ICM trend: 07/22/2019    1 Year ICM trend:       Rosalene Billings, RN 07/24/2019 10:46 AM

## 2019-07-30 ENCOUNTER — Ambulatory Visit (INDEPENDENT_AMBULATORY_CARE_PROVIDER_SITE_OTHER): Payer: Medicare Other | Admitting: *Deleted

## 2019-07-30 DIAGNOSIS — I428 Other cardiomyopathies: Secondary | ICD-10-CM | POA: Diagnosis not present

## 2019-07-31 LAB — CUP PACEART REMOTE DEVICE CHECK
Battery Remaining Longevity: 23 mo
Battery Voltage: 2.93 V
Brady Statistic AP VP Percent: 98.65 %
Brady Statistic AP VS Percent: 0.23 %
Brady Statistic AS VP Percent: 0.21 %
Brady Statistic AS VS Percent: 0.92 %
Brady Statistic RA Percent Paced: 98.86 %
Brady Statistic RV Percent Paced: 98.85 %
Date Time Interrogation Session: 20210414003429
HighPow Impedance: 64 Ohm
Implantable Lead Implant Date: 20170328
Implantable Lead Implant Date: 20170328
Implantable Lead Implant Date: 20170328
Implantable Lead Location: 753858
Implantable Lead Location: 753859
Implantable Lead Location: 753860
Implantable Lead Model: 4598
Implantable Lead Model: 5076
Implantable Lead Model: 6935
Implantable Pulse Generator Implant Date: 20170328
Lead Channel Impedance Value: 304 Ohm
Lead Channel Impedance Value: 342 Ohm
Lead Channel Impedance Value: 361 Ohm
Lead Channel Impedance Value: 361 Ohm
Lead Channel Impedance Value: 399 Ohm
Lead Channel Impedance Value: 418 Ohm
Lead Channel Impedance Value: 418 Ohm
Lead Channel Impedance Value: 475 Ohm
Lead Channel Impedance Value: 532 Ohm
Lead Channel Impedance Value: 589 Ohm
Lead Channel Impedance Value: 608 Ohm
Lead Channel Impedance Value: 646 Ohm
Lead Channel Impedance Value: 703 Ohm
Lead Channel Pacing Threshold Amplitude: 0.875 V
Lead Channel Pacing Threshold Amplitude: 1 V
Lead Channel Pacing Threshold Amplitude: 1.125 V
Lead Channel Pacing Threshold Pulse Width: 0.4 ms
Lead Channel Pacing Threshold Pulse Width: 0.4 ms
Lead Channel Pacing Threshold Pulse Width: 0.4 ms
Lead Channel Sensing Intrinsic Amplitude: 0.875 mV
Lead Channel Sensing Intrinsic Amplitude: 0.875 mV
Lead Channel Sensing Intrinsic Amplitude: 6.375 mV
Lead Channel Sensing Intrinsic Amplitude: 9 mV
Lead Channel Setting Pacing Amplitude: 2.25 V
Lead Channel Setting Pacing Amplitude: 2.25 V
Lead Channel Setting Pacing Amplitude: 2.5 V
Lead Channel Setting Pacing Pulse Width: 0.4 ms
Lead Channel Setting Pacing Pulse Width: 0.4 ms
Lead Channel Setting Sensing Sensitivity: 0.3 mV

## 2019-07-31 NOTE — Progress Notes (Signed)
ICD Remote  

## 2019-08-02 ENCOUNTER — Other Ambulatory Visit: Payer: Medicare Other | Admitting: *Deleted

## 2019-08-02 ENCOUNTER — Other Ambulatory Visit: Payer: Self-pay

## 2019-08-02 DIAGNOSIS — I428 Other cardiomyopathies: Secondary | ICD-10-CM

## 2019-08-02 DIAGNOSIS — I4891 Unspecified atrial fibrillation: Secondary | ICD-10-CM

## 2019-08-02 DIAGNOSIS — Z01812 Encounter for preprocedural laboratory examination: Secondary | ICD-10-CM | POA: Diagnosis not present

## 2019-08-03 LAB — BASIC METABOLIC PANEL
BUN/Creatinine Ratio: 21 (ref 12–28)
BUN: 20 mg/dL (ref 8–27)
CO2: 25 mmol/L (ref 20–29)
Calcium: 8.6 mg/dL — ABNORMAL LOW (ref 8.7–10.3)
Chloride: 99 mmol/L (ref 96–106)
Creatinine, Ser: 0.96 mg/dL (ref 0.57–1.00)
GFR calc Af Amer: 64 mL/min/{1.73_m2} (ref >59)
GFR calc non Af Amer: 56 mL/min/{1.73_m2} — ABNORMAL LOW (ref >59)
Glucose: 97 mg/dL (ref 65–99)
Potassium: 3.9 mmol/L (ref 3.5–5.2)
Sodium: 136 mmol/L (ref 134–144)

## 2019-08-06 ENCOUNTER — Encounter: Payer: Self-pay | Admitting: Cardiology

## 2019-08-06 ENCOUNTER — Telehealth (INDEPENDENT_AMBULATORY_CARE_PROVIDER_SITE_OTHER): Payer: Medicare Other | Admitting: Cardiology

## 2019-08-06 ENCOUNTER — Other Ambulatory Visit: Payer: Self-pay

## 2019-08-06 DIAGNOSIS — I480 Paroxysmal atrial fibrillation: Secondary | ICD-10-CM

## 2019-08-06 NOTE — Progress Notes (Signed)
Electrophysiology TeleHealth Note   Due to national recommendations of social distancing due to COVID 19, an audio/video telehealth visit is felt to be most appropriate for this patient at this time.  See Epic message for the patient's consent to telehealth for [facility].   Date:  08/06/2019   ID:  Davis, Rosebush 15-Jun-1935, MRN [id number]  Location: patient's home  Provider location: [address] [place]  Evaluation Performed: Follow-up visit  PCP:  [name], MD  Cardiologist:  Aundra Dubin Electrophysiologist:  Dr Curt Bears  Chief Complaint:  AF  History of Present Illness:    Cynthia Frank is a 84 y.o. female who presents via audio/video conferencing for a telehealth visit today.  Since last being seen in our clinic, the patient reports doing very well.  Today, she denies symptoms of palpitations, chest pain, shortness of breath,  lower extremity edema, dizziness, presyncope, or syncope.  The patient is otherwise without complaint today.  The patient denies symptoms of fevers, chills, cough, or new SOB worrisome for COVID 19.  Has a history significant for nonischemic cardiomyopathy, paroxysmal atrial fibrillation, ovarian and colon cancers.  She has had a cardiomyopathy for approximately 15 years.  She is on amiodarone, but has been having side effects.  She has a CRT-D implanted in 2017.  Plan for atrial fibrillation ablation 08/14/2019.  Today, denies symptoms of palpitations, chest pain, shortness of breath, orthopnea, PND, lower extremity edema, claudication, dizziness, presyncope, syncope, bleeding, or neurologic sequela. The patient is tolerating medications without difficulties.    Past Medical History:  Diagnosis Date  . AICD (automatic cardioverter/defibrillator) present   . Arthritis    "left ankle" (07/14/2015)  . Atrial fibrillation ([place])   . CHF (congestive heart failure) ([place])   . Colon cancer ([place]) 03/2006   T3, N0  .  Colon polyps 12/07/2010  . Coronary artery disease    nonobstructive with 30% D1  . GERD (gastroesophageal reflux disease)   . History of ovarian cancer 1985  . Hx: UTI (urinary tract infection)   . Hyperlipidemia   . Hypertension   . Internal hemorrhoid   . Nonischemic cardiomyopathy ([place])    last EF assessment 40% by MUGA  . Partial bowel obstruction ([place])   . PVC (premature ventricular contraction)     Past Surgical History:  Procedure Laterality Date  . ABDOMINAL HYSTERECTOMY  1979  . ANKLE CLOSED REDUCTION  10/16/2011   Procedure: CLOSED REDUCTION ANKLE;  Surgeon: Tobi Bastos, MD;  Location: [facility];  Service: Orthopedics;  Laterality: Left;  . COLECTOMY  2007   for colon cancer  . COLONOSCOPY N/A 03/11/2013   Procedure: COLONOSCOPY;  Surgeon: Ladene Artist, MD;  Location: [facility];  Service: Endoscopy;  Laterality: N/A;  . COLONOSCOPY WITH PROPOFOL N/A 04/21/2015   Procedure: COLONOSCOPY WITH PROPOFOL;  Surgeon: Ladene Artist, MD;  Location: [facility];  Service: Endoscopy;  Laterality: N/A;  . COLONOSCOPY WITH PROPOFOL N/A 02/05/2018   Procedure: COLONOSCOPY WITH PROPOFOL Hemostasis Clip;  Surgeon: Ladene Artist, MD;  Location: [facility];  Service: Endoscopy;  Laterality: N/A;  . EP IMPLANTABLE DEVICE N/A 07/14/2015   Procedure: BiV ICD Insertion CRT-D;  Surgeon: Evans Lance, MD;  Location: [facility];  Service: Cardiovascular;  Laterality: N/A;  . ESOPHAGOGASTRODUODENOSCOPY (EGD) WITH PROPOFOL N/A 04/21/2015   Procedure: ESOPHAGOGASTRODUODENOSCOPY (EGD) WITH PROPOFOL;  Surgeon: Ladene Artist, MD;  [facility]: [facility];  Service: Endoscopy;  Laterality:  N/A;  . FRACTURE SURGERY    . INSERTION OF ICD Left 07/14/2015  . LAPAROTOMY  1980   "adhesions"  . LAPAROTOMY  1989   laparotomy for takedown of intestinal obstruction secondary to adhesions   . OOPHORECTOMY  1961  . OVARY SURGERY Right 1985   resection of right ovarian cancer  . POLYPECTOMY   02/05/2018   Procedure: POLYPECTOMY;  Surgeon: Ladene Artist, MD;  [facility]: [facility];  Service: Endoscopy;;  . SMALL INTESTINE SURGERY  1985  . TONSILLECTOMY  1944  . TOTAL HIP ARTHROPLASTY Right 12/18/2018   Procedure: TOTAL HIP ARTHROPLASTY ANTERIOR APPROACH;  Surgeon: Rod Can, MD;  Location: [facility];  Service: Orthopedics;  Laterality: Right;    Current Outpatient Medications  Medication Sig Dispense Refill  . acetaminophen (TYLENOL) 500 MG tablet Take 1,000 mg by mouth every 6 (six) hours as needed (for pain.).    Marland Kitchen amiodarone (PACERONE) 200 MG tablet Take 1 tablet (200 mg total) by mouth daily. 90 tablet 3  . apixaban (ELIQUIS) 5 MG TABS tablet Take 1 tablet (5 mg total) by mouth 2 (two) times daily. 60 tablet 10  . atorvastatin (LIPITOR) 40 MG tablet Take 1 tablet (40 mg total) by mouth daily at 6 PM. 90 tablet 3  . citalopram (CELEXA) 20 MG tablet Take 20 mg by mouth daily.    . Coenzyme Q10 (COQ10) 100 MG CAPS Take 100 mg by mouth daily.     Marland Kitchen conjugated estrogens (PREMARIN) vaginal cream Place 1 Applicatorful vaginally daily as needed (dyspareunia (Use as directed)).     . diclofenac Sodium (VOLTAREN) 1 % GEL Apply 2 g topically 4 (four) times daily as needed (ankle pain.).     Marland Kitchen fluticasone (FLONASE) 50 MCG/ACT nasal spray Place 2 sprays into both nostrils 2 (two) times daily.     . furosemide (LASIX) 40 MG tablet Take 1 tablet (40 mg total) by mouth every morning AND 0.5 tablets (20 mg total) every evening. (Patient taking differently: Take 1 tablet (20 mg total) by mouth every morning AND 0.5 tablets (10 mg total) every evening.) 45 tablet 3  . levothyroxine (SYNTHROID) 25 MCG tablet Take 25 mcg by mouth daily before breakfast.    . Magnesium Oxide 500 MG CAPS Take 1,000 mg by mouth 2 (two) times daily.     . meclizine (ANTIVERT) 25 MG tablet Take 1 tablet (25 mg total) by mouth 3 (three) times daily as needed for dizziness. 30 tablet 0  . metoprolol succinate  (TOPROL-XL) 25 MG 24 hr tablet Take 1 tablet (25 mg total) by mouth at bedtime. 90 tablet 3  . potassium chloride SA (KLOR-CON) 20 MEQ tablet Take 1 tablet (20 mEq total) by mouth daily. 90 tablet 3  . spironolactone (ALDACTONE) 25 MG tablet Take 1 tablet (25 mg total) by mouth at bedtime. 90 tablet 3  . zolpidem (AMBIEN) 5 MG tablet Take 5 mg by mouth at bedtime as needed for sleep.     No current facility-administered medications for this visit.    Allergies:   Clindamycin/lincomycin, Dramamine ii [meclizine hcl], Pneumococcal vaccines, and Sulfonamide derivatives   Social History:  The patient  reports that she quit smoking about 41 years ago. Her smoking use included cigarettes. She has a 10.00 pack-year smoking history. She has never used smokeless tobacco. She reports current alcohol use of about 14.0 standard drinks of alcohol per week. She reports that she does not use drugs.   Family History:  The  patient's  family history includes Heart attack in her father; Heart disease in her father; Prostate cancer in her father; Uterine cancer in her mother.   ROS:  Please see the history of present illness.   All other systems are personally reviewed and negative.    Exam:    Vital Signs:  There were no vitals taken for this visit.  no acute distress, no shortness of breath.  Labs/Other Tests and Data Reviewed:    Recent Labs: 12/21/2018: Magnesium 2.2 03/01/2019: Hemoglobin 13.0; Platelets 141 03/18/2019: TSH 3.222 05/09/2019: B Natriuretic Peptide 2,241.2 06/28/2019: ALT 46 08/02/2019: BUN 20; Creatinine, Ser 0.96; Potassium 3.9; Sodium 136   Wt Readings from Last 3 Encounters:  07/01/19 126 lb (57.2 kg)  06/28/19 127 lb 9.6 oz (57.9 kg)  06/17/19 126 lb (57.2 kg)     Other studies personally reviewed: Additional studies/ records that were reviewed today include: ECG 07/01/2019 personally reviewed  Review of the above records today demonstrates: AV paced    ASSESSMENT & PLAN:     1.  Paroxysmal atrial fibrillation/atrial tachycardia: Currently on amiodarone and Eliquis.  CHA2DS2-VASc of 3.  Plan for ablation 08/14/2019.  Risks and benefits were discussed include bleeding, tamponade, heart block, stroke, damage surrounding organs.  She understands these risks and is agreed to the procedure.  2.  Chronic slight heart failure due to nonischemic cardiomyopathy: Currently feels well without obvious volume overload.  Status post [employer] CRT-D.   COVID 19 screen The patient denies symptoms of COVID 19 at this time.  The importance of social distancing was discussed today.  Follow-up:  3 months Current medicines are reviewed at length with the patient today.   The patient does not have concerns regarding her medicines.  The following changes were made today:  none  Labs/ tests ordered today include:  No orders of the defined types were placed in this encounter.    Patient Risk:  after full review of this patients clinical status, I feel that they are at moderate risk at this time.  Today, I have spent 8  minutes with the patient with telehealth technology discussing AF .    Signed, Leahmarie Gasiorowski Meredith Leeds, MD  08/06/2019 4:35 PM     [place] [address] [place] [address] [phone] (office) [phone] (fax)

## 2019-08-07 ENCOUNTER — Telehealth (HOSPITAL_COMMUNITY): Payer: Self-pay | Admitting: Emergency Medicine

## 2019-08-07 NOTE — Telephone Encounter (Signed)
Reaching out to patient to offer assistance regarding upcoming cardiac imaging study; pt verbalizes understanding of appt date/time, parking situation and where to check in, pre-test NPO status and medications ordered, and verified current allergies; name and call back number provided for further questions should they arise Gabe Glace RN Navigator Cardiac Imaging [facility] [phone] office [phone] cell 

## 2019-08-08 ENCOUNTER — Ambulatory Visit (HOSPITAL_COMMUNITY)
Admission: RE | Admit: 2019-08-08 | Discharge: 2019-08-08 | Disposition: A | Discharge status: Home / Self Care | Payer: Medicare Other | Source: Ambulatory Visit | Attending: Cardiology | Admitting: Cardiology

## 2019-08-08 ENCOUNTER — Other Ambulatory Visit: Payer: Self-pay

## 2019-08-08 DIAGNOSIS — I4891 Unspecified atrial fibrillation: Secondary | ICD-10-CM | POA: Diagnosis not present

## 2019-08-08 MED ORDER — IOHEXOL 300 MG/ML  SOLN
100.0000 mL | Freq: Once | INTRAMUSCULAR | Status: AC | PRN
  Administered 2019-08-08: 100 mL via INTRAVENOUS

## 2019-08-12 ENCOUNTER — Other Ambulatory Visit (HOSPITAL_COMMUNITY)
Admission: RE | Admit: 2019-08-12 | Discharge: 2019-08-12 | Disposition: A | Discharge status: Home / Self Care | Payer: Medicare Other | Source: Ambulatory Visit | Attending: Cardiology | Admitting: Cardiology

## 2019-08-12 DIAGNOSIS — Z20822 Contact with and (suspected) exposure to covid-19: Secondary | ICD-10-CM | POA: Diagnosis not present

## 2019-08-12 DIAGNOSIS — Z01812 Encounter for preprocedural laboratory examination: Secondary | ICD-10-CM | POA: Insufficient documentation

## 2019-08-12 LAB — SARS CORONAVIRUS 2 (TAT 6-24 HRS): SARS Coronavirus 2: NEGATIVE

## 2019-08-14 ENCOUNTER — Ambulatory Visit (HOSPITAL_COMMUNITY): Payer: Medicare Other

## 2019-08-14 ENCOUNTER — Ambulatory Visit (HOSPITAL_COMMUNITY): Payer: Medicare Other | Admitting: Certified Registered Nurse Anesthetist

## 2019-08-14 ENCOUNTER — Other Ambulatory Visit: Payer: Self-pay

## 2019-08-14 ENCOUNTER — Encounter (HOSPITAL_COMMUNITY)
Admission: RE | Disposition: A | Discharge status: Home / Self Care | Payer: Self-pay | Source: Home / Self Care | Attending: Cardiology

## 2019-08-14 ENCOUNTER — Ambulatory Visit (HOSPITAL_COMMUNITY)
Admission: RE | Admit: 2019-08-14 | Discharge: 2019-08-14 | Disposition: A | Discharge status: Home / Self Care | Payer: Medicare Other | Attending: Cardiology | Admitting: Cardiology

## 2019-08-14 DIAGNOSIS — I2510 Atherosclerotic heart disease of native coronary artery without angina pectoris: Secondary | ICD-10-CM | POA: Diagnosis not present

## 2019-08-14 DIAGNOSIS — Z7901 Long term (current) use of anticoagulants: Secondary | ICD-10-CM | POA: Insufficient documentation

## 2019-08-14 DIAGNOSIS — Z79899 Other long term (current) drug therapy: Secondary | ICD-10-CM | POA: Diagnosis not present

## 2019-08-14 DIAGNOSIS — I428 Other cardiomyopathies: Secondary | ICD-10-CM | POA: Insufficient documentation

## 2019-08-14 DIAGNOSIS — Z882 Allergy status to sulfonamides status: Secondary | ICD-10-CM | POA: Diagnosis not present

## 2019-08-14 DIAGNOSIS — I509 Heart failure, unspecified: Secondary | ICD-10-CM | POA: Insufficient documentation

## 2019-08-14 DIAGNOSIS — I951 Orthostatic hypotension: Secondary | ICD-10-CM | POA: Diagnosis not present

## 2019-08-14 DIAGNOSIS — I110 Hypertensive heart disease with heart failure: Secondary | ICD-10-CM | POA: Diagnosis not present

## 2019-08-14 DIAGNOSIS — Z96641 Presence of right artificial hip joint: Secondary | ICD-10-CM | POA: Insufficient documentation

## 2019-08-14 DIAGNOSIS — K219 Gastro-esophageal reflux disease without esophagitis: Secondary | ICD-10-CM | POA: Diagnosis not present

## 2019-08-14 DIAGNOSIS — Z887 Allergy status to serum and vaccine status: Secondary | ICD-10-CM | POA: Insufficient documentation

## 2019-08-14 DIAGNOSIS — E785 Hyperlipidemia, unspecified: Secondary | ICD-10-CM | POA: Diagnosis not present

## 2019-08-14 DIAGNOSIS — M19072 Primary osteoarthritis, left ankle and foot: Secondary | ICD-10-CM | POA: Diagnosis not present

## 2019-08-14 DIAGNOSIS — E876 Hypokalemia: Secondary | ICD-10-CM | POA: Diagnosis not present

## 2019-08-14 DIAGNOSIS — Z87891 Personal history of nicotine dependence: Secondary | ICD-10-CM | POA: Insufficient documentation

## 2019-08-14 DIAGNOSIS — Z95810 Presence of automatic (implantable) cardiac defibrillator: Secondary | ICD-10-CM | POA: Insufficient documentation

## 2019-08-14 DIAGNOSIS — Z8249 Family history of ischemic heart disease and other diseases of the circulatory system: Secondary | ICD-10-CM | POA: Insufficient documentation

## 2019-08-14 DIAGNOSIS — Z881 Allergy status to other antibiotic agents status: Secondary | ICD-10-CM | POA: Diagnosis not present

## 2019-08-14 DIAGNOSIS — Z888 Allergy status to other drugs, medicaments and biological substances status: Secondary | ICD-10-CM | POA: Diagnosis not present

## 2019-08-14 DIAGNOSIS — I480 Paroxysmal atrial fibrillation: Secondary | ICD-10-CM

## 2019-08-14 DIAGNOSIS — I4891 Unspecified atrial fibrillation: Secondary | ICD-10-CM | POA: Diagnosis not present

## 2019-08-14 HISTORY — PX: TEE WITHOUT CARDIOVERSION: SHX5443

## 2019-08-14 HISTORY — PX: ATRIAL FIBRILLATION ABLATION: EP1191

## 2019-08-14 LAB — CBC
HCT: 42.1 % (ref 36.0–46.0)
Hemoglobin: 13.8 g/dL (ref 12.0–15.0)
MCH: 29.2 pg (ref 26.0–34.0)
MCHC: 32.8 g/dL (ref 30.0–36.0)
MCV: 89.2 fL (ref 80.0–100.0)
Platelets: 120 10*3/uL — ABNORMAL LOW (ref 150–400)
RBC: 4.72 MIL/uL (ref 3.87–5.11)
RDW: 17.5 % — ABNORMAL HIGH (ref 11.5–15.5)
WBC: 5.6 10*3/uL (ref 4.0–10.5)
nRBC: 0 % (ref 0.0–0.2)

## 2019-08-14 LAB — ECHO TEE
Height: 65 in
Weight: 2000 oz

## 2019-08-14 LAB — POCT ACTIVATED CLOTTING TIME: Activated Clotting Time: 400 seconds

## 2019-08-14 SURGERY — ATRIAL FIBRILLATION ABLATION
Anesthesia: General

## 2019-08-14 MED ORDER — PROPOFOL 10 MG/ML IV BOLUS
INTRAVENOUS | Status: DC | PRN
  Administered 2019-08-14: 20 mg via INTRAVENOUS
  Administered 2019-08-14: 100 mg via INTRAVENOUS

## 2019-08-14 MED ORDER — ONDANSETRON HCL 4 MG/2ML IJ SOLN: 4.0000 mg | Freq: Four times a day (QID) | INTRAMUSCULAR | Status: DC | PRN

## 2019-08-14 MED ORDER — ROCURONIUM BROMIDE 50 MG/5ML IV SOSY
PREFILLED_SYRINGE | INTRAVENOUS | Status: DC | PRN
  Administered 2019-08-14: 50 mg via INTRAVENOUS

## 2019-08-14 MED ORDER — CEFAZOLIN SODIUM-DEXTROSE 2-4 GM/100ML-% IV SOLN
INTRAVENOUS | Status: AC
  Filled 2019-08-14: qty 100

## 2019-08-14 MED ORDER — HEPARIN SODIUM (PORCINE) 1000 UNIT/ML IJ SOLN
INTRAMUSCULAR | Status: DC | PRN
  Administered 2019-08-14: 13000 [IU] via INTRAVENOUS

## 2019-08-14 MED ORDER — SODIUM CHLORIDE 0.9 % IV SOLN: 250.0000 mL | INTRAVENOUS | Status: DC | PRN

## 2019-08-14 MED ORDER — HEPARIN SODIUM (PORCINE) 1000 UNIT/ML IJ SOLN
INTRAMUSCULAR | Status: DC | PRN
  Administered 2019-08-14: 1000 [IU] via INTRAVENOUS

## 2019-08-14 MED ORDER — PHENYLEPHRINE 40 MCG/ML (10ML) SYRINGE FOR IV PUSH (FOR BLOOD PRESSURE SUPPORT)
PREFILLED_SYRINGE | INTRAVENOUS | Status: DC | PRN
  Administered 2019-08-14: 80 ug via INTRAVENOUS

## 2019-08-14 MED ORDER — PHENYLEPHRINE HCL-NACL 10-0.9 MG/250ML-% IV SOLN
INTRAVENOUS | Status: DC | PRN
  Administered 2019-08-14: 25 ug/min via INTRAVENOUS

## 2019-08-14 MED ORDER — SODIUM CHLORIDE 0.9% FLUSH: 3.0000 mL | Freq: Two times a day (BID) | INTRAVENOUS | Status: DC

## 2019-08-14 MED ORDER — DOBUTAMINE IN D5W 4-5 MG/ML-% IV SOLN
INTRAVENOUS | Status: AC
  Filled 2019-08-14: qty 250

## 2019-08-14 MED ORDER — CEFAZOLIN SODIUM-DEXTROSE 2-3 GM-%(50ML) IV SOLR
INTRAVENOUS | Status: DC | PRN
  Administered 2019-08-14: 2 g via INTRAVENOUS

## 2019-08-14 MED ORDER — DOBUTAMINE IN D5W 4-5 MG/ML-% IV SOLN
INTRAVENOUS | Status: DC | PRN
  Administered 2019-08-14: 20 ug/kg/min via INTRAVENOUS

## 2019-08-14 MED ORDER — SODIUM CHLORIDE 0.9% FLUSH: 3.0000 mL | INTRAVENOUS | Status: DC | PRN

## 2019-08-14 MED ORDER — ONDANSETRON HCL 4 MG/2ML IJ SOLN
INTRAMUSCULAR | Status: DC | PRN
  Administered 2019-08-14: 4 mg via INTRAVENOUS

## 2019-08-14 MED ORDER — SODIUM CHLORIDE 0.9 % IV SOLN
INTRAVENOUS | Status: DC
  Administered 2019-08-14: 12:00:00 50 mL/h via INTRAVENOUS

## 2019-08-14 MED ORDER — LIDOCAINE 2% (20 MG/ML) 5 ML SYRINGE
INTRAMUSCULAR | Status: DC | PRN
  Administered 2019-08-14: 40 mg via INTRAVENOUS

## 2019-08-14 MED ORDER — PROTAMINE SULFATE 10 MG/ML IV SOLN
INTRAVENOUS | Status: DC | PRN
Start: 2019-08-14 — End: 2019-08-14
  Administered 2019-08-14: 50 mg via INTRAVENOUS

## 2019-08-14 MED ORDER — HEPARIN SODIUM (PORCINE) 1000 UNIT/ML IJ SOLN
INTRAMUSCULAR | Status: AC
  Filled 2019-08-14: qty 1

## 2019-08-14 MED ORDER — HEPARIN (PORCINE) IN NACL 1000-0.9 UT/500ML-% IV SOLN
INTRAVENOUS | Status: DC | PRN
  Administered 2019-08-14 (×5): 500 mL

## 2019-08-14 MED ORDER — FENTANYL CITRATE (PF) 250 MCG/5ML IJ SOLN
INTRAMUSCULAR | Status: DC | PRN
  Administered 2019-08-14: 100 ug via INTRAVENOUS

## 2019-08-14 MED ORDER — ACETAMINOPHEN 325 MG PO TABS
650.0000 mg | ORAL_TABLET | ORAL | Status: DC | PRN
  Filled 2019-08-14: qty 2

## 2019-08-14 MED ORDER — DEXAMETHASONE SODIUM PHOSPHATE 10 MG/ML IJ SOLN
INTRAMUSCULAR | Status: DC | PRN
  Administered 2019-08-14: 10 mg via INTRAVENOUS

## 2019-08-14 MED ORDER — FENTANYL CITRATE (PF) 100 MCG/2ML IJ SOLN
INTRAMUSCULAR | Status: AC
  Filled 2019-08-14: qty 2

## 2019-08-14 MED ORDER — HEPARIN (PORCINE) IN NACL 1000-0.9 UT/500ML-% IV SOLN
INTRAVENOUS | Status: AC
  Filled 2019-08-14: qty 2500

## 2019-08-14 MED ORDER — SUGAMMADEX SODIUM 200 MG/2ML IV SOLN
INTRAVENOUS | Status: DC | PRN
  Administered 2019-08-14: 150 mg via INTRAVENOUS

## 2019-08-14 SURGICAL SUPPLY — 23 items
BAG SNAP BAND KOVER 36X36 (MISCELLANEOUS) ×2 IMPLANT
BLANKET WARM UNDERBOD FULL ACC (MISCELLANEOUS) ×2 IMPLANT
CATH MAPPNG PENTARAY F 2-6-2MM (CATHETERS) ×1 IMPLANT
CATH SMTCH THERMOCOOL SF DF (CATHETERS) ×2 IMPLANT
CATH SOUNDSTAR ECO 8FR (CATHETERS) ×2 IMPLANT
CATH WEBSTER BI DIR CS D-F CRV (CATHETERS) IMPLANT
COVER SWIFTLINK CONNECTOR (BAG) ×2 IMPLANT
DEVICE CLOSURE PERCLS PRGLD 6F (VASCULAR PRODUCTS) ×1 IMPLANT
PACK EP LATEX FREE (CUSTOM PROCEDURE TRAY) ×2
PACK EP LF (CUSTOM PROCEDURE TRAY) ×1 IMPLANT
PAD PRO RADIOLUCENT 2001M-C (PAD) ×2 IMPLANT
PATCH CARTO3 (PAD) ×2 IMPLANT
PENTARAY F 2-6-2MM (CATHETERS) ×2
PERCLOSE PROGLIDE 6F (VASCULAR PRODUCTS) ×2
SHEATH BAYLIS SUREFLEX  M 8.5 (SHEATH) ×2
SHEATH BAYLIS SUREFLEX M 8.5 (SHEATH) ×1 IMPLANT
SHEATH BAYLIS TRANSSEPTAL 98CM (NEEDLE) ×2 IMPLANT
SHEATH CARTO VIZIGO SM CVD (SHEATH) ×2 IMPLANT
SHEATH PINNACLE 7F 10CM (SHEATH) IMPLANT
SHEATH PINNACLE 8F 10CM (SHEATH) ×4 IMPLANT
SHEATH PINNACLE 9F 10CM (SHEATH) ×2 IMPLANT
SHEATH PROBE COVER 6X72 (BAG) ×2 IMPLANT
TUBING SMART ABLATE COOLFLOW (TUBING) ×2 IMPLANT

## 2019-08-14 NOTE — Progress Notes (Signed)
Patient and husband was given discharge instructions. Both verbalized understanding. 

## 2019-08-14 NOTE — H&P (Signed)
Cynthia Frank has presented today for surgery, with the diagnosis of atrial fibrillation.  The various methods of treatment have been discussed with the patient and family. After consideration of risks, benefits and other options for treatment, the patient has consented to  Procedure(s): Catheter ablation as a surgical intervention .  Risks include but not limited to bleeding, tamponade, heart block, stroke, damage to surrounding organs, among others. The patient's history has been reviewed, patient examined, no change in status, stable for surgery.  I have reviewed the patient's chart and labs.  Questions were answered to the patient's satisfaction.    Ajai Harville Curt Bears, MD 08/14/2019 8:06 AM

## 2019-08-14 NOTE — Anesthesia Preprocedure Evaluation (Signed)
Anesthesia Evaluation  Patient identified by MRN, date of birth, ID band Patient awake    Reviewed: Allergy & Precautions, NPO status , Patient's Chart, lab work & pertinent test results, reviewed documented beta blocker date and time   History of Anesthesia Complications Negative for: history of anesthetic complications  Airway Mallampati: II  TM Distance: >3 FB Neck ROM: Full    Dental  (+) Dental Advisory Given, Teeth Intact   Pulmonary neg recent URI, former smoker,    breath sounds clear to auscultation       Cardiovascular hypertension, Pt. on home beta blockers + CAD and +CHF  + dysrhythmias Atrial Fibrillation + Cardiac Defibrillator  Rhythm:Regular  1. The left ventricle has mildly reduced systolic function, with an  ejection fraction of 45-50%. The cavity size was normal. Left ventricular  diastolic Doppler parameters are indeterminate. No evidence of left  ventricular regional wall motion  abnormalities.  2. The right ventricle has normal systolic function. The cavity was  normal. There is no increase in right ventricular wall thickness. Right  ventricular systolic pressure is moderately elevated.  3. Left atrial size was mildly dilated.  4. There is mild mitral annular calcification present.  5. Tricuspid valve regurgitation is mild-moderate.  6. The aorta is normal unless otherwise noted.  7. Ventricular pacing noted. No visible A wave on mitral inflow or on  mitral annulus tissue Doppler suggests atrial fibrillation at the time of  the study (or atrial mechanical failure). This limits the accuracy of  diastolic function assessment.  8. When compared to the prior study: 12/16/2015, there is improved left  ventricular systolic function due to improved lateral-septal synchrony by  CRT pacing. Despite this, there is worsened pulmonary artery hypertension.    Neuro/Psych negative neurological ROS  negative  psych ROS   GI/Hepatic Neg liver ROS, GERD  ,  Endo/Other  Hypothyroidism   Renal/GU negative Renal ROS     Musculoskeletal  (+) Arthritis ,   Abdominal   Peds  Hematology negative hematology ROS (+)   Anesthesia Other Findings   Reproductive/Obstetrics                             Anesthesia Physical Anesthesia Plan  ASA: II  Anesthesia Plan: General   Post-op Pain Management:    Induction: Intravenous  PONV Risk Score and Plan: 3 and Ondansetron and Dexamethasone  Airway Management Planned: Oral ETT  Additional Equipment: None  Intra-op Plan:   Post-operative Plan: Extubation in OR  Informed Consent: I have reviewed the patients History and Physical, chart, labs and discussed the procedure including the risks, benefits and alternatives for the proposed anesthesia with the patient or authorized representative who has indicated his/her understanding and acceptance.     Dental advisory given  Plan Discussed with: CRNA and Surgeon  Anesthesia Plan Comments:         Anesthesia Quick Evaluation

## 2019-08-14 NOTE — Progress Notes (Signed)
bp cuff switched to small bp cuff

## 2019-08-14 NOTE — Progress Notes (Signed)
  Echocardiogram Echocardiogram Transesophageal has been performed.  Cynthia Frank 08/14/2019, 9:01 AM

## 2019-08-14 NOTE — Discharge Instructions (Signed)
Post procedure care instructions No driving for 4 days. No lifting over 5 lbs for 1 week. No vigorous or sexual activity for 1 week. You may return to work/your usual activities on 08/21/2019. Keep procedure site clean & dry. If you notice increased pain, swelling, bleeding or pus, call/return!  You may shower, but no soaking baths/hot tubs/pools for 1 week.    Femoral Site Care This sheet gives you information about how to care for yourself after your procedure. Your health care provider may also give you more specific instructions. If you have problems or questions, contact your health care provider. What can I expect after the procedure? After the procedure, it is common to have:  Bruising that usually fades within 1-2 weeks.  Tenderness at the site. Follow these instructions at home: Wound care  Follow instructions from your health care provider about how to take care of your insertion site. Make sure you: ? Wash your hands with soap and water before you change your bandage (dressing). If soap and water are not available, use hand sanitizer. ? Change your dressing as told by your health care provider. ? Leave stitches (sutures), skin glue, or adhesive strips in place. These skin closures may need to stay in place for 2 weeks or longer. If adhesive strip edges start to loosen and curl up, you may trim the loose edges. Do not remove adhesive strips completely unless your health care provider tells you to do that.  Do not take baths, swim, or use a hot tub until your health care provider approves.  You may shower 24-48 hours after the procedure or as told by your health care provider. ? Gently wash the site with plain soap and water. ? Pat the area dry with a clean towel. ? Do not rub the site. This may cause bleeding.  Do not apply powder or lotion to the site. Keep the site clean and dry.  Check your femoral site every day for signs of infection. Check for: ? Redness, swelling, or  pain. ? Fluid or blood. ? Warmth. ? Pus or a bad smell. Activity  For the first 2-3 days after your procedure, or as long as directed: ? Avoid climbing stairs as much as possible. ? Do not squat.  Do not lift anything that is heavier than 10 lb (4.5 kg), or the limit that you are told, until your health care provider says that it is safe.  Rest as directed. ? Avoid sitting for a long time without moving. Get up to take short walks every 1-2 hours.  Do not drive for 24 hours if you were given a medicine to help you relax (sedative). General instructions  Take over-the-counter and prescription medicines only as told by your health care provider.  Keep all follow-up visits as told by your health care provider. This is important. Contact a health care provider if you have:  A fever or chills.  You have redness, swelling, or pain around your insertion site. Get help right away if:  The catheter insertion area swells very fast.  You pass out.  You suddenly start to sweat or your skin gets clammy.  The catheter insertion area is bleeding, and the bleeding does not stop when you hold steady pressure on the area.  The area near or just beyond the catheter insertion site becomes pale, cool, tingly, or numb. These symptoms may represent a serious problem that is an emergency. Do not wait to see if the symptoms will go  away. Get medical help right away. Call your local emergency services (911 in the [place] Do not drive yourself to the hospital. Summary  After the procedure, it is common to have bruising that usually fades within 1-2 weeks.  Check your femoral site every day for signs of infection.  Do not lift anything that is heavier than 10 lb (4.5 kg), or the limit that you are told, until your health care provider says that it is safe. This information is not intended to replace advice given to you by your health care provider. Make sure you discuss any questions you have with  your health care provider. Document Revised: 04/17/2017 Document Reviewed: 04/17/2017 Elsevier Patient Education  2020 [employer].

## 2019-08-14 NOTE — Progress Notes (Signed)
Pt removed from trendelenburg. BP up to 93/51 at this time. Supine in bed at 0 degrees. Continuing to monitor in holding area at this time.

## 2019-08-14 NOTE — Anesthesia Procedure Notes (Signed)
Procedure Name: Intubation Date/Time: 08/14/2019 8:28 AM Performed by: Shirlyn Goltz, CRNA Pre-anesthesia Checklist: Patient identified, Emergency Drugs available, Suction available and Patient being monitored Patient Re-evaluated:Patient Re-evaluated prior to induction Oxygen Delivery Method: Circle system utilized Preoxygenation: Pre-oxygenation with 100% oxygen Induction Type: IV induction Ventilation: Mask ventilation without difficulty Laryngoscope Size: Miller and 2 Grade View: Grade I Tube type: Oral Tube size: 7.0 mm Number of attempts: 1 Airway Equipment and Method: Stylet Placement Confirmation: ETT inserted through vocal cords under direct vision,  positive ETCO2 and breath sounds checked- equal and bilateral Secured at: 20 cm Tube secured with: Tape Dental Injury: Teeth and Oropharynx as per pre-operative assessment

## 2019-08-14 NOTE — Transfer of Care (Signed)
Immediate Anesthesia Transfer of Care Note  Patient: [name]  Procedure(s) Performed: ATRIAL FIBRILLATION ABLATION (N/A ) TRANSESOPHAGEAL ECHOCARDIOGRAM (TEE) (N/A )  Patient Location: Cath Lab  Anesthesia Type:General  Level of Consciousness: awake, alert , oriented and patient cooperative  Airway & Oxygen Therapy: Patient Spontanous Breathing and Patient connected to nasal cannula oxygen  Post-op Assessment: Report given to RN and Post -op Vital signs reviewed and stable  bp 87/55; spo2 95% Post vital signs: Reviewed and stable  Last Vitals:  Vitals Value Taken Time  BP 85/42 08/14/19 1055  Temp    Pulse 120 08/14/19 1057  Resp 15 08/14/19 1057  SpO2 87 % 08/14/19 1057  Vitals shown include unvalidated device data.  Last Pain:  Vitals:   08/14/19 0702  TempSrc:   PainSc: 0-No pain      Patients Stated Pain Goal: 3 ([id number])  Complications: No apparent anesthesia complications

## 2019-08-14 NOTE — Progress Notes (Signed)
Patient blood pressures is low. PA called, will continue monitor.

## 2019-08-14 NOTE — Progress Notes (Signed)
INDICATIONS: pre-ablation for atrial fibrillation.  PROCEDURE:   Informed consent was obtained prior to the procedure. The risks, benefits and alternatives for the procedure were discussed and the patient comprehended these risks.  Risks include, but are not limited to, cough, sore throat, vomiting, nausea, somnolence, esophageal and stomach trauma or perforation, bleeding, low blood pressure, aspiration, pneumonia, infection, trauma to the teeth and death.    The patient was endotracheally intubated and sedated per Anesthesiology before the beginning of the procedure.  The transesophageal probe was inserted in the esophagus and stomach without difficulty and multiple views were obtained.  The patient was kept under observation until the patient left the procedure room.  The patient left the procedure room in stable condition.   Agitated microbubble saline contrast was not administered.  COMPLICATIONS:    There were no immediate complications.  FINDINGS:  Moderate to severely depressed left ventricular systolic function, EF [id number]. Global hypokinesis. Severely dilated left atrium, without thrombus. The left atrial appendage has spontaneous echo contrast and low emptying flow velocity. No significant valvular abnormalities.  RECOMMENDATIONS:     Proceed with planned ablation.  Time Spent Directly with the Patient:  30 minutes   Phinley Schall 08/14/2019, 8:45 AM

## 2019-08-14 NOTE — Progress Notes (Addendum)
Patient with persistent low BP 83/47 last 86/47 by automatic cuff   She feels well.  No CP, palpitations or SOB No site discomfort HR is paced at 60 Skin is warm and dry She is mentating well. No pallor Manual BP by myself is 90/60 R groin is stable  Continue to monitor  Avoid IVF given reduced EF  Tommye Standard, PA-C

## 2019-08-16 NOTE — Anesthesia Postprocedure Evaluation (Signed)
Anesthesia Post Note  Patient: Cynthia Frank  Procedure(s) Performed: ATRIAL FIBRILLATION ABLATION (N/A ) TRANSESOPHAGEAL ECHOCARDIOGRAM (TEE) (N/A )     Patient location during evaluation: PACU Anesthesia Type: General Level of consciousness: awake and alert Pain management: pain level controlled Vital Signs Assessment: post-procedure vital signs reviewed and stable Respiratory status: spontaneous breathing, nonlabored ventilation, respiratory function stable and patient connected to nasal cannula oxygen Cardiovascular status: blood pressure returned to baseline and stable Postop Assessment: no apparent nausea or vomiting Anesthetic complications: no    Last Vitals:  Vitals:   08/14/19 1410 08/14/19 1435  BP: (!) 83/41 (!) 137/95  Pulse: (!) 59 77  Resp: 19 (!) 21  Temp:    SpO2: 91% (!) 83%    Last Pain:  Vitals:   08/15/19 0923  TempSrc:   PainSc: 0-No pain                 Keiandra Sullenger

## 2019-08-20 ENCOUNTER — Other Ambulatory Visit: Payer: Self-pay | Admitting: Internal Medicine

## 2019-08-20 ENCOUNTER — Other Ambulatory Visit (HOSPITAL_COMMUNITY): Payer: Self-pay

## 2019-08-21 ENCOUNTER — Other Ambulatory Visit: Payer: Self-pay

## 2019-08-21 ENCOUNTER — Encounter (HOSPITAL_COMMUNITY): Payer: Self-pay | Admitting: Cardiology

## 2019-08-21 ENCOUNTER — Ambulatory Visit (HOSPITAL_COMMUNITY)
Admission: RE | Admit: 2019-08-21 | Discharge: 2019-08-21 | Disposition: A | Discharge status: Home / Self Care | Payer: Medicare Other | Source: Ambulatory Visit | Attending: Cardiology | Admitting: Cardiology

## 2019-08-21 VITALS — BP 104/64 | HR 60 | Wt 127.0 lb

## 2019-08-21 DIAGNOSIS — Z8543 Personal history of malignant neoplasm of ovary: Secondary | ICD-10-CM | POA: Diagnosis not present

## 2019-08-21 DIAGNOSIS — Z7901 Long term (current) use of anticoagulants: Secondary | ICD-10-CM | POA: Diagnosis not present

## 2019-08-21 DIAGNOSIS — I110 Hypertensive heart disease with heart failure: Secondary | ICD-10-CM | POA: Insufficient documentation

## 2019-08-21 DIAGNOSIS — Z882 Allergy status to sulfonamides status: Secondary | ICD-10-CM | POA: Insufficient documentation

## 2019-08-21 DIAGNOSIS — Z8249 Family history of ischemic heart disease and other diseases of the circulatory system: Secondary | ICD-10-CM | POA: Diagnosis not present

## 2019-08-21 DIAGNOSIS — Z87891 Personal history of nicotine dependence: Secondary | ICD-10-CM | POA: Insufficient documentation

## 2019-08-21 DIAGNOSIS — I480 Paroxysmal atrial fibrillation: Secondary | ICD-10-CM | POA: Insufficient documentation

## 2019-08-21 DIAGNOSIS — Z79899 Other long term (current) drug therapy: Secondary | ICD-10-CM | POA: Diagnosis not present

## 2019-08-21 DIAGNOSIS — I428 Other cardiomyopathies: Secondary | ICD-10-CM | POA: Insufficient documentation

## 2019-08-21 DIAGNOSIS — I471 Supraventricular tachycardia: Secondary | ICD-10-CM | POA: Diagnosis not present

## 2019-08-21 DIAGNOSIS — K219 Gastro-esophageal reflux disease without esophagitis: Secondary | ICD-10-CM | POA: Insufficient documentation

## 2019-08-21 DIAGNOSIS — R945 Abnormal results of liver function studies: Secondary | ICD-10-CM | POA: Insufficient documentation

## 2019-08-21 DIAGNOSIS — Z85038 Personal history of other malignant neoplasm of large intestine: Secondary | ICD-10-CM | POA: Insufficient documentation

## 2019-08-21 DIAGNOSIS — Z881 Allergy status to other antibiotic agents status: Secondary | ICD-10-CM | POA: Diagnosis not present

## 2019-08-21 DIAGNOSIS — I5022 Chronic systolic (congestive) heart failure: Secondary | ICD-10-CM

## 2019-08-21 DIAGNOSIS — E039 Hypothyroidism, unspecified: Secondary | ICD-10-CM | POA: Diagnosis not present

## 2019-08-21 LAB — CBC
HCT: 41.4 % (ref 36.0–46.0)
Hemoglobin: 13.1 g/dL (ref 12.0–15.0)
MCH: 29.2 pg (ref 26.0–34.0)
MCHC: 31.6 g/dL (ref 30.0–36.0)
MCV: 92.4 fL (ref 80.0–100.0)
Platelets: 141 10*3/uL — ABNORMAL LOW (ref 150–400)
RBC: 4.48 MIL/uL (ref 3.87–5.11)
RDW: 17.2 % — ABNORMAL HIGH (ref 11.5–15.5)
WBC: 6.1 10*3/uL (ref 4.0–10.5)
nRBC: 0 % (ref 0.0–0.2)

## 2019-08-21 LAB — COMPREHENSIVE METABOLIC PANEL
ALT: 53 U/L — ABNORMAL HIGH (ref 0–44)
AST: 65 U/L — ABNORMAL HIGH (ref 15–41)
Albumin: 3.5 g/dL (ref 3.5–5.0)
Alkaline Phosphatase: 64 U/L (ref 38–126)
Anion gap: 13 (ref 5–15)
BUN: 18 mg/dL (ref 8–23)
CO2: 22 mmol/L (ref 22–32)
Calcium: 8.8 mg/dL — ABNORMAL LOW (ref 8.9–10.3)
Chloride: 97 mmol/L — ABNORMAL LOW (ref 98–111)
Creatinine, Ser: 1.11 mg/dL — ABNORMAL HIGH (ref 0.44–1.00)
GFR calc Af Amer: 54 mL/min — ABNORMAL LOW (ref >60)
GFR calc non Af Amer: 47 mL/min — ABNORMAL LOW (ref >60)
Glucose, Bld: 82 mg/dL (ref 70–99)
Potassium: 4.5 mmol/L (ref 3.5–5.1)
Sodium: 132 mmol/L — ABNORMAL LOW (ref 135–145)
Total Bilirubin: 1.1 mg/dL (ref 0.3–1.2)
Total Protein: 6.9 g/dL (ref 6.5–8.1)

## 2019-08-21 LAB — TSH: TSH: 4.018 u[IU]/mL (ref 0.350–4.500)

## 2019-08-21 NOTE — Patient Instructions (Addendum)
No medication changes today!  Labs today We will only contact you if something comes back abnormal or we need to make some changes. Otherwise no news is good news!  Your physician recommends that you schedule a follow-up appointment in: 3 months with Dr Aundra Dubin   Next appointment: Friday August 6th 2021 at 11:40a  [place]   Please call office at [phone] option 2 if you have any questions or concerns.   At the [facility], you and your health needs are our priority. As part of our continuing mission to provide you with exceptional heart care, we have created designated Provider Care Teams. These Care Teams include your primary Cardiologist (physician) and Advanced Practice Providers (APPs- Physician Assistants and Nurse Practitioners) who all work together to provide you with the care you need, when you need it.   You may see any of the following providers on your designated Care Team at your next follow up: Marland Kitchen Dr Glori Bickers . Dr [name] . Darrick Grinder, NP . Lyda Jester, PA . Audry Riles, PharmD   Please be sure to bring in all your medications bottles to every appointment.

## 2019-08-22 ENCOUNTER — Other Ambulatory Visit (HOSPITAL_COMMUNITY): Payer: Self-pay

## 2019-08-22 MED ORDER — LEVOTHYROXINE SODIUM 25 MCG PO TABS: 25.0000 ug | ORAL_TABLET | Freq: Every day | ORAL | 6 refills | Status: DC

## 2019-08-22 NOTE — Progress Notes (Signed)
Date:  08/22/2019   ID:  [name], DOB [birth year], MRN [id number]  Provider location: [facility] Type of Visit: Established patient   PCP:  [name], MD  Cardiologist:  Dr. Aundra Dubin   History of Present Illness: Cynthia Frank is a 84 y.o. female who has a history of chronic systolic CHF/nonischemic cardiomyopathy, paroxysmal atrial fibrillation, and prior ovarian and colon cancers. She has had a cardiomyopathy known for > 15 years now. Cardiac cath at diagnosis showed mild nonobstructive disease.  EF has been persistently low.  Cause has been thought to be Adriamycin; she received this as part of her ovarian cancer treatment. She has had paroxysmal atrial fibrillation and has remained in NSR with amiodarone use.  No recent atrial fibrillation symptoms, typically feels prolonged palpitations thought to be atrial fibrillation 2-3 times/year.   She had had dyspnea with moderate to heavy exertion for 4-5 years prior to initial appt.  However, just before her initial appt, her symptoms had worsened.  She developed shortness of breath walking short distances on flat ground.  Lasix was increased to 40 mg bid and she lost weight and felt better.     Cardiac MRI was done in 1/17, showing EF 29% with normal RV.  Septal-lateral dyssynchrony was present and there was a non-coronary pattern of LGE in the septum.  LFTs were noted to be mildly elevated.  Amiodarone was decreased to 100 mg daily.   She had Medtronic CRT-D placed in 3/17. Repeat echo 8/17 showed EF 35% with diffuse hypokinesis, normal RV.  Echo in 4/19 showed EF 30-35%, mild AI.   On 11/12/17, she stood up, got lightheaded and passed out briefly with a fall.  Her ICD did not discharge.  She went to the ER and was found to have right C5 transverse process fracture.  She says that she has had lightheadedness with standing for years, comes and goes.  I decreased her Toprol XL.   In 8/20, she  had an episode of lightheadedness with standing followed by syncope and fall with right hip fracture.  She was admitted, had repair of right hip.  She was noted to be orthostatic and Entresto was stopped.  Echo in 8/20 showed EF 45-50% with normal RV systolic function.  In 1/21, patient was in atrial tachycardia and had been in it for several weeks.  She was volume overloaded.  Lasix was increased and she was brought into the hospital for DCCV, but she had converted to NSR on her own.    In 4/21, she had atrial fibrillation ablation.  TEE done in 4/21 showed EF 30-35% with global HK.   She returns today for followup of CHF.  She is a-paced today. No recent atrial fibrillation on device interrogation. She has "good days and bad days."  Weight stable.  Rare lightheadedness with standing and no further falls.  No dyspnea walking in her house, gets short of breath walking 100-200 feet.  Stable weight. No chest pain.   Medtronic device interrogation: No recent atrial fibrillation.  Thoracic impedance is stable with fluid index < threshold.   ECG (personally reviewed): A-BiV sequential pacing.   Labs (1/17): K 4.2, creatinine 1.14, BNP 855 Labs (3/17): SPEP negative, BNP 341, K 4.5, creatinine 1.27, AST 62, ALT 74, TSH normal with low free T3 and normal free T4, HCT 46.5 Labs (4/17): AST 64, ALT 67, BNP 278, K 3.6, creatinine 1.21 Labs (5/17): K 3.4, creatinine 1.39,  AST 53, ALT 61, HBV/HCV negative, TSH mildly elevated, BNP 306 Labs (6/17): K 4.4, creatinine 1.36, free T4 normal, free T3 mildly decreased, AST 50, ALT 53. Labs (7/17): ANA negative, ASMA positive Labs (8/17): AST 45, ALT 37, K 3.8, creatinine 1.19, TSH normal Labs (2/18): TSH normal, LDL 47, LFTs, normal Labs (3/18): K normal, creatinine 1.23.  Labs (7/18): K 4, creatinine 1.32, LFTs normal Labs (7/19): K 3.8, creatinine 1.25, hgb 11.6 Labs (8/19): TSH mildly elevated, K 4.2, creatinine 1.22 Labs (10/19): K 4.1, creatinine 1.23,  LFTs normal, TSH mildly elevated with normal free T3 and free T4.  Labs (1/20): K 3.5, creatinine 1.15, TSH elevated, free T3 and free T4 low, AST 63, ALT 51 Labs (5/20): K 3.8, creatinine 1.22, TSH normal, AST 49, ALT 43, tbili 0.9 Labs (11/20): K 4, creatinine 1.15  Labs (12/20): K 4.1, creatinine 1.25, TSH normal, AST 51, ALT 50 Labs (1/21): AST 145, ALT 107, tbili 1.8, TSH normal Labs (2/21): K 3.8, creatinine 1.38 Labs (3/21): AST 50, ALT 46 Labs (4/21): K 3.9, creatinine 0.96  PMH: 1. Chronic systolic CHF: Nonischemic cardiomyopathy, thought to be related to adriamycin that she had for treatment of ovarian cancer.  NICM diagnosed ~ 2001.  - LHC ~ 2001 with 30% stenosis D1.   - Echo (2013) EF 25-30%.  - Echo (10/14) with EF 25-30%. - Echo (12/16) with EF 20-25%, mild LV dilation, restrictive diastolic function, moderate MR, severe LAE. - Cardiac MRI (1/17): Moderately dilated LV with EF 29%. Diffuse hypokinesis looking worse in the septal wall. Septal-lateral dyssynchrony. Normal RV size and systolic function.  Moderate MR with tethered posterior leaflet.  Mid-wall LGE in the basal to mid septum. This is not a coronary disease pattern. Could be suggestive of prior myocarditis. - Medtronic CRT-D placed 3/17.  - Echo (8/17): EF 35%, diffuse hypokinesis, normal RV size and systolic function, moderate MR.   - Echo (4/19): EF 30-35%, mild AI.  - Echo (8/20): EF 45-50%, diffuse hypokinesis, normal RV size and systolic function. - TEE (4/21): EF 30%, global hypokinesis.  2. Atrial fibrillation/atrial tachycardia: Paroxysmal.  - 4/21 atrial fibrillation ablation.  3. HTN 4. Ovarian cancer: 1985, s/p surgery. Received Adriamycin-based chemotherapy.  5. Colon cancer: 2007, s/p surgery.  6. PVCs 7. GERD 8. PFTs (8/16) without significant abnormality. 9. Prior syncope 10. Elevated transaminases: Uncertain etiology.  - Abdominal US (12/20): Normal-appearing liver.   Current Outpatient  Medications  Medication Sig Dispense Refill  . acetaminophen (TYLENOL) 500 MG tablet Take 1,000 mg by mouth every 6 (six) hours as needed (for pain.).    Marland Kitchen amiodarone (PACERONE) 200 MG tablet Take 1 tablet (200 mg total) by mouth daily. 90 tablet 3  . apixaban (ELIQUIS) 5 MG TABS tablet Take 1 tablet (5 mg total) by mouth 2 (two) times daily. 60 tablet 10  . atorvastatin (LIPITOR) 40 MG tablet Take 1 tablet (40 mg total) by mouth daily at 6 PM. 90 tablet 3  . citalopram (CELEXA) 20 MG tablet TAKE 1 TABLET(20 MG) BY MOUTH DAILY 90 tablet 1  . Coenzyme Q10 (COQ10) 100 MG CAPS Take 100 mg by mouth daily.     Marland Kitchen conjugated estrogens (PREMARIN) vaginal cream Place 1 Applicatorful vaginally daily as needed (dyspareunia (Use as directed)).     . diclofenac Sodium (VOLTAREN) 1 % GEL Apply 2 g topically 4 (four) times daily as needed (ankle pain.).     Marland Kitchen fluticasone (FLONASE) 50 MCG/ACT nasal spray Place 2 sprays  into both nostrils 2 (two) times daily.     . furosemide (LASIX) 40 MG tablet Take 1 tablet by mouth every morning and 1/2 tablet by mouth every evening    . levothyroxine (SYNTHROID) 25 MCG tablet Take 25 mcg by mouth daily before breakfast.    . Magnesium Oxide 500 MG CAPS Take 1,000 mg by mouth 2 (two) times daily.     . meclizine (ANTIVERT) 25 MG tablet Take 1 tablet (25 mg total) by mouth 3 (three) times daily as needed for dizziness. 30 tablet 0  . metoprolol succinate (TOPROL-XL) 25 MG 24 hr tablet Take 1 tablet (25 mg total) by mouth at bedtime. 90 tablet 3  . potassium chloride SA (KLOR-CON) 20 MEQ tablet Take 1 tablet (20 mEq total) by mouth daily. 90 tablet 3  . spironolactone (ALDACTONE) 25 MG tablet Take 1 tablet (25 mg total) by mouth at bedtime. 90 tablet 3  . zolpidem (AMBIEN) 5 MG tablet Take 5 mg by mouth at bedtime as needed for sleep.     No current facility-administered medications for this encounter.    Allergies:   Clindamycin/lincomycin, Dramamine ii [meclizine hcl],  Pneumococcal vaccines, and Sulfonamide derivatives   Social History:  The patient  reports that she quit smoking about 41 years ago. Her smoking use included cigarettes. She has a 10.00 pack-year smoking history. She has never used smokeless tobacco. She reports current alcohol use of about 14.0 standard drinks of alcohol per week. She reports that she does not use drugs.   Family History:  The patient's family history includes Heart attack in her father; Heart disease in her father; Prostate cancer in her father; Uterine cancer in her mother.   ROS:  Please see the history of present illness.   All other systems are personally reviewed and negative.   Exam:  BP 104/64   Pulse 60   Wt 57.6 kg (127 lb)   SpO2 97%   BMI 21.13 kg/m  General: NAD Neck: No JVD, no thyromegaly or thyroid nodule.  Lungs: Clear to auscultation bilaterally with normal respiratory effort. CV: Nondisplaced PMI.  Heart regular S1/S2, no S3/S4, no murmur.  No peripheral edema.  No carotid bruit.  Normal pedal pulses.  Abdomen: Soft, nontender, no hepatosplenomegaly, no distention.  Skin: Intact without lesions or rashes.  Neurologic: Alert and oriented x 3.  Psych: Normal affect. Extremities: No clubbing or cyanosis.  HEENT: Normal.   Recent Labs: 12/21/2018: Magnesium 2.2 05/09/2019: B Natriuretic Peptide 2,241.2 08/21/2019: ALT 53; BUN 18; Creatinine, Ser 1.11; Hemoglobin 13.1; Platelets 141; Potassium 4.5; Sodium 132; TSH 4.018  Personally reviewed   Wt Readings from Last 3 Encounters:  08/21/19 57.6 kg (127 lb)  08/14/19 56.7 kg (125 lb)  07/01/19 57.2 kg (126 lb)    ASSESSMENT AND PLAN:  1. Chronic systolic CHF: EF 0000000 with diffuse hypokinesis on 12/16 echo.  Nonischemic cardiomyopathy, probably related to Adriamycin with ovarian cancer treatment.  Cardiac MRI in 1/17 showed EF 29% with normal RV and septal-lateral dyssynchrony.  There was a non-cardiac pattern of LGE in the septum, cannot rule out  prior myocarditis based on this pattern.  She has a Medtronic CRT-D device. Echo 4/19 with EF 30-35%, then echo in 8/20 showed EF up to 45-50%.  TEE in 3/21 showed EF 30-35%.  NYHA class II symptoms currently, she is not volume overloaded by exam or Optivol.  She does better in NSR. BP remains soft, she has had a lot of trouble  with orthostasis and falls so will avoid uptitration of BP active medications for now.  Orthostatic symptoms are improved overall.  - Continue Toprol XL 25 daily.   - Continue spironolactone 25 daily. BMET today.   - She will stay off Entresto given syncope and lightheadedness while taking it.   - Continue Lasix 40 qam/20 qpm.  BMET today.  2. Atrial arrhythmias: Paroxysmal atrial fibrillation and atrial tachycardia. She needs to maintain NSR as she loses BiV pacing when she goes into atrial arrhythmias, triggering CHF exacerbation.  She had atrial fibrillation ablation in 4/21 and is in NSR today.  - Continue amiodarone 200 mg daily.  LFTs have been mildly elevated, recheck LFTs today.  She will need a periodic eye exam as well.  If she remains in NSR 2-3 months post-ablation, would like to stop amiodarone.  - Continue apixaban, CBC today.  3. Hypothyroidism: Suspect related to amiodarone use.   - Continue Levoxyl, recent TSH stable.  4. Elevated LFTs: Abdominal US showed unremarkable liver.  ?Related to amiodarone.  She is on amiodarone at 200 mg daily as she tolerates atrial arrhythmias poorly.   - Check LFTs again today.  - As above, since she has had atrial fibrillation ablation, hopefully we can stop amiodarone in a couple of months.   Recommended follow-up:  3 months  Signed, [name], MD  08/22/2019  [place] [address] [facility] [place] [address] [phone] (office) [phone] (fax)

## 2019-08-26 ENCOUNTER — Other Ambulatory Visit: Payer: Self-pay | Admitting: Internal Medicine

## 2019-08-27 ENCOUNTER — Ambulatory Visit (INDEPENDENT_AMBULATORY_CARE_PROVIDER_SITE_OTHER): Payer: Medicare Other

## 2019-08-27 DIAGNOSIS — I5022 Chronic systolic (congestive) heart failure: Secondary | ICD-10-CM

## 2019-08-27 DIAGNOSIS — Z95810 Presence of automatic (implantable) cardiac defibrillator: Secondary | ICD-10-CM | POA: Diagnosis not present

## 2019-08-27 NOTE — Progress Notes (Signed)
EPIC Encounter for ICM Monitoring  Patient Name: Cynthia Frank is a 84 y.o. female Date: 08/27/2019 Primary Care Physican: [name], MD Electrophysiologist:Taylor Bi-V Pacing:96.8% 4/7/2021Weight: 126lbs  Time in AT/AF  (0.0%)  Spoke with patient and reports feeling well at this time.  Denies fluid symptoms.    Opitvol thoracic impedancenormal.  Prescribed: Furosemide40 mgTake 1 tablet (40 mg total) by mouth every morning AND 0.5 tablets (20 mg total) every evening.  Labs: 06/17/2019 Clement Sayres [id number], [id number] 05/20/2019 Creatinine1.38, BUN19, Potassium3.8, [id number], [id number] 05/09/2019 Creatinine1.52, BUN21, Potassium4.1, Sodium130, [id number] A complete set of results can be found in Results Review.  Recommendations:  No changes and encouraged to call if experiencing any fluid symptoms.  Follow-up plan: ICM clinic phone appointment on6/14/2021. 91 day device clinic remote transmission 10/29/2019.   Copy of ICM check sent to Dr.Taylor.  3 month ICM trend: 08/27/2019    1 Year ICM trend:       Rosalene Billings, RN 08/27/2019 10:12 AM

## 2019-09-11 ENCOUNTER — Other Ambulatory Visit: Payer: Self-pay

## 2019-09-11 ENCOUNTER — Ambulatory Visit (HOSPITAL_COMMUNITY)
Admission: RE | Admit: 2019-09-11 | Discharge: 2019-09-11 | Disposition: A | Discharge status: Home / Self Care | Payer: Medicare Other | Source: Ambulatory Visit | Attending: Nurse Practitioner | Admitting: Nurse Practitioner

## 2019-09-11 VITALS — BP 96/62 | HR 60 | Ht 65.0 in | Wt 127.0 lb

## 2019-09-11 DIAGNOSIS — K219 Gastro-esophageal reflux disease without esophagitis: Secondary | ICD-10-CM | POA: Diagnosis not present

## 2019-09-11 DIAGNOSIS — Z87891 Personal history of nicotine dependence: Secondary | ICD-10-CM | POA: Insufficient documentation

## 2019-09-11 DIAGNOSIS — I509 Heart failure, unspecified: Secondary | ICD-10-CM | POA: Diagnosis not present

## 2019-09-11 DIAGNOSIS — Z882 Allergy status to sulfonamides status: Secondary | ICD-10-CM | POA: Insufficient documentation

## 2019-09-11 DIAGNOSIS — I110 Hypertensive heart disease with heart failure: Secondary | ICD-10-CM | POA: Diagnosis not present

## 2019-09-11 DIAGNOSIS — Z87440 Personal history of urinary (tract) infections: Secondary | ICD-10-CM | POA: Insufficient documentation

## 2019-09-11 DIAGNOSIS — Z85038 Personal history of other malignant neoplasm of large intestine: Secondary | ICD-10-CM | POA: Insufficient documentation

## 2019-09-11 DIAGNOSIS — I480 Paroxysmal atrial fibrillation: Secondary | ICD-10-CM | POA: Diagnosis not present

## 2019-09-11 DIAGNOSIS — I493 Ventricular premature depolarization: Secondary | ICD-10-CM | POA: Insufficient documentation

## 2019-09-11 DIAGNOSIS — Z8543 Personal history of malignant neoplasm of ovary: Secondary | ICD-10-CM | POA: Insufficient documentation

## 2019-09-11 DIAGNOSIS — Z888 Allergy status to other drugs, medicaments and biological substances status: Secondary | ICD-10-CM | POA: Diagnosis not present

## 2019-09-11 DIAGNOSIS — Z79890 Hormone replacement therapy (postmenopausal): Secondary | ICD-10-CM | POA: Diagnosis not present

## 2019-09-11 DIAGNOSIS — Z95810 Presence of automatic (implantable) cardiac defibrillator: Secondary | ICD-10-CM | POA: Insufficient documentation

## 2019-09-11 DIAGNOSIS — D6869 Other thrombophilia: Secondary | ICD-10-CM | POA: Diagnosis not present

## 2019-09-11 DIAGNOSIS — Z79899 Other long term (current) drug therapy: Secondary | ICD-10-CM | POA: Insufficient documentation

## 2019-09-11 DIAGNOSIS — Z881 Allergy status to other antibiotic agents status: Secondary | ICD-10-CM | POA: Diagnosis not present

## 2019-09-11 DIAGNOSIS — Z86010 Personal history of colonic polyps: Secondary | ICD-10-CM | POA: Diagnosis not present

## 2019-09-11 DIAGNOSIS — I428 Other cardiomyopathies: Secondary | ICD-10-CM | POA: Diagnosis not present

## 2019-09-11 DIAGNOSIS — Z887 Allergy status to serum and vaccine status: Secondary | ICD-10-CM | POA: Diagnosis not present

## 2019-09-11 DIAGNOSIS — Z96641 Presence of right artificial hip joint: Secondary | ICD-10-CM | POA: Diagnosis not present

## 2019-09-11 DIAGNOSIS — Z7901 Long term (current) use of anticoagulants: Secondary | ICD-10-CM | POA: Insufficient documentation

## 2019-09-11 DIAGNOSIS — I2510 Atherosclerotic heart disease of native coronary artery without angina pectoris: Secondary | ICD-10-CM | POA: Diagnosis not present

## 2019-09-11 DIAGNOSIS — E785 Hyperlipidemia, unspecified: Secondary | ICD-10-CM | POA: Insufficient documentation

## 2019-09-11 NOTE — Progress Notes (Signed)
Primary Care Physician: [name], MD Referring Physician: Dr. Chelsea Primus Cynthia Frank is a 84 y.o. female with a h/o HTN, CAD, CHF, PAF with recent afib ablation one month ago by Dr. Curt Bears that is here for f/u. She  reports that she is doing well. Has not noted any afib. She continues on amiodarone 200 mg daily and this will probably be stopped at the 3 month f/u with Dr. Curt Bears. No swallowing or groin issues. She  is doing all of her usual activities. She continues on eliquis 5 mg bid for a CHA2DS2VASc score of at least 6. No complaints voiced today.  Today, she denies symptoms of palpitations, chest pain, shortness of breath, orthopnea, PND, lower extremity edema, dizziness, presyncope, syncope, or neurologic sequela. The patient is tolerating medications without difficulties and is otherwise without complaint today.   Past Medical History:  Diagnosis Date  . AICD (automatic cardioverter/defibrillator) present   . Arthritis    "left ankle" (07/14/2015)  . Atrial fibrillation ([place])   . CHF (congestive heart failure) ([place])   . Colon cancer ([place]) 03/2006   T3, N0  . Colon polyps 12/07/2010  . Coronary artery disease    nonobstructive with 30% D1  . GERD (gastroesophageal reflux disease)   . History of ovarian cancer 1985  . Hx: UTI (urinary tract infection)   . Hyperlipidemia   . Hypertension   . Internal hemorrhoid   . Nonischemic cardiomyopathy ([place])    last EF assessment 40% by MUGA  . Partial bowel obstruction ([place])   . PVC (premature ventricular contraction)    Past Surgical History:  Procedure Laterality Date  . ABDOMINAL HYSTERECTOMY  1979  . ANKLE CLOSED REDUCTION  10/16/2011   Procedure: CLOSED REDUCTION ANKLE;  Surgeon: Tobi Bastos, MD;  Location: [facility];  Service: Orthopedics;  Laterality: Left;  . ATRIAL FIBRILLATION ABLATION N/A 08/14/2019   Procedure: ATRIAL FIBRILLATION ABLATION;  Surgeon: Constance Haw, MD;  Location: [facility];  Service: Cardiovascular;  Laterality: N/A;  . COLECTOMY  2007   for colon cancer  . COLONOSCOPY N/A 03/11/2013   Procedure: COLONOSCOPY;  Surgeon: Ladene Artist, MD;  Location: [facility];  Service: Endoscopy;  Laterality: N/A;  . COLONOSCOPY WITH PROPOFOL N/A 04/21/2015   Procedure: COLONOSCOPY WITH PROPOFOL;  Surgeon: Ladene Artist, MD;  Location: [facility];  Service: Endoscopy;  Laterality: N/A;  . COLONOSCOPY WITH PROPOFOL N/A 02/05/2018   Procedure: COLONOSCOPY WITH PROPOFOL Hemostasis Clip;  Surgeon: Ladene Artist, MD;  [facility]: [facility];  Service: Endoscopy;  Laterality: N/A;  . EP IMPLANTABLE DEVICE N/A 07/14/2015   Procedure: BiV ICD Insertion CRT-D;  Surgeon: Evans Lance, MD;  Location: [facility];  Service: Cardiovascular;  Laterality: N/A;  . ESOPHAGOGASTRODUODENOSCOPY (EGD) WITH PROPOFOL N/A 04/21/2015   Procedure: ESOPHAGOGASTRODUODENOSCOPY (EGD) WITH PROPOFOL;  Surgeon: Ladene Artist, MD;  [facility];  Service: Endoscopy;  Laterality: N/A;  . FRACTURE SURGERY    . INSERTION OF ICD Left 07/14/2015  . LAPAROTOMY  1980   "adhesions"  . LAPAROTOMY  1989   laparotomy for takedown of intestinal obstruction secondary to adhesions   . OOPHORECTOMY  1961  . OVARY SURGERY Right 1985   resection of right ovarian cancer  . POLYPECTOMY  02/05/2018   Procedure: POLYPECTOMY;  Surgeon: Ladene Artist, MD;  [facility];  Service: Endoscopy;;  . SMALL INTESTINE SURGERY  1985  . TEE WITHOUT CARDIOVERSION N/A  08/14/2019   Procedure: TRANSESOPHAGEAL ECHOCARDIOGRAM (TEE);  Surgeon: Constance Haw, MD;  Location: [facility];  Service: Cardiovascular;  Laterality: N/A;  . TONSILLECTOMY  1944  . TOTAL HIP ARTHROPLASTY Right 12/18/2018   Procedure: TOTAL HIP ARTHROPLASTY ANTERIOR APPROACH;  Surgeon: Rod Can, MD;  Location: [facility];  Service: Orthopedics;  Laterality: Right;    Current Outpatient Medications  Medication  Sig Dispense Refill  . acetaminophen (TYLENOL) 500 MG tablet Take 1,000 mg by mouth every 6 (six) hours as needed (for pain.).    Marland Kitchen amiodarone (PACERONE) 200 MG tablet Take 1 tablet (200 mg total) by mouth daily. 90 tablet 3  . apixaban (ELIQUIS) 5 MG TABS tablet Take 1 tablet (5 mg total) by mouth 2 (two) times daily. 60 tablet 10  . atorvastatin (LIPITOR) 40 MG tablet Take 1 tablet (40 mg total) by mouth daily at 6 PM. 90 tablet 3  . citalopram (CELEXA) 20 MG tablet TAKE 1 TABLET(20 MG) BY MOUTH DAILY 90 tablet 1  . Coenzyme Q10 (COQ10) 100 MG CAPS Take 100 mg by mouth daily.     Marland Kitchen conjugated estrogens (PREMARIN) vaginal cream Place 1 Applicatorful vaginally daily as needed (dyspareunia (Use as directed)).     . diclofenac Sodium (VOLTAREN) 1 % GEL Apply 2 g topically 4 (four) times daily as needed (ankle pain.).     Marland Kitchen fluticasone (FLONASE) 50 MCG/ACT nasal spray Place 2 sprays into both nostrils 2 (two) times daily.     . furosemide (LASIX) 40 MG tablet Take 1 tablet by mouth every morning and 1/2 tablet by mouth every evening    . levothyroxine (SYNTHROID) 25 MCG tablet Take 1 tablet (25 mcg total) by mouth daily before breakfast. 30 tablet 6  . Magnesium Oxide 500 MG CAPS Take 1,000 mg by mouth 2 (two) times daily.     . meclizine (ANTIVERT) 25 MG tablet Take 1 tablet (25 mg total) by mouth 3 (three) times daily as needed for dizziness. 30 tablet 0  . metoprolol succinate (TOPROL-XL) 25 MG 24 hr tablet Take 1 tablet (25 mg total) by mouth at bedtime. 90 tablet 3  . potassium chloride SA (KLOR-CON) 20 MEQ tablet Take 1 tablet (20 mEq total) by mouth daily. 90 tablet 3  . spironolactone (ALDACTONE) 25 MG tablet Take 1 tablet (25 mg total) by mouth at bedtime. 90 tablet 3  . zolpidem (AMBIEN) 5 MG tablet Take 5 mg by mouth at bedtime as needed for sleep.     No current facility-administered medications for this encounter.    Allergies  Allergen Reactions  . Clindamycin/Lincomycin Itching    . Dramamine Ii [Meclizine Hcl] Other (See Comments)    hyper  . Pneumococcal Vaccines Itching and Swelling    Redness   . Sulfonamide Derivatives Itching and Swelling    Social History   Socioeconomic History  . Marital status: Married    Spouse name: Not on file  . Number of children: 2  . Years of education: Not on file  . Highest education level: Not on file  Occupational History  . Occupation: retired  Tobacco Use  . Smoking status: Former Smoker    Packs/day: 0.50    Years: 20.00    Pack years: 10.00    Types: Cigarettes    Quit date: 04/18/1978    Years since quitting: 41.4  . Smokeless tobacco: Never Used  Substance and Sexual Activity  . Alcohol use: Yes    Alcohol/week: 14.0 standard  drinks    Types: 14 Glasses of wine per week    Comment: has wine before dinner   . Drug use: No  . Sexual activity: Not Currently  Other Topics Concern  . Not on file  Social History Narrative   Patient had never smoked.   Alcohol use- yes   Daily caffeine use- coffee   Illicit drug use- no   Occupation:retired    Social Determinants of Health   Financial Resource Strain:   . Difficulty of Paying Living Expenses:   Food Insecurity:   . Worried About Charity fundraiser in the Last Year:   . Arboriculturist in the Last Year:   Transportation Needs:   . Film/video editor (Medical):   Marland Kitchen Lack of Transportation (Non-Medical):   Physical Activity:   . Days of Exercise per Week:   . Minutes of Exercise per Session:   Stress:   . Feeling of Stress :   Social Connections:   . Frequency of Communication with Friends and Family:   . Frequency of Social Gatherings with Friends and Family:   . Attends Religious Services:   . Active Member of Clubs or Organizations:   . Attends Archivist Meetings:   Marland Kitchen Marital Status:   Intimate Partner Violence:   . Fear of Current or Ex-Partner:   . Emotionally Abused:   Marland Kitchen Physically Abused:   . Sexually Abused:      Family History  Problem Relation Age of Onset  . Uterine cancer Mother   . Prostate cancer Father   . Heart disease Father   . Heart attack Father   . Colon cancer Neg Hx     ROS- All systems are reviewed and negative except as per the HPI above  Physical Exam: Vitals:   09/11/19 1002  BP: 96/62  Pulse: 60  Weight: 57.6 kg  Height: 5\' 5"  (1.651 m)   Wt Readings from Last 3 Encounters:  09/11/19 57.6 kg  08/21/19 57.6 kg  08/14/19 56.7 kg    Labs: Lab Results  Component Value Date   NA 132 (L) 08/21/2019   K 4.5 08/21/2019   CL 97 (L) 08/21/2019   CO2 22 08/21/2019   GLUCOSE 82 08/21/2019   BUN 18 08/21/2019   CREATININE 1.11 (H) 08/21/2019   CALCIUM 8.8 (L) 08/21/2019   PHOS 2.6 09/01/2008   MG 2.2 12/21/2018   Lab Results  Component Value Date   INR 1.9 (H) 06/28/2019   Lab Results  Component Value Date   CHOL 138 05/02/2018   HDL 79 05/02/2018   LDLCALC 41 05/02/2018   TRIG 90 05/02/2018     GEN- The patient is well appearing, alert and oriented x 3 today.   Head- normocephalic, atraumatic Eyes-  Sclera clear, conjunctiva pink Ears- hearing intact Oropharynx- clear Neck- supple, no JVP Lymph- no cervical lymphadenopathy Lungs- Clear to ausculation bilaterally, normal work of breathing Heart- Regular rate and rhythm, no murmurs, rubs or gallops, PMI not laterally displaced GI- soft, NT, ND, + BS Extremities- no clubbing, cyanosis, or edema MS- no significant deformity or atrophy Skin- no rash or lesion Psych- euthymic mood, full affect Neuro- strength and sensation are intact  EKG-AV paced at 60 bpm, PR int 130 ms, qrs int 162 ms, qtc 546 ms    Assessment and Plan: 1. Paroxysmal afib  S/p afib ablation 08/14/19 Doing well without any awareness of arrhythmia Continue amiodarone 200 mg daily Anticipate this will  be stopped at 3 month f/u with Dr. Curt Bears   2. CHA2DS2VASc score of 6 Continue eliquis 5 mg bid  Reminded not to  interrupt for elective procedure during the 3 month healing period  F/u with Dr. Curt Bears 7/29  Geroge Baseman. Granger Chui, [facility] [address] [place], [place] [address] [phone]

## 2019-09-17 ENCOUNTER — Encounter: Payer: Self-pay | Admitting: Internal Medicine

## 2019-09-17 ENCOUNTER — Ambulatory Visit (INDEPENDENT_AMBULATORY_CARE_PROVIDER_SITE_OTHER): Payer: Medicare Other | Admitting: Internal Medicine

## 2019-09-17 DIAGNOSIS — R059 Cough, unspecified: Secondary | ICD-10-CM

## 2019-09-17 DIAGNOSIS — D485 Neoplasm of uncertain behavior of skin: Secondary | ICD-10-CM | POA: Diagnosis not present

## 2019-09-17 DIAGNOSIS — L853 Xerosis cutis: Secondary | ICD-10-CM | POA: Diagnosis not present

## 2019-09-17 DIAGNOSIS — C44311 Basal cell carcinoma of skin of nose: Secondary | ICD-10-CM | POA: Diagnosis not present

## 2019-09-17 DIAGNOSIS — R05 Cough: Secondary | ICD-10-CM | POA: Diagnosis not present

## 2019-09-17 DIAGNOSIS — L218 Other seborrheic dermatitis: Secondary | ICD-10-CM | POA: Diagnosis not present

## 2019-09-17 DIAGNOSIS — L821 Other seborrheic keratosis: Secondary | ICD-10-CM | POA: Diagnosis not present

## 2019-09-17 DIAGNOSIS — D1801 Hemangioma of skin and subcutaneous tissue: Secondary | ICD-10-CM | POA: Diagnosis not present

## 2019-09-17 DIAGNOSIS — D2362 Other benign neoplasm of skin of left upper limb, including shoulder: Secondary | ICD-10-CM | POA: Diagnosis not present

## 2019-09-17 MED ORDER — AZITHROMYCIN 250 MG PO TABS
ORAL_TABLET | ORAL | 0 refills | Status: DC
Start: 2019-09-17 — End: 2019-12-12

## 2019-09-17 NOTE — Progress Notes (Signed)
Virtual Visit via Audio Note  I connected with Cynthia Frank on 09/17/19 at 10:40 AM EDT by an audio-only enabled telemedicine application and verified that I am speaking with the correct person using two identifiers.  The patient and the provider were at separate locations throughout the entire encounter. Patient location: home, Provider location: work   I discussed the limitations of evaluation and management by telemedicine and the availability of in person appointments. The patient expressed understanding and agreed to proceed. The patient and the provider were the only parties present for the visit unless noted in HPI below.  History of Present Illness: The patient is a 84 y.o. female with visit for cough and congestion. Started about a week ago. Husband had the same and took z-pack and prednisone. Has CHF and weight is stable. Denies fevers or chills. Denies loss of taste or smell. Has taken covid-19 vaccine. Overall it is not improving. Has tried flonase and this has not helped. Also tried mucinex which did not help at all. Some SOB which is stable from before.   Observations/Objective: voice strong, no coughing or dyspnea during visit  Assessment and Plan: See problem oriented charting  Follow Up Instructions: rx azithromycin  Visit time 11 minutes in non-face to face communication with patient and coordination of care.  I discussed the assessment and treatment plan with the patient. The patient was provided an opportunity to ask questions and all were answered. The patient agreed with the plan and demonstrated an understanding of the instructions.   The patient was advised to call back or seek an in-person evaluation if the symptoms worsen or if the condition fails to improve as anticipated.  [name], MD

## 2019-09-17 NOTE — Assessment & Plan Note (Signed)
Rx azithromycin for likely infection.

## 2019-09-20 ENCOUNTER — Telehealth: Payer: Self-pay | Admitting: Cardiology

## 2019-09-20 DIAGNOSIS — H52203 Unspecified astigmatism, bilateral: Secondary | ICD-10-CM | POA: Diagnosis not present

## 2019-09-20 DIAGNOSIS — H5203 Hypermetropia, bilateral: Secondary | ICD-10-CM | POA: Diagnosis not present

## 2019-09-20 DIAGNOSIS — H2513 Age-related nuclear cataract, bilateral: Secondary | ICD-10-CM | POA: Diagnosis not present

## 2019-09-20 DIAGNOSIS — H1789 Other corneal scars and opacities: Secondary | ICD-10-CM | POA: Diagnosis not present

## 2019-09-20 NOTE — Telephone Encounter (Signed)
I rescheduled the pt appointment 10-11-2019. She will be out of town 09-30-2019.

## 2019-09-20 NOTE — Telephone Encounter (Signed)
  1. Has your device fired? no  2. Is you device beeping? no  3. Are you experiencing draining or swelling at device site? no  4. Are you calling to see if we received your device transmission? no  5. Have you passed out? no  Patient states she will be out of town for her home remote check 09/30/2019. She would like to know if she can send in a transmission before she leaves 09/23/2019.  Please route to [place]

## 2019-10-11 ENCOUNTER — Telehealth: Payer: Self-pay

## 2019-10-11 NOTE — Telephone Encounter (Signed)
Patient called to let us know that her monitor is broke and [employer] is sending her another one once she gets it she will send [place] a transmission so that she can get back on schedule

## 2019-10-11 NOTE — Telephone Encounter (Signed)
Spoke with patient to remind of missed remote transmission 

## 2019-10-14 NOTE — Progress Notes (Signed)
No ICM remote transmission received for 10/11/2019 and next ICM transmission scheduled for 10/30/2019.

## 2019-10-29 ENCOUNTER — Ambulatory Visit (INDEPENDENT_AMBULATORY_CARE_PROVIDER_SITE_OTHER): Payer: Medicare Other | Admitting: *Deleted

## 2019-10-29 DIAGNOSIS — I428 Other cardiomyopathies: Secondary | ICD-10-CM | POA: Diagnosis not present

## 2019-10-29 LAB — CUP PACEART REMOTE DEVICE CHECK
Battery Remaining Longevity: 21 mo
Battery Voltage: 2.93 V
Brady Statistic AP VP Percent: 88.35 %
Brady Statistic AP VS Percent: 0.2 %
Brady Statistic AS VP Percent: 2.59 %
Brady Statistic AS VS Percent: 8.87 %
Brady Statistic RA Percent Paced: 88.38 %
Brady Statistic RV Percent Paced: 90.76 %
Date Time Interrogation Session: 20210713093326
HighPow Impedance: 62 Ohm
Implantable Lead Implant Date: 20170328
Implantable Lead Implant Date: 20170328
Implantable Lead Implant Date: 20170328
Implantable Lead Location: 753858
Implantable Lead Location: 753859
Implantable Lead Location: 753860
Implantable Lead Model: 4598
Implantable Lead Model: 5076
Implantable Lead Model: 6935
Implantable Pulse Generator Implant Date: 20170328
Lead Channel Impedance Value: 304 Ohm
Lead Channel Impedance Value: 342 Ohm
Lead Channel Impedance Value: 361 Ohm
Lead Channel Impedance Value: 399 Ohm
Lead Channel Impedance Value: 399 Ohm
Lead Channel Impedance Value: 456 Ohm
Lead Channel Impedance Value: 475 Ohm
Lead Channel Impedance Value: 475 Ohm
Lead Channel Impedance Value: 608 Ohm
Lead Channel Impedance Value: 608 Ohm
Lead Channel Impedance Value: 703 Ohm
Lead Channel Impedance Value: 722 Ohm
Lead Channel Impedance Value: 760 Ohm
Lead Channel Pacing Threshold Amplitude: 0.875 V
Lead Channel Pacing Threshold Amplitude: 1 V
Lead Channel Pacing Threshold Amplitude: 1.125 V
Lead Channel Pacing Threshold Pulse Width: 0.4 ms
Lead Channel Pacing Threshold Pulse Width: 0.4 ms
Lead Channel Pacing Threshold Pulse Width: 0.4 ms
Lead Channel Sensing Intrinsic Amplitude: 0.375 mV
Lead Channel Sensing Intrinsic Amplitude: 0.375 mV
Lead Channel Sensing Intrinsic Amplitude: 6.375 mV
Lead Channel Sensing Intrinsic Amplitude: 7.625 mV
Lead Channel Setting Pacing Amplitude: 2.25 V
Lead Channel Setting Pacing Amplitude: 2.25 V
Lead Channel Setting Pacing Amplitude: 2.5 V
Lead Channel Setting Pacing Pulse Width: 0.4 ms
Lead Channel Setting Pacing Pulse Width: 0.4 ms
Lead Channel Setting Sensing Sensitivity: 0.3 mV

## 2019-10-30 ENCOUNTER — Ambulatory Visit (INDEPENDENT_AMBULATORY_CARE_PROVIDER_SITE_OTHER): Payer: Medicare Other

## 2019-10-30 DIAGNOSIS — I5022 Chronic systolic (congestive) heart failure: Secondary | ICD-10-CM | POA: Diagnosis not present

## 2019-10-30 DIAGNOSIS — Z95810 Presence of automatic (implantable) cardiac defibrillator: Secondary | ICD-10-CM

## 2019-10-31 NOTE — Progress Notes (Signed)
Remote ICD transmission.   

## 2019-11-01 NOTE — Progress Notes (Signed)
EPIC Encounter for ICM Monitoring  Patient Name: Cynthia Frank is a 84 y.o. female Date: 11/01/2019 Primary Care Physican: [name], MD Primary Cardiologist:[place] Electrophysiologist:[facility] Bi-V Pacing:90.8% 7/16/2021Weight: 126lbs  Time in AT/AF(0.0%)  Spoke with patient and reports feeling well at this time.  Denies fluid symptoms.    Opitvol thoracic impedancenormal.  Prescribed: Furosemide40 mgTake 1 tablet (40 mg total) by mouth every morning AND 0.5 tablets (20 mg total) every evening.  Labs: 08/21/2019 Creatinine 1.11, BUN 18, Potassium 4.5, Sodium 132, GFR 47-54 08/02/2019 Creatinine 0.96, BUN 20, Potassium 3.9, Sodium 136, GFR 56-64  06/17/2019 Creatinine1.21, BUN23, Potassium4.0, Sodium133, [id number] 05/20/2019 Creatinine1.38, BUN19, Potassium3.8, Sodium134, [id number] 05/09/2019 Creatinine1.52, BUN21, Potassium4.1, Sodium130, [id number] A complete set of results can be found in Results Review.  Recommendations:  No changes and encouraged to call if experiencing any fluid symptoms.  Follow-up plan: ICM clinic phone appointment on 12/02/2019.   91 day device clinic remote transmission 01/28/2020.    EP/Cardiology Office Visits: 11/14/2019 with Dr. Curt Bears to discuss ablation and 11/22/2019 with Dr Aundra Dubin.  Recall for 12/10/2019 with Dr Lovena Le  Copy of ICM check sent to Dr. Curt Bears.   3 month ICM trend: 10/29/2019    1 Year ICM trend:       Rosalene Billings, RN 11/01/2019 1:03 PM

## 2019-11-08 DIAGNOSIS — J385 Laryngeal spasm: Secondary | ICD-10-CM | POA: Diagnosis not present

## 2019-11-11 DIAGNOSIS — Z1231 Encounter for screening mammogram for malignant neoplasm of breast: Secondary | ICD-10-CM | POA: Diagnosis not present

## 2019-11-14 ENCOUNTER — Other Ambulatory Visit: Payer: Self-pay

## 2019-11-14 ENCOUNTER — Encounter: Payer: Self-pay | Admitting: Cardiology

## 2019-11-14 ENCOUNTER — Ambulatory Visit (INDEPENDENT_AMBULATORY_CARE_PROVIDER_SITE_OTHER): Payer: Medicare Other | Admitting: Cardiology

## 2019-11-14 VITALS — BP 122/70 | HR 60 | Ht 65.0 in | Wt 128.0 lb

## 2019-11-14 DIAGNOSIS — I5022 Chronic systolic (congestive) heart failure: Secondary | ICD-10-CM | POA: Diagnosis not present

## 2019-11-14 DIAGNOSIS — I428 Other cardiomyopathies: Secondary | ICD-10-CM | POA: Diagnosis not present

## 2019-11-14 DIAGNOSIS — I4819 Other persistent atrial fibrillation: Secondary | ICD-10-CM

## 2019-11-14 LAB — CUP PACEART INCLINIC DEVICE CHECK
Battery Remaining Longevity: 22 mo
Battery Voltage: 2.93 V
Brady Statistic AP VP Percent: 89.76 %
Brady Statistic AP VS Percent: 0.18 %
Brady Statistic AS VP Percent: 2.28 %
Brady Statistic AS VS Percent: 7.79 %
Brady Statistic RA Percent Paced: 89.79 %
Brady Statistic RV Percent Paced: 91.89 %
Date Time Interrogation Session: 20210729111246
HighPow Impedance: 63 Ohm
Implantable Lead Implant Date: 20170328
Implantable Lead Implant Date: 20170328
Implantable Lead Implant Date: 20170328
Implantable Lead Location: 753858
Implantable Lead Location: 753859
Implantable Lead Location: 753860
Implantable Lead Model: 4598
Implantable Lead Model: 5076
Implantable Lead Model: 6935
Implantable Pulse Generator Implant Date: 20170328
Lead Channel Impedance Value: 342 Ohm
Lead Channel Impedance Value: 361 Ohm
Lead Channel Impedance Value: 399 Ohm
Lead Channel Impedance Value: 399 Ohm
Lead Channel Impedance Value: 418 Ohm
Lead Channel Impedance Value: 418 Ohm
Lead Channel Impedance Value: 513 Ohm
Lead Channel Impedance Value: 532 Ohm
Lead Channel Impedance Value: 589 Ohm
Lead Channel Impedance Value: 646 Ohm
Lead Channel Impedance Value: 646 Ohm
Lead Channel Impedance Value: 703 Ohm
Lead Channel Impedance Value: 779 Ohm
Lead Channel Pacing Threshold Amplitude: 0.75 V
Lead Channel Pacing Threshold Amplitude: 1.25 V
Lead Channel Pacing Threshold Amplitude: 1.25 V
Lead Channel Pacing Threshold Pulse Width: 0.4 ms
Lead Channel Pacing Threshold Pulse Width: 0.4 ms
Lead Channel Pacing Threshold Pulse Width: 0.4 ms
Lead Channel Sensing Intrinsic Amplitude: 0.3 mV
Lead Channel Sensing Intrinsic Amplitude: 16.8 mV
Lead Channel Setting Pacing Amplitude: 2.25 V
Lead Channel Setting Pacing Amplitude: 2.25 V
Lead Channel Setting Pacing Amplitude: 2.5 V
Lead Channel Setting Pacing Pulse Width: 0.4 ms
Lead Channel Setting Pacing Pulse Width: 0.4 ms
Lead Channel Setting Sensing Sensitivity: 0.3 mV

## 2019-11-14 NOTE — Progress Notes (Signed)
Electrophysiology Office Note   Date:  11/14/2019   ID:  [name], [name] [birth year], MRN [id number]  PCP:  [name], MD  Cardiologist:  Aundra Dubin Primary Electrophysiologist:  Blayke Cordrey Meredith Leeds, MD    Chief Complaint: CHF   History of Present Illness: Cynthia Frank is a 84 y.o. female who is being seen today for the evaluation of CHF at the request of [name], *. Presenting today for electrophysiology evaluation.  She has a history of chronic systolic heart failure due to nonischemic cardiomyopathy, paroxysmal atrial fibrillation, ovarian and colon cancers.  She has had a cardiomyopathy for approximately 15 years.  The cause was thought due to Adriamycin.  She is currently on amiodarone for her atrial fibrillation.  She has a Medtronic CRT-D implanted 07/06/2015.  Most recent echo shows an improvement in her ejection fraction to 40 to 45%.  He has unfortunately been having an increased burden of atrial arrhythmias.  She is now status post AF ablation 08/14/2019.  Today, denies symptoms of palpitations, chest pain, shortness of breath, orthopnea, PND, lower extremity edema, claudication, dizziness, presyncope, syncope, bleeding, or neurologic sequela. The patient is tolerating medications without difficulties.  Since her ablation she has done well.  She has had no further episodes of atrial fibrillation.  She continues to be active without restriction.   Past Medical History:  Diagnosis Date  . AICD (automatic cardioverter/defibrillator) present   . Arthritis    "left ankle" (07/14/2015)  . Atrial fibrillation ([place])   . CHF (congestive heart failure) ([place])   . Colon cancer ([place]) 03/2006   T3, N0  . Colon polyps 12/07/2010  . Coronary artery disease    nonobstructive with 30% D1  . GERD (gastroesophageal reflux disease)   . History of ovarian cancer 1985  . Hx: UTI (urinary tract infection)   . Hyperlipidemia   . Hypertension   . Internal  hemorrhoid   . Nonischemic cardiomyopathy ([place])    last EF assessment 40% by MUGA  . Partial bowel obstruction ([place])   . PVC (premature ventricular contraction)    Past Surgical History:  Procedure Laterality Date  . ABDOMINAL HYSTERECTOMY  1979  . ANKLE CLOSED REDUCTION  10/16/2011   Procedure: CLOSED REDUCTION ANKLE;  Surgeon: Tobi Bastos, MD;  Location: [facility];  Service: Orthopedics;  Laterality: Left;  . ATRIAL FIBRILLATION ABLATION N/A 08/14/2019   Procedure: ATRIAL FIBRILLATION ABLATION;  Surgeon: Constance Haw, MD;  Location: [facility];  Service: Cardiovascular;  Laterality: N/A;  . COLECTOMY  2007   for colon cancer  . COLONOSCOPY N/A 03/11/2013   Procedure: COLONOSCOPY;  Surgeon: Ladene Artist, MD;  Location: [facility];  Service: Endoscopy;  Laterality: N/A;  . COLONOSCOPY WITH PROPOFOL N/A 04/21/2015   Procedure: COLONOSCOPY WITH PROPOFOL;  Surgeon: Ladene Artist, MD;  Location: [facility];  Service: Endoscopy;  Laterality: N/A;  . COLONOSCOPY WITH PROPOFOL N/A 02/05/2018   Procedure: COLONOSCOPY WITH PROPOFOL Hemostasis Clip;  Surgeon: Ladene Artist, MD;  Location: [facility];  Service: Endoscopy;  Laterality: N/A;  . EP IMPLANTABLE DEVICE N/A 07/14/2015   Procedure: BiV ICD Insertion CRT-D;  Surgeon: Evans Lance, MD;  Location: [facility];  Service: Cardiovascular;  Laterality: N/A;  . ESOPHAGOGASTRODUODENOSCOPY (EGD) WITH PROPOFOL N/A 04/21/2015   Procedure: ESOPHAGOGASTRODUODENOSCOPY (EGD) WITH PROPOFOL;  Surgeon: Ladene Artist, MD;  Location: [facility];  Service: Endoscopy;  Laterality: N/A;  . FRACTURE SURGERY    .  INSERTION OF ICD Left 07/14/2015  . LAPAROTOMY  1980   "adhesions"  . LAPAROTOMY  1989   laparotomy for takedown of intestinal obstruction secondary to adhesions   . OOPHORECTOMY  1961  . OVARY SURGERY Right 1985   resection of right ovarian cancer  . POLYPECTOMY  02/05/2018   Procedure: POLYPECTOMY;  Surgeon:  Ladene Artist, MD;  Location: [facility];  Service: Endoscopy;;  . SMALL INTESTINE SURGERY  1985  . TEE WITHOUT CARDIOVERSION N/A 08/14/2019   Procedure: TRANSESOPHAGEAL ECHOCARDIOGRAM (TEE);  Surgeon: Constance Haw, MD;  Location: [facility];  Service: Cardiovascular;  Laterality: N/A;  . TONSILLECTOMY  1944  . TOTAL HIP ARTHROPLASTY Right 12/18/2018   Procedure: TOTAL HIP ARTHROPLASTY ANTERIOR APPROACH;  Surgeon: Rod Can, MD;  Location: [facility];  Service: Orthopedics;  Laterality: Right;     Current Outpatient Medications  Medication Sig Dispense Refill  . acetaminophen (TYLENOL) 500 MG tablet Take 1,000 mg by mouth every 6 (six) hours as needed (for pain.).    Marland Kitchen apixaban (ELIQUIS) 5 MG TABS tablet Take 1 tablet (5 mg total) by mouth 2 (two) times daily. 60 tablet 10  . atorvastatin (LIPITOR) 40 MG tablet Take 1 tablet (40 mg total) by mouth daily at 6 PM. 90 tablet 3  . azithromycin (ZITHROMAX) 250 MG tablet Day 1 take 2 pills, days 2-5 take 1 pill daily. (Patient taking differently: Day 1 take 2 pills, days 2-5 take 1 pill daily. (Only Prior to dental appt)) 6 tablet 0  . citalopram (CELEXA) 20 MG tablet TAKE 1 TABLET(20 MG) BY MOUTH DAILY 90 tablet 1  . Coenzyme Q10 (COQ10) 100 MG CAPS Take 100 mg by mouth daily.     Marland Kitchen conjugated estrogens (PREMARIN) vaginal cream Place 1 Applicatorful vaginally daily as needed (dyspareunia (Use as directed)).     . diclofenac Sodium (VOLTAREN) 1 % GEL Apply 2 g topically 4 (four) times daily as needed (ankle pain.).     Marland Kitchen fluticasone (FLONASE) 50 MCG/ACT nasal spray Place 2 sprays into both nostrils 2 (two) times daily.     . furosemide (LASIX) 40 MG tablet Take 1 tablet by mouth every morning and 1/2 tablet by mouth every evening    . levothyroxine (SYNTHROID) 25 MCG tablet Take 1 tablet (25 mcg total) by mouth daily before breakfast. 30 tablet 6  . Magnesium Oxide 500 MG CAPS Take 1,000 mg by mouth 2 (two) times daily.     .  meclizine (ANTIVERT) 25 MG tablet Take 1 tablet (25 mg total) by mouth 3 (three) times daily as needed for dizziness. 30 tablet 0  . metoprolol succinate (TOPROL-XL) 25 MG 24 hr tablet Take 1 tablet (25 mg total) by mouth at bedtime. 90 tablet 3  . potassium chloride SA (KLOR-CON) 20 MEQ tablet Take 1 tablet (20 mEq total) by mouth daily. 90 tablet 3  . spironolactone (ALDACTONE) 25 MG tablet Take 1 tablet (25 mg total) by mouth at bedtime. 90 tablet 3  . zolpidem (AMBIEN) 5 MG tablet Take 5 mg by mouth at bedtime as needed for sleep.     No current facility-administered medications for this visit.    Allergies:   Clindamycin/lincomycin, Dramamine ii [meclizine hcl], Pneumococcal vaccines, and Sulfonamide derivatives   Social History:  The patient  reports that she quit smoking about 41 years ago. Her smoking use included cigarettes. She has a 10.00 pack-year smoking history. She has never used smokeless tobacco. She reports current alcohol  use of about 14.0 standard drinks of alcohol per week. She reports that she does not use drugs.   Family History:  The patient's family history includes Heart attack in her father; Heart disease in her father; Prostate cancer in her father; Uterine cancer in her mother.    ROS:  Please see the history of present illness.   Otherwise, review of systems is positive for none.   All other systems are reviewed and negative.   PHYSICAL EXAM: VS:  BP 122/70   Pulse 60   Ht 5\' 5"  (1.651 m)   Wt 128 lb (58.1 kg)   SpO2 97%   BMI 21.30 kg/m  , BMI Body mass index is 21.3 kg/m. GEN: Well nourished, well developed, in no acute distress  HEENT: normal  Neck: no JVD, carotid bruits, or masses Cardiac: RRR; no murmurs, rubs, or gallops,no edema  Respiratory:  clear to auscultation bilaterally, normal work of breathing GI: soft, nontender, nondistended, + BS MS: no deformity or atrophy  Skin: warm and dry, device site well healed Neuro:  Strength and sensation  are intact Psych: euthymic mood, full affect  EKG:  EKG is ordered today. Personal review of the ekg ordered shows AV paced  Personal review of the device interrogation today. Results in [place]: 12/21/2018: Magnesium 2.2 05/09/2019: B Natriuretic Peptide 2,241.2 08/21/2019: ALT 53; BUN 18; Creatinine, Ser 1.11; Hemoglobin 13.1; Platelets 141; Potassium 4.5; Sodium 132; TSH 4.018    Lipid Panel     Component Value Date/Time   CHOL 138 05/02/2018 0946   TRIG 90 05/02/2018 0946   HDL 79 05/02/2018 0946   CHOLHDL 1.7 05/02/2018 0946   VLDL 18 05/02/2018 0946   LDLCALC 41 05/02/2018 0946     Wt Readings from Last 3 Encounters:  11/14/19 128 lb (58.1 kg)  09/11/19 127 lb (57.6 kg)  08/21/19 127 lb (57.6 kg)      Other studies Reviewed: Additional studies/ records that were reviewed today include: TTE 12/16/18  Review of the above records today demonstrates:  1. The left ventricle has mildly reduced systolic function, with an  ejection fraction of 45-50%. The cavity size was normal. Left ventricular  diastolic Doppler parameters are indeterminate. No evidence of left  ventricular regional wall motion  abnormalities.  2. The right ventricle has normal systolic function. The cavity was  normal. There is no increase in right ventricular wall thickness. Right  ventricular systolic pressure is moderately elevated.  3. Left atrial size was mildly dilated.  4. There is mild mitral annular calcification present.  5. Tricuspid valve regurgitation is mild-moderate.  6. The aorta is normal unless otherwise noted.  7. Ventricular pacing noted. No visible A wave on mitral inflow or on  mitral annulus tissue Doppler suggests atrial fibrillation at the time of  the study (or atrial mechanical failure). This limits the accuracy of  diastolic function assessment.  8. When compared to the prior study: 12/16/2015, there is improved left  ventricular systolic function due  to improved lateral-septal synchrony by  CRT pacing. Despite this, there is worsened pulmonary artery hypertension.    ASSESSMENT AND PLAN:  1.  Chronic systolic heart failure due to nonischemic cardiomyopathy: Ejection fraction improved to 45 to 50%.  NYHA class II symptoms.  Currently on Toprol-XL, Aldactone.  Has not tolerated Entresto in the past.  Plan per heart failure team.    2.  Paroxysmal atrial fibrillation/atrial tachycardia: Currently on amiodarone, Eliquis.  CHA2DS2-VASc  of 3.  Is status post AF ablation 08/14/2019.  Fortunately she remains in sinus rhythm.  Due to that, we Lanora Reveron stop her amiodarone today.   Current medicines are reviewed at length with the patient today.   The patient does not have concerns regarding her medicines.  The following changes were made today: Stop amiodarone  Labs/ tests ordered today include:  Orders Placed This Encounter  Procedures  . EKG 12-Lead     Disposition:   FU with Hewitt Garner 3 months  Signed, Akela Pocius Meredith Leeds, MD  11/14/2019 11:12 AM     [facility] [address] [place] [address] [phone] (office) [phone] (fax)

## 2019-11-14 NOTE — Patient Instructions (Addendum)
Medication Instructions:  Your physician has recommended you make the following change in your medication:  STOP Amiodarone   *If you need a refill on your cardiac medications before your next appointment, please call your pharmacy*   Lab Work: None Ordered If you have labs (blood work) drawn today and your tests are completely normal, you will receive your results only by: Marland Kitchen MyChart Message (if you have MyChart) OR . A paper copy in the mail If you have any lab test that is abnormal or we need to change your treatment, we will call you to review the results.   Testing/Procedures: None Ordered   Follow-Up: At [facility], you and your health needs are our priority.  As part of our continuing mission to provide you with exceptional heart care, we have created designated Provider Care Teams.  These Care Teams include your primary Cardiologist (physician) and Advanced Practice Providers (APPs -  Physician Assistants and Nurse Practitioners) who all work together to provide you with the care you need, when you need it.  Your next appointment:   3 month(s) on Tuesday Nov. 9 at 9:30 am  The format for your next appointment:   In Person  Provider:   Dr. Curt Bears

## 2019-11-18 ENCOUNTER — Other Ambulatory Visit: Payer: Self-pay | Admitting: Internal Medicine

## 2019-11-18 DIAGNOSIS — I4819 Other persistent atrial fibrillation: Secondary | ICD-10-CM

## 2019-11-18 NOTE — Telephone Encounter (Signed)
Prescription refill request for Eliquis received. Indication: A fib Last office visit: Scr: 1.62 Age: 84 years old Weight: 58 kg  Patient previously prescribed 5 mg, however has lost weight and now qualifies for 2.5 mg tablets  Attempted to call patient to let her know.  Husband says patient will call back.  Spoke with patient.  Patient voiced understanding

## 2019-11-20 DIAGNOSIS — D485 Neoplasm of uncertain behavior of skin: Secondary | ICD-10-CM | POA: Diagnosis not present

## 2019-11-20 DIAGNOSIS — D2239 Melanocytic nevi of other parts of face: Secondary | ICD-10-CM | POA: Diagnosis not present

## 2019-11-22 ENCOUNTER — Encounter (HOSPITAL_COMMUNITY): Payer: Medicare Other | Admitting: Cardiology

## 2019-12-02 ENCOUNTER — Ambulatory Visit (INDEPENDENT_AMBULATORY_CARE_PROVIDER_SITE_OTHER): Payer: Medicare Other

## 2019-12-02 DIAGNOSIS — I5022 Chronic systolic (congestive) heart failure: Secondary | ICD-10-CM | POA: Diagnosis not present

## 2019-12-02 DIAGNOSIS — Z95810 Presence of automatic (implantable) cardiac defibrillator: Secondary | ICD-10-CM

## 2019-12-04 ENCOUNTER — Telehealth: Payer: Self-pay

## 2019-12-04 NOTE — Telephone Encounter (Signed)
Remote ICM transmission received.  Attempted call to patient regarding ICM remote transmission and person answering phone stated she was not home.   

## 2019-12-04 NOTE — Progress Notes (Signed)
EPIC Encounter for ICM Monitoring  Patient Name: Cynthia Frank is a 84 y.o. female Date: 12/04/2019 Primary Care Physican: [name], MD Primary Cardiologist:[place] Electrophysiologist:Taylor Bi-V Pacing:97.5% 7/16/2021Weight: 126lbs  Time in AT/AF(0.0%)  Attempted call to patient and unable to reach.   Transmission reviewed.   Opitvol thoracic impedancenormal.  Prescribed: Furosemide40 mgTake 1 tablet (40 mg total) by mouth every morning AND 0.5 tablets (20 mg total) every evening.  Labs: 08/21/2019 Creatinine 1.11, BUN 18, Potassium 4.5, Sodium 132, GFR 47-54 08/02/2019 Creatinine 0.96, BUN 20, Potassium 3.9, Sodium 136, GFR 56-64  06/17/2019 Creatinine1.21, BUN23, Potassium4.0, Sodium133, VEZ50-15 05/20/2019 Creatinine1.38, BUN19, Potassium3.8, Sodium134, AEW25-74 05/09/2019 Creatinine1.52, BUN21, Potassium4.1, Sodium130, VTX52-17 A complete set of results can be found in Results Review.  Recommendations: Unable to reach.    Follow-up plan: ICM clinic phone appointment on 01/06/2020.   91 day device clinic remote transmission 01/28/2020.    EP/Cardiology Office Visits: 12/12/2019 with Dr Aundra Dubin. 02/25/2020 with Dr Curt Bears.  Recall for 12/10/2019 with Dr Lovena Le  Copy of ICM check sent to Dr. Lovena Le.    3 month ICM trend: 12/02/2019    1 Year ICM trend:       Rosalene Billings, RN 12/04/2019 2:57 PM

## 2019-12-12 ENCOUNTER — Ambulatory Visit (HOSPITAL_COMMUNITY)
Admission: RE | Admit: 2019-12-12 | Discharge: 2019-12-12 | Disposition: A | Discharge status: Home / Self Care | Payer: Medicare Other | Source: Ambulatory Visit | Attending: Cardiology | Admitting: Cardiology

## 2019-12-12 ENCOUNTER — Other Ambulatory Visit: Payer: Self-pay

## 2019-12-12 VITALS — BP 100/65 | HR 60 | Wt 127.2 lb

## 2019-12-12 DIAGNOSIS — E039 Hypothyroidism, unspecified: Secondary | ICD-10-CM | POA: Insufficient documentation

## 2019-12-12 DIAGNOSIS — I5022 Chronic systolic (congestive) heart failure: Secondary | ICD-10-CM | POA: Diagnosis not present

## 2019-12-12 DIAGNOSIS — Z87891 Personal history of nicotine dependence: Secondary | ICD-10-CM | POA: Diagnosis not present

## 2019-12-12 DIAGNOSIS — K219 Gastro-esophageal reflux disease without esophagitis: Secondary | ICD-10-CM | POA: Insufficient documentation

## 2019-12-12 DIAGNOSIS — I428 Other cardiomyopathies: Secondary | ICD-10-CM | POA: Insufficient documentation

## 2019-12-12 DIAGNOSIS — Z85038 Personal history of other malignant neoplasm of large intestine: Secondary | ICD-10-CM | POA: Insufficient documentation

## 2019-12-12 DIAGNOSIS — E7849 Other hyperlipidemia: Secondary | ICD-10-CM

## 2019-12-12 DIAGNOSIS — Z881 Allergy status to other antibiotic agents status: Secondary | ICD-10-CM | POA: Insufficient documentation

## 2019-12-12 DIAGNOSIS — Z79899 Other long term (current) drug therapy: Secondary | ICD-10-CM | POA: Insufficient documentation

## 2019-12-12 DIAGNOSIS — Z791 Long term (current) use of non-steroidal anti-inflammatories (NSAID): Secondary | ICD-10-CM | POA: Diagnosis not present

## 2019-12-12 DIAGNOSIS — I480 Paroxysmal atrial fibrillation: Secondary | ICD-10-CM | POA: Insufficient documentation

## 2019-12-12 DIAGNOSIS — I471 Supraventricular tachycardia: Secondary | ICD-10-CM | POA: Insufficient documentation

## 2019-12-12 DIAGNOSIS — Z8543 Personal history of malignant neoplasm of ovary: Secondary | ICD-10-CM | POA: Insufficient documentation

## 2019-12-12 DIAGNOSIS — Z7901 Long term (current) use of anticoagulants: Secondary | ICD-10-CM | POA: Diagnosis not present

## 2019-12-12 DIAGNOSIS — Z8249 Family history of ischemic heart disease and other diseases of the circulatory system: Secondary | ICD-10-CM | POA: Diagnosis not present

## 2019-12-12 DIAGNOSIS — I110 Hypertensive heart disease with heart failure: Secondary | ICD-10-CM | POA: Insufficient documentation

## 2019-12-12 DIAGNOSIS — Z882 Allergy status to sulfonamides status: Secondary | ICD-10-CM | POA: Insufficient documentation

## 2019-12-12 DIAGNOSIS — I2510 Atherosclerotic heart disease of native coronary artery without angina pectoris: Secondary | ICD-10-CM | POA: Insufficient documentation

## 2019-12-12 LAB — COMPREHENSIVE METABOLIC PANEL
ALT: 52 U/L — ABNORMAL HIGH (ref 0–44)
AST: 67 U/L — ABNORMAL HIGH (ref 15–41)
Albumin: 3.9 g/dL (ref 3.5–5.0)
Alkaline Phosphatase: 52 U/L (ref 38–126)
Anion gap: 10 (ref 5–15)
BUN: 19 mg/dL (ref 8–23)
CO2: 27 mmol/L (ref 22–32)
Calcium: 9.1 mg/dL (ref 8.9–10.3)
Chloride: 98 mmol/L (ref 98–111)
Creatinine, Ser: 1.11 mg/dL — ABNORMAL HIGH (ref 0.44–1.00)
GFR calc Af Amer: 54 mL/min — ABNORMAL LOW (ref >60)
GFR calc non Af Amer: 47 mL/min — ABNORMAL LOW (ref >60)
Glucose, Bld: 101 mg/dL — ABNORMAL HIGH (ref 70–99)
Potassium: 3.8 mmol/L (ref 3.5–5.1)
Sodium: 135 mmol/L (ref 135–145)
Total Bilirubin: 0.9 mg/dL (ref 0.3–1.2)
Total Protein: 7 g/dL (ref 6.5–8.1)

## 2019-12-12 LAB — LIPID PANEL
Cholesterol: 150 mg/dL (ref 0–200)
HDL: 86 mg/dL (ref >40)
LDL Cholesterol: 52 mg/dL (ref 0–99)
Total CHOL/HDL Ratio: 1.7 RATIO
Triglycerides: 60 mg/dL (ref <150)
VLDL: 12 mg/dL (ref 0–40)

## 2019-12-12 LAB — CBC
HCT: 41.3 % (ref 36.0–46.0)
Hemoglobin: 13.5 g/dL (ref 12.0–15.0)
MCH: 31.5 pg (ref 26.0–34.0)
MCHC: 32.7 g/dL (ref 30.0–36.0)
MCV: 96.5 fL (ref 80.0–100.0)
Platelets: 122 10*3/uL — ABNORMAL LOW (ref 150–400)
RBC: 4.28 MIL/uL (ref 3.87–5.11)
RDW: 14.9 % (ref 11.5–15.5)
WBC: 6.3 10*3/uL (ref 4.0–10.5)
nRBC: 0 % (ref 0.0–0.2)

## 2019-12-12 NOTE — Progress Notes (Signed)
Date:  12/12/2019   ID:  Cynthia Frank, DOB 17-Dec-1935, MRN [id number]  Provider location: [facility] Type of Visit: Established patient   PCP:  [name], MD  Cardiologist:  Dr. Aundra Dubin   History of Present Illness: Cynthia Frank is a 84 y.o. female who has a history of chronic systolic CHF/nonischemic cardiomyopathy, paroxysmal atrial fibrillation, and prior ovarian and colon cancers. She has had a cardiomyopathy known for > 15 years now. Cardiac cath at diagnosis showed mild nonobstructive disease.  EF has been persistently low.  Cause has been thought to be Adriamycin; she received this as part of her ovarian cancer treatment. She has had paroxysmal atrial fibrillation and has remained in NSR with amiodarone use.  No recent atrial fibrillation symptoms, typically feels prolonged palpitations thought to be atrial fibrillation 2-3 times/year.   She had had dyspnea with moderate to heavy exertion for 4-5 years prior to initial appt.  However, just before her initial appt, her symptoms had worsened.  She developed shortness of breath walking short distances on flat ground.  Lasix was increased to 40 mg bid and she lost weight and felt better.     Cardiac MRI was done in 1/17, showing EF 29% with normal RV.  Septal-lateral dyssynchrony was present and there was a non-coronary pattern of LGE in the septum.  LFTs were noted to be mildly elevated.  Amiodarone was decreased to 100 mg daily.   She had Medtronic CRT-D placed in 3/17. Repeat echo 8/17 showed EF 35% with diffuse hypokinesis, normal RV.  Echo in 4/19 showed EF 30-35%, mild AI.   On 11/12/17, she stood up, got lightheaded and passed out briefly with a fall.  Her ICD did not discharge.  She went to the ER and was found to have right C5 transverse process fracture.  She says that she has had lightheadedness with standing for years, comes and goes.  I decreased her Toprol XL.   In 8/20, she  had an episode of lightheadedness with standing followed by syncope and fall with right hip fracture.  She was admitted, had repair of right hip.  She was noted to be orthostatic and Entresto was stopped.  Echo in 8/20 showed EF 45-50% with normal RV systolic function.  In 1/21, patient was in atrial tachycardia and had been in it for several weeks.  She was volume overloaded.  Lasix was increased and she was brought into the hospital for DCCV, but she had converted to NSR on her own.    In 4/21, she had atrial fibrillation ablation.  TEE done in 4/21 showed EF 30-35% with global HK.  She is now off amiodarone.   She returns today for followup of CHF.  She is a-paced today. No recent atrial fibrillation on device interrogation. Mild dyspnea with long walks.  No dyspnea walking up a flight of stairs.  Rare mild lightheadedness with standing, no falls or unsteadiness.  No orthopnea/PND.  No BRBPR/melena.   Medtronic device interrogation: No recent atrial fibrillation.  Thoracic impedance is stable with fluid index < threshold, 92% BiV paced.   ECG (personally reviewed): A-BiV sequential pacing.   Labs (1/17): K 4.2, creatinine 1.14, BNP 855 Labs (3/17): SPEP negative, BNP 341, K 4.5, creatinine 1.27, AST 62, ALT 74, TSH normal with low free T3 and normal free T4, HCT 46.5 Labs (4/17): AST 64, ALT 67, BNP 278, K 3.6, creatinine 1.21 Labs (5/17): K 3.4, creatinine 1.39,  AST 53, ALT 61, HBV/HCV negative, TSH mildly elevated, BNP 306 Labs (6/17): K 4.4, creatinine 1.36, free T4 normal, free T3 mildly decreased, AST 50, ALT 53. Labs (7/17): ANA negative, ASMA positive Labs (8/17): AST 45, ALT 37, K 3.8, creatinine 1.19, TSH normal Labs (2/18): TSH normal, LDL 47, LFTs, normal Labs (3/18): K normal, creatinine 1.23.  Labs (7/18): K 4, creatinine 1.32, LFTs normal Labs (7/19): K 3.8, creatinine 1.25, hgb 11.6 Labs (8/19): TSH mildly elevated, K 4.2, creatinine 1.22 Labs (10/19): K 4.1, creatinine  1.23, LFTs normal, TSH mildly elevated with normal free T3 and free T4.  Labs (1/20): K 3.5, creatinine 1.15, TSH elevated, free T3 and free T4 low, AST 63, ALT 51 Labs (5/20): K 3.8, creatinine 1.22, TSH normal, AST 49, ALT 43, tbili 0.9 Labs (11/20): K 4, creatinine 1.15  Labs (12/20): K 4.1, creatinine 1.25, TSH normal, AST 51, ALT 50 Labs (1/21): AST 145, ALT 107, tbili 1.8, TSH normal Labs (2/21): K 3.8, creatinine 1.38 Labs (3/21): AST 50, ALT 46 Labs (4/21): K 3.9, creatinine 0.96 Labs (5/21): K 4.5, creatinine 1.11, hgb 13.1, plts 141, AST 65, ALT 53  PMH: 1. Chronic systolic CHF: Nonischemic cardiomyopathy, thought to be related to adriamycin that she had for treatment of ovarian cancer.  NICM diagnosed ~ 2001.  - LHC ~ 2001 with 30% stenosis D1.   - Echo (2013) EF 25-30%.  - Echo (10/14) with EF 25-30%. - Echo (12/16) with EF 20-25%, mild LV dilation, restrictive diastolic function, moderate MR, severe LAE. - Cardiac MRI (1/17): Moderately dilated LV with EF 29%. Diffuse hypokinesis looking worse in the septal wall. Septal-lateral dyssynchrony. Normal RV size and systolic function.  Moderate MR with tethered posterior leaflet.  Mid-wall LGE in the basal to mid septum. This is not a coronary disease pattern. Could be suggestive of prior myocarditis. - Medtronic CRT-D placed 3/17.  - Echo (8/17): EF 35%, diffuse hypokinesis, normal RV size and systolic function, moderate MR.   - Echo (4/19): EF 30-35%, mild AI.  - Echo (8/20): EF 45-50%, diffuse hypokinesis, normal RV size and systolic function. - TEE (4/21): EF 30%, global hypokinesis.  2. Atrial fibrillation/atrial tachycardia: Paroxysmal.  - 4/21 atrial fibrillation ablation.  3. HTN 4. Ovarian cancer: 1985, s/p surgery. Received Adriamycin-based chemotherapy.  5. Colon cancer: 2007, s/p surgery.  6. PVCs 7. GERD 8. PFTs (8/16) without significant abnormality. 9. Prior syncope 10. Elevated transaminases: Uncertain  etiology.  - Abdominal US (12/20): Normal-appearing liver.   Current Outpatient Medications  Medication Sig Dispense Refill  . acetaminophen (TYLENOL) 500 MG tablet Take 1,000 mg by mouth every 6 (six) hours as needed (for pain.).    Marland Kitchen apixaban (ELIQUIS) 2.5 MG TABS tablet Take 1 tablet (2.5 mg total) by mouth 2 (two) times daily. 180 tablet 0  . atorvastatin (LIPITOR) 40 MG tablet Take 1 tablet (40 mg total) by mouth daily at 6 PM. 90 tablet 3  . citalopram (CELEXA) 20 MG tablet TAKE 1 TABLET(20 MG) BY MOUTH DAILY 90 tablet 1  . Coenzyme Q10 (COQ10) 100 MG CAPS Take 100 mg by mouth daily.     Marland Kitchen conjugated estrogens (PREMARIN) vaginal cream Place 1 Applicatorful vaginally daily as needed (dyspareunia (Use as directed)).     . diclofenac Sodium (VOLTAREN) 1 % GEL Apply 2 g topically 4 (four) times daily as needed (ankle pain.).     Marland Kitchen fluticasone (FLONASE) 50 MCG/ACT nasal spray Place 2 sprays into both nostrils 2 (two)  times daily.     . furosemide (LASIX) 40 MG tablet Take 1 tablet by mouth every morning and 1/2 tablet by mouth every evening    . levothyroxine (SYNTHROID) 25 MCG tablet Take 1 tablet (25 mcg total) by mouth daily before breakfast. 30 tablet 6  . Magnesium Oxide 500 MG CAPS Take 1,000 mg by mouth 2 (two) times daily.     . metoprolol succinate (TOPROL-XL) 25 MG 24 hr tablet Take 1 tablet (25 mg total) by mouth at bedtime. 90 tablet 3  . potassium chloride SA (KLOR-CON) 20 MEQ tablet Take 1 tablet (20 mEq total) by mouth daily. 90 tablet 3  . spironolactone (ALDACTONE) 25 MG tablet Take 1 tablet (25 mg total) by mouth at bedtime. 90 tablet 3  . zolpidem (AMBIEN) 5 MG tablet Take 5 mg by mouth at bedtime as needed for sleep.     No current facility-administered medications for this encounter.    Allergies:   Clindamycin/lincomycin, Dramamine ii [meclizine hcl], Pneumococcal vaccines, and Sulfonamide derivatives   Social History:  The patient  reports that she quit smoking about  41 years ago. Her smoking use included cigarettes. She has a 10.00 pack-year smoking history. She has never used smokeless tobacco. She reports current alcohol use of about 14.0 standard drinks of alcohol per week. She reports that she does not use drugs.   Family History:  The patient's family history includes Heart attack in her father; Heart disease in her father; Prostate cancer in her father; Uterine cancer in her mother.   ROS:  Please see the history of present illness.   All other systems are personally reviewed and negative.   Exam:  BP 100/65   Pulse 60   Wt 57.7 kg (127 lb 3.2 oz)   SpO2 96%   BMI 21.17 kg/m  General: NAD Neck: No JVD, no thyromegaly or thyroid nodule.  Lungs: Clear to auscultation bilaterally with normal respiratory effort. CV: Nondisplaced PMI.  Heart regular S1/S2, no S3/S4, no murmur.  No peripheral edema.  No carotid bruit.  Normal pedal pulses.  Abdomen: Soft, nontender, no hepatosplenomegaly, no distention.  Skin: Intact without lesions or rashes.  Neurologic: Alert and oriented x 3.  Psych: Normal affect. Extremities: No clubbing or cyanosis.  HEENT: Normal.   Recent Labs: 12/21/2018: Magnesium 2.2 05/09/2019: B Natriuretic Peptide 2,241.2 08/21/2019: TSH 4.018 12/12/2019: ALT 52; BUN 19; Creatinine, Ser 1.11; Hemoglobin 13.5; Platelets 122; Potassium 3.8; Sodium 135  Personally reviewed   Wt Readings from Last 3 Encounters:  12/12/19 57.7 kg (127 lb 3.2 oz)  11/14/19 58.1 kg (128 lb)  09/11/19 57.6 kg (127 lb)    ASSESSMENT AND PLAN:  1. Chronic systolic CHF: EF 38-25% with diffuse hypokinesis on 12/16 echo.  Nonischemic cardiomyopathy, probably related to Adriamycin with ovarian cancer treatment.  Cardiac MRI in 1/17 showed EF 29% with normal RV and septal-lateral dyssynchrony.  There was a non-cardiac pattern of LGE in the septum, cannot rule out prior myocarditis based on this pattern.  She has a Medtronic CRT-D device. Echo 4/19 with EF  30-35%, then echo in 8/20 showed EF up to 45-50%.  TEE in 3/21 showed EF 30-35%.  NYHA class II symptoms currently, she is not volume overloaded by exam or Optivol.  She does better in NSR. BP remains soft, she has had a lot of trouble with orthostasis and falls so will avoid uptitration of BP active medications for now.  Orthostatic symptoms are improved overall. Weight is  stable.  - Continue Toprol XL 25 daily.   - Continue spironolactone 25 daily. BMET today.   - She will stay off Entresto given syncope and lightheadedness while taking it.   - Continue Lasix 40 qam/20 qpm.  BMET today.  - Repeat echo at followup in 3 months.  2. Atrial arrhythmias: Paroxysmal atrial fibrillation and atrial tachycardia. She needs to maintain NSR as she loses BiV pacing when she goes into atrial arrhythmias, triggering CHF exacerbation.  She had atrial fibrillation ablation in 4/21 and is in NSR today.  - She is now off amiodarone => LFTs were elevated mildly on amiodarone, recheck today make sure they are coming down.  - Continue apixaban, CBC today.  3. Hypothyroidism: Suspect related to amiodarone use.   - Continue Levoxyl, recent TSH stable.  4. Elevated LFTs: Abdominal US showed unremarkable liver.  ?Related to amiodarone.  She is now off amiodarone.  - Check LFTs again today.  5. CAD: Calcification noted in coronaries with pulmonary vein CT done prior to afib ablation.  - Continue atorvastatin 40 daily, check lipids today.  - No ASA given apixaban use.   Recommended follow-up:  3 months with echo.   Signed, [name], MD  12/12/2019  [facility] [address] [facility] [place] [address] [phone] (office) [phone] (fax)

## 2019-12-12 NOTE — Patient Instructions (Signed)
Labs done today, your results will be available in MyChart, we will contact you for abnormal readings. ° °Your physician recommends that you schedule a follow-up appointment in: 3 months with echocardiogram ° °If you have any questions or concerns before your next appointment please send us a message through mychart or call our office at [phone].   ° °TO LEAVE A MESSAGE FOR THE NURSE SELECT OPTION 2, PLEASE LEAVE A MESSAGE INCLUDING: °• YOUR NAME °• DATE OF BIRTH °• CALL BACK NUMBER °• REASON FOR CALL**this is important as we prioritize the call backs ° °YOU WILL RECEIVE A CALL BACK THE SAME DAY AS LONG AS YOU CALL BEFORE 4:00 PM ° °At the [facility], you and your health needs are our priority. As part of our continuing mission to provide you with exceptional heart care, we have created designated Provider Care Teams. These Care Teams include your primary Cardiologist (physician) and Advanced Practice Providers (APPs- Physician Assistants and Nurse Practitioners) who all work together to provide you with the care you need, when you need it.  ° °You may see any of the following providers on your designated Care Team at your next follow up: °• Dr Daniel Bensimhon °• Dr Dalton McLean °• Amy Clegg, NP °• Brittainy Simmons, PA °• Lauren Kemp, PharmD ° ° °Please be sure to bring in all your medications bottles to every appointment.  ° ° ° °

## 2019-12-13 ENCOUNTER — Telehealth: Payer: Self-pay | Admitting: Internal Medicine

## 2019-12-13 ENCOUNTER — Other Ambulatory Visit: Payer: Self-pay | Admitting: Internal Medicine

## 2019-12-13 NOTE — Telephone Encounter (Signed)
°*  STAT* If patient is at the pharmacy, call can be transferred to refill team.   1. Which medications need to be refilled? (please list name of each medication and dose if known) atorvastatin (LIPITOR) 40 MG tablet  2. Which pharmacy/location (including street and city if local pharmacy) is medication to be sent to? [place], [place], [place] [address] 3. Do they need a 30 day or 90 day supply? 90 day supply

## 2019-12-18 ENCOUNTER — Encounter (HOSPITAL_COMMUNITY): Payer: Self-pay

## 2019-12-18 ENCOUNTER — Telehealth (HOSPITAL_COMMUNITY): Payer: Self-pay | Admitting: *Deleted

## 2019-12-18 NOTE — Telephone Encounter (Signed)
Pt left vm returning call for lab results. I called back no answer/no vm.

## 2019-12-19 ENCOUNTER — Other Ambulatory Visit (HOSPITAL_COMMUNITY): Payer: Self-pay | Admitting: Adult Health

## 2020-01-01 DIAGNOSIS — Z23 Encounter for immunization: Secondary | ICD-10-CM | POA: Diagnosis not present

## 2020-01-06 ENCOUNTER — Ambulatory Visit (INDEPENDENT_AMBULATORY_CARE_PROVIDER_SITE_OTHER): Payer: Medicare Other

## 2020-01-06 DIAGNOSIS — I5022 Chronic systolic (congestive) heart failure: Secondary | ICD-10-CM | POA: Diagnosis not present

## 2020-01-06 DIAGNOSIS — Z95810 Presence of automatic (implantable) cardiac defibrillator: Secondary | ICD-10-CM

## 2020-01-07 DIAGNOSIS — Z23 Encounter for immunization: Secondary | ICD-10-CM | POA: Diagnosis not present

## 2020-01-10 NOTE — Progress Notes (Signed)
EPIC Encounter for ICM Monitoring  Patient Name: Cynthia Frank is a 84 y.o. female Date: 01/10/2020 Primary Care Physican: [name], MD Primary Cardiologist:McLean Electrophysiologist:Taylor Bi-V Pacing:93.2% 9/24/2021Weight: 126lbs  Time in AT/AF(0.0%)  Spoke with patient and reports feeling well at this time.  Denies fluid symptoms.    Opitvol thoracic impedancenormal.  Prescribed: Furosemide40 mgTake 1 tablet (40 mg total) by mouth every morning AND 0.5 tablets (20 mg total) every evening.  Labs: 08/21/2019 Creatinine1.11Willis Modena, Potassium4.5, Sodium132, PPI95-18 08/02/2019 Creatinine0.96, BUN20, Potassium3.9, Sodium136, [id number] 06/17/2019 Creatinine1.21, BUN23, Potassium4.0, [id number], [id number] 05/20/2019 Creatinine1.38, BUN19, Potassium3.8, [id number], [id number] 05/09/2019 Creatinine1.52, BUN21, Potassium4.1, Sodium130, [id number] A complete set of results can be found in Results Review.  Recommendations: No changes and encouraged to call if experiencing any fluid symptoms.  Follow-up plan: ICM clinic phone appointment on10/25/2021. 91 day device clinic remote transmission 01/28/2020.   EP/Cardiology Office Visits: 03/17/2020 with Dr Aundra Dubin. 02/25/2020 with Dr Curt Bears.Recall for 12/10/2019 with Dr Lovena Le  Copy of ICM check sent to Dr.Taylor.    3 month ICM trend: 01/06/2020    1 Year ICM trend:       Rosalene Billings, RN 01/10/2020 11:30 AM

## 2020-01-22 ENCOUNTER — Ambulatory Visit: Payer: Medicare Other

## 2020-01-28 ENCOUNTER — Ambulatory Visit (INDEPENDENT_AMBULATORY_CARE_PROVIDER_SITE_OTHER): Payer: Medicare Other

## 2020-01-28 DIAGNOSIS — I428 Other cardiomyopathies: Secondary | ICD-10-CM

## 2020-01-28 LAB — CUP PACEART REMOTE DEVICE CHECK
Battery Remaining Longevity: 19 mo
Battery Voltage: 2.91 V
Brady Statistic AP VP Percent: 52.61 %
Brady Statistic AP VS Percent: 0.14 %
Brady Statistic AS VP Percent: 3.79 %
Brady Statistic AS VS Percent: 43.46 %
Brady Statistic RA Percent Paced: 41.96 %
Brady Statistic RV Percent Paced: 44.71 %
Date Time Interrogation Session: 20211012104722
HighPow Impedance: 62 Ohm
Implantable Lead Implant Date: 20170328
Implantable Lead Implant Date: 20170328
Implantable Lead Implant Date: 20170328
Implantable Lead Location: 753858
Implantable Lead Location: 753859
Implantable Lead Location: 753860
Implantable Lead Model: 4598
Implantable Lead Model: 5076
Implantable Lead Model: 6935
Implantable Pulse Generator Implant Date: 20170328
Lead Channel Impedance Value: 285 Ohm
Lead Channel Impedance Value: 342 Ohm
Lead Channel Impedance Value: 342 Ohm
Lead Channel Impedance Value: 342 Ohm
Lead Channel Impedance Value: 418 Ohm
Lead Channel Impedance Value: 418 Ohm
Lead Channel Impedance Value: 475 Ohm
Lead Channel Impedance Value: 532 Ohm
Lead Channel Impedance Value: 532 Ohm
Lead Channel Impedance Value: 589 Ohm
Lead Channel Impedance Value: 703 Ohm
Lead Channel Impedance Value: 703 Ohm
Lead Channel Impedance Value: 722 Ohm
Lead Channel Pacing Threshold Amplitude: 0.75 V
Lead Channel Pacing Threshold Amplitude: 1 V
Lead Channel Pacing Threshold Amplitude: 1.125 V
Lead Channel Pacing Threshold Pulse Width: 0.4 ms
Lead Channel Pacing Threshold Pulse Width: 0.4 ms
Lead Channel Pacing Threshold Pulse Width: 0.4 ms
Lead Channel Sensing Intrinsic Amplitude: 0.375 mV
Lead Channel Sensing Intrinsic Amplitude: 0.375 mV
Lead Channel Sensing Intrinsic Amplitude: 16.75 mV
Lead Channel Sensing Intrinsic Amplitude: 6.375 mV
Lead Channel Setting Pacing Amplitude: 2.25 V
Lead Channel Setting Pacing Amplitude: 2.25 V
Lead Channel Setting Pacing Amplitude: 2.5 V
Lead Channel Setting Pacing Pulse Width: 0.4 ms
Lead Channel Setting Pacing Pulse Width: 0.4 ms
Lead Channel Setting Sensing Sensitivity: 0.3 mV

## 2020-01-29 NOTE — Progress Notes (Signed)
Remote ICD transmission.   

## 2020-02-07 ENCOUNTER — Encounter: Payer: Self-pay | Admitting: Gastroenterology

## 2020-02-07 ENCOUNTER — Telehealth: Payer: Self-pay

## 2020-02-07 ENCOUNTER — Ambulatory Visit (INDEPENDENT_AMBULATORY_CARE_PROVIDER_SITE_OTHER): Payer: Medicare Other | Admitting: *Deleted

## 2020-02-07 ENCOUNTER — Other Ambulatory Visit: Payer: Self-pay

## 2020-02-07 ENCOUNTER — Ambulatory Visit (INDEPENDENT_AMBULATORY_CARE_PROVIDER_SITE_OTHER): Payer: Medicare Other | Admitting: Gastroenterology

## 2020-02-07 VITALS — BP 90/70 | HR 80 | Ht 63.75 in | Wt 129.0 lb

## 2020-02-07 DIAGNOSIS — M810 Age-related osteoporosis without current pathological fracture: Secondary | ICD-10-CM | POA: Diagnosis not present

## 2020-02-07 DIAGNOSIS — Z85038 Personal history of other malignant neoplasm of large intestine: Secondary | ICD-10-CM

## 2020-02-07 DIAGNOSIS — R7401 Elevation of levels of liver transaminase levels: Secondary | ICD-10-CM

## 2020-02-07 DIAGNOSIS — Z7901 Long term (current) use of anticoagulants: Secondary | ICD-10-CM

## 2020-02-07 MED ORDER — DENOSUMAB 60 MG/ML ~~LOC~~ SOSY
60.0000 mg | PREFILLED_SYRINGE | Freq: Once | SUBCUTANEOUS | Status: AC
  Administered 2020-02-07: 60 mg via SUBCUTANEOUS

## 2020-02-07 NOTE — Telephone Encounter (Signed)
[facility] Pre-operative Risk Assessment     Request for surgical clearance:     Endoscopy Procedure  What type of surgery is being performed?     Colonoscopy  When is this surgery scheduled?     TBD (January)  What type of clearance is required ?   Pharmacy  Are there any medications that need to be held prior to surgery and how long? Coumadin x 5 days  Practice name and name of physician performing surgery?      [facility]  What is your office phone and fax number?      Phone- [phone]  [phone]  Anesthesia type (None, local, MAC, general) ?       MAC

## 2020-02-07 NOTE — Progress Notes (Signed)
Pls cosign for prolia in absence of PCP....Cynthia Frank

## 2020-02-07 NOTE — Telephone Encounter (Signed)
Patient with diagnosis of atrial fibrillation on Eliquis for anticoagulation.    Procedure: colonoscopy Date of procedure: January 2022    CHA2DS2-VASc Score = 6  This indicates a 9.7% annual risk of stroke. The patient's score is based upon: CHF History: 1 HTN History: 1 Diabetes History: 0 Stroke History: 0 Vascular Disease History: 1 Age Score: 2 Gender Score: 1  CrCl 36.1 Platelet count 122  Please note that this is more than 60 days out.  We would prefer to make the decision to stop anticoagulation closer to the date of procedure, as circumstances can change in this time.

## 2020-02-07 NOTE — Progress Notes (Signed)
    History of Present Illness: This is an 84 year old female with elevated LFTs and a personal history of colon cancer.  She has a history of CHF EF=20-25%, afib on Coumadin, AICD.  She was treated with amiodarone which was discontinued in March 2021.  She underwent afib ablation in April 2021.  Since discontinuing amiodarone her AST and ALT however they remain in the 50-60 range.  Other LFTs normal. RUQ [place] in December 2020 was unremarkable.  Hepatic serologies remarkable for a weakly positive ANA of 1:40, ASMA Ab mildly elevated at 23 and the other serologies were unremarkable.  She has no gastrointestinal complaints.   Current Medications, Allergies, Past Medical History, Past Surgical History, Family History and Social History were reviewed in [employer] record.   Physical Exam: General: Well developed, well nourished, no acute distress Head: Normocephalic and atraumatic Eyes:  sclerae anicteric, EOMI Ears: Normal auditory acuity Mouth: Not examined, mask on during Covid-19 pandemic Lungs: Clear throughout to auscultation Heart: Regular rate and rhythm; no murmurs, rubs or bruits Abdomen: Soft, non tender and non distended. No masses, hepatosplenomegaly or hernias noted. Normal Bowel sounds Rectal: Deferred to colonoscopy Musculoskeletal: Symmetrical with no gross deformities  Pulses:  Normal pulses noted Extremities: No clubbing, cyanosis, edema or deformities noted Neurological: Alert oriented x 4, grossly nonfocal Psychological:  Alert and cooperative. Normal mood and affect   Assessment and Recommendations:  1.  Persistent mild elevation transaminases, etiology unclear.  Weakly positive ANA and mildly elevated ASMA.  We discussed options of repeat imaging, liver biopsy, hepatology referral or monitoring LFTs for several more months prior to further evaluation.  She is comfortable with the last option which is reasonable.  Repeat LFTs in 3 months.   2.   Personal history of T3N0 colon cancer in 2007.  Multiple recurrent SSAs and TAs.  She is due for 2-year interval surveillance colonoscopy.  Schedule colonoscopy at [facility].  She prefers to proceed in January after the holiday season which is reasonable. The risks (including bleeding, perforation, infection, missed lesions, medication reactions and possible hospitalization or surgery if complications occur), benefits, and alternatives to colonoscopy with possible biopsy and possible polypectomy were discussed with the patient and they consent to proceed.   3.  A. fib maintained on Coumadin. Hold Coumadin 5 days before procedure - will instruct when and how to resume after procedure. Low but real risk of cardiovascular event such as heart attack, stroke, embolism, thrombosis or ischemia/infarct of other organs off Coumadin explained and need to seek urgent help if this occurs. The patient consents to proceed. Will communicate by phone or EMR with patient's prescribing provider to confirm that holding Coumadin is reasonable in this case.

## 2020-02-07 NOTE — Patient Instructions (Signed)
We will contact you when our January hospital schedule becomes available. We are going to get clearance for your coumadin from your Cardiologist before scheduling.   Please come back in 3 months for repeat labs. We will contact you time remind you.   Thank you for choosing me and [facility].  Pricilla Riffle. Dagoberto Ligas., MD., Marval Regal

## 2020-02-07 NOTE — Telephone Encounter (Signed)
Pharmacy, can you please comment on how long anticoagulation can be held prior to surgery? Clearance form mentions Coumadin but looks like he is on Eliquis now.  Thank you!

## 2020-02-10 ENCOUNTER — Ambulatory Visit (INDEPENDENT_AMBULATORY_CARE_PROVIDER_SITE_OTHER): Payer: Medicare Other

## 2020-02-10 DIAGNOSIS — I5022 Chronic systolic (congestive) heart failure: Secondary | ICD-10-CM | POA: Diagnosis not present

## 2020-02-10 DIAGNOSIS — Z95810 Presence of automatic (implantable) cardiac defibrillator: Secondary | ICD-10-CM

## 2020-02-10 NOTE — Telephone Encounter (Signed)
Patient has an appointment with Dr. Curt Bears 02/25/20 which is before the procedure date (colonoscopy scheduled for Jan 2022). Cardiac pre-operative assessment can be addressed at that time. I will route this to Dr. Curt Bears so that he is aware.   Mavery Milling Kathlen Mody, PA-C

## 2020-02-14 ENCOUNTER — Telehealth: Payer: Self-pay

## 2020-02-14 NOTE — Telephone Encounter (Signed)
Remote ICM transmission received.  Attempted call to patient regarding ICM remote transmission and left detailed message per DPR to return call.  Advised to return call for any fluid symptoms or questions.      

## 2020-02-14 NOTE — Progress Notes (Signed)
EPIC Encounter for ICM Monitoring  Patient Name: Cynthia Frank is a 84 y.o. female Date: 02/14/2020 Primary Care Physican: [name], MD Primary Cardiologist:McLean Electrophysiologist:Taylor Bi-V Pacing:97.5% 9/24/2021Weight: 126lbs  Clinical Status (10-Feb-2020 to 10-Feb-2020) AT/AF 3 Time in AT/AF (2.1%) Longest AT/AF  3 minutes  Spoke with husband.  Pt was not home.  He said she is doing fine and will provide report information to her.   Opitvol thoracic impedancenormal.  Prescribed: Furosemide40 mgTake 1 tablet (40 mg total) by mouth every morning AND 0.5 tablets (20 mg total) every evening.  Labs: 08/21/2019 Creatinine1.11Willis Modena, Potassium4.5, Sodium132, [id number] 08/02/2019 Creatinine0.96, BUN20, Potassium3.9, Sodium136, [id number] 06/17/2019 Creatinine1.21, BUN23, Potassium4.0, [id number], [id number] 05/20/2019 Creatinine1.38, BUN19, Potassium3.8, [id number], MSX11-55 05/09/2019 Creatinine1.52, BUN21, Potassium4.1, Sodium130, [id number] A complete set of results can be found in Results Review.  Recommendations:  No changes.    Follow-up plan: ICM clinic phone appointment on11/29/2021. 91 day device clinic remote transmission 04/29/2020.   EP/Cardiology Office Visits: 03/17/2020 with Dr Aundra Dubin.02/25/2020 with Dr Curt Bears.  Copy of ICM check sent to Dr.Taylor.    3 month ICM trend: 02/10/2020    1 Year ICM trend:       Rosalene Billings, RN 02/14/2020 3:25 PM

## 2020-02-15 ENCOUNTER — Other Ambulatory Visit: Payer: Self-pay | Admitting: Cardiology

## 2020-02-15 DIAGNOSIS — I4819 Other persistent atrial fibrillation: Secondary | ICD-10-CM

## 2020-02-16 ENCOUNTER — Other Ambulatory Visit (HOSPITAL_COMMUNITY): Payer: Self-pay | Admitting: Cardiology

## 2020-02-16 ENCOUNTER — Other Ambulatory Visit: Payer: Self-pay | Admitting: Internal Medicine

## 2020-02-17 NOTE — Telephone Encounter (Signed)
Eliquis 2.5mg  refill request received. Patient is 84 years old, weight-58.5kg, Crea-1.11 on 12/12/2019, Diagnosis-Afib, and last seen by Dr. Aundra Dubin on 12/12/2019. Dose is appropriate based on dosing criteria. Will send in refill to requested pharmacy.

## 2020-02-25 ENCOUNTER — Other Ambulatory Visit: Payer: Self-pay

## 2020-02-25 ENCOUNTER — Ambulatory Visit (INDEPENDENT_AMBULATORY_CARE_PROVIDER_SITE_OTHER): Payer: Medicare Other | Admitting: Cardiology

## 2020-02-25 ENCOUNTER — Encounter: Payer: Self-pay | Admitting: Cardiology

## 2020-02-25 VITALS — BP 96/54 | HR 74 | Ht 63.75 in | Wt 130.0 lb

## 2020-02-25 DIAGNOSIS — I480 Paroxysmal atrial fibrillation: Secondary | ICD-10-CM

## 2020-02-25 NOTE — Progress Notes (Signed)
Electrophysiology Office Note   Date:  02/25/2020   ID:  Cynthia, Frank 19-Apr-1935, MRN [id number]  PCP:  [name], MD  Cardiologist:  Aundra Dubin Primary Electrophysiologist:  Carine Nordgren Meredith Leeds, MD    Chief Complaint: CHF   History of Present Illness: Cynthia Frank is a 84 y.o. female who is being seen today for the evaluation of CHF at the request of [name], *. Presenting today for electrophysiology evaluation.  She has a history significant for chronic systolic heart failure due to nonischemic cardiomyopathy, paroxysmal atrial fibrillation, ovarian and colon cancers.  She had cardiomyopathy for approximately 15 years.  This was thought due to Adriamycin.  She is currently on amiodarone for her atrial fibrillation.  She is status post Medtronic CRT-D implanted 07/06/2015.  Her most recent echo shows an improvement in her ejection fraction to 40 to 45%.  She was having an increased burden of atrial fibrillation and is now status post ablation 08/14/2019.  Today, denies symptoms of palpitations, chest pain, shortness of breath, orthopnea, PND, lower extremity edema, claudication, dizziness, presyncope, syncope, bleeding, or neurologic sequela. The patient is tolerating medications without difficulties.  She has been doing well.  She has no chest pain or shortness of breath.  She is able to do all of her daily activities.  She is in atrial flutter today, but is unaware of her arrhythmia.   Past Medical History:  Diagnosis Date  . AICD (automatic cardioverter/defibrillator) present   . Arthritis    "left ankle" (07/14/2015)  . Atrial fibrillation ([place])   . CHF (congestive heart failure) ([place])   . Colon cancer ([place]) 03/2006   T3, N0  . Colon polyps 12/07/2010  . Coronary artery disease    nonobstructive with 30% D1  . GERD (gastroesophageal reflux disease)   . History of ovarian cancer 1985  . Hx: UTI (urinary tract infection)   . Hyperlipidemia    . Hypertension   . Internal hemorrhoid   . Nonischemic cardiomyopathy ([place])    last EF assessment 40% by MUGA  . Partial bowel obstruction ([place])   . PVC (premature ventricular contraction)    Past Surgical History:  Procedure Laterality Date  . ABDOMINAL HYSTERECTOMY  1979  . ANKLE CLOSED REDUCTION  10/16/2011   Procedure: CLOSED REDUCTION ANKLE;  Surgeon: Tobi Bastos, MD;  Location: [facility];  Service: Orthopedics;  Laterality: Left;  . ATRIAL FIBRILLATION ABLATION N/A 08/14/2019   Procedure: ATRIAL FIBRILLATION ABLATION;  Surgeon: Constance Haw, MD;  Location: [facility];  Service: Cardiovascular;  Laterality: N/A;  . COLECTOMY  2007   for colon cancer  . COLONOSCOPY N/A 03/11/2013   Procedure: COLONOSCOPY;  Surgeon: Ladene Artist, MD;  [facility];  Service: Endoscopy;  Laterality: N/A;  . COLONOSCOPY WITH PROPOFOL N/A 04/21/2015   Procedure: COLONOSCOPY WITH PROPOFOL;  Surgeon: Ladene Artist, MD;  [facility];  Service: Endoscopy;  Laterality: N/A;  . COLONOSCOPY WITH PROPOFOL N/A 02/05/2018   Procedure: COLONOSCOPY WITH PROPOFOL Hemostasis Clip;  Surgeon: Ladene Artist, MD;  [facility];  Service: Endoscopy;  Laterality: N/A;  . EP IMPLANTABLE DEVICE N/A 07/14/2015   Procedure: BiV ICD Insertion CRT-D;  Surgeon: Evans Lance, MD;  Location: [facility];  Service: Cardiovascular;  Laterality: N/A;  . ESOPHAGOGASTRODUODENOSCOPY (EGD) WITH PROPOFOL N/A 04/21/2015   Procedure: ESOPHAGOGASTRODUODENOSCOPY (EGD) WITH PROPOFOL;  Surgeon: Ladene Artist, MD;  [facility];  Service: Endoscopy;  Laterality: N/A;  . FRACTURE SURGERY    . INSERTION OF ICD Left 07/14/2015  . LAPAROTOMY  1980   "adhesions"  . LAPAROTOMY  1989   laparotomy for takedown of intestinal obstruction secondary to adhesions   . OOPHORECTOMY  1961  . OVARY SURGERY Right 1985   resection of right ovarian cancer  . POLYPECTOMY  02/05/2018    Procedure: POLYPECTOMY;  Surgeon: Ladene Artist, MD;  [facility];  Service: Endoscopy;;  . SMALL INTESTINE SURGERY  1985  . TEE WITHOUT CARDIOVERSION N/A 08/14/2019   Procedure: TRANSESOPHAGEAL ECHOCARDIOGRAM (TEE);  Surgeon: Constance Haw, MD;  Location: [facility];  Service: Cardiovascular;  Laterality: N/A;  . TONSILLECTOMY  1944  . TOTAL HIP ARTHROPLASTY Right 12/18/2018   Procedure: TOTAL HIP ARTHROPLASTY ANTERIOR APPROACH;  Surgeon: Rod Can, MD;  Location: [facility];  Service: Orthopedics;  Laterality: Right;     Current Outpatient Medications  Medication Sig Dispense Refill  . acetaminophen (TYLENOL) 500 MG tablet Take 1,000 mg by mouth every 6 (six) hours as needed (for pain.).    Marland Kitchen atorvastatin (LIPITOR) 40 MG tablet TAKE 1 TABLET(40 MG) BY MOUTH DAILY AT 6 PM 90 tablet 3  . citalopram (CELEXA) 20 MG tablet TAKE 1 TABLET(20 MG) BY MOUTH DAILY 90 tablet 1  . Coenzyme Q10 (COQ10) 100 MG CAPS Take 100 mg by mouth daily.     Marland Kitchen conjugated estrogens (PREMARIN) vaginal cream Place 1 Applicatorful vaginally daily as needed (dyspareunia (Use as directed)).     . diclofenac Sodium (VOLTAREN) 1 % GEL Apply 2 g topically 4 (four) times daily as needed (ankle pain.).     Marland Kitchen ELIQUIS 2.5 MG TABS tablet TAKE 1 TABLET(2.5 MG) BY MOUTH TWICE DAILY 180 tablet 2  . fluticasone (FLONASE) 50 MCG/ACT nasal spray Place 2 sprays into both nostrils 2 (two) times daily.     . furosemide (LASIX) 40 MG tablet Take 1 tablet by mouth every morning and 1/2 tablet by mouth every evening    . ipratropium (ATROVENT) 0.03 % nasal spray Place 1 spray into both nostrils 3 (three) times daily.    Marland Kitchen ketoconazole (NIZORAL) 2 % shampoo Apply topically as needed.    Marland Kitchen levothyroxine (SYNTHROID) 25 MCG tablet TAKE 1 TABLET(25 MCG) BY MOUTH DAILY BEFORE BREAKFAST 30 tablet 6  . Magnesium Oxide 500 MG CAPS Take 1,000 mg by mouth 2 (two) times daily.     . metoprolol succinate (TOPROL-XL) 25 MG 24  hr tablet Take 1 tablet (25 mg total) by mouth at bedtime. 90 tablet 3  . potassium chloride SA (KLOR-CON) 20 MEQ tablet Take 1 tablet (20 mEq total) by mouth daily. 90 tablet 3  . spironolactone (ALDACTONE) 25 MG tablet Take 1 tablet (25 mg total) by mouth at bedtime. 90 tablet 3  . zolpidem (AMBIEN) 5 MG tablet Take 5 mg by mouth at bedtime as needed for sleep.     No current facility-administered medications for this visit.    Allergies:   Clindamycin/lincomycin, Dramamine ii [meclizine hcl], Pneumococcal vaccines, and Sulfonamide derivatives   Social History:  The patient  reports that she quit smoking about 41 years ago. Her smoking use included cigarettes. She has a 10.00 pack-year smoking history. She has never used smokeless tobacco. She reports current alcohol use of about 14.0 standard drinks of alcohol per week. She reports that she does not use drugs.   Family History:  The patient's family history includes Heart attack in her  father; Heart disease in her father; Prostate cancer in her father; Uterine cancer in her mother.    ROS:  Please see the history of present illness.   Otherwise, review of systems is positive for none.   All other systems are reviewed and negative.   PHYSICAL EXAM: VS:  BP (!) 96/54   Pulse 74   Ht 5' 3.75" (1.619 m)   Wt 130 lb (59 kg)   SpO2 97%   BMI 22.49 kg/m  , BMI Body mass index is 22.49 kg/m. GEN: Well nourished, well developed, in no acute distress  HEENT: normal  Neck: no JVD, carotid bruits, or masses Cardiac: RRR; no murmurs, rubs, or gallops,no edema  Respiratory:  clear to auscultation bilaterally, normal work of breathing GI: soft, nontender, nondistended, + BS MS: no deformity or atrophy  Skin: warm and dry, device site well healed Neuro:  Strength and sensation are intact Psych: euthymic mood, full affect  EKG:  EKG is ordered today. Personal review of the ekg ordered shows atrial flutter, ventricular paced, rate  74  Personal review of the device interrogation today. Results in [facility]: 05/09/2019: B Natriuretic Peptide 2,241.2 08/21/2019: TSH 4.018 12/12/2019: ALT 52; BUN 19; Creatinine, Ser 1.11; Hemoglobin 13.5; Platelets 122; Potassium 3.8; Sodium 135    Lipid Panel     Component Value Date/Time   CHOL 150 12/12/2019 1123   TRIG 60 12/12/2019 1123   HDL 86 12/12/2019 1123   CHOLHDL 1.7 12/12/2019 1123   VLDL 12 12/12/2019 1123   LDLCALC 52 12/12/2019 1123     Wt Readings from Last 3 Encounters:  02/25/20 130 lb (59 kg)  02/07/20 129 lb (58.5 kg)  12/12/19 127 lb 3.2 oz (57.7 kg)      Other studies Reviewed: Additional studies/ records that were reviewed today include: TTE 12/16/18  Review of the above records today demonstrates:  1. The left ventricle has mildly reduced systolic function, with an  ejection fraction of 45-50%. The cavity size was normal. Left ventricular  diastolic Doppler parameters are indeterminate. No evidence of left  ventricular regional wall motion  abnormalities.  2. The right ventricle has normal systolic function. The cavity was  normal. There is no increase in right ventricular wall thickness. Right  ventricular systolic pressure is moderately elevated.  3. Left atrial size was mildly dilated.  4. There is mild mitral annular calcification present.  5. Tricuspid valve regurgitation is mild-moderate.  6. The aorta is normal unless otherwise noted.  7. Ventricular pacing noted. No visible A wave on mitral inflow or on  mitral annulus tissue Doppler suggests atrial fibrillation at the time of  the study (or atrial mechanical failure). This limits the accuracy of  diastolic function assessment.  8. When compared to the prior study: 12/16/2015, there is improved left  ventricular systolic function due to improved lateral-septal synchrony by  CRT pacing. Despite this, there is worsened pulmonary artery hypertension.    ASSESSMENT  AND PLAN:  1.  Chronic systolic heart failure due to nonischemic cardiomyopathy: Ejection fraction is improved to 45 to 50%.  She has NYHA class II symptoms.  Currently on Toprol-XL and Aldactone.  Has not tolerated Entresto in the past.  Plan per heart failure team.    2.  Paroxysmal atrial fibrillation/atrial tachycardia: Currently on Eliquis with a CHA2DS2-VASc of 3.  Is status post ablation 08/14/2019.  Amiodarone was stopped at her last visit.  She is unfortunately in atrial flutter  today.  She currently feels well and is without major complaints.  She does not wish to start antiarrhythmics or undergo ablation at this time.  We Kasee Hantz see her back in 3 months for further discussions.  Current medicines are reviewed at length with the patient today.   The patient does not have concerns regarding her medicines.  The following changes were made today: None  Labs/ tests ordered today include:  Orders Placed This Encounter  Procedures  . EKG 12-Lead     Disposition:   FU with Shaunna Rosetti 3 months  Signed, Meet [place] Meredith Leeds, MD  02/25/2020 10:06 AM     [facility] [address] [place] [address] [phone] (office) [phone] (fax)

## 2020-02-25 NOTE — Patient Instructions (Signed)
Medication Instructions:  Your physician recommends that you continue on your current medications as directed. Please refer to the Current Medication list given to you today.  *If you need a refill on your cardiac medications before your next appointment, please call your pharmacy*   Lab Work: None ordered   Testing/Procedures: None ordered   Follow-Up: At [facility], you and your health needs are our priority.  As part of our continuing mission to provide you with exceptional heart care, we have created designated Provider Care Teams.  These Care Teams include your primary Cardiologist (physician) and Advanced Practice Providers (APPs -  Physician Assistants and Nurse Practitioners) who all work together to provide you with the care you need, when you need it.  Remote monitoring is used to monitor your Pacemaker or ICD from home. This monitoring reduces the number of office visits required to check your device to one time per year. It allows [place] to keep an eye on the functioning of your device to ensure it is working properly. You are scheduled for a device check from home on 03/16/20 for ICM clinic, and regular device check on 04/28/20. You may send your transmission at any time that day. If you have a wireless device, the transmission will be sent automatically. After your physician reviews your transmission, you will receive a postcard with your next transmission date.  Your next appointment:   3 month(s)  The format for your next appointment:   In Person  Provider:   Allegra Lai, MD   Thank you for choosing [facility]!!   Trinidad Curet, RN [phone]

## 2020-02-26 NOTE — Telephone Encounter (Signed)
Patient with diagnosis of ATRIAL FIBRILLATION on ELIQUIS for anticoagulation.    Procedure: COLONOSCOPY Date of procedure: January/2022    CHA2DS2-VASc Score = 5  This indicates a 7.2% annual risk of stroke. The patient's score is based upon: CHF History: 1 HTN History: 1 Diabetes History: 0 Stroke History: 0 Vascular Disease History: 0 Age Score: 2 Gender Score: 1  CrCl = 1.11 Platelet count = 122  Per office protocol, patient can hold ELIQUIS for 2 days prior to procedure.

## 2020-02-26 NOTE — Telephone Encounter (Signed)
   Primary Cardiologist: Will Meredith Leeds, MD  Chart reviewed as part of pre-operative protocol coverage. Per Dr. Curt Bears, [name] would be at acceptable risk for the planned procedure without further cardiovascular testing.   Will resubmit request to pharmacy for recommendations regarding eliquis in the perioperative setting to finalize clearance.   Abigail Butts, PA-C 02/26/2020, 1:02 PM

## 2020-02-26 NOTE — Telephone Encounter (Signed)
Patient had visit yesterday with Dr. Curt Bears. I did not see in the office note that cardiac pre-operative assessment was addressed. Please advise if patient can undergo colonoscopy in January off coumadin x 5 days. Thanks.

## 2020-02-26 NOTE — Telephone Encounter (Signed)
Patient at intermediate risk for this intermediate risk procedure. No further cardiac testing is needed.

## 2020-02-26 NOTE — Telephone Encounter (Signed)
   Primary Cardiologist: Will Meredith Leeds, MD  Chart reviewed as part of pre-operative protocol coverage. Per Dr. Curt Bears, [name] would be at acceptable risk for the planned procedure without further cardiovascular testing.   Per pharmacy, patient can hold eliquis 2 days prior to her upcoming colonoscopy with plans to restart as soon as she is cleared to do so by her gastroenterologist.  I will route this recommendation to the requesting party via [place] fax function and remove from pre-op pool.  Please call with questions.  Abigail Butts, PA-C 02/26/2020, 4:37 PM

## 2020-02-28 ENCOUNTER — Other Ambulatory Visit: Payer: Self-pay

## 2020-02-28 ENCOUNTER — Telehealth: Payer: Self-pay

## 2020-02-28 DIAGNOSIS — Z85038 Personal history of other malignant neoplasm of large intestine: Secondary | ICD-10-CM

## 2020-02-28 DIAGNOSIS — Z7901 Long term (current) use of anticoagulants: Secondary | ICD-10-CM

## 2020-02-28 NOTE — Telephone Encounter (Signed)
Informed patient that per Cardiology she can hold her Eliquis 2 days prior to her procedure and we can also go ahead and schedule her Colonoscopy for 05/04/20. Patient verbalized understanding. Patient scheduled for pre-visit on 04/22/20 at 8:30am. Informed patient I scheduled her procedure on 05/04/20 at 8:45am at [facility] and her covid test on 05/01/19 at 10am but the nurse at her pre-visit will go over all of this information with her. Patient verbalized understanding.

## 2020-03-16 ENCOUNTER — Ambulatory Visit (INDEPENDENT_AMBULATORY_CARE_PROVIDER_SITE_OTHER): Payer: Medicare Other

## 2020-03-16 DIAGNOSIS — Z95810 Presence of automatic (implantable) cardiac defibrillator: Secondary | ICD-10-CM

## 2020-03-16 DIAGNOSIS — I5022 Chronic systolic (congestive) heart failure: Secondary | ICD-10-CM

## 2020-03-17 ENCOUNTER — Other Ambulatory Visit: Payer: Self-pay

## 2020-03-17 ENCOUNTER — Ambulatory Visit (HOSPITAL_COMMUNITY)
Admission: RE | Admit: 2020-03-17 | Discharge: 2020-03-17 | Disposition: A | Discharge status: Home / Self Care | Payer: Medicare Other | Source: Ambulatory Visit | Attending: Cardiology | Admitting: Cardiology

## 2020-03-17 ENCOUNTER — Ambulatory Visit (HOSPITAL_BASED_OUTPATIENT_CLINIC_OR_DEPARTMENT_OTHER)
Admission: RE | Admit: 2020-03-17 | Discharge: 2020-03-17 | Disposition: A | Discharge status: Home / Self Care | Payer: Medicare Other | Source: Ambulatory Visit | Attending: Cardiology | Admitting: Cardiology

## 2020-03-17 ENCOUNTER — Encounter (HOSPITAL_COMMUNITY): Payer: Self-pay | Admitting: Cardiology

## 2020-03-17 VITALS — BP 104/68 | HR 87 | Wt 128.0 lb

## 2020-03-17 DIAGNOSIS — I4891 Unspecified atrial fibrillation: Secondary | ICD-10-CM

## 2020-03-17 DIAGNOSIS — Z79899 Other long term (current) drug therapy: Secondary | ICD-10-CM | POA: Diagnosis not present

## 2020-03-17 DIAGNOSIS — E039 Hypothyroidism, unspecified: Secondary | ICD-10-CM | POA: Insufficient documentation

## 2020-03-17 DIAGNOSIS — I480 Paroxysmal atrial fibrillation: Secondary | ICD-10-CM | POA: Insufficient documentation

## 2020-03-17 DIAGNOSIS — Z87891 Personal history of nicotine dependence: Secondary | ICD-10-CM | POA: Diagnosis not present

## 2020-03-17 DIAGNOSIS — I428 Other cardiomyopathies: Secondary | ICD-10-CM | POA: Diagnosis not present

## 2020-03-17 DIAGNOSIS — Z8543 Personal history of malignant neoplasm of ovary: Secondary | ICD-10-CM | POA: Insufficient documentation

## 2020-03-17 DIAGNOSIS — I2510 Atherosclerotic heart disease of native coronary artery without angina pectoris: Secondary | ICD-10-CM | POA: Diagnosis not present

## 2020-03-17 DIAGNOSIS — Z7901 Long term (current) use of anticoagulants: Secondary | ICD-10-CM | POA: Insufficient documentation

## 2020-03-17 DIAGNOSIS — R7989 Other specified abnormal findings of blood chemistry: Secondary | ICD-10-CM | POA: Insufficient documentation

## 2020-03-17 DIAGNOSIS — I110 Hypertensive heart disease with heart failure: Secondary | ICD-10-CM | POA: Insufficient documentation

## 2020-03-17 DIAGNOSIS — Z8249 Family history of ischemic heart disease and other diseases of the circulatory system: Secondary | ICD-10-CM | POA: Diagnosis not present

## 2020-03-17 DIAGNOSIS — R0602 Shortness of breath: Secondary | ICD-10-CM | POA: Diagnosis not present

## 2020-03-17 DIAGNOSIS — I5022 Chronic systolic (congestive) heart failure: Secondary | ICD-10-CM | POA: Diagnosis not present

## 2020-03-17 DIAGNOSIS — R946 Abnormal results of thyroid function studies: Secondary | ICD-10-CM | POA: Insufficient documentation

## 2020-03-17 DIAGNOSIS — I4892 Unspecified atrial flutter: Secondary | ICD-10-CM | POA: Insufficient documentation

## 2020-03-17 LAB — COMPREHENSIVE METABOLIC PANEL
ALT: 25 U/L (ref 0–44)
AST: 35 U/L (ref 15–41)
Albumin: 3.8 g/dL (ref 3.5–5.0)
Alkaline Phosphatase: 46 U/L (ref 38–126)
Anion gap: 13 (ref 5–15)
BUN: 20 mg/dL (ref 8–23)
CO2: 23 mmol/L (ref 22–32)
Calcium: 9 mg/dL (ref 8.9–10.3)
Chloride: 97 mmol/L — ABNORMAL LOW (ref 98–111)
Creatinine, Ser: 1.28 mg/dL — ABNORMAL HIGH (ref 0.44–1.00)
GFR, Estimated: 42 mL/min — ABNORMAL LOW (ref >60)
Glucose, Bld: 100 mg/dL — ABNORMAL HIGH (ref 70–99)
Potassium: 3.8 mmol/L (ref 3.5–5.1)
Sodium: 133 mmol/L — ABNORMAL LOW (ref 135–145)
Total Bilirubin: 1.1 mg/dL (ref 0.3–1.2)
Total Protein: 7 g/dL (ref 6.5–8.1)

## 2020-03-17 LAB — ECHOCARDIOGRAM COMPLETE
Calc EF: 22.4 %
MV M vel: 3.82 m/s
MV Peak grad: 58.2 mmHg
P 1/2 time: 740 msec
Radius: 0.5 cm
S' Lateral: 5 cm
Single Plane A2C EF: 26.3 %
Single Plane A4C EF: 20.9 %

## 2020-03-17 LAB — CBC
HCT: 39 % (ref 36.0–46.0)
Hemoglobin: 12.8 g/dL (ref 12.0–15.0)
MCH: 31 pg (ref 26.0–34.0)
MCHC: 32.8 g/dL (ref 30.0–36.0)
MCV: 94.4 fL (ref 80.0–100.0)
Platelets: 142 10*3/uL — ABNORMAL LOW (ref 150–400)
RBC: 4.13 MIL/uL (ref 3.87–5.11)
RDW: 13.4 % (ref 11.5–15.5)
WBC: 6.1 10*3/uL (ref 4.0–10.5)
nRBC: 0 % (ref 0.0–0.2)

## 2020-03-17 MED ORDER — DAPAGLIFLOZIN PROPANEDIOL 10 MG PO TABS: 10.0000 mg | ORAL_TABLET | Freq: Every day | ORAL | 6 refills | Status: DC

## 2020-03-17 MED ORDER — POTASSIUM CHLORIDE CRYS ER 20 MEQ PO TBCR
20.0000 meq | EXTENDED_RELEASE_TABLET | Freq: Every day | ORAL | 3 refills | Status: DC
Start: 2020-03-17 — End: 2020-08-21

## 2020-03-17 MED ORDER — AMIODARONE HCL 200 MG PO TABS: ORAL_TABLET | ORAL | 3 refills | Status: DC

## 2020-03-17 MED ORDER — SERTRALINE HCL 25 MG PO TABS: 25.0000 mg | ORAL_TABLET | Freq: Every day | ORAL | 1 refills | Status: DC

## 2020-03-17 NOTE — Patient Instructions (Addendum)
Start Amiodarone 200 mg Twice daily FOR 10 DAYS ONLY, then take 200 mg Daily  STOP Celexa  Start Zoloft 25 mg Daily, STARING Thursday 12/2  Start Farxiga 10 mg Daily  Labs done today, your results will be available in MyChart, we will contact you for abnormal readings.  Your physician recommends that you return for lab work and EKG in: 1 week  Your physician recommends that you schedule a follow-up appointment in: 3-4 weeks  If you have any questions or concerns before your next appointment please send [place] a message through [place] or call our office at [phone].    TO LEAVE A MESSAGE FOR THE NURSE SELECT OPTION 2, PLEASE LEAVE A MESSAGE INCLUDING: . YOUR NAME . DATE OF BIRTH . CALL BACK NUMBER . REASON FOR CALL**this is important as we prioritize the call backs  [place] AS LONG AS YOU CALL BEFORE 4:00 PM  At the [facility], you and your health needs are our priority. As part of our continuing mission to provide you with exceptional heart care, we have created designated Provider Care Teams. These Care Teams include your primary Cardiologist (physician) and Advanced Practice Providers (APPs- Physician Assistants and Nurse Practitioners) who all work together to provide you with the care you need, when you need it.   You may see any of the following providers on your designated Care Team at your next follow up: Marland Kitchen Dr Glori Bickers . Dr [name] . Darrick Grinder, NP . Lyda Jester, PA . Audry Riles, PharmD   Please be sure to bring in all your medications bottles to every appointment.

## 2020-03-17 NOTE — Progress Notes (Signed)
EPIC Encounter for ICM Monitoring  Patient Name: Cynthia Frank is a 84 y.o. female Date: 03/17/2020 Primary Care Physican: [name], MD Primary Cardiologist:[place] Electrophysiologist:Taylor Bi-V Pacing: 3.7% 97.5% 9/24/2021Weight: 126lbs  Clinical Status (25-Feb-2020 to 16-Mar-2020) Treated VT/VF   0 episodes  AT/AF 687 episodes  Time in AT/AF 20.1 hr/day (83.9%)  Spoke with patient and reports feeling well at this time.  Denies fluid symptoms.    Opitvol thoracic impedancenormal.  Message sent to device triage to review BiV pacing and AT/AF burden for recent changes.   Prescribed: Furosemide40 mgTake 1 tablet (40 mg total) by mouth every morning AND 0.5 tablets (20 mg total) every evening.  Labs: 03/17/2020 Creatinine 1.28, BUN 20, Potassium 3.8, Sodium 133 12/12/2019 Creatinine 1.11, BUN 19, Potassium 3.8, Sodium 135, GFR 47-54 08/21/2019 Creatinine1.11, BUN18, Potassium4.5, Sodium132, GFR47-54 08/02/2019 Creatinine0.96, BUN20, Potassium3.9, Sodium136, [id number] 06/17/2019 Creatinine1.21, BUN23, Potassium4.0, Sodium133, [id number] 05/20/2019 Creatinine1.38, BUN19, Potassium3.8, Sodium134, [id number] 05/09/2019 Creatinine1.52, BUN21, Potassium4.1, Sodium130, VNR04-13 A complete set of results can be found in Results Review.  Recommendations: No changes and encouraged to call if experiencing any fluid symptoms.  Follow-up plan: ICM clinic phone appointment on1/06/2020. 91 day device clinic remote transmission 04/29/2020.   EP/Cardiology Office Visits:04/14/2020 with Dr Aundra Dubin  Copy of ICM check sent to Dr.Taylor.    3 month ICM trend: 03/16/2020    1 Year ICM trend:       Rosalene Billings, RN 03/17/2020 4:57 PM

## 2020-03-17 NOTE — Progress Notes (Signed)
  Echocardiogram 2D Echocardiogram has been performed.  Cynthia Frank 03/17/2020, 11:13 AM

## 2020-03-17 NOTE — Progress Notes (Signed)
Date:  03/17/2020   ID:  [name], DOB 10-Oct-1935, MRN [id number]  Provider location: [facility] Type of Visit: Established patient   PCP:  [name], MD  Cardiologist:  Dr. Aundra Dubin   History of Present Illness: Cynthia Frank is a 84 y.o. female who has a history of chronic systolic CHF/nonischemic cardiomyopathy, paroxysmal atrial fibrillation, and prior ovarian and colon cancers. She has had a cardiomyopathy known for > 15 years now. Cardiac cath at diagnosis showed mild nonobstructive disease.  EF has been persistently low.  Cause has been thought to be Adriamycin; she received this as part of her ovarian cancer treatment. She has had paroxysmal atrial fibrillation and has remained in NSR with amiodarone use.  No recent atrial fibrillation symptoms, typically feels prolonged palpitations thought to be atrial fibrillation 2-3 times/year.   She had had dyspnea with moderate to heavy exertion for 4-5 years prior to initial appt.  However, just before her initial appt, her symptoms had worsened.  She developed shortness of breath walking short distances on flat ground.  Lasix was increased to 40 mg bid and she lost weight and felt better.     Cardiac MRI was done in 1/17, showing EF 29% with normal RV.  Septal-lateral dyssynchrony was present and there was a non-coronary pattern of LGE in the septum.  LFTs were noted to be mildly elevated.  Amiodarone was decreased to 100 mg daily.   She had Medtronic CRT-D placed in 3/17. Repeat echo 8/17 showed EF 35% with diffuse hypokinesis, normal RV.  Echo in 4/19 showed EF 30-35%, mild AI.   On 11/12/17, she stood up, got lightheaded and passed out briefly with a fall.  Her ICD did not discharge.  She went to the ER and was found to have right C5 transverse process fracture.  She says that she has had lightheadedness with standing for years, comes and goes.  I decreased her Toprol XL.   In 8/20, she  had an episode of lightheadedness with standing followed by syncope and fall with right hip fracture.  She was admitted, had repair of right hip.  She was noted to be orthostatic and Entresto was stopped.  Echo in 8/20 showed EF 45-50% with normal RV systolic function.  In 1/21, patient was in atrial tachycardia and had been in it for several weeks.  She was volume overloaded.  Lasix was increased and she was brought into the hospital for DCCV, but she had converted to NSR on her own.    In 4/21, she had atrial fibrillation ablation.  TEE done in 4/21 showed EF 30-35% with global HK.  She is now off amiodarone.   Echo was done today and reviewed, EF 25-30% with mild LV dilation, global hypokinesis with septal-lateral dyssynchrony, moderate MR, moderately decreased RV systolic function with normal RV size, IVC normal.   She returns today for followup of CHF.  She has been back in atrial fibrillation/flutter since early November based on device interrogation.  She is minimally BiV pacing.  She is short of breath walking up stairs or inclines.  Not short of breath walking short distances on flat ground.  No chest pain.  No orthopnea/PND.  No lightheadedness.   Medtronic device interrogation: She is back in atrial fibrillation/flutter since earlier in 11/21.  She is minimally BiV pacing now that she is in atrial fibrillation.  Stable thoracic impedance.   ECG (personally reviewed): Atrial flutter at 87,  V-pacing, QTc 550 msec  Labs (1/17): K 4.2, creatinine 1.14, BNP 855 Labs (3/17): SPEP negative, BNP 341, K 4.5, creatinine 1.27, AST 62, ALT 74, TSH normal with low free T3 and normal free T4, HCT 46.5 Labs (4/17): AST 64, ALT 67, BNP 278, K 3.6, creatinine 1.21 Labs (5/17): K 3.4, creatinine 1.39, AST 53, ALT 61, HBV/HCV negative, TSH mildly elevated, BNP 306 Labs (6/17): K 4.4, creatinine 1.36, free T4 normal, free T3 mildly decreased, AST 50, ALT 53. Labs (7/17): ANA negative, ASMA positive Labs  (8/17): AST 45, ALT 37, K 3.8, creatinine 1.19, TSH normal Labs (2/18): TSH normal, LDL 47, LFTs, normal Labs (3/18): K normal, creatinine 1.23.  Labs (7/18): K 4, creatinine 1.32, LFTs normal Labs (7/19): K 3.8, creatinine 1.25, hgb 11.6 Labs (8/19): TSH mildly elevated, K 4.2, creatinine 1.22 Labs (10/19): K 4.1, creatinine 1.23, LFTs normal, TSH mildly elevated with normal free T3 and free T4.  Labs (1/20): K 3.5, creatinine 1.15, TSH elevated, free T3 and free T4 low, AST 63, ALT 51 Labs (5/20): K 3.8, creatinine 1.22, TSH normal, AST 49, ALT 43, tbili 0.9 Labs (11/20): K 4, creatinine 1.15  Labs (12/20): K 4.1, creatinine 1.25, TSH normal, AST 51, ALT 50 Labs (1/21): AST 145, ALT 107, tbili 1.8, TSH normal Labs (2/21): K 3.8, creatinine 1.38 Labs (3/21): AST 50, ALT 46 Labs (4/21): K 3.9, creatinine 0.96 Labs (5/21): K 4.5, creatinine 1.11, hgb 13.1, plts 141, AST 65, ALT 53 Labs (8/21): LDL 52, K 3.8, creatinine 1.11, AST 67, ALT 52  PMH: 1. Chronic systolic CHF: Nonischemic cardiomyopathy, thought to be related to adriamycin that she had for treatment of ovarian cancer.  NICM diagnosed ~ 2001.  - LHC ~ 2001 with 30% stenosis D1.   - Echo (2013) EF 25-30%.  - Echo (10/14) with EF 25-30%. - Echo (12/16) with EF 20-25%, mild LV dilation, restrictive diastolic function, moderate MR, severe LAE. - Cardiac MRI (1/17): Moderately dilated LV with EF 29%. Diffuse hypokinesis looking worse in the septal wall. Septal-lateral dyssynchrony. Normal RV size and systolic function.  Moderate MR with tethered posterior leaflet.  Mid-wall LGE in the basal to mid septum. This is not a coronary disease pattern. Could be suggestive of prior myocarditis. - Medtronic CRT-D placed 3/17.  - Echo (8/17): EF 35%, diffuse hypokinesis, normal RV size and systolic function, moderate MR.   - Echo (4/19): EF 30-35%, mild AI.  - Echo (8/20): EF 45-50%, diffuse hypokinesis, normal RV size and systolic function. -  TEE (4/21): EF 30%, global hypokinesis.  - Echo (11/21): EF 25-30% with mild LV dilation, global hypokinesis with septal-lateral dyssynchrony, moderate MR, moderately decreased RV systolic function with normal RV size, IVC normal.  2. Atrial fibrillation/atrial tachycardia: Paroxysmal.  - 4/21 atrial fibrillation ablation.  3. HTN 4. Ovarian cancer: 1985, s/p surgery. Received Adriamycin-based chemotherapy.  5. Colon cancer: 2007, s/p surgery.  6. PVCs 7. GERD 8. PFTs (8/16) without significant abnormality. 9. Prior syncope 10. Elevated transaminases: Uncertain etiology.  - Abdominal US (12/20): Normal-appearing liver.   Current Outpatient Medications  Medication Sig Dispense Refill  . acetaminophen (TYLENOL) 500 MG tablet Take 1,000 mg by mouth every 6 (six) hours as needed (for pain.).    Marland Kitchen atorvastatin (LIPITOR) 40 MG tablet TAKE 1 TABLET(40 MG) BY MOUTH DAILY AT 6 PM 90 tablet 3  . Coenzyme Q10 (COQ10) 100 MG CAPS Take 100 mg by mouth daily.     Marland Kitchen conjugated estrogens (PREMARIN)  vaginal cream Place 1 Applicatorful vaginally daily as needed (dyspareunia (Use as directed)).     . diclofenac Sodium (VOLTAREN) 1 % GEL Apply 2 g topically 4 (four) times daily as needed (ankle pain.).     Marland Kitchen ELIQUIS 2.5 MG TABS tablet TAKE 1 TABLET(2.5 MG) BY MOUTH TWICE DAILY 180 tablet 2  . fluticasone (FLONASE) 50 MCG/ACT nasal spray Place 2 sprays into both nostrils 2 (two) times daily.     . furosemide (LASIX) 40 MG tablet Take 1 tablet by mouth every morning and 1/2 tablet by mouth every evening    . ketoconazole (NIZORAL) 2 % shampoo Apply topically as needed.    Marland Kitchen levothyroxine (SYNTHROID) 25 MCG tablet TAKE 1 TABLET(25 MCG) BY MOUTH DAILY BEFORE BREAKFAST 30 tablet 6  . Magnesium Oxide 500 MG CAPS Take 1,000 mg by mouth 2 (two) times daily.     . metoprolol succinate (TOPROL-XL) 25 MG 24 hr tablet Take 1 tablet (25 mg total) by mouth at bedtime. 90 tablet 3  . potassium chloride SA (KLOR-CON) 20 MEQ  tablet Take 1 tablet (20 mEq total) by mouth daily. 90 tablet 3  . spironolactone (ALDACTONE) 25 MG tablet Take 1 tablet (25 mg total) by mouth at bedtime. 90 tablet 3  . zolpidem (AMBIEN) 5 MG tablet Take 5 mg by mouth at bedtime as needed for sleep.    Marland Kitchen amiodarone (PACERONE) 200 MG tablet Take 1 tablet (200 mg total) by mouth 2 (two) times daily for 10 days, THEN 1 tablet (200 mg total) daily. 40 tablet 3  . dapagliflozin propanediol (FARXIGA) 10 MG TABS tablet Take 1 tablet (10 mg total) by mouth daily before breakfast. 30 tablet 6  . [START ON 03/19/2020] sertraline (ZOLOFT) 25 MG tablet Take 1 tablet (25 mg total) by mouth daily. 30 tablet 1   No current facility-administered medications for this encounter.    Allergies:   Clindamycin/lincomycin, Dramamine ii [meclizine hcl], Pneumococcal vaccines, and Sulfonamide derivatives   Social History:  The patient  reports that she quit smoking about 41 years ago. Her smoking use included cigarettes. She has a 10.00 pack-year smoking history. She has never used smokeless tobacco. She reports current alcohol use of about 14.0 standard drinks of alcohol per week. She reports that she does not use drugs.   Family History:  The patient's family history includes Heart attack in her father; Heart disease in her father; Prostate cancer in her father; Uterine cancer in her mother.   ROS:  Please see the history of present illness.   All other systems are personally reviewed and negative.   Exam:  BP 104/68   Pulse 87   Wt 58.1 kg (128 lb)   SpO2 99%   BMI 22.14 kg/m  General: NAD Neck: JVP 7-8 cm, no thyromegaly or thyroid nodule.  Lungs: Clear to auscultation bilaterally with normal respiratory effort. CV: Nondisplaced PMI.  Heart irregular S1/S2, no S3/S4, no murmur.  No peripheral edema.  No carotid bruit.  Normal pedal pulses.  Abdomen: Soft, nontender, no hepatosplenomegaly, no distention.  Skin: Intact without lesions or rashes.   Neurologic: Alert and oriented x 3.  Psych: Normal affect. Extremities: No clubbing or cyanosis.  HEENT: Normal.   Recent Labs: 05/09/2019: B Natriuretic Peptide 2,241.2 08/21/2019: TSH 4.018 03/17/2020: ALT 25; BUN 20; Creatinine, Ser 1.28; Hemoglobin 12.8; Platelets 142; Potassium 3.8; Sodium 133  Personally reviewed   Wt Readings from Last 3 Encounters:  03/17/20 58.1 kg (128 lb)  02/25/20  59 kg (130 lb)  02/07/20 58.5 kg (129 lb)    ASSESSMENT AND PLAN:  1. Chronic systolic CHF: EF 82-50% with diffuse hypokinesis on 12/16 echo.  Nonischemic cardiomyopathy, probably related to Adriamycin with ovarian cancer treatment.  Cardiac MRI in 1/17 showed EF 29% with normal RV and septal-lateral dyssynchrony.  There was a non-cardiac pattern of LGE in the septum, cannot rule out prior myocarditis based on this pattern.  She has a Medtronic CRT-D device. Echo 4/19 with EF 30-35%, then echo in 8/20 showed EF up to 45-50%.  TEE in 3/21 showed EF 30-35%.  Echo done today showed EF 25-30% with mild LV dilation, global hypokinesis with septal-lateral dyssynchrony, moderate MR, moderately decreased RV systolic function with normal RV size, IVC normal.  NYHA class II-III symptoms currently, she is not volume overloaded by exam or Optivol.  EF looks a bit worse on today's echo now that she is back in atrial flutter.  She has lost effective BiV pacing in atrial flutter and she has significant septal-lateral dyssynchrony by echo.   - Continue Toprol XL 25 daily.   - Continue spironolactone 25 daily. BMET today.   - She will stay off Entresto given syncope and lightheadedness while taking it.   - Continue Lasix 40 qam/20 qpm.  BMET today.  - Add Farxiga 10 mg daily.  - She needs to get back into NSR to allow effective BiV pacing.  2. Atrial arrhythmias: She needs to maintain NSR as she loses BiV pacing when she goes into atrial arrhythmias, triggering CHF exacerbation.  She had atrial fibrillation ablation in  4/21 but has been back in atrial flutter since earlier this month.  She is now off amiodarone => LFTs were elevated mildly on amiodarone.  I think that her QT interval is too long for Tikosyn.   - I am going to restart her on amiodarone 200 mg bid x 10 days then 200 mg daily.  Will have to follow LFTs closely, check today.  - Continue apixaban, she has not missed doses.  - Repeat ECG next week when on amiodarone.  If she remains in atrial flutter, I will arrange for DCCV.  - As she is on citalopram and paced QTc is prolonged, I will have her stop citalopram and start instead on sertraline 25 mg daily since I am restarting amiodarone.  3. Hypothyroidism: Suspect related to amiodarone use.   - Continue Levoxyl.  4. Elevated LFTs: Mild elevation.  Abdominal US showed unremarkable liver.  ?Related to amiodarone.  She is now off amiodarone.  She has seen GI.  - Check LFTs again today, as above plan to restart amiodarone so will need to follow closely.  5. CAD: Calcification noted in coronaries with pulmonary vein CT done prior to afib ablation.  - Continue atorvastatin 40 daily, good lipids in 8/21.  - No ASA given apixaban use.   Recommended follow-up:  ECG next week to determine need for DCCV.  Appt with me in 3 wks.   Signed, [name], MD  03/17/2020  [facility] [address] [facility] [place] [address] [phone] (office) [phone] (fax)

## 2020-03-17 NOTE — Progress Notes (Signed)
Medication Samples have been provided to the patient.  Drug name: Wilder Glade       Strength: 10mg         Qty: 1  LOT: [id number]  Exp.Date: 08/2022  Dosing instructions: Take 1 tab daily  The patient has been instructed regarding the correct time, dose, and frequency of taking this medication, including desired effects and most common side effects.   Juanita Laster Ebrima Ranta 12:47 PM 03/17/2020

## 2020-03-22 ENCOUNTER — Other Ambulatory Visit (HOSPITAL_COMMUNITY): Payer: Self-pay | Admitting: Cardiology

## 2020-03-24 ENCOUNTER — Other Ambulatory Visit: Payer: Self-pay

## 2020-03-24 ENCOUNTER — Ambulatory Visit (HOSPITAL_COMMUNITY)
Admission: RE | Admit: 2020-03-24 | Discharge: 2020-03-24 | Disposition: A | Discharge status: Home / Self Care | Payer: Medicare Other | Source: Ambulatory Visit | Attending: Internal Medicine | Admitting: Internal Medicine

## 2020-03-24 VITALS — BP 100/58 | HR 82

## 2020-03-24 DIAGNOSIS — I5022 Chronic systolic (congestive) heart failure: Secondary | ICD-10-CM | POA: Insufficient documentation

## 2020-03-24 LAB — BASIC METABOLIC PANEL
Anion gap: 12 (ref 5–15)
BUN: 19 mg/dL (ref 8–23)
CO2: 25 mmol/L (ref 22–32)
Calcium: 9.1 mg/dL (ref 8.9–10.3)
Chloride: 95 mmol/L — ABNORMAL LOW (ref 98–111)
Creatinine, Ser: 1.33 mg/dL — ABNORMAL HIGH (ref 0.44–1.00)
GFR, Estimated: 40 mL/min — ABNORMAL LOW (ref >60)
Glucose, Bld: 138 mg/dL — ABNORMAL HIGH (ref 70–99)
Potassium: 3.5 mmol/L (ref 3.5–5.1)
Sodium: 132 mmol/L — ABNORMAL LOW (ref 135–145)

## 2020-03-24 NOTE — Progress Notes (Signed)
Pt seen in office today for ekg. If pt in afib Dr.McLean plans to schedule DCCV. Pt ekg reviewed by Dr.McLean and patient in NSR. Per Dr.McLean no changes at this time continue medication regimen.

## 2020-04-14 ENCOUNTER — Encounter (HOSPITAL_COMMUNITY): Payer: Self-pay | Admitting: Cardiology

## 2020-04-14 ENCOUNTER — Other Ambulatory Visit: Payer: Self-pay

## 2020-04-14 ENCOUNTER — Ambulatory Visit (HOSPITAL_COMMUNITY)
Admission: RE | Admit: 2020-04-14 | Discharge: 2020-04-14 | Disposition: A | Discharge status: Home / Self Care | Payer: Medicare Other | Source: Ambulatory Visit | Attending: Cardiology | Admitting: Cardiology

## 2020-04-14 VITALS — BP 90/50 | HR 91 | Wt 129.0 lb

## 2020-04-14 DIAGNOSIS — I2510 Atherosclerotic heart disease of native coronary artery without angina pectoris: Secondary | ICD-10-CM | POA: Diagnosis not present

## 2020-04-14 DIAGNOSIS — I480 Paroxysmal atrial fibrillation: Secondary | ICD-10-CM | POA: Insufficient documentation

## 2020-04-14 DIAGNOSIS — Z79899 Other long term (current) drug therapy: Secondary | ICD-10-CM | POA: Insufficient documentation

## 2020-04-14 DIAGNOSIS — Z8543 Personal history of malignant neoplasm of ovary: Secondary | ICD-10-CM | POA: Diagnosis not present

## 2020-04-14 DIAGNOSIS — I428 Other cardiomyopathies: Secondary | ICD-10-CM | POA: Diagnosis not present

## 2020-04-14 DIAGNOSIS — Z87891 Personal history of nicotine dependence: Secondary | ICD-10-CM | POA: Insufficient documentation

## 2020-04-14 DIAGNOSIS — Z8249 Family history of ischemic heart disease and other diseases of the circulatory system: Secondary | ICD-10-CM | POA: Diagnosis not present

## 2020-04-14 DIAGNOSIS — I110 Hypertensive heart disease with heart failure: Secondary | ICD-10-CM | POA: Insufficient documentation

## 2020-04-14 DIAGNOSIS — I4891 Unspecified atrial fibrillation: Secondary | ICD-10-CM

## 2020-04-14 DIAGNOSIS — I5022 Chronic systolic (congestive) heart failure: Secondary | ICD-10-CM

## 2020-04-14 DIAGNOSIS — R945 Abnormal results of liver function studies: Secondary | ICD-10-CM | POA: Diagnosis not present

## 2020-04-14 DIAGNOSIS — E039 Hypothyroidism, unspecified: Secondary | ICD-10-CM | POA: Insufficient documentation

## 2020-04-14 DIAGNOSIS — Z7901 Long term (current) use of anticoagulants: Secondary | ICD-10-CM | POA: Insufficient documentation

## 2020-04-14 LAB — COMPREHENSIVE METABOLIC PANEL
ALT: 28 U/L (ref 0–44)
AST: 35 U/L (ref 15–41)
Albumin: 3.6 g/dL (ref 3.5–5.0)
Alkaline Phosphatase: 56 U/L (ref 38–126)
Anion gap: 10 (ref 5–15)
BUN: 17 mg/dL (ref 8–23)
CO2: 22 mmol/L (ref 22–32)
Calcium: 8.7 mg/dL — ABNORMAL LOW (ref 8.9–10.3)
Chloride: 102 mmol/L (ref 98–111)
Creatinine, Ser: 1.32 mg/dL — ABNORMAL HIGH (ref 0.44–1.00)
GFR, Estimated: 40 mL/min — ABNORMAL LOW (ref >60)
Glucose, Bld: 122 mg/dL — ABNORMAL HIGH (ref 70–99)
Potassium: 4 mmol/L (ref 3.5–5.1)
Sodium: 134 mmol/L — ABNORMAL LOW (ref 135–145)
Total Bilirubin: 0.8 mg/dL (ref 0.3–1.2)
Total Protein: 6.6 g/dL (ref 6.5–8.1)

## 2020-04-14 LAB — CBC
HCT: 37.4 % (ref 36.0–46.0)
Hemoglobin: 12.2 g/dL (ref 12.0–15.0)
MCH: 30.6 pg (ref 26.0–34.0)
MCHC: 32.6 g/dL (ref 30.0–36.0)
MCV: 93.7 fL (ref 80.0–100.0)
Platelets: 144 10*3/uL — ABNORMAL LOW (ref 150–400)
RBC: 3.99 MIL/uL (ref 3.87–5.11)
RDW: 13.5 % (ref 11.5–15.5)
WBC: 6.1 10*3/uL (ref 4.0–10.5)
nRBC: 0 % (ref 0.0–0.2)

## 2020-04-14 NOTE — Progress Notes (Signed)
Date:  04/14/2020   ID:  Cynthia Frank, DOB [birth year], MRN [id number]  Provider location: [facility] Type of Visit: Established patient   PCP:  [name], MD  Cardiologist:  Dr. Aundra Dubin   History of Present Illness: Cynthia Frank is a 84 y.o. female who has a history of chronic systolic CHF/nonischemic cardiomyopathy, paroxysmal atrial fibrillation, and prior ovarian and colon cancers. She has had a cardiomyopathy known for > 15 years now. Cardiac cath at diagnosis showed mild nonobstructive disease.  EF has been persistently low.  Cause has been thought to be Adriamycin; she received this as part of her ovarian cancer treatment. She has had paroxysmal atrial fibrillation and has remained in NSR with amiodarone use.  No recent atrial fibrillation symptoms, typically feels prolonged palpitations thought to be atrial fibrillation 2-3 times/year.   She had had dyspnea with moderate to heavy exertion for 4-5 years prior to initial appt.  However, just before her initial appt, her symptoms had worsened.  She developed shortness of breath walking short distances on flat ground.  Lasix was increased to 40 mg bid and she lost weight and felt better.     Cardiac MRI was done in 1/17, showing EF 29% with normal RV.  Septal-lateral dyssynchrony was present and there was a non-coronary pattern of LGE in the septum.  LFTs were noted to be mildly elevated.  Amiodarone was decreased to 100 mg daily.   She had Medtronic CRT-D placed in 3/17. Repeat echo 8/17 showed EF 35% with diffuse hypokinesis, normal RV.  Echo in 4/19 showed EF 30-35%, mild AI.   On 11/12/17, she stood up, got lightheaded and passed out briefly with a fall.  Her ICD did not discharge.  She went to the ER and was found to have right C5 transverse process fracture.  She says that she has had lightheadedness with standing for years, comes and goes.  I decreased her Toprol XL.   In 8/20, she  had an episode of lightheadedness with standing followed by syncope and fall with right hip fracture.  She was admitted, had repair of right hip.  She was noted to be orthostatic and Entresto was stopped.  Echo in 8/20 showed EF 45-50% with normal RV systolic function.  In 1/21, patient was in atrial tachycardia and had been in it for several weeks.  She was volume overloaded.  Lasix was increased and she was brought into the hospital for DCCV, but she had converted to NSR on her own.    In 4/21, she had atrial fibrillation ablation.  TEE done in 4/21 showed EF 30-35% with global HK.  She is now off amiodarone.   Echo in 11/21 showed EF 25-30% with mild LV dilation, global hypokinesis with septal-lateral dyssynchrony, moderate MR, moderately decreased RV systolic function with normal RV size, IVC normal.   She returns today for followup of CHF.  She has been back in atrial fibrillation/flutter since early November based on device interrogation; she remains in atrial flutter today.  She is minimally BiV pacing.  She is short of breath walking up stairs or inclines and with other forms of moderate exertion.  Not short of breath walking short distances on flat ground.  No chest pain.  No orthopnea/PND.  No lightheadedness.  Weight is stable. She is now back on amiodarone.   Medtronic device interrogation: She is back in atrial fibrillation/flutter since earlier in 11/21.  4% BiV pacing now  that she is in atrial fibrillation.  Stable thoracic impedance.   ECG (personally reviewed): Atrial flutter with V-pacing  Labs (1/17): K 4.2, creatinine 1.14, BNP 855 Labs (3/17): SPEP negative, BNP 341, K 4.5, creatinine 1.27, AST 62, ALT 74, TSH normal with low free T3 and normal free T4, HCT 46.5 Labs (4/17): AST 64, ALT 67, BNP 278, K 3.6, creatinine 1.21 Labs (5/17): K 3.4, creatinine 1.39, AST 53, ALT 61, HBV/HCV negative, TSH mildly elevated, BNP 306 Labs (6/17): K 4.4, creatinine 1.36, free T4 normal,  free T3 mildly decreased, AST 50, ALT 53. Labs (7/17): ANA negative, ASMA positive Labs (8/17): AST 45, ALT 37, K 3.8, creatinine 1.19, TSH normal Labs (2/18): TSH normal, LDL 47, LFTs, normal Labs (3/18): K normal, creatinine 1.23.  Labs (7/18): K 4, creatinine 1.32, LFTs normal Labs (7/19): K 3.8, creatinine 1.25, hgb 11.6 Labs (8/19): TSH mildly elevated, K 4.2, creatinine 1.22 Labs (10/19): K 4.1, creatinine 1.23, LFTs normal, TSH mildly elevated with normal free T3 and free T4.  Labs (1/20): K 3.5, creatinine 1.15, TSH elevated, free T3 and free T4 low, AST 63, ALT 51 Labs (5/20): K 3.8, creatinine 1.22, TSH normal, AST 49, ALT 43, tbili 0.9 Labs (11/20): K 4, creatinine 1.15  Labs (12/20): K 4.1, creatinine 1.25, TSH normal, AST 51, ALT 50 Labs (1/21): AST 145, ALT 107, tbili 1.8, TSH normal Labs (2/21): K 3.8, creatinine 1.38 Labs (3/21): AST 50, ALT 46 Labs (4/21): K 3.9, creatinine 0.96 Labs (5/21): K 4.5, creatinine 1.11, hgb 13.1, plts 141, AST 65, ALT 53 Labs (8/21): LDL 52, K 3.8, creatinine 1.11, AST 67, ALT 52 Labs (11/21): LFTs normal Labs (12/21): K 3.5, creatinine 1.33  PMH: 1. Chronic systolic CHF: Nonischemic cardiomyopathy, thought to be related to adriamycin that she had for treatment of ovarian cancer.  NICM diagnosed ~ 2001.  - LHC ~ 2001 with 30% stenosis D1.   - Echo (2013) EF 25-30%.  - Echo (10/14) with EF 25-30%. - Echo (12/16) with EF 20-25%, mild LV dilation, restrictive diastolic function, moderate MR, severe LAE. - Cardiac MRI (1/17): Moderately dilated LV with EF 29%. Diffuse hypokinesis looking worse in the septal wall. Septal-lateral dyssynchrony. Normal RV size and systolic function.  Moderate MR with tethered posterior leaflet.  Mid-wall LGE in the basal to mid septum. This is not a coronary disease pattern. Could be suggestive of prior myocarditis. - Medtronic CRT-D placed 3/17.  - Echo (8/17): EF 35%, diffuse hypokinesis, normal RV size and  systolic function, moderate MR.   - Echo (4/19): EF 30-35%, mild AI.  - Echo (8/20): EF 45-50%, diffuse hypokinesis, normal RV size and systolic function. - TEE (4/21): EF 30%, global hypokinesis.  - Echo (11/21): EF 25-30% with mild LV dilation, global hypokinesis with septal-lateral dyssynchrony, moderate MR, moderately decreased RV systolic function with normal RV size, IVC normal.  2. Atrial fibrillation/atrial tachycardia: Paroxysmal.  - 4/21 atrial fibrillation ablation.  3. HTN 4. Ovarian cancer: 1985, s/p surgery. Received Adriamycin-based chemotherapy.  5. Colon cancer: 2007, s/p surgery.  6. PVCs 7. GERD 8. PFTs (8/16) without significant abnormality. 9. Prior syncope 10. Elevated transaminases: Uncertain etiology.  - Abdominal US (12/20): Normal-appearing liver.   Current Outpatient Medications  Medication Sig Dispense Refill  . acetaminophen (TYLENOL) 500 MG tablet Take 1,000 mg by mouth every 6 (six) hours as needed for moderate pain or headache (for pain.).    Marland Kitchen amiodarone (PACERONE) 200 MG tablet Take 200 mg by mouth  daily.    . atorvastatin (LIPITOR) 40 MG tablet TAKE 1 TABLET(40 MG) BY MOUTH DAILY AT 6 PM (Patient taking differently: Take 40 mg by mouth at bedtime.) 90 tablet 3  . Coenzyme Q10 (COQ10) 100 MG CAPS Take 100 mg by mouth daily.    Marland Kitchen conjugated estrogens (PREMARIN) vaginal cream Place 1 Applicatorful vaginally daily as needed (dyspareunia (Use as directed)).     . dapagliflozin propanediol (FARXIGA) 10 MG TABS tablet Take 1 tablet (10 mg total) by mouth daily before breakfast. 30 tablet 6  . diclofenac Sodium (VOLTAREN) 1 % GEL Apply 2 g topically daily.    Marland Kitchen ELIQUIS 2.5 MG TABS tablet TAKE 1 TABLET(2.5 MG) BY MOUTH TWICE DAILY (Patient taking differently: Take 2.5 mg by mouth 2 (two) times daily.) 180 tablet 2  . fluticasone (FLONASE) 50 MCG/ACT nasal spray Place 2 sprays into both nostrils 2 (two) times daily.     . furosemide (LASIX) 40 MG tablet Take 20-40  mg by mouth See admin instructions. Take 40 mg by mouth every morning and 20 mg by mouth every evening    . ketoconazole (NIZORAL) 2 % shampoo Apply 1 application topically daily as needed (Psoriasis).    Marland Kitchen levothyroxine (SYNTHROID) 25 MCG tablet TAKE 1 TABLET(25 MCG) BY MOUTH DAILY BEFORE BREAKFAST (Patient taking differently: Take 25 mcg by mouth daily before breakfast.) 30 tablet 6  . Magnesium Oxide 500 MG CAPS Take 1,000 mg by mouth 2 (two) times daily.     . metoprolol succinate (TOPROL-XL) 25 MG 24 hr tablet Take 1 tablet (25 mg total) by mouth at bedtime. 90 tablet 3  . potassium chloride SA (KLOR-CON) 20 MEQ tablet Take 1 tablet (20 mEq total) by mouth daily. 90 tablet 3  . sertraline (ZOLOFT) 25 MG tablet Take 1 tablet (25 mg total) by mouth daily. 30 tablet 1  . spironolactone (ALDACTONE) 25 MG tablet Take 1 tablet (25 mg total) by mouth at bedtime. 90 tablet 3  . zolpidem (AMBIEN) 5 MG tablet Take 5 mg by mouth at bedtime as needed for sleep.     No current facility-administered medications for this encounter.    Allergies:   Clindamycin/lincomycin, Dramamine ii [meclizine hcl], Pneumococcal vaccines, and Sulfonamide derivatives   Social History:  The patient  reports that she quit smoking about 42 years ago. Her smoking use included cigarettes. She has a 10.00 pack-year smoking history. She has never used smokeless tobacco. She reports current alcohol use of about 14.0 standard drinks of alcohol per week. She reports that she does not use drugs.   Family History:  The patient's family history includes Heart attack in her father; Heart disease in her father; Prostate cancer in her father; Uterine cancer in her mother.   ROS:  Please see the history of present illness.   All other systems are personally reviewed and negative.   Exam:  BP (!) 90/50   Pulse 91   Wt 58.5 kg (129 lb)   SpO2 100%   BMI 22.32 kg/m  General: NAD Neck: No JVD, no thyromegaly or thyroid nodule.   Lungs: Clear to auscultation bilaterally with normal respiratory effort. CV: Nondisplaced PMI.  Heart irregular S1/S2, no S3/S4, no murmur.  No peripheral edema.  No carotid bruit.  Normal pedal pulses.  Abdomen: Soft, nontender, no hepatosplenomegaly, no distention.  Skin: Intact without lesions or rashes.  Neurologic: Alert and oriented x 3.  Psych: Normal affect. Extremities: No clubbing or cyanosis.  HEENT: Normal.  Recent Labs: 05/09/2019: B Natriuretic Peptide 2,241.2 08/21/2019: TSH 4.018 04/14/2020: ALT 28; BUN 17; Creatinine, Ser 1.32; Hemoglobin 12.2; Platelets 144; Potassium 4.0; Sodium 134  Personally reviewed   Wt Readings from Last 3 Encounters:  04/14/20 58.5 kg (129 lb)  03/17/20 58.1 kg (128 lb)  02/25/20 59 kg (130 lb)    ASSESSMENT AND PLAN:  1. Chronic systolic CHF: EF 0000000 with diffuse hypokinesis on 12/16 echo.  Nonischemic cardiomyopathy, probably related to Adriamycin with ovarian cancer treatment.  Cardiac MRI in 1/17 showed EF 29% with normal RV and septal-lateral dyssynchrony.  There was a non-cardiac pattern of LGE in the septum, cannot rule out prior myocarditis based on this pattern.  She has a Medtronic CRT-D device. Echo 4/19 with EF 30-35%, then echo in 8/20 showed EF up to 45-50%.  TEE in 3/21 showed EF 30-35%.  Echo in 11/21 showed EF 25-30% with mild LV dilation, global hypokinesis with septal-lateral dyssynchrony, moderate MR, moderately decreased RV systolic function with normal RV size, IVC normal.  NYHA class II-III symptoms currently, she is not volume overloaded by exam or Optivol.  She has lost effective BiV pacing in atrial flutter and she had significant septal-lateral dyssynchrony by echo in 11/21.   - Continue Toprol XL 25 daily.   - Continue spironolactone 25 daily. BMET today.   - She will stay off Entresto given syncope and lightheadedness while taking it.   - Continue Lasix 40 qam/20 qpm.  BMET today.  - Continue Farxiga 10 mg daily.   - She needs to get back into NSR to allow effective BiV pacing.  2. Atrial arrhythmias: She needs to maintain NSR as she loses BiV pacing when she goes into atrial arrhythmias, triggering CHF exacerbation.  She had atrial fibrillation ablation in 4/21 but has been back in atrial flutter since 11/21.  She is now back on amiodarone => LFTs were elevated mildly on amiodarone in the past but abdominal US did not show liver abnormalities.  Most recent LFTs in 11/21 were normal.  I think that her QT interval has been too long for Tikosyn.   - She has been reloaded on amiodarone and is now taking it 200 mg daily.  Check LFTs today.  - Continue apixaban, she has not missed doses.  - Plan for DCCV in the next week or so.  We discussed risks/benefits and she agrees to procedure.   3. Hypothyroidism: Suspect related to prior amiodarone use.   - Continue Levoxyl.  4. Elevated LFTs: Mild elevation in past, resolved in 11/21.  Abdominal US showed unremarkable liver.  ?Related to amiodarone.  She is now back on amiodarone.  She has seen GI.  - Check LFTs again today, will need to follow closely.  5. CAD: Calcification noted in coronaries with pulmonary vein CT done prior to afib ablation.  - Continue atorvastatin 40 daily, good lipids in 8/21.  - No ASA given apixaban use.   Followup in 1 month.   Signed, [name], MD  04/14/2020  [facility] [address] [facility] [place] [address] [phone] (office) [phone] (fax)

## 2020-04-14 NOTE — Patient Instructions (Signed)
Labs done today. We will contact you only if your labs are abnormal.  No medication changes were made. Please continue all current medications as prescribed.  Your physician has recommended that you have a Cardioversion (DCCV). Electrical Cardioversion uses a jolt of electricity to your heart either through paddles or wired patches attached to your chest. This is a controlled, usually prescheduled, procedure. Defibrillation is done under light anesthesia in the hospital, and you usually go home the day of the procedure. This is done to get your heart back into a normal rhythm. You are not awake for the procedure. Please see the instruction sheet given to you today.   Your physician recommends that you schedule a follow-up appointment in: 1 month  If you have any questions or concerns before your next appointment please send [place] a message through [place] or call our office at [phone].    TO LEAVE A MESSAGE FOR THE NURSE SELECT OPTION 2, PLEASE LEAVE A MESSAGE INCLUDING:  YOUR NAME  DATE OF BIRTH  CALL BACK NUMBER  REASON FOR CALL**this is important as we prioritize the call backs  YOU WILL RECEIVE A CALL BACK THE SAME DAY AS LONG AS YOU CALL BEFORE 4:00 PM   At the [facility], you and your health needs are our priority. As part of our continuing mission to provide you with exceptional heart care, we have created designated Provider Care Teams. These Care Teams include your primary Cardiologist (physician) and Advanced Practice Providers (APPs- Physician Assistants and Nurse Practitioners) who all work together to provide you with the care you need, when you need it.   You may see any of the following providers on your designated Care Team at your next follow up:  Dr Arvilla Meres  Dr Carron Curie, NP  Robbie Lis, [place]  Karle Plumber, PharmD   Please be sure to bring in all your medications bottles to every appointment.

## 2020-04-14 NOTE — H&P (View-Only) (Signed)
Date:  04/14/2020   ID:  [name], DOB 04-Nov-1935, MRN [id number]  Provider location: [facility] Type of Visit: Established patient   PCP:  [name], MD  Cardiologist:  Dr. Aundra Dubin   History of Present Illness: Cynthia Frank is a 84 y.o. female who has a history of chronic systolic CHF/nonischemic cardiomyopathy, paroxysmal atrial fibrillation, and prior ovarian and colon cancers. She has had a cardiomyopathy known for > 15 years now. Cardiac cath at diagnosis showed mild nonobstructive disease.  EF has been persistently low.  Cause has been thought to be Adriamycin; she received this as part of her ovarian cancer treatment. She has had paroxysmal atrial fibrillation and has remained in NSR with amiodarone use.  No recent atrial fibrillation symptoms, typically feels prolonged palpitations thought to be atrial fibrillation 2-3 times/year.   She had had dyspnea with moderate to heavy exertion for 4-5 years prior to initial appt.  However, just before her initial appt, her symptoms had worsened.  She developed shortness of breath walking short distances on flat ground.  Lasix was increased to 40 mg bid and she lost weight and felt better.     Cardiac MRI was done in 1/17, showing EF 29% with normal RV.  Septal-lateral dyssynchrony was present and there was a non-coronary pattern of LGE in the septum.  LFTs were noted to be mildly elevated.  Amiodarone was decreased to 100 mg daily.   She had Medtronic CRT-D placed in 3/17. Repeat echo 8/17 showed EF 35% with diffuse hypokinesis, normal RV.  Echo in 4/19 showed EF 30-35%, mild AI.   On 11/12/17, she stood up, got lightheaded and passed out briefly with a fall.  Her ICD did not discharge.  She went to the ER and was found to have right C5 transverse process fracture.  She says that she has had lightheadedness with standing for years, comes and goes.  I decreased her Toprol XL.   In 8/20, she  had an episode of lightheadedness with standing followed by syncope and fall with right hip fracture.  She was admitted, had repair of right hip.  She was noted to be orthostatic and Entresto was stopped.  Echo in 8/20 showed EF 45-50% with normal RV systolic function.  In 1/21, patient was in atrial tachycardia and had been in it for several weeks.  She was volume overloaded.  Lasix was increased and she was brought into the hospital for DCCV, but she had converted to NSR on her own.    In 4/21, she had atrial fibrillation ablation.  TEE done in 4/21 showed EF 30-35% with global HK.  She is now off amiodarone.   Echo in 11/21 showed EF 25-30% with mild LV dilation, global hypokinesis with septal-lateral dyssynchrony, moderate MR, moderately decreased RV systolic function with normal RV size, IVC normal.   She returns today for followup of CHF.  She has been back in atrial fibrillation/flutter since early November based on device interrogation; she remains in atrial flutter today.  She is minimally BiV pacing.  She is short of breath walking up stairs or inclines and with other forms of moderate exertion.  Not short of breath walking short distances on flat ground.  No chest pain.  No orthopnea/PND.  No lightheadedness.  Weight is stable. She is now back on amiodarone.   Medtronic device interrogation: She is back in atrial fibrillation/flutter since earlier in 11/21.  4% BiV pacing now  that she is in atrial fibrillation.  Stable thoracic impedance.   ECG (personally reviewed): Atrial flutter with V-pacing  Labs (1/17): K 4.2, creatinine 1.14, BNP 855 Labs (3/17): SPEP negative, BNP 341, K 4.5, creatinine 1.27, AST 62, ALT 74, TSH normal with low free T3 and normal free T4, HCT 46.5 Labs (4/17): AST 64, ALT 67, BNP 278, K 3.6, creatinine 1.21 Labs (5/17): K 3.4, creatinine 1.39, AST 53, ALT 61, HBV/HCV negative, TSH mildly elevated, BNP 306 Labs (6/17): K 4.4, creatinine 1.36, free T4 normal,  free T3 mildly decreased, AST 50, ALT 53. Labs (7/17): ANA negative, ASMA positive Labs (8/17): AST 45, ALT 37, K 3.8, creatinine 1.19, TSH normal Labs (2/18): TSH normal, LDL 47, LFTs, normal Labs (3/18): K normal, creatinine 1.23.  Labs (7/18): K 4, creatinine 1.32, LFTs normal Labs (7/19): K 3.8, creatinine 1.25, hgb 11.6 Labs (8/19): TSH mildly elevated, K 4.2, creatinine 1.22 Labs (10/19): K 4.1, creatinine 1.23, LFTs normal, TSH mildly elevated with normal free T3 and free T4.  Labs (1/20): K 3.5, creatinine 1.15, TSH elevated, free T3 and free T4 low, AST 63, ALT 51 Labs (5/20): K 3.8, creatinine 1.22, TSH normal, AST 49, ALT 43, tbili 0.9 Labs (11/20): K 4, creatinine 1.15  Labs (12/20): K 4.1, creatinine 1.25, TSH normal, AST 51, ALT 50 Labs (1/21): AST 145, ALT 107, tbili 1.8, TSH normal Labs (2/21): K 3.8, creatinine 1.38 Labs (3/21): AST 50, ALT 46 Labs (4/21): K 3.9, creatinine 0.96 Labs (5/21): K 4.5, creatinine 1.11, hgb 13.1, plts 141, AST 65, ALT 53 Labs (8/21): LDL 52, K 3.8, creatinine 1.11, AST 67, ALT 52 Labs (11/21): LFTs normal Labs (12/21): K 3.5, creatinine 1.33  PMH: 1. Chronic systolic CHF: Nonischemic cardiomyopathy, thought to be related to adriamycin that she had for treatment of ovarian cancer.  NICM diagnosed ~ 2001.  - LHC ~ 2001 with 30% stenosis D1.   - Echo (2013) EF 25-30%.  - Echo (10/14) with EF 25-30%. - Echo (12/16) with EF 20-25%, mild LV dilation, restrictive diastolic function, moderate MR, severe LAE. - Cardiac MRI (1/17): Moderately dilated LV with EF 29%. Diffuse hypokinesis looking worse in the septal wall. Septal-lateral dyssynchrony. Normal RV size and systolic function.  Moderate MR with tethered posterior leaflet.  Mid-wall LGE in the basal to mid septum. This is not a coronary disease pattern. Could be suggestive of prior myocarditis. - Medtronic CRT-D placed 3/17.  - Echo (8/17): EF 35%, diffuse hypokinesis, normal RV size and  systolic function, moderate MR.   - Echo (4/19): EF 30-35%, mild AI.  - Echo (8/20): EF 45-50%, diffuse hypokinesis, normal RV size and systolic function. - TEE (4/21): EF 30%, global hypokinesis.  - Echo (11/21): EF 25-30% with mild LV dilation, global hypokinesis with septal-lateral dyssynchrony, moderate MR, moderately decreased RV systolic function with normal RV size, IVC normal.  2. Atrial fibrillation/atrial tachycardia: Paroxysmal.  - 4/21 atrial fibrillation ablation.  3. HTN 4. Ovarian cancer: 1985, s/p surgery. Received Adriamycin-based chemotherapy.  5. Colon cancer: 2007, s/p surgery.  6. PVCs 7. GERD 8. PFTs (8/16) without significant abnormality. 9. Prior syncope 10. Elevated transaminases: Uncertain etiology.  - Abdominal US (12/20): Normal-appearing liver.   Current Outpatient Medications  Medication Sig Dispense Refill  . acetaminophen (TYLENOL) 500 MG tablet Take 1,000 mg by mouth every 6 (six) hours as needed for moderate pain or headache (for pain.).    . amiodarone (PACERONE) 200 MG tablet Take 200 mg by mouth   daily.    . atorvastatin (LIPITOR) 40 MG tablet TAKE 1 TABLET(40 MG) BY MOUTH DAILY AT 6 PM (Patient taking differently: Take 40 mg by mouth at bedtime.) 90 tablet 3  . Coenzyme Q10 (COQ10) 100 MG CAPS Take 100 mg by mouth daily.    Marland Kitchen conjugated estrogens (PREMARIN) vaginal cream Place 1 Applicatorful vaginally daily as needed (dyspareunia (Use as directed)).     . dapagliflozin propanediol (FARXIGA) 10 MG TABS tablet Take 1 tablet (10 mg total) by mouth daily before breakfast. 30 tablet 6  . diclofenac Sodium (VOLTAREN) 1 % GEL Apply 2 g topically daily.    Marland Kitchen ELIQUIS 2.5 MG TABS tablet TAKE 1 TABLET(2.5 MG) BY MOUTH TWICE DAILY (Patient taking differently: Take 2.5 mg by mouth 2 (two) times daily.) 180 tablet 2  . fluticasone (FLONASE) 50 MCG/ACT nasal spray Place 2 sprays into both nostrils 2 (two) times daily.     . furosemide (LASIX) 40 MG tablet Take 20-40  mg by mouth See admin instructions. Take 40 mg by mouth every morning and 20 mg by mouth every evening    . ketoconazole (NIZORAL) 2 % shampoo Apply 1 application topically daily as needed (Psoriasis).    Marland Kitchen levothyroxine (SYNTHROID) 25 MCG tablet TAKE 1 TABLET(25 MCG) BY MOUTH DAILY BEFORE BREAKFAST (Patient taking differently: Take 25 mcg by mouth daily before breakfast.) 30 tablet 6  . Magnesium Oxide 500 MG CAPS Take 1,000 mg by mouth 2 (two) times daily.     . metoprolol succinate (TOPROL-XL) 25 MG 24 hr tablet Take 1 tablet (25 mg total) by mouth at bedtime. 90 tablet 3  . potassium chloride SA (KLOR-CON) 20 MEQ tablet Take 1 tablet (20 mEq total) by mouth daily. 90 tablet 3  . sertraline (ZOLOFT) 25 MG tablet Take 1 tablet (25 mg total) by mouth daily. 30 tablet 1  . spironolactone (ALDACTONE) 25 MG tablet Take 1 tablet (25 mg total) by mouth at bedtime. 90 tablet 3  . zolpidem (AMBIEN) 5 MG tablet Take 5 mg by mouth at bedtime as needed for sleep.     No current facility-administered medications for this encounter.    Allergies:   Clindamycin/lincomycin, Dramamine ii [meclizine hcl], Pneumococcal vaccines, and Sulfonamide derivatives   Social History:  The patient  reports that she quit smoking about 42 years ago. Her smoking use included cigarettes. She has a 10.00 pack-year smoking history. She has never used smokeless tobacco. She reports current alcohol use of about 14.0 standard drinks of alcohol per week. She reports that she does not use drugs.   Family History:  The patient's family history includes Heart attack in her father; Heart disease in her father; Prostate cancer in her father; Uterine cancer in her mother.   ROS:  Please see the history of present illness.   All other systems are personally reviewed and negative.   Exam:  BP (!) 90/50   Pulse 91   Wt 58.5 kg (129 lb)   SpO2 100%   BMI 22.32 kg/m  General: NAD Neck: No JVD, no thyromegaly or thyroid nodule.   Lungs: Clear to auscultation bilaterally with normal respiratory effort. CV: Nondisplaced PMI.  Heart irregular S1/S2, no S3/S4, no murmur.  No peripheral edema.  No carotid bruit.  Normal pedal pulses.  Abdomen: Soft, nontender, no hepatosplenomegaly, no distention.  Skin: Intact without lesions or rashes.  Neurologic: Alert and oriented x 3.  Psych: Normal affect. Extremities: No clubbing or cyanosis.  HEENT: Normal.  Recent Labs: 05/09/2019: B Natriuretic Peptide 2,241.2 08/21/2019: TSH 4.018 04/14/2020: ALT 28; BUN 17; Creatinine, Ser 1.32; Hemoglobin 12.2; Platelets 144; Potassium 4.0; Sodium 134  Personally reviewed   Wt Readings from Last 3 Encounters:  04/14/20 58.5 kg (129 lb)  03/17/20 58.1 kg (128 lb)  02/25/20 59 kg (130 lb)    ASSESSMENT AND PLAN:  1. Chronic systolic CHF: EF 0000000 with diffuse hypokinesis on 12/16 echo.  Nonischemic cardiomyopathy, probably related to Adriamycin with ovarian cancer treatment.  Cardiac MRI in 1/17 showed EF 29% with normal RV and septal-lateral dyssynchrony.  There was a non-cardiac pattern of LGE in the septum, cannot rule out prior myocarditis based on this pattern.  She has a Medtronic CRT-D device. Echo 4/19 with EF 30-35%, then echo in 8/20 showed EF up to 45-50%.  TEE in 3/21 showed EF 30-35%.  Echo in 11/21 showed EF 25-30% with mild LV dilation, global hypokinesis with septal-lateral dyssynchrony, moderate MR, moderately decreased RV systolic function with normal RV size, IVC normal.  NYHA class II-III symptoms currently, she is not volume overloaded by exam or Optivol.  She has lost effective BiV pacing in atrial flutter and she had significant septal-lateral dyssynchrony by echo in 11/21.   - Continue Toprol XL 25 daily.   - Continue spironolactone 25 daily. BMET today.   - She will stay off Entresto given syncope and lightheadedness while taking it.   - Continue Lasix 40 qam/20 qpm.  BMET today.  - Continue Farxiga 10 mg daily.   - She needs to get back into NSR to allow effective BiV pacing.  2. Atrial arrhythmias: She needs to maintain NSR as she loses BiV pacing when she goes into atrial arrhythmias, triggering CHF exacerbation.  She had atrial fibrillation ablation in 4/21 but has been back in atrial flutter since 11/21.  She is now back on amiodarone => LFTs were elevated mildly on amiodarone in the past but abdominal US did not show liver abnormalities.  Most recent LFTs in 11/21 were normal.  I think that her QT interval has been too long for Tikosyn.   - She has been reloaded on amiodarone and is now taking it 200 mg daily.  Check LFTs today.  - Continue apixaban, she has not missed doses.  - Plan for DCCV in the next week or so.  We discussed risks/benefits and she agrees to procedure.   3. Hypothyroidism: Suspect related to prior amiodarone use.   - Continue Levoxyl.  4. Elevated LFTs: Mild elevation in past, resolved in 11/21.  Abdominal US showed unremarkable liver.  ?Related to amiodarone.  She is now back on amiodarone.  She has seen GI.  - Check LFTs again today, will need to follow closely.  5. CAD: Calcification noted in coronaries with pulmonary vein CT done prior to afib ablation.  - Continue atorvastatin 40 daily, good lipids in 8/21.  - No ASA given apixaban use.   Followup in 1 month.   Signed, [name], MD  04/14/2020  [facility] [address] [facility] [place] [address] [phone] (office) [phone] (fax)

## 2020-04-16 ENCOUNTER — Other Ambulatory Visit (HOSPITAL_COMMUNITY)
Admission: RE | Admit: 2020-04-16 | Discharge: 2020-04-16 | Disposition: A | Discharge status: Home / Self Care | Payer: Medicare Other | Source: Ambulatory Visit | Attending: Cardiology | Admitting: Cardiology

## 2020-04-16 ENCOUNTER — Other Ambulatory Visit (HOSPITAL_COMMUNITY): Payer: Self-pay

## 2020-04-16 DIAGNOSIS — Z20822 Contact with and (suspected) exposure to covid-19: Secondary | ICD-10-CM | POA: Diagnosis not present

## 2020-04-16 DIAGNOSIS — Z01812 Encounter for preprocedural laboratory examination: Secondary | ICD-10-CM | POA: Diagnosis not present

## 2020-04-16 LAB — SARS CORONAVIRUS 2 (TAT 6-24 HRS): SARS Coronavirus 2: NEGATIVE

## 2020-04-19 NOTE — Progress Notes (Signed)
Confirmed with patient for appointment for cardioversion. Pt denied any flu or cold like symptoms and denied being around any one who is sick. Pt confirmed that she had quarantined.

## 2020-04-20 ENCOUNTER — Other Ambulatory Visit: Payer: Self-pay

## 2020-04-20 ENCOUNTER — Encounter (HOSPITAL_COMMUNITY): Payer: Self-pay | Admitting: Cardiology

## 2020-04-20 ENCOUNTER — Ambulatory Visit (HOSPITAL_COMMUNITY): Payer: Medicare Other | Admitting: Certified Registered Nurse Anesthetist

## 2020-04-20 ENCOUNTER — Ambulatory Visit (INDEPENDENT_AMBULATORY_CARE_PROVIDER_SITE_OTHER): Payer: Medicare Other

## 2020-04-20 ENCOUNTER — Ambulatory Visit (HOSPITAL_COMMUNITY)
Admission: RE | Admit: 2020-04-20 | Discharge: 2020-04-20 | Disposition: A | Discharge status: Home / Self Care | Payer: Medicare Other | Attending: Cardiology | Admitting: Cardiology

## 2020-04-20 ENCOUNTER — Encounter (HOSPITAL_COMMUNITY)
Admission: RE | Disposition: A | Discharge status: Home / Self Care | Payer: Self-pay | Source: Home / Self Care | Attending: Cardiology

## 2020-04-20 DIAGNOSIS — Z95810 Presence of automatic (implantable) cardiac defibrillator: Secondary | ICD-10-CM

## 2020-04-20 DIAGNOSIS — Z79890 Hormone replacement therapy (postmenopausal): Secondary | ICD-10-CM | POA: Diagnosis not present

## 2020-04-20 DIAGNOSIS — Z87891 Personal history of nicotine dependence: Secondary | ICD-10-CM | POA: Insufficient documentation

## 2020-04-20 DIAGNOSIS — I2510 Atherosclerotic heart disease of native coronary artery without angina pectoris: Secondary | ICD-10-CM | POA: Insufficient documentation

## 2020-04-20 DIAGNOSIS — Z7901 Long term (current) use of anticoagulants: Secondary | ICD-10-CM | POA: Insufficient documentation

## 2020-04-20 DIAGNOSIS — E039 Hypothyroidism, unspecified: Secondary | ICD-10-CM | POA: Diagnosis not present

## 2020-04-20 DIAGNOSIS — I428 Other cardiomyopathies: Secondary | ICD-10-CM | POA: Diagnosis not present

## 2020-04-20 DIAGNOSIS — Z882 Allergy status to sulfonamides status: Secondary | ICD-10-CM | POA: Insufficient documentation

## 2020-04-20 DIAGNOSIS — Z7984 Long term (current) use of oral hypoglycemic drugs: Secondary | ICD-10-CM | POA: Diagnosis not present

## 2020-04-20 DIAGNOSIS — I5022 Chronic systolic (congestive) heart failure: Secondary | ICD-10-CM

## 2020-04-20 DIAGNOSIS — Z881 Allergy status to other antibiotic agents status: Secondary | ICD-10-CM | POA: Insufficient documentation

## 2020-04-20 DIAGNOSIS — I480 Paroxysmal atrial fibrillation: Secondary | ICD-10-CM | POA: Diagnosis not present

## 2020-04-20 DIAGNOSIS — I4892 Unspecified atrial flutter: Secondary | ICD-10-CM | POA: Diagnosis not present

## 2020-04-20 DIAGNOSIS — Z79899 Other long term (current) drug therapy: Secondary | ICD-10-CM | POA: Diagnosis not present

## 2020-04-20 DIAGNOSIS — I110 Hypertensive heart disease with heart failure: Secondary | ICD-10-CM | POA: Diagnosis not present

## 2020-04-20 DIAGNOSIS — I4891 Unspecified atrial fibrillation: Secondary | ICD-10-CM | POA: Diagnosis not present

## 2020-04-20 HISTORY — PX: CARDIOVERSION: SHX1299

## 2020-04-20 LAB — GLUCOSE, CAPILLARY: Glucose-Capillary: 101 mg/dL — ABNORMAL HIGH (ref 70–99)

## 2020-04-20 SURGERY — CARDIOVERSION
Anesthesia: General

## 2020-04-20 MED ORDER — LIDOCAINE 2% (20 MG/ML) 5 ML SYRINGE
INTRAMUSCULAR | Status: DC | PRN
  Administered 2020-04-20: 60 mg via INTRAVENOUS

## 2020-04-20 MED ORDER — PROPOFOL 10 MG/ML IV BOLUS
INTRAVENOUS | Status: DC | PRN
  Administered 2020-04-20: 10 mg via INTRAVENOUS
  Administered 2020-04-20: 30 mg via INTRAVENOUS

## 2020-04-20 MED ORDER — ETOMIDATE 2 MG/ML IV SOLN
INTRAVENOUS | Status: DC | PRN
  Administered 2020-04-20: 8 mg via INTRAVENOUS

## 2020-04-20 NOTE — Anesthesia Preprocedure Evaluation (Signed)
Anesthesia Evaluation  Patient identified by MRN, date of birth, ID band Patient awake    Reviewed: Allergy & Precautions, NPO status , Patient's Chart, lab work & pertinent test results  History of Anesthesia Complications Negative for: history of anesthetic complications  Airway Mallampati: II  TM Distance: >3 FB Neck ROM: Full    Dental   Pulmonary neg pulmonary ROS, former smoker,    Pulmonary exam normal        Cardiovascular hypertension, + CAD (nonobstructive) and +CHF  Normal cardiovascular exam+ dysrhythmias Atrial Fibrillation + Cardiac Defibrillator      Neuro/Psych negative neurological ROS  negative psych ROS   GI/Hepatic Neg liver ROS, GERD  ,  Endo/Other  negative endocrine ROS  Renal/GU Renal InsufficiencyRenal disease (Cr 1.32)  negative genitourinary   Musculoskeletal negative musculoskeletal ROS (+)   Abdominal   Peds  Hematology negative hematology ROS (+)   Anesthesia Other Findings  Echo 03/17/20: EF 25-30%, moderately reduced RV function, est RVSP 41, mod MR, mild-mod TR  Reproductive/Obstetrics                             Anesthesia Physical Anesthesia Plan  ASA: IV  Anesthesia Plan: General   Post-op Pain Management:    Induction: Intravenous  PONV Risk Score and Plan: 3 and TIVA and Treatment may vary due to age or medical condition  Airway Management Planned: Mask  Additional Equipment: None  Intra-op Plan:   Post-operative Plan:   Informed Consent: I have reviewed the patients History and Physical, chart, labs and discussed the procedure including the risks, benefits and alternatives for the proposed anesthesia with the patient or authorized representative who has indicated his/her understanding and acceptance.       Plan Discussed with:   Anesthesia Plan Comments:         Anesthesia Quick Evaluation

## 2020-04-20 NOTE — Anesthesia Postprocedure Evaluation (Signed)
Anesthesia Post Note  Patient: Cynthia Frank  Procedure(s) Performed: CARDIOVERSION (N/A )     Patient location during evaluation: PACU Anesthesia Type: General Level of consciousness: awake and alert and oriented Pain management: pain level controlled Vital Signs Assessment: post-procedure vital signs reviewed and stable Respiratory status: spontaneous breathing, nonlabored ventilation and respiratory function stable Cardiovascular status: blood pressure returned to baseline Postop Assessment: no apparent nausea or vomiting Anesthetic complications: no   No complications documented.  Last Vitals:  Vitals:   04/20/20 1235 04/20/20 1329  BP: 96/66 106/77  Pulse: 97 62  Resp: 10 17  Temp: (!) 35.1 C (!) 36.1 C  SpO2: 96% 98%    Last Pain:  Vitals:   04/20/20 1329  TempSrc: Axillary  PainSc: Asleep                 Kaylyn Layer

## 2020-04-20 NOTE — Discharge Instructions (Signed)
If colonoscopy is scheduled for 05/04/20, need to reschedule.  Need to continue Eliquis without missing doses for a month after cardioversion, would reschedule the colonoscopy to February.    Electrical Cardioversion Electrical cardioversion is the delivery of a jolt of electricity to restore a normal rhythm to the heart. A rhythm that is too fast or is not regular keeps the heart from pumping well. In this procedure, sticky patches or metal paddles are placed on the chest to deliver electricity to the heart from a device. This procedure may be done in an emergency if:  There is low or no blood pressure as a result of the heart rhythm.  Normal rhythm must be restored as fast as possible to protect the brain and heart from further damage.  It may save a life. This may also be a scheduled procedure for irregular or fast heart rhythms that are not immediately life-threatening. Tell a health care provider about:  Any allergies you have.  All medicines you are taking, including vitamins, herbs, eye drops, creams, and over-the-counter medicines.  Any problems you or family members have had with anesthetic medicines.  Any blood disorders you have.  Any surgeries you have had.  Any medical conditions you have.  Whether you are pregnant or may be pregnant. What are the risks? Generally, this is a safe procedure. However, problems may occur, including:  Allergic reactions to medicines.  A blood clot that breaks free and travels to other parts of your body.  The possible return of an abnormal heart rhythm within hours or days after the procedure.  Your heart stopping (cardiac arrest). This is rare. What happens before the procedure? Medicines  Your health care provider may have you start taking: ? Blood-thinning medicines (anticoagulants) so your blood does not clot as easily. ? Medicines to help stabilize your heart rate and rhythm.  Ask your health care provider about: ? Changing  or stopping your regular medicines. This is especially important if you are taking diabetes medicines or blood thinners. ? Taking medicines such as aspirin and ibuprofen. These medicines can thin your blood. Do not take these medicines unless your health care provider tells you to take them. ? Taking over-the-counter medicines, vitamins, herbs, and supplements. General instructions  Follow instructions from your health care provider about eating or drinking restrictions.  Plan to have someone take you home from the hospital or clinic.  If you will be going home right after the procedure, plan to have someone with you for 24 hours.  Ask your health care provider what steps will be taken to help prevent infection. These may include washing your skin with a germ-killing soap. What happens during the procedure?   An IV will be inserted into one of your veins.  Sticky patches (electrodes) or metal paddles may be placed on your chest.  You will be given a medicine to help you relax (sedative).  An electrical shock will be delivered. The procedure may vary among health care providers and hospitals. What can I expect after the procedure?  Your blood pressure, heart rate, breathing rate, and blood oxygen level will be monitored until you leave the hospital or clinic.  Your heart rhythm will be watched to make sure it does not change.  You may have some redness on the skin where the shocks were given. Follow these instructions at home:  Do not drive for 24 hours if you were given a sedative during your procedure.  Take over-the-counter and prescription medicines  only as told by your health care provider.  Ask your health care provider how to check your pulse. Check it often.  Rest for 48 hours after the procedure or as told by your health care provider.  Avoid or limit your caffeine use as told by your health care provider.  Keep all follow-up visits as told by your health care  provider. This is important. Contact a health care provider if:  You feel like your heart is beating too quickly or your pulse is not regular.  You have a serious muscle cramp that does not go away. Get help right away if:  You have discomfort in your chest.  You are dizzy or you feel faint.  You have trouble breathing or you are short of breath.  Your speech is slurred.  You have trouble moving an arm or leg on one side of your body.  Your fingers or toes turn cold or blue. Summary  Electrical cardioversion is the delivery of a jolt of electricity to restore a normal rhythm to the heart.  This procedure may be done right away in an emergency or may be a scheduled procedure if the condition is not an emergency.  Generally, this is a safe procedure.  After the procedure, check your pulse often as told by your health care provider. This information is not intended to replace advice given to you by your health care provider. Make sure you discuss any questions you have with your health care provider. Document Revised: 11/05/2018 Document Reviewed: 11/05/2018 Elsevier Patient Education  [employer].

## 2020-04-20 NOTE — Procedures (Signed)
Electrical Cardioversion Procedure Note Cynthia Frank [id number] 17-Jan-1936  Procedure: Electrical Cardioversion Indications:  Atrial Fibrillation  Procedure Details Consent: Risks of procedure as well as the alternatives and risks of each were explained to the (patient/caregiver).  Consent for procedure obtained. Time Out: Verified patient identification, verified procedure, site/side was marked, verified correct patient position, special equipment/implants available, medications/allergies/relevent history reviewed, required imaging and test results available.  Performed  Patient placed on cardiac monitor, pulse oximetry, supplemental oxygen as necessary.  Sedation given: Per anesthesiology Pacer pads placed anterior and posterior chest.  Cardioverted 1 time(s).  Cardioverted at 200J.  Evaluation Findings: Post procedure EKG shows: NSR Complications: None Patient did tolerate procedure well.   Cynthia Frank 04/20/2020, 1:20 PM

## 2020-04-20 NOTE — Transfer of Care (Signed)
Immediate Anesthesia Transfer of Care Note  Patient: Cynthia Frank  Procedure(s) Performed: CARDIOVERSION (N/A )  Patient Location: Endoscopy Unit  Anesthesia Type:General  Level of Consciousness: drowsy and patient cooperative  Airway & Oxygen Therapy: Patient Spontanous Breathing and Patient connected to face mask oxygen  Post-op Assessment: Report given to RN and Post -op Vital signs reviewed and stable  Post vital signs: Reviewed and stable  Last Vitals:  Vitals Value Taken Time  BP    Temp    Pulse    Resp    SpO2      Last Pain:  Vitals:   04/20/20 1235  TempSrc: Temporal  PainSc: 0-No pain         Complications: No complications documented.

## 2020-04-20 NOTE — Interval H&P Note (Signed)
History and Physical Interval Note:  04/20/2020 1:08 PM  Cynthia Frank  has presented today for surgery, with the diagnosis of A-FIB.  The various methods of treatment have been discussed with the patient and family. After consideration of risks, benefits and other options for treatment, the patient has consented to  Procedure(s): CARDIOVERSION (N/A) as a surgical intervention.  The patient's history has been reviewed, patient examined, no change in status, stable for surgery.  I have reviewed the patient's chart and labs.  Questions were answered to the patient's satisfaction.     [name] [employer]

## 2020-04-21 ENCOUNTER — Telehealth: Payer: Self-pay | Admitting: *Deleted

## 2020-04-21 NOTE — Telephone Encounter (Signed)
I spoke with the patient's husband. She has been rescheduled to 06/22/20 at [facility] at 12:00.  Pre-visit resch for 06/02/20 and COVID screen on 06/18/20.  See notes 02/28/20 on Eliquis hold.  Husband verbalized understanding of all appt dates and times.     Lum Babe.  Now have an opening on 1/17 for hospital case.

## 2020-04-21 NOTE — Telephone Encounter (Signed)
Hi Cynthia Frank,  Patient is canceling colonoscopy @ [facility]  05/04/2020 due to cardioversion yesterday and her cardiologists  wants her to wait one month.  Can you please ssist with getting this canceled and  re-scheduled at [facility] along with pre-visit and Covid screen.  Thanks!

## 2020-04-22 NOTE — Progress Notes (Signed)
EPIC Encounter for ICM Monitoring  Patient Name: Cynthia Frank is a 85 y.o. female Date: 04/22/2020 Primary Care Physican: Myrlene Broker, MD Primary Cardiologist:[place] Electrophysiologist:Taylor Bi-V Pacing: 99.2% 12/28/2021Office Weight: 129lbs   Spoke with patient and reports feeling well at this time.  Denies fluid symptoms.   Cardioversion performed 04/20/2020.   She reports BP continues to be low but tolerating at this time.         Advised to record daily BP and call if she experiences symptoms.    Opitvol thoracic impedancenormal.   Report suggesting no AT/AF since 04/20/2020 cardioversion.  Prescribed: Furosemide40 mgTake 1 tablet (40 mg total) by mouth every morning AND 0.5 tablets (20 mg total) every evening.  Labs: 03/17/2020 Creatinine 1.28, BUN 20, Potassium 3.8, Sodium 133 12/12/2019 Creatinine 1.11, BUN 19, Potassium 3.8, Sodium 135, GFR 47-54 08/21/2019 Creatinine1.11, BUN18, Potassium4.5, Sodium132, GFR47-54 08/02/2019 Creatinine0.96, BUN20, Potassium3.9, Sodium136, HBZ16-96 06/17/2019 Creatinine1.21, BUN23, Potassium4.0, Sodium133, GFR42-49 05/20/2019 Creatinine1.38, BUN19, Potassium3.8, Sodium134, VEL38-10 05/09/2019 Creatinine1.52, BUN21, Potassium4.1, Sodium130, FBP10-25 A complete set of results can be found in Results Review.  Recommendations:No changes and encouraged to call if experiencing any fluid symptoms.  Follow-up plan: ICM clinic phone appointment on 05/26/2020. 91 day device clinic remote transmission1/03/2021.   EP/Cardiology Office Visits: 05/15/2020 with Dr Shirlee Latch. 05/29/2020 with Dr Elberta Fortis  Copy of ICM check sent to Dr.Taylor and Dr Launa Grill regarding rhythm since cardioversion.   3 month ICM trend: 04/22/2020.    1 Year ICM trend:       Karie Soda, RN 04/22/2020 11:05 AM

## 2020-04-28 ENCOUNTER — Ambulatory Visit (INDEPENDENT_AMBULATORY_CARE_PROVIDER_SITE_OTHER): Payer: Medicare Other

## 2020-04-28 DIAGNOSIS — I5022 Chronic systolic (congestive) heart failure: Secondary | ICD-10-CM | POA: Diagnosis not present

## 2020-04-28 DIAGNOSIS — I428 Other cardiomyopathies: Secondary | ICD-10-CM

## 2020-04-29 ENCOUNTER — Telehealth: Payer: Self-pay

## 2020-04-29 LAB — CUP PACEART REMOTE DEVICE CHECK
Battery Remaining Longevity: 13 mo
Battery Voltage: 2.87 V
Brady Statistic AP VP Percent: 11.53 %
Brady Statistic AP VS Percent: 0.97 %
Brady Statistic AS VP Percent: 59.08 %
Brady Statistic AS VS Percent: 28.42 %
Brady Statistic RA Percent Paced: 11.5 %
Brady Statistic RV Percent Paced: 69.92 %
Date Time Interrogation Session: 20220111023325
HighPow Impedance: 66 Ohm
Implantable Lead Implant Date: 20170328
Implantable Lead Implant Date: 20170328
Implantable Lead Implant Date: 20170328
Implantable Lead Location: 753858
Implantable Lead Location: 753859
Implantable Lead Location: 753860
Implantable Lead Model: 4598
Implantable Lead Model: 5076
Implantable Lead Model: 6935
Implantable Pulse Generator Implant Date: 20170328
Lead Channel Impedance Value: 304 Ohm
Lead Channel Impedance Value: 304 Ohm
Lead Channel Impedance Value: 304 Ohm
Lead Channel Impedance Value: 342 Ohm
Lead Channel Impedance Value: 399 Ohm
Lead Channel Impedance Value: 418 Ohm
Lead Channel Impedance Value: 456 Ohm
Lead Channel Impedance Value: 475 Ohm
Lead Channel Impedance Value: 513 Ohm
Lead Channel Impedance Value: 551 Ohm
Lead Channel Impedance Value: 665 Ohm
Lead Channel Impedance Value: 703 Ohm
Lead Channel Impedance Value: 703 Ohm
Lead Channel Pacing Threshold Amplitude: 0.875 V
Lead Channel Pacing Threshold Amplitude: 1 V
Lead Channel Pacing Threshold Amplitude: 1.125 V
Lead Channel Pacing Threshold Pulse Width: 0.4 ms
Lead Channel Pacing Threshold Pulse Width: 0.4 ms
Lead Channel Pacing Threshold Pulse Width: 0.4 ms
Lead Channel Sensing Intrinsic Amplitude: 0.875 mV
Lead Channel Sensing Intrinsic Amplitude: 0.875 mV
Lead Channel Sensing Intrinsic Amplitude: 12.25 mV
Lead Channel Sensing Intrinsic Amplitude: 12.25 mV
Lead Channel Setting Pacing Amplitude: 2.25 V
Lead Channel Setting Pacing Amplitude: 2.25 V
Lead Channel Setting Pacing Amplitude: 2.5 V
Lead Channel Setting Pacing Pulse Width: 0.4 ms
Lead Channel Setting Pacing Pulse Width: 0.4 ms
Lead Channel Setting Sensing Sensitivity: 0.3 mV

## 2020-04-29 MED ORDER — AMIODARONE HCL 200 MG PO TABS: 200.0000 mg | ORAL_TABLET | Freq: Two times a day (BID) | ORAL | 1 refills | Status: DC

## 2020-04-29 NOTE — Telephone Encounter (Signed)
Carelink alert received for AF burden 72.5%; EGMs suggest A. Flutter vs AT - ongoing; V rates controlled. History of AF s/p DCCV on 04/20/2020 and on apixaban and metoprolol. Biv-P 69.9%.   Patient reports she has felt well since the procedure and was unaware of AT. Denies fluid retention symptoms. Patient reports compliance with all medications including Amiodarone 200 mg daily, Eliquis 2.5 mg BID, Lasix 40 mg in AM and 20 mg in PM, Toprol- XL 25 mg at bedtime.  Advised I will forward to Dr. Lovena Le and Dr. Aundra Dubin. We will call if any changes are warranted. Patient verbalizes understanding.

## 2020-04-29 NOTE — Telephone Encounter (Signed)
Was just cardioverted, now appears to be back in flutter/fibrillation with poor BiV pacing.  Would like her to increase amiodarone to 200 mg bid x 2 wks then retry cardioversion unless Dr. Lovena Le has further recommendations.

## 2020-04-29 NOTE — Telephone Encounter (Signed)
Spoke w/pt, advised to increase Amio to 200 mg BID, advised will wait to here back from EP on any other recommendations or if we need to sch another dccv.

## 2020-04-30 ENCOUNTER — Other Ambulatory Visit (HOSPITAL_COMMUNITY): Payer: Medicare Other

## 2020-05-01 ENCOUNTER — Other Ambulatory Visit (HOSPITAL_COMMUNITY): Payer: Self-pay

## 2020-05-01 MED ORDER — AMIODARONE HCL 200 MG PO TABS: 200.0000 mg | ORAL_TABLET | Freq: Two times a day (BID) | ORAL | 1 refills | Status: DC

## 2020-05-01 MED ORDER — AMIODARONE HCL 200 MG PO TABS: 200.0000 mg | ORAL_TABLET | Freq: Every day | ORAL | 3 refills | Status: DC

## 2020-05-01 NOTE — Telephone Encounter (Signed)
Agree with repeat cardioversion on higher dose of amiodarone. Aren Pryde need clinic follow up.

## 2020-05-05 NOTE — Telephone Encounter (Signed)
That is reasonable. GT

## 2020-05-08 NOTE — Telephone Encounter (Signed)
Spoke to husband, pt in shower. Aware I will call back to discuss scheduling OV w/ Dr. Curt Bears next week.

## 2020-05-08 NOTE — Telephone Encounter (Signed)
Spoke to husband, pt is not at home currently. Scheduled pt to see Camnitz next Tuesday to further discuss & possibly schedule another DCCV. Husband is agreeable to plan and will inform wife.

## 2020-05-12 ENCOUNTER — Encounter: Payer: Self-pay | Admitting: Cardiology

## 2020-05-12 ENCOUNTER — Ambulatory Visit (INDEPENDENT_AMBULATORY_CARE_PROVIDER_SITE_OTHER): Payer: Medicare Other | Admitting: Cardiology

## 2020-05-12 ENCOUNTER — Other Ambulatory Visit: Payer: Self-pay

## 2020-05-12 VITALS — BP 88/60 | HR 90 | Ht 64.0 in | Wt 126.6 lb

## 2020-05-12 DIAGNOSIS — I4819 Other persistent atrial fibrillation: Secondary | ICD-10-CM | POA: Diagnosis not present

## 2020-05-12 DIAGNOSIS — Z01812 Encounter for preprocedural laboratory examination: Secondary | ICD-10-CM

## 2020-05-12 DIAGNOSIS — I480 Paroxysmal atrial fibrillation: Secondary | ICD-10-CM | POA: Diagnosis not present

## 2020-05-12 NOTE — Patient Instructions (Signed)
Medication Instructions:  Your physician recommends that you continue on your current medications as directed. Please refer to the Current Medication list given to you today.  *If you need a refill on your cardiac medications before your next appointment, please call your pharmacy*   Lab Work: None ordered If you have labs (blood work) drawn today and your tests are completely normal, you will receive your results only by: Marland Kitchen MyChart Message (if you have MyChart) OR . A paper copy in the mail If you have any lab test that is abnormal or we need to change your treatment, we will call you to review the results.   Testing/Procedures: Your physician has requested that you have cardiac CT. Cardiac computed tomography (CT) is a painless test that uses an x-ray machine to take clear, detailed pictures of your heart. Please follow instructions below located under "other instructions".   Your physician has recommended that you have an ablation. Catheter ablation is a medical procedure used to treat some cardiac arrhythmias (irregular heartbeats). During catheter ablation, a long, thin, flexible tube is put into a blood vessel in your groin (upper thigh), or neck. This tube is called an ablation catheter. It is then guided to your heart through the blood vessel. Radio frequency waves destroy small areas of heart tissue where abnormal heartbeats may cause an arrhythmia to start. Please follow instructions below located under "other instructions".    Follow-Up: At [facility], you and your health needs are our priority.  As part of our continuing mission to provide you with exceptional heart care, we have created designated Provider Care Teams.  These Care Teams include your primary Cardiologist (physician) and Advanced Practice Providers (APPs -  Physician Assistants and Nurse Practitioners) who all work together to provide you with the care you need, when you need it.  Your next appointment:   4  week(s) after your ablation   The format for your next appointment:   In Person  Provider:   You will follow up in the [facility] located at [facility]. Your provider will be: Roderic Palau, NP or Clint R. Fenton, PA-C    Thank you for choosing [facility]!!   Trinidad Curet, RN [phone]   Other Instructions  CT INSTRUCTIONS Your cardiac CT will be scheduled at one of the below locations:   [facility] [address] [place], [place] [address] [phone]  [place] [address] [place],  [address] [phone]  If scheduled at [facility], please arrive at the [facility] main entrance of [facility] 30 minutes prior to test start time. Proceed to the [facility] Radiology Department (first floor) to check-in and test prep.  If scheduled at [facility], please arrive 15 mins early for check-in and test prep.  Please follow these instructions carefully (unless otherwise directed):  On the Night Before the Test: . Be sure to Drink plenty of water. . Do not consume any caffeinated/decaffeinated beverages or chocolate 12 hours prior to your test. . Do not take any antihistamines 12 hours prior to your test.  On the Day of the Test: . Drink plenty of water. Do not drink any water within one hour of the test. . Do not eat any food 4 hours prior to the test. . You may take your regular medications prior to the test.  . HOLD Furosemide/Hydrochlorothiazide morning of the test. . FEMALES-  please wear underwire-free bra if available       After the Test: . Drink plenty of water. . After receiving IV contrast, you may experience a mild flushed feeling. This is normal. . On occasion, you may experience a mild rash up to 24 hours after the test. This is not dangerous. If this occurs, you can take Benadryl 25 mg and increase  your fluid intake. . If you experience trouble breathing, this can be serious. If it is severe call 911 IMMEDIATELY. If it is mild, please call our office. . If you take any of these medications: Glipizide/Metformin, Avandament, Glucavance, please do not take 48 hours after completing test unless otherwise instructed.   Once we have confirmed authorization from your insurance company, we will call you to set up a date and time for your test. Based on how quickly your insurance processes prior authorizations requests, please allow up to 4 weeks to be contacted for scheduling your Cardiac CT appointment. Be advised that routine Cardiac CT appointments could be scheduled as many as 8 weeks after your provider has ordered it.  For non-scheduling related questions, please contact the cardiac imaging nurse navigator should you have any questions/concerns: Marchia Bond, Cardiac Imaging Nurse Navigator Burley Saver, Interim Cardiac Imaging Nurse [place] [facility] Direct Office Dial: [phone]   For scheduling needs, including cancellations and rescheduling, please call [place], [phone].     Electrophysiology/Ablation Procedure Instructions   You are scheduled for a(n)  ablation on 07/15/2020 with Dr. Allegra Lai.   1.   Pre procedure testing-             A.  LAB WORK --- On 06/18/2020 for your pre procedure blood work.  You do NOT need to be fasting. You can stop by the office prior to your colonoscopy covid screening that morning.                B. COVID TEST-- On 07/13/20 @ 10:00 am  - This is a Drive Up Visit at [address] [place], [place] [address].  Someone will direct you to the appropriate testing line. Stay in your car and someone will be with you shortly.   After you are tested please go home and self quarantine until the day of your procedure.     PROCEDURE DAY: 2. On the day of your procedure 07/15/2020 you will go to [facility] [phone] [address]  [place]) at 6:30 am.  Dennis Bast will go to the main entrance A [employer]) and enter where the [employer] are.  Your driver will drop you off and you will head down the hallway to ADMITTING.  You may have one support person come in to the hospital with you.  They will be asked to wait in the waiting room.  It is OK to have someone drop you off and come back when you are ready to be discharged.   3.   Do not eat or drink after midnight prior to your procedure.   4.   Do not miss any doses of your blood thinner prior to the morning of your procedure or your procedure will need to be rescheduled.       Do NOT take any medications the morning of your procedure.   5.  Plan for an overnight stay, but you may be discharged home after your procedure. If you use your phone frequently bring your phone charger, in case you have to  stay.  If you are discharged after your procedure you will need someone to drive you home and be with your for 24 hours after your procedure.   6. You will follow up with the AFIB clinic 4 weeks after your procedure.  You will follow up with Dr. Curt Bears  3 months after your procedure.  These appointments will be made for you.   * If you have ANY questions please call the office [phone] and ask for Rhianon Zabawa RN or send me a MyChart message   * Occasionally, EP Studies and ablations can become lengthy.  Please make your family aware of this before your procedure starts.  Average time ranges from 2-8 hours for EP studies/ablations.  Your physician will call your family after the procedure with the results.

## 2020-05-12 NOTE — Progress Notes (Signed)
Electrophysiology Office Note   Date:  05/13/2020   ID:  Cynthia Frank, Cynthia Frank 1935/07/26, MRN [id number]  PCP:  [name], MD  Cardiologist:  Aundra Dubin Primary Electrophysiologist:  Lilliona Blakeney Meredith Leeds, MD    Chief Complaint: CHF   History of Present Illness: Cynthia Frank is a 85 y.o. female who is being seen today for the evaluation of CHF at the request of [name], *. Presenting today for electrophysiology evaluation.  She has a history significant for chronic systolic heart failure due to nonischemic cardiomyopathy, paroxysmal atrial fibrillation, ovale and and colon cancers.  She has had a cardiomyopathy for approximately 15 years.  It was thought this was due to Adriamycin.  She is status post Medtronic CRT-D implanted 07/06/2015.  She was having an increased atrial fibrillation burden and is now status post ablation 08/14/2019.  Today, denies symptoms of palpitations, chest pain, shortness of breath, orthopnea, PND, lower extremity edema, claudication, dizziness, presyncope, syncope, bleeding, or neurologic sequela. The patient is tolerating medications without difficulties.  Unfortunately since last October, she has been having symptoms of weakness and fatigue.  She has not had much shortness of breath.  Her blood pressure has been low as well and she has had some dizziness.  She is unclear as to why this is happening.   Past Medical History:  Diagnosis Date  . AICD (automatic cardioverter/defibrillator) present   . Arthritis    "left ankle" (07/14/2015)  . Atrial fibrillation ([place])   . CHF (congestive heart failure) ([place])   . Colon cancer ([place]) 03/2006   T3, N0  . Colon polyps 12/07/2010  . Coronary artery disease    nonobstructive with 30% D1  . GERD (gastroesophageal reflux disease)   . History of ovarian cancer 1985  . Hx: UTI (urinary tract infection)   . Hyperlipidemia   . Hypertension   . Internal hemorrhoid   . Nonischemic  cardiomyopathy ([place])    last EF assessment 40% by MUGA  . Partial bowel obstruction ([place])   . PVC (premature ventricular contraction)    Past Surgical History:  Procedure Laterality Date  . ABDOMINAL HYSTERECTOMY  1979  . ANKLE CLOSED REDUCTION  10/16/2011   Procedure: CLOSED REDUCTION ANKLE;  Surgeon: Tobi Bastos, MD;  Location: [facility] ORS;  Service: Orthopedics;  Laterality: Left;  . ATRIAL FIBRILLATION ABLATION N/A 08/14/2019   Procedure: ATRIAL FIBRILLATION ABLATION;  Surgeon: Constance Haw, MD;  Location: [place] CV LAB;  Service: Cardiovascular;  Laterality: N/A;  . CARDIOVERSION N/A 04/20/2020   Procedure: CARDIOVERSION;  Surgeon: Larey Dresser, MD;  Location: [facility] ENDOSCOPY;  Service: Cardiovascular;  Laterality: N/A;  . COLECTOMY  2007   for colon cancer  . COLONOSCOPY N/A 03/11/2013   Procedure: COLONOSCOPY;  Surgeon: Ladene Artist, MD;  Location: [facility] ENDOSCOPY;  Service: Endoscopy;  Laterality: N/A;  . COLONOSCOPY WITH PROPOFOL N/A 04/21/2015   Procedure: COLONOSCOPY WITH PROPOFOL;  Surgeon: Ladene Artist, MD;  Location: [facility] ENDOSCOPY;  Service: Endoscopy;  Laterality: N/A;  . COLONOSCOPY WITH PROPOFOL N/A 02/05/2018   Procedure: COLONOSCOPY WITH PROPOFOL Hemostasis Clip;  Surgeon: Ladene Artist, MD;  Location: [facility] ENDOSCOPY;  Service: Endoscopy;  Laterality: N/A;  . EP IMPLANTABLE DEVICE N/A 07/14/2015   Procedure: BiV ICD Insertion CRT-D;  Surgeon: Evans Lance, MD;  Location: [place] CV LAB;  Service: Cardiovascular;  Laterality: N/A;  . ESOPHAGOGASTRODUODENOSCOPY (EGD) WITH PROPOFOL N/A 04/21/2015   Procedure: ESOPHAGOGASTRODUODENOSCOPY (EGD) WITH PROPOFOL;  Surgeon:  Ladene Artist, MD;  Location: [facility];  Service: Endoscopy;  Laterality: N/A;  . FRACTURE SURGERY    . INSERTION OF ICD Left 07/14/2015  . LAPAROTOMY  1980   "adhesions"  . LAPAROTOMY  1989   laparotomy for takedown of intestinal obstruction secondary to adhesions   . OOPHORECTOMY  1961   . OVARY SURGERY Right 1985   resection of right ovarian cancer  . POLYPECTOMY  02/05/2018   Procedure: POLYPECTOMY;  Surgeon: Ladene Artist, MD;  Location: [facility] ENDOSCOPY;  Service: Endoscopy;;  . SMALL INTESTINE SURGERY  1985  . TEE WITHOUT CARDIOVERSION N/A 08/14/2019   Procedure: TRANSESOPHAGEAL ECHOCARDIOGRAM (TEE);  Surgeon: Constance Haw, MD;  Location: [place] CV LAB;  Service: Cardiovascular;  Laterality: N/A;  . TONSILLECTOMY  1944  . TOTAL HIP ARTHROPLASTY Right 12/18/2018   Procedure: TOTAL HIP ARTHROPLASTY ANTERIOR APPROACH;  Surgeon: Rod Can, MD;  Location: [facility];  Service: Orthopedics;  Laterality: Right;     Current Outpatient Medications  Medication Sig Dispense Refill  . acetaminophen (TYLENOL) 500 MG tablet Take 1,000 mg by mouth every 6 (six) hours as needed for moderate pain or headache (for pain.).    Marland Kitchen amiodarone (PACERONE) 200 MG tablet Take 1 tablet (200 mg total) by mouth daily. 90 tablet 3  . atorvastatin (LIPITOR) 40 MG tablet TAKE 1 TABLET(40 MG) BY MOUTH DAILY AT 6 PM (Patient taking differently: Take 40 mg by mouth at bedtime.) 90 tablet 3  . Coenzyme Q10 (COQ10) 100 MG CAPS Take 100 mg by mouth daily.    Marland Kitchen conjugated estrogens (PREMARIN) vaginal cream Place 1 Applicatorful vaginally daily as needed (dyspareunia (Use as directed)).     . dapagliflozin propanediol (FARXIGA) 10 MG TABS tablet Take 1 tablet (10 mg total) by mouth daily before breakfast. 30 tablet 6  . diclofenac Sodium (VOLTAREN) 1 % GEL Apply 2 g topically daily.    Marland Kitchen ELIQUIS 2.5 MG TABS tablet TAKE 1 TABLET(2.5 MG) BY MOUTH TWICE DAILY (Patient taking differently: Take 2.5 mg by mouth 2 (two) times daily.) 180 tablet 2  . fluticasone (FLONASE) 50 MCG/ACT nasal spray Place 2 sprays into both nostrils 2 (two) times daily.     . furosemide (LASIX) 40 MG tablet Take 20-40 mg by mouth See admin instructions. Take 40 mg by mouth every morning and 20 mg by mouth every evening    .  ketoconazole (NIZORAL) 2 % shampoo Apply 1 application topically daily as needed (Psoriasis).    Marland Kitchen levothyroxine (SYNTHROID) 25 MCG tablet TAKE 1 TABLET(25 MCG) BY MOUTH DAILY BEFORE BREAKFAST (Patient taking differently: Take 25 mcg by mouth daily before breakfast.) 30 tablet 6  . Magnesium Oxide 500 MG CAPS Take 1,000 mg by mouth 2 (two) times daily.     . metoprolol succinate (TOPROL-XL) 25 MG 24 hr tablet Take 1 tablet (25 mg total) by mouth at bedtime. 90 tablet 3  . potassium chloride SA (KLOR-CON) 20 MEQ tablet Take 1 tablet (20 mEq total) by mouth daily. 90 tablet 3  . sertraline (ZOLOFT) 25 MG tablet Take 1 tablet (25 mg total) by mouth daily. 30 tablet 1  . spironolactone (ALDACTONE) 25 MG tablet Take 1 tablet (25 mg total) by mouth at bedtime. 90 tablet 3  . zolpidem (AMBIEN) 5 MG tablet Take 5 mg by mouth at bedtime as needed for sleep.     No current facility-administered medications for this visit.    Allergies:   Clindamycin/lincomycin, Dramamine ii [meclizine  hcl], Pneumococcal vaccines, and Sulfonamide derivatives   Social History:  The patient  reports that she quit smoking about 42 years ago. Her smoking use included cigarettes. She has a 10.00 pack-year smoking history. She has never used smokeless tobacco. She reports current alcohol use of about 14.0 standard drinks of alcohol per week. She reports that she does not use drugs.   Family History:  The patient's family history includes Heart attack in her father; Heart disease in her father; Prostate cancer in her father; Uterine cancer in her mother.    ROS:  Please see the history of present illness.   Otherwise, review of systems is positive for none.   All other systems are reviewed and negative.   PHYSICAL EXAM: VS:  BP (!) 88/60   Pulse 90   Ht 5\' 4"  (1.626 m)   Wt 126 lb 9.6 oz (57.4 kg)   SpO2 97%   BMI 21.73 kg/m  , BMI Body mass index is 21.73 kg/m. GEN: Well nourished, well developed, in no acute distress   HEENT: normal  Neck: no JVD, carotid bruits, or masses Cardiac: RRR; no murmurs, rubs, or gallops,no edema  Respiratory:  clear to auscultation bilaterally, normal work of breathing GI: soft, nontender, nondistended, + BS MS: no deformity or atrophy  Skin: warm and dry, device site well healed Neuro:  Strength and sensation are intact Psych: euthymic mood, full affect  EKG:  EKG is ordered today. Personal review of the ekg ordered shows atrial flutter, LBBB  Personal review of the device interrogation today. Results in [place]: 08/21/2019: TSH 4.018 04/14/2020: ALT 28; BUN 17; Creatinine, Ser 1.32; Hemoglobin 12.2; Platelets 144; Potassium 4.0; Sodium 134    Lipid Panel     Component Value Date/Time   CHOL 150 12/12/2019 1123   TRIG 60 12/12/2019 1123   HDL 86 12/12/2019 1123   CHOLHDL 1.7 12/12/2019 1123   VLDL 12 12/12/2019 1123   LDLCALC 52 12/12/2019 1123     Wt Readings from Last 3 Encounters:  05/12/20 126 lb 9.6 oz (57.4 kg)  04/20/20 125 lb (56.7 kg)  04/14/20 129 lb (58.5 kg)      Other studies Reviewed: Additional studies/ records that were reviewed today include: TTE 12/16/18  Review of the above records today demonstrates:  1. The left ventricle has mildly reduced systolic function, with an  ejection fraction of 45-50%. The cavity size was normal. Left ventricular  diastolic Doppler parameters are indeterminate. No evidence of left  ventricular regional wall motion  abnormalities.  2. The right ventricle has normal systolic function. The cavity was  normal. There is no increase in right ventricular wall thickness. Right  ventricular systolic pressure is moderately elevated.  3. Left atrial size was mildly dilated.  4. There is mild mitral annular calcification present.  5. Tricuspid valve regurgitation is mild-moderate.  6. The aorta is normal unless otherwise noted.  7. Ventricular pacing noted. No visible A wave on mitral  inflow or on  mitral annulus tissue Doppler suggests atrial fibrillation at the time of  the study (or atrial mechanical failure). This limits the accuracy of  diastolic function assessment.  8. When compared to the prior study: 12/16/2015, there is improved left  ventricular systolic function due to improved lateral-septal synchrony by  CRT pacing. Despite this, there is worsened pulmonary artery hypertension.    ASSESSMENT AND PLAN:  1.  Chronic systolic heart failure due to nonischemic cardiomyopathy: Ejection fraction  improved to 45 to 50%.  Class NYHA II symptoms.  Currently on Toprol-XL and Aldactone.  Has not tolerated Entresto in the past.  Plan per heart failure team.    2.  Paroxysmal atrial fibrillation/atrial tachycardia/atrial flutter: Currently on Eliquis with a CHA2DS2-VASc of 3.  Also on amiodarone, high risk medication monitoring.  Status post ablation 08/14/2019.  From her device interrogation, it does not appear that she has had much atrial fibrillation.  She is in atrial flutter today and has been since October.  She has symptoms of weakness, fatigue, as well as some shortness of breath.  She is on a higher dose of amiodarone and has had a cardioversion but unfortunately went back into atrial flutter.  I have offered her ablation which she has agreed to.  Risks and benefits have been discussed bleeding, tamponade, heart block, stroke, damage to chest organs.  She understands these risks and is agreed to the procedure.  Case discussed with primary cardiology  Current medicines are reviewed at length with the patient today.   The patient does not have concerns regarding her medicines.  The following changes were made today: None  Labs/ tests ordered today include:  Orders Placed This Encounter  Procedures  . CT CARDIAC MORPH/PULM VEIN W/CM&W/O CA SCORE  . Basic metabolic panel  . CBC  . EKG 12-Lead     Disposition:   FU with Arlyne Brandes 3 months  Signed, Shamina Etheridge  Meredith Leeds, MD  05/13/2020 7:40 AM     [facility] [address] [place] [address] [phone] (office) [phone] (fax)

## 2020-05-14 NOTE — Progress Notes (Signed)
Remote ICD transmission.   

## 2020-05-15 ENCOUNTER — Encounter (HOSPITAL_COMMUNITY): Payer: Medicare Other | Admitting: Cardiology

## 2020-05-16 ENCOUNTER — Other Ambulatory Visit (HOSPITAL_COMMUNITY): Payer: Self-pay | Admitting: Cardiology

## 2020-05-26 ENCOUNTER — Ambulatory Visit (INDEPENDENT_AMBULATORY_CARE_PROVIDER_SITE_OTHER): Payer: Medicare Other

## 2020-05-26 DIAGNOSIS — Z95810 Presence of automatic (implantable) cardiac defibrillator: Secondary | ICD-10-CM

## 2020-05-26 DIAGNOSIS — I5022 Chronic systolic (congestive) heart failure: Secondary | ICD-10-CM

## 2020-05-26 NOTE — Progress Notes (Signed)
EPIC Encounter for ICM Monitoring  Patient Name: Cynthia Frank is a 85 y.o. female Date: 05/26/2020 Primary Care Physican: [name], MD Primary Cardiologist:[place] Electrophysiologist: Santina Evans Pacing: 99.2% 05/26/2020 Weight: 129lbs  Clinical Status (26-May-2020 to 26-May-2020) AT/AF 32 episodes  Time in AT/AF (45.3%) Longest AT/AF 55 minutes   Spoke with patient and reports feeling well at this time.  Denies fluid symptoms.   Per 05/12/2020 Dr Curt Bears OV note, patient is in atrial flutter and may need another cardioversion.       Patients husband tested positive for COVID 05/25/2020.  She does not have any symptoms.  Opitvol thoracic impedancesuggesting possible fluid accumulation since 05/13/2020.  Prescribed: Furosemide40 mgTake 1 tablet (40 mg total) by mouth every morning AND 0.5 tablets (20 mg total) every evening.  Labs: 04/14/2020 Creatinine 1.32, BUN 17, Potassium 4.0, Sodium 134, GFR 40 03/24/2020 Creatinine 1.33, BUN 19, Potassium 3.5  Sodium 132, GFR 40 03/17/2020 Creatinine 1.28, BUN 20, Potassium 3.8, Sodium 133, GFR 42 12/12/2019 Creatinine 1.11, BUN 19, Potassium 3.8, Sodium 135, GFR 47-54 08/21/2019 Creatinine1.11, BUN18, Potassium4.5, Sodium132, GFR47-54 08/02/2019 Creatinine0.96, BUN20, Potassium3.9, Sodium136, [id number] 06/17/2019 Creatinine1.21, BUN23, Potassium4.0, Sodium133, GFR42-49 05/20/2019 Creatinine1.38, BUN19, Potassium3.8, [id number], UOR56-15 05/09/2019 Creatinine1.52, BUN21, Potassium4.1, Sodium130, PPH43-27 A complete set of results can be found in Results Review.  Recommendations:Recommendation to limit salt intake.  Encouraged to call if experiencing any fluid symptoms.   Follow-up plan: ICM clinic phone appointment on 06/01/2020 to recheck fluid levels. 91 day device clinic remote transmission4/03/2021.   EP/Cardiology Office Visits: 06/23/2020 with Dr Aundra Dubin.    05/29/2020 with Dr Curt Bears  Copy of ICM check sent to Dr.Taylor.    3 month ICM trend: 05/26/2020.    1 Year ICM trend:       Rosalene Billings, RN 05/26/2020 2:00 PM

## 2020-05-26 NOTE — Progress Notes (Signed)
Patient asked if she should cancel appointment with Dr Curt Bears on 2/11 since her husband has COVID.  Advised the office nurse reports she will need to cancel the appointment since she had direct exposure to [place].  Advised message was sent to Dr Macky Lower nurse Sherri informing her of the situation.  Advised if she has not received a call from the Sherri by 2/10 to call the office to reschedule the appointment.

## 2020-05-28 ENCOUNTER — Telehealth: Payer: Self-pay | Admitting: Gastroenterology

## 2020-05-28 NOTE — Telephone Encounter (Signed)
Patient's pre-visit has been changed to telephone visit.  She is feeling fine, but recently diagnosed with COVID.  Will leave the colonoscopy as scheduled for 3/7

## 2020-05-28 NOTE — Telephone Encounter (Signed)
Patient called states she is positive for Covid and is requesting to reschedule

## 2020-05-29 ENCOUNTER — Other Ambulatory Visit (HOSPITAL_COMMUNITY): Payer: Self-pay

## 2020-05-29 ENCOUNTER — Encounter: Payer: Medicare Other | Admitting: Cardiology

## 2020-05-29 MED ORDER — FUROSEMIDE 40 MG PO TABS: 20.0000 mg | ORAL_TABLET | ORAL | 1 refills | Status: DC

## 2020-06-01 ENCOUNTER — Ambulatory Visit (INDEPENDENT_AMBULATORY_CARE_PROVIDER_SITE_OTHER): Payer: Medicare Other

## 2020-06-01 DIAGNOSIS — Z95810 Presence of automatic (implantable) cardiac defibrillator: Secondary | ICD-10-CM

## 2020-06-01 DIAGNOSIS — I5022 Chronic systolic (congestive) heart failure: Secondary | ICD-10-CM

## 2020-06-02 ENCOUNTER — Ambulatory Visit (AMBULATORY_SURGERY_CENTER): Payer: Medicare Other

## 2020-06-02 ENCOUNTER — Other Ambulatory Visit: Payer: Self-pay

## 2020-06-02 VITALS — Ht 64.0 in | Wt 124.0 lb

## 2020-06-02 DIAGNOSIS — Z85038 Personal history of other malignant neoplasm of large intestine: Secondary | ICD-10-CM

## 2020-06-02 DIAGNOSIS — Z01818 Encounter for other preprocedural examination: Secondary | ICD-10-CM

## 2020-06-02 MED ORDER — PLENVU 140 G PO SOLR: 1.0000 | ORAL | 0 refills | Status: DC

## 2020-06-02 NOTE — Progress Notes (Signed)
Pre visit completed via phone call;  Patient verified name, DOB, and address; No egg or soy allergy known to patient  No issues with past sedation with any surgeries or procedures No intubation problems in the past  No FH of Malignant Hyperthermia No diet pills per patient No home 02 use per patient  No blood thinners per patient  Pt denies issues with constipation  No A flutter ---AFIB present along with AICD EMMI video via [place] 19 guidelines implemented in PV today with Pt and RN  COVID screening scheduled on 06/18/2020 at 10:30 am; patient is aware of appt date/time; PATIENT current has COVID (tested + on 05/26/2020)- Pt denies loose or missing teeth; Patient denies dentures, partials, capped; patient reports dental implants with a bonded tooth; Discussed with pt there will be an out-of-pocket cost for prep and that varies from $0 to 70 dollars  Due to the COVID-19 pandemic we are asking patients to follow certain guidelines.  Pt aware of COVID protocols and [facility] guidelines    Plenvu sample placed on 3 rd floor for patient to pick up along with instructions per her request;  Sample LOT # [id number] EXP 07/2020

## 2020-06-05 NOTE — Progress Notes (Signed)
EPIC Encounter for ICM Monitoring  Patient Name: Cynthia Frank is a 85 y.o. female Date: 06/05/2020 Primary Care Physican: [name], MD Primary [place] Electrophysiologist: Santina Evans Pacing: 61.4% 2/8/2022Weight: 129lbs  Clinical Status (26-May-2020 to 01-Jun-2020) AT/AF 188 episodes  Time in AT/AF 21.1 hr/day (87.8%)   Spoke with patient and reports feeling well at this time. Denies fluid symptoms.   Opitvol thoracic impedancesuggesting fluid levels have returned close to normal.  Message sent to device clinic triage to review report for changes in V pacing % and AT/AF episodes.  Prescribed: Furosemide40 mgTake 1 tablet (40 mg total) by mouth every morning AND 0.5 tablets (20 mg total) every evening.  Labs: 04/14/2020 Creatinine 1.32, BUN 17, Potassium 4.0, Sodium 134, GFR 40 03/24/2020 Creatinine 1.33, BUN 19, Potassium 3.5  Sodium 132, GFR 40 03/17/2020 Creatinine 1.28, BUN 20, Potassium 3.8, Sodium 133, GFR 42 12/12/2019 Creatinine 1.11, BUN 19, Potassium 3.8, Sodium 135, GFR 47-54 08/21/2019 Creatinine1.11, BUN18, Potassium4.5, Sodium132, GFR47-54 08/02/2019 Creatinine0.96, BUN20, Potassium3.9, Sodium136, VVO16-07 06/17/2019 Creatinine1.21, BUN23, Potassium4.0, Sodium133, GFR42-49 05/20/2019 Creatinine1.38, BUN19, Potassium3.8, [id number], [id number] 05/09/2019 Creatinine1.52, BUN21, Potassium4.1, Sodium130, [id number] A complete set of results can be found in Results Review.  Recommendations:Recommendation to limit salt intake.  Encouraged to call if experiencing any fluid symptoms.   Follow-up plan: ICM clinic phone appointment on3/14/2022. 91 day device clinic remote transmission4/03/2021.   EP/Cardiology Office Visits: 06/23/2020 with Dr Aundra Dubin.   05/29/2020 with Dr Curt Bears  Copy of ICM check sent to Dr.Taylor.   3 month ICM trend: 06/01/2020.    1 Year ICM trend:        Rosalene Billings, RN 06/05/2020 5:02 PM

## 2020-06-12 ENCOUNTER — Other Ambulatory Visit: Payer: Self-pay | Admitting: Internal Medicine

## 2020-06-16 NOTE — Progress Notes (Signed)
Attempted to obtain medical history via telephone, unable to reach at this time. I left a voicemail to return pre surgical testing department's phone call.  

## 2020-06-18 ENCOUNTER — Other Ambulatory Visit: Payer: Self-pay

## 2020-06-18 ENCOUNTER — Other Ambulatory Visit: Payer: Medicare Other | Admitting: *Deleted

## 2020-06-18 ENCOUNTER — Other Ambulatory Visit (HOSPITAL_COMMUNITY)
Admission: RE | Admit: 2020-06-18 | Discharge: 2020-06-18 | Disposition: A | Discharge status: Home / Self Care | Payer: Medicare Other | Source: Ambulatory Visit | Attending: Gastroenterology | Admitting: Gastroenterology

## 2020-06-18 DIAGNOSIS — I4819 Other persistent atrial fibrillation: Secondary | ICD-10-CM | POA: Diagnosis not present

## 2020-06-18 DIAGNOSIS — U071 COVID-19: Secondary | ICD-10-CM | POA: Insufficient documentation

## 2020-06-18 DIAGNOSIS — Z01812 Encounter for preprocedural laboratory examination: Secondary | ICD-10-CM

## 2020-06-18 LAB — CBC
Hematocrit: 36.6 % (ref 34.0–46.6)
Hemoglobin: 12.2 g/dL (ref 11.1–15.9)
MCH: 28.4 pg (ref 26.6–33.0)
MCHC: 33.3 g/dL (ref 31.5–35.7)
MCV: 85 fL (ref 79–97)
Platelets: 142 10*3/uL — ABNORMAL LOW (ref 150–450)
RBC: 4.3 x10E6/uL (ref 3.77–5.28)
RDW: 16.8 % — ABNORMAL HIGH (ref 11.7–15.4)
WBC: 5.3 10*3/uL (ref 3.4–10.8)

## 2020-06-18 LAB — BASIC METABOLIC PANEL
BUN/Creatinine Ratio: 20 (ref 12–28)
BUN: 22 mg/dL (ref 8–27)
CO2: 28 mmol/L (ref 20–29)
Calcium: 9.3 mg/dL (ref 8.7–10.3)
Chloride: 98 mmol/L (ref 96–106)
Creatinine, Ser: 1.09 mg/dL — ABNORMAL HIGH (ref 0.57–1.00)
Glucose: 99 mg/dL (ref 65–99)
Potassium: 4.6 mmol/L (ref 3.5–5.2)
Sodium: 134 mmol/L (ref 134–144)
eGFR: 51 mL/min/{1.73_m2} — ABNORMAL LOW (ref >59)

## 2020-06-18 LAB — SARS CORONAVIRUS 2 (TAT 6-24 HRS): SARS Coronavirus 2: POSITIVE — AB

## 2020-06-18 NOTE — Progress Notes (Signed)
Pt states she tested + for covid on 05/25/20 via a home test. Pt made aware that the decision to proceed with the procedure will be up to the ordering provider based on the test results.

## 2020-06-19 ENCOUNTER — Telehealth: Payer: Self-pay

## 2020-06-19 ENCOUNTER — Telehealth: Payer: Self-pay | Admitting: Gastroenterology

## 2020-06-19 NOTE — Progress Notes (Signed)
Called Cynthia Frank from Dr. Jerilynn Mages. Stark's office with + covid results for this pt. The pt is to have surgery on Mon 06/22/20 at [facility] at 1200. The pt has not been notified as there will be pertinent questions the provider needs to answer.   Positive Results for:  Asymptomatic: Procedure postponed and quarantined for 10 days, unless the procedure is urgent. Please follow the facility's protocol regarding pt and family arrival.  Symptomatic: Procedure postponed and quarantined for 14 days.  Hospitalized with Covid: postponed and quarantined  for 21 days.  Immunocompromised: Procedure postponed and quarantine for 20 days.  The pt will not be retested for 90 days from the + result.

## 2020-06-19 NOTE — Telephone Encounter (Signed)
Pt is a stark pt, please route accordingly.

## 2020-06-19 NOTE — Telephone Encounter (Signed)
Patient is COVID positive per results from yesterday.  She had a home test on 05/27/20, but [facility] does not recognize home testing as a positive results.  Per Lattie Haw at [facility], patient must be rescheduled.  Patient been rescheduled to 07/20/20 at 8:45.  She understands to arrive at 7:15 at [facility].  She has her instructions and will call if she has any questions when it gets closer.

## 2020-06-19 NOTE — Telephone Encounter (Signed)
Covid testing site representative called to advise this patient tested positive yesterday with no symptoms please advise has procedure schedule at [facility] on Monday.

## 2020-06-19 NOTE — Telephone Encounter (Signed)
Called to discuss with patient about COVID-19 symptoms and the use of one of the available treatments for those with mild to moderate Covid symptoms and at a high risk of hospitalization.  Pt appears to qualify for outpatient treatment due to co-morbid conditions and/or a member of an at-risk group in accordance with the FDA Emergency Use Authorization.    Symptom onset: None Vaccinated: Yes Booster? Yes Immunocompromised? No Qualifiers: CHF Pt. Reports she had COVID in Feb. Marcello Moores

## 2020-06-22 ENCOUNTER — Ambulatory Visit (HOSPITAL_COMMUNITY): Admission: RE | Admit: 2020-06-22 | Payer: Medicare Other | Source: Home / Self Care | Admitting: Gastroenterology

## 2020-06-22 ENCOUNTER — Encounter (HOSPITAL_COMMUNITY): Admission: RE | Payer: Self-pay | Source: Home / Self Care

## 2020-06-22 ENCOUNTER — Telehealth: Payer: Self-pay | Admitting: Cardiology

## 2020-06-22 SURGERY — COLONOSCOPY WITH PROPOFOL
Anesthesia: Monitor Anesthesia Care

## 2020-06-22 NOTE — Telephone Encounter (Signed)
Left message to call back  

## 2020-06-22 NOTE — Telephone Encounter (Signed)
New message   Pt would like call from RN. She would like to discuss covid test and CT before ablation.

## 2020-06-23 ENCOUNTER — Ambulatory Visit (HOSPITAL_COMMUNITY)
Admission: RE | Admit: 2020-06-23 | Discharge: 2020-06-23 | Disposition: A | Discharge status: Home / Self Care | Payer: Medicare Other | Source: Ambulatory Visit | Attending: Cardiology | Admitting: Cardiology

## 2020-06-23 ENCOUNTER — Encounter (HOSPITAL_COMMUNITY): Payer: Self-pay | Admitting: Cardiology

## 2020-06-23 ENCOUNTER — Other Ambulatory Visit: Payer: Self-pay

## 2020-06-23 ENCOUNTER — Telehealth: Payer: Self-pay | Admitting: Cardiology

## 2020-06-23 VITALS — BP 90/58 | HR 68 | Wt 127.4 lb

## 2020-06-23 DIAGNOSIS — I2510 Atherosclerotic heart disease of native coronary artery without angina pectoris: Secondary | ICD-10-CM | POA: Diagnosis not present

## 2020-06-23 DIAGNOSIS — E041 Nontoxic single thyroid nodule: Secondary | ICD-10-CM | POA: Diagnosis not present

## 2020-06-23 DIAGNOSIS — Z7984 Long term (current) use of oral hypoglycemic drugs: Secondary | ICD-10-CM | POA: Insufficient documentation

## 2020-06-23 DIAGNOSIS — Z881 Allergy status to other antibiotic agents status: Secondary | ICD-10-CM | POA: Diagnosis not present

## 2020-06-23 DIAGNOSIS — Z85038 Personal history of other malignant neoplasm of large intestine: Secondary | ICD-10-CM | POA: Insufficient documentation

## 2020-06-23 DIAGNOSIS — R7989 Other specified abnormal findings of blood chemistry: Secondary | ICD-10-CM | POA: Diagnosis not present

## 2020-06-23 DIAGNOSIS — Z7901 Long term (current) use of anticoagulants: Secondary | ICD-10-CM | POA: Diagnosis not present

## 2020-06-23 DIAGNOSIS — Z882 Allergy status to sulfonamides status: Secondary | ICD-10-CM | POA: Insufficient documentation

## 2020-06-23 DIAGNOSIS — I5022 Chronic systolic (congestive) heart failure: Secondary | ICD-10-CM | POA: Insufficient documentation

## 2020-06-23 DIAGNOSIS — Z8616 Personal history of COVID-19: Secondary | ICD-10-CM | POA: Insufficient documentation

## 2020-06-23 DIAGNOSIS — Z8543 Personal history of malignant neoplasm of ovary: Secondary | ICD-10-CM | POA: Diagnosis not present

## 2020-06-23 DIAGNOSIS — Z87891 Personal history of nicotine dependence: Secondary | ICD-10-CM | POA: Diagnosis not present

## 2020-06-23 DIAGNOSIS — I428 Other cardiomyopathies: Secondary | ICD-10-CM | POA: Diagnosis not present

## 2020-06-23 DIAGNOSIS — I480 Paroxysmal atrial fibrillation: Secondary | ICD-10-CM | POA: Diagnosis not present

## 2020-06-23 DIAGNOSIS — E039 Hypothyroidism, unspecified: Secondary | ICD-10-CM | POA: Insufficient documentation

## 2020-06-23 DIAGNOSIS — R059 Cough, unspecified: Secondary | ICD-10-CM | POA: Diagnosis not present

## 2020-06-23 DIAGNOSIS — Z950 Presence of cardiac pacemaker: Secondary | ICD-10-CM | POA: Diagnosis not present

## 2020-06-23 DIAGNOSIS — Z79899 Other long term (current) drug therapy: Secondary | ICD-10-CM | POA: Diagnosis not present

## 2020-06-23 DIAGNOSIS — I110 Hypertensive heart disease with heart failure: Secondary | ICD-10-CM | POA: Insufficient documentation

## 2020-06-23 DIAGNOSIS — I483 Typical atrial flutter: Secondary | ICD-10-CM | POA: Diagnosis not present

## 2020-06-23 DIAGNOSIS — Z791 Long term (current) use of non-steroidal anti-inflammatories (NSAID): Secondary | ICD-10-CM | POA: Insufficient documentation

## 2020-06-23 DIAGNOSIS — Z8249 Family history of ischemic heart disease and other diseases of the circulatory system: Secondary | ICD-10-CM | POA: Diagnosis not present

## 2020-06-23 MED ORDER — AMIODARONE HCL 200 MG PO TABS: 200.0000 mg | ORAL_TABLET | Freq: Every day | ORAL | 3 refills | Status: DC

## 2020-06-23 MED ORDER — FUROSEMIDE 40 MG PO TABS: 40.0000 mg | ORAL_TABLET | Freq: Two times a day (BID) | ORAL | 11 refills | Status: DC

## 2020-06-23 NOTE — Telephone Encounter (Signed)
Reports that she was Covid positive 3/3 and they cancelled her colonoscopy. Aware that we will not retest her prior to ablation end of this month, cancelled covid testing. Aware will proceed w/ ablation but to call office if she becomes symptomatic prior to procedure. Patient verbalized understanding and agreeable to plan.

## 2020-06-23 NOTE — Progress Notes (Signed)
Date:  06/23/2020   ID:  Cynthia Frank, DOB [birth year], MRN [id number]  Provider location: [facility] Type of Visit: Established patient   PCP:  [name], MD  Cardiologist:  Dr. Aundra Dubin   History of Present Illness: Cynthia Frank is a 85 y.o. female who has a history of chronic systolic CHF/nonischemic cardiomyopathy, paroxysmal atrial fibrillation, and prior ovarian and colon cancers. She has had a cardiomyopathy known for > 15 years now. Cardiac cath at diagnosis showed mild nonobstructive disease.  EF has been persistently low.  Cause has been thought to be Adriamycin; she received this as part of her ovarian cancer treatment. She has had paroxysmal atrial fibrillation and has remained in NSR with amiodarone use.  No recent atrial fibrillation symptoms, typically feels prolonged palpitations thought to be atrial fibrillation 2-3 times/year.   She had had dyspnea with moderate to heavy exertion for 4-5 years prior to initial appt.  However, just before her initial appt, her symptoms had worsened.  She developed shortness of breath walking short distances on flat ground.  Lasix was increased to 40 mg bid and she lost weight and felt better.     Cardiac MRI was done in 1/17, showing EF 29% with normal RV.  Septal-lateral dyssynchrony was present and there was a non-coronary pattern of LGE in the septum.  LFTs were noted to be mildly elevated.  Amiodarone was decreased to 100 mg daily.   She had Medtronic CRT-D placed in 3/17. Repeat echo 8/17 showed EF 35% with diffuse hypokinesis, normal RV.  Echo in 4/19 showed EF 30-35%, mild AI.   On 11/12/17, she stood up, got lightheaded and passed out briefly with a fall.  Her ICD did not discharge.  She went to the ER and was found to have right C5 transverse process fracture.  She says that she has had lightheadedness with standing for years, comes and goes.  I decreased her Toprol XL.   In 8/20, she  had an episode of lightheadedness with standing followed by syncope and fall with right hip fracture.  She was admitted, had repair of right hip.  She was noted to be orthostatic and Entresto was stopped.  Echo in 8/20 showed EF 45-50% with normal RV systolic function.  In 1/21, patient was in atrial tachycardia and had been in it for several weeks.  She was volume overloaded.  Lasix was increased and she was brought into the hospital for DCCV, but she had converted to NSR on her own.    In 4/21, she had atrial fibrillation ablation.  TEE done in 4/21 showed EF 30-35% with global HK.  She is now off amiodarone.   Echo in 11/21 showed EF 25-30% with mild LV dilation, global hypokinesis with septal-lateral dyssynchrony, moderate MR, moderately decreased RV systolic function with normal RV size, IVC normal.   She went into atrial flutter in 11/21 and had DCCV back to NSR in 1/22.  She stayed in NSR for a few weeks but is back in atrial flutter again.  She had COVID-19 in 2/22.   She returns today for followup of CHF.  She is now back in atrial flutter.  Weight is stable.  No dyspnea walking around the house or on flat ground.  She is short of breath walking up stairs.  No lightheadedness, no chest pain.  No orthopnea/PND.  She has seen Dr. Curt Bears to consider atrial flutter ablation.  It is planned for  the end of the month due to recent COVID-19.   Medtronic device interrogation: She has been back in atrial flutter for several days.  Low BiV pacing percentage.  Decreasing thoracic impedance.   ECG (personally reviewed): Atrial flutter with BiV pacing  Labs (1/17): K 4.2, creatinine 1.14, BNP 855 Labs (3/17): SPEP negative, BNP 341, K 4.5, creatinine 1.27, AST 62, ALT 74, TSH normal with low free T3 and normal free T4, HCT 46.5 Labs (4/17): AST 64, ALT 67, BNP 278, K 3.6, creatinine 1.21 Labs (5/17): K 3.4, creatinine 1.39, AST 53, ALT 61, HBV/HCV negative, TSH mildly elevated, BNP 306 Labs (6/17):  K 4.4, creatinine 1.36, free T4 normal, free T3 mildly decreased, AST 50, ALT 53. Labs (7/17): ANA negative, ASMA positive Labs (8/17): AST 45, ALT 37, K 3.8, creatinine 1.19, TSH normal Labs (2/18): TSH normal, LDL 47, LFTs, normal Labs (3/18): K normal, creatinine 1.23.  Labs (7/18): K 4, creatinine 1.32, LFTs normal Labs (7/19): K 3.8, creatinine 1.25, hgb 11.6 Labs (8/19): TSH mildly elevated, K 4.2, creatinine 1.22 Labs (10/19): K 4.1, creatinine 1.23, LFTs normal, TSH mildly elevated with normal free T3 and free T4.  Labs (1/20): K 3.5, creatinine 1.15, TSH elevated, free T3 and free T4 low, AST 63, ALT 51 Labs (5/20): K 3.8, creatinine 1.22, TSH normal, AST 49, ALT 43, tbili 0.9 Labs (11/20): K 4, creatinine 1.15  Labs (12/20): K 4.1, creatinine 1.25, TSH normal, AST 51, ALT 50 Labs (1/21): AST 145, ALT 107, tbili 1.8, TSH normal Labs (2/21): K 3.8, creatinine 1.38 Labs (3/21): AST 50, ALT 46 Labs (4/21): K 3.9, creatinine 0.96 Labs (5/21): K 4.5, creatinine 1.11, hgb 13.1, plts 141, AST 65, ALT 53 Labs (8/21): LDL 52, K 3.8, creatinine 1.11, AST 67, ALT 52 Labs (11/21): LFTs normal Labs (12/21): K 3.5, creatinine 1.33, LFTs normal Labs (3/22): K 4.6, creatinine 1.09, hgb 12.2  PMH: 1. Chronic systolic CHF: Nonischemic cardiomyopathy, thought to be related to adriamycin that she had for treatment of ovarian cancer.  NICM diagnosed ~ 2001.  - LHC ~ 2001 with 30% stenosis D1.   - Echo (2013) EF 25-30%.  - Echo (10/14) with EF 25-30%. - Echo (12/16) with EF 20-25%, mild LV dilation, restrictive diastolic function, moderate MR, severe LAE. - Cardiac MRI (1/17): Moderately dilated LV with EF 29%. Diffuse hypokinesis looking worse in the septal wall. Septal-lateral dyssynchrony. Normal RV size and systolic function.  Moderate MR with tethered posterior leaflet.  Mid-wall LGE in the basal to mid septum. This is not a coronary disease pattern. Could be suggestive of prior myocarditis. -  Medtronic CRT-D placed 3/17.  - Echo (8/17): EF 35%, diffuse hypokinesis, normal RV size and systolic function, moderate MR.   - Echo (4/19): EF 30-35%, mild AI.  - Echo (8/20): EF 45-50%, diffuse hypokinesis, normal RV size and systolic function. - TEE (4/21): EF 30%, global hypokinesis.  - Echo (11/21): EF 25-30% with mild LV dilation, global hypokinesis with septal-lateral dyssynchrony, moderate MR, moderately decreased RV systolic function with normal RV size, IVC normal.  2. Atrial fibrillation/atrial tachycardia: Paroxysmal.  - 4/21 atrial fibrillation ablation.  3. HTN 4. Ovarian cancer: 1985, s/p surgery. Received Adriamycin-based chemotherapy.  5. Colon cancer: 2007, s/p surgery.  6. PVCs 7. GERD 8. PFTs (8/16) without significant abnormality. 9. Prior syncope 10. Elevated transaminases: Uncertain etiology.  - Abdominal US (12/20): Normal-appearing liver.  11. COVID-19 infection: 2/22.  12. Atrial flutter: DCCV in 1/22.   Current  Outpatient Medications  Medication Sig Dispense Refill  . acetaminophen (TYLENOL) 500 MG tablet Take 500 mg by mouth every 6 (six) hours as needed for moderate pain or headache.    Marland Kitchen atorvastatin (LIPITOR) 40 MG tablet TAKE 1 TABLET(40 MG) BY MOUTH DAILY AT 6 PM 90 tablet 3  . clobetasol (TEMOVATE) 0.05 % external solution Apply 1 application topically 2 (two) times daily.    . Coenzyme Q10 (COQ10) 100 MG CAPS Take 100 mg by mouth daily.    Marland Kitchen conjugated estrogens (PREMARIN) vaginal cream Place 1 Applicatorful vaginally daily as needed (dyspareunia (Use as directed)).     . dapagliflozin propanediol (FARXIGA) 10 MG TABS tablet Take 1 tablet (10 mg total) by mouth daily before breakfast. 30 tablet 6  . diclofenac Sodium (VOLTAREN) 1 % GEL Apply 2 g topically 2 (two) times daily as needed (pain).    Marland Kitchen ELIQUIS 2.5 MG TABS tablet TAKE 1 TABLET(2.5 MG) BY MOUTH TWICE DAILY 180 tablet 2  . ipratropium (ATROVENT) 0.03 % nasal spray Place 1 spray into both  nostrils 2 (two) times daily.    Marland Kitchen ketoconazole (NIZORAL) 2 % shampoo Apply 1 application topically daily as needed (Psoriasis).    Marland Kitchen levothyroxine (SYNTHROID) 25 MCG tablet TAKE 1 TABLET(25 MCG) BY MOUTH DAILY BEFORE BREAKFAST 30 tablet 6  . Magnesium Oxide 500 MG CAPS Take 1,000 mg by mouth 2 (two) times daily.     . metoprolol succinate (TOPROL-XL) 25 MG 24 hr tablet Take 1 tablet (25 mg total) by mouth at bedtime. 90 tablet 3  . potassium chloride SA (KLOR-CON) 20 MEQ tablet Take 1 tablet (20 mEq total) by mouth daily. 90 tablet 3  . sertraline (ZOLOFT) 25 MG tablet TAKE 1 TABLET(25 MG) BY MOUTH DAILY 30 tablet 1  . spironolactone (ALDACTONE) 25 MG tablet Take 1 tablet (25 mg total) by mouth at bedtime. 90 tablet 3  . zolpidem (AMBIEN) 5 MG tablet Take 5 mg by mouth at bedtime as needed for sleep.    Marland Kitchen amiodarone (PACERONE) 200 MG tablet Take 1 tablet (200 mg total) by mouth daily. 90 tablet 3  . furosemide (LASIX) 40 MG tablet Take 1 tablet (40 mg total) by mouth 2 (two) times daily. 30 tablet 11   No current facility-administered medications for this encounter.    Allergies:   Clindamycin/lincomycin, Dramamine ii [meclizine hcl], Pneumococcal vaccines, and Sulfonamide derivatives   Social History:  The patient  reports that she quit smoking about 42 years ago. Her smoking use included cigarettes. She has a 10.00 pack-year smoking history. She has never used smokeless tobacco. She reports current alcohol use of about 14.0 standard drinks of alcohol per week. She reports that she does not use drugs.   Family History:  The patient's family history includes Heart attack in her father; Heart disease in her father; Prostate cancer in her father; Uterine cancer in her mother.   ROS:  Please see the history of present illness.   All other systems are personally reviewed and negative.   Exam:  BP (!) 90/58   Pulse 68   Wt 57.8 kg (127 lb 6.4 oz)   SpO2 98%   BMI 21.87 kg/m  General:  NAD Neck: JVP 8 cm with HJR, no thyromegaly or thyroid nodule.  Lungs: Clear to auscultation bilaterally with normal respiratory effort. CV: Nondisplaced PMI.  Heart irregular S1/S2, no S3/S4, no murmur.  Trace ankle edema.  No carotid bruit.  Normal pedal pulses.  Abdomen: Soft,  nontender, no hepatosplenomegaly, no distention.  Skin: Intact without lesions or rashes.  Neurologic: Alert and oriented x 3.  Psych: Normal affect. Extremities: No clubbing or cyanosis.  HEENT: Normal.   Recent Labs: 08/21/2019: TSH 4.018 04/14/2020: ALT 28 06/18/2020: BUN 22; Creatinine, Ser 1.09; Hemoglobin 12.2; Platelets 142; Potassium 4.6; Sodium 134  Personally reviewed   Wt Readings from Last 3 Encounters:  06/23/20 57.8 kg (127 lb 6.4 oz)  06/02/20 56.2 kg (124 lb)  05/12/20 57.4 kg (126 lb 9.6 oz)    ASSESSMENT AND PLAN:  1. Chronic systolic CHF: EF 71-06% with diffuse hypokinesis on 12/16 echo.  Nonischemic cardiomyopathy, probably related to Adriamycin with ovarian cancer treatment. Cardiac MRI in 1/17 showed EF 29% with normal RV and septal-lateral dyssynchrony.  There was a non-cardiac pattern of LGE in the septum, cannot rule out prior myocarditis based on this pattern.  She has a Medtronic CRT-D device. Echo 4/19 with EF 30-35%, then echo in 8/20 showed EF up to 45-50%.  TEE in 3/21 showed EF 30-35%.  Echo in 11/21 showed EF 25-30% with mild LV dilation, global hypokinesis with septal-lateral dyssynchrony, moderate MR, moderately decreased RV systolic function with normal RV size, IVC normal.  NYHA class II-III symptoms currently, she is volume overloaded on exam and by [place].  She is back in atrial flutter with lower BiV pacing percentage, this seems to correlate with worsening CHF.    - Continue Toprol XL 25 daily.   - Continue spironolactone 25 mg daily.  - She will stay off Entresto given syncope and lightheadedness while taking it.  Will not start losartan with soft BP.  - Increase Lasix to 40  mg bid, in 10 days.  - Continue Farxiga 10 mg daily.  - She needs to get back into NSR to allow effective BiV pacing.  2. Atrial arrhythmias: She needs to maintain NSR as she loses BiV pacing when she goes into atrial arrhythmias, triggering CHF exacerbation.  She had atrial fibrillation ablation in 4/21.  She went into atrial flutter in 11/21, she had DCCV in 12/21, now back in atrial flutter.  She is now back on amiodarone => LFTs were elevated mildly on amiodarone in the past but abdominal US did not show liver abnormalities.  Most recent LFTs in 12/21 were normal.  I think that her QT interval has been too long for Tikosyn.  She tolerates atrial flutter poorly with CHF worsening.  - Continue amiodarone, decrease to 200 mg daily.  Check LFTs and TSH with next labs, will need regular eye exam.   - Continue apixaban, she has not missed doses.  - She is going to get an atrial flutter ablation with Dr. Curt Bears at the end of the month. Postponed because of COVID.    3. Hypothyroidism: Suspect related to prior amiodarone use.   - Continue Levoxyl.  4. Elevated LFTs: Mild elevation in past, resolved in 11/21.  Abdominal US showed unremarkable liver.  ?Related to amiodarone.  She is now back on amiodarone.  She has seen GI. Most recent LFTs were normal.  - Check LFTs with next labs.  5. CAD: Calcification noted in coronaries with pulmonary vein CT done prior to afib ablation.  - Continue atorvastatin 40 daily, good lipids in 8/21.  - No ASA given apixaban use.   Followup in 1 month with APP.   Signed, [name], MD  06/23/2020  [facility] [place]  [id number] [phone] (office) [phone] (fax)

## 2020-06-23 NOTE — Patient Instructions (Addendum)
EKG done today.  Labs done today. We will contact you only if your labs are abnormal.  DECREASE Amiodarone to 200mg  (1 tablet) by mouth daily.  INCREASE Lasix to 40mg  (1 tablet) by mouth 2 times daily.  No other medication changes were made. Please continue all current medications as prescribed.  Your physician recommends that you schedule a follow-up appointment in: 10 days for a alb only appointment and in 1 month for an appointment with our [facility] here in our office.   If you have any questions or concerns before your next appointment please send [place] a message through [place] or call our office at [phone].    TO LEAVE A MESSAGE FOR THE NURSE SELECT OPTION 2, PLEASE LEAVE A MESSAGE INCLUDING: . YOUR NAME . DATE OF BIRTH . CALL BACK NUMBER . REASON FOR CALL**this is important as we prioritize the call backs  YOU WILL RECEIVE A CALL BACK THE SAME DAY AS LONG AS YOU CALL BEFORE 4:00 PM   Do the following things EVERYDAY: 1) Weigh yourself in the morning before breakfast. Write it down and keep it in a log. 2) Take your medicines as prescribed 3) Eat low salt foods--Limit salt (sodium) to 2000 mg per day.  4) Stay as active as you can everyday 5) Limit all fluids for the day to less than 2 liters   At the [facility], you and your health needs are our priority. As part of our continuing mission to provide you with exceptional heart care, we have created designated Provider Care Teams. These Care Teams include your primary Cardiologist (physician) and Advanced Practice Providers (APPs- Physician Assistants and Nurse Practitioners) who all work together to provide you with the care you need, when you need it.   You may see any of the following providers on your designated Care Team at your next follow up: Marland Kitchen Dr Glori Bickers . Dr [name] . Darrick Grinder, NP . Lyda Jester, PA . Audry Riles, PharmD   Please be sure to bring in all your  medications bottles to every appointment.

## 2020-06-23 NOTE — Telephone Encounter (Signed)
Patient was returning call back to Dr. Curt Bears nurse

## 2020-06-24 NOTE — Telephone Encounter (Signed)
We can schedule her by phone when she is cleared to proceed by Cardiology.

## 2020-06-24 NOTE — Telephone Encounter (Signed)
Patient was evaluated by Cardiology yesterday and must undergo a cardiac ablation in the end of March. They have instructed her to hold on colonoscopy for at leas 3 months.  She will call post ablation and we will assist her in scheduling for July or August.  Dr. Fuller Plan, besides obtaining cardiac clearance and anticoag clearance at the time, would you like to see her back in the office before setting up procedure?

## 2020-06-24 NOTE — Telephone Encounter (Signed)
Pt is requesting to reschedule her procedure at the hospital

## 2020-06-29 ENCOUNTER — Ambulatory Visit (INDEPENDENT_AMBULATORY_CARE_PROVIDER_SITE_OTHER): Payer: Medicare Other

## 2020-06-29 DIAGNOSIS — I5022 Chronic systolic (congestive) heart failure: Secondary | ICD-10-CM | POA: Diagnosis not present

## 2020-06-29 DIAGNOSIS — Z95810 Presence of automatic (implantable) cardiac defibrillator: Secondary | ICD-10-CM

## 2020-07-01 NOTE — Progress Notes (Signed)
EPIC Encounter for ICM Monitoring  Patient Name: EGAN SAHLIN is a 85 y.o. female Date: 07/01/2020 Primary Care Physican: [name], MD Primary Cardiologist:[place] Electrophysiologist:Taylor Bi-V Pacing: 66.5% 2/8/2022Weight: 129lbs  Clinical Status (25-Jun-2020 to 29-Jun-2020) AT/AF 63 episodes  Time in AT/AF 20.0 hr/day (83.2%)   Spoke with patient and reports feeling well at this time. Denies fluid symptoms.Scheduled for ablation on 07/15/2020 for Atrial Flutter by Dr Curt Bears.         Per Dr Claris Gladden 3/9 OV note, she has atrial flutter decreasing BP pacing and seems to correlate with worsening CHF.  Opitvol thoracic impedancesuggesting fluid levels normal.    Prescribed: Furosemide40 mgTake 1 tablet (40 mg total) by mouth every morning AND 0.5 tablets (20 mg total) every evening.  Labs: Scheduled BMET 07/03/2020  04/14/2020 Creatinine 1.32, BUN 17, Potassium 4.0, Sodium 134, GFR 40 03/24/2020 Creatinine 1.33, BUN 19, Potassium 3.5 Sodium 132, GFR 40 03/17/2020 Creatinine 1.28, BUN 20, Potassium 3.8, Sodium 133, GFR 42 12/12/2019 Creatinine 1.11, BUN 19, Potassium 3.8, Sodium 135, GFR 47-54 08/21/2019 Creatinine1.11, BUN18, Potassium4.5, Sodium132, GFR47-54 08/02/2019 Creatinine0.96, BUN20, Potassium3.9, Sodium136, GFR56-64 06/17/2019 Creatinine1.21, BUN23, Potassium4.0, Sodium133, GFR42-49 05/20/2019 Creatinine1.38, BUN19, Potassium3.8, [id number], LHT34-28 05/09/2019 Creatinine1.52, BUN21, Potassium4.1, Sodium130, JGO11-57 A complete set of results can be found in Results Review.  Recommendations:Recommendation to limit salt intake. Encouraged to call if experiencing any fluid symptoms.   Follow-up plan: ICM clinic phone appointment on4/18/2022. 91 day device clinic remote transmission4/03/2021.   EP/Cardiology Office Visits: 07/27/2020 with Dr Aundra Dubin. 10/13/2020 with Dr Curt Bears  Copy  of ICM check sent to Dr.Taylor.  3 month ICM trend: 06/29/2020.    1 Year ICM trend:       Rosalene Billings, RN 07/01/2020 1:16 PM

## 2020-07-03 ENCOUNTER — Ambulatory Visit (HOSPITAL_COMMUNITY)
Admission: RE | Admit: 2020-07-03 | Discharge: 2020-07-03 | Disposition: A | Discharge status: Home / Self Care | Payer: Medicare Other | Source: Ambulatory Visit | Attending: Cardiology | Admitting: Cardiology

## 2020-07-03 ENCOUNTER — Other Ambulatory Visit: Payer: Self-pay

## 2020-07-03 DIAGNOSIS — R059 Cough, unspecified: Secondary | ICD-10-CM | POA: Insufficient documentation

## 2020-07-03 DIAGNOSIS — I5022 Chronic systolic (congestive) heart failure: Secondary | ICD-10-CM

## 2020-07-03 LAB — COMPREHENSIVE METABOLIC PANEL
ALT: 39 U/L (ref 0–44)
AST: 59 U/L — ABNORMAL HIGH (ref 15–41)
Albumin: 3.5 g/dL (ref 3.5–5.0)
Alkaline Phosphatase: 89 U/L (ref 38–126)
Anion gap: 11 (ref 5–15)
BUN: 24 mg/dL — ABNORMAL HIGH (ref 8–23)
CO2: 25 mmol/L (ref 22–32)
Calcium: 8.7 mg/dL — ABNORMAL LOW (ref 8.9–10.3)
Chloride: 94 mmol/L — ABNORMAL LOW (ref 98–111)
Creatinine, Ser: 1.42 mg/dL — ABNORMAL HIGH (ref 0.44–1.00)
GFR, Estimated: 37 mL/min — ABNORMAL LOW (ref >60)
Glucose, Bld: 120 mg/dL — ABNORMAL HIGH (ref 70–99)
Potassium: 4 mmol/L (ref 3.5–5.1)
Sodium: 130 mmol/L — ABNORMAL LOW (ref 135–145)
Total Bilirubin: 1.6 mg/dL — ABNORMAL HIGH (ref 0.3–1.2)
Total Protein: 7.4 g/dL (ref 6.5–8.1)

## 2020-07-03 LAB — TSH: TSH: 4.994 u[IU]/mL — ABNORMAL HIGH (ref 0.350–4.500)

## 2020-07-06 ENCOUNTER — Telehealth (HOSPITAL_COMMUNITY): Payer: Self-pay | Admitting: Emergency Medicine

## 2020-07-06 NOTE — Telephone Encounter (Signed)
Reaching out to patient to offer assistance regarding upcoming cardiac imaging study; pt verbalizes understanding of appt date/time, parking situation and where to check in, pre-test NPO status and medications ordered, and verified current allergies; name and call back number provided for further questions should they arise Cynthia Bond RN Navigator Cardiac Imaging [facility] [phone] office [phone] cell   Pt informed of additional appt made for IV hydration for renal protection at 12 noon. Pt verbalized understanding. Cynthia Frank

## 2020-07-08 ENCOUNTER — Other Ambulatory Visit: Payer: Self-pay

## 2020-07-08 ENCOUNTER — Ambulatory Visit (HOSPITAL_COMMUNITY): Admission: RE | Admit: 2020-07-08 | Payer: Medicare Other | Source: Ambulatory Visit

## 2020-07-08 ENCOUNTER — Ambulatory Visit (HOSPITAL_COMMUNITY)
Admission: RE | Admit: 2020-07-08 | Discharge: 2020-07-08 | Disposition: A | Discharge status: Home / Self Care | Payer: Medicare Other | Source: Ambulatory Visit | Attending: Cardiology | Admitting: Cardiology

## 2020-07-08 DIAGNOSIS — I4819 Other persistent atrial fibrillation: Secondary | ICD-10-CM | POA: Insufficient documentation

## 2020-07-08 LAB — BASIC METABOLIC PANEL
Anion gap: 10 (ref 5–15)
BUN: 22 mg/dL (ref 8–23)
CO2: 26 mmol/L (ref 22–32)
Calcium: 8.7 mg/dL — ABNORMAL LOW (ref 8.9–10.3)
Chloride: 91 mmol/L — ABNORMAL LOW (ref 98–111)
Creatinine, Ser: 1.37 mg/dL — ABNORMAL HIGH (ref 0.44–1.00)
GFR, Estimated: 39 mL/min — ABNORMAL LOW (ref >60)
Glucose, Bld: 103 mg/dL — ABNORMAL HIGH (ref 70–99)
Potassium: 3.9 mmol/L (ref 3.5–5.1)
Sodium: 127 mmol/L — ABNORMAL LOW (ref 135–145)

## 2020-07-08 MED ORDER — SODIUM CHLORIDE 0.9 % WEIGHT BASED INFUSION: 1.0000 mL/kg/h | INTRAVENOUS | Status: DC

## 2020-07-08 MED ORDER — SODIUM CHLORIDE 0.9 % WEIGHT BASED INFUSION
3.0000 mL/kg/h | INTRAVENOUS | Status: AC
  Administered 2020-07-08: 3 mL/kg/h via INTRAVENOUS

## 2020-07-08 NOTE — Progress Notes (Signed)
Pt here for Cardiac Ct.  IVF started and site infiltrated after about 3 minutes. CT notified and IV team called

## 2020-07-09 ENCOUNTER — Telehealth: Payer: Self-pay | Admitting: Physician Assistant

## 2020-07-09 NOTE — Telephone Encounter (Signed)
   I am covering [employer] today. Dr. Macky Lower nurse, Judeen Hammans, reached out to let us know they could not get IV for this patient yesterday for CT so they are wanting a TEE on the table at time of patient's ablation which is scheduled for 3/30 at 8:30am. I spoke with Dr. Gasper Sells who is a rounder next week and he has graciously agreed to help perform the TEE. I reached out to Rennis Harding to aid in the process for scheduling this - per our discussion, I notified the cath lab front desk Santiago Glad) and also spoke with Melissa, head echo tech, to ensure an echo tech would be available to assist the day of the procedure. The only remaining items would be the order for TEE/TEE consent from the ordering provider. Sherri was going to assist with these orders but will need documentation from ordering MD that he consented patient for TEE. Will route this message to Dr. Curt Bears and Venida Jarvis so they are aware of these outstanding items.  Will also cc to Trish's inbox so she is aware this is taking place on 07/15/20.  Dayna Dunn PA-C

## 2020-07-10 NOTE — Telephone Encounter (Signed)
Patient Cynthia Frank need ECG the day of the procedure and if in sinus rhythm can cancel TEE.

## 2020-07-12 ENCOUNTER — Other Ambulatory Visit (HOSPITAL_COMMUNITY): Payer: Self-pay | Admitting: Cardiology

## 2020-07-13 ENCOUNTER — Other Ambulatory Visit (HOSPITAL_COMMUNITY)
Admission: RE | Admit: 2020-07-13 | Discharge: 2020-07-13 | Disposition: A | Discharge status: Home / Self Care | Payer: Medicare Other | Source: Ambulatory Visit | Attending: Cardiology | Admitting: Cardiology

## 2020-07-13 ENCOUNTER — Other Ambulatory Visit (HOSPITAL_COMMUNITY): Payer: Medicare Other

## 2020-07-13 DIAGNOSIS — Z01812 Encounter for preprocedural laboratory examination: Secondary | ICD-10-CM | POA: Insufficient documentation

## 2020-07-13 DIAGNOSIS — Z20822 Contact with and (suspected) exposure to covid-19: Secondary | ICD-10-CM | POA: Insufficient documentation

## 2020-07-13 LAB — SARS CORONAVIRUS 2 (TAT 6-24 HRS): SARS Coronavirus 2: NEGATIVE

## 2020-07-14 ENCOUNTER — Other Ambulatory Visit (HOSPITAL_COMMUNITY): Payer: Self-pay

## 2020-07-14 MED ORDER — FUROSEMIDE 40 MG PO TABS: 40.0000 mg | ORAL_TABLET | Freq: Two times a day (BID) | ORAL | 5 refills | Status: DC

## 2020-07-14 NOTE — Pre-Procedure Instructions (Signed)
Instructed patient on the following items: Arrival time 0630 Nothing to eat or drink after midnight No meds AM of procedure Responsible person to drive you home and stay with you for 24 hrs  Have you missed any doses of anti-coagulant Eliquis- hasn't missed any doses.   

## 2020-07-15 ENCOUNTER — Ambulatory Visit (HOSPITAL_COMMUNITY)
Admission: RE | Admit: 2020-07-15 | Discharge: 2020-07-15 | Disposition: A | Discharge status: Home / Self Care | Payer: Medicare Other | Source: Home / Self Care | Attending: Cardiology | Admitting: Cardiology

## 2020-07-15 ENCOUNTER — Ambulatory Visit (HOSPITAL_BASED_OUTPATIENT_CLINIC_OR_DEPARTMENT_OTHER)
Admission: RE | Admit: 2020-07-15 | Discharge: 2020-07-15 | Disposition: A | Discharge status: Home / Self Care | Payer: Medicare Other | Source: Home / Self Care | Attending: Internal Medicine | Admitting: Internal Medicine

## 2020-07-15 ENCOUNTER — Ambulatory Visit (HOSPITAL_COMMUNITY): Payer: Medicare Other | Admitting: Certified Registered Nurse Anesthetist

## 2020-07-15 ENCOUNTER — Other Ambulatory Visit: Payer: Self-pay

## 2020-07-15 ENCOUNTER — Inpatient Hospital Stay (HOSPITAL_COMMUNITY)
Admission: RE | Disposition: A | Discharge status: Home / Self Care | Payer: Self-pay | Source: Home / Self Care | Attending: Cardiology

## 2020-07-15 DIAGNOSIS — E119 Type 2 diabetes mellitus without complications: Secondary | ICD-10-CM | POA: Insufficient documentation

## 2020-07-15 DIAGNOSIS — I4891 Unspecified atrial fibrillation: Secondary | ICD-10-CM | POA: Diagnosis not present

## 2020-07-15 DIAGNOSIS — I4892 Unspecified atrial flutter: Secondary | ICD-10-CM | POA: Diagnosis not present

## 2020-07-15 DIAGNOSIS — Z881 Allergy status to other antibiotic agents status: Secondary | ICD-10-CM | POA: Insufficient documentation

## 2020-07-15 DIAGNOSIS — I361 Nonrheumatic tricuspid (valve) insufficiency: Secondary | ICD-10-CM | POA: Diagnosis not present

## 2020-07-15 DIAGNOSIS — I340 Nonrheumatic mitral (valve) insufficiency: Secondary | ICD-10-CM

## 2020-07-15 DIAGNOSIS — I428 Other cardiomyopathies: Secondary | ICD-10-CM | POA: Insufficient documentation

## 2020-07-15 DIAGNOSIS — I130 Hypertensive heart and chronic kidney disease with heart failure and stage 1 through stage 4 chronic kidney disease, or unspecified chronic kidney disease: Secondary | ICD-10-CM | POA: Diagnosis not present

## 2020-07-15 DIAGNOSIS — Z882 Allergy status to sulfonamides status: Secondary | ICD-10-CM | POA: Insufficient documentation

## 2020-07-15 DIAGNOSIS — Z87891 Personal history of nicotine dependence: Secondary | ICD-10-CM | POA: Insufficient documentation

## 2020-07-15 DIAGNOSIS — J9811 Atelectasis: Secondary | ICD-10-CM | POA: Diagnosis not present

## 2020-07-15 DIAGNOSIS — I083 Combined rheumatic disorders of mitral, aortic and tricuspid valves: Secondary | ICD-10-CM | POA: Insufficient documentation

## 2020-07-15 DIAGNOSIS — Z006 Encounter for examination for normal comparison and control in clinical research program: Secondary | ICD-10-CM | POA: Diagnosis not present

## 2020-07-15 DIAGNOSIS — I471 Supraventricular tachycardia: Secondary | ICD-10-CM | POA: Insufficient documentation

## 2020-07-15 DIAGNOSIS — I4819 Other persistent atrial fibrillation: Secondary | ICD-10-CM | POA: Insufficient documentation

## 2020-07-15 DIAGNOSIS — I5022 Chronic systolic (congestive) heart failure: Secondary | ICD-10-CM | POA: Diagnosis not present

## 2020-07-15 DIAGNOSIS — J90 Pleural effusion, not elsewhere classified: Secondary | ICD-10-CM | POA: Diagnosis not present

## 2020-07-15 DIAGNOSIS — Q211 Atrial septal defect: Secondary | ICD-10-CM | POA: Diagnosis not present

## 2020-07-15 DIAGNOSIS — I700 Atherosclerosis of aorta: Secondary | ICD-10-CM | POA: Insufficient documentation

## 2020-07-15 DIAGNOSIS — I2510 Atherosclerotic heart disease of native coronary artery without angina pectoris: Secondary | ICD-10-CM | POA: Diagnosis not present

## 2020-07-15 DIAGNOSIS — E876 Hypokalemia: Secondary | ICD-10-CM | POA: Diagnosis not present

## 2020-07-15 DIAGNOSIS — R21 Rash and other nonspecific skin eruption: Secondary | ICD-10-CM | POA: Diagnosis not present

## 2020-07-15 DIAGNOSIS — E871 Hypo-osmolality and hyponatremia: Secondary | ICD-10-CM | POA: Diagnosis not present

## 2020-07-15 DIAGNOSIS — I110 Hypertensive heart disease with heart failure: Secondary | ICD-10-CM | POA: Insufficient documentation

## 2020-07-15 DIAGNOSIS — R0602 Shortness of breath: Secondary | ICD-10-CM | POA: Diagnosis not present

## 2020-07-15 DIAGNOSIS — I081 Rheumatic disorders of both mitral and tricuspid valves: Secondary | ICD-10-CM | POA: Diagnosis not present

## 2020-07-15 DIAGNOSIS — Z95810 Presence of automatic (implantable) cardiac defibrillator: Secondary | ICD-10-CM | POA: Insufficient documentation

## 2020-07-15 DIAGNOSIS — I5023 Acute on chronic systolic (congestive) heart failure: Secondary | ICD-10-CM | POA: Diagnosis not present

## 2020-07-15 DIAGNOSIS — Z20822 Contact with and (suspected) exposure to covid-19: Secondary | ICD-10-CM | POA: Diagnosis not present

## 2020-07-15 DIAGNOSIS — I517 Cardiomegaly: Secondary | ICD-10-CM | POA: Diagnosis not present

## 2020-07-15 HISTORY — PX: ATRIAL FIBRILLATION ABLATION: EP1191

## 2020-07-15 HISTORY — PX: TEE WITHOUT CARDIOVERSION: SHX5443

## 2020-07-15 LAB — ECHO TEE
MV M vel: 3.72 m/s
MV Peak grad: 55.2 mmHg
Radius: 0.7 cm

## 2020-07-15 LAB — GLUCOSE, CAPILLARY
Glucose-Capillary: 111 mg/dL — ABNORMAL HIGH (ref 70–99)
Glucose-Capillary: 103 mg/dL — ABNORMAL HIGH (ref 70–99)

## 2020-07-15 LAB — POCT ACTIVATED CLOTTING TIME
Activated Clotting Time: 499 seconds
Activated Clotting Time: 517 seconds

## 2020-07-15 SURGERY — ATRIAL FIBRILLATION ABLATION
Anesthesia: General

## 2020-07-15 MED ORDER — SODIUM CHLORIDE 0.9 % IV SOLN: 250.0000 mL | INTRAVENOUS | Status: DC | PRN

## 2020-07-15 MED ORDER — FENTANYL CITRATE (PF) 250 MCG/5ML IJ SOLN
INTRAMUSCULAR | Status: DC | PRN
  Administered 2020-07-15: 50 ug via INTRAVENOUS

## 2020-07-15 MED ORDER — SODIUM CHLORIDE 0.9 % IV BOLUS: 250.0000 mL | Freq: Once | INTRAVENOUS | Status: DC

## 2020-07-15 MED ORDER — HEPARIN (PORCINE) IN NACL 1000-0.9 UT/500ML-% IV SOLN
INTRAVENOUS | Status: AC
  Filled 2020-07-15: qty 500

## 2020-07-15 MED ORDER — PHENYLEPHRINE HCL-NACL 10-0.9 MG/250ML-% IV SOLN
INTRAVENOUS | Status: DC | PRN
  Administered 2020-07-15: 25 ug/min via INTRAVENOUS

## 2020-07-15 MED ORDER — ETOMIDATE 2 MG/ML IV SOLN
INTRAVENOUS | Status: DC | PRN
  Administered 2020-07-15: 8 mg via INTRAVENOUS

## 2020-07-15 MED ORDER — ACETAMINOPHEN 325 MG PO TABS
650.0000 mg | ORAL_TABLET | ORAL | Status: DC | PRN
Start: 2020-07-15 — End: 2020-07-15

## 2020-07-15 MED ORDER — ACETAMINOPHEN 500 MG PO TABS
ORAL_TABLET | ORAL | Status: AC
  Filled 2020-07-15: qty 2

## 2020-07-15 MED ORDER — ONDANSETRON HCL 4 MG/2ML IJ SOLN
INTRAMUSCULAR | Status: DC | PRN
  Administered 2020-07-15: 4 mg via INTRAVENOUS

## 2020-07-15 MED ORDER — DOBUTAMINE IN D5W 4-5 MG/ML-% IV SOLN
INTRAVENOUS | Status: DC | PRN
  Administered 2020-07-15: 20 ug/kg/min via INTRAVENOUS

## 2020-07-15 MED ORDER — CEFAZOLIN SODIUM-DEXTROSE 2-3 GM-%(50ML) IV SOLR
INTRAVENOUS | Status: DC | PRN
  Administered 2020-07-15: 2 g via INTRAVENOUS

## 2020-07-15 MED ORDER — ACETAMINOPHEN 500 MG PO TABS
1000.0000 mg | ORAL_TABLET | Freq: Once | ORAL | Status: AC
  Administered 2020-07-15: 1000 mg via ORAL

## 2020-07-15 MED ORDER — HEPARIN SODIUM (PORCINE) 1000 UNIT/ML IJ SOLN
INTRAMUSCULAR | Status: AC
  Filled 2020-07-15: qty 1

## 2020-07-15 MED ORDER — HEPARIN SODIUM (PORCINE) 1000 UNIT/ML IJ SOLN
INTRAMUSCULAR | Status: DC | PRN
  Administered 2020-07-15: 1000 [IU] via INTRAVENOUS

## 2020-07-15 MED ORDER — DOBUTAMINE IN D5W 4-5 MG/ML-% IV SOLN
INTRAVENOUS | Status: AC
  Filled 2020-07-15: qty 250

## 2020-07-15 MED ORDER — HEPARIN (PORCINE) IN NACL 1000-0.9 UT/500ML-% IV SOLN
INTRAVENOUS | Status: DC | PRN
  Administered 2020-07-15 (×5): 500 mL

## 2020-07-15 MED ORDER — CEFAZOLIN SODIUM-DEXTROSE 2-4 GM/100ML-% IV SOLN
INTRAVENOUS | Status: AC
  Filled 2020-07-15: qty 100

## 2020-07-15 MED ORDER — LACTATED RINGERS IV SOLN: INTRAVENOUS | Status: DC | PRN

## 2020-07-15 MED ORDER — FENTANYL CITRATE (PF) 250 MCG/5ML IJ SOLN
INTRAMUSCULAR | Status: AC
  Filled 2020-07-15: qty 5

## 2020-07-15 MED ORDER — SODIUM CHLORIDE 0.9 % IV SOLN: INTRAVENOUS | Status: DC

## 2020-07-15 MED ORDER — ROCURONIUM BROMIDE 10 MG/ML (PF) SYRINGE
PREFILLED_SYRINGE | INTRAVENOUS | Status: DC | PRN
  Administered 2020-07-15: 40 mg via INTRAVENOUS

## 2020-07-15 MED ORDER — SODIUM CHLORIDE 0.9% FLUSH: 3.0000 mL | Freq: Two times a day (BID) | INTRAVENOUS | Status: DC

## 2020-07-15 MED ORDER — PHENYLEPHRINE HCL (PRESSORS) 10 MG/ML IV SOLN
INTRAVENOUS | Status: DC | PRN
  Administered 2020-07-15 (×2): 80 ug via INTRAVENOUS
  Administered 2020-07-15 (×2): 40 ug via INTRAVENOUS

## 2020-07-15 MED ORDER — SODIUM CHLORIDE 0.9% FLUSH: 3.0000 mL | INTRAVENOUS | Status: DC | PRN

## 2020-07-15 MED ORDER — SUGAMMADEX SODIUM 200 MG/2ML IV SOLN
INTRAVENOUS | Status: DC | PRN
  Administered 2020-07-15 (×2): 100 mg via INTRAVENOUS

## 2020-07-15 MED ORDER — APIXABAN 2.5 MG PO TABS
2.5000 mg | ORAL_TABLET | ORAL | Status: AC
  Administered 2020-07-15: 2.5 mg via ORAL
  Filled 2020-07-15: qty 1

## 2020-07-15 MED ORDER — ONDANSETRON HCL 4 MG/2ML IJ SOLN: 4.0000 mg | Freq: Four times a day (QID) | INTRAMUSCULAR | Status: DC | PRN

## 2020-07-15 MED ORDER — PROTAMINE SULFATE 10 MG/ML IV SOLN
INTRAVENOUS | Status: DC | PRN
  Administered 2020-07-15: 40 mg via INTRAVENOUS
  Administered 2020-07-15: 10 mg via INTRAVENOUS
  Administered 2020-07-15: 30 mg via INTRAVENOUS

## 2020-07-15 MED ORDER — HEPARIN SODIUM (PORCINE) 1000 UNIT/ML IJ SOLN
INTRAMUSCULAR | Status: DC | PRN
  Administered 2020-07-15: 14000 [IU] via INTRAVENOUS

## 2020-07-15 SURGICAL SUPPLY — 21 items
BAG SNAP BAND KOVER 36X36 (MISCELLANEOUS) ×2 IMPLANT
BLANKET WARM UNDERBOD FULL ACC (MISCELLANEOUS) ×2 IMPLANT
CATH 8FR REPROCESSED SOUNDSTAR (CATHETERS) ×2 IMPLANT
CATH MAPPNG PENTARAY F 2-6-2MM (CATHETERS) ×1 IMPLANT
CATH S CIRCA THERM PROBE 10F (CATHETERS) ×2 IMPLANT
CATH SMTCH THERMOCOOL SF DF (CATHETERS) ×2 IMPLANT
CATH WEB BI DIR CSDF CRV REPRO (CATHETERS) ×2 IMPLANT
CLOSURE PERCLOSE PROSTYLE (VASCULAR PRODUCTS) ×8 IMPLANT
COVER SWIFTLINK CONNECTOR (BAG) ×2 IMPLANT
KIT VERSACROSS STEERABLE D1 (CATHETERS) ×2 IMPLANT
PACK EP LATEX FREE (CUSTOM PROCEDURE TRAY) ×2
PACK EP LF (CUSTOM PROCEDURE TRAY) ×1 IMPLANT
PAD PRO RADIOLUCENT 2001M-C (PAD) ×2 IMPLANT
PATCH CARTO3 (PAD) ×2 IMPLANT
PENTARAY F 2-6-2MM (CATHETERS) ×2
SHEATH CARTO VIZIGO SM CVD (SHEATH) ×2 IMPLANT
SHEATH PINNACLE 7F 10CM (SHEATH) ×2 IMPLANT
SHEATH PINNACLE 8F 10CM (SHEATH) ×4 IMPLANT
SHEATH PINNACLE 9F 10CM (SHEATH) ×2 IMPLANT
SHEATH PROBE COVER 6X72 (BAG) ×2 IMPLANT
TUBING SMART ABLATE COOLFLOW (TUBING) ×2 IMPLANT

## 2020-07-15 NOTE — Discharge Instructions (Signed)
Post procedure care instructions No driving for 4 days. No lifting over 5 lbs for 1 week. No vigorous or sexual activity for 1 week. You may return to work/your usual activities on 07/22/20. Keep procedure site clean & dry. If you notice increased pain, swelling, bleeding or pus, call/return!  You may shower after 24 hours, but no soaking in baths/hot tubs/pools for 1 week.    You have an appointment set up with the [facility].  Multiple studies have shown that being followed by a dedicated atrial fibrillation clinic in addition to the standard care you receive from your other physicians improves health. We believe that enrollment in the atrial fibrillation clinic will allow [place] to better care for you.   The phone number to the [facility] is [phone]. The clinic is staffed Monday through Friday from 8:30am to 5pm.  Parking Directions: The clinic is located in the Heart and Vascular Building connected to [facility]. 1)From [address] turn on to [address] and go to the 3rd entrance  (Heart and Vascular entrance) on the right. 2)Look to the right for Heart &Vascular Parking Garage. 3)A code for the entrance is required, for April is 2231.   4)Take the elevators to the 1st floor. Registration is in the room with the glass walls at the end of the hallway.  If you have any trouble parking or locating the clinic, please don't hesitate to call [phone].     Cardiac Ablation, Care After  This sheet gives you information about how to care for yourself after your procedure. Your health care provider may also give you more specific instructions. If you have problems or questions, contact your health care provider. What can I expect after the procedure? After the procedure, it is common to have:  Bruising around your puncture site.  Tenderness around your puncture site.  Skipped heartbeats.  Tiredness (fatigue).  Follow these instructions at  home: Puncture site care   Follow instructions from your health care provider about how to take care of your puncture site. Make sure you: ? If present, leave stitches (sutures), skin glue, or adhesive strips in place. These skin closures may need to stay in place for up to 2 weeks. If adhesive strip edges start to loosen and curl up, you may trim the loose edges. Do not remove adhesive strips completely unless your health care provider tells you to do that. ? If a large square bandage is present, this may be removed 24 hours after surgery.   Check your puncture site every day for signs of infection. Check for: ? Redness, swelling, or pain. ? Fluid or blood. If your puncture site starts to bleed, lie down on your back, apply firm pressure to the area, and contact your health care provider. ? Warmth. ? Pus or a bad smell. Driving  Do not drive for at least 4 days after your procedure or however long your health care provider recommends. (Do not resume driving if you have previously been instructed not to drive for other health reasons.)  Do not drive or use heavy machinery while taking prescription pain medicine. Activity  Avoid activities that take a lot of effort for at least 7 days after your procedure.  Do not lift anything that is heavier than 5 lb (4.5 kg) for one week.   No sexual activity for 1 week.   Return to your normal activities as told by your health care provider. Ask your health care provider  what activities are safe for you. General instructions  Take over-the-counter and prescription medicines only as told by your health care provider.  Do not use any products that contain nicotine or tobacco, such as cigarettes and e-cigarettes. If you need help quitting, ask your health care provider.  You may shower after 24 hours, but Do not take baths, swim, or use a hot tub for 1 week.   Do not drink alcohol for 24 hours after your procedure.  Keep all follow-up visits as  told by your health care provider. This is important. Contact a health care provider if:  You have redness, mild swelling, or pain around your puncture site.  You have fluid or blood coming from your puncture site that stops after applying firm pressure to the area.  Your puncture site feels warm to the touch.  You have pus or a bad smell coming from your puncture site.  You have a fever.  You have chest pain or discomfort that spreads to your neck, jaw, or arm.  You are sweating a lot.  You feel nauseous.  You have a fast or irregular heartbeat.  You have shortness of breath.  You are dizzy or light-headed and feel the need to lie down.  You have pain or numbness in the arm or leg closest to your puncture site. Get help right away if:  Your puncture site suddenly swells.  Your puncture site is bleeding and the bleeding does not stop after applying firm pressure to the area. These symptoms may represent a serious problem that is an emergency. Do not wait to see if the symptoms will go away. Get medical help right away. Call your local emergency services (911 in the [place] Do not drive yourself to the hospital. Summary  After the procedure, it is normal to have bruising and tenderness at the puncture site in your groin, neck, or forearm.  Check your puncture site every day for signs of infection.  Get help right away if your puncture site is bleeding and the bleeding does not stop after applying firm pressure to the area. This is a medical emergency. This information is not intended to replace advice given to you by your health care provider. Make sure you discuss any questions you have with your health care provider.

## 2020-07-15 NOTE — CV Procedure (Signed)
    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 The risks, benefits and alternatives for the procedure were discussed and the patient comprehended these risks.  Risks include, but are not limited to, cough, sore throat, vomiting, nausea, somnolence, esophageal and stomach trauma or perforation, bleeding, low blood pressure, aspiration, pneumonia, infection, trauma to the teeth and death.    Procedural time out performed. The oropharynx was anesthetized with topical 1% benzocaine.    Anesthesia was administered by anesthesia team.   The patient's heart rate, blood pressure, and oxygen saturation are monitored continuously during the procedure. The period of anesthesia is 25 minutes, of which I was present face-to-face 100% of this time; anesthesia continued for atrial fibrillation ablation.  The transesophageal probe was inserted in the esophagus and stomach without difficulty and multiple views were obtained.   COMPLICATIONS:    There were no immediate complications.  KEY FINDINGS:  1. Severe mitral regurgitation with moderate to severe tricuspid regurgitation.  2. Full report to follow. 3. Further management per primary team.  4.   Measurements to be included in final report  Rudean Haskell, MD [place]  9:20 AM

## 2020-07-15 NOTE — Progress Notes (Signed)
  Echocardiogram Echocardiogram Transesophageal has been performed.  Cynthia Frank 07/15/2020, 9:45 AM

## 2020-07-15 NOTE — Interval H&P Note (Signed)
History and Physical Interval Note:  07/15/2020 8:28 AM  Cynthia Frank  has presented today for surgery, with the diagnosis of afib.  The various methods of treatment have been discussed with the patient and family. After consideration of risks, benefits and other options for treatment, the patient has consented to  Procedure(s): ATRIAL FIBRILLATION ABLATION (N/A) TRANSESOPHAGEAL ECHOCARDIOGRAM (TEE) (N/A) as a surgical intervention.  The patient's history has been reviewed, patient examined, no change in status, stable for surgery.  I have reviewed the patient's chart and labs.  Questions were answered to the patient's satisfaction.     Myeshia Fojtik A Lothar Prehn

## 2020-07-15 NOTE — Progress Notes (Signed)
pts  bp was reported to be soft and had an IV bolus. Pt is asymptomatic, reviewed prior and there is no change, pt states she feels good. No signs of distress.

## 2020-07-15 NOTE — Transfer of Care (Signed)
Immediate Anesthesia Transfer of Care Note  Patient: KELE WITHEM  Procedure(s) Performed: ATRIAL FIBRILLATION ABLATION (N/A ) TRANSESOPHAGEAL ECHOCARDIOGRAM (TEE) (N/A )  Patient Location: Cath Lab  Anesthesia Type:General  Level of Consciousness: awake, alert , oriented, patient cooperative and responds to stimulation  Airway & Oxygen Therapy: Patient Spontanous Breathing and Patient connected to face mask oxygen  Post-op Assessment: Report given to RN and Post -op Vital signs reviewed and stable  Post vital signs: Reviewed and stable  Last Vitals:  Vitals Value Taken Time  BP 96/65 07/15/20 1057  Temp    Pulse 61 07/15/20 1102  Resp 21 07/15/20 1102  SpO2 98 % 07/15/20 1102  Vitals shown include unvalidated device data.  Last Pain:  Vitals:   07/15/20 0656  PainSc: 0-No pain      Patients Stated Pain Goal: 2 (77/11/65 7903)  Complications: No complications documented.

## 2020-07-15 NOTE — Anesthesia Preprocedure Evaluation (Addendum)
Anesthesia Evaluation  Patient identified by MRN, date of birth, ID band Patient awake    Reviewed: Allergy & Precautions, H&P , NPO status , Patient's Chart, lab work & pertinent test results, reviewed documented beta blocker date and time   Airway Mallampati: II  TM Distance: >3 FB Neck ROM: Full    Dental no notable dental hx. (+) Teeth Intact, Dental Advisory Given   Pulmonary neg pulmonary ROS, former smoker,    Pulmonary exam normal breath sounds clear to auscultation       Cardiovascular hypertension, Pt. on medications and Pt. on home beta blockers + CAD and +CHF  + dysrhythmias Atrial Fibrillation + Cardiac Defibrillator  Rhythm:Irregular Rate:Normal     Neuro/Psych negative neurological ROS  negative psych ROS   GI/Hepatic Neg liver ROS, GERD  Medicated,  Endo/Other  diabetes, Type 2, Oral Hypoglycemic Agents  Renal/GU negative Renal ROS  negative genitourinary   Musculoskeletal  (+) Arthritis , Osteoarthritis,    Abdominal   Peds  Hematology negative hematology ROS (+)   Anesthesia Other Findings   Reproductive/Obstetrics negative OB ROS                            Anesthesia Physical Anesthesia Plan  ASA: IV  Anesthesia Plan: General   Post-op Pain Management:    Induction: Intravenous  PONV Risk Score and Plan: 4 or greater and Ondansetron, Dexamethasone and Treatment may vary due to age or medical condition  Airway Management Planned: Oral ETT  Additional Equipment:   Intra-op Plan:   Post-operative Plan: Extubation in OR  Informed Consent: I have reviewed the patients History and Physical, chart, labs and discussed the procedure including the risks, benefits and alternatives for the proposed anesthesia with the patient or authorized representative who has indicated his/her understanding and acceptance.     Dental advisory given  Plan Discussed with:  CRNA  Anesthesia Plan Comments:        Anesthesia Quick Evaluation

## 2020-07-15 NOTE — Anesthesia Procedure Notes (Signed)
Procedure Name: Intubation Date/Time: 07/15/2020 8:48 AM Performed by: Glynda Jaeger, CRNA Pre-anesthesia Checklist: Patient identified, Patient being monitored, Timeout performed, Emergency Drugs available and Suction available Patient Re-evaluated:Patient Re-evaluated prior to induction Oxygen Delivery Method: Circle System Utilized Preoxygenation: Pre-oxygenation with 100% oxygen Induction Type: IV induction Ventilation: Mask ventilation without difficulty Laryngoscope Size: 3 and Glidescope Grade View: Grade I Tube type: Oral Tube size: 7.0 mm Number of attempts: 1 Airway Equipment and Method: Stylet Placement Confirmation: ETT inserted through vocal cords under direct vision,  positive ETCO2 and breath sounds checked- equal and bilateral Secured at: 21 cm Tube secured with: Tape Dental Injury: Teeth and Oropharynx as per pre-operative assessment  Comments: Elective glidescope r/t loose front tooth.

## 2020-07-15 NOTE — H&P (Signed)
Electrophysiology Office Note   Date:  07/15/2020   ID:  Cynthia, Frank [birth year], MRN [id number]  PCP:  [name], MD  Cardiologist:  Aundra Dubin Primary Electrophysiologist:  Savvy Peeters Meredith Leeds, MD    Chief Complaint: CHF   History of Present Illness: Cynthia Frank is a 85 y.o. female who is being seen today for the evaluation of CHF at the request of No ref. provider found. Presenting today for electrophysiology evaluation.  She has a history significant for chronic systolic heart failure due to nonischemic cardiomyopathy, paroxysmal atrial fibrillation, ovale and and colon cancers.  She has had a cardiomyopathy for approximately 15 years.  It was thought this was due to Adriamycin.  She is status post Medtronic CRT-D implanted 07/06/2015.  She was having an increased atrial fibrillation burden and is now status post ablation 08/14/2019.  Today, denies symptoms of palpitations, chest pain, shortness of breath, orthopnea, PND, lower extremity edema, claudication, dizziness, presyncope, syncope, bleeding, or neurologic sequela. The patient is tolerating medications without difficulties. Plan for AF ablation today.    Past Medical History:  Diagnosis Date  . AICD (automatic cardioverter/defibrillator) present   . Arthritis    "left ankle" (07/14/2015)  . Atrial fibrillation ([place])   . Cataract    bilateral -not a surgical candidate  . CHF (congestive heart failure) ([place])   . Colon cancer ([place]) 03/2006   T3, N0  . Colon polyps 12/07/2010  . Coronary artery disease    nonobstructive with 30% D1  . Diabetes mellitus without complication ([place])    on meds  . GERD (gastroesophageal reflux disease)    OTC PRN -diet controlled  . History of ovarian cancer 1985  . Hx: UTI (urinary tract infection)   . Hyperlipidemia    on meds  . Hypertension    on meds  . Internal hemorrhoid   . Nonischemic cardiomyopathy ([place])    last EF assessment 40% by MUGA  .  Osteoporosis    Prolia once per year  . Partial bowel obstruction ([place])   . PVC (premature ventricular contraction)   . Thyroid disease    on meds   Past Surgical History:  Procedure Laterality Date  . ABDOMINAL HYSTERECTOMY  1979  . ANKLE CLOSED REDUCTION  10/16/2011   Procedure: CLOSED REDUCTION ANKLE;  Surgeon: Tobi Bastos, MD;  Location: [facility];  Service: Orthopedics;  Laterality: Left;  . ATRIAL FIBRILLATION ABLATION N/A 08/14/2019   Procedure: ATRIAL FIBRILLATION ABLATION;  Surgeon: Constance Haw, MD;  Location: [facility];  Service: Cardiovascular;  Laterality: N/A;  . CARDIOVERSION N/A 04/20/2020   Procedure: CARDIOVERSION;  Surgeon: Larey Dresser, MD;  Location: [facility] ENDOSCOPY;  Service: Cardiovascular;  Laterality: N/A;  . COLECTOMY  2007   for colon cancer  . COLONOSCOPY N/A 03/11/2013   Procedure: COLONOSCOPY;  Surgeon: Ladene Artist, MD;  Location: [facility];  Service: Endoscopy;  Laterality: N/A;  . COLONOSCOPY  2019   MS-plenvu(good)-int hems-SSA x 2  . COLONOSCOPY WITH PROPOFOL N/A 04/21/2015   Procedure: COLONOSCOPY WITH PROPOFOL;  Surgeon: Ladene Artist, MD;  Location: [facility];  Service: Endoscopy;  Laterality: N/A;  . COLONOSCOPY WITH PROPOFOL N/A 02/05/2018   Procedure: COLONOSCOPY WITH PROPOFOL Hemostasis Clip;  Surgeon: Ladene Artist, MD;  Location: [facility];  Service: Endoscopy;  Laterality: N/A;  . EP IMPLANTABLE DEVICE N/A 07/14/2015   Procedure: BiV ICD Insertion CRT-D;  Surgeon: Evans Lance, MD;  Location: [facility];  Service: Cardiovascular;  Laterality: N/A;  . ESOPHAGOGASTRODUODENOSCOPY (EGD) WITH PROPOFOL N/A 04/21/2015   Procedure: ESOPHAGOGASTRODUODENOSCOPY (EGD) WITH PROPOFOL;  Surgeon: Ladene Artist, MD;  Location: [facility];  Service: Endoscopy;  Laterality: N/A;  . FRACTURE SURGERY    . INSERTION OF ICD Left 07/14/2015  . LAPAROTOMY  1980   "adhesions"  . LAPAROTOMY  1989   laparotomy for takedown  of intestinal obstruction secondary to adhesions   . OOPHORECTOMY  1961  . OVARY SURGERY Right 1985   resection of right ovarian cancer  . POLYPECTOMY  02/05/2018   Procedure: POLYPECTOMY;  Surgeon: Ladene Artist, MD;  Location: [facility];  Service: Endoscopy;;  . POLYPECTOMY  2018   SSA x 2  . SMALL INTESTINE SURGERY  1985  . TEE WITHOUT CARDIOVERSION N/A 08/14/2019   Procedure: TRANSESOPHAGEAL ECHOCARDIOGRAM (TEE);  Surgeon: Constance Haw, MD;  Location: [facility];  Service: Cardiovascular;  Laterality: N/A;  . TONSILLECTOMY  1944  . TOTAL HIP ARTHROPLASTY Right 12/18/2018   Procedure: TOTAL HIP ARTHROPLASTY ANTERIOR APPROACH;  Surgeon: Rod Can, MD;  Location: [facility];  Service: Orthopedics;  Laterality: Right;     Current Facility-Administered Medications  Medication Dose Route Frequency Provider Last Rate Last Admin  . 0.9 %  sodium chloride infusion   Intravenous Continuous Mikenzi Raysor Hassell Done, MD      . acetaminophen (TYLENOL) 500 MG tablet           . acetaminophen (TYLENOL) tablet 1,000 mg  1,000 mg Oral Once Roderic Palau, MD        Allergies:   Clindamycin/lincomycin, Dramamine ii [meclizine hcl], Pneumococcal vaccines, and Sulfonamide derivatives   Social History:  The patient  reports that she quit smoking about 42 years ago. Her smoking use included cigarettes. She has a 10.00 pack-year smoking history. She has never used smokeless tobacco. She reports current alcohol use of about 14.0 standard drinks of alcohol per week. She reports that she does not use drugs.   Family History:  The patient's family history includes Heart attack in her father; Heart disease in her father; Prostate cancer in her father; Uterine cancer in her mother.   ROS:  Please see the history of present illness.   Otherwise, review of systems is positive for none.   All other systems are reviewed and negative.   PHYSICAL EXAM: VS:  BP 94/66   Pulse 82   Temp (!)  97.4 F (36.3 C)   Resp 20   Ht 5\' 4"  (1.626 m)   Wt 57.2 kg   SpO2 99%   BMI 21.63 kg/m  , BMI Body mass index is 21.63 kg/m. GEN: Well nourished, well developed, in no acute distress  HEENT: normal  Neck: no JVD, carotid bruits, or masses Cardiac: RRR; no murmurs, rubs, or gallops,no edema  Respiratory:  clear to auscultation bilaterally, normal work of breathing GI: soft, nontender, nondistended, + BS MS: no deformity or atrophy  Skin: warm and dry Neuro:  Strength and sensation are intact Psych: euthymic mood, full affect  EKG:  EKG is ordered today. Personal review of the ekg ordered  shows atrial flutter, rate 85    Recent Labs: 06/18/2020: Hemoglobin 12.2; Platelets 142 07/03/2020: ALT 39; TSH 4.994 07/08/2020: BUN 22; Creatinine, Ser 1.37; Potassium 3.9; Sodium 127    Lipid Panel     Component Value Date/Time   CHOL 150 12/12/2019 1123   TRIG 60 12/12/2019 1123   HDL 86  12/12/2019 1123   CHOLHDL 1.7 12/12/2019 1123   VLDL 12 12/12/2019 1123   LDLCALC 52 12/12/2019 1123     Wt Readings from Last 3 Encounters:  07/15/20 57.2 kg  07/08/20 55.3 kg  06/23/20 57.8 kg      Other studies Reviewed: Additional studies/ records that were reviewed today include: TTE 12/16/18  Review of the above records today demonstrates:  1. The left ventricle has mildly reduced systolic function, with an  ejection fraction of 45-50%. The cavity size was normal. Left ventricular  diastolic Doppler parameters are indeterminate. No evidence of left  ventricular regional wall motion  abnormalities.  2. The right ventricle has normal systolic function. The cavity was  normal. There is no increase in right ventricular wall thickness. Right  ventricular systolic pressure is moderately elevated.  3. Left atrial size was mildly dilated.  4. There is mild mitral annular calcification present.  5. Tricuspid valve regurgitation is mild-moderate.  6. The aorta is normal unless  otherwise noted.  7. Ventricular pacing noted. No visible A wave on mitral inflow or on  mitral annulus tissue Doppler suggests atrial fibrillation at the time of  the study (or atrial mechanical failure). This limits the accuracy of  diastolic function assessment.  8. When compared to the prior study: 12/16/2015, there is improved left  ventricular systolic function due to improved lateral-septal synchrony by  CRT pacing. Despite this, there is worsened pulmonary artery hypertension.    ASSESSMENT AND PLAN:  1.  Chronic systolic heart failure due to nonischemic cardiomyopathy: Ejection fraction improved to 45 to 50%.  Class NYHA II symptoms.  Currently on Toprol-XL and Aldactone.  Has not tolerated Entresto in the past.  Plan per heart failure team.    2.  Paroxysmal atrial fibrillation/atrial tachycardia/atrial flutter: Cynthia Frank has presented today for surgery, with the diagnosis of atrial fibrillation/flutter.  The various methods of treatment have been discussed with the patient and family. After consideration of risks, benefits and other options for treatment, the patient has consented to  Procedure(s): Catheter ablation as a surgical intervention .  Risks include but not limited to complete heart block, stroke, esophageal damage, nerve damage, bleeding, vascular damage, tamponade, perforation, MI, and death. The patient's history has been reviewed, patient examined, no change in status, stable for surgery.  I have reviewed the patient's chart and labs.  Questions were answered to the patient's satisfaction.    Laydon Martis Curt Bears, MD 07/15/2020 6:55 AM

## 2020-07-15 NOTE — Anesthesia Postprocedure Evaluation (Signed)
Anesthesia Post Note  Patient: Cynthia Frank  Procedure(s) Performed: ATRIAL FIBRILLATION ABLATION (N/A ) TRANSESOPHAGEAL ECHOCARDIOGRAM (TEE) (N/A )     Patient location during evaluation: Cath Lab Anesthesia Type: General Level of consciousness: sedated and patient cooperative Pain management: pain level controlled Vital Signs Assessment: post-procedure vital signs reviewed and stable Respiratory status: spontaneous breathing Cardiovascular status: stable Anesthetic complications: no   No complications documented.  Last Vitals:  Vitals:   07/15/20 1330 07/15/20 1400  BP: (!) 83/41 (!) 78/43  Pulse: 64 61  Resp: 16 15  Temp:    SpO2: 94% 96%    Last Pain:  Vitals:   07/15/20 1408  TempSrc:   PainSc: 0-No pain                 Nolon Nations

## 2020-07-16 ENCOUNTER — Encounter (HOSPITAL_COMMUNITY): Payer: Self-pay | Admitting: Cardiology

## 2020-07-16 ENCOUNTER — Telehealth: Payer: Self-pay

## 2020-07-16 MED FILL — Cefazolin Sodium-Dextrose IV Solution 2 GM/100ML-4%: INTRAVENOUS | Qty: 100 | Status: AC

## 2020-07-16 NOTE — Progress Notes (Signed)
Cynthia Frank DOB [birth year]  This looks like a clippable valve. The fossa looks approachable for transseptal puncture in both the SAXB and Bicaval views. Pacing leads and TR are noted. The left atrium is dilated and is large enough for device steering and straddle. The MR jet is centrally located. The posterior leaflet is measuring 1.25 cm in the 130 LVOT view. MVA measures 4.80 cm2 from the Transgastric view and the gradient is 1 mmHg. Based on this information, I'd recommend starting with an NTW and assessing for gradient.

## 2020-07-16 NOTE — Telephone Encounter (Signed)
  [facility]   MULTIDISCIPLINARY HEART VALVE TEAM  Pt underwent AFib ablation yesterday and when TEE was performed Severe MR was noted. Dr Curt Bears reviewed this information with Dr Burt Knack and recommended referral to Structural Heart team.  I contacted the pt by phone today and reviewed this information with the pt. The pt was aware of findings yesterday but at this time she does not wish to schedule an evaluation with Dr Burt Knack. The pt said she has a number of appointments already on her calendar and she does not want to have anymore procedures performed.  The pt has a pending appointment on 4/11 with the Heart Failure team and they can also discuss these findings at her visit and make recommendations.  The pt agreed to allow me to contact her in May to discuss the possibility of arranging evaluation with Dr Burt Knack.

## 2020-07-18 ENCOUNTER — Encounter (HOSPITAL_COMMUNITY): Payer: Self-pay

## 2020-07-18 ENCOUNTER — Emergency Department (HOSPITAL_COMMUNITY): Payer: Medicare Other

## 2020-07-18 ENCOUNTER — Other Ambulatory Visit: Payer: Self-pay

## 2020-07-18 ENCOUNTER — Inpatient Hospital Stay (HOSPITAL_COMMUNITY)
Admission: EM | Admit: 2020-07-18 | Discharge: 2020-07-24 | DRG: 266 | Disposition: A | Discharge status: Home / Self Care | Payer: Medicare Other | Attending: Internal Medicine | Admitting: Internal Medicine

## 2020-07-18 DIAGNOSIS — I5022 Chronic systolic (congestive) heart failure: Secondary | ICD-10-CM | POA: Diagnosis not present

## 2020-07-18 DIAGNOSIS — Z87891 Personal history of nicotine dependence: Secondary | ICD-10-CM

## 2020-07-18 DIAGNOSIS — Z79899 Other long term (current) drug therapy: Secondary | ICD-10-CM

## 2020-07-18 DIAGNOSIS — Z8616 Personal history of COVID-19: Secondary | ICD-10-CM

## 2020-07-18 DIAGNOSIS — I2510 Atherosclerotic heart disease of native coronary artery without angina pectoris: Secondary | ICD-10-CM | POA: Diagnosis not present

## 2020-07-18 DIAGNOSIS — I4892 Unspecified atrial flutter: Secondary | ICD-10-CM | POA: Diagnosis not present

## 2020-07-18 DIAGNOSIS — I340 Nonrheumatic mitral (valve) insufficiency: Secondary | ICD-10-CM | POA: Diagnosis not present

## 2020-07-18 DIAGNOSIS — J90 Pleural effusion, not elsewhere classified: Secondary | ICD-10-CM | POA: Diagnosis not present

## 2020-07-18 DIAGNOSIS — J9811 Atelectasis: Secondary | ICD-10-CM | POA: Diagnosis not present

## 2020-07-18 DIAGNOSIS — M1990 Unspecified osteoarthritis, unspecified site: Secondary | ICD-10-CM | POA: Diagnosis present

## 2020-07-18 DIAGNOSIS — Z9049 Acquired absence of other specified parts of digestive tract: Secondary | ICD-10-CM

## 2020-07-18 DIAGNOSIS — I700 Atherosclerosis of aorta: Secondary | ICD-10-CM | POA: Diagnosis present

## 2020-07-18 DIAGNOSIS — N1831 Chronic kidney disease, stage 3a: Secondary | ICD-10-CM | POA: Diagnosis present

## 2020-07-18 DIAGNOSIS — I5082 Biventricular heart failure: Secondary | ICD-10-CM | POA: Diagnosis present

## 2020-07-18 DIAGNOSIS — R7401 Elevation of levels of liver transaminase levels: Secondary | ICD-10-CM | POA: Diagnosis present

## 2020-07-18 DIAGNOSIS — I130 Hypertensive heart and chronic kidney disease with heart failure and stage 1 through stage 4 chronic kidney disease, or unspecified chronic kidney disease: Secondary | ICD-10-CM | POA: Diagnosis not present

## 2020-07-18 DIAGNOSIS — Z95810 Presence of automatic (implantable) cardiac defibrillator: Secondary | ICD-10-CM | POA: Diagnosis not present

## 2020-07-18 DIAGNOSIS — E039 Hypothyroidism, unspecified: Secondary | ICD-10-CM | POA: Diagnosis present

## 2020-07-18 DIAGNOSIS — I5043 Acute on chronic combined systolic (congestive) and diastolic (congestive) heart failure: Secondary | ICD-10-CM | POA: Diagnosis not present

## 2020-07-18 DIAGNOSIS — T451X5A Adverse effect of antineoplastic and immunosuppressive drugs, initial encounter: Secondary | ICD-10-CM | POA: Diagnosis present

## 2020-07-18 DIAGNOSIS — Z7901 Long term (current) use of anticoagulants: Secondary | ICD-10-CM

## 2020-07-18 DIAGNOSIS — Z8543 Personal history of malignant neoplasm of ovary: Secondary | ICD-10-CM

## 2020-07-18 DIAGNOSIS — E785 Hyperlipidemia, unspecified: Secondary | ICD-10-CM | POA: Diagnosis present

## 2020-07-18 DIAGNOSIS — Z20822 Contact with and (suspected) exposure to covid-19: Secondary | ICD-10-CM | POA: Diagnosis not present

## 2020-07-18 DIAGNOSIS — E871 Hypo-osmolality and hyponatremia: Secondary | ICD-10-CM | POA: Diagnosis not present

## 2020-07-18 DIAGNOSIS — M810 Age-related osteoporosis without current pathological fracture: Secondary | ICD-10-CM | POA: Diagnosis present

## 2020-07-18 DIAGNOSIS — Z79890 Hormone replacement therapy (postmenopausal): Secondary | ICD-10-CM

## 2020-07-18 DIAGNOSIS — Q211 Atrial septal defect: Secondary | ICD-10-CM

## 2020-07-18 DIAGNOSIS — E611 Iron deficiency: Secondary | ICD-10-CM | POA: Diagnosis present

## 2020-07-18 DIAGNOSIS — Z006 Encounter for examination for normal comparison and control in clinical research program: Secondary | ICD-10-CM | POA: Diagnosis not present

## 2020-07-18 DIAGNOSIS — R7989 Other specified abnormal findings of blood chemistry: Secondary | ICD-10-CM | POA: Diagnosis present

## 2020-07-18 DIAGNOSIS — I5023 Acute on chronic systolic (congestive) heart failure: Secondary | ICD-10-CM | POA: Diagnosis not present

## 2020-07-18 DIAGNOSIS — Z8049 Family history of malignant neoplasm of other genital organs: Secondary | ICD-10-CM

## 2020-07-18 DIAGNOSIS — K219 Gastro-esophageal reflux disease without esophagitis: Secondary | ICD-10-CM | POA: Diagnosis present

## 2020-07-18 DIAGNOSIS — Z8249 Family history of ischemic heart disease and other diseases of the circulatory system: Secondary | ICD-10-CM

## 2020-07-18 DIAGNOSIS — Z8042 Family history of malignant neoplasm of prostate: Secondary | ICD-10-CM

## 2020-07-18 DIAGNOSIS — I081 Rheumatic disorders of both mitral and tricuspid valves: Secondary | ICD-10-CM | POA: Diagnosis not present

## 2020-07-18 DIAGNOSIS — D631 Anemia in chronic kidney disease: Secondary | ICD-10-CM | POA: Diagnosis present

## 2020-07-18 DIAGNOSIS — I428 Other cardiomyopathies: Secondary | ICD-10-CM | POA: Diagnosis present

## 2020-07-18 DIAGNOSIS — I4891 Unspecified atrial fibrillation: Secondary | ICD-10-CM | POA: Diagnosis not present

## 2020-07-18 DIAGNOSIS — I4819 Other persistent atrial fibrillation: Secondary | ICD-10-CM | POA: Diagnosis not present

## 2020-07-18 DIAGNOSIS — E876 Hypokalemia: Secondary | ICD-10-CM | POA: Diagnosis not present

## 2020-07-18 DIAGNOSIS — Z95818 Presence of other cardiac implants and grafts: Secondary | ICD-10-CM

## 2020-07-18 DIAGNOSIS — I10 Essential (primary) hypertension: Secondary | ICD-10-CM | POA: Diagnosis present

## 2020-07-18 DIAGNOSIS — Z96641 Presence of right artificial hip joint: Secondary | ICD-10-CM | POA: Diagnosis present

## 2020-07-18 DIAGNOSIS — R54 Age-related physical debility: Secondary | ICD-10-CM | POA: Diagnosis present

## 2020-07-18 DIAGNOSIS — I509 Heart failure, unspecified: Secondary | ICD-10-CM | POA: Diagnosis not present

## 2020-07-18 DIAGNOSIS — R21 Rash and other nonspecific skin eruption: Secondary | ICD-10-CM | POA: Diagnosis not present

## 2020-07-18 DIAGNOSIS — Z90710 Acquired absence of both cervix and uterus: Secondary | ICD-10-CM

## 2020-07-18 DIAGNOSIS — Z85038 Personal history of other malignant neoplasm of large intestine: Secondary | ICD-10-CM

## 2020-07-18 DIAGNOSIS — I2583 Coronary atherosclerosis due to lipid rich plaque: Secondary | ICD-10-CM | POA: Diagnosis not present

## 2020-07-18 DIAGNOSIS — E1122 Type 2 diabetes mellitus with diabetic chronic kidney disease: Secondary | ICD-10-CM | POA: Diagnosis present

## 2020-07-18 DIAGNOSIS — I480 Paroxysmal atrial fibrillation: Secondary | ICD-10-CM | POA: Diagnosis not present

## 2020-07-18 DIAGNOSIS — I5020 Unspecified systolic (congestive) heart failure: Secondary | ICD-10-CM | POA: Diagnosis present

## 2020-07-18 DIAGNOSIS — D649 Anemia, unspecified: Secondary | ICD-10-CM | POA: Diagnosis present

## 2020-07-18 DIAGNOSIS — I517 Cardiomegaly: Secondary | ICD-10-CM | POA: Diagnosis not present

## 2020-07-18 DIAGNOSIS — Z8719 Personal history of other diseases of the digestive system: Secondary | ICD-10-CM

## 2020-07-18 DIAGNOSIS — Z9889 Other specified postprocedural states: Secondary | ICD-10-CM

## 2020-07-18 DIAGNOSIS — I110 Hypertensive heart disease with heart failure: Secondary | ICD-10-CM | POA: Diagnosis not present

## 2020-07-18 DIAGNOSIS — I083 Combined rheumatic disorders of mitral, aortic and tricuspid valves: Secondary | ICD-10-CM | POA: Diagnosis not present

## 2020-07-18 DIAGNOSIS — R0602 Shortness of breath: Secondary | ICD-10-CM | POA: Diagnosis not present

## 2020-07-18 DIAGNOSIS — R531 Weakness: Secondary | ICD-10-CM | POA: Diagnosis not present

## 2020-07-18 DIAGNOSIS — Z87440 Personal history of urinary (tract) infections: Secondary | ICD-10-CM

## 2020-07-18 DIAGNOSIS — Z954 Presence of other heart-valve replacement: Secondary | ICD-10-CM | POA: Diagnosis not present

## 2020-07-18 DIAGNOSIS — I959 Hypotension, unspecified: Secondary | ICD-10-CM | POA: Diagnosis not present

## 2020-07-18 LAB — COMPREHENSIVE METABOLIC PANEL
ALT: 76 U/L — ABNORMAL HIGH (ref 0–44)
AST: 123 U/L — ABNORMAL HIGH (ref 15–41)
Albumin: 2.8 g/dL — ABNORMAL LOW (ref 3.5–5.0)
Alkaline Phosphatase: 102 U/L (ref 38–126)
Anion gap: 9 (ref 5–15)
BUN: 25 mg/dL — ABNORMAL HIGH (ref 8–23)
CO2: 24 mmol/L (ref 22–32)
Calcium: 7.9 mg/dL — ABNORMAL LOW (ref 8.9–10.3)
Chloride: 90 mmol/L — ABNORMAL LOW (ref 98–111)
Creatinine, Ser: 1.46 mg/dL — ABNORMAL HIGH (ref 0.44–1.00)
GFR, Estimated: 36 mL/min — ABNORMAL LOW (ref >60)
Glucose, Bld: 111 mg/dL — ABNORMAL HIGH (ref 70–99)
Potassium: 3.7 mmol/L (ref 3.5–5.1)
Sodium: 123 mmol/L — ABNORMAL LOW (ref 135–145)
Total Bilirubin: 1.4 mg/dL — ABNORMAL HIGH (ref 0.3–1.2)
Total Protein: 6.2 g/dL — ABNORMAL LOW (ref 6.5–8.1)

## 2020-07-18 LAB — CBC WITH DIFFERENTIAL/PLATELET
Abs Immature Granulocytes: 0.07 10*3/uL (ref 0.00–0.07)
Basophils Absolute: 0 10*3/uL (ref 0.0–0.1)
Basophils Relative: 1 %
Eosinophils Absolute: 0 10*3/uL (ref 0.0–0.5)
Eosinophils Relative: 1 %
HCT: 34 % — ABNORMAL LOW (ref 36.0–46.0)
Hemoglobin: 10.9 g/dL — ABNORMAL LOW (ref 12.0–15.0)
Immature Granulocytes: 1 %
Lymphocytes Relative: 11 %
Lymphs Abs: 0.6 10*3/uL — ABNORMAL LOW (ref 0.7–4.0)
MCH: 27.5 pg (ref 26.0–34.0)
MCHC: 32.1 g/dL (ref 30.0–36.0)
MCV: 85.6 fL (ref 80.0–100.0)
Monocytes Absolute: 0.4 10*3/uL (ref 0.1–1.0)
Monocytes Relative: 7 %
Neutro Abs: 4.8 10*3/uL (ref 1.7–7.7)
Neutrophils Relative %: 79 %
Platelets: 147 10*3/uL — ABNORMAL LOW (ref 150–400)
RBC: 3.97 MIL/uL (ref 3.87–5.11)
RDW: 18.4 % — ABNORMAL HIGH (ref 11.5–15.5)
WBC: 6 10*3/uL (ref 4.0–10.5)
nRBC: 0 % (ref 0.0–0.2)

## 2020-07-18 LAB — TROPONIN I (HIGH SENSITIVITY): Troponin I (High Sensitivity): 24 ng/L — ABNORMAL HIGH (ref <18)

## 2020-07-18 LAB — BRAIN NATRIURETIC PEPTIDE: B Natriuretic Peptide: 2412.6 pg/mL — ABNORMAL HIGH (ref 0.0–100.0)

## 2020-07-18 LAB — MAGNESIUM: Magnesium: 2.2 mg/dL (ref 1.7–2.4)

## 2020-07-18 MED ORDER — FUROSEMIDE 10 MG/ML IJ SOLN
40.0000 mg | Freq: Once | INTRAMUSCULAR | Status: AC
  Administered 2020-07-19: 40 mg via INTRAVENOUS
  Filled 2020-07-18: qty 4

## 2020-07-18 NOTE — ED Notes (Signed)
Pacemaker interrogated at this time. Data has been sent and will await fax.

## 2020-07-18 NOTE — ED Provider Notes (Signed)
[facility] Provider Note   CSN: [id number] Arrival date & time: 07/18/20  1912     History Chief Complaint  Patient presents with  . Weakness    Cynthia Frank is a 85 y.o. female.  Patient is an 85 year old female with a history significant for HFrEF with ejection fraction estimated to be 20 to 25%, atrial fibrillation, history of colon cancer, with pacemaker, and recent ablation performed on March 30 who presents with concerns for feeling weak and "off."  Patient states that she has felt this way since her procedure.  She noticed it mostly when she gets up.  She states she feels somewhat dizzy and lightheaded.  She has not experienced any chest pain but does feel short of breath with exertion.  She says she normally feels like this, however this is worse than normal.  She denies any focal weakness.  She denies any abnormal sensations in her extremities.  She has not had any visual changes.  She denies a sensation that the room is spinning.  She states that she is always had trouble with balance and this has not increased over the last few days.  She does take Lasix twice a day and has been compliant with this regimen over the last few days.  Patient denies any leg swelling and states she is at her dry weight.  Reports compliance with other medications and treatments.        Past Medical History:  Diagnosis Date  . AICD (automatic cardioverter/defibrillator) present   . Arthritis    "left ankle" (07/14/2015)  . Atrial fibrillation ([place])   . Cataract    bilateral -not a surgical candidate  . CHF (congestive heart failure) ([place])   . Colon cancer ([place]) 03/2006   T3, N0  . Colon polyps 12/07/2010  . Coronary artery disease    nonobstructive with 30% D1  . Diabetes mellitus without complication ([place])    on meds  . GERD (gastroesophageal reflux disease)    OTC PRN -diet controlled  . History of ovarian cancer 1985  . Hx: UTI (urinary  tract infection)   . Hyperlipidemia    on meds  . Hypertension    on meds  . Internal hemorrhoid   . Nonischemic cardiomyopathy ([place])    last EF assessment 40% by MUGA  . Osteoporosis    Prolia once per year  . Partial bowel obstruction ([place])   . PVC (premature ventricular contraction)   . Thyroid disease    on meds    Patient Active Problem List   Diagnosis Date Noted  . Heart failure with reduced ejection fraction ([place]) 07/18/2020  . Hyponatremia 07/18/2020  . Anemia 07/18/2020  . Elevated LFTs 07/18/2020  . Severe mitral regurgitation   . Vertigo 03/27/2019  . Orthostatic hypotension   . Syncope and collapse   . Closed right hip fracture ([place]) 12/16/2018  . Hypokalemia 12/16/2018  . Dysphoric mood 08/11/2017  . Insomnia 04/21/2016  . Arthritis 04/21/2016  . Chronic systolic CHF (congestive heart failure) ([place]) 06/03/2015  . GERD (gastroesophageal reflux disease) 04/21/2015  . Personal history of colon cancer   . Routine general medical examination at a health care facility 03/24/2015  . Nonischemic cardiomyopathy ([place])   . Coronary artery disease   . Hyperlipidemia   . Atrial fibrillation ([place]) 12/11/2012  . Cough 05/16/2012  . THYROID NODULE 04/27/2007  . Essential hypertension 04/27/2007    Past Surgical History:  Procedure Laterality Date  .  ABDOMINAL HYSTERECTOMY  1979  . ANKLE CLOSED REDUCTION  10/16/2011   Procedure: CLOSED REDUCTION ANKLE;  Surgeon: Tobi Bastos, MD;  Location: [facility];  Service: Orthopedics;  Laterality: Left;  . ATRIAL FIBRILLATION ABLATION N/A 08/14/2019   Procedure: ATRIAL FIBRILLATION ABLATION;  Surgeon: Constance Haw, MD;  Location: [facility];  Service: Cardiovascular;  Laterality: N/A;  . ATRIAL FIBRILLATION ABLATION N/A 07/15/2020   Procedure: ATRIAL FIBRILLATION ABLATION;  Surgeon: Constance Haw, MD;  Location: [facility];  Service: Cardiovascular;  Laterality: N/A;  . CARDIOVERSION N/A 04/20/2020    Procedure: CARDIOVERSION;  Surgeon: Larey Dresser, MD;  Location: [facility] ENDOSCOPY;  Service: Cardiovascular;  Laterality: N/A;  . COLECTOMY  2007   for colon cancer  . COLONOSCOPY N/A 03/11/2013   Procedure: COLONOSCOPY;  Surgeon: Ladene Artist, MD;  Location: [facility];  Service: Endoscopy;  Laterality: N/A;  . COLONOSCOPY  2019   MS-plenvu(good)-int hems-SSA x 2  . COLONOSCOPY WITH PROPOFOL N/A 04/21/2015   Procedure: COLONOSCOPY WITH PROPOFOL;  Surgeon: Ladene Artist, MD;  Location: [facility];  Service: Endoscopy;  Laterality: N/A;  . COLONOSCOPY WITH PROPOFOL N/A 02/05/2018   Procedure: COLONOSCOPY WITH PROPOFOL Hemostasis Clip;  Surgeon: Ladene Artist, MD;  Location: [facility];  Service: Endoscopy;  Laterality: N/A;  . EP IMPLANTABLE DEVICE N/A 07/14/2015   Procedure: BiV ICD Insertion CRT-D;  Surgeon: Evans Lance, MD;  Location: [place] [facility];  Service: Cardiovascular;  Laterality: N/A;  . ESOPHAGOGASTRODUODENOSCOPY (EGD) WITH PROPOFOL N/A 04/21/2015   Procedure: ESOPHAGOGASTRODUODENOSCOPY (EGD) WITH PROPOFOL;  Surgeon: Ladene Artist, MD;  Location[facility];  Service: Endoscopy;  Laterality: N/A;  . FRACTURE SURGERY    . INSERTION OF ICD Left 07/14/2015  . LAPAROTOMY  1980   "adhesions"  . LAPAROTOMY  1989   laparotomy for takedown of intestinal obstruction secondary to adhesions   . OOPHORECTOMY  1961  . OVARY SURGERY Right 1985   resection of right ovarian cancer  . POLYPECTOMY  02/05/2018   Procedure: POLYPECTOMY;  Surgeon: Ladene Artist, MD;  Location: [facility];  Service: Endoscopy;;  . POLYPECTOMY  2018   SSA x 2  . SMALL INTESTINE SURGERY  1985  . TEE WITHOUT CARDIOVERSION N/A 08/14/2019   Procedure: TRANSESOPHAGEAL ECHOCARDIOGRAM (TEE);  Surgeon: Constance Haw, MD;  Location: [place] [facility];  Service: Cardiovascular;  Laterality: N/A;  . TEE WITHOUT CARDIOVERSION N/A 07/15/2020   Procedure: TRANSESOPHAGEAL ECHOCARDIOGRAM (TEE);   Surgeon: Constance Haw, MD;  Location: [facility];  Service: Cardiovascular;  Laterality: N/A;  . TONSILLECTOMY  1944  . TOTAL HIP ARTHROPLASTY Right 12/18/2018   Procedure: TOTAL HIP ARTHROPLASTY ANTERIOR APPROACH;  Surgeon: Rod Can, MD;  Location: [facility];  Service: Orthopedics;  Laterality: Right;     OB History   No obstetric history on file.     Family History  Problem Relation Age of Onset  . Uterine cancer Mother   . Prostate cancer Father   . Heart disease Father   . Heart attack Father   . Colon cancer Neg Hx   . Colon polyps Neg Hx   . Esophageal cancer Neg Hx   . Rectal cancer Neg Hx   . Stomach cancer Neg Hx     Social History   Tobacco Use  . Smoking status: Former Smoker    Packs/day: 0.50    Years: 20.00    Pack years: 10.00  Types: Cigarettes    Quit date: 04/18/1978    Years since quitting: 42.2  . Smokeless tobacco: Never Used  Vaping Use  . Vaping Use: Never used  Substance Use Topics  . Alcohol use: Yes    Alcohol/week: 14.0 standard drinks    Types: 14 Standard drinks or equivalent per week    Comment: has wine before dinner   . Drug use: No    Home Medications Prior to Admission medications   Medication Sig Start Date End Date Taking? Authorizing Provider  acetaminophen (TYLENOL) 500 MG tablet Take 500 mg by mouth every 6 (six) hours as needed for moderate pain or headache.    [provider]  amiodarone (PACERONE) 200 MG tablet Take 1 tablet (200 mg total) by mouth daily. 06/23/20   Larey Dresser, MD  atorvastatin (LIPITOR) 40 MG tablet TAKE 1 TABLET(40 MG) BY MOUTH DAILY AT 6 PM Patient taking differently: Take 40 mg by mouth daily. 12/13/19   Camnitz, Ocie Doyne, MD  clobetasol (TEMOVATE) 0.05 % external solution Apply 1 application topically 2 (two) times daily as needed (irritation). 06/03/20   [provider]  Coenzyme Q10 (COQ10) 100 MG CAPS Take 100 mg by mouth daily.    [provider]   conjugated estrogens (PREMARIN) vaginal cream Place 1 Applicatorful vaginally daily as needed (dyspareunia (Use as directed)).     [provider]  dapagliflozin propanediol (FARXIGA) 10 MG TABS tablet Take 1 tablet (10 mg total) by mouth daily before breakfast. 03/17/20   Larey Dresser, MD  diclofenac Sodium (VOLTAREN) 1 % GEL Apply 2 g topically 2 (two) times daily as needed (pain).    [provider]  ELIQUIS 2.5 MG TABS tablet TAKE 1 TABLET(2.5 MG) BY MOUTH TWICE DAILY Patient taking differently: Take 2.5 mg by mouth 2 (two) times daily. 02/17/20   Camnitz, Ocie Doyne, MD  furosemide (LASIX) 40 MG tablet Take 1 tablet (40 mg total) by mouth 2 (two) times daily. 07/14/20   Larey Dresser, MD  ipratropium (ATROVENT) 0.03 % nasal spray Place 1 spray into both nostrils 2 (two) times daily. 04/26/20   [provider]  ketoconazole (NIZORAL) 2 % shampoo Apply 1 application topically daily as needed (Psoriasis). 12/13/19   [provider]  levothyroxine (SYNTHROID) 25 MCG tablet TAKE 1 TABLET(25 MCG) BY MOUTH DAILY BEFORE BREAKFAST Patient taking differently: Take 25 mcg by mouth daily before breakfast. 03/23/20   Larey Dresser, MD  Magnesium Oxide 500 MG CAPS Take 1,000 mg by mouth 2 (two) times daily.     [provider]  metoprolol succinate (TOPROL-XL) 25 MG 24 hr tablet Take 1 tablet (25 mg total) by mouth at bedtime. 06/17/19   Larey Dresser, MD  potassium chloride SA (KLOR-CON) 20 MEQ tablet Take 1 tablet (20 mEq total) by mouth daily. 03/17/20   Larey Dresser, MD  sertraline (ZOLOFT) 25 MG tablet TAKE 1 TABLET(25 MG) BY MOUTH DAILY Patient taking differently: Take 25 mg by mouth daily. 07/14/20   Larey Dresser, MD  spironolactone (ALDACTONE) 25 MG tablet TAKE 1 TABLET(25 MG) BY MOUTH AT BEDTIME Patient taking differently: Take 25 mg by mouth at bedtime. 07/14/20   Larey Dresser, MD  zolpidem (AMBIEN) 5 MG tablet Take 5 mg by mouth at  bedtime as needed for sleep.    [provider]    Allergies    Clindamycin/lincomycin, Dramamine ii [meclizine hcl], Pneumococcal vaccines, and Sulfonamide derivatives  Review  of Systems   Review of Systems  Constitutional: Negative for chills and fever.  HENT: Negative for ear pain and sore throat.   Eyes: Negative for pain and visual disturbance.  Respiratory: Positive for shortness of breath. Negative for cough.   Cardiovascular: Negative for chest pain and palpitations.  Gastrointestinal: Negative for abdominal pain and vomiting.  Genitourinary: Negative for dysuria and hematuria.  Musculoskeletal: Negative for arthralgias and back pain.  Skin: Negative for color change and rash.  Neurological: Positive for dizziness and light-headedness. Negative for seizures and syncope.  All other systems reviewed and are negative.   Physical Exam Updated Vital Signs BP 96/70   Pulse 93   Temp 97.6 F (36.4 C) (Oral)   Resp 19   Ht 5\' 4"  (1.626 m)   Wt 57.2 kg   SpO2 96%   BMI 21.63 kg/m   Physical Exam Vitals and nursing note reviewed.  Constitutional:      General: She is not in acute distress.    Appearance: She is well-developed.  HENT:     Head: Normocephalic and atraumatic.     Right Ear: External ear normal.     Left Ear: External ear normal.  Eyes:     Extraocular Movements: Extraocular movements intact.     Pupils: Pupils are equal, round, and reactive to light.  Cardiovascular:     Rate and Rhythm: Normal rate and regular rhythm.  Pulmonary:     Effort: Pulmonary effort is normal.     Breath sounds: Rales present.     Comments: rales at the base of the lungs bilaterally Abdominal:     Palpations: Abdomen is soft.     Tenderness: There is no abdominal tenderness.  Musculoskeletal:     Cervical back: Neck supple.     Right lower leg: No edema.     Left lower leg: Edema present.  Skin:    General: Skin is warm and dry.     Capillary Refill:  Capillary refill takes less than 2 seconds.  Neurological:     General: No focal deficit present.     Mental Status: She is alert and oriented to person, place, and time.  Psychiatric:        Mood and Affect: Mood normal.        Behavior: Behavior normal.     ED Results / Procedures / Treatments   Labs (all labs ordered are listed, but only abnormal results are displayed) Labs Reviewed  COMPREHENSIVE METABOLIC PANEL - Abnormal; Notable for the following components:      Result Value   Sodium 123 (*)    Chloride 90 (*)    Glucose, Bld 111 (*)    BUN 25 (*)    Creatinine, Ser 1.46 (*)    Calcium 7.9 (*)    Total Protein 6.2 (*)    Albumin 2.8 (*)    AST 123 (*)    ALT 76 (*)    Total Bilirubin 1.4 (*)    GFR, Estimated 36 (*)    All other components within normal limits  CBC WITH DIFFERENTIAL/PLATELET - Abnormal; Notable for the following components:   Hemoglobin 10.9 (*)    HCT 34.0 (*)    RDW 18.4 (*)    Platelets 147 (*)    Lymphs Abs 0.6 (*)    All other components within normal limits  BRAIN NATRIURETIC PEPTIDE - Abnormal; Notable for the following components:   B Natriuretic Peptide 2,412.6 (*)    All  other components within normal limits  TROPONIN I (HIGH SENSITIVITY) - Abnormal; Notable for the following components:   Troponin I (High Sensitivity) 24 (*)    All other components within normal limits  RESP PANEL BY RT-PCR (FLU A&B, COVID) ARPGX2  MAGNESIUM  URINALYSIS, ROUTINE W REFLEX MICROSCOPIC  OSMOLALITY, URINE  OSMOLALITY  SODIUM, URINE, RANDOM  TSH  VITAMIN B12  FOLATE  IRON AND TIBC  FERRITIN  RETICULOCYTES  PHOSPHORUS  PREALBUMIN  LACTIC ACID, PLASMA  CK  HEPATITIS PANEL, ACUTE  TROPONIN I (HIGH SENSITIVITY)    EKG EKG Interpretation  Date/Time:  Saturday July 18 2020 19:18:44 EDT Ventricular Rate:  96 PR Interval:  151 QRS Duration: 169 QT Interval:  462 QTC Calculation: 584 R Axis:   -85 Text Interpretation: Atrial-sensed  ventricular-paced rhythm No further analysis attempted due to paced rhythm Confirmed by Carmin Muskrat [phone]) on 07/18/2020 7:47:57 PM   Radiology DG Chest Portable 1 View  Result Date: 07/18/2020 CLINICAL DATA:  84 year old female with shortness of breath. EXAM: PORTABLE CHEST 1 VIEW COMPARISON:  Chest radiograph dated 05/09/2019. FINDINGS: Left lung base linear atelectasis/scarring. No focal consolidation or pneumothorax. Trace left pleural effusion is suspected. There is cardiomegaly. Atherosclerotic calcification of the aorta. Left pectoral AICD device. No acute osseous pathology. IMPRESSION: 1. Trace left pleural effusion and left lung base atelectasis. 2. Cardiomegaly. Electronically Signed   By: Anner Crete M.D.   On: 07/18/2020 20:38    Procedures Procedures   Medications Ordered in ED Medications  furosemide (LASIX) injection 40 mg (has no administration in time range)    ED Course  I have reviewed the triage vital signs and the nursing notes.  Pertinent labs & imaging results that were available during my care of the patient were reviewed by me and considered in my medical decision making (see chart for details).    MDM Rules/Calculators/A&P                          85 year old female who presents with feeling dyspneic on exertion and abnormal for the past few days after her cardiac ablation which was on Wednesday.  She is hemodynamically stable.  Blood pressure appropriate.  Afebrile.  Paced at a rate of 93.  EKG without changes from prior.  Saturating 96% on room air.  Without any tachypnea.  Work-up significant for elevated BNP and physical exam concerning for bilateral crackles concerning for heart failure exacerbation.  Lasix ordered. CMP reveals hyponatremia, elevated creatinine, elevated LFTs.  Troponin mildly elevated at 24 likely secondary to demand ischemia.  Overall presentation concerning for volume overload.  Patient will need additional diuresis with strict  fluid monitoring. COVID test sent.  Cardiology consulted for consult admission.  Patient to be admitted to the cardiology service for further work-up and evaluation of her presenting symptoms.  Final Clinical Impression(s) / ED Diagnoses Final diagnoses:  Weakness  Acute on chronic systolic heart failure [facility])    Rx / DC Orders ED Discharge Orders    None       Felma Pfefferle, [place], MD 07/18/20 2356    Carmin Muskrat, MD 07/19/20 [phone]

## 2020-07-18 NOTE — H&P (Incomplete)
Cardiology Admission History and Physical:   Patient ID: Cynthia Frank MRN: [id number]; DOB: 04-Sep-1935   Admission date: 07/18/2020  Primary Care Provider: [name], MD Primary Cardiologist: No primary care provider on file.  Primary Electrophysiologist:  Will Meredith Leeds, MD  Chief Complaint:  "feeling off"  Patient Profile:   Cynthia Frank is a 85 y.o. female with niCM, HFrEF (25%), DM, HTN, HLD, hypothyroid, and AF s/p recent ablation (07/15/20) who presents with "feeling off."  History of Present Illness:   Cynthia Frank underwent AF ablation with Dr Curt Bears on 07/15/20. She states she has not been feeling well since that time. Describes just feeling generally unwell. Hasn't had much energy. Has had exertional shortness of breath that she believes might be slightly worse than her baseline SOB. Denies any LE edema or change in weight. No fevers, chills, chest pain, nausea, vomiting, diarrhea. Has been taking all of her medications as prescribed, including her diuretic. Eating and drinking normally. No syncope, presyncope.   Patient brought to ED for evaluation. In the ED, she was found to be in a regular, V paced rhythm. Blood pressures with systolics in the 54'Y, which she states is her baseline. No other vital sign abnormalities. Her labs were significant for Na 123  Heart Pathway Score:     Past Medical History:  Diagnosis Date  . AICD (automatic cardioverter/defibrillator) present   . Arthritis    "left ankle" (07/14/2015)  . Atrial fibrillation ([place])   . Cataract    bilateral -not a surgical candidate  . CHF (congestive heart failure) ([place])   . Colon cancer ([place]) 03/2006   T3, N0  . Colon polyps 12/07/2010  . Coronary artery disease    nonobstructive with 30% D1  . Diabetes mellitus without complication ([place])    on meds  . GERD (gastroesophageal reflux disease)    OTC PRN -diet controlled  . History of ovarian cancer 1985  . Hx: UTI (urinary  tract infection)   . Hyperlipidemia    on meds  . Hypertension    on meds  . Internal hemorrhoid   . Nonischemic cardiomyopathy ([place])    last EF assessment 40% by MUGA  . Osteoporosis    Prolia once per year  . Partial bowel obstruction ([place])   . PVC (premature ventricular contraction)   . Thyroid disease    on meds    Past Surgical History:  Procedure Laterality Date  . ABDOMINAL HYSTERECTOMY  1979  . ANKLE CLOSED REDUCTION  10/16/2011   Procedure: CLOSED REDUCTION ANKLE;  Surgeon: Tobi Bastos, MD;  Location: [facility];  Service: Orthopedics;  Laterality: Left;  . ATRIAL FIBRILLATION ABLATION N/A 08/14/2019   Procedure: ATRIAL FIBRILLATION ABLATION;  Surgeon: Constance Haw, MD;  Location: [facility];  Service: Cardiovascular;  Laterality: N/A;  . ATRIAL FIBRILLATION ABLATION N/A 07/15/2020   Procedure: ATRIAL FIBRILLATION ABLATION;  Surgeon: Constance Haw, MD;  Location: [facility];  Service: Cardiovascular;  Laterality: N/A;  . CARDIOVERSION N/A 04/20/2020   Procedure: CARDIOVERSION;  Surgeon: Larey Dresser, MD;  [facility] ENDOSCOPY;  Service: Cardiovascular;  Laterality: N/A;  . COLECTOMY  2007   for colon cancer  . COLONOSCOPY N/A 03/11/2013   Procedure: COLONOSCOPY;  Surgeon: Ladene Artist, MD;  Location: [facility] ENDOSCOPY;  Service: Endoscopy;  Laterality: N/A;  . COLONOSCOPY  2019   MS-plenvu(good)-int hems-SSA x 2  . COLONOSCOPY WITH PROPOFOL N/A 04/21/2015   Procedure: COLONOSCOPY WITH  PROPOFOL;  Surgeon: Ladene Artist, MD;  Location: [facility];  Service: Endoscopy;  Laterality: N/A;  . COLONOSCOPY WITH PROPOFOL N/A 02/05/2018   Procedure: COLONOSCOPY WITH PROPOFOL Hemostasis Clip;  Surgeon: Ladene Artist, MD;  Location: [facility] ENDOSCOPY;  Service: Endoscopy;  Laterality: N/A;  . EP IMPLANTABLE DEVICE N/A 07/14/2015   Procedure: BiV ICD Insertion CRT-D;  Surgeon: Evans Lance, MD;  Location: [place] [facility];  Service: Cardiovascular;   Laterality: N/A;  . ESOPHAGOGASTRODUODENOSCOPY (EGD) WITH PROPOFOL N/A 04/21/2015   Procedure: ESOPHAGOGASTRODUODENOSCOPY (EGD) WITH PROPOFOL;  Surgeon: Ladene Artist, MD;  Location: [facility] ENDOS[facility];  Service: Endoscopy;  Laterality: N/A;  . FRACTURE SURGERY    . INSERTION OF ICD Left 07/14/2015  . LAPAROTOMY  1980   "adhesions"  . LAPAROTOMY  1989   laparotomy for takedown of intestinal obstruction secondary to adhesions   . OOPHORECTOMY  1961  . OVARY SURGERY Right 1985   resection of right ovarian cancer  . POLYPECTOMY  02/05/2018   Procedure: POLYPECTOMY;  Surgeon: Ladene Artist, MD;  Location: [facility] ENDOS[facility];  Service: Endoscopy;;  . POLYPECTOMY  2018   SSA x 2  . SMALL INTESTINE SURGERY  1985  . TEE WITHOUT CARDIOVERSION N/A 08/14/2019   Procedure: TRANSESOPHAGEAL ECHOCARDIOGRAM (TEE);  Surgeon: Constance Haw, MD;  Location: [place] [facility];  Service: Cardiovascular;  Laterality: N/A;  . TEE WITHOUT CARDIOVERSION N/A 07/15/2020   Procedure: TRANSESOPHAGEAL ECHOCARDIOGRAM (TEE);  Surgeon: Constance Haw, MD;  Location: [place] [facility];  Service: Cardiovascular;  Laterality: N/A;  . TONSILLECTOMY  1944  . TOTAL HIP ARTHROPLASTY Right 12/18/2018   Procedure: TOTAL HIP ARTHROPLASTY ANTERIOR APPROACH;  Surgeon: Rod Can, MD;  Location: [facility];  Service: Orthopedics;  Laterality: Right;     Medications Prior to Admission: Prior to Admission medications   Medication Sig Start Date End Date Taking? Authorizing Provider  acetaminophen (TYLENOL) 500 MG tablet Take 500 mg by mouth every 6 (six) hours as needed for moderate pain or headache.    [provider]  amiodarone (PACERONE) 200 MG tablet Take 1 tablet (200 mg total) by mouth daily. 06/23/20   Larey Dresser, MD  atorvastatin (LIPITOR) 40 MG tablet TAKE 1 TABLET(40 MG) BY MOUTH DAILY AT 6 PM Patient taking differently: Take 40 mg by mouth daily. 12/13/19   Camnitz, Ocie Doyne, MD  clobetasol (TEMOVATE)  0.05 % external solution Apply 1 application topically 2 (two) times daily as needed (irritation). 06/03/20   [provider]  Coenzyme Q10 (COQ10) 100 MG CAPS Take 100 mg by mouth daily.    [provider]  conjugated estrogens (PREMARIN) vaginal cream Place 1 Applicatorful vaginally daily as needed (dyspareunia (Use as directed)).     [provider]  dapagliflozin propanediol (FARXIGA) 10 MG TABS tablet Take 1 tablet (10 mg total) by mouth daily before breakfast. 03/17/20   Larey Dresser, MD  diclofenac Sodium (VOLTAREN) 1 % GEL Apply 2 g topically 2 (two) times daily as needed (pain).    [provider]  ELIQUIS 2.5 MG TABS tablet TAKE 1 TABLET(2.5 MG) BY MOUTH TWICE DAILY Patient taking differently: Take 2.5 mg by mouth 2 (two) times daily. 02/17/20   Camnitz, Ocie Doyne, MD  furosemide (LASIX) 40 MG tablet Take 1 tablet (40 mg total) by mouth 2 (two) times daily. 07/14/20   Larey Dresser, MD  ipratropium (ATROVENT) 0.03 % nasal spray Place 1 spray into both nostrils 2 (two)  times daily. 04/26/20   [provider]  ketoconazole (NIZORAL) 2 % shampoo Apply 1 application topically daily as needed (Psoriasis). 12/13/19   [provider]  levothyroxine (SYNTHROID) 25 MCG tablet TAKE 1 TABLET(25 MCG) BY MOUTH DAILY BEFORE BREAKFAST Patient taking differently: Take 25 mcg by mouth daily before breakfast. 03/23/20   Larey Dresser, MD  Magnesium Oxide 500 MG CAPS Take 1,000 mg by mouth 2 (two) times daily.     [provider]  metoprolol succinate (TOPROL-XL) 25 MG 24 hr tablet Take 1 tablet (25 mg total) by mouth at bedtime. 06/17/19   Larey Dresser, MD  potassium chloride SA (KLOR-CON) 20 MEQ tablet Take 1 tablet (20 mEq total) by mouth daily. 03/17/20   Larey Dresser, MD  sertraline (ZOLOFT) 25 MG tablet TAKE 1 TABLET(25 MG) BY MOUTH DAILY Patient taking differently: Take 25 mg by mouth daily. 07/14/20   Larey Dresser, MD   spironolactone (ALDACTONE) 25 MG tablet TAKE 1 TABLET(25 MG) BY MOUTH AT BEDTIME Patient taking differently: Take 25 mg by mouth at bedtime. 07/14/20   Larey Dresser, MD  zolpidem (AMBIEN) 5 MG tablet Take 5 mg by mouth at bedtime as needed for sleep.    [provider]     Allergies:    Allergies  Allergen Reactions  . Clindamycin/Lincomycin Itching  . Dramamine Ii [Meclizine Hcl] Other (See Comments)    hyper  . Pneumococcal Vaccines Itching and Swelling    Redness   . Sulfonamide Derivatives Itching and Swelling    Social History:   Social History   Socioeconomic History  . Marital status: Married    Spouse name: Not on file  . Number of children: 2  . Years of education: Not on file  . Highest education level: Not on file  Occupational History  . Occupation: retired  Tobacco Use  . Smoking status: Former Smoker    Packs/day: 0.50    Years: 20.00    Pack years: 10.00    Types: Cigarettes    Quit date: 04/18/1978    Years since quitting: 42.2  . Smokeless tobacco: Never Used  Vaping Use  . Vaping Use: Never used  Substance and Sexual Activity  . Alcohol use: Yes    Alcohol/week: 14.0 standard drinks    Types: 14 Standard drinks or equivalent per week    Comment: has wine before dinner   . Drug use: No  . Sexual activity: Not Currently  Other Topics Concern  . Not on file  Social History Narrative   Patient had never smoked.   Alcohol use- yes   Daily caffeine use- coffee   Illicit drug use- no   Occupation:retired    Social Determinants of Radio broadcast assistant Strain: Not on file  Food Insecurity: Not on file  Transportation Needs: Not on file  Physical Activity: Not on file  Stress: Not on file  Social Connections: Not on file  Intimate Partner Violence: Not on file    Family History:  *** The patient's family history includes Heart attack in her father; Heart disease in her father; Prostate cancer in her father; Uterine cancer in  her mother. There is no history of Colon cancer, Colon polyps, Esophageal cancer, Rectal cancer, or Stomach cancer.    ROS:  Please see the history of present illness.  ***All other ROS reviewed and negative.     Physical Exam/Data:   Vitals:   07/18/20 2215 07/18/20 2230 07/18/20  2245 07/18/20 2300  BP: 94/66 92/66 94/71  96/70  Pulse: 94 93 93 93  Resp: 20 14 15 19   Temp:      TempSrc:      SpO2: 97% 96% 97% 96%  Weight:      Height:       No intake or output data in the 24 hours ending 07/18/20 2359 Last 3 Weights 07/18/2020 07/15/2020 07/08/2020  Weight (lbs) 126 lb 126 lb 122 lb  Weight (kg) 57.153 kg 57.153 kg 55.339 kg     Body mass index is 21.63 kg/m.  General:  Well nourished, well developed, in no acute distress*** HEENT: normal Lymph: no adenopathy Neck: no*** JVD Endocrine:  No thryomegaly Vascular: No carotid bruits; FA pulses 2+ bilaterally without bruits  Cardiac:  normal S1, S2; RRR; no murmur *** Lungs:  clear to auscultation bilaterally, no wheezing, rhonchi or rales  Abd: soft, nontender, no hepatomegaly  Ext: no*** edema Musculoskeletal:  No deformities, BUE and BLE strength normal and equal Skin: warm and dry  Neuro:  CNs 2-12 intact, no focal abnormalities noted Psych:  Normal affect    EKG:  The ECG that was done *** was personally reviewed and demonstrates ***  Relevant CV Studies: ***  Laboratory Data:  High Sensitivity Troponin:   Recent Labs  Lab 07/18/20 2026  TROPONINIHS 24*      Chemistry Recent Labs  Lab 07/18/20 2026  NA 123*  K 3.7  CL 90*  CO2 24  GLUCOSE 111*  BUN 25*  CREATININE 1.46*  CALCIUM 7.9*  GFRNONAA 36*  ANIONGAP 9    Recent Labs  Lab 07/18/20 2026  PROT 6.2*  ALBUMIN 2.8*  AST 123*  ALT 76*  ALKPHOS 102  BILITOT 1.4*   Hematology Recent Labs  Lab 07/18/20 2026  WBC 6.0  RBC 3.97  HGB 10.9*  HCT 34.0*  MCV 85.6  MCH 27.5  MCHC 32.1  RDW 18.4*  PLT 147*   BNP Recent Labs  Lab  07/18/20 2026  BNP 2,412.6*    DDimer No results for input(s): DDIMER in the last 168 hours.   Radiology/Studies:  DG Chest Portable 1 View  Result Date: 07/18/2020 CLINICAL DATA:  85 year old female with shortness of breath. EXAM: PORTABLE CHEST 1 VIEW COMPARISON:  Chest radiograph dated 05/09/2019. FINDINGS: Left lung base linear atelectasis/scarring. No focal consolidation or pneumothorax. Trace left pleural effusion is suspected. There is cardiomegaly. Atherosclerotic calcification of the aorta. Left pectoral AICD device. No acute osseous pathology. IMPRESSION: 1. Trace left pleural effusion and left lung base atelectasis. 2. Cardiomegaly. Electronically Signed   By: Anner Crete M.D.   On: 07/18/2020 20:38   { If the patient is being seen for chest pain, [place], NSTEMI or STEMI Press F2 to calculate a risk score         :[id number]}   {Chest Pain/ACS Risk Score        :[id number]}   Assessment and Plan:  Cynthia Frank is a 85 y.o. female   #)   #)   #)  Severity of Illness: {Observation/Inpatient:21159}   For questions or updates, please contact [place] Please consult [email or web address] for contact info under        Signed, Marcie Mowers, MD  07/18/2020 11:59 PM

## 2020-07-18 NOTE — ED Triage Notes (Signed)
Pt arrived to ED via EMS from home w/ c/o generalized weakness since Wednesday after she had a cardiac ablation for A-flutter. Pt had an ablation 2 years ago for A-fib and reports she didn't feel like this afterwards. Pt reports she feels "off". C/o fatigue, dizziness when changing positions and inability to walk unassisted d/t weakness. Pt has a pacemaker and is V-paced. Pt denies any pain at this time. VSS w/ EMS. Pt A&Ox4

## 2020-07-18 NOTE — H&P (Addendum)
Cardiology Admission History and Physical:   Patient ID: Cynthia Frank MRN: [id number]; DOB: Oct 12, 1935   Admission date: 07/18/2020  Primary Care Provider: [name], MD Primary Cardiologist: No primary care provider on file.  Primary Electrophysiologist:  Will Meredith Leeds, MD  Chief Complaint:  "feeling off"  Patient Profile:   Cynthia Frank is a 85 y.o. female with niCM, HFrEF (25%), DM, HTN, HLD, hypothyroid, and AF s/p recent ablation (07/15/20) who presents with "feeling off."  History of Present Illness:   Ms. Magee underwent AF ablation with Dr Curt Bears on 07/15/20. She states she has not been feeling well since that time. Describes just feeling generally unwell. Hasn't had much energy. Has had exertional shortness of breath that she believes might be slightly worse than her baseline SOB. Denies any LE edema or change in weight. No fevers, chills, chest pain, nausea, vomiting, diarrhea. Has been taking all of her medications as prescribed, including her diuretic. Eating and drinking normally. No syncope, presyncope.   Patient brought to ED for evaluation. In the ED, she was found to be in a regular, V paced rhythm. Blood pressures with systolics in the 15'V, which she states is her baseline. No other vital sign abnormalities. Her labs were significant for Na 123, K 3.7, Cr 1.46 (near baseline), elevated AST/ALT (123/76), and elevated BNP to 2412. Troponin minimally elevated at 24. A CXR showed trace L pleural effusion and L lung base atelectasis. She was given 40mg  IV lasix and cardiology was consulted for admission.   Heart Pathway Score:     Past Medical History:  Diagnosis Date  . AICD (automatic cardioverter/defibrillator) present   . Arthritis    "left ankle" (07/14/2015)  . Atrial fibrillation ([place])   . Cataract    bilateral -not a surgical candidate  . CHF (congestive heart failure) ([place])   . Colon cancer ([place]) 03/2006   T3, N0  . Colon polyps  12/07/2010  . Coronary artery disease    nonobstructive with 30% D1  . Diabetes mellitus without complication ([place])    on meds  . GERD (gastroesophageal reflux disease)    OTC PRN -diet controlled  . History of ovarian cancer 1985  . Hx: UTI (urinary tract infection)   . Hyperlipidemia    on meds  . Hypertension    on meds  . Internal hemorrhoid   . Nonischemic cardiomyopathy ([place])    last EF assessment 40% by MUGA  . Osteoporosis    Prolia once per year  . Partial bowel obstruction ([place])   . PVC (premature ventricular contraction)   . Thyroid disease    on meds    Past Surgical History:  Procedure Laterality Date  . ABDOMINAL HYSTERECTOMY  1979  . ANKLE CLOSED REDUCTION  10/16/2011   Procedure: CLOSED REDUCTION ANKLE;  Surgeon: Tobi Bastos, MD;  Location: [facility];  Service: Orthopedics;  Laterality: Left;  . ATRIAL FIBRILLATION ABLATION N/A 08/14/2019   Procedure: ATRIAL FIBRILLATION ABLATION;  Surgeon: Constance Haw, MD;  Location: [facility];  Service: Cardiovascular;  Laterality: N/A;  . ATRIAL FIBRILLATION ABLATION N/A 07/15/2020   Procedure: ATRIAL FIBRILLATION ABLATION;  Surgeon: Constance Haw, MD;  Location: [facility];  Service: Cardiovascular;  Laterality: N/A;  . CARDIOVERSION N/A 04/20/2020   Procedure: CARDIOVERSION;  Surgeon: Larey Dresser, MD;  Location: [facility];  Service: Cardiovascular;  Laterality: N/A;  . COLECTOMY  2007   for colon cancer  . COLONOSCOPY N/A  03/11/2013   Procedure: COLONOSCOPY;  Surgeon: Ladene Artist, MD;  Location: [facility];  Service: Endoscopy;  Laterality: N/A;  . COLONOSCOPY  2019   MS-plenvu(good)-int hems-SSA x 2  . COLONOSCOPY WITH PROPOFOL N/A 04/21/2015   Procedure: COLONOSCOPY WITH PROPOFOL;  Surgeon: Ladene Artist, MD;  Location: [facility];  Service: Endoscopy;  Laterality: N/A;  . COLONOSCOPY WITH PROPOFOL N/A 02/05/2018   Procedure: COLONOSCOPY WITH PROPOFOL Hemostasis Clip;   Surgeon: Ladene Artist, MD;  Location: [facility];  Service: Endoscopy;  Laterality: N/A;  . EP IMPLANTABLE DEVICE N/A 07/14/2015   Procedure: BiV ICD Insertion CRT-D;  Surgeon: Evans Lance, MD;  Location: [facility];  Service: Cardiovascular;  Laterality: N/A;  . ESOPHAGOGASTRODUODENOSCOPY (EGD) WITH PROPOFOL N/A 04/21/2015   Procedure: ESOPHAGOGASTRODUODENOSCOPY (EGD) WITH PROPOFOL;  Surgeon: Ladene Artist, MD;  Location: [facility];  Service: Endoscopy;  Laterality: N/A;  . FRACTURE SURGERY    . INSERTION OF ICD Left 07/14/2015  . LAPAROTOMY  1980   "adhesions"  . LAPAROTOMY  1989   laparotomy for takedown of intestinal obstruction secondary to adhesions   . OOPHORECTOMY  1961  . OVARY SURGERY Right 1985   resection of right ovarian cancer  . POLYPECTOMY  02/05/2018   Procedure: POLYPECTOMY;  Surgeon: Ladene Artist, MD;  Location: [facility];  Service: Endoscopy;;  . POLYPECTOMY  2018   SSA x 2  . SMALL INTESTINE SURGERY  1985  . TEE WITHOUT CARDIOVERSION N/A 08/14/2019   Procedure: TRANSESOPHAGEAL ECHOCARDIOGRAM (TEE);  Surgeon: Constance Haw, MD;  Location: [place] [facility];  Service: Cardiovascular;  Laterality: N/A;  . TEE WITHOUT CARDIOVERSION N/A 07/15/2020   Procedure: TRANSESOPHAGEAL ECHOCARDIOGRAM (TEE);  Surgeon: Constance Haw, MD;  Location: [facility];  Service: Cardiovascular;  Laterality: N/A;  . TONSILLECTOMY  1944  . TOTAL HIP ARTHROPLASTY Right 12/18/2018   Procedure: TOTAL HIP ARTHROPLASTY ANTERIOR APPROACH;  Surgeon: Rod Can, MD;  Location: [facility];  Service: Orthopedics;  Laterality: Right;     Medications Prior to Admission: Prior to Admission medications   Medication Sig Start Date End Date Taking? Authorizing Provider  acetaminophen (TYLENOL) 500 MG tablet Take 500 mg by mouth every 6 (six) hours as needed for moderate pain or headache.    [provider]  amiodarone (PACERONE) 200 MG tablet Take 1  tablet (200 mg total) by mouth daily. 06/23/20   Larey Dresser, MD  atorvastatin (LIPITOR) 40 MG tablet TAKE 1 TABLET(40 MG) BY MOUTH DAILY AT 6 PM Patient taking differently: Take 40 mg by mouth daily. 12/13/19   Camnitz, Ocie Doyne, MD  clobetasol (TEMOVATE) 0.05 % external solution Apply 1 application topically 2 (two) times daily as needed (irritation). 06/03/20   [provider]  Coenzyme Q10 (COQ10) 100 MG CAPS Take 100 mg by mouth daily.    [provider]  conjugated estrogens (PREMARIN) vaginal cream Place 1 Applicatorful vaginally daily as needed (dyspareunia (Use as directed)).     [provider]  dapagliflozin propanediol (FARXIGA) 10 MG TABS tablet Take 1 tablet (10 mg total) by mouth daily before breakfast. 03/17/20   Larey Dresser, MD  diclofenac Sodium (VOLTAREN) 1 % GEL Apply 2 g topically 2 (two) times daily as needed (pain).    [provider]  ELIQUIS 2.5 MG TABS tablet TAKE 1 TABLET(2.5 MG) BY MOUTH TWICE DAILY Patient taking differently: Take 2.5 mg by mouth 2 (two) times daily. 02/17/20  Camnitz, Will Hassell Done, MD  furosemide (LASIX) 40 MG tablet Take 1 tablet (40 mg total) by mouth 2 (two) times daily. 07/14/20   Larey Dresser, MD  ipratropium (ATROVENT) 0.03 % nasal spray Place 1 spray into both nostrils 2 (two) times daily. 04/26/20   [provider]  ketoconazole (NIZORAL) 2 % shampoo Apply 1 application topically daily as needed (Psoriasis). 12/13/19   [provider]  levothyroxine (SYNTHROID) 25 MCG tablet TAKE 1 TABLET(25 MCG) BY MOUTH DAILY BEFORE BREAKFAST Patient taking differently: Take 25 mcg by mouth daily before breakfast. 03/23/20   Larey Dresser, MD  Magnesium Oxide 500 MG CAPS Take 1,000 mg by mouth 2 (two) times daily.     [provider]  metoprolol succinate (TOPROL-XL) 25 MG 24 hr tablet Take 1 tablet (25 mg total) by mouth at bedtime. 06/17/19   Larey Dresser, MD  potassium chloride SA  (KLOR-CON) 20 MEQ tablet Take 1 tablet (20 mEq total) by mouth daily. 03/17/20   Larey Dresser, MD  sertraline (ZOLOFT) 25 MG tablet TAKE 1 TABLET(25 MG) BY MOUTH DAILY Patient taking differently: Take 25 mg by mouth daily. 07/14/20   Larey Dresser, MD  spironolactone (ALDACTONE) 25 MG tablet TAKE 1 TABLET(25 MG) BY MOUTH AT BEDTIME Patient taking differently: Take 25 mg by mouth at bedtime. 07/14/20   Larey Dresser, MD  zolpidem (AMBIEN) 5 MG tablet Take 5 mg by mouth at bedtime as needed for sleep.    [provider]     Allergies:    Allergies  Allergen Reactions  . Clindamycin/Lincomycin Itching  . Dramamine Ii [Meclizine Hcl] Other (See Comments)    hyper  . Pneumococcal Vaccines Itching and Swelling    Redness   . Sulfonamide Derivatives Itching and Swelling    Social History:   Social History   Socioeconomic History  . Marital status: Married    Spouse name: Not on file  . Number of children: 2  . Years of education: Not on file  . Highest education level: Not on file  Occupational History  . Occupation: retired  Tobacco Use  . Smoking status: Former Smoker    Packs/day: 0.50    Years: 20.00    Pack years: 10.00    Types: Cigarettes    Quit date: 04/18/1978    Years since quitting: 42.2  . Smokeless tobacco: Never Used  Vaping Use  . Vaping Use: Never used  Substance and Sexual Activity  . Alcohol use: Yes    Alcohol/week: 14.0 standard drinks    Types: 14 Standard drinks or equivalent per week    Comment: has wine before dinner   . Drug use: No  . Sexual activity: Not Currently  Other Topics Concern  . Not on file  Social History Narrative   Patient had never smoked.   Alcohol use- yes   Daily caffeine use- coffee   Illicit drug use- no   Occupation:retired    Social Determinants of Radio broadcast assistant Strain: Not on file  Food Insecurity: Not on file  Transportation Needs: Not on file  Physical Activity: Not on file   Stress: Not on file  Social Connections: Not on file  Intimate Partner Violence: Not on file    Family History:   The patient's family history includes Heart attack in her father; Heart disease in her father; Prostate cancer in her father; Uterine cancer in her mother. There is no history of Colon cancer,  Colon polyps, Esophageal cancer, Rectal cancer, or Stomach cancer.    ROS:  Please see the history of present illness.  All other ROS reviewed and negative.     Physical Exam/Data:   Vitals:   07/18/20 2215 07/18/20 2230 07/18/20 2245 07/18/20 2300  BP: 94/66 92/66 94/71  96/70  Pulse: 94 93 93 93  Resp: 20 14 15 19   Temp:      TempSrc:      SpO2: 97% 96% 97% 96%  Weight:      Height:       No intake or output data in the 24 hours ending 07/18/20 2359 Last 3 Weights 07/18/2020 07/15/2020 07/08/2020  Weight (lbs) 126 lb 126 lb 122 lb  Weight (kg) 57.153 kg 57.153 kg 55.339 kg     Body mass index is 21.63 kg/m.  General:  Elderly, frail, NAD Neck: JVP elevated to 8cm H2O Cardiac:  normal S1, S2; RRR; 2/6 holosystolic murmur across the precordium Lungs: prominent crackles in the bilateral lung bases Abd: soft, nontender, no hepatomegaly  Ext: no edema Skin: warm and dry  Neuro:  CNs 2-12 intact, no focal abnormalities noted Psych:  Normal affect   EKG:  The ECG that was done on arrival to the ED was personally reviewed and demonstrates NSR at HR 95 bpm, A sensed V paced rhythm  Relevant CV Studies: Echo 02/2020 1. Left ventricular ejection fraction, by estimation, is 25 to 30%. The  left ventricle has severely decreased function. The left ventricle  demonstrates global hypokinesis with septal-lateral dyssynchrony. The left  ventricular internal cavity size was  mildly dilated. Left ventricular diastolic parameters are indeterminate.  2. Right ventricular systolic function is moderately reduced. The right  ventricular size is normal. There is mildly elevated pulmonary  artery  systolic pressure. The estimated right ventricular systolic pressure is  35.0 mmHg.  3. Left atrial size was moderately dilated.  4. The mitral valve is normal in structure. Moderate mitral valve  regurgitation. No evidence of mitral stenosis.  5. Tricuspid valve regurgitation is mild to moderate.  6. The aortic valve is normal in structure. Aortic valve regurgitation is  trivial. No aortic stenosis is present.  7. The inferior vena cava is normal in size with greater than 50%  respiratory variability, suggesting right atrial pressure of 3 mmHg.   Laboratory Data:  High Sensitivity Troponin:   Recent Labs  Lab 07/18/20 2026  TROPONINIHS 24*      Chemistry Recent Labs  Lab 07/18/20 2026  NA 123*  K 3.7  CL 90*  CO2 24  GLUCOSE 111*  BUN 25*  CREATININE 1.46*  CALCIUM 7.9*  GFRNONAA 36*  ANIONGAP 9    Recent Labs  Lab 07/18/20 2026  PROT 6.2*  ALBUMIN 2.8*  AST 123*  ALT 76*  ALKPHOS 102  BILITOT 1.4*   Hematology Recent Labs  Lab 07/18/20 2026  WBC 6.0  RBC 3.97  HGB 10.9*  HCT 34.0*  MCV 85.6  MCH 27.5  MCHC 32.1  RDW 18.4*  PLT 147*   BNP Recent Labs  Lab 07/18/20 2026  BNP 2,412.6*    DDimer No results for input(s): DDIMER in the last 168 hours.   Radiology/Studies:  DG Chest Portable 1 View  Result Date: 07/18/2020 CLINICAL DATA:  85 year old female with shortness of breath. EXAM: PORTABLE CHEST 1 VIEW COMPARISON:  Chest radiograph dated 05/09/2019. FINDINGS: Left lung base linear atelectasis/scarring. No focal consolidation or pneumothorax. Trace left pleural effusion is suspected. There  is cardiomegaly. Atherosclerotic calcification of the aorta. Left pectoral AICD device. No acute osseous pathology. IMPRESSION: 1. Trace left pleural effusion and left lung base atelectasis. 2. Cardiomegaly. Electronically Signed   By: Anner Crete M.D.   On: 07/18/2020 20:38    Assessment and Plan:  Cynthia Frank is a 85 y.o. female  with niCM, HFrEF (25%), DM, HTN, HLD, hypothyroid, and AF s/p recent ablation (07/15/20) who presents with "feeling off." She has evidence of moderate volume overload on exam and multiple metabolic abnormalities that are further consistent with volume retention. Her sodium has been trending downward for over 1 week now so it is possible that she has slowly been accumulating volume and the AF ablation that she recently underwent could have pushed her into decompensated HF. We will admit and plan to treat with diuretics, monitoring her for improvement in her symptoms and in her metabolic disarray.   #) Hyponatremia, HFrEF, severe MR - check lactate (low BP but patient states this is her baseline) - IV lasix 40mg  x1 overnight, will give another dose in AM - cont home dapa, spiro - hold home metoprolol for now given borderline blood pressures - cont home K 23mEq daily - if hyponatremia does not improve with diuretics, will consult renal  #) AF s/p ablation - would have EP confirm that nothing has changed with her biventricular pacing - hold amiodarone given increasing LFTs - check TFTs - cont apixaban - holding metoprolol as per above  #) DM - cont dapa  Severity of Illness: The appropriate patient status for this patient is INPATIENT. Inpatient status is judged to be reasonable and necessary in order to provide the required intensity of service to ensure the patient's safety. The patient's presenting symptoms, physical exam findings, and initial radiographic and laboratory data in the context of their chronic comorbidities is felt to place them at high risk for further clinical deterioration. Furthermore, it is not anticipated that the patient will be medically stable for discharge from the hospital within 2 midnights of admission. The following factors support the patient status of inpatient.   " The patient's presenting symptoms include fatigue. " The worrisome physical exam findings include  volume overload. " The initial radiographic and laboratory data are worrisome because of hyponatremia. " The chronic co-morbidities include age, HFrEF.  * I certify that at the point of admission it is my clinical judgment that the patient will require inpatient hospital care spanning beyond 2 midnights from the point of admission due to high intensity of service, high risk for further deterioration and high frequency of surveillance required.*   For questions or updates, please contact [place] Please consult [email or web address] for contact info under   Signed, Marcie Mowers, MD  07/18/2020 11:59 PM

## 2020-07-18 NOTE — H&P (Incomplete)
Cynthia Frank WUJ:[id number] DOB: 01-22-1936 DOA: 07/18/2020     PCP: [name], MD   Outpatient Specialists: * NONE CARDS:  Dr. Burt Knack, Dr Curt Bears    GI Dr. Fuller Plan  (  LB)    Patient arrived to ER on 07/18/20 at 1912 Referred by Attending Carmin Muskrat, MD   Patient coming from: home Lives alone,   *** With family    Chief Complaint:  Chief Complaint  Patient presents with  . Weakness    HPI: Cynthia Frank is a 85 y.o. female with medical history significant of  chronic systolic heart failure due to nonischemic cardiomyopathy, paroxysmal atrial fibrillation, ovale and and colon cancers., CAD, GERD, HLD, Hyponatremia (130)    Presented with  Fatigue, feeling off ever since her procedure on 30 March.  No chest pains or short of breath worse when she tries to move around.  No leg edema She has had some trouble balance always in been no worse now. States she has been taking her Lasix as prescribed She has had a cardiomyopathy for approximately 15 years.  It was thought this was due to Adriamycin.  She is status post Medtronic CRT-D implanted 07/06/2015.  She was having an increased atrial fibrillation burden and is now status post ablation 08/14/2019. Had ablation 07/15/2020  Reports dyspneic on exertion  Infectious risk factors:  Reports shortness of breath,   Has    been vaccinated against COVID **and boosted   Initial COVID TEST  NEGATIVE**** POSITIVE,  ***in house  PCR testing  Pending  Lab Results  Component Value Date   SARSCOV2NAA NEGATIVE 07/13/2020   SARSCOV2NAA POSITIVE (A) 06/18/2020   [place] NEGATIVE 04/16/2020   [place] NEGATIVE 08/12/2019    Regarding pertinent Chronic problems:     Hyperlipidemia - on statins Lipitor Lipid Panel     Component Value Date/Time   CHOL 150 12/12/2019 1123   TRIG 60 12/12/2019 1123   HDL 86 12/12/2019 1123   CHOLHDL 1.7 12/12/2019 1123   VLDL 12 12/12/2019 1123   LDLCALC 52 12/12/2019  1123     HTN on toprol, spironolactone   chronic CHF diastolic/systolic/ combined - last echo 07/15/2020 EF Severe Mitral Regurgitation Left ventricular ejection fraction, by estimation, is 20 to 25%. Moderate and eccentric tricuspid regurgitation  On Farxiga, Lasix, spironolactone    CAD  - On Aspirin, statin, betablocker,                   -  ollowed by cardiology               Hypothyroidism:  Lab Results  Component Value Date   TSH 4.994 (H) 07/03/2020   on synthroid    A. Fib -  - CHA2DS2 vas score    6      current  on anticoagulation with   Eliquis,           -  Rate control:  Currently controlled with  Toprol   *Metoprolol,* Diltiazem, *Coreg          - Rhythm control:  amiodarone,       CKD stage IIIb- baseline Cr 1.4 Estimated Creatinine Clearance: 25.7 mL/min (A) (by C-G formula based on SCr of 1.46 mg/dL (H)).  Lab Results  Component Value Date   CREATININE 1.46 (H) 07/18/2020   CREATININE 1.37 (H) 07/08/2020   CREATININE 1.42 (H) 07/03/2020       Chronic anemia - baseline hg Hemoglobin & Hematocrit  Recent Labs    04/14/20 1137 06/18/20 1119 07/18/20 2026  HGB 12.2 12.2 10.9*    While in ER: Noted to be hypotensive, hyponatremic     ED Triage Vitals  Enc Vitals Group     BP 07/18/20 1923 98/72     Pulse Rate 07/18/20 1923 94     Resp 07/18/20 1923 18     Temp 07/18/20 1923 97.6 F (36.4 C)     Temp Source 07/18/20 1923 Oral     SpO2 07/18/20 1913 96 %     Weight 07/18/20 1924 126 lb (57.2 kg)     Height 07/18/20 1924 5\' 4"  (1.626 m)     Head Circumference --      Peak Flow --      Pain Score 07/18/20 1924 0     Pain Loc --      Pain Edu? --      Excl. in [place]? --   TMAX(24)@     _________________________________________ Significant initial  Findings: Abnormal Labs Reviewed  COMPREHENSIVE METABOLIC PANEL - Abnormal; Notable for the following components:      Result Value   Sodium 123 (*)    Chloride 90 (*)    Glucose, Bld 111 (*)     BUN 25 (*)    Creatinine, Ser 1.46 (*)    Calcium 7.9 (*)    Total Protein 6.2 (*)    Albumin 2.8 (*)    AST 123 (*)    ALT 76 (*)    Total Bilirubin 1.4 (*)    GFR, Estimated 36 (*)    All other components within normal limits  CBC WITH DIFFERENTIAL/PLATELET - Abnormal; Notable for the following components:   Hemoglobin 10.9 (*)    HCT 34.0 (*)    RDW 18.4 (*)    Platelets 147 (*)    Lymphs Abs 0.6 (*)    All other components within normal limits  BRAIN NATRIURETIC PEPTIDE - Abnormal; Notable for the following components:   B Natriuretic Peptide 2,412.6 (*)    All other components within normal limits  TROPONIN I (HIGH SENSITIVITY) - Abnormal; Notable for the following components:   Troponin I (High Sensitivity) 24 (*)    All other components within normal limits   ____________________________________________ Ordered CT HEAD *** NON acute  CXR - ***NON acute  CTabd/pelvis - ***nonacute  CTA chest - ***nonacute, no PE, * no evidence of infiltrate   _________________________ Troponin ***ordered ECG: Ordered Personally reviewed by me showing: HR : *** Rhythm: *NSR, Sinus tachycardia * A.fib. W RVR, RBBB, LBBB, Paced Ischemic changes*nonspecific changes, no evidence of ischemic changes QTC* ____________________ This patient meets SIRS Criteria and may be septic. SIRS = Systemic Inflammatory Response Syndrome  Order a lactic acid level if needed AND/OR Initiate the sepsis protocol with the attached order set OR Click "Treating Associated Infection or Illness" if the patient is being treated for an infection that is a known cause of these abnormalities     The recent clinical data is shown below. Vitals:   07/18/20 2130 07/18/20 2145 07/18/20 2200 07/18/20 2215  BP: 92/64 99/75 94/69  94/66  Pulse: 92 95 94 94  Resp: 13 (!) 23 17 20   Temp:      TempSrc:      SpO2: 93% 99% 97% 97%  Weight:      Height:            WBC     Component Value Date/Time  WBC 6.0 07/18/2020 2026   LYMPHSABS 0.6 (L) 07/18/2020 2026   LYMPHSABS 3.2 08/17/2011 0807   MONOABS 0.4 07/18/2020 2026   MONOABS 0.8 08/17/2011 0807   EOSABS 0.0 07/18/2020 2026   EOSABS 0.2 08/17/2011 0807   BASOSABS 0.0 07/18/2020 2026   BASOSABS 0.1 08/17/2011 0807        Lactic Acid, Venous No results found for: LATICACIDVEN   Procalcitonin *** Ordered Lactic Acid, Venous No results found for: LATICACIDVEN   Procalcitonin *** Ordered   UA *** no evidence of UTI  ***Pending ***not ordered   Urine analysis:    Component Value Date/Time   COLORURINE YELLOW 12/16/2018 0911   APPEARANCEUR HAZY (A) 12/16/2018 0911   LABSPEC 1.016 12/16/2018 0911   PHURINE 5.0 12/16/2018 0911   GLUCOSEU NEGATIVE 12/16/2018 0911   GLUCOSEU NEGATIVE 12/05/2011 0835   HGBUR SMALL (A) 12/16/2018 0911   BILIRUBINUR NEGATIVE 12/16/2018 0911   BILIRUBINUR negative 02/15/2012 1125   KETONESUR 5 (A) 12/16/2018 0911   PROTEINUR NEGATIVE 12/16/2018 0911   UROBILINOGEN 0.2 02/15/2012 1125   UROBILINOGEN 0.2 12/05/2011 0835   NITRITE NEGATIVE 12/16/2018 0911   LEUKOCYTESUR NEGATIVE 12/16/2018 0911    Results for orders placed or performed during the hospital encounter of 07/13/20  SARS CORONAVIRUS 2 (TAT 6-24 HRS) Nasopharyngeal Nasopharyngeal Swab     Status: None   Collection Time: 07/13/20 10:24 AM   Specimen: Nasopharyngeal Swab  Result Value Ref Range Status   SARS Coronavirus 2 NEGATIVE NEGATIVE Final    Comment: (NOTE) SARS-CoV-2 target nucleic acids are NOT DETECTED.  The SARS-CoV-2 RNA is generally detectable in upper and lower respiratory specimens during the acute phase of infection. Negative results do not preclude SARS-CoV-2 infection, do not rule out co-infections with other pathogens, and should not be used as the sole basis for treatment or other patient management decisions. Negative results must be combined with clinical observations, patient history, and  epidemiological information. The expected result is Negative.  Fact Sheet for Patients: SugarRoll.be  Fact Sheet for Healthcare Providers: https://www.woods-mathews.com/  This test is not yet approved or cleared by the [place] FDA and  has been authorized for detection and/or diagnosis of SARS-CoV-2 by FDA under an Emergency Use Authorization (EUA). This EUA will remain  in effect (meaning this test can be used) for the duration of the COVID-19 declaration under Se ction 564(b)(1) of the Act, 21 U.S.C. section 360bbb-3(b)(1), unless the authorization is terminated or revoked sooner.  Performed at [facility], [place] [address], [place], [place] [address]      _______________________________________________________ ER Provider Called:     DrMarland Kitchen  They Recommend admit to medicine *** Will see in AM  ***SEEN in ER _______________________________________________ Hospitalist was called for admission for ***  The following Work up has been ordered so far:  Orders Placed This Encounter  Procedures  . Resp Panel by RT-PCR (Flu A&B, Covid) Nasopharyngeal Swab  . DG Chest Portable 1 View  . Comprehensive metabolic panel  . CBC with Differential  . Brain natriuretic peptide  . Magnesium  . Urinalysis, Routine w reflex microscopic  . Osmolality, urine  . Osmolality  . Sodium, urine, random  . Interrogate Pacemaker if present  . Consult to hospitalist  . EKG 12-Lead      Following Medications were ordered in ER: Medications  furosemide (LASIX) injection 40 mg (has no administration in time range)        Consult Orders  (From admission, onward)  Start     Ordered   07/18/20 2250  Consult to hospitalist  Paged by Kennyth Lose  Once       Provider:  (Not yet assigned)  Question Answer Comment  Place call to: [facility]   Reason for Consult Admit      07/18/20 2249            OTHER Significant initial   Findings:  labs showing:    Recent Labs  Lab 07/18/20 2026  NA 123*  K 3.7  CO2 24  GLUCOSE 111*  BUN 25*  CREATININE 1.46*  CALCIUM 7.9*  MG 2.2    Cr  * stable,  Up from baseline see below Lab Results  Component Value Date   CREATININE 1.46 (H) 07/18/2020   CREATININE 1.37 (H) 07/08/2020   CREATININE 1.42 (H) 07/03/2020    Recent Labs  Lab 07/18/20 2026  AST 123*  ALT 76*  ALKPHOS 102  BILITOT 1.4*  PROT 6.2*  ALBUMIN 2.8*   Lab Results  Component Value Date   CALCIUM 7.9 (L) 07/18/2020   PHOS 2.6 09/01/2008          Plt: Lab Results  Component Value Date   PLT 147 (L) 07/18/2020       COVID-19 Labs  No results for input(s): DDIMER, FERRITIN, LDH, CRP in the last 72 hours.  Lab Results  Component Value Date   SARSCOV2NAA NEGATIVE 07/13/2020   SARSCOV2NAA POSITIVE (A) 06/18/2020   [place] NEGATIVE 04/16/2020   [place] NEGATIVE 08/12/2019     Arterial ***Venous  Blood Gas result:  pH *** pCO2 ***; pO2 ***;     %O2 Sat ***.  ABG No results found for: PHART, PCO2ART, PO2ART, HCO3, TCO2, ACIDBASEDEF, O2SAT       Recent Labs  Lab 07/18/20 2026  WBC 6.0  NEUTROABS 4.8  HGB 10.9*  HCT 34.0*  MCV 85.6  PLT 147*    HG/HCT    Down  from baseline see below    Component Value Date/Time   HGB 10.9 (L) 07/18/2020 2026   HGB 12.2 06/18/2020 1119   HGB 14.2 08/17/2011 0807   HCT 34.0 (L) 07/18/2020 2026   HCT 36.6 06/18/2020 1119   HCT 42.8 08/17/2011 0807   MCV 85.6 07/18/2020 2026   MCV 85 06/18/2020 1119   MCV 93.9 08/17/2011 0807      BNP (last 3 results) Recent Labs    07/18/20 2026  BNP 2,412.6*       Cultures: No results found for: SDES, SPECREQUEST, CULT, REPTSTATUS   Radiological Exams on Admission: DG Chest Portable 1 View  Result Date: 07/18/2020 CLINICAL DATA:  85 year old female with shortness of breath. EXAM: PORTABLE CHEST 1 VIEW COMPARISON:  Chest radiograph dated 05/09/2019. FINDINGS: Left lung base  linear atelectasis/scarring. No focal consolidation or pneumothorax. Trace left pleural effusion is suspected. There is cardiomegaly. Atherosclerotic calcification of the aorta. Left pectoral AICD device. No acute osseous pathology. IMPRESSION: 1. Trace left pleural effusion and left lung base atelectasis. 2. Cardiomegaly. Electronically Signed   By: Anner Crete M.D.   On: 07/18/2020 20:38   _______________________________________________________________________________________________________ Latest  Blood pressure 94/66, pulse 94, temperature 97.6 F (36.4 C), temperature source Oral, resp. rate 20, height 5\' 4"  (1.626 m), weight 57.2 kg, SpO2 97 %.   Review of Systems:    Pertinent positives include:  Fatigue, dyspnea on exertion,   Constitutional:  No weight loss, night sweats, Fevers, chills,  weight loss  HEENT:  No  headaches, Difficulty swallowing,Tooth/dental problems,Sore throat,  No sneezing, itching, ear ache, nasal congestion, post nasal drip,  Cardio-vascular:  No chest pain, Orthopnea, PND, anasarca, dizziness, palpitations.no Bilateral lower extremity swelling  GI:  No heartburn, indigestion, abdominal pain, nausea, vomiting, diarrhea, change in bowel habits, loss of appetite, melena, blood in stool, hematemesis Resp:  no shortness of breath at rest. No No excess mucus, no productive cough, No non-productive cough, No coughing up of blood.No change in color of mucus.No wheezing. Skin:  no rash or lesions. No jaundice GU:  no dysuria, change in color of urine, no urgency or frequency. No straining to urinate.  No flank pain.  Musculoskeletal:  No joint pain or no joint swelling. No decreased range of motion. No back pain.  Psych:  No change in mood or affect. No depression or anxiety. No memory loss.  Neuro: no localizing neurological complaints, no tingling, no weakness, no double vision, no gait abnormality, no slurred speech, no confusion  All systems reviewed  and apart from [place] all are negative _______________________________________________________________________________________________ Past Medical History:   Past Medical History:  Diagnosis Date  . AICD (automatic cardioverter/defibrillator) present   . Arthritis    "left ankle" (07/14/2015)  . Atrial fibrillation ([place])   . Cataract    bilateral -not a surgical candidate  . CHF (congestive heart failure) ([place])   . Colon cancer ([place]) 03/2006   T3, N0  . Colon polyps 12/07/2010  . Coronary artery disease    nonobstructive with 30% D1  . Diabetes mellitus without complication ([place])    on meds  . GERD (gastroesophageal reflux disease)    OTC PRN -diet controlled  . History of ovarian cancer 1985  . Hx: UTI (urinary tract infection)   . Hyperlipidemia    on meds  . Hypertension    on meds  . Internal hemorrhoid   . Nonischemic cardiomyopathy ([place])    last EF assessment 40% by MUGA  . Osteoporosis    Prolia once per year  . Partial bowel obstruction ([place])   . PVC (premature ventricular contraction)   . Thyroid disease    on meds     Past Surgical History:  Procedure Laterality Date  . ABDOMINAL HYSTERECTOMY  1979  . ANKLE CLOSED REDUCTION  10/16/2011   Procedure: CLOSED REDUCTION ANKLE;  Surgeon: Tobi Bastos, MD;  Location: [facility];  Service: Orthopedics;  Laterality: Left;  . ATRIAL FIBRILLATION ABLATION N/A 08/14/2019   Procedure: ATRIAL FIBRILLATION ABLATION;  Surgeon: Constance Haw, MD;  Location: [place] [facility];  Service: Cardiovascular;  Laterality: N/A;  . ATRIAL FIBRILLATION ABLATION N/A 07/15/2020   Procedure: ATRIAL FIBRILLATION ABLATION;  Surgeon: Constance Haw, MD;  Location: [place] [facility];  Service: Cardiovascular;  Laterality: N/A;  . CARDIOVERSION N/A 04/20/2020   Procedure: CARDIOVERSION;  Surgeon: Larey Dresser, MD;  Location: [facility] ENDOSCOPY;  Service: Cardiovascular;  Laterality: N/A;  . COLECTOMY  2007   for colon cancer  .  COLONOSCOPY N/A 03/11/2013   Procedure: COLONOSCOPY;  Surgeon: Ladene Artist, MD;  Location: [facility] ENDOSCOPY;  Service: Endoscopy;  Laterality: N/A;  . COLONOSCOPY  2019   MS-plenvu(good)-int hems-SSA x 2  . COLONOSCOPY WITH PROPOFOL N/A 04/21/2015   Procedure: COLONOSCOPY WITH PROPOFOL;  Surgeon: Ladene Artist, MD;  Location: [facility] ENDOS[facility];  Service: Endoscopy;  Laterality: N/A;  . COLONOSCOPY WITH PROPOFOL N/A 02/05/2018   Procedure: COLONOSCOPY WITH PROPOFOL Hemostasis Clip;  Surgeon: Ladene Artist, MD;  Location: [facility]  ENDOS[facility];  Service: Endoscopy;  Laterality: N/A;  . EP IMPLANTABLE DEVICE N/A 07/14/2015   Procedure: BiV ICD Insertion CRT-D;  Surgeon: Evans Lance, MD;  Location: [place] [facility];  Service: Cardiovascular;  Laterality: N/A;  . ESOPHAGOGASTRODUODENOSCOPY (EGD) WITH PROPOFOL N/A 04/21/2015   Procedure: ESOPHAGOGASTRODUODENOSCOPY (EGD) WITH PROPOFOL;  Surgeon: Ladene Artist, MD;  Location: [facility];  Service: Endoscopy;  Laterality: N/A;  . FRACTURE SURGERY    . INSERTION OF ICD Left 07/14/2015  . LAPAROTOMY  1980   "adhesions"  . LAPAROTOMY  1989   laparotomy for takedown of intestinal obstruction secondary to adhesions   . OOPHORECTOMY  1961  . OVARY SURGERY Right 1985   resection of right ovarian cancer  . POLYPECTOMY  02/05/2018   Procedure: POLYPECTOMY;  Surgeon: Ladene Artist, MD;  Location: [facility];  Service: Endoscopy;;  . POLYPECTOMY  2018   SSA x 2  . SMALL INTESTINE SURGERY  1985  . TEE WITHOUT CARDIOVERSION N/A 08/14/2019   Procedure: TRANSESOPHAGEAL ECHOCARDIOGRAM (TEE);  Surgeon: Constance Haw, MD;  Location: [place] [facility];  Service: Cardiovascular;  Laterality: N/A;  . TEE WITHOUT CARDIOVERSION N/A 07/15/2020   Procedure: TRANSESOPHAGEAL ECHOCARDIOGRAM (TEE);  Surgeon: Constance Haw, MD;  Location: [place] [facility];  Service: Cardiovascular;  Laterality: N/A;  . TONSILLECTOMY  1944  . TOTAL HIP ARTHROPLASTY Right  12/18/2018   Procedure: TOTAL HIP ARTHROPLASTY ANTERIOR APPROACH;  Surgeon: Rod Can, MD;  Location: [facility];  Service: Orthopedics;  Laterality: Right;    Social History:  Ambulatory *** independently cane, walker  wheelchair bound, bed bound     reports that she quit smoking about 42 years ago. Her smoking use included cigarettes. She has a 10.00 pack-year smoking history. She has never used smokeless tobacco. She reports current alcohol use of about 14.0 standard drinks of alcohol per week. She reports that she does not use drugs.     Family History: *** Family History  Problem Relation Age of Onset  . Uterine cancer Mother   . Prostate cancer Father   . Heart disease Father   . Heart attack Father   . Colon cancer Neg Hx   . Colon polyps Neg Hx   . Esophageal cancer Neg Hx   . Rectal cancer Neg Hx   . Stomach cancer Neg Hx    ______________________________________________________________________________________________ Allergies: Allergies  Allergen Reactions  . Clindamycin/Lincomycin Itching  . Dramamine Ii [Meclizine Hcl] Other (See Comments)    hyper  . Pneumococcal Vaccines Itching and Swelling    Redness   . Sulfonamide Derivatives Itching and Swelling     Prior to Admission medications   Medication Sig Start Date End Date Taking? Authorizing Provider  acetaminophen (TYLENOL) 500 MG tablet Take 500 mg by mouth every 6 (six) hours as needed for moderate pain or headache.    [provider]  amiodarone (PACERONE) 200 MG tablet Take 1 tablet (200 mg total) by mouth daily. 06/23/20   Larey Dresser, MD  atorvastatin (LIPITOR) 40 MG tablet TAKE 1 TABLET(40 MG) BY MOUTH DAILY AT 6 PM Patient taking differently: Take 40 mg by mouth daily. 12/13/19   Camnitz, Ocie Doyne, MD  clobetasol (TEMOVATE) 0.05 % external solution Apply 1 application topically 2 (two) times daily as needed (irritation). 06/03/20   [provider]  Coenzyme Q10 (COQ10) 100  MG CAPS Take 100 mg by mouth daily.    [provider]  conjugated estrogens (PREMARIN) vaginal  cream Place 1 Applicatorful vaginally daily as needed (dyspareunia (Use as directed)).     [provider]  dapagliflozin propanediol (FARXIGA) 10 MG TABS tablet Take 1 tablet (10 mg total) by mouth daily before breakfast. 03/17/20   Larey Dresser, MD  diclofenac Sodium (VOLTAREN) 1 % GEL Apply 2 g topically 2 (two) times daily as needed (pain).    [provider]  ELIQUIS 2.5 MG TABS tablet TAKE 1 TABLET(2.5 MG) BY MOUTH TWICE DAILY Patient taking differently: Take 2.5 mg by mouth 2 (two) times daily. 02/17/20   Camnitz, Ocie Doyne, MD  furosemide (LASIX) 40 MG tablet Take 1 tablet (40 mg total) by mouth 2 (two) times daily. 07/14/20   Larey Dresser, MD  ipratropium (ATROVENT) 0.03 % nasal spray Place 1 spray into both nostrils 2 (two) times daily. 04/26/20   [provider]  ketoconazole (NIZORAL) 2 % shampoo Apply 1 application topically daily as needed (Psoriasis). 12/13/19   [provider]  levothyroxine (SYNTHROID) 25 MCG tablet TAKE 1 TABLET(25 MCG) BY MOUTH DAILY BEFORE BREAKFAST Patient taking differently: Take 25 mcg by mouth daily before breakfast. 03/23/20   Larey Dresser, MD  Magnesium Oxide 500 MG CAPS Take 1,000 mg by mouth 2 (two) times daily.     [provider]  metoprolol succinate (TOPROL-XL) 25 MG 24 hr tablet Take 1 tablet (25 mg total) by mouth at bedtime. 06/17/19   Larey Dresser, MD  potassium chloride SA (KLOR-CON) 20 MEQ tablet Take 1 tablet (20 mEq total) by mouth daily. 03/17/20   Larey Dresser, MD  sertraline (ZOLOFT) 25 MG tablet TAKE 1 TABLET(25 MG) BY MOUTH DAILY Patient taking differently: Take 25 mg by mouth daily. 07/14/20   Larey Dresser, MD  spironolactone (ALDACTONE) 25 MG tablet TAKE 1 TABLET(25 MG) BY MOUTH AT BEDTIME Patient taking differently: Take 25 mg by mouth at bedtime. 07/14/20   Larey Dresser, MD  zolpidem (AMBIEN) 5 MG tablet Take 5 mg by mouth at bedtime as needed for sleep.    [provider]    ___________________________________________________________________________________________________ Physical Exam: Vitals with BMI 07/18/2020 07/18/2020 07/18/2020  Height - - -  Weight - - -  BMI - - -  Systolic 94 94 99  Diastolic 66 69 75  Pulse 94 94 95    1. General:  in No Acute distress  * Chronically ill *well *cachectic *toxic acutely ill -appearing 2. Psychological: Alert and *** Oriented 3. Head/ENT:   Moist *** Dry Mucous Membranes                          Head Non traumatic, neck supple                          Normal *** Poor Dentition 4. SKIN: normal *** decreased Skin turgor,  Skin clean Dry and intact no rash 5. Heart: Regular rate and rhythm no*** Murmur, no Rub or gallop 6. Lungs: ***Clear to auscultation bilaterally, no wheezes or crackles   7. Abdomen: Soft, ***non-tender, Non distended *** obese ***bowel sounds present 8. Lower extremities: no clubbing, cyanosis, no ***edema 9. Neurologically Grossly intact, moving all 4 extremities equally *** strength 5 out of 5 in all 4 extremities cranial nerves II through XII intact 10. MSK: Normal range of motion    Chart has been reviewed  ______________________________________________________________________________________________  Assessment/Plan   85 y.o. female  with medical history significant of  chronic systolic heart failure due to nonischemic cardiomyopathy, paroxysmal atrial fibrillation, ovale and and colon cancers., CAD, GERD, HLD, Hyponatremia (130) Admitted for hyponatremia  Present on Admission: . Heart failure with reduced ejection fraction ([place]) . Severe mitral regurgitation . Hyperlipidemia . GERD (gastroesophageal reflux disease) . Essential hypertension . Coronary artery disease . Chronic systolic CHF (congestive heart failure) ([place]) . Atrial fibrillation ([place]) .  Hyponatremia . Anemia . Elevated LFTs   Other plan as per orders.  DVT prophylaxis:  On Eliquis   Code Status:    Code Status: Prior FULL CODE *** DNR/DNI ***comfort care as per patient ***family  I had personally discussed CODE STATUS with patient and family* I had spent *min discussing goals of care and CODE STATUS    Family Communication:   Family not at  Bedside  plan of care was discussed on the phone with *** Son, Daughter, Wife, Husband, Sister, Brother , father, mother  Disposition Plan:      To home once workup is complete and patient is stable   Following barriers for discharge:                            Electrolytes corrected                               Anemia corrected                             Pain controlled with PO medications                               Afebrile, white count improving able to transition to PO antibiotics                             Will need to be able to tolerate PO                            Will likely need home health, home O2, set up                           Will need consultants to evaluate patient prior to discharge  ****EXPECT DC tomorrow                    ***Would benefit from PT/OT eval prior to DC  Ordered                   Swallow eval - SLP ordered                   Diabetes care coordinator                   Transition of care consulted                   Nutrition    consulted                  Wound care  consulted                   Palliative care    consulted  Behavioral health  consulted                    Consults called: ***  Cardiology is aware  Admission status:  ED Disposition    None       Obs***  ***  inpatient     I Expect 2 midnight stay secondary to severity of patient's current illness need for inpatient interventions justified by the following: ***hemodynamic instability despite optimal treatment (tachycardia *hypotension * tachypnea *hypoxia, hypercapnia) * Severe  lab/radiological/exam abnormalities including:     and extensive comorbidities including: *substance abuse  *Chronic pain *DM2  * CHF * CAD  * COPD/asthma *Morbid Obesity * CKD *dementia *liver disease *history of stroke with residual deficits *  malignancy, * sickle cell disease  . History of amputation . Chronic anticoagulation  That are currently affecting medical management.   I expect  patient to be hospitalized for 2 midnights requiring inpatient medical care.  Patient is at high risk for adverse outcome (such as loss of life or disability) if not treated.  Indication for inpatient stay as follows:  Severe change from baseline regarding mental status Hemodynamic instability despite maximal medical therapy,  ongoing suicidal ideations,  severe pain requiring acute inpatient management,  inability to maintain oral hydration   persistent chest pain despite medical management Need for operative/procedural  intervention New or worsening hypoxia  Need for IV antibiotics, IV fluids, IV rate controling medications, IV antihypertensives, IV pain medications, IV anticoagulation    Level of care   *** tele  For 12H 24H     medical floor       SDU tele indefinitely please discontinue once patient no longer qualifies COVID-19 Labs    Lab Results  Component Value Date   [place] NEGATIVE 07/13/2020     Precautions: admitted as *** Covid Negative  ***asymptomatic screening protocol****PUI *** covid positive No active isolations ***If Covid PCR is negative  - please DC precautions - would need additional investigation given very high risk for false native test result   PPE: Used by the provider:   N95  eye Goggles,  Gloves     Vanice Rappa 07/18/2020, 11:06 PM ***  [facility]     after 2 AM please page floor coverage PA If 7AM-7PM, please contact the day team taking care of the patient using [email or web address]   Patient was evaluated in the context of  the global COVID-19 pandemic, which necessitated consideration that the patient might be at risk for infection with the SARS-CoV-2 virus that causes COVID-19. Institutional protocols and algorithms that pertain to the evaluation of patients at risk for COVID-19 are in a state of rapid change based on information released by regulatory bodies including the CDC and federal and state organizations. These policies and algorithms were followed during the patient's care.

## 2020-07-19 DIAGNOSIS — I5022 Chronic systolic (congestive) heart failure: Secondary | ICD-10-CM | POA: Diagnosis not present

## 2020-07-19 LAB — URINALYSIS, ROUTINE W REFLEX MICROSCOPIC
Bilirubin Urine: NEGATIVE
Glucose, UA: 50 mg/dL — AB
Hgb urine dipstick: NEGATIVE
Ketones, ur: NEGATIVE mg/dL
Leukocytes,Ua: NEGATIVE
Nitrite: NEGATIVE
Protein, ur: NEGATIVE mg/dL
Specific Gravity, Urine: 1.004 — ABNORMAL LOW (ref 1.005–1.030)
pH: 8 (ref 5.0–8.0)

## 2020-07-19 LAB — HEPATITIS PANEL, ACUTE
HCV Ab: NONREACTIVE
Hep A IgM: NONREACTIVE
Hep B C IgM: NONREACTIVE
Hepatitis B Surface Ag: NONREACTIVE

## 2020-07-19 LAB — RESP PANEL BY RT-PCR (FLU A&B, COVID) ARPGX2
Influenza A by PCR: NEGATIVE
Influenza B by PCR: NEGATIVE
SARS Coronavirus 2 by RT PCR: NEGATIVE

## 2020-07-19 LAB — IRON AND TIBC
Iron: 24 ug/dL — ABNORMAL LOW (ref 28–170)
Saturation Ratios: 6 % — ABNORMAL LOW (ref 10.4–31.8)
TIBC: 382 ug/dL (ref 250–450)
UIBC: 358 ug/dL

## 2020-07-19 LAB — SODIUM, URINE, RANDOM: Sodium, Ur: 76 mmol/L

## 2020-07-19 LAB — PHOSPHORUS: Phosphorus: 2.5 mg/dL (ref 2.5–4.6)

## 2020-07-19 LAB — OSMOLALITY: Osmolality: 272 mOsm/kg — ABNORMAL LOW (ref 275–295)

## 2020-07-19 LAB — PREALBUMIN: Prealbumin: 11.1 mg/dL — ABNORMAL LOW (ref 18–38)

## 2020-07-19 LAB — CK: Total CK: 219 U/L (ref 38–234)

## 2020-07-19 LAB — FERRITIN: Ferritin: 70 ng/mL (ref 11–307)

## 2020-07-19 LAB — VITAMIN B12: Vitamin B-12: 254 pg/mL (ref 180–914)

## 2020-07-19 LAB — LACTIC ACID, PLASMA: Lactic Acid, Venous: 1.5 mmol/L (ref 0.5–1.9)

## 2020-07-19 LAB — RETICULOCYTES
Immature Retic Fract: 40.3 % — ABNORMAL HIGH (ref 2.3–15.9)
RBC.: 3.78 MIL/uL — ABNORMAL LOW (ref 3.87–5.11)
Retic Count, Absolute: 87.3 10*3/uL (ref 19.0–186.0)
Retic Ct Pct: 2.3 % (ref 0.4–3.1)

## 2020-07-19 LAB — OSMOLALITY, URINE: Osmolality, Ur: 239 mOsm/kg — ABNORMAL LOW (ref 300–900)

## 2020-07-19 LAB — TSH: TSH: 3.088 u[IU]/mL (ref 0.350–4.500)

## 2020-07-19 LAB — FOLATE: Folate: 12.5 ng/mL (ref >5.9)

## 2020-07-19 LAB — TROPONIN I (HIGH SENSITIVITY): Troponin I (High Sensitivity): 25 ng/L — ABNORMAL HIGH (ref <18)

## 2020-07-19 MED ORDER — ESTROGENS, CONJUGATED 0.625 MG/GM VA CREA: 1.0000 | TOPICAL_CREAM | Freq: Every day | VAGINAL | Status: DC | PRN

## 2020-07-19 MED ORDER — ACETAMINOPHEN 500 MG PO TABS
500.0000 mg | ORAL_TABLET | Freq: Four times a day (QID) | ORAL | Status: DC | PRN
  Administered 2020-07-20: 500 mg via ORAL
  Filled 2020-07-19: qty 1

## 2020-07-19 MED ORDER — COQ10 100 MG PO CAPS: 100.0000 mg | ORAL_CAPSULE | Freq: Every day | ORAL | Status: DC

## 2020-07-19 MED ORDER — FUROSEMIDE 10 MG/ML IJ SOLN: 40.0000 mg | Freq: Once | INTRAMUSCULAR | Status: DC

## 2020-07-19 MED ORDER — ATORVASTATIN CALCIUM 40 MG PO TABS
40.0000 mg | ORAL_TABLET | Freq: Every day | ORAL | Status: DC
  Administered 2020-07-19 – 2020-07-24 (×5): 40 mg via ORAL
  Filled 2020-07-19 (×5): qty 1

## 2020-07-19 MED ORDER — METOPROLOL SUCCINATE ER 25 MG PO TB24: 25.0000 mg | ORAL_TABLET | Freq: Every day | ORAL | Status: DC

## 2020-07-19 MED ORDER — APIXABAN 2.5 MG PO TABS
2.5000 mg | ORAL_TABLET | Freq: Two times a day (BID) | ORAL | Status: DC
  Administered 2020-07-19 – 2020-07-21 (×5): 2.5 mg via ORAL
  Filled 2020-07-19 (×7): qty 1

## 2020-07-19 MED ORDER — POTASSIUM CHLORIDE CRYS ER 20 MEQ PO TBCR
20.0000 meq | EXTENDED_RELEASE_TABLET | Freq: Every day | ORAL | Status: DC
  Administered 2020-07-19 – 2020-07-24 (×5): 20 meq via ORAL
  Filled 2020-07-19 (×5): qty 1

## 2020-07-19 MED ORDER — KETOCONAZOLE 2 % EX SHAM: 1.0000 "application " | MEDICATED_SHAMPOO | Freq: Every day | CUTANEOUS | Status: DC | PRN

## 2020-07-19 MED ORDER — LEVOTHYROXINE SODIUM 25 MCG PO TABS
25.0000 ug | ORAL_TABLET | Freq: Every day | ORAL | Status: DC
  Administered 2020-07-19 – 2020-07-24 (×6): 25 ug via ORAL
  Filled 2020-07-19 (×6): qty 1

## 2020-07-19 MED ORDER — IPRATROPIUM BROMIDE 0.06 % NA SOLN
1.0000 | Freq: Two times a day (BID) | NASAL | Status: DC
  Administered 2020-07-19 – 2020-07-24 (×9): 1 via NASAL
  Filled 2020-07-19 (×2): qty 15

## 2020-07-19 MED ORDER — MAGNESIUM OXIDE 400 (241.3 MG) MG PO TABS
800.0000 mg | ORAL_TABLET | Freq: Two times a day (BID) | ORAL | Status: DC
  Administered 2020-07-19 – 2020-07-24 (×10): 800 mg via ORAL
  Filled 2020-07-19 (×10): qty 2

## 2020-07-19 MED ORDER — SERTRALINE HCL 25 MG PO TABS
25.0000 mg | ORAL_TABLET | Freq: Every day | ORAL | Status: DC
  Administered 2020-07-20 – 2020-07-24 (×4): 25 mg via ORAL
  Filled 2020-07-19 (×5): qty 1

## 2020-07-19 MED ORDER — DAPAGLIFLOZIN PROPANEDIOL 10 MG PO TABS: 10.0000 mg | ORAL_TABLET | Freq: Every day | ORAL | Status: DC

## 2020-07-19 MED ORDER — ONDANSETRON HCL 4 MG/2ML IJ SOLN: 4.0000 mg | Freq: Four times a day (QID) | INTRAMUSCULAR | Status: DC | PRN

## 2020-07-19 MED ORDER — SODIUM CHLORIDE 0.9 % IV SOLN: 250.0000 mL | INTRAVENOUS | Status: DC | PRN

## 2020-07-19 MED ORDER — ZOLPIDEM TARTRATE 5 MG PO TABS
5.0000 mg | ORAL_TABLET | Freq: Every evening | ORAL | Status: DC | PRN
  Administered 2020-07-19 – 2020-07-23 (×5): 5 mg via ORAL
  Filled 2020-07-19 (×5): qty 1

## 2020-07-19 MED ORDER — CLOBETASOL PROPIONATE 0.05 % EX CREA: 1.0000 "application " | TOPICAL_CREAM | Freq: Two times a day (BID) | CUTANEOUS | Status: DC | PRN

## 2020-07-19 MED ORDER — SODIUM CHLORIDE 0.9% FLUSH
3.0000 mL | INTRAVENOUS | Status: DC | PRN
  Administered 2020-07-23: 3 mL via INTRAVENOUS

## 2020-07-19 MED ORDER — SODIUM CHLORIDE 0.9% FLUSH
3.0000 mL | Freq: Two times a day (BID) | INTRAVENOUS | Status: DC
  Administered 2020-07-19 – 2020-07-22 (×7): 3 mL via INTRAVENOUS

## 2020-07-19 MED ORDER — SPIRONOLACTONE 25 MG PO TABS
25.0000 mg | ORAL_TABLET | Freq: Every day | ORAL | Status: DC
  Administered 2020-07-19 – 2020-07-23 (×6): 25 mg via ORAL
  Filled 2020-07-19 (×8): qty 1

## 2020-07-19 NOTE — Progress Notes (Signed)
Progress Note  Patient Name: Cynthia Frank Date of Encounter: 07/19/2020  [facility] Cardiologist: No primary care provider on file.   Subjective   Breathing is better but still not at baseline.  Received IV Lasix yesterday.  Blood pressure remains soft.  This is at baseline.  Inpatient Medications    Scheduled Meds: . apixaban  2.5 mg Oral BID  . atorvastatin  40 mg Oral Daily  . furosemide  40 mg Intravenous Once  . ipratropium  1 spray Each Nare BID  . levothyroxine  25 mcg Oral QAC breakfast  . magnesium oxide  800 mg Oral BID  . potassium chloride SA  20 mEq Oral Daily  . sertraline  25 mg Oral Daily  . sodium chloride flush  3 mL Intravenous Q12H  . spironolactone  25 mg Oral QHS   Continuous Infusions: . sodium chloride     PRN Meds: sodium chloride, acetaminophen, clobetasol cream, conjugated estrogens, ketoconazole, ondansetron (ZOFRAN) IV, sodium chloride flush   Vital Signs    Vitals:   07/19/20 0900 07/19/20 1000 07/19/20 1030 07/19/20 1300  BP: _0 92/69  Pulse: 96 92 92 96  Resp: (!) _1 Temp:      TempSrc:      SpO2: 98% 99% 97% 97%  Weight:      Height:       No intake or output data in the 24 hours ending 07/19/20 1332 Last 3 Weights 07/18/2020 07/15/2020 07/08/2020  Weight (lbs) 126 lb 126 lb 122 lb  Weight (kg) 57.153 kg 57.153 kg 55.339 kg      Telemetry    Paced rhythm - Personally Reviewed   Physical Exam   GEN: No acute distress.  Thin Neck: No JVD Cardiac: RRR, no murmurs, rubs, or gallops.  Respiratory: Clear to auscultation bilaterally. GI: Soft, nontender, non-distended  MS: No edema; No deformity. Neuro:  Nonfocal  Psych: Normal affect   Labs    High Sensitivity Troponin:   Recent Labs  Lab 07/18/20 2026 07/19/20 0035  TROPONINIHS 24* 25*      Chemistry Recent Labs  Lab 07/18/20 2026  NA 123*  K 3.7  CL 90*  CO2 24  GLUCOSE 111*  BUN 25*  CREATININE 1.46*  CALCIUM 7.9*   PROT 6.2*  ALBUMIN 2.8*  AST 123*  ALT 76*  ALKPHOS 102  BILITOT 1.4*  GFRNONAA 36*  ANIONGAP 9     Hematology Recent Labs  Lab 07/18/20 2026 07/18/20 2340  WBC 6.0  --   RBC 3.97 3.78*  HGB 10.9*  --   HCT 34.0*  --   MCV 85.6  --   MCH 27.5  --   MCHC 32.1  --   RDW 18.4*  --   PLT 147*  --     BNP Recent Labs  Lab 07/18/20 2026  BNP 2,412.6*     DDimer No results for input(s): DDIMER in the last 168 hours.   Radiology    DG Chest Portable 1 View  Result Date: 07/18/2020 CLINICAL DATA:  85 year old female with shortness of breath. EXAM: PORTABLE CHEST 1 VIEW COMPARISON:  Chest radiograph dated 05/09/2019. FINDINGS: Left lung base linear atelectasis/scarring. No focal consolidation or pneumothorax. Trace left pleural effusion is suspected. There is cardiomegaly. Atherosclerotic calcification of the aorta. Left pectoral AICD device. No acute osseous pathology. IMPRESSION: 1. Trace left pleural effusion and left lung base atelectasis. 2. Cardiomegaly. Electronically Signed   By: Milas Hock  Radparvar M.D.   On: 07/18/2020 20:38    Cardiac Studies   TEE 07/15/20   1. Severe Mitral Regurgitation with pulmonary vein flow reversal and ERO  43 mm2. Mechanism appears to be atrial function and malcoaptation of P2-P3  as largest contributor. Estimated MVA 4.80 cm PML length 1.35 mm without  significant MAC. The mitral  valve is grossly normal. . The mean mitral valve gradient is 1.0 mmHg with  average heart rate of 80 bpm.  2. Left atrial size was moderately dilated. No left atrial/left atrial  appendage thrombus was detected.  3. Left ventricular ejection fraction, by estimation, is 20 to 25%. The  left ventricle has severely decreased function. The left ventricle  demonstrates global hypokinesis. The left ventricular internal cavity size  was moderately to severely dilated.  4. Right ventricular systolic function is moderately reduced. The right  ventricular size  is normal.  5. Right atrial size was mild to moderately dilated.  6. Moderate and eccentric tricuspid regurgitation without evidence of  disastolic TR to favor device related.  7. The aortic valve is tricuspid. Aortic valve regurgitation is mild.  8. There is mild (Grade II) plaque involving the descending aorta.   Comparison(s): A prior study was performed on 08/14/19. Worsening of mitral  regurgitation and left ventricular ejection fraction.   Patient Profile     85 y.o. female  with niCM, HFrEF (25%), DM, HTN, HLD, hypothyroid, and AF s/p recent ablation (07/15/20) who presents with "feeling off." She has evidence of moderate volume overload on exam and multiple metabolic abnormalities that are further consistent with volume retention. Her sodium has been trending downward for over 1 week now so it is possible that she has slowly been accumulating volume and the AF ablation that she recently underwent could have pushed her into decompensated HF. We will admit and plan to treat with diuretics, monitoring her for improvement in her symptoms and in her metabolic disarray.   Assessment & Plan    Hyponatremia Acute systolic heart failure Severe mitral regurgitation -Received IV Lasix 40 mg overnight -Currently on spironolactone and Farxiga as outpatient -Metoprolol has been held because of low blood pressure -Potassium supplementation 20 mEq daily has been administered. -Hopefully hyponatremia will continue to improve with diuresis. -She does state that she drinks water fairly frequently at home because of dry mouth.  This could be contributing to her hyponatremia as well. -Does not tolerate Entresto. Challenging fine balance for her.  Atrial fibrillation ablation on 3/30 -Currently biventricular pacing. -LFTs have increased, perhaps congestive.  Holding amiodarone for now.  Likely be able to resume as outpatient to ward off further recurrence of atrial fibrillation.  In review of Dr.  Claris Gladden note LFTs have been mildly elevated in the past on amiodarone but abdominal ultrasound did not show any liver abnormalities.  Chronic anticoagulation -On Eliquis.  No changes.  Holding metoprolol.  Diabetes mellitus -On Farxiga  Tomorrow, would consider consultation with Dr. Aundra Dubin in advanced heart failure team to collaborate.      For questions or updates, please contact [facility] [place] Please consult [email or web address] for contact info under        Signed, Candee Furbish, MD  07/19/2020, 1:32 PM

## 2020-07-19 NOTE — Progress Notes (Signed)
PT. Requesting med for sleep. On call for Cardiology paged to make aware.

## 2020-07-19 NOTE — ED Notes (Signed)
Pt ambulated to the restroom with assistance.  

## 2020-07-19 NOTE — ED Notes (Signed)
Attempted to call report. RN unavailable 

## 2020-07-19 NOTE — ED Notes (Signed)
Pt assisted to restroom.  

## 2020-07-19 NOTE — ED Notes (Signed)
S/W Dr. Neena Rhymes via phone and he advised to hold metoprolol at this time and administer the aldactone.

## 2020-07-20 DIAGNOSIS — I2583 Coronary atherosclerosis due to lipid rich plaque: Secondary | ICD-10-CM | POA: Diagnosis not present

## 2020-07-20 DIAGNOSIS — I2510 Atherosclerotic heart disease of native coronary artery without angina pectoris: Secondary | ICD-10-CM | POA: Diagnosis not present

## 2020-07-20 DIAGNOSIS — I5043 Acute on chronic combined systolic (congestive) and diastolic (congestive) heart failure: Secondary | ICD-10-CM | POA: Diagnosis not present

## 2020-07-20 DIAGNOSIS — I480 Paroxysmal atrial fibrillation: Secondary | ICD-10-CM

## 2020-07-20 DIAGNOSIS — I5022 Chronic systolic (congestive) heart failure: Secondary | ICD-10-CM | POA: Diagnosis not present

## 2020-07-20 LAB — BASIC METABOLIC PANEL
Anion gap: 10 (ref 5–15)
Anion gap: 12 (ref 5–15)
BUN: 24 mg/dL — ABNORMAL HIGH (ref 8–23)
BUN: 24 mg/dL — ABNORMAL HIGH (ref 8–23)
CO2: 22 mmol/L (ref 22–32)
CO2: 22 mmol/L (ref 22–32)
Calcium: 8.2 mg/dL — ABNORMAL LOW (ref 8.9–10.3)
Calcium: 8.2 mg/dL — ABNORMAL LOW (ref 8.9–10.3)
Chloride: 94 mmol/L — ABNORMAL LOW (ref 98–111)
Chloride: 91 mmol/L — ABNORMAL LOW (ref 98–111)
Creatinine, Ser: 1.27 mg/dL — ABNORMAL HIGH (ref 0.44–1.00)
Creatinine, Ser: 1.36 mg/dL — ABNORMAL HIGH (ref 0.44–1.00)
GFR, Estimated: 42 mL/min — ABNORMAL LOW (ref >60)
GFR, Estimated: 39 mL/min — ABNORMAL LOW (ref >60)
Glucose, Bld: 101 mg/dL — ABNORMAL HIGH (ref 70–99)
Glucose, Bld: 171 mg/dL — ABNORMAL HIGH (ref 70–99)
Potassium: 4 mmol/L (ref 3.5–5.1)
Potassium: 3.9 mmol/L (ref 3.5–5.1)
Sodium: 126 mmol/L — ABNORMAL LOW (ref 135–145)
Sodium: 125 mmol/L — ABNORMAL LOW (ref 135–145)

## 2020-07-20 LAB — SODIUM: Sodium: 125 mmol/L — ABNORMAL LOW (ref 135–145)

## 2020-07-20 LAB — HEPATIC FUNCTION PANEL
ALT: 113 U/L — ABNORMAL HIGH (ref 0–44)
AST: 153 U/L — ABNORMAL HIGH (ref 15–41)
Albumin: 2.7 g/dL — ABNORMAL LOW (ref 3.5–5.0)
Alkaline Phosphatase: 92 U/L (ref 38–126)
Bilirubin, Direct: 0.7 mg/dL — ABNORMAL HIGH (ref 0.0–0.2)
Indirect Bilirubin: 1 mg/dL — ABNORMAL HIGH (ref 0.3–0.9)
Total Bilirubin: 1.7 mg/dL — ABNORMAL HIGH (ref 0.3–1.2)
Total Protein: 6.1 g/dL — ABNORMAL LOW (ref 6.5–8.1)

## 2020-07-20 MED ORDER — FUROSEMIDE 10 MG/ML IJ SOLN
40.0000 mg | Freq: Two times a day (BID) | INTRAMUSCULAR | Status: DC
  Administered 2020-07-20 – 2020-07-22 (×5): 40 mg via INTRAVENOUS
  Filled 2020-07-20 (×5): qty 4

## 2020-07-20 MED ORDER — DIGOXIN 125 MCG PO TABS: 0.1250 mg | ORAL_TABLET | Freq: Every day | ORAL | Status: DC

## 2020-07-20 MED ORDER — DIGOXIN 125 MCG PO TABS
0.0625 mg | ORAL_TABLET | Freq: Every day | ORAL | Status: DC
  Administered 2020-07-20 – 2020-07-24 (×5): 0.0625 mg via ORAL
  Filled 2020-07-20 (×5): qty 1

## 2020-07-20 MED ORDER — SODIUM CHLORIDE 0.9 % IV SOLN
510.0000 mg | Freq: Once | INTRAVENOUS | Status: AC
  Administered 2020-07-20: 510 mg via INTRAVENOUS
  Filled 2020-07-20: qty 17

## 2020-07-20 MED ORDER — TOLVAPTAN 15 MG PO TABS
15.0000 mg | ORAL_TABLET | Freq: Once | ORAL | Status: AC
Start: 2020-07-20 — End: 2020-07-20
  Administered 2020-07-20: 15 mg via ORAL
  Filled 2020-07-20: qty 1

## 2020-07-20 NOTE — Consult Note (Addendum)
Advanced Heart Failure Team Consult Note   Primary Physician: [name], MD PCP-Cardiologist:  Dr. Aundra Dubin EP: Dr. Curt Bears   Reason for Consultation: acute on chronic systolic heart failure   HPI:    Cynthia Frank is seen today for evaluation of acute on chronic systolic heart failure at the request of Dr. Johney Frame, Cardiology.   Cynthia Frank is a 85 y.o. female who has a history of chronic systolic CHF/nonischemic cardiomyopathy, paroxysmal atrial fibrillation, and prior ovarian and colon cancers. She has had a cardiomyopathy known for >15 years now. Cardiac cath at diagnosis showed mild nonobstructive disease. EF has been persistently low. Cause has been thought to be Adriamycin; she received this as part of her ovarian cancer treatment.   Cardiac MRI was done in 1/17, showing EF 29% with normal RV. Septal-lateral dyssynchrony was present and there was a non-coronary pattern of LGE in the septum. LFTs were noted to be mildly elevated. Amiodarone was decreased to 100 mg daily.   She had Medtronic CRT-D placed in 3/17. Repeat echo 8/17 showed EF 35% with diffuse hypokinesis, normal RV. Echo in 4/19 showed EF 30-35%, mild AI.    In 8/20, she had an episode of lightheadedness with standing followed by syncope and fall with right hip fracture.  She was admitted, had repair of right hip.  She was noted to be orthostatic and Entresto was stopped.  Echo in 8/20 showed EF 45-50% with normal RV systolic function.  In 1/21, patient was in atrial tachycardia and had been in it for several weeks.  She was volume overloaded.  Lasix was increased and she was brought into the hospital for DCCV, but she had converted to NSR on her own.    In 4/21, she had atrial fibrillation ablation.  TEE done in 4/21 showed EF 30-35% with global HK.  She is now off amiodarone.   Echo in 11/21 showed EF 25-30% with mild LV dilation, global hypokinesis with septal-lateral  dyssynchrony, moderate MR, moderately decreased RV systolic function with normal RV size, IVC normal.   She went into atrial flutter in 11/21 and had DCCV back to NSR in 1/22.  She stayed in NSR for a few weeks but is back in atrial fibrillation/flutter again.  She had COVID-19 in 2/22.   Given persistent atrial fibrillation, she was referred to EP and underwent repeat afib ablation on 07/15/20. TEE was performed and showed severe MR (worse from prior study). LVEF was 20-25%. RV was moderately reduced.   She was discharged home on 4/1 but didn't feel well post procedure, "felt off" and light headed, and subsequently came back through the ED where she was found to be fluid overloaded w/ low BP w/ SPBs in the 90s. BNP elevated at 241. EKG showed atrial sensed, V paced rhythm. Also w/ hyponatremia w/ Na at 123. SCr 1.46, close to baseline. She was admitted by cardiology and started on IV Lasix.   Per MAR, she received 1 dose of IV Lasix in ED but dose was held yesterday? Unsure why but suspect likely due to low BP.   Wt is down 5 lb since admit. I/os not accurate. SCr improved, 1.46>>127. K improved but remains low at 126. She is sitting up in chair w/ no active dyspnea upright. Feels ok. Endorsed chronic NYHA Class III symptoms. No CP.    TEE 07/15/20 IMPRESSIONS   1. Severe Mitral Regurgitation with pulmonary vein flow reversal and ERO  43 mm2. Mechanism appears  to be atrial function and malcoaptation of P2-P3  as largest contributor. Estimated MVA 4.80 cm PML length 1.35 mm without  significant MAC. The mitral  valve is grossly normal. . The mean mitral valve gradient is 1.0 mmHg with  average heart rate of 80 bpm.  2. Left atrial size was moderately dilated. No left atrial/left atrial  appendage thrombus was detected.  3. Left ventricular ejection fraction, by estimation, is 20 to 25%. The  left ventricle has severely decreased function. The left ventricle  demonstrates global  hypokinesis. The left ventricular internal cavity size  was moderately to severely dilated.  4. Right ventricular systolic function is moderately reduced. The right  ventricular size is normal.  5. Right atrial size was mild to moderately dilated.  6. Moderate and eccentric tricuspid regurgitation without evidence of  disastolic TR to favor device related.  7. The aortic valve is tricuspid. Aortic valve regurgitation is mild.  8. There is mild (Grade II) plaque involving the descending aorta.   Comparison(s): A prior study was performed on 08/14/19. Worsening of mitral  regurgitation and left ventricular ejection fraction.   Review of Systems: [y] = yes, _0  = no   . General: Weight gain _1 ; Weight loss _2 ; Anorexia _3 ; Fatigue [Y ]; Fever _4 ; Chills _5 ; Weakness _6   . Cardiac: Chest pain/pressure _7 ; Resting SOB _8 ; Exertional SOB [Y ]; Orthopnea _9 ; Pedal Edema _10 ; Palpitations _11 ; Syncope _12 ; Presyncope [Y ]; Paroxysmal nocturnal dyspnea_13   . Pulmonary: Cough _14 ; Wheezing_15 ; Hemoptysis_16 ; Sputum _17 ; Snoring _18   . GI: Vomiting_19 ; Dysphagia_20 ; Melena_21 ; Hematochezia _22 ; Heartburn_23 ; Abdominal pain _24 ; Constipation _25 ; Diarrhea _26 ; BRBPR _27   . GU: Hematuria_28 ; Dysuria _29 ; Nocturia_30   . Vascular: Pain in legs with walking _31 ; Pain in feet with lying flat _32 ; Non-healing sores _33 ; Stroke _34 ; TIA _35 ; Slurred speech _36 ;  . Neuro: Headaches_37 ; Vertigo_38 ; Seizures_39 ; Paresthesias_40 ;Blurred vision _41 ; Diplopia _42 ; Vision changes _43   . Ortho/Skin: Arthritis _44 ; Joint pain _45 ; Muscle pain _46 ; Joint swelling _47 ; Back Pain _48 ; Rash _49   . Psych: Depression_50 ; Anxiety_51   . Heme: Bleeding problems _52 ; Clotting disorders _53 ; Anemia _54   . Endocrine: Diabetes _55 ; Thyroid dysfunction_56   Home Medications Prior to Admission medications   Medication Sig Start Date End Date Taking? Authorizing Provider  acetaminophen (TYLENOL) 500 MG tablet Take 500  mg by mouth every 6 (six) hours as needed for moderate pain or headache.   Yes [provider]  amiodarone (PACERONE) 200 MG tablet Take 1 tablet (200 mg total) by mouth daily. 06/23/20  Yes Larey Dresser, MD  atorvastatin (LIPITOR) 40 MG tablet TAKE 1 TABLET(40 MG) BY MOUTH DAILY AT 6 PM Patient taking differently: Take 40 mg by mouth at bedtime. 12/13/19  Yes Camnitz, Will Hassell Done, MD  clobetasol (TEMOVATE) 0.05 % external solution Apply 1 application topically 2 (two) times daily. 06/03/20  Yes [provider]  Coenzyme Q10 (COQ10) 100 MG CAPS Take 100 mg by mouth daily.   Yes [provider]  conjugated estrogens (PREMARIN) vaginal cream Place 1 Applicatorful vaginally daily as needed (dyspareunia (Use as directed)).    Yes [provider]  dapagliflozin propanediol (FARXIGA) 10 MG TABS  tablet Take 1 tablet (10 mg total) by mouth daily before breakfast. 03/17/20  Yes Larey Dresser, MD  diclofenac Sodium (VOLTAREN) 1 % GEL Apply 2 g topically daily.   Yes [provider]  ELIQUIS 2.5 MG TABS tablet TAKE 1 TABLET(2.5 MG) BY MOUTH TWICE DAILY Patient taking differently: Take 2.5 mg by mouth 2 (two) times daily. 02/17/20  Yes Camnitz, Will Hassell Done, MD  furosemide (LASIX) 40 MG tablet Take 1 tablet (40 mg total) by mouth 2 (two) times daily. 07/14/20  Yes Larey Dresser, MD  ipratropium (ATROVENT) 0.03 % nasal spray Place 1 spray into both nostrils 2 (two) times daily. 04/26/20  Yes [provider]  ketoconazole (NIZORAL) 2 % shampoo Apply 1 application topically 3 (three) times a week. 12/13/19  Yes [provider]  levothyroxine (SYNTHROID) 25 MCG tablet TAKE 1 TABLET(25 MCG) BY MOUTH DAILY BEFORE BREAKFAST Patient taking differently: Take 25 mcg by mouth daily before breakfast. 03/23/20  Yes Larey Dresser, MD  Magnesium Oxide 500 MG CAPS Take 1,000 mg by mouth 2 (two) times daily.    Yes [provider]  metoprolol succinate  (TOPROL-XL) 25 MG 24 hr tablet Take 1 tablet (25 mg total) by mouth at bedtime. Patient taking differently: Take 25 mg by mouth daily. 06/17/19  Yes Larey Dresser, MD  potassium chloride SA (KLOR-CON) 20 MEQ tablet Take 1 tablet (20 mEq total) by mouth daily. 03/17/20  Yes Larey Dresser, MD  sertraline (ZOLOFT) 25 MG tablet TAKE 1 TABLET(25 MG) BY MOUTH DAILY Patient taking differently: Take 25 mg by mouth daily. 07/14/20  Yes Larey Dresser, MD  spironolactone (ALDACTONE) 25 MG tablet TAKE 1 TABLET(25 MG) BY MOUTH AT BEDTIME Patient taking differently: Take 25 mg by mouth at bedtime. 07/14/20  Yes Larey Dresser, MD  zolpidem (AMBIEN) 5 MG tablet Take 5 mg by mouth at bedtime as needed for sleep.   Yes [provider]    Past Medical History: Past Medical History:  Diagnosis Date  . AICD (automatic cardioverter/defibrillator) present   . Arthritis    "left ankle" (07/14/2015)  . Atrial fibrillation ([place])   . Cataract    bilateral -not a surgical candidate  . CHF (congestive heart failure) ([place])   . Colon cancer ([place]) 03/2006   T3, N0  . Colon polyps 12/07/2010  . Coronary artery disease    nonobstructive with 30% D1  . Diabetes mellitus without complication ([place])    on meds  . GERD (gastroesophageal reflux disease)    OTC PRN -diet controlled  . History of ovarian cancer 1985  . Hx: UTI (urinary tract infection)   . Hyperlipidemia    on meds  . Hypertension    on meds  . Internal hemorrhoid   . Nonischemic cardiomyopathy ([place])    last EF assessment 40% by MUGA  . Osteoporosis    Prolia once per year  . Partial bowel obstruction ([place])   . PVC (premature ventricular contraction)   . Thyroid disease    on meds    Past Surgical History: Past Surgical History:  Procedure Laterality Date  . ABDOMINAL HYSTERECTOMY  1979  . ANKLE CLOSED REDUCTION  10/16/2011   Procedure: CLOSED REDUCTION ANKLE;  Surgeon: Tobi Bastos, MD;  Location: [facility];  Service:  Orthopedics;  Laterality: Left;  . ATRIAL FIBRILLATION ABLATION N/A 08/14/2019   Procedure: ATRIAL FIBRILLATION ABLATION;  Surgeon: Constance Haw, MD;  Location: [facility];  Service: Cardiovascular;  Laterality: N/A;  . ATRIAL FIBRILLATION ABLATION N/A 07/15/2020   Procedure: ATRIAL FIBRILLATION ABLATION;  Surgeon: Constance Haw, MD;  Location: [facility] [place] [facility];  Service: Cardiovascular;  Laterality: N/A;  . CARDIOVERSION N/A 04/20/2020   Procedure: CARDIOVERSION;  Surgeon: Larey Dresser, MD;  Location: [facility];  Service: Cardiovascular;  Laterality: N/A;  . COLECTOMY  2007   for colon cancer  . COLONOSCOPY N/A 03/11/2013   Procedure: COLONOSCOPY;  Surgeon: Ladene Artist, MD;  Location: [facility];  Service: Endoscopy;  Laterality: N/A;  . COLONOSCOPY  2019   MS-plenvu(good)-int hems-SSA x 2  . COLONOSCOPY WITH PROPOFOL N/A 04/21/2015   Procedure: COLONOSCOPY WITH PROPOFOL;  Surgeon: Ladene Artist, MD;  Location: [facility];  Service: Endoscopy;  Laterality: N/A;  . COLONOSCOPY WITH PROPOFOL N/A 02/05/2018   Procedure: COLONOSCOPY WITH PROPOFOL Hemostasis Clip;  Surgeon: Ladene Artist, MD;  Location: [facility];  Service: Endoscopy;  Laterality: N/A;  . EP IMPLANTABLE DEVICE N/A 07/14/2015   Procedure: BiV ICD Insertion CRT-D;  Surgeon: Evans Lance, MD;  Location: [place] [facility];  Service: Cardiovascular;  Laterality: N/A;  . ESOPHAGOGASTRODUODENOSCOPY (EGD) WITH PROPOFOL N/A 04/21/2015   Procedure: ESOPHAGOGASTRODUODENOSCOPY (EGD) WITH PROPOFOL;  Surgeon: Ladene Artist, MD;  Location: [facility];  Service: Endoscopy;  Laterality: N/A;  . FRACTURE SURGERY    . INSERTION OF ICD Left 07/14/2015  . LAPAROTOMY  1980   "adhesions"  . LAPAROTOMY  1989   laparotomy for takedown of intestinal obstruction secondary to adhesions   . OOPHORECTOMY  1961  . OVARY SURGERY Right 1985   resection of right ovarian cancer  . POLYPECTOMY  02/05/2018    Procedure: POLYPECTOMY;  Surgeon: Ladene Artist, MD;  Location: [facility];  Service: Endoscopy;;  . POLYPECTOMY  2018   SSA x 2  . SMALL INTESTINE SURGERY  1985  . TEE WITHOUT CARDIOVERSION N/A 08/14/2019   Procedure: TRANSESOPHAGEAL ECHOCARDIOGRAM (TEE);  Surgeon: Constance Haw, MD;  Location: [place] [facility];  Service: Cardiovascular;  Laterality: N/A;  . TEE WITHOUT CARDIOVERSION N/A 07/15/2020   Procedure: TRANSESOPHAGEAL ECHOCARDIOGRAM (TEE);  Surgeon: Constance Haw, MD;  Location: [place] [facility];  Service: Cardiovascular;  Laterality: N/A;  . TONSILLECTOMY  1944  . TOTAL HIP ARTHROPLASTY Right 12/18/2018   Procedure: TOTAL HIP ARTHROPLASTY ANTERIOR APPROACH;  Surgeon: Rod Can, MD;  Location: [facility];  Service: Orthopedics;  Laterality: Right;    Family History: Family History  Problem Relation Age of Onset  . Uterine cancer Mother   . Prostate cancer Father   . Heart disease Father   . Heart attack Father   . Colon cancer Neg Hx   . Colon polyps Neg Hx   . Esophageal cancer Neg Hx   . Rectal cancer Neg Hx   . Stomach cancer Neg Hx     Social History: Social History   Socioeconomic History  . Marital status: Married    Spouse name: Not on file  . Number of children: 2  . Years of education: Not on file  . Highest education level: Not on file  Occupational History  . Occupation: retired  Tobacco Use  . Smoking status: Former Smoker    Packs/day: 0.50    Years: 20.00    Pack years: 10.00    Types: Cigarettes    Quit date: 04/18/1978    Years since quitting: 42.2  . Smokeless tobacco: Never Used  Vaping Use  .  Vaping Use: Never used  Substance and Sexual Activity  . Alcohol use: Yes    Alcohol/week: 14.0 standard drinks    Types: 14 Standard drinks or equivalent per week    Comment: has wine before dinner   . Drug use: No  . Sexual activity: Not Currently  Other Topics Concern  . Not on file  Social History Narrative   Patient  had never smoked.   Alcohol use- yes   Daily caffeine use- coffee   Illicit drug use- no   Occupation:retired    Social Determinants of Radio broadcast assistant Strain: Not on file  Food Insecurity: Not on file  Transportation Needs: Not on file  Physical Activity: Not on file  Stress: Not on file  Social Connections: Not on file    Allergies:  Allergies  Allergen Reactions  . Clindamycin/Lincomycin Itching  . Dramamine Ii [Meclizine Hcl] Other (See Comments)    hyper  . Pneumococcal Vaccines Itching and Swelling    Redness   . Sulfonamide Derivatives Itching and Swelling    Objective:    Vital Signs:   Temp:  [97.5 F (36.4 C)-98.3 F (36.8 C)] 97.7 F (36.5 C) (04/04 0753) Pulse Rate:  [63-98] 93 (04/04 0753) Resp:  [16-20] 18 (04/04 0753) BP: (85-170)/(63-88) 85/63 (04/04 0753) SpO2:  [94 %-100 %] 96 % (04/04 0753) Weight:  [55.2 kg-55.5 kg] 55.2 kg (04/04 0440) Last BM Date: 07/18/20  Weight change: Filed Weights   07/18/20 1924 07/19/20 1427 07/20/20 0440  Weight: 57.2 kg 55.5 kg 55.2 kg    Intake/Output:   Intake/Output Summary (Last 24 hours) at 07/20/2020 0944 Last data filed at 07/19/2020 1929 Gross per 24 hour  Intake --  Output 300 ml  Net -300 ml      Physical Exam    General:  Thin, elderly WF sitting up in chair. No resp difficulty HEENT: normal Neck: supple. JVP 10 cm . Carotids 2+ bilat; no bruits. No lymphadenopathy or thyromegaly appreciated. Cor: PMI nondisplaced. Regular rate & rhythm. 3/6 MR murmur at apex  Lungs: decreased BS at the bases bilaterally  Abdomen: soft, nontender, nondistended. No hepatosplenomegaly. No bruits or masses. Good bowel sounds. Extremities: no cyanosis, clubbing, rash, trace bilateral ankle edema edema Neuro: alert & orientedx3, cranial nerves grossly intact. moves all 4 extremities w/o difficulty. Affect pleasant   Telemetry   V paced 80s   EKG    Atrial sensed, V paced rhythm   Labs    Basic Metabolic Panel: Recent Labs  Lab 07/18/20 2026 07/19/20 0035 07/20/20 0701  NA 123*  --  126*  K 3.7  --  4.0  CL 90*  --  94*  CO2 24  --  22  GLUCOSE 111*  --  101*  BUN 25*  --  24*  CREATININE 1.46*  --  1.27*  CALCIUM 7.9*  --  8.2*  MG 2.2  --   --   PHOS  --  2.5  --     Liver Function Tests: Recent Labs  Lab 07/18/20 2026  AST 123*  ALT 76*  ALKPHOS 102  BILITOT 1.4*  PROT 6.2*  ALBUMIN 2.8*   No results for input(s): LIPASE, AMYLASE in the last 168 hours. No results for input(s): AMMONIA in the last 168 hours.  CBC: Recent Labs  Lab 07/18/20 2026  WBC 6.0  NEUTROABS 4.8  HGB 10.9*  HCT 34.0*  MCV 85.6  PLT 147*    Cardiac Enzymes: Recent  Labs  Lab 07/19/20 0035  CKTOTAL 219    BNP: BNP (last 3 results) Recent Labs    07/18/20 2026  BNP 2,412.6*    ProBNP (last 3 results) No results for input(s): PROBNP in the last 8760 hours.   CBG: Recent Labs  Lab 07/15/20 0641 07/15/20 1114  GLUCAP 111* 103*    Coagulation Studies: No results for input(s): LABPROT, INR in the last 72 hours.   Imaging    No results found.   Medications:     Current Medications: . apixaban  2.5 mg Oral BID  . atorvastatin  40 mg Oral Daily  . furosemide  40 mg Intravenous Once  . ipratropium  1 spray Each Nare BID  . levothyroxine  25 mcg Oral QAC breakfast  . magnesium oxide  800 mg Oral BID  . potassium chloride SA  20 mEq Oral Daily  . sertraline  25 mg Oral Daily  . sodium chloride flush  3 mL Intravenous Q12H  . spironolactone  25 mg Oral QHS  . tolvaptan  15 mg Oral Once     Infusions: . sodium chloride         Assessment/Plan   1. Acute on Chronic Biventricular Systolic Heart Failure: EF 20-25% with diffuse hypokinesis on 12/16 echo. Nonischemic cardiomyopathy, probably related to Adriamycin with ovarian cancer treatment. Cardiac MRI in 1/17 showed EF 29% with normal RV and septal-lateral dyssynchrony. There was a  non-cardiac pattern of LGE in the septum, cannot rule out prior myocarditis based on this pattern. She has a Medtronic CRT-D device. Echo 4/19 with EF 30-35%, then echo in 8/20 showed EF up to 45-50%.  TEE in 3/21 showed EF 30-35%. Echo in 11/21 showed EF 25-30% with mild LV dilation, global hypokinesis with septal-lateral dyssynchrony, moderate MR, moderately decreased RV systolic function with normal RV size, IVC normal. TEE 3/30 showed LVEF 20-25%, w/ severe MR and moderately reduced RV systolic function.  - Now admitted w/ a/c systolic CHF w/ fluid overloaded post recent Afib ablation. Chronically NYHA Class III - remains fluid overloaded on exam, continue IV Lasix. Increase to 40 mg bid  - continue spironolactone 25 mg qhs  - did not tolerate Entersto in the past due to orthostatic hypotension and syncope  - Toprol XL and Farxiga on hold for low BP  - consider starting digoxin w/ biventricular failure and concomitant PAF    2. Severe Mitral Regurgitation  - 2D echo 11/21 w/ mod MR - recent TEE 07/15/20 w/ progression to now severe MR  - ? MitraClip   3. Atrial Fibrillation/ Flutter: She had atrial fibrillation ablation in 4/21.  She went into atrial flutter in 11/21, she had DCCV in 12/21 w/ recurrence of Afib early 2022. She has had elevated LFTs w/ amiodarone and her QT interval has been too long for Tikosyn. Recently referred back to EP and underwent repeat Afib ablation 07/15/20.  - continue Eliquis 2.5 mg bid (reduced dose due to age and wt)  - amio on hold due to elevated LFTs - Toprol on hold due to low BP - add digoxin for rate control + systolic heart failure  4. Hyponatremia  - Na 123 on admit - suspect 2/2 hypervolemia - improving w/ diuresis but still low at 126 today  - give dose of Tolvaptan and follow   5. Elevated LFTs - h/o mildly elevated LFTs in the past w/ amiodarone use - mildly elevated on admit, AST 123, ALT 76 (on amio 200 mg  daily PTA)   6.  Hypothyroidism - on levothyroxine   7. Stage III CKD - baseline SCr ~1.3 - 1.46 on admit>>1.27 today  - follow w/ diuresis   Length of Stay: 2  Lyda Jester, PA-C  07/20/2020, 9:44 AM  Advanced Heart Failure Team Pager [phone] (M-F; 7a - 5p)  Please contact [facility] for night-coverage after hours (4p -7a ) and weekends on [email or web address]  Patient seen with PA, agree with the above note.   85 y.o. with the above medical history developed worsening exertional dyspnea => dyspnea at rest and extreme fatigue on Saturday, went to the ER.  She has had a progressive decline over 2 years, but has been worse in recent months with recurrent atrial fibrillation and loss of BiV pacing.  She has also developed severe MR.    She had redo AF ablation last week and is in NSR today.  TEE pre-ablation showed severe MR, progressive.    Currently feels better after getting a dose of IV Lasix.  Na was 123 at admission, now 126.  SBP 90s, this is baseline for her.   General: NAD Neck: JVP 14-16 cm, no thyromegaly or thyroid nodule.  Lungs: Clear to auscultation bilaterally with normal respiratory effort. CV: Lateral PMI.  Heart regular S1/S2, no S3/S4, 3/6 HSM apex.  1+ edema at ankles.  No carotid bruit.  Normal pedal pulses.  Abdomen: Soft, nontender, no hepatosplenomegaly, no distention.  Skin: Intact without lesions or rashes.  Neurologic: Alert and oriented x 3.  Psych: Normal affect. Extremities: No clubbing or cyanosis.  HEENT: Normal.   1. Acute on chronic systolic CHF: EF 41-58% with diffuse hypokinesis on 12/16 echo. Nonischemic cardiomyopathy, probably related to Adriamycin with ovarian cancer treatment. Cardiac MRI in 1/17 showed EF 29% with normal RV and septal-lateral dyssynchrony. There was a non-cardiac pattern of LGE in the septum, cannot rule out prior myocarditis based on this pattern. She has a Medtronic CRT-D device. Echo 4/19 with EF 30-35%, then echo in 8/20 showed EF up  to 45-50%.  TEE in 3/21 showed EF 30-35%. Echo in 11/21 showed EF 25-30% with mild LV dilation, global hypokinesis with septal-lateral dyssynchrony, moderate MR, moderately decreased RV systolic function with normal RV size, IVC normal.  TEE in 3/22 showed EF 20-25%, moderate LV dilation, moderately decreased RV systolic function, moderate TR, and severe MR with malcoaptation of P2/P3 scallops.  She was admitted with worsening dyspnea/volume overload.  She has been in NSR while here.  SBP generally 90s, this is where she runs at home. On exam today, she is volume overloaded.  - Stop Toprol XL while markedly volume overloaded and with soft BP.   - Continue spironolactone 25 mg daily.  - She will stay off Entresto given syncope and lightheadedness while taking it.  Will not start losartan with soft BP.  - Lasix 40 mg IV bid.  - Hold Farxiga for now.  - Add digoxin, would use low dose 0.0625 to start with given CKD 3 and small size.  - Severe MR has worsened HF, will need evaluation for Mitraclip (see below).  - RHC probably Wednesday after further diuresis.  Not candidate for advanced therapies with age.  2. Atrial arrhythmias: She needs to maintain NSR as she loses BiV pacing when she goes into atrial arrhythmias, triggering CHF exacerbation.  She had atrial fibrillation ablation in 4/21.  She went into atrial flutter in 11/21, she had DCCV in 12/21, went back in atrial flutter on  amiodarone. I think that her QT interval has been too long for Tikosyn.  She had redo AF ablation in 3/22.  She is in NSR today.  - Amiodarone was stopped again with rise in LFTs.  Follow for now, ?due to HF or due to amiodarone, may rechallenge in future.   - Continue apixaban, she has not missed doses.  3. Hypothyroidism: Suspect related to prior amiodarone use.   - Continue Levoxyl.  4. Elevated LFTs: Mild elevation in past, resolved in 11/21.  Abdominal US showed unremarkable liver.  ?Related to amiodarone.  She is now  back on amiodarone and LFTs are increased again as above, ?due to amiodarone versus CHF.  - Stop amiodarone for now.  5. CAD: Calcification noted in coronaries with pulmonary vein CT done prior to afib ablation.  - Continue atorvastatin 40 daily, good lipids in 8/21.  - No ASA given apixaban use.  6. Mitral regurgitation: Severe MR on TEE in 3/22.  Suspect atrial enlargement has stretched MV annulus, has P2-P3 malcoaptation. Suspect primarily functional.  MR has clearly worsened, suspect it is helping to drive CHF.  She is not a candidate for surgical MV repair.  - Will have our structural heart team see her to determine candidacy for Mitraclip.  She just had AF ablation last week, so ideally would stay on anticoagulation without missing doses for at least a month.  7. Hyponatremia: Hypervolemic hyponatremia.  Na 123 => 126 with diuresis.  Still volume overloaded.  - Dose tolvaptan 15 mg x 1 today.  8. Fe deficiency: Low transferrin saturation, feraheme today.   [name] 07/20/2020 11:30 AM

## 2020-07-20 NOTE — Consult Note (Signed)
[facility]   MULTIDISCIPLINARY HEART VALVE TEAM  Date:  07/20/2020   ID:  Cynthia Frank, DOB Jul 18, 1935, MRN [id number]  PCP:  [name], MD   Chief Complaint  Patient presents with  . Weakness     HISTORY OF PRESENT ILLNESS: MARRISSA Frank is a 85 y.o. female who presents for evaluation of severe mitral regurgiation, referred by Dr Aundra Dubin.   She has a complex medical history including chronic systolic CHF/NICM felt to be due to Adriamycin use for treatment of ovarian cancer, s/p Medtronic CRT-D, mitral valve regurgitation, atrial tachycardia, afib/flutter on Eliquis with recent repeat afib ablation (07/15/20). Pre ablation TEE showed EF 20-25%, moderate LV dilation, moderately decreased RV systolic function, moderate TR, and severe MR with malcoaptation of P2/P3 scallops.    She was discharged home but then re-admitted on 07/18/20 with worsening dyspnea/volume overload. Structural heart team asked to see while admitted.  The patient lives in [place] with her husband. She has 2 daughters and several grandchildren in the area. She can walk without the aid of a walker or cane. She still drives and does some light housework. She is not very active and mostly just walks around the house and goes to play bridge. She has regular dental care and reports no active dental issues. She is vaccinated for Covid 19.   The patient is evaluated this afternoon in the hospital.  Her husband is at the bedside.  She admits to progressive weakness and shortness of breath with activity.  She does not have orthopnea or PND.  She was noted to have hyponatremia on admission and with diuresis her sodium has improved a little.  She is feeling better but continues to feel weak and fatigued.  Past Medical History:  Diagnosis Date  . AICD (automatic cardioverter/defibrillator) present   . Arthritis    "left ankle" (07/14/2015)  . Atrial fibrillation ([place])   . Cataract    bilateral  -not a surgical candidate  . CHF (congestive heart failure) ([place])   . Colon cancer ([place]) 03/2006   T3, N0  . Colon polyps 12/07/2010  . Coronary artery disease    nonobstructive with 30% D1  . Diabetes mellitus without complication ([place])    on meds  . GERD (gastroesophageal reflux disease)    OTC PRN -diet controlled  . History of ovarian cancer 1985  . Hx: UTI (urinary tract infection)   . Hyperlipidemia    on meds  . Hypertension    on meds  . Internal hemorrhoid   . Nonischemic cardiomyopathy ([place])    last EF assessment 40% by MUGA  . Osteoporosis    Prolia once per year  . Partial bowel obstruction ([place])   . PVC (premature ventricular contraction)   . Thyroid disease    on meds    Current Facility-Administered Medications  Medication Dose Route Frequency Provider Last Rate Last Admin  . 0.9 %  sodium chloride infusion  250 mL Intravenous PRN Marcie Mowers, MD      . acetaminophen (TYLENOL) tablet 500 mg  500 mg Oral Q6H PRN Marcie Mowers, MD   500 mg at 07/20/20 1457  . apixaban (ELIQUIS) tablet 2.5 mg  2.5 mg Oral BID Marcie Mowers, MD   2.5 mg at 07/20/20 0942  . atorvastatin (LIPITOR) tablet 40 mg  40 mg Oral Daily Marcie Mowers, MD   40 mg at 07/20/20 0942  . clobetasol cream (TEMOVATE) 4.74 % 1 application  1 application Topical BID PRN Carnicelli, Earnstine Regal, MD      . conjugated estrogens (PREMARIN) vaginal cream 1 Applicatorful  1 Applicatorful Vaginal Daily PRN Marcie Mowers, MD      . digoxin (LANOXIN) tablet 0.0625 mg  0.0625 mg Oral Daily Larey Dresser, MD   0.0625 mg at 07/20/20 1342  . furosemide (LASIX) injection 40 mg  40 mg Intravenous BID Larey Dresser, MD   40 mg at 07/20/20 1853  . ipratropium (ATROVENT) 0.06 % nasal spray 1 spray  1 spray Each Nare BID Marcie Mowers, MD   1 spray at 07/20/20 [phone]  . ketoconazole (NIZORAL) 2 % shampoo 1 application  1 application  Topical Daily PRN Carnicelli, Earnstine Regal, MD      . levothyroxine (SYNTHROID) tablet 25 mcg  25 mcg Oral QAC breakfast Marcie Mowers, MD   25 mcg at 07/20/20 [phone]  . magnesium oxide (MAG-OX) tablet 800 mg  800 mg Oral BID Marcie Mowers, MD   800 mg at 07/20/20 0942  . ondansetron (ZOFRAN) injection 4 mg  4 mg Intravenous Q6H PRN Carnicelli, Earnstine Regal, MD      . potassium chloride SA (KLOR-CON) CR tablet 20 mEq  20 mEq Oral Daily Marcie Mowers, MD   20 mEq at 07/20/20 0942  . sertraline (ZOLOFT) tablet 25 mg  25 mg Oral Daily Marcie Mowers, MD   25 mg at 07/20/20 0942  . sodium chloride flush (NS) 0.9 % injection 3 mL  3 mL Intravenous Q12H Carnicelli, Earnstine Regal, MD   3 mL at 07/20/20 0944  . sodium chloride flush (NS) 0.9 % injection 3 mL  3 mL Intravenous PRN Carnicelli, Earnstine Regal, MD      . spironolactone (ALDACTONE) tablet 25 mg  25 mg Oral QHS Marcie Mowers, MD   25 mg at 07/19/20 2125  . zolpidem (AMBIEN) tablet 5 mg  5 mg Oral QHS PRN Rudean Curt, MD   5 mg at 07/19/20 2124    ALLERGIES:   Clindamycin/lincomycin, Dramamine ii [meclizine hcl], Pneumococcal vaccines, and Sulfonamide derivatives   SOCIAL HISTORY:  The patient  reports that she quit smoking about 42 years ago. Her smoking use included cigarettes. She has a 10.00 pack-year smoking history. She has never used smokeless tobacco. She reports current alcohol use of about 14.0 standard drinks of alcohol per week. She reports that she does not use drugs.   FAMILY HISTORY:  The patient's family history includes Heart attack in her father; Heart disease in her father; Prostate cancer in her father; Uterine cancer in her mother.   REVIEW OF SYSTEMS:  Positive for weakness, leg pain from arthritis.   All other systems are reviewed and negative.   PHYSICAL EXAM: VS:  BP 93/70   Pulse 95   Temp 97.9 F (36.6 C) (Oral)   Resp 19   Ht _0   (1.626 m)   Wt 55.2 kg Comment: scale b  SpO2 100%   BMI 20.89 kg/m  , BMI Body mass index is 20.89 kg/m. GEN: Well nourished, well developed, in no acute distress HEENT: normal Neck: No JVD. carotids 2+ without bruits or masses Cardiac: The heart is RRR with a 2/6 holosystolic murmur at the apex no edema. Pedal pulses 2+ = bilaterally  Respiratory:  clear to auscultation bilaterally GI: soft, nontender, nondistended, + BS MS: no deformity or atrophy Skin: warm and dry, no rash Neuro:  Strength and sensation  are intact Psych: euthymic mood, full affect  EKG:  EKG from 07/18/2020 reviewed and demonstrates a sensed V paced rhythm  RECENT LABS: 07/18/2020: B Natriuretic Peptide 2,412.6; Hemoglobin 10.9; Magnesium 2.2; Platelets 147 07/19/2020: TSH 3.088 07/20/2020: ALT 113; BUN 24; Creatinine, Ser 1.36; Potassium 3.9; Sodium 125  12/12/2019: Cholesterol 150; HDL 86; LDL Cholesterol 52; Total CHOL/HDL Ratio 1.7; Triglycerides 60; VLDL 12   Estimated Creatinine Clearance: 27.5 mL/min (A) (by C-G formula based on SCr of 1.36 mg/dL (H)).   Wt Readings from Last 3 Encounters:  07/20/20 55.2 kg  07/15/20 57.2 kg  07/08/20 55.3 kg     CARDIAC STUDIES:  TEE: IMPRESSIONS    1. Severe Mitral Regurgitation with pulmonary vein flow reversal and ERO  43 mm2. Mechanism appears to be atrial function and malcoaptation of P2-P3  as largest contributor. Estimated MVA 4.80 cm PML length 1.35 mm without  significant MAC. The mitral  valve is grossly normal. . The mean mitral valve gradient is 1.0 mmHg with  average heart rate of 80 bpm.  2. Left atrial size was moderately dilated. No left atrial/left atrial  appendage thrombus was detected.  3. Left ventricular ejection fraction, by estimation, is 20 to 25%. The  left ventricle has severely decreased function. The left ventricle  demonstrates global hypokinesis. The left ventricular internal cavity size  was moderately to severely dilated.  4.  Right ventricular systolic function is moderately reduced. The right  ventricular size is normal.  5. Right atrial size was mild to moderately dilated.  6. Moderate and eccentric tricuspid regurgitation without evidence of  disastolic TR to favor device related.  7. The aortic valve is tricuspid. Aortic valve regurgitation is mild.  8. There is mild (Grade II) plaque involving the descending aorta.   Comparison(s): A prior study was performed on 08/14/19. Worsening of mitral  regurgitation and left ventricular ejection fraction.   STS RISK CALCULATOR: Procedure: Isolated MVR Risk of Mortality: 10.878% Renal Failure: 8.326% Permanent Stroke: 3.874% Prolonged Ventilation: 18.428% DSW Infection: 0.046% Reoperation: 9.090% Morbidity or Mortality: 31.501% Short Length of Stay: 8.201% Long Length of Stay: 19.879%  ______________________  Procedure: MV Repair Risk of Mortality: 8.430% Renal Failure: 6.381% Permanent Stroke: 0.966% Prolonged Ventilation: 17.807% DSW Infection: 0.024% Reoperation: 8.382% Morbidity or Mortality: 28.838% Short Length of Stay: 8.229% Long Length of Stay: 16.754%  ________________________  [facility] Cardiomyopathy Questionnaire  KCCQ-12 07/21/2020  1 a. Ability to shower/bathe Extremely limited  1 b. Ability to walk 1 block Moderately limited  1 c. Ability to hurry/jog Other, Did not do  2. Edema feet/ankles/legs Never over the past 2 weeks  3. Limited by fatigue Several times a day  4. Limited by dyspnea At least once a day  5. Sitting up / on 3+ pillows Never over the past 2 weeks  6. Limited enjoyment of life Moderately limited  7. Rest of life w/ symptoms Somewhat satisfied  8 a. Participation in hobbies Slightly limited  8 b. Participation in chores Moderately limited  8 c. Visiting family/friends Did not limit at all      ASSESSMENT AND PLAN:  85 year old woman with acute on chronic biventricular systolic heart  failure and worsening now severe stage D mitral regurgitation.  The patient has a complex history with longstanding cardiomyopathy related to remote Adriamycin administration for treatment of ovarian cancer.  She has undergone CRT-D and recently has undergone A. fib ablation.  The patient has chronic New York Heart Association functional class IIIb symptoms,  now with fluid overload and hyponatremia.  I have reviewed her transesophageal echo study which demonstrates severe LV systolic dysfunction with moderate LV dilatation.  There is severe mitral regurgitation with pulmonary vein flow reversal demonstrated and an ER O of 43 mm.  Suspect this is a combination of Carpentier type I and IIIb dysfunction with mall coaptation demonstrated along P2/P3.  The patient's estimated mitral valve area is 4.8 cm.  There is also moderate tricuspid regurgitation with pacing wires across the tricuspid valve.  I have reviewed the natural history of mitral regurgitation with the patient and their family members who are present today. We have discussed the limitations of medical therapy and the poor prognosis associated with symptomatic mitral regurgitation. We have also reviewed potential treatment options, including palliative medical therapy, conventional surgical mitral valve repair or replacement, and percutaneous mitral valve therapies such as edge-to-edge mitral valve approximation with MitraClip. We discussed treatment options in the context of this patient's specific comorbid medical conditions.  I have discussed the patient's case extensively with Dr. Algernon Huxley.  We have reviewed her imaging studies in our multidisciplinary heart valve team meeting this morning.  The patient is not a candidate for advanced heart failure therapies because of her age and comorbidities.  Transcatheter edge-to-edge repair of the mitral valve is a reasonable treatment option in this patient who medical therapy cannot be advanced because of low  blood pressure and intolerance.  I reviewed the specific details of the MitraClip procedure with the patient and her husband.  They understand that major procedural risks such as vascular injury, stroke, myocardial infarction, arrhythmia, device embolization, mitral leaflet damage, cardiac perforation, cardiac tamponade,  emergency cardiac surgery, or death, occur at a frequency of 1 to 2%.  After demonstrating full understanding of procedural risks and potential benefit, they provide full informed consent for the procedure.  Since she has undergone recent atrial fibrillation, will minimize her time off of anticoagulation.  I will hold her apixaban the night before and morning of the procedure, then resume apixaban the same day of the procedure.  We will tentatively plan to perform MitraClip on 07/23/2020.  Deatra James 07/20/2020 7:44 PM     [facility] [place]  [id number]  [phone] (office) [phone] (fax)

## 2020-07-20 NOTE — Progress Notes (Signed)
Progress Note  Patient Name: Cynthia Frank Date of Encounter: 07/20/2020  [facility] Cardiologist: No primary care provider on file.   Subjective   Continues to feel weak but improved from yesterday.   Blood pressures soft 80-90s this AM Wt 121.7lbs from 122.36; I/Os not recorded Na 123-->126  Inpatient Medications    Scheduled Meds: . apixaban  2.5 mg Oral BID  . atorvastatin  40 mg Oral Daily  . furosemide  40 mg Intravenous Once  . ipratropium  1 spray Each Nare BID  . levothyroxine  25 mcg Oral QAC breakfast  . magnesium oxide  800 mg Oral BID  . potassium chloride SA  20 mEq Oral Daily  . sertraline  25 mg Oral Daily  . sodium chloride flush  3 mL Intravenous Q12H  . spironolactone  25 mg Oral QHS   Continuous Infusions: . sodium chloride     PRN Meds: sodium chloride, acetaminophen, clobetasol cream, conjugated estrogens, ketoconazole, ondansetron (ZOFRAN) IV, sodium chloride flush, zolpidem   Vital Signs    Vitals:   07/19/20 1757 07/19/20 1922 07/20/20 0033 07/20/20 0440  BP: (!) 1_0 97/81  Pulse: 63 95 94 96  Resp: _1 Temp: 97.6 F (36.4 C) (!) 97.5 F (36.4 C) 98.3 F (36.8 C) 97.6 F (36.4 C)  TempSrc: Oral Oral Oral Oral  SpO2: 100% 95% 94% 97%  Weight:    55.2 kg  Height:        Intake/Output Summary (Last 24 hours) at 07/20/2020 0749 Last data filed at 07/19/2020 1929 Gross per 24 hour  Intake --  Output 300 ml  Net -300 ml   Last 3 Weights 07/20/2020 07/19/2020 07/18/2020  Weight (lbs) 121 lb 11.2 oz 122 lb 5.7 oz 126 lb  Weight (kg) 55.203 kg 55.5 kg 57.153 kg      Telemetry    A sensed V-paced; BiV paced - Personally Reviewed  ECG    No new tracing - Personally Reviewed  Physical Exam   GEN: Elderly female; sitting in a chair, comfortable Neck: Mild JVD Cardiac: RRR, 2/6 systolic murmur. No rubs or gallops  Respiratory: Clear to auscultation bilaterally. GI: Soft, nontender, non-distended  MS:  Trace LE edema with L slightly greater than right Neuro:  Nonfocal  Psych: Normal affect   Labs    High Sensitivity Troponin:   Recent Labs  Lab 07/18/20 2026 07/19/20 0035  TROPONINIHS 24* 25*      Chemistry Recent Labs  Lab 07/18/20 2026  NA 123*  K 3.7  CL 90*  CO2 24  GLUCOSE 111*  BUN 25*  CREATININE 1.46*  CALCIUM 7.9*  PROT 6.2*  ALBUMIN 2.8*  AST 123*  ALT 76*  ALKPHOS 102  BILITOT 1.4*  GFRNONAA 36*  ANIONGAP 9     Hematology Recent Labs  Lab 07/18/20 2026 07/18/20 2340  WBC 6.0  --   RBC 3.97 3.78*  HGB 10.9*  --   HCT 34.0*  --   MCV 85.6  --   MCH 27.5  --   MCHC 32.1  --   RDW 18.4*  --   PLT 147*  --     BNP Recent Labs  Lab 07/18/20 2026  BNP 2,412.6*     DDimer No results for input(s): DDIMER in the last 168 hours.   Radiology    DG Chest Portable 1 View  Result Date: 07/18/2020 CLINICAL DATA:  85 year old female with shortness of breath.  EXAM: PORTABLE CHEST 1 VIEW COMPARISON:  Chest radiograph dated 05/09/2019. FINDINGS: Left lung base linear atelectasis/scarring. No focal consolidation or pneumothorax. Trace left pleural effusion is suspected. There is cardiomegaly. Atherosclerotic calcification of the aorta. Left pectoral AICD device. No acute osseous pathology. IMPRESSION: 1. Trace left pleural effusion and left lung base atelectasis. 2. Cardiomegaly. Electronically Signed   By: Anner Crete M.D.   On: 07/18/2020 20:38    Cardiac Studies   TEE 07/15/20 1. Severe Mitral Regurgitation with pulmonary vein flow reversal and ERO  43 mm2. Mechanism appears to be atrial function and malcoaptation of P2-P3  as largest contributor. Estimated MVA 4.80 cm PML length 1.35 mm without  significant MAC. The mitral  valve is grossly normal. . The mean mitral valve gradient is 1.0 mmHg with  average heart rate of 80 bpm.  2. Left atrial size was moderately dilated. No left atrial/left atrial  appendage thrombus was detected.  3.  Left ventricular ejection fraction, by estimation, is 20 to 25%. The  left ventricle has severely decreased function. The left ventricle  demonstrates global hypokinesis. The left ventricular internal cavity size  was moderately to severely dilated.  4. Right ventricular systolic function is moderately reduced. The right  ventricular size is normal.  5. Right atrial size was mild to moderately dilated.  6. Moderate and eccentric tricuspid regurgitation without evidence of  disastolic TR to favor device related.  7. The aortic valve is tricuspid. Aortic valve regurgitation is mild.  8. There is mild (Grade II) plaque involving the descending aorta.   Comparison(s): A prior study was performed on 08/14/19. Worsening of mitral  regurgitation and left ventricular ejection fraction.   Patient Profile     85 y.o. female of nonischemic cardiomyopathy thought to be secondary to adriamycin, history of ovarian and colon cancer, DMII, HTN, HLD, hypothyroidism and afib s/p recent ablation on 07/15/20 who presented with lightheadedness, weakness and hyponatremia with evidence of mild volume overload with BNP 2412 now improving with diuresis  Assessment & Plan    #Acute on chronic HFrEF: #Non-ischemic cardiomyopathy: #Hyponatremia: Patient presented with worsening weakness and lightheadedness with soft blood pressures on admission. BNP elevated 2416. Na 123. Received lasix 80m IV with improvement of Na to 126 today.  Blood pressure remains soft; off home medications. Will consult HF team. -Consult HF team--will defer further diuresis to them -Give dose of tolvaptan now per discussion with Dr. MMarigene Ehlers-Holding metop due to soft blood pressures -Intolerant to entresto due to syncope and lightheadedness -Continue spiro 237mdaily -Farxiga held on admission; resume when able per HF team -S/p medtronic ICD placement  #Severe MR: TEE with ERO 4360mwith pulmonary vein flow reversal noted and  malcoaptation of the P2-P3 scallops. Followed by HF team. -Management per HF team -Diuresis as above  #Afib s/p ablation: Patient is s/p ablation on 07/15/20. Currently BiV paced. -Holding amiodarone due to LFT elevation -Continue eliquis  #DMII: -Resume farxiga prior to discharge  #Mild non-obstructive CAD: -Continue lipitor -No ASA given need for apixaban     For questions or updates, please contact [email or web address] Please consult [email or web address] for contact info under        Signed, HeaFreada BergeronD  07/20/2020, 7:49 AM

## 2020-07-20 NOTE — H&P (View-Only) (Signed)
[facility]   MULTIDISCIPLINARY HEART VALVE TEAM  Date:  07/20/2020   ID:  [name], DOB [birth year], MRN [id number]  PCP:  [name], MD   Chief Complaint  Patient presents with  . Weakness     HISTORY OF PRESENT ILLNESS: Cynthia Frank is a 85 y.o. female who presents for evaluation of severe mitral regurgiation, referred by Dr Aundra Dubin.   She has a complex medical history including chronic systolic CHF/NICM felt to be due to Adriamycin use for treatment of ovarian cancer, s/p Medtronic CRT-D, mitral valve regurgitation, atrial tachycardia, afib/flutter on Eliquis with recent repeat afib ablation (07/15/20). Pre ablation TEE showed EF 20-25%, moderate LV dilation, moderately decreased RV systolic function, moderate TR, and severe MR with malcoaptation of P2/P3 scallops.    She was discharged home but then re-admitted on 07/18/20 with worsening dyspnea/volume overload. Structural heart team asked to see while admitted.  The patient lives in [place] with her husband. She has 2 daughters and several grandchildren in the area. She can walk without the aid of a walker or cane. She still drives and does some light housework. She is not very active and mostly just walks around the house and goes to play bridge. She has regular dental care and reports no active dental issues. She is vaccinated for Covid 19.   The patient is evaluated this afternoon in the hospital.  Her husband is at the bedside.  She admits to progressive weakness and shortness of breath with activity.  She does not have orthopnea or PND.  She was noted to have hyponatremia on admission and with diuresis her sodium has improved a little.  She is feeling better but continues to feel weak and fatigued.  Past Medical History:  Diagnosis Date  . AICD (automatic cardioverter/defibrillator) present   . Arthritis    "left ankle" (07/14/2015)  . Atrial fibrillation ([place])   . Cataract    bilateral  -not a surgical candidate  . CHF (congestive heart failure) ([place])   . Colon cancer ([place]) 03/2006   T3, N0  . Colon polyps 12/07/2010  . Coronary artery disease    nonobstructive with 30% D1  . Diabetes mellitus without complication ([place])    on meds  . GERD (gastroesophageal reflux disease)    OTC PRN -diet controlled  . History of ovarian cancer 1985  . Hx: UTI (urinary tract infection)   . Hyperlipidemia    on meds  . Hypertension    on meds  . Internal hemorrhoid   . Nonischemic cardiomyopathy ([place])    last EF assessment 40% by MUGA  . Osteoporosis    Prolia once per year  . Partial bowel obstruction ([place])   . PVC (premature ventricular contraction)   . Thyroid disease    on meds    Current Facility-Administered Medications  Medication Dose Route Frequency Provider Last Rate Last Admin  . 0.9 %  sodium chloride infusion  250 mL Intravenous PRN Marcie Mowers, MD      . acetaminophen (TYLENOL) tablet 500 mg  500 mg Oral Q6H PRN Marcie Mowers, MD   500 mg at 07/20/20 1457  . apixaban (ELIQUIS) tablet 2.5 mg  2.5 mg Oral BID Marcie Mowers, MD   2.5 mg at 07/20/20 0942  . atorvastatin (LIPITOR) tablet 40 mg  40 mg Oral Daily Marcie Mowers, MD   40 mg at 07/20/20 0942  . clobetasol cream (TEMOVATE) 3.53 % 1 application  1 application Topical BID PRN Carnicelli, Earnstine Regal, MD      . conjugated estrogens (PREMARIN) vaginal cream 1 Applicatorful  1 Applicatorful Vaginal Daily PRN Marcie Mowers, MD      . digoxin (LANOXIN) tablet 0.0625 mg  0.0625 mg Oral Daily Larey Dresser, MD   0.0625 mg at 07/20/20 1342  . furosemide (LASIX) injection 40 mg  40 mg Intravenous BID Larey Dresser, MD   40 mg at 07/20/20 1853  . ipratropium (ATROVENT) 0.06 % nasal spray 1 spray  1 spray Each Nare BID Marcie Mowers, MD   1 spray at 07/20/20 [phone]  . ketoconazole (NIZORAL) 2 % shampoo 1 application  1 application  Topical Daily PRN Carnicelli, Earnstine Regal, MD      . levothyroxine (SYNTHROID) tablet 25 mcg  25 mcg Oral QAC breakfast Marcie Mowers, MD   25 mcg at 07/20/20 [phone]  . magnesium oxide (MAG-OX) tablet 800 mg  800 mg Oral BID Marcie Mowers, MD   800 mg at 07/20/20 0942  . ondansetron (ZOFRAN) injection 4 mg  4 mg Intravenous Q6H PRN Carnicelli, Earnstine Regal, MD      . potassium chloride SA (KLOR-CON) CR tablet 20 mEq  20 mEq Oral Daily Marcie Mowers, MD   20 mEq at 07/20/20 0942  . sertraline (ZOLOFT) tablet 25 mg  25 mg Oral Daily Marcie Mowers, MD   25 mg at 07/20/20 0942  . sodium chloride flush (NS) 0.9 % injection 3 mL  3 mL Intravenous Q12H Carnicelli, Earnstine Regal, MD   3 mL at 07/20/20 0944  . sodium chloride flush (NS) 0.9 % injection 3 mL  3 mL Intravenous PRN Carnicelli, Earnstine Regal, MD      . spironolactone (ALDACTONE) tablet 25 mg  25 mg Oral QHS Marcie Mowers, MD   25 mg at 07/19/20 2125  . zolpidem (AMBIEN) tablet 5 mg  5 mg Oral QHS PRN Rudean Curt, MD   5 mg at 07/19/20 2124    ALLERGIES:   Clindamycin/lincomycin, Dramamine ii [meclizine hcl], Pneumococcal vaccines, and Sulfonamide derivatives   SOCIAL HISTORY:  The patient  reports that she quit smoking about 42 years ago. Her smoking use included cigarettes. She has a 10.00 pack-year smoking history. She has never used smokeless tobacco. She reports current alcohol use of about 14.0 standard drinks of alcohol per week. She reports that she does not use drugs.   FAMILY HISTORY:  The patient's family history includes Heart attack in her father; Heart disease in her father; Prostate cancer in her father; Uterine cancer in her mother.   REVIEW OF SYSTEMS:  Positive for weakness, leg pain from arthritis.   All other systems are reviewed and negative.   PHYSICAL EXAM: VS:  BP 93/70   Pulse 95   Temp 97.9 F (36.6 C) (Oral)   Resp 19   Ht _0   (1.626 m)   Wt 55.2 kg Comment: scale b  SpO2 100%   BMI 20.89 kg/m  , BMI Body mass index is 20.89 kg/m. GEN: Well nourished, well developed, in no acute distress HEENT: normal Neck: No JVD. carotids 2+ without bruits or masses Cardiac: The heart is RRR with a 2/6 holosystolic murmur at the apex no edema. Pedal pulses 2+ = bilaterally  Respiratory:  clear to auscultation bilaterally GI: soft, nontender, nondistended, + BS MS: no deformity or atrophy Skin: warm and dry, no rash Neuro:  Strength and sensation  are intact Psych: euthymic mood, full affect  EKG:  EKG from 07/18/2020 reviewed and demonstrates a sensed V paced rhythm  RECENT LABS: 07/18/2020: B Natriuretic Peptide 2,412.6; Hemoglobin 10.9; Magnesium 2.2; Platelets 147 07/19/2020: TSH 3.088 07/20/2020: ALT 113; BUN 24; Creatinine, Ser 1.36; Potassium 3.9; Sodium 125  12/12/2019: Cholesterol 150; HDL 86; LDL Cholesterol 52; Total CHOL/HDL Ratio 1.7; Triglycerides 60; VLDL 12   Estimated Creatinine Clearance: 27.5 mL/min (A) (by C-G formula based on SCr of 1.36 mg/dL (H)).   Wt Readings from Last 3 Encounters:  07/20/20 55.2 kg  07/15/20 57.2 kg  07/08/20 55.3 kg     CARDIAC STUDIES:  TEE: IMPRESSIONS    1. Severe Mitral Regurgitation with pulmonary vein flow reversal and ERO  43 mm2. Mechanism appears to be atrial function and malcoaptation of P2-P3  as largest contributor. Estimated MVA 4.80 cm PML length 1.35 mm without  significant MAC. The mitral  valve is grossly normal. . The mean mitral valve gradient is 1.0 mmHg with  average heart rate of 80 bpm.  2. Left atrial size was moderately dilated. No left atrial/left atrial  appendage thrombus was detected.  3. Left ventricular ejection fraction, by estimation, is 20 to 25%. The  left ventricle has severely decreased function. The left ventricle  demonstrates global hypokinesis. The left ventricular internal cavity size  was moderately to severely dilated.  4.  Right ventricular systolic function is moderately reduced. The right  ventricular size is normal.  5. Right atrial size was mild to moderately dilated.  6. Moderate and eccentric tricuspid regurgitation without evidence of  disastolic TR to favor device related.  7. The aortic valve is tricuspid. Aortic valve regurgitation is mild.  8. There is mild (Grade II) plaque involving the descending aorta.   Comparison(s): A prior study was performed on 08/14/19. Worsening of mitral  regurgitation and left ventricular ejection fraction.   STS RISK CALCULATOR: Procedure: Isolated MVR Risk of Mortality: 10.878% Renal Failure: 8.326% Permanent Stroke: 3.874% Prolonged Ventilation: 18.428% DSW Infection: 0.046% Reoperation: 9.090% Morbidity or Mortality: 31.501% Short Length of Stay: 8.201% Long Length of Stay: 19.879%  ______________________  Procedure: MV Repair Risk of Mortality: 8.430% Renal Failure: 6.381% Permanent Stroke: 0.966% Prolonged Ventilation: 17.807% DSW Infection: 0.024% Reoperation: 8.382% Morbidity or Mortality: 28.838% Short Length of Stay: 8.229% Long Length of Stay: 16.754%  ________________________  [facility] Cardiomyopathy Questionnaire  KCCQ-12 07/21/2020  1 a. Ability to shower/bathe Extremely limited  1 b. Ability to walk 1 block Moderately limited  1 c. Ability to hurry/jog Other, Did not do  2. Edema feet/ankles/legs Never over the past 2 weeks  3. Limited by fatigue Several times a day  4. Limited by dyspnea At least once a day  5. Sitting up / on 3+ pillows Never over the past 2 weeks  6. Limited enjoyment of life Moderately limited  7. Rest of life w/ symptoms Somewhat satisfied  8 a. Participation in hobbies Slightly limited  8 b. Participation in chores Moderately limited  8 c. Visiting family/friends Did not limit at all      ASSESSMENT AND PLAN:  85 year old woman with acute on chronic biventricular systolic heart  failure and worsening now severe stage D mitral regurgitation.  The patient has a complex history with longstanding cardiomyopathy related to remote Adriamycin administration for treatment of ovarian cancer.  She has undergone CRT-D and recently has undergone A. fib ablation.  The patient has chronic New York Heart Association functional class IIIb symptoms,  now with fluid overload and hyponatremia.  I have reviewed her transesophageal echo study which demonstrates severe LV systolic dysfunction with moderate LV dilatation.  There is severe mitral regurgitation with pulmonary vein flow reversal demonstrated and an ER O of 43 mm.  Suspect this is a combination of Carpentier type I and IIIb dysfunction with mall coaptation demonstrated along P2/P3.  The patient's estimated mitral valve area is 4.8 cm.  There is also moderate tricuspid regurgitation with pacing wires across the tricuspid valve.  I have reviewed the natural history of mitral regurgitation with the patient and their family members who are present today. We have discussed the limitations of medical therapy and the poor prognosis associated with symptomatic mitral regurgitation. We have also reviewed potential treatment options, including palliative medical therapy, conventional surgical mitral valve repair or replacement, and percutaneous mitral valve therapies such as edge-to-edge mitral valve approximation with MitraClip. We discussed treatment options in the context of this patient's specific comorbid medical conditions.  I have discussed the patient's case extensively with Dr. Algernon Huxley.  We have reviewed her imaging studies in our multidisciplinary heart valve team meeting this morning.  The patient is not a candidate for advanced heart failure therapies because of her age and comorbidities.  Transcatheter edge-to-edge repair of the mitral valve is a reasonable treatment option in this patient who medical therapy cannot be advanced because of low  blood pressure and intolerance.  I reviewed the specific details of the MitraClip procedure with the patient and her husband.  They understand that major procedural risks such as vascular injury, stroke, myocardial infarction, arrhythmia, device embolization, mitral leaflet damage, cardiac perforation, cardiac tamponade,  emergency cardiac surgery, or death, occur at a frequency of 1 to 2%.  After demonstrating full understanding of procedural risks and potential benefit, they provide full informed consent for the procedure.  Since she has undergone recent atrial fibrillation, will minimize her time off of anticoagulation.  I will hold her apixaban the night before and morning of the procedure, then resume apixaban the same day of the procedure.  We will tentatively plan to perform MitraClip on 07/23/2020.  Deatra James 07/20/2020 7:44 PM     [facility] [place] [address]  [phone] (office) [phone] (fax)

## 2020-07-21 ENCOUNTER — Other Ambulatory Visit: Payer: Self-pay

## 2020-07-21 DIAGNOSIS — Z006 Encounter for examination for normal comparison and control in clinical research program: Secondary | ICD-10-CM | POA: Diagnosis not present

## 2020-07-21 DIAGNOSIS — Z20822 Contact with and (suspected) exposure to covid-19: Secondary | ICD-10-CM | POA: Diagnosis not present

## 2020-07-21 DIAGNOSIS — I081 Rheumatic disorders of both mitral and tricuspid valves: Secondary | ICD-10-CM | POA: Diagnosis not present

## 2020-07-21 DIAGNOSIS — I340 Nonrheumatic mitral (valve) insufficiency: Secondary | ICD-10-CM

## 2020-07-21 DIAGNOSIS — I5023 Acute on chronic systolic (congestive) heart failure: Secondary | ICD-10-CM | POA: Diagnosis not present

## 2020-07-21 DIAGNOSIS — I5043 Acute on chronic combined systolic (congestive) and diastolic (congestive) heart failure: Secondary | ICD-10-CM | POA: Diagnosis not present

## 2020-07-21 LAB — COMPREHENSIVE METABOLIC PANEL
ALT: 109 U/L — ABNORMAL HIGH (ref 0–44)
AST: 138 U/L — ABNORMAL HIGH (ref 15–41)
Albumin: 2.7 g/dL — ABNORMAL LOW (ref 3.5–5.0)
Alkaline Phosphatase: 92 U/L (ref 38–126)
Anion gap: 9 (ref 5–15)
BUN: 20 mg/dL (ref 8–23)
CO2: 23 mmol/L (ref 22–32)
Calcium: 8.4 mg/dL — ABNORMAL LOW (ref 8.9–10.3)
Chloride: 95 mmol/L — ABNORMAL LOW (ref 98–111)
Creatinine, Ser: 1.24 mg/dL — ABNORMAL HIGH (ref 0.44–1.00)
GFR, Estimated: 43 mL/min — ABNORMAL LOW (ref >60)
Glucose, Bld: 91 mg/dL (ref 70–99)
Potassium: 3.8 mmol/L (ref 3.5–5.1)
Sodium: 127 mmol/L — ABNORMAL LOW (ref 135–145)
Total Bilirubin: 2 mg/dL — ABNORMAL HIGH (ref 0.3–1.2)
Total Protein: 5.8 g/dL — ABNORMAL LOW (ref 6.5–8.1)

## 2020-07-21 LAB — CBC
HCT: 31.7 % — ABNORMAL LOW (ref 36.0–46.0)
Hemoglobin: 10.7 g/dL — ABNORMAL LOW (ref 12.0–15.0)
MCH: 28.4 pg (ref 26.0–34.0)
MCHC: 33.8 g/dL (ref 30.0–36.0)
MCV: 84.1 fL (ref 80.0–100.0)
Platelets: 144 10*3/uL — ABNORMAL LOW (ref 150–400)
RBC: 3.77 MIL/uL — ABNORMAL LOW (ref 3.87–5.11)
RDW: 18.5 % — ABNORMAL HIGH (ref 11.5–15.5)
WBC: 5.8 10*3/uL (ref 4.0–10.5)
nRBC: 0.3 % — ABNORMAL HIGH (ref 0.0–0.2)

## 2020-07-21 MED ORDER — APIXABAN 2.5 MG PO TABS
2.5000 mg | ORAL_TABLET | Freq: Two times a day (BID) | ORAL | Status: DC
  Administered 2020-07-21: 2.5 mg via ORAL
  Filled 2020-07-21: qty 1

## 2020-07-21 MED ORDER — SODIUM CHLORIDE 0.9% FLUSH
3.0000 mL | Freq: Two times a day (BID) | INTRAVENOUS | Status: DC
  Administered 2020-07-21 – 2020-07-23 (×4): 3 mL via INTRAVENOUS

## 2020-07-21 MED ORDER — SODIUM CHLORIDE 0.9 % IV SOLN: INTRAVENOUS | Status: DC

## 2020-07-21 MED ORDER — SODIUM CHLORIDE 0.9 % IV SOLN: 250.0000 mL | INTRAVENOUS | Status: DC | PRN

## 2020-07-21 MED ORDER — SODIUM CHLORIDE 0.9% FLUSH: 3.0000 mL | INTRAVENOUS | Status: DC | PRN

## 2020-07-21 MED ORDER — ASPIRIN 81 MG PO CHEW: 81.0000 mg | CHEWABLE_TABLET | ORAL | Status: DC

## 2020-07-21 NOTE — Progress Notes (Addendum)
Advanced Heart Failure Rounding Note  PCP-Cardiologist: Dr. Aundra Dubin  EP: Dr. Curt Bears   Subjective:    Good UOP w/ IV Lasix, - 2.9L out yesterday, though wt only down 1 lb.  SCr stable at 1.2. K 3.8   Na continues to trend up slowly 123>>125>127. Received dose of Tolvaptan yesterday.   SBPs remain soft in 90s.   Feels better today. Sitting up in bed. Finished breakfast. No current dyspnea.    Objective:   Weight Range: 54.7 kg Body mass index is 20.72 kg/m.   Vital Signs:   Temp:  [97.2 F (36.2 C)-97.9 F (36.6 C)] 97.6 F (36.4 C) (04/05 0510) Pulse Rate:  [93-95] 95 (04/05 0510) Resp:  [18-19] 18 (04/05 0510) BP: (86-99)/(65-76) 99/74 (04/05 0510) SpO2:  [96 %-100 %] 100 % (04/05 0510) Weight:  [54.7 kg] 54.7 kg (04/05 0510) Last BM Date: 07/20/20  Weight change: Filed Weights   07/19/20 1427 07/20/20 0440 07/21/20 0510  Weight: 55.5 kg 55.2 kg 54.7 kg    Intake/Output:   Intake/Output Summary (Last 24 hours) at 07/21/2020 0807 Last data filed at 07/21/2020 0513 Gross per 24 hour  Intake 240 ml  Output 2900 ml  Net -2660 ml      Physical Exam    General:  Well appearing, thin elderly WF. No resp difficulty HEENT: Normal Neck: Supple. JVP 10 cm . Carotids 2+ bilat; no bruits. No lymphadenopathy or thyromegaly appreciated. Cor: PMI nondisplaced. Regular rate & rhythm. 3/6 MR murmur at apex  Lungs: bibasilar crackles, R>L  Abdomen: Soft, nontender, nondistended. No hepatosplenomegaly. No bruits or masses. Good bowel sounds. Extremities: No cyanosis, clubbing, rash, edema Neuro: Alert & orientedx3, cranial nerves grossly intact. moves all 4 extremities w/o difficulty. Affect pleasant   Telemetry   Paced 80s   EKG    No new EKG to review   Labs    CBC Recent Labs    07/18/20 2026 07/21/20 0105  WBC 6.0 5.8  NEUTROABS 4.8  --   HGB 10.9* 10.7*  HCT 34.0* 31.7*  MCV 85.6 84.1  PLT 147* 841*   Basic Metabolic Panel Recent Labs     07/18/20 2026 07/19/20 0035 07/20/20 0701 07/20/20 1018 07/20/20 1801 07/21/20 0105  NA 123*  --    < > 125* 125* 127*  K 3.7  --    < > 3.9  --  3.8  CL 90*  --    < > 91*  --  95*  CO2 24  --    < > 22  --  23  GLUCOSE 111*  --    < > 171*  --  91  BUN 25*  --    < > 24*  --  20  CREATININE 1.46*  --    < > 1.36*  --  1.24*  CALCIUM 7.9*  --    < > 8.2*  --  8.4*  MG 2.2  --   --   --   --   --   PHOS  --  2.5  --   --   --   --    < > = values in this interval not displayed.   Liver Function Tests Recent Labs    07/20/20 1018 07/21/20 0105  AST 153* 138*  ALT 113* 109*  ALKPHOS 92 92  BILITOT 1.7* 2.0*  PROT 6.1* 5.8*  ALBUMIN 2.7* 2.7*   No results for input(s): LIPASE, AMYLASE in the last 72  hours. Cardiac Enzymes Recent Labs    07/19/20 0035  CKTOTAL 219    BNP: BNP (last 3 results) Recent Labs    07/18/20 2026  BNP 2,412.6*    ProBNP (last 3 results) No results for input(s): PROBNP in the last 8760 hours.   D-Dimer No results for input(s): DDIMER in the last 72 hours. Hemoglobin A1C No results for input(s): HGBA1C in the last 72 hours. Fasting Lipid Panel No results for input(s): CHOL, HDL, LDLCALC, TRIG, CHOLHDL, LDLDIRECT in the last 72 hours. Thyroid Function Tests Recent Labs    07/19/20 0700  TSH 3.088    Other results:   Imaging     No results found.   Medications:     Scheduled Medications: . apixaban  2.5 mg Oral BID  . atorvastatin  40 mg Oral Daily  . digoxin  0.0625 mg Oral Daily  . furosemide  40 mg Intravenous BID  . ipratropium  1 spray Each Nare BID  . levothyroxine  25 mcg Oral QAC breakfast  . magnesium oxide  800 mg Oral BID  . potassium chloride SA  20 mEq Oral Daily  . sertraline  25 mg Oral Daily  . sodium chloride flush  3 mL Intravenous Q12H  . spironolactone  25 mg Oral QHS     Infusions: . sodium chloride       PRN Medications:  sodium chloride, acetaminophen, clobetasol cream,  conjugated estrogens, ketoconazole, ondansetron (ZOFRAN) IV, sodium chloride flush, zolpidem    Assessment/Plan   1. Acute on chronic systolic CHF: EF 99-35% with diffuse hypokinesis on 12/16 echo. Nonischemic cardiomyopathy, probably related to Adriamycin with ovarian cancer treatment. Cardiac MRI in 1/17 showed EF 29% with normal RV and septal-lateral dyssynchrony. There was a non-cardiac pattern of LGE in the septum, cannot rule out prior myocarditis based on this pattern. She has a Medtronic CRT-D device. Echo 4/19 with EF 30-35%, then echo in 8/20 showed EF up to 45-50%. TEE in 3/21 showed EF 30-35%. Echo in 11/21 showed EF 25-30% with mild LV dilation, global hypokinesis with septal-lateral dyssynchrony, moderate MR, moderately decreased RV systolic function with normal RV size, IVC normal.  TEE in 3/22 showed EF 20-25%, moderate LV dilation, moderately decreased RV systolic function, moderate TR, and severe MR with malcoaptation of P2/P3 scallops.  She was admitted with worsening dyspnea/volume overload.  She has been in NSR while here. SBP generally 90s, this is where she runs at home. Remains fluid overloaded on exam today.   - Continue to hold Toprol XL while markedly volume overloaded and with soft BP.  - Continue spironolactone 25 mg daily. - She will stay off Entresto given syncope and lightheadedness while taking it.Will not start losartan with soft BP. -C/w Lasix 40 mg IV bid.  - Hold Farxiga for now.  - Continue digoxin, low dose 0.0625 to start with given CKD 3 and small size.  - Severe MR has worsened HF, will need evaluation for Mitraclip (see below).  - RHC probably Wednesday after further diuresis.  Not candidate for advanced therapies with age.  2. Atrial arrhythmias: She needs to maintain NSR as she loses BiV pacing when she goes into atrial arrhythmias, triggering CHF exacerbation. She had atrial fibrillation ablation in 4/21. She went into atrial flutter in  11/21, she had DCCV in 12/21, went back in atrial flutter on amiodarone.I think that her QT interval has been too long for Tikosyn. She had redo AF ablation in 3/22.  She is in  NSR today.  -Amiodarone was stopped again with rise in LFTs.  Follow for now, ?due to HF or due to amiodarone, may rechallenge in future.  - Continue apixaban, she has not missed doses.  3. Hypothyroidism: Suspect related to prior amiodarone use.  - Continue Levoxyl.  4. Elevated LFTs: Mild elevation in past, resolved in 11/21. Abdominal US showed unremarkable liver. ?Related to amiodarone. She is now back on amiodarone and LFTs are increased again as above, ?due to amiodarone versus CHF. -Stop amiodarone for now.  5. CAD: Calcification noted in coronaries with pulmonary vein CT done prior to afib ablation.  - Continue atorvastatin 40 daily, good lipids in 8/21.  - No ASA given apixaban use.  6. Mitral regurgitation: Severe MR on TEE in 3/22.  Suspect atrial enlargement has stretched MV annulus, has P2-P3 malcoaptation. Suspect primarily functional.  MR has clearly worsened, suspect it is helping to drive CHF.  She is not a candidate for surgical MV repair.  - Will have our structural heart team see her to determine candidacy for Mitraclip.  She just had AF ablation last week, so ideally would stay on anticoagulation without missing doses for at least a month.  7. Hyponatremia: Hypervolemic hyponatremia.  Na 123 => 126 with diuresis =>127 after dose of Tolvaptan. Remains fluid overloaded.  - continue diuresis w/ IV Lasix  8. Fe deficiency: Low transferrin saturation - received feraheme 4/4   Length of Stay: [address] PA-C  07/21/2020, 8:07 AM  Advanced Heart Failure Team Pager [phone] (M-F; 7a - 5p)  Please contact [facility] for night-coverage after hours (5p -7a ) and weekends on [email or web address]  Patient seen with PA, agree with the above note.    Good diuresis yesterday, weight down.   Creatinine stable 1.24.  She had tolvaptan again yesterday with Na up to 127.  SBP stable in 90s.   General: NAD Neck: JVP 10 cm, no thyromegaly or thyroid nodule.  Lungs: Clear to auscultation bilaterally with normal respiratory effort. CV: Lateral PMI.  Heart regular S1/S2, no S3/S4, 2/6 HSM apex.  No peripheral edema.   Abdomen: Soft, nontender, no hepatosplenomegaly, no distention.  Skin: Intact without lesions or rashes.  Neurologic: Alert and oriented x 3.  Psych: Normal affect. Extremities: No clubbing or cyanosis.  HEENT: Normal.   Good diuresis yesterday with stable creatinine.  Continue Lasix 40 mg IV bid today.  She has been started on low dose digoxin.  No BP room right now to titrate meds.  Will plan RHC tomorrow to assess filling pressures and cardiac output, discussed risks/benefits with patient and she agrees to procedure.    Na rising to 127, will not dose tolvaptan again.  Keep po fluid restricted.   She remains in NSR post-AF ablation last week. She is off amiodarone with recurrent rise in LFTs.  She is on apixaban.   Worsening HF symptoms over time.  Options for management are limited, not candidate for advanced therapies based on age.  Low BP limits med titration.  Severe MR appears to play a role, Dr. Burt Knack to see today to discuss Mitraclip.  If decision is made to proceed, will need discussion about peri-procedural anticoagulation as she had a recent AF ablation.   [name] 07/21/2020 1:40 PM

## 2020-07-21 NOTE — H&P (View-Only) (Signed)
Advanced Heart Failure Rounding Note  PCP-Cardiologist: Dr. Aundra Dubin  EP: Dr. Curt Bears   Subjective:    Good UOP w/ IV Lasix, - 2.9L out yesterday, though wt only down 1 lb.  SCr stable at 1.2. K 3.8   Na continues to trend up slowly 123>>125>127. Received dose of Tolvaptan yesterday.   SBPs remain soft in 90s.   Feels better today. Sitting up in bed. Finished breakfast. No current dyspnea.    Objective:   Weight Range: 54.7 kg Body mass index is 20.72 kg/m.   Vital Signs:   Temp:  [97.2 F (36.2 C)-97.9 F (36.6 C)] 97.6 F (36.4 C) (04/05 0510) Pulse Rate:  [93-95] 95 (04/05 0510) Resp:  [18-19] 18 (04/05 0510) BP: (86-99)/(65-76) 99/74 (04/05 0510) SpO2:  [96 %-100 %] 100 % (04/05 0510) Weight:  [54.7 kg] 54.7 kg (04/05 0510) Last BM Date: 07/20/20  Weight change: Filed Weights   07/19/20 1427 07/20/20 0440 07/21/20 0510  Weight: 55.5 kg 55.2 kg 54.7 kg    Intake/Output:   Intake/Output Summary (Last 24 hours) at 07/21/2020 0807 Last data filed at 07/21/2020 0513 Gross per 24 hour  Intake 240 ml  Output 2900 ml  Net -2660 ml      Physical Exam    General:  Well appearing, thin elderly WF. No resp difficulty HEENT: Normal Neck: Supple. JVP 10 cm . Carotids 2+ bilat; no bruits. No lymphadenopathy or thyromegaly appreciated. Cor: PMI nondisplaced. Regular rate & rhythm. 3/6 MR murmur at apex  Lungs: bibasilar crackles, R>L  Abdomen: Soft, nontender, nondistended. No hepatosplenomegaly. No bruits or masses. Good bowel sounds. Extremities: No cyanosis, clubbing, rash, edema Neuro: Alert & orientedx3, cranial nerves grossly intact. moves all 4 extremities w/o difficulty. Affect pleasant   Telemetry   Paced 80s   EKG    No new EKG to review   Labs    CBC Recent Labs    07/18/20 2026 07/21/20 0105  WBC 6.0 5.8  NEUTROABS 4.8  --   HGB 10.9* 10.7*  HCT 34.0* 31.7*  MCV 85.6 84.1  PLT 147* 093*   Basic Metabolic Panel Recent Labs     07/18/20 2026 07/19/20 0035 07/20/20 0701 07/20/20 1018 07/20/20 1801 07/21/20 0105  NA 123*  --    < > 125* 125* 127*  K 3.7  --    < > 3.9  --  3.8  CL 90*  --    < > 91*  --  95*  CO2 24  --    < > 22  --  23  GLUCOSE 111*  --    < > 171*  --  91  BUN 25*  --    < > 24*  --  20  CREATININE 1.46*  --    < > 1.36*  --  1.24*  CALCIUM 7.9*  --    < > 8.2*  --  8.4*  MG 2.2  --   --   --   --   --   PHOS  --  2.5  --   --   --   --    < > = values in this interval not displayed.   Liver Function Tests Recent Labs    07/20/20 1018 07/21/20 0105  AST 153* 138*  ALT 113* 109*  ALKPHOS 92 92  BILITOT 1.7* 2.0*  PROT 6.1* 5.8*  ALBUMIN 2.7* 2.7*   No results for input(s): LIPASE, AMYLASE in the last 72  hours. Cardiac Enzymes Recent Labs    07/19/20 0035  CKTOTAL 219    BNP: BNP (last 3 results) Recent Labs    07/18/20 2026  BNP 2,412.6*    ProBNP (last 3 results) No results for input(s): PROBNP in the last 8760 hours.   D-Dimer No results for input(s): DDIMER in the last 72 hours. Hemoglobin A1C No results for input(s): HGBA1C in the last 72 hours. Fasting Lipid Panel No results for input(s): CHOL, HDL, LDLCALC, TRIG, CHOLHDL, LDLDIRECT in the last 72 hours. Thyroid Function Tests Recent Labs    07/19/20 0700  TSH 3.088    Other results:   Imaging     No results found.   Medications:     Scheduled Medications: . apixaban  2.5 mg Oral BID  . atorvastatin  40 mg Oral Daily  . digoxin  0.0625 mg Oral Daily  . furosemide  40 mg Intravenous BID  . ipratropium  1 spray Each Nare BID  . levothyroxine  25 mcg Oral QAC breakfast  . magnesium oxide  800 mg Oral BID  . potassium chloride SA  20 mEq Oral Daily  . sertraline  25 mg Oral Daily  . sodium chloride flush  3 mL Intravenous Q12H  . spironolactone  25 mg Oral QHS     Infusions: . sodium chloride       PRN Medications:  sodium chloride, acetaminophen, clobetasol cream,  conjugated estrogens, ketoconazole, ondansetron (ZOFRAN) IV, sodium chloride flush, zolpidem    Assessment/Plan   1. Acute on chronic systolic CHF: EF 26-94% with diffuse hypokinesis on 12/16 echo. Nonischemic cardiomyopathy, probably related to Adriamycin with ovarian cancer treatment. Cardiac MRI in 1/17 showed EF 29% with normal RV and septal-lateral dyssynchrony. There was a non-cardiac pattern of LGE in the septum, cannot rule out prior myocarditis based on this pattern. She has a Medtronic CRT-D device. Echo 4/19 with EF 30-35%, then echo in 8/20 showed EF up to 45-50%. TEE in 3/21 showed EF 30-35%. Echo in 11/21 showed EF 25-30% with mild LV dilation, global hypokinesis with septal-lateral dyssynchrony, moderate MR, moderately decreased RV systolic function with normal RV size, IVC normal.  TEE in 3/22 showed EF 20-25%, moderate LV dilation, moderately decreased RV systolic function, moderate TR, and severe MR with malcoaptation of P2/P3 scallops.  She was admitted with worsening dyspnea/volume overload.  She has been in NSR while here. SBP generally 90s, this is where she runs at home. Remains fluid overloaded on exam today.   - Continue to hold Toprol XL while markedly volume overloaded and with soft BP.  - Continue spironolactone 25 mg daily. - She will stay off Entresto given syncope and lightheadedness while taking it.Will not start losartan with soft BP. -C/w Lasix 40 mg IV bid.  - Hold Farxiga for now.  - Continue digoxin, low dose 0.0625 to start with given CKD 3 and small size.  - Severe MR has worsened HF, will need evaluation for Mitraclip (see below).  - RHC probably Wednesday after further diuresis.  Not candidate for advanced therapies with age.  2. Atrial arrhythmias: She needs to maintain NSR as she loses BiV pacing when she goes into atrial arrhythmias, triggering CHF exacerbation. She had atrial fibrillation ablation in 4/21. She went into atrial flutter in  11/21, she had DCCV in 12/21, went back in atrial flutter on amiodarone.I think that her QT interval has been too long for Tikosyn. She had redo AF ablation in 3/22.  She is in  NSR today.  -Amiodarone was stopped again with rise in LFTs.  Follow for now, ?due to HF or due to amiodarone, may rechallenge in future.  - Continue apixaban, she has not missed doses.  3. Hypothyroidism: Suspect related to prior amiodarone use.  - Continue Levoxyl.  4. Elevated LFTs: Mild elevation in past, resolved in 11/21. Abdominal US showed unremarkable liver. ?Related to amiodarone. She is now back on amiodarone and LFTs are increased again as above, ?due to amiodarone versus CHF. -Stop amiodarone for now.  5. CAD: Calcification noted in coronaries with pulmonary vein CT done prior to afib ablation.  - Continue atorvastatin 40 daily, good lipids in 8/21.  - No ASA given apixaban use.  6. Mitral regurgitation: Severe MR on TEE in 3/22.  Suspect atrial enlargement has stretched MV annulus, has P2-P3 malcoaptation. Suspect primarily functional.  MR has clearly worsened, suspect it is helping to drive CHF.  She is not a candidate for surgical MV repair.  - Will have our structural heart team see her to determine candidacy for Mitraclip.  She just had AF ablation last week, so ideally would stay on anticoagulation without missing doses for at least a month.  7. Hyponatremia: Hypervolemic hyponatremia.  Na 123 => 126 with diuresis =>127 after dose of Tolvaptan. Remains fluid overloaded.  - continue diuresis w/ IV Lasix  8. Fe deficiency: Low transferrin saturation - received feraheme 4/4   Length of Stay: [address] PA-C  07/21/2020, 8:07 AM  Advanced Heart Failure Team Pager [phone] (M-F; 7a - 5p)  Please contact [facility] for night-coverage after hours (5p -7a ) and weekends on [email or web address]  Patient seen with PA, agree with the above note.    Good diuresis yesterday, weight down.   Creatinine stable 1.24.  She had tolvaptan again yesterday with Na up to 127.  SBP stable in 90s.   General: NAD Neck: JVP 10 cm, no thyromegaly or thyroid nodule.  Lungs: Clear to auscultation bilaterally with normal respiratory effort. CV: Lateral PMI.  Heart regular S1/S2, no S3/S4, 2/6 HSM apex.  No peripheral edema.   Abdomen: Soft, nontender, no hepatosplenomegaly, no distention.  Skin: Intact without lesions or rashes.  Neurologic: Alert and oriented x 3.  Psych: Normal affect. Extremities: No clubbing or cyanosis.  HEENT: Normal.   Good diuresis yesterday with stable creatinine.  Continue Lasix 40 mg IV bid today.  She has been started on low dose digoxin.  No BP room right now to titrate meds.  Will plan RHC tomorrow to assess filling pressures and cardiac output, discussed risks/benefits with patient and she agrees to procedure.    Na rising to 127, will not dose tolvaptan again.  Keep po fluid restricted.   She remains in NSR post-AF ablation last week. She is off amiodarone with recurrent rise in LFTs.  She is on apixaban.   Worsening HF symptoms over time.  Options for management are limited, not candidate for advanced therapies based on age.  Low BP limits med titration.  Severe MR appears to play a role, Dr. Burt Knack to see today to discuss Mitraclip.  If decision is made to proceed, will need discussion about peri-procedural anticoagulation as she had a recent AF ablation.   [name] 07/21/2020 1:40 PM

## 2020-07-22 ENCOUNTER — Encounter (HOSPITAL_COMMUNITY)
Admission: EM | Disposition: A | Discharge status: Home / Self Care | Payer: Self-pay | Source: Home / Self Care | Attending: Internal Medicine

## 2020-07-22 DIAGNOSIS — I5043 Acute on chronic combined systolic (congestive) and diastolic (congestive) heart failure: Secondary | ICD-10-CM | POA: Diagnosis not present

## 2020-07-22 DIAGNOSIS — I509 Heart failure, unspecified: Secondary | ICD-10-CM

## 2020-07-22 HISTORY — PX: RIGHT HEART CATH: CATH118263

## 2020-07-22 LAB — SURGICAL PCR SCREEN
MRSA, PCR: NEGATIVE
Staphylococcus aureus: POSITIVE — AB

## 2020-07-22 LAB — COMPREHENSIVE METABOLIC PANEL
ALT: 99 U/L — ABNORMAL HIGH (ref 0–44)
AST: 104 U/L — ABNORMAL HIGH (ref 15–41)
Albumin: 2.9 g/dL — ABNORMAL LOW (ref 3.5–5.0)
Alkaline Phosphatase: 97 U/L (ref 38–126)
Anion gap: 10 (ref 5–15)
BUN: 18 mg/dL (ref 8–23)
CO2: 24 mmol/L (ref 22–32)
Calcium: 8.8 mg/dL — ABNORMAL LOW (ref 8.9–10.3)
Chloride: 97 mmol/L — ABNORMAL LOW (ref 98–111)
Creatinine, Ser: 1.3 mg/dL — ABNORMAL HIGH (ref 0.44–1.00)
GFR, Estimated: 41 mL/min — ABNORMAL LOW (ref >60)
Glucose, Bld: 97 mg/dL (ref 70–99)
Potassium: 3.9 mmol/L (ref 3.5–5.1)
Sodium: 131 mmol/L — ABNORMAL LOW (ref 135–145)
Total Bilirubin: 2 mg/dL — ABNORMAL HIGH (ref 0.3–1.2)
Total Protein: 6.4 g/dL — ABNORMAL LOW (ref 6.5–8.1)

## 2020-07-22 LAB — CBC
HCT: 35.1 % — ABNORMAL LOW (ref 36.0–46.0)
Hemoglobin: 11.9 g/dL — ABNORMAL LOW (ref 12.0–15.0)
MCH: 28.3 pg (ref 26.0–34.0)
MCHC: 33.9 g/dL (ref 30.0–36.0)
MCV: 83.6 fL (ref 80.0–100.0)
Platelets: 142 10*3/uL — ABNORMAL LOW (ref 150–400)
RBC: 4.2 MIL/uL (ref 3.87–5.11)
RDW: 19 % — ABNORMAL HIGH (ref 11.5–15.5)
WBC: 6.4 10*3/uL (ref 4.0–10.5)
nRBC: 0.8 % — ABNORMAL HIGH (ref 0.0–0.2)

## 2020-07-22 SURGERY — RIGHT HEART CATH
Anesthesia: LOCAL

## 2020-07-22 MED ORDER — HEPARIN (PORCINE) IN NACL 1000-0.9 UT/500ML-% IV SOLN
INTRAVENOUS | Status: DC | PRN
  Administered 2020-07-22: 500 mL

## 2020-07-22 MED ORDER — SODIUM CHLORIDE 0.9 % IV SOLN
INTRAVENOUS | Status: DC
  Filled 2020-07-22: qty 30

## 2020-07-22 MED ORDER — CHLORHEXIDINE GLUCONATE 0.12 % MT SOLN
15.0000 mL | Freq: Once | OROMUCOSAL | Status: AC
  Administered 2020-07-23: 15 mL via OROMUCOSAL
  Filled 2020-07-22: qty 15

## 2020-07-22 MED ORDER — SODIUM CHLORIDE 0.9 % IV SOLN: INTRAVENOUS | Status: AC

## 2020-07-22 MED ORDER — VANCOMYCIN HCL 1000 MG/200ML IV SOLN
1000.0000 mg | INTRAVENOUS | Status: DC
  Filled 2020-07-22: qty 200

## 2020-07-22 MED ORDER — CHLORHEXIDINE GLUCONATE CLOTH 2 % EX PADS
6.0000 | MEDICATED_PAD | Freq: Once | CUTANEOUS | Status: AC
  Administered 2020-07-22: 6 via TOPICAL

## 2020-07-22 MED ORDER — NOREPINEPHRINE 4 MG/250ML-% IV SOLN
0.0000 ug/min | INTRAVENOUS | Status: DC
  Filled 2020-07-22: qty 250

## 2020-07-22 MED ORDER — LIDOCAINE HCL (PF) 1 % IJ SOLN
INTRAMUSCULAR | Status: AC
  Filled 2020-07-22: qty 30

## 2020-07-22 MED ORDER — HEPARIN (PORCINE) IN NACL 1000-0.9 UT/500ML-% IV SOLN
INTRAVENOUS | Status: AC
  Filled 2020-07-22: qty 500

## 2020-07-22 MED ORDER — LIDOCAINE HCL (PF) 1 % IJ SOLN
INTRAMUSCULAR | Status: DC | PRN
  Administered 2020-07-22 (×2): 2 mL

## 2020-07-22 MED ORDER — MAGNESIUM SULFATE 50 % IJ SOLN
40.0000 meq | INTRAMUSCULAR | Status: DC
  Filled 2020-07-22: qty 9.85

## 2020-07-22 MED ORDER — MUPIROCIN 2 % EX OINT
1.0000 "application " | TOPICAL_OINTMENT | Freq: Two times a day (BID) | CUTANEOUS | Status: DC
  Administered 2020-07-22 – 2020-07-24 (×4): 1 via NASAL
  Filled 2020-07-22 (×3): qty 22

## 2020-07-22 MED ORDER — SODIUM CHLORIDE 0.9 % IV SOLN
1.5000 g | INTRAVENOUS | Status: DC
  Filled 2020-07-22: qty 1.5

## 2020-07-22 MED ORDER — DEXMEDETOMIDINE HCL IN NACL 400 MCG/100ML IV SOLN
0.1000 ug/kg/h | INTRAVENOUS | Status: DC
  Filled 2020-07-22: qty 100

## 2020-07-22 MED ORDER — POTASSIUM CHLORIDE 2 MEQ/ML IV SOLN
80.0000 meq | INTRAVENOUS | Status: DC
  Filled 2020-07-22: qty 40

## 2020-07-22 MED ORDER — APIXABAN 2.5 MG PO TABS
2.5000 mg | ORAL_TABLET | Freq: Two times a day (BID) | ORAL | Status: AC
  Administered 2020-07-22 (×2): 2.5 mg via ORAL
  Filled 2020-07-22 (×2): qty 1

## 2020-07-22 SURGICAL SUPPLY — 7 items
CATH SWAN GANZ 7F STRAIGHT (CATHETERS) ×2 IMPLANT
GLIDESHEATH SLENDER 7FR .021G (SHEATH) ×2 IMPLANT
KIT HEART LEFT (KITS) ×2 IMPLANT
PACK CARDIAC CATHETERIZATION (CUSTOM PROCEDURE TRAY) ×2 IMPLANT
SHEATH PROBE COVER 6X72 (BAG) ×4 IMPLANT
TRANSDUCER W/STOPCOCK (MISCELLANEOUS) ×2 IMPLANT
WIRE EMERALD 3MM-J .025X260CM (WIRE) ×2 IMPLANT

## 2020-07-22 NOTE — Interval H&P Note (Signed)
History and Physical Interval Note:  07/22/2020 10:00 AM  Cynthia Frank  has presented today for surgery, with the diagnosis of heart failure.  The various methods of treatment have been discussed with the patient and family. After consideration of risks, benefits and other options for treatment, the patient has consented to  Procedure(s): RIGHT HEART CATH (N/A) as a surgical intervention.  The patient's history has been reviewed, patient examined, no change in status, stable for surgery.  I have reviewed the patient's chart and labs.  Questions were answered to the patient's satisfaction.     Krystal Delduca [employer]

## 2020-07-22 NOTE — Progress Notes (Signed)
Patient ID: Cynthia Frank, female   DOB: [birth year], 85 y.o.   MRN: [id number]     Advanced Heart Failure Rounding Note  PCP-Cardiologist: Dr. Aundra Dubin  EP: Dr. Curt Bears   Subjective:    Good diuresis again with IV Lasix, weight down.    Sodium up to 131, creatinine 1.3.   SBPs remain soft in 90s (chronic).   No dyspnea currently, walked in hall yesterday.   RHC Procedural Findings: Hemodynamics (mmHg) RA mean 1 RV 29/1 PA 33/12, mean 21 PCWP mean 14, v-waves do not appear prominent.  Oxygen saturations: PA 69% AO 99% Cardiac Output (Fick) 4.3  Cardiac Index (Fick) 2.74 Cardiac Output (Thermo) 5.34  Cardiac Index (Thermo) 3.4  Objective:   Weight Range: 53.6 kg Body mass index is 20.29 kg/m.   Vital Signs:   Temp:  [97.6 F (36.4 C)-98.3 F (36.8 C)] 97.6 F (36.4 C) (04/06 0828) Pulse Rate:  [95-100] 97 (04/06 1027) Resp:  [7-107] 17 (04/06 1027) BP: (89-146)/(30-120) 89/31 (04/06 1027) SpO2:  [95 %-100 %] 97 % (04/06 1027) Weight:  [53.6 kg-55 kg] 53.6 kg (04/06 0500) Last BM Date: 07/20/20  Weight change: Filed Weights   07/21/20 0510 07/21/20 1248 07/22/20 0500  Weight: 54.7 kg 55 kg 53.6 kg    Intake/Output:   Intake/Output Summary (Last 24 hours) at 07/22/2020 1040 Last data filed at 07/22/2020 0500 Gross per 24 hour  Intake 580 ml  Output 2800 ml  Net -2220 ml      Physical Exam    General: NAD Neck: No JVD, no thyromegaly or thyroid nodule.  Lungs: Clear to auscultation bilaterally with normal respiratory effort. CV: Nondisplaced PMI.  Heart regular S1/S2, no S3/S4, 3/6 HSM apex.  No peripheral edema.  Abdomen: Soft, nontender, no hepatosplenomegaly, no distention.  Skin: Intact without lesions or rashes.  Neurologic: Alert and oriented x 3.  Psych: Normal affect. Extremities: No clubbing or cyanosis.  HEENT: Normal.    Telemetry   NSR with BiV pacing 90s (personally reviewed)  Labs    CBC Recent Labs    07/21/20 0105  07/22/20 0403  WBC 5.8 6.4  HGB 10.7* 11.9*  HCT 31.7* 35.1*  MCV 84.1 83.6  PLT 144* 627*   Basic Metabolic Panel Recent Labs    07/21/20 0105 07/22/20 0403  NA 127* 131*  K 3.8 3.9  CL 95* 97*  CO2 23 24  GLUCOSE 91 97  BUN 20 18  CREATININE 1.24* 1.30*  CALCIUM 8.4* 8.8*   Liver Function Tests Recent Labs    07/21/20 0105 07/22/20 0403  AST 138* 104*  ALT 109* 99*  ALKPHOS 92 97  BILITOT 2.0* 2.0*  PROT 5.8* 6.4*  ALBUMIN 2.7* 2.9*   No results for input(s): LIPASE, AMYLASE in the last 72 hours. Cardiac Enzymes No results for input(s): CKTOTAL, CKMB, CKMBINDEX, TROPONINI in the last 72 hours.  BNP: BNP (last 3 results) Recent Labs    07/18/20 2026  BNP 2,412.6*    ProBNP (last 3 results) No results for input(s): PROBNP in the last 8760 hours.   D-Dimer No results for input(s): DDIMER in the last 72 hours. Hemoglobin A1C No results for input(s): HGBA1C in the last 72 hours. Fasting Lipid Panel No results for input(s): CHOL, HDL, LDLCALC, TRIG, CHOLHDL, LDLDIRECT in the last 72 hours. Thyroid Function Tests No results for input(s): TSH, T4TOTAL, T3FREE, THYROIDAB in the last 72 hours.  Invalid input(s): FREET3  Other results:   Imaging  CARDIAC CATHETERIZATION  Result Date: 07/22/2020 1. Low to normal filling pressures. 2. Preserved cardiac output. Filling pressures optimized after aggressive diuresis.  Will stop IV Lasix today and will give a small amt of NS with RA pressure 1.     Medications:     Scheduled Medications: . [MAR Hold] apixaban  2.5 mg Oral BID  . [MAR Hold] atorvastatin  40 mg Oral Daily  . [MAR Hold] digoxin  0.0625 mg Oral Daily  . [MAR Hold] ipratropium  1 spray Each Nare BID  . [MAR Hold] levothyroxine  25 mcg Oral QAC breakfast  . [MAR Hold] magnesium oxide  800 mg Oral BID  . [START ON 07/23/2020] magnesium sulfate  40 mEq Other To OR  . [START ON 07/23/2020] potassium chloride  80 mEq Other To OR  . [MAR Hold]  potassium chloride SA  20 mEq Oral Daily  . [MAR Hold] sertraline  25 mg Oral Daily  . [MAR Hold] sodium chloride flush  3 mL Intravenous Q12H  . [MAR Hold] sodium chloride flush  3 mL Intravenous Q12H  . [MAR Hold] spironolactone  25 mg Oral QHS    Infusions: . [MAR Hold] sodium chloride    . sodium chloride    . sodium chloride 10 mL/hr at 07/22/20 0455  . sodium chloride    . [START ON 07/23/2020] cefUROXime (ZINACEF)  IV    . [START ON 07/23/2020] dexmedetomidine    . [START ON 07/23/2020] heparin 30,000 units/NS 1000 mL solution for CELLSAVER    . [START ON 07/23/2020] norepinephrine (LEVOPHED) Adult infusion    . [START ON 07/23/2020] vancomycin      PRN Medications: [MAR Hold] sodium chloride, sodium chloride, [MAR Hold] acetaminophen, [MAR Hold] clobetasol cream, [MAR Hold] conjugated estrogens, [MAR Hold] ketoconazole, [MAR Hold] ondansetron (ZOFRAN) IV, [MAR Hold] sodium chloride flush, sodium chloride flush, [MAR Hold] zolpidem    Assessment/Plan   1. Acute on chronic systolic CHF: EF 19-75% with diffuse hypokinesis on 12/16 echo. Nonischemic cardiomyopathy, probably related to Adriamycin with ovarian cancer treatment. Cardiac MRI in 1/17 showed EF 29% with normal RV and septal-lateral dyssynchrony. There was a non-cardiac pattern of LGE in the septum, cannot rule out prior myocarditis based on this pattern. She has a Medtronic CRT-D device. Echo 4/19 with EF 30-35%, then echo in 8/20 showed EF up to 45-50%. TEE in 3/21 showed EF 30-35%. Echo in 11/21 showed EF 25-30% with mild LV dilation, global hypokinesis with septal-lateral dyssynchrony, moderate MR, moderately decreased RV systolic function with normal RV size, IVC normal.  TEE in 3/22 showed EF 20-25%, moderate LV dilation, moderately decreased RV systolic function, moderate TR, and severe MR with malcoaptation of P2/P3 scallops.  She was admitted with worsening dyspnea/volume overload.  She has been in NSR while here. SBP  generally 90s, this is where she runs at home. Good diuresis.  RHC today shows optimized (to low) volume status with preserved cardiac output.    - Continue to hold Toprol XL with soft BP.  - Continue spironolactone 25 mg daily. - She will stay off Entresto given syncope and lightheadedness while taking it.Will not start losartan with soft BP. -Stop Lasix today, with RA pressure 1 on RHC will give NS 50 cc/hr x 5 hrs.   - Hold Farxiga for now.  - Continue digoxin, low dose 0.0625 to start with given CKD 3 and small size.  - Severe MR has worsened HF, plan for Mitraclip.  - Not candidate for advanced therapies  with age.  2. Atrial arrhythmias: She needs to maintain NSR as she loses BiV pacing when she goes into atrial arrhythmias, triggering CHF exacerbation. She had atrial fibrillation ablation in 4/21. She went into atrial flutter in 11/21, she had DCCV in 12/21, went back in atrial flutter on amiodarone.I think that her QT interval has been too long for Tikosyn. She had redo AF ablation in 3/22.  She is in NSR today.  -Amiodarone was stopped again with rise in LFTs.  Follow for now, ?due to HF or due to amiodarone, may rechallenge in future.  - Continue apixaban, she has not missed doses.  3. Hypothyroidism: Suspect related to prior amiodarone use.  - Continue Levoxyl.  4. Elevated LFTs: Mild elevation in past, resolved in 11/21. Abdominal US showed unremarkable liver. ?Related to amiodarone. She is now back on amiodarone and LFTs are increased again as above, ?due to amiodarone versus CHF. -Stop amiodarone for now.  5. CAD: Calcification noted in coronaries with pulmonary vein CT done prior to afib ablation.  - Continue atorvastatin 40 daily, good lipids in 8/21.  - No ASA given apixaban use.  6. Mitral regurgitation: Severe MR on TEE in 3/22.  Suspect atrial enlargement has stretched MV annulus, has P2-P3 malcoaptation. Suspect primarily functional.  MR has clearly worsened,  suspect it is helping to drive CHF.  She is not a candidate for surgical MV repair.  RHC today after diuresis with optimized (to low) filling pressures.  In setting of low filling pressures, she does not have prominent v-waves on PCWP tracing.  - Seen by Dr. Burt Knack yesterday, plan for Mitraclip tomorrow.   7. Hyponatremia: Hypervolemic hyponatremia.  Na up to 131, improved. 8. Fe deficiency: Low transferrin saturation - received feraheme 4/4   Length of Stay: [place], MD  07/22/2020, 10:40 AM  Advanced Heart Failure Team Pager [phone] (M-F; 7a - 5p)  Please contact [facility] for night-coverage after hours (5p -7a ) and weekends on [email or web address]

## 2020-07-22 NOTE — Progress Notes (Addendum)
  [facility]   MULTIDISCIPLINARY HEART VALVE TEAM  Pt will get last dose of Eliquis this PM ( given recent afib ablation). Then NPO after midnight. I have placed MitraClip orders and case scheduled for 4/7 @ 1:30pm.   Angelena Form PA-C  [facility]

## 2020-07-22 NOTE — Evaluation (Signed)
Physical Therapy Evaluation Patient Details Name: AZIYA ARENA MRN: [id number] DOB: 01-Oct-1935 Today's Date: 07/22/2020   History of Present Illness  85 y.o. female presents to [facility] ED on 07/18/2020 with concerns of weakness, dizziness, DOE, since recent ablation on 07/15/2020. Pt found to be hyponatremic with severe MR. Pt underwent R heart cath on 07/22/2020. Pt with planned MitraClip procedure on 07/23/2020. PMH includes niCM, HFrEF (25%), DM, HTN, HLD, hypothyroid, and AF s/p recent ablation 07/15/20.  Clinical Impression  Pt presents to PT with deficits in balance and gait, although the pt reports these to be chronic deficits. Pt demonstrates increased sway when ambulating without UE support and reports balance to be poor. Pt is able to perform all functional mobility without physical assistance at this time, reporting no symptoms of dizziness, weakness, or DOE. Pt will benefit from gait training with use of cane next session and assessment of mobility after MitraClip procedure. PT recommends no PT needs at the time of discharge.    Follow Up Recommendations No PT follow up;Supervision - Intermittent    Equipment Recommendations  None recommended by PT (PT recommends use of cane for mobility)    Recommendations for Other Services       Precautions / Restrictions Precautions Precautions: Fall Restrictions Weight Bearing Restrictions: No      Mobility  Bed Mobility Overal bed mobility: Modified Independent             General bed mobility comments: increased time    Transfers Overall transfer level: Needs assistance Equipment used: None Transfers: Sit to/from Stand Sit to Stand: Supervision            Ambulation/Gait Ambulation/Gait assistance: Supervision Gait Distance (Feet): 200 Feet Assistive device: None Gait Pattern/deviations: Step-through pattern;Drifts right/left Gait velocity: reduced Gait velocity interpretation: <1.8 ft/sec, indicate of risk for  recurrent falls General Gait Details: pt with slowed step-through gait, mild lateral drift without UE support  Stairs Stairs:  (pt fatigues prior to reaching stairwell)          Wheelchair Mobility    Modified Rankin (Stroke Patients Only)       Balance Overall balance assessment: Needs assistance Sitting-balance support: No upper extremity supported;Feet supported Sitting balance-Leahy Scale: Good     Standing balance support: No upper extremity supported;During functional activity Standing balance-Leahy Scale: Good                               Pertinent Vitals/Pain Pain Assessment: No/denies pain    Home Living Family/patient expects to be discharged to:: Private residence Living Arrangements: Spouse/significant other Available Help at Discharge: Family;Available 24 hours/day Type of Home: House Home Access: Stairs to enter Entrance Stairs-Rails: None Entrance Stairs-Number of Steps: 1 Home Layout: Two level Home Equipment: Walker - 2 wheels;Cane - single point;Bedside commode      Prior Function Level of Independence: Independent         Comments: pt reports holding onto walls or furniture at times when ambulating     Hand Dominance        Extremity/Trunk Assessment   Upper Extremity Assessment Upper Extremity Assessment: Overall WFL for tasks assessed    Lower Extremity Assessment Lower Extremity Assessment: Generalized weakness    Cervical / Trunk Assessment Cervical / Trunk Assessment: Normal  Communication   Communication: No difficulties  Cognition Arousal/Alertness: Awake/alert Behavior During Therapy: WFL for tasks assessed/performed Overall Cognitive Status: Within Functional Limits for tasks assessed  General Comments General comments (skin integrity, edema, etc.): VSS on RA, pt denies symptoms of dizziness, lightheadedness, DOE    Exercises      Assessment/Plan    PT Assessment Patient needs continued PT services  PT Problem List Decreased activity tolerance;Decreased balance;Decreased knowledge of use of DME;Cardiopulmonary status limiting activity       PT Treatment Interventions DME instruction;Gait training;Stair training;Functional mobility training;Therapeutic activities;Therapeutic exercise;Balance training;Neuromuscular re-education;Patient/family education    PT Goals (Current goals can be found in the Care Plan section)  Acute Rehab PT Goals Patient Stated Goal: to return to independence and improve balance PT Goal Formulation: With patient/family Time For Goal Achievement: 08/05/20 Potential to Achieve Goals: Good    Frequency Min 3X/week   Barriers to discharge        Co-evaluation               AM-PAC PT "6 Clicks" Mobility  Outcome Measure Help needed turning from your back to your side while in a flat bed without using bedrails?: None Help needed moving from lying on your back to sitting on the side of a flat bed without using bedrails?: None Help needed moving to and from a bed to a chair (including a wheelchair)?: A Little Help needed standing up from a chair using your arms (e.g., wheelchair or bedside chair)?: A Little Help needed to walk in hospital room?: A Little Help needed climbing 3-5 steps with a railing? : A Little 6 Click Score: 20    End of Session   Activity Tolerance: Patient tolerated treatment well Patient left: in bed;with call bell/phone within reach;with bed alarm set;with family/visitor present Nurse Communication: Mobility status PT Visit Diagnosis: Other abnormalities of gait and mobility (R26.89)    Time: 9150-5697 PT Time Calculation (min) (ACUTE ONLY): 15 min   Charges:   PT Evaluation $PT Eval Low Complexity: 1 Low          Zenaida Niece, PT, DPT Acute Rehabilitation Pager: [phone]   Zenaida Niece 07/22/2020, 2:54 PM

## 2020-07-22 NOTE — Progress Notes (Signed)
   07/22/20 1200  Clinical Encounter Type  Visited With Patient and family together  Visit Type Spiritual support;Social support  Referral From Nurse  Consult/Referral To Chaplain  The chaplain spoke with patient to follow up on a spiritual care consult. The chaplain spoke with the patient while her husband was bedside. The patient was eating lunch. The husband expressed she had come to the procedure and had an appetite. The chaplain spoke briefly with the patient and her husband and offered words of support. The patient thanked me for coming by and will notify me if she has any other needs. The chaplain will follow up as needed.

## 2020-07-22 NOTE — Plan of Care (Signed)

## 2020-07-23 ENCOUNTER — Inpatient Hospital Stay (HOSPITAL_COMMUNITY): Payer: Medicare Other | Admitting: Anesthesiology

## 2020-07-23 ENCOUNTER — Encounter (HOSPITAL_COMMUNITY): Payer: Self-pay | Admitting: Cardiology

## 2020-07-23 ENCOUNTER — Other Ambulatory Visit: Payer: Self-pay | Admitting: Physician Assistant

## 2020-07-23 ENCOUNTER — Encounter (HOSPITAL_COMMUNITY)
Admission: EM | Disposition: A | Discharge status: Home / Self Care | Payer: Self-pay | Source: Home / Self Care | Attending: Internal Medicine

## 2020-07-23 ENCOUNTER — Inpatient Hospital Stay (HOSPITAL_COMMUNITY): Payer: Medicare Other

## 2020-07-23 DIAGNOSIS — Z006 Encounter for examination for normal comparison and control in clinical research program: Secondary | ICD-10-CM

## 2020-07-23 DIAGNOSIS — Z9889 Other specified postprocedural states: Secondary | ICD-10-CM

## 2020-07-23 DIAGNOSIS — I340 Nonrheumatic mitral (valve) insufficiency: Secondary | ICD-10-CM

## 2020-07-23 DIAGNOSIS — Z95818 Presence of other cardiac implants and grafts: Secondary | ICD-10-CM

## 2020-07-23 DIAGNOSIS — I5023 Acute on chronic systolic (congestive) heart failure: Secondary | ICD-10-CM | POA: Diagnosis not present

## 2020-07-23 DIAGNOSIS — Z20822 Contact with and (suspected) exposure to covid-19: Secondary | ICD-10-CM | POA: Diagnosis not present

## 2020-07-23 DIAGNOSIS — I081 Rheumatic disorders of both mitral and tricuspid valves: Secondary | ICD-10-CM | POA: Diagnosis not present

## 2020-07-23 DIAGNOSIS — I130 Hypertensive heart and chronic kidney disease with heart failure and stage 1 through stage 4 chronic kidney disease, or unspecified chronic kidney disease: Secondary | ICD-10-CM | POA: Diagnosis not present

## 2020-07-23 HISTORY — PX: TEE WITHOUT CARDIOVERSION: SHX5443

## 2020-07-23 HISTORY — PX: MITRAL VALVE REPAIR: CATH118311

## 2020-07-23 HISTORY — DX: Other specified postprocedural states: Z98.890

## 2020-07-23 HISTORY — DX: Presence of other cardiac implants and grafts: Z95.818

## 2020-07-23 LAB — POCT I-STAT EG7
Acid-Base Excess: 4 mmol/L — ABNORMAL HIGH (ref 0.0–2.0)
Acid-Base Excess: 4 mmol/L — ABNORMAL HIGH (ref 0.0–2.0)
Bicarbonate: 27.8 mmol/L (ref 20.0–28.0)
Bicarbonate: 28.3 mmol/L — ABNORMAL HIGH (ref 20.0–28.0)
Calcium, Ion: 1.14 mmol/L — ABNORMAL LOW (ref 1.15–1.40)
Calcium, Ion: 1.16 mmol/L (ref 1.15–1.40)
HCT: 39 % (ref 36.0–46.0)
HCT: 39 % (ref 36.0–46.0)
Hemoglobin: 13.3 g/dL (ref 12.0–15.0)
Hemoglobin: 13.3 g/dL (ref 12.0–15.0)
O2 Saturation: 68 %
O2 Saturation: 70 %
Potassium: 3.4 mmol/L — ABNORMAL LOW (ref 3.5–5.1)
Potassium: 3.4 mmol/L — ABNORMAL LOW (ref 3.5–5.1)
Sodium: 135 mmol/L (ref 135–145)
Sodium: 136 mmol/L (ref 135–145)
TCO2: 29 mmol/L (ref 22–32)
TCO2: 29 mmol/L (ref 22–32)
pCO2, Ven: 39.7 mmHg — ABNORMAL LOW (ref 44.0–60.0)
pCO2, Ven: 39.7 mmHg — ABNORMAL LOW (ref 44.0–60.0)
pH, Ven: 7.454 — ABNORMAL HIGH (ref 7.250–7.430)
pH, Ven: 7.461 — ABNORMAL HIGH (ref 7.250–7.430)
pO2, Ven: 34 mmHg (ref 32.0–45.0)
pO2, Ven: 35 mmHg (ref 32.0–45.0)

## 2020-07-23 LAB — TYPE AND SCREEN
ABO/RH(D): A POS
Antibody Screen: NEGATIVE

## 2020-07-23 LAB — COMPREHENSIVE METABOLIC PANEL
ALT: 77 U/L — ABNORMAL HIGH (ref 0–44)
AST: 69 U/L — ABNORMAL HIGH (ref 15–41)
Albumin: 2.8 g/dL — ABNORMAL LOW (ref 3.5–5.0)
Alkaline Phosphatase: 97 U/L (ref 38–126)
Anion gap: 9 (ref 5–15)
BUN: 18 mg/dL (ref 8–23)
CO2: 24 mmol/L (ref 22–32)
Calcium: 8.8 mg/dL — ABNORMAL LOW (ref 8.9–10.3)
Chloride: 97 mmol/L — ABNORMAL LOW (ref 98–111)
Creatinine, Ser: 1.13 mg/dL — ABNORMAL HIGH (ref 0.44–1.00)
GFR, Estimated: 49 mL/min — ABNORMAL LOW (ref >60)
Glucose, Bld: 97 mg/dL (ref 70–99)
Potassium: 3.6 mmol/L (ref 3.5–5.1)
Sodium: 130 mmol/L — ABNORMAL LOW (ref 135–145)
Total Bilirubin: 1.9 mg/dL — ABNORMAL HIGH (ref 0.3–1.2)
Total Protein: 6.3 g/dL — ABNORMAL LOW (ref 6.5–8.1)

## 2020-07-23 LAB — CBC
HCT: 35.3 % — ABNORMAL LOW (ref 36.0–46.0)
Hemoglobin: 11.7 g/dL — ABNORMAL LOW (ref 12.0–15.0)
MCH: 28 pg (ref 26.0–34.0)
MCHC: 33.1 g/dL (ref 30.0–36.0)
MCV: 84.4 fL (ref 80.0–100.0)
Platelets: 141 10*3/uL — ABNORMAL LOW (ref 150–400)
RBC: 4.18 MIL/uL (ref 3.87–5.11)
RDW: 19.2 % — ABNORMAL HIGH (ref 11.5–15.5)
WBC: 5.7 10*3/uL (ref 4.0–10.5)
nRBC: 0.5 % — ABNORMAL HIGH (ref 0.0–0.2)

## 2020-07-23 LAB — GLUCOSE, CAPILLARY: Glucose-Capillary: 116 mg/dL — ABNORMAL HIGH (ref 70–99)

## 2020-07-23 LAB — POCT ACTIVATED CLOTTING TIME: Activated Clotting Time: 356 seconds

## 2020-07-23 SURGERY — MITRAL VALVE REPAIR
Anesthesia: General

## 2020-07-23 MED ORDER — ROCURONIUM BROMIDE 10 MG/ML (PF) SYRINGE
PREFILLED_SYRINGE | INTRAVENOUS | Status: DC | PRN
  Administered 2020-07-23: 50 mg via INTRAVENOUS

## 2020-07-23 MED ORDER — APIXABAN 2.5 MG PO TABS
2.5000 mg | ORAL_TABLET | Freq: Two times a day (BID) | ORAL | Status: DC
  Administered 2020-07-23 – 2020-07-24 (×2): 2.5 mg via ORAL
  Filled 2020-07-23 (×2): qty 1

## 2020-07-23 MED ORDER — ETOMIDATE 2 MG/ML IV SOLN
INTRAVENOUS | Status: DC | PRN
  Administered 2020-07-23: 14 mg via INTRAVENOUS

## 2020-07-23 MED ORDER — CHLORHEXIDINE GLUCONATE 0.12 % MT SOLN
OROMUCOSAL | Status: AC
  Administered 2020-07-23: 15 mL via OROMUCOSAL
  Filled 2020-07-23: qty 15

## 2020-07-23 MED ORDER — HEPARIN (PORCINE) IN NACL 2000-0.9 UNIT/L-% IV SOLN
INTRAVENOUS | Status: DC | PRN
  Administered 2020-07-23 (×4): 1000 mL

## 2020-07-23 MED ORDER — HEPARIN (PORCINE) IN NACL 1000-0.9 UT/500ML-% IV SOLN
INTRAVENOUS | Status: AC
  Filled 2020-07-23: qty 1500

## 2020-07-23 MED ORDER — HEPARIN SODIUM (PORCINE) 1000 UNIT/ML IJ SOLN
INTRAMUSCULAR | Status: DC | PRN
  Administered 2020-07-23: 7000 [IU] via INTRAVENOUS

## 2020-07-23 MED ORDER — SODIUM CHLORIDE 0.9 % IV SOLN: Freq: Once | INTRAVENOUS | Status: DC

## 2020-07-23 MED ORDER — FENTANYL CITRATE (PF) 250 MCG/5ML IJ SOLN
INTRAMUSCULAR | Status: DC | PRN
  Administered 2020-07-23: 100 ug via INTRAVENOUS

## 2020-07-23 MED ORDER — HEPARIN (PORCINE) IN NACL 1000-0.9 UT/500ML-% IV SOLN
INTRAVENOUS | Status: DC | PRN
  Administered 2020-07-23: 500 mL

## 2020-07-23 MED ORDER — SODIUM CHLORIDE 0.9 % IV SOLN: 250.0000 mL | INTRAVENOUS | Status: DC | PRN

## 2020-07-23 MED ORDER — ONDANSETRON HCL 4 MG/2ML IJ SOLN
INTRAMUSCULAR | Status: DC | PRN
  Administered 2020-07-23: 4 mg via INTRAVENOUS

## 2020-07-23 MED ORDER — HEPARIN (PORCINE) IN NACL 2000-0.9 UNIT/L-% IV SOLN
INTRAVENOUS | Status: AC
  Filled 2020-07-23: qty 4000

## 2020-07-23 MED ORDER — ONDANSETRON HCL 4 MG/2ML IJ SOLN: 4.0000 mg | Freq: Once | INTRAMUSCULAR | Status: DC | PRN

## 2020-07-23 MED ORDER — SODIUM CHLORIDE 0.9% FLUSH
3.0000 mL | Freq: Two times a day (BID) | INTRAVENOUS | Status: DC
  Administered 2020-07-23: 3 mL via INTRAVENOUS

## 2020-07-23 MED ORDER — LACTATED RINGERS IV SOLN: INTRAVENOUS | Status: DC

## 2020-07-23 MED ORDER — SODIUM CHLORIDE 0.9% FLUSH: 3.0000 mL | INTRAVENOUS | Status: DC | PRN

## 2020-07-23 MED ORDER — LIDOCAINE 2% (20 MG/ML) 5 ML SYRINGE
INTRAMUSCULAR | Status: DC | PRN
  Administered 2020-07-23: 60 mg via INTRAVENOUS

## 2020-07-23 MED ORDER — PROTAMINE SULFATE 10 MG/ML IV SOLN
INTRAVENOUS | Status: DC | PRN
  Administered 2020-07-23: 20 mg via INTRAVENOUS
  Administered 2020-07-23: 10 mg via INTRAVENOUS

## 2020-07-23 MED ORDER — SUGAMMADEX SODIUM 200 MG/2ML IV SOLN
INTRAVENOUS | Status: DC | PRN
  Administered 2020-07-23: 200 mg via INTRAVENOUS

## 2020-07-23 MED ORDER — PHENYLEPHRINE HCL-NACL 10-0.9 MG/250ML-% IV SOLN
INTRAVENOUS | Status: DC | PRN
  Administered 2020-07-23: 25 ug/min via INTRAVENOUS

## 2020-07-23 MED ORDER — CEFAZOLIN SODIUM-DEXTROSE 2-3 GM-%(50ML) IV SOLR
INTRAVENOUS | Status: DC | PRN
  Administered 2020-07-23: 2 g via INTRAVENOUS

## 2020-07-23 MED ORDER — SENNOSIDES-DOCUSATE SODIUM 8.6-50 MG PO TABS
1.0000 | ORAL_TABLET | Freq: Two times a day (BID) | ORAL | Status: DC | PRN
  Administered 2020-07-23: 1 via ORAL
  Filled 2020-07-23: qty 1

## 2020-07-23 MED ORDER — LACTATED RINGERS IV SOLN: INTRAVENOUS | Status: DC | PRN

## 2020-07-23 MED ORDER — DEXAMETHASONE SODIUM PHOSPHATE 10 MG/ML IJ SOLN
INTRAMUSCULAR | Status: DC | PRN
  Administered 2020-07-23: 8 mg via INTRAVENOUS

## 2020-07-23 MED ORDER — CEFAZOLIN SODIUM-DEXTROSE 2-4 GM/100ML-% IV SOLN
INTRAVENOUS | Status: AC
  Filled 2020-07-23: qty 100

## 2020-07-23 SURGICAL SUPPLY — 17 items
CATH MITRA STEERABLE GUIDE (CATHETERS) ×1 IMPLANT
CLIP MITRA G4 DELIVERY SYS NTW (Clip) ×1 IMPLANT
CLOSURE PERCLOSE PROSTYLE (VASCULAR PRODUCTS) ×3 IMPLANT
ELECT DEFIB PAD ADLT CADENCE (PAD) ×1 IMPLANT
KIT DILATOR VASC 18G NDL (KITS) ×1 IMPLANT
KIT HEART LEFT (KITS) ×5 IMPLANT
KIT MICROPUNCTURE NIT STIFF (SHEATH) ×1 IMPLANT
KIT VERSACROSS LRG ACCESS (CATHETERS) ×1 IMPLANT
PACK CARDIAC CATHETERIZATION (CUSTOM PROCEDURE TRAY) ×2 IMPLANT
SHEATH PINNACLE 8F 10CM (SHEATH) ×1 IMPLANT
SHEATH PROBE COVER 6X72 (BAG) ×2 IMPLANT
SHIELD RADPAD SCOOP 12X17 (MISCELLANEOUS) ×1 IMPLANT
STOPCOCK MORSE 400PSI 3WAY (MISCELLANEOUS) ×12 IMPLANT
SYSTEM MITRACLIP G4 (SYSTAGENIX WOUND MANAGEMENT) ×1 IMPLANT
TRANSDUCER W/STOPCOCK (MISCELLANEOUS) ×2 IMPLANT
TUBING ART PRESS 72  MALE/FEM (TUBING) ×2
TUBING ART PRESS 72 MALE/FEM (TUBING) ×1 IMPLANT

## 2020-07-23 NOTE — Transfer of Care (Signed)
Immediate Anesthesia Transfer of Care Note  Patient: Cynthia Frank  Procedure(s) Performed: MITRAL VALVE REPAIR (N/A ) TRANSESOPHAGEAL ECHOCARDIOGRAM (TEE) (N/A )  Patient Location: Cath Lab  Anesthesia Type:General  Level of Consciousness: awake, alert  and oriented  Airway & Oxygen Therapy: Patient Spontanous Breathing and Patient connected to nasal cannula oxygen  Post-op Assessment: Report given to RN and Post -op Vital signs reviewed and stable  Post vital signs: Reviewed and stable  Last Vitals:  Vitals Value Taken Time  BP    Temp    Pulse 71 07/23/20 1535  Resp 17 07/23/20 1535  SpO2 92 % 07/23/20 1535  Vitals shown include unvalidated device data.  Last Pain:  Vitals:   07/23/20 0800  TempSrc:   PainSc: 0-No pain      Patients Stated Pain Goal: 0 (87/56/43 3295)  Complications: No complications documented.

## 2020-07-23 NOTE — Progress Notes (Addendum)
Patient ID: Cynthia Frank, female   DOB: [birth year], 85 y.o.   MRN: [id number]     Advanced Heart Failure Rounding Note  PCP-Cardiologist: Dr. Aundra Dubin  EP: Dr. Curt Bears   Subjective:    Good diuresis w/ IV Lasix. RHC yesterday showed optimized (to low) volume status with preserved cardiac output.  Lasix held, IVFs given back.  Feels well today. No dyspnea. Wt down another 3 lb. Na 130. LFTs trending down Scheduled for MitraClip today. Daughter and granddaughter at beside.    RHC Procedural Findings: Hemodynamics (mmHg) RA mean 1 RV 29/1 PA 33/12, mean 21 PCWP mean 14, v-waves do not appear prominent.  Oxygen saturations: PA 69% AO 99% Cardiac Output (Fick) 4.3  Cardiac Index (Fick) 2.74 Cardiac Output (Thermo) 5.34  Cardiac Index (Thermo) 3.4  Objective:   Weight Range: 52.3 kg Body mass index is 19.81 kg/m.   Vital Signs:   Temp:  [97.4 F (36.3 C)-98.1 F (36.7 C)] 97.5 F (36.4 C) (04/07 0441) Pulse Rate:  [63-101] 97 (04/07 0936) Resp:  [14-18] 18 (04/07 0441) BP: (82-131)/(31-84) 104/82 (04/07 0441) SpO2:  [93 %-98 %] 93 % (04/07 0441) Weight:  [52.3 kg] 52.3 kg (04/07 0450) Last BM Date: 07/20/20  Weight change: Filed Weights   07/21/20 1248 07/22/20 0500 07/23/20 0450  Weight: 55 kg 53.6 kg 52.3 kg    Intake/Output:   Intake/Output Summary (Last 24 hours) at 07/23/2020 1018 Last data filed at 07/22/2020 2300 Gross per 24 hour  Intake 655 ml  Output 1000 ml  Net -345 ml      Physical Exam    General: thin elderly WF sitting up in bed, no distress  Neck: No JVD, no thyromegaly or thyroid nodule.  Lungs: Clear to auscultation bilaterally with normal respiratory effort. CV: Nondisplaced PMI.  Heart regular S1/S2, no S3/S4, 3/6 HSM apex.  No peripheral edema.  Abdomen: Soft, nontender, no hepatosplenomegaly, no distention.  Skin: Intact without lesions or rashes.  Neurologic: Alert and oriented x 3. Moves all 3 extremities w/o difficulty   Psych: Normal affect. Pleasant  Extremities: thin extremities. No clubbing or cyanosis. No edema HEENT: Normal.    Telemetry   NSR with BiV pacing 90s-low 100s (personally reviewed)  Labs    CBC Recent Labs    07/22/20 0403 07/23/20 0444  WBC 6.4 5.7  HGB 11.9* 11.7*  HCT 35.1* 35.3*  MCV 83.6 84.4  PLT 142* 497*   Basic Metabolic Panel Recent Labs    07/22/20 0403 07/23/20 0444  NA 131* 130*  K 3.9 3.6  CL 97* 97*  CO2 24 24  GLUCOSE 97 97  BUN 18 18  CREATININE 1.30* 1.13*  CALCIUM 8.8* 8.8*   Liver Function Tests Recent Labs    07/22/20 0403 07/23/20 0444  AST 104* 69*  ALT 99* 77*  ALKPHOS 97 97  BILITOT 2.0* 1.9*  PROT 6.4* 6.3*  ALBUMIN 2.9* 2.8*   No results for input(s): LIPASE, AMYLASE in the last 72 hours. Cardiac Enzymes No results for input(s): CKTOTAL, CKMB, CKMBINDEX, TROPONINI in the last 72 hours.  BNP: BNP (last 3 results) Recent Labs    07/18/20 2026  BNP 2,412.6*    ProBNP (last 3 results) No results for input(s): PROBNP in the last 8760 hours.   D-Dimer No results for input(s): DDIMER in the last 72 hours. Hemoglobin A1C No results for input(s): HGBA1C in the last 72 hours. Fasting Lipid Panel No results for input(s): CHOL, HDL, LDLCALC, TRIG, CHOLHDL,  LDLDIRECT in the last 72 hours. Thyroid Function Tests No results for input(s): TSH, T4TOTAL, T3FREE, THYROIDAB in the last 72 hours.  Invalid input(s): FREET3  Other results:   Imaging    No results found.   Medications:     Scheduled Medications: . atorvastatin  40 mg Oral Daily  . digoxin  0.0625 mg Oral Daily  . ipratropium  1 spray Each Nare BID  . levothyroxine  25 mcg Oral QAC breakfast  . magnesium oxide  800 mg Oral BID  . mupirocin ointment  1 application Nasal BID  . potassium chloride SA  20 mEq Oral Daily  . sertraline  25 mg Oral Daily  . sodium chloride flush  3 mL Intravenous Q12H  . sodium chloride flush  3 mL Intravenous Q12H  .  spironolactone  25 mg Oral QHS    Infusions: . sodium chloride    . cefUROXime (ZINACEF)  IV    . vancomycin      PRN Medications: sodium chloride, acetaminophen, clobetasol cream, conjugated estrogens, ketoconazole, ondansetron (ZOFRAN) IV, sodium chloride flush, zolpidem    Assessment/Plan   1. Acute on chronic systolic CHF: EF 62-70% with diffuse hypokinesis on 12/16 echo. Nonischemic cardiomyopathy, probably related to Adriamycin with ovarian cancer treatment. Cardiac MRI in 1/17 showed EF 29% with normal RV and septal-lateral dyssynchrony. There was a non-cardiac pattern of LGE in the septum, cannot rule out prior myocarditis based on this pattern. She has a [employer] CRT-D device. Echo 4/19 with EF 30-35%, then echo in 8/20 showed EF up to 45-50%. TEE in 3/21 showed EF 30-35%. Echo in 11/21 showed EF 25-30% with mild LV dilation, global hypokinesis with septal-lateral dyssynchrony, moderate MR, moderately decreased RV systolic function with normal RV size, IVC normal.  TEE in 3/22 showed EF 20-25%, moderate LV dilation, moderately decreased RV systolic function, moderate TR, and severe MR with malcoaptation of P2/P3 scallops.  She was admitted with worsening dyspnea/volume overload.  She has been in NSR while here. SBP generally 90s, this is where she runs at home. Good diuresis.  RHC yesterday showed optimized (to low) volume status with preserved cardiac output. Lasix held. Remains stable, wt down another 3 lb. No dyspnea.    - Continue to hold Toprol XL with soft BP.  - Continue spironolactone 25 mg daily. - She will stay off Entresto given syncope and lightheadedness while taking it.Will not start losartan with soft BP. -Continue to hold lasix today   - Hold Farxiga for now.  - Continue digoxin, low dose 0.0625 to start with given CKD 3 and small size.  - Severe MR has worsened HF, plan for Mitraclip today.  - Not candidate for advanced therapies with age.  2. Atrial  arrhythmias: She needs to maintain NSR as she loses BiV pacing when she goes into atrial arrhythmias, triggering CHF exacerbation. She had atrial fibrillation ablation in 4/21. She went into atrial flutter in 11/21, she had DCCV in 12/21, went back in atrial flutter on amiodarone.I think that her QT interval has been too long for Tikosyn. She had redo AF ablation in 3/22.  She is in NSR today.  -Amiodarone was stopped again with rise in LFTs.  Follow for now, ?due to HF or due to amiodarone, may rechallenge in future.  - Continue apixaban, she has not missed doses.  3. Hypothyroidism: Suspect related to prior amiodarone use.  - Continue Levoxyl.  4. Elevated LFTs: Mild elevation in past, resolved in 11/21. Abdominal US showed  unremarkable liver. ?Related to amiodarone. She is now back on amiodarone and LFTs are increased again as above, ?due to amiodarone versus CHF.LFTs now downtrending  -continue to hold amiodarone for now.  5. CAD: Calcification noted in coronaries with pulmonary vein CT done prior to afib ablation.  - Continue atorvastatin 40 daily, good lipids in 8/21.  - No ASA given apixaban use.  6. Mitral regurgitation: Severe MR on TEE in 3/22.  Suspect atrial enlargement has stretched MV annulus, has P2-P3 malcoaptation. Suspect primarily functional.  MR has clearly worsened, suspect it is helping to drive CHF.  She is not a candidate for surgical MV repair.  RHC today after diuresis with optimized (to low) filling pressures.  In setting of low filling pressures, she does not have prominent v-waves on PCWP tracing.  - Seen by Dr. Burt Knack, plan for Mitraclip today   7. Hyponatremia: Hypervolemic hyponatremia.  Na up to 130, improved. 8. Fe deficiency: Low transferrin saturation - received feraheme 4/4   Length of Stay: [address] PA-C  07/23/2020, 10:18 AM  Advanced Heart Failure Team Pager [phone] (M-F; 7a - 5p)  Please contact [facility] for  night-coverage after hours (5p -7a ) and weekends on [email or web address]  Patient seen with PA, agree with the above note.   RHC showed low filling pressures yesterday after diuresis.  Feeling good today, denies dyspnea.  Creatinine stable. SBP 100s.   General: NAD Neck: No JVD, no thyromegaly or thyroid nodule.  Lungs: Clear to auscultation bilaterally with normal respiratory effort. CV: Nondisplaced PMI.  Heart regular S1/S2, no S3/S4, 2/6 HSM apex.  No peripheral edema.   Abdomen: Soft, nontender, no hepatosplenomegaly, no distention.  Skin: Intact without lesions or rashes.  Neurologic: Alert and oriented x 3.  Psych: Normal affect. Extremities: No clubbing or cyanosis.  HEENT: Normal.   Euvolemic, no diuretic to be given today.  Check digoxin level in am.  Will likely restart dapagliflozin tomorrow morning.   Will get ECG to confirm rhythm, looks like still in NSR.   Plan for Mitraclip today with Dr. Burt Knack.  Discussed with patient and daughter.   [name] 07/23/2020 10:58 AM

## 2020-07-23 NOTE — Progress Notes (Signed)
LOCATION: right RADIAL   DRESSING APPLIED: gauze with tegaderm  SITE UPON ARRIVAL: LEVEL 0  SITE AFTER BAND REMOVAL: LEVEL0  CIRCULATION SENSATION AND MOVEMENT: bilateral radial pulses at +2  COMMENTS: a-line removed, manual pressure held for approximately 10 minutes

## 2020-07-23 NOTE — Anesthesia Procedure Notes (Signed)
Procedure Name: Intubation Date/Time: 07/23/2020 1:44 PM Performed by: Mariea Clonts, CRNA Pre-anesthesia Checklist: Patient identified, Emergency Drugs available, Suction available and Patient being monitored Patient Re-evaluated:Patient Re-evaluated prior to induction Oxygen Delivery Method: Circle System Utilized Preoxygenation: Pre-oxygenation with 100% oxygen Induction Type: IV induction Ventilation: Mask ventilation without difficulty Laryngoscope Size: Miller and 2 Grade View: Grade II Tube type: Oral Tube size: 7.0 mm Number of attempts: 1 Airway Equipment and Method: Stylet and Oral airway Placement Confirmation: ETT inserted through vocal cords under direct vision,  positive ETCO2 and breath sounds checked- equal and bilateral Tube secured with: Tape Dental Injury: Teeth and Oropharynx as per pre-operative assessment

## 2020-07-23 NOTE — Interval H&P Note (Signed)
History and Physical Interval Note:  07/23/2020 1:22 PM  Cynthia Frank  has presented today for surgery, with the diagnosis of Severe Mitral Insufficiency.  The various methods of treatment have been discussed with the patient and family. After consideration of risks, benefits and other options for treatment, the patient has consented to  Procedure(s): MITRAL VALVE REPAIR (N/A) TRANSESOPHAGEAL ECHOCARDIOGRAM (TEE) (N/A) as a surgical intervention.  The patient's history has been reviewed, patient examined, no change in status, stable for surgery.  I have reviewed the patient's chart and labs.  Questions were answered to the patient's satisfaction.     Sherren Mocha

## 2020-07-23 NOTE — Progress Notes (Signed)
  Echocardiogram Echocardiogram Transesophageal has been performed.  Cynthia Frank M 07/23/2020, 3:36 PM

## 2020-07-23 NOTE — Progress Notes (Signed)
Dr. Burt Knack made aware of pt BP, see flowsheet, order obtained, safety maintained

## 2020-07-23 NOTE — Care Management Important Message (Signed)
Important Message  Patient Details  Name: Cynthia Frank MRN: [id number] Date of Birth: 11-Aug-1935   Medicare Important Message Given:  Yes     Shelda Altes 07/23/2020, 11:09 AM

## 2020-07-23 NOTE — Anesthesia Procedure Notes (Signed)
Arterial Line Insertion Start/End4/10/2020 1:00 PM, 07/23/2020 1:05 PM Performed by: Betha Loa, CRNA, CRNA  Patient location: Pre-op. Preanesthetic checklist: patient identified, IV checked, site marked, risks and benefits discussed, surgical consent, monitors and equipment checked, pre-op evaluation, timeout performed and anesthesia consent Lidocaine 1% used for infiltration Right, radial was placed Catheter size: 20 G Hand hygiene performed  and maximum sterile barriers used   Attempts: 1 Procedure performed without using ultrasound guided technique. Following insertion, Biopatch and dressing applied. Post procedure assessment: normal  Patient tolerated the procedure well with no immediate complications.

## 2020-07-23 NOTE — Progress Notes (Signed)
Patient transported to cath lab, then to short-stay for planned Mitra-Clip procedure.  EKG done prior to discharge.  Transferred via stretcher accompanied by 2 attendants.

## 2020-07-23 NOTE — TOC Initial Note (Signed)
Transition of Care (TOC) - Initial/Assessment Note  Heart Failure   Patient Details  Name: Cynthia Frank MRN: [id number] Date of Birth: [birth year]  Transition of Care [facility]) CM/SW Contact:    St[place], [place] Phone Number: 07/23/2020, 10:56 AM  Clinical Narrative:                 CSW completed SDOH with the patient who denied having any needs at this time. CSW provided the patient and family member with social workers name and position and if anything changes to please reach out so that CSW can provide support.   Expected Discharge Plan: Home/Self Care Barriers to Discharge: Continued Medical Work up   Patient Goals and CMS Choice Patient states their goals for this hospitalization and ongoing recovery are:: to return home      Expected Discharge Plan and Services Expected Discharge Plan: Home/Self Care In-house Referral: Clinical Social Work     Living arrangements for the past 2 months: Apartment                                      Prior Living Arrangements/Services Living arrangements for the past 2 months: Apartment   Patient language and need for interpreter reviewed:: Yes Do you feel safe going back to the place where you live?: Yes      Need for Family Participation in Patient Care: No (Comment) Care giver support system in place?: No (comment)   Criminal Activity/Legal Involvement Pertinent to Current Situation/Hospitalization: No - Comment as needed  Activities of Daily Living Home Assistive Devices/Equipment: None ADL Screening (condition at time of admission) Patient's cognitive ability adequate to safely complete daily activities?: Yes Is the patient deaf or have difficulty hearing?: Yes Does the patient have difficulty seeing, even when wearing glasses/contacts?: Yes Does the patient have difficulty concentrating, remembering, or making decisions?: No Patient able to express need for assistance with ADLs?: Yes Does the patient have  difficulty dressing or bathing?: No Independently performs ADLs?: Yes (appropriate for developmental age) Does the patient have difficulty walking or climbing stairs?: No Weakness of Legs: None Weakness of Arms/Hands: None  Permission Sought/Granted Permission sought to share information with : Case Manager,Family Supports                Emotional Assessment Appearance:: Appears stated age Attitude/Demeanor/Rapport: Engaged Affect (typically observed): Pleasant Orientation: : Oriented to Self,Oriented to Place,Oriented to Situation,Oriented to  Time Alcohol / Substance Use: Not Applicable Psych Involvement: No (comment)  Admission diagnosis:  Acute on chronic systolic heart failure (HCC) [I50.23] Weakness [R53.1] Heart failure with reduced ejection fraction ([place]) [I50.20] Patient Active Problem List   Diagnosis Date Noted  . Heart failure with reduced ejection fraction ([place]) 07/18/2020  . Hyponatremia 07/18/2020  . Anemia 07/18/2020  . Elevated LFTs 07/18/2020  . Severe mitral regurgitation   . Vertigo 03/27/2019  . Orthostatic hypotension   . Syncope and collapse   . Closed right hip fracture ([place]) 12/16/2018  . Hypokalemia 12/16/2018  . Dysphoric mood 08/11/2017  . Insomnia 04/21/2016  . Arthritis 04/21/2016  . Chronic systolic CHF (congestive heart failure) ([place]) 06/03/2015  . GERD (gastroesophageal reflux disease) 04/21/2015  . Personal history of colon cancer   . Routine general medical examination at a health care facility 03/24/2015  . Nonischemic cardiomyopathy ([place])   . Coronary artery disease   . Hyperlipidemia   . Atrial fibrillation ([place])  12/11/2012  . Cough 05/16/2012  . THYROID NODULE 04/27/2007  . Essential hypertension 04/27/2007   PCP:  [name], MD Pharmacy:   [facility] [place] [place] [address] Phone: [phone]  Fax: [phone]     Social Determinants of Health (SDOH) Interventions Food Insecurity Interventions: Intervention Not Indicated Financial Strain Interventions: Intervention Not Indicated Housing Interventions: Intervention Not Indicated Transportation Interventions: Intervention Not Indicated  Readmission Risk Interventions No flowsheet data found.  Taegan Haider, MSW, [place] Heart Failure Social Worker

## 2020-07-23 NOTE — Plan of Care (Signed)
  Problem: Education: Goal: Knowledge of General Education information will improve Description: Including pain rating scale, medication(s)/side effects and non-pharmacologic comfort measures Outcome: Progressing   Problem: Health Behavior/Discharge Planning: Goal: Ability to manage health-related needs will improve Outcome: Progressing   Problem: Clinical Measurements: Goal: Cardiovascular complication will be avoided Outcome: Progressing   Problem: Nutrition: Goal: Adequate nutrition will be maintained Outcome: Progressing   Problem: Pain Managment: Goal: General experience of comfort will improve Outcome: Progressing   Problem: Skin Integrity: Goal: Risk for impaired skin integrity will decrease Outcome: Progressing

## 2020-07-23 NOTE — Anesthesia Preprocedure Evaluation (Addendum)
Anesthesia Evaluation  Patient identified by MRN, date of birth, ID band  Airway Mallampati: II  TM Distance: >3 FB Neck ROM: Full    Dental  (+) Teeth Intact   Pulmonary neg pulmonary ROS, former smoker,    Pulmonary exam normal        Cardiovascular hypertension, Pt. on medications and Pt. on home beta blockers + CAD and +CHF  + dysrhythmias Atrial Fibrillation + Cardiac Defibrillator + Valvular Problems/Murmurs MR  Rhythm:Regular Rate:Normal     Neuro/Psych negative neurological ROS  negative psych ROS   GI/Hepatic Neg liver ROS, GERD  Medicated,  Endo/Other  diabetes  Renal/GU negative Renal ROS  negative genitourinary   Musculoskeletal  (+) Arthritis , Osteoarthritis,    Abdominal (+)  Abdomen: soft. Bowel sounds: normal.  Peds  Hematology  (+) anemia ,   Anesthesia Other Findings   Reproductive/Obstetrics                           Anesthesia Physical Anesthesia Plan  ASA: IV  Anesthesia Plan: General   Post-op Pain Management:    Induction: Intravenous  PONV Risk Score and Plan: 3 and Ondansetron and Treatment may vary due to age or medical condition  Airway Management Planned: Mask and Oral ETT  Additional Equipment: Arterial line  Intra-op Plan:   Post-operative Plan: Extubation in OR  Informed Consent: I have reviewed the patients History and Physical, chart, labs and discussed the procedure including the risks, benefits and alternatives for the proposed anesthesia with the patient or authorized representative who has indicated his/her understanding and acceptance.     Dental advisory given  Plan Discussed with: CRNA  Anesthesia Plan Comments: (ECHO 03/22: 1. Severe Mitral Regurgitation with pulmonary vein flow reversal and ERO  43 mm2. Mechanism appears to be atrial function and malcoaptation of P2-P3  as largest contributor. Estimated MVA 4.80 cm PML length  1.35 mm without  significant MAC. The mitral  valve is grossly normal. . The mean mitral valve gradient is 1.0 mmHg with  average heart rate of 80 bpm.  2. Left atrial size was moderately dilated. No left atrial/left atrial  appendage thrombus was detected.  3. Left ventricular ejection fraction, by estimation, is 20 to 25%. The  left ventricle has severely decreased function. The left ventricle  demonstrates global hypokinesis. The left ventricular internal cavity size  was moderately to severely dilated.  4. Right ventricular systolic function is moderately reduced. The right  ventricular size is normal.  5. Right atrial size was mild to moderately dilated.  6. Moderate and eccentric tricuspid regurgitation without evidence of  disastolic TR to favor device related.  7. The aortic valve is tricuspid. Aortic valve regurgitation is mild.  8. There is mild (Grade II) plaque involving the descending aorta.  Lab Results      Component                Value               Date                      WBC                      5.7                 07/23/2020  HGB                      11.7 (L)            07/23/2020                HCT                      35.3 (L)            07/23/2020                MCV                      84.4                07/23/2020                PLT                      141 (L)             07/23/2020           Lab Results      Component                Value               Date                      NA                       130 (L)             07/23/2020                K                        3.6                 07/23/2020                CO2                      24                  07/23/2020                GLUCOSE                  97                  07/23/2020                BUN                      18                  07/23/2020                CREATININE               1.13 (H)            07/23/2020                CALCIUM                  8.8 (  L)              07/23/2020                GFRNONAA                 49 (L)              07/23/2020                GFRAA                    54 (L)              12/12/2019          )       Anesthesia Quick Evaluation

## 2020-07-23 NOTE — Progress Notes (Signed)
Physical Therapy Treatment  Cynthia Frank Pre Assessment  Patient Details Name: Cynthia Frank MRN: [id number] DOB: [birth year] Today's Date: 07/23/2020    History of Present Illness 85 y.o. female presents to [facility] ED on 07/18/2020 with concerns of weakness, dizziness, DOE, since recent ablation on 07/15/2020. Pt found to be hyponatremic with severe MR. Pt underwent R heart cath on 07/22/2020. Pt with planned MitraClip procedure on 07/23/2020. PMH includes niCM, HFrEF (25%), DM, HTN, HLD, hypothyroid, and AF s/p recent ablation 07/15/20.  Pt for TAVR procedure 4/7.    PT Comments    07/23/2020 PT TAVR Pre-Assessment    6 Minute Walk Test:   Total Distance Walked:1786 ft.    Did the pt need a rest break? Yes If yes, why? Pain:No; Fatigue:Yes; Dyspnea/O2 saturations: No Comments: Pt was fatigued, but surprised at the low level of dyspnea today.   Pre-Test Post-Test  BP 95/75(81) 113/80(91)  HR 103 bpm 113 bpm  O2 saturations (indicated RA or L/min [place]) 97% 96%  Modified Borg Dyspnea Scale (0 none-10 maximal) .5 4  RPE (6 very light-10 very hard) 7 13  Comments:   5 Meter Walk Test:  Trial 1 6.38 seconds  Trial 2 4.78 seconds  Trial 3 5.51 seconds  3 Trial Average/Gait Speed 5.55 seconds/2.72 ft/sec (<1.8 ft/sec indicates high fall risk)  Comments: Pt was a little distracted within/between testing, but made a good showing.  Clinical Frailty Scale (1 very fit - 9 terminally ill): 4 (>/= 3/9 is considered frail)   @SIGNATURE @   07/23/2020  Cynthia Frank., PT Acute Rehabilitation Services [phone]  (pager) [phone]  (office)            Follow Up Recommendations  No PT follow up;Supervision - Intermittent     Equipment Recommendations  None recommended by PT    Recommendations for Other Services       Precautions / Restrictions Precautions Precautions: Fall Restrictions Weight Bearing Restrictions: No    Mobility  Bed Mobility Overal bed mobility: Modified  Independent             General bed mobility comments: increased time    Transfers Overall transfer level: Needs assistance Equipment used: None Transfers: Sit to/from Stand Sit to Stand: Supervision            Ambulation/Gait Ambulation/Gait assistance: Supervision Gait Distance (Feet): 1500 Feet Assistive device: None Gait Pattern/deviations: Step-through pattern Gait velocity: reduced Gait velocity interpretation: 1.31 - 2.62 ft/sec, indicative of limited community ambulator General Gait Details: mild unsteadiness overall with deviation , but no overt LOB, pt used rail when available, but has used the RW lately when no stationary objects available.   Stairs             Wheelchair Mobility    Modified Rankin (Stroke Patients Only)       Balance     Sitting balance-Leahy Scale: Good       Standing balance-Leahy Scale: Good                              Cognition Arousal/Alertness: Awake/alert Behavior During Therapy: WFL for tasks assessed/performed Overall Cognitive Status: Within Functional Limits for tasks assessed                                        Exercises      General  Comments General comments (skin integrity, edema, etc.): see PRE-TAVR assessment in PROGRESS NOTE,      Pertinent Vitals/Pain Pain Assessment: No/denies pain    Home Living                      Prior Function            PT Goals (current goals can now be found in the care plan section) Acute Rehab PT Goals Patient Stated Goal: to return to independence and improve balance PT Goal Formulation: With patient/family Time For Goal Achievement: 08/05/20 Potential to Achieve Goals: Good Progress towards PT goals: Progressing toward goals    Frequency    Min 3X/week      PT Plan Current plan remains appropriate    Co-evaluation              AM-PAC PT "6 Clicks" Mobility   Outcome Measure  Help needed  turning from your back to your side while in a flat bed without using bedrails?: None Help needed moving from lying on your back to sitting on the side of a flat bed without using bedrails?: None Help needed moving to and from a bed to a chair (including a wheelchair)?: A Little Help needed standing up from a chair using your arms (e.g., wheelchair or bedside chair)?: A Little Help needed to walk in hospital room?: A Little Help needed climbing 3-5 steps with a railing? : A Little 6 Click Score: 20    End of Session   Activity Tolerance: Patient tolerated treatment well Patient left: in bed;with call bell/phone within reach;with bed alarm set;with family/visitor present Nurse Communication: Mobility status PT Visit Diagnosis: Other abnormalities of gait and mobility (R26.89)     Time: 8841-6606 PT Time Calculation (min) (ACUTE ONLY): 41 min  Charges:  $Therapeutic Activity: 38-52 mins                     07/23/2020  Cynthia Frank., PT Acute Rehabilitation Services [phone]  (pager) [phone]  (office)   Cynthia Frank 07/23/2020, 11:45 AM

## 2020-07-23 NOTE — Progress Notes (Signed)
Pt with bruising to BUE and left groin upon arrival to [facility], RCIS tech states bruising was present prior to procedure

## 2020-07-24 ENCOUNTER — Inpatient Hospital Stay (HOSPITAL_COMMUNITY): Payer: Medicare Other

## 2020-07-24 ENCOUNTER — Encounter (HOSPITAL_COMMUNITY): Payer: Self-pay | Admitting: Cardiovascular Disease

## 2020-07-24 DIAGNOSIS — I5043 Acute on chronic combined systolic (congestive) and diastolic (congestive) heart failure: Secondary | ICD-10-CM | POA: Diagnosis not present

## 2020-07-24 DIAGNOSIS — Z95818 Presence of other cardiac implants and grafts: Secondary | ICD-10-CM

## 2020-07-24 DIAGNOSIS — I5023 Acute on chronic systolic (congestive) heart failure: Secondary | ICD-10-CM | POA: Diagnosis not present

## 2020-07-24 DIAGNOSIS — Z20822 Contact with and (suspected) exposure to covid-19: Secondary | ICD-10-CM | POA: Diagnosis not present

## 2020-07-24 DIAGNOSIS — Z954 Presence of other heart-valve replacement: Secondary | ICD-10-CM

## 2020-07-24 DIAGNOSIS — Z9889 Other specified postprocedural states: Secondary | ICD-10-CM

## 2020-07-24 DIAGNOSIS — Z006 Encounter for examination for normal comparison and control in clinical research program: Secondary | ICD-10-CM | POA: Diagnosis not present

## 2020-07-24 DIAGNOSIS — I081 Rheumatic disorders of both mitral and tricuspid valves: Secondary | ICD-10-CM | POA: Diagnosis not present

## 2020-07-24 LAB — ECHOCARDIOGRAM COMPLETE
AR max vel: 1.17 cm2
AV Area VTI: 1 cm2
AV Area mean vel: 1.24 cm2
AV Mean grad: 3 mmHg
AV Peak grad: 5.9 mmHg
Ao pk vel: 1.21 m/s
Area-P 1/2: 3.1 cm2
Calc EF: 23 %
Height: 64 in
S' Lateral: 4.8 cm
Single Plane A2C EF: 35.2 %
Single Plane A4C EF: 15.2 %
Weight: 1915.36 oz

## 2020-07-24 LAB — COMPREHENSIVE METABOLIC PANEL
ALT: 55 U/L — ABNORMAL HIGH (ref 0–44)
AST: 49 U/L — ABNORMAL HIGH (ref 15–41)
Albumin: 2.7 g/dL — ABNORMAL LOW (ref 3.5–5.0)
Alkaline Phosphatase: 85 U/L (ref 38–126)
Anion gap: 8 (ref 5–15)
BUN: 16 mg/dL (ref 8–23)
CO2: 23 mmol/L (ref 22–32)
Calcium: 8.4 mg/dL — ABNORMAL LOW (ref 8.9–10.3)
Chloride: 98 mmol/L (ref 98–111)
Creatinine, Ser: 0.93 mg/dL (ref 0.44–1.00)
GFR, Estimated: >60 mL/min (ref >60)
Glucose, Bld: 154 mg/dL — ABNORMAL HIGH (ref 70–99)
Potassium: 3.6 mmol/L (ref 3.5–5.1)
Sodium: 129 mmol/L — ABNORMAL LOW (ref 135–145)
Total Bilirubin: 1.6 mg/dL — ABNORMAL HIGH (ref 0.3–1.2)
Total Protein: 5.9 g/dL — ABNORMAL LOW (ref 6.5–8.1)

## 2020-07-24 LAB — ECHO TEE
AR max vel: 2.38 cm2
AV Area VTI: 2.46 cm2
AV Area mean vel: 2.19 cm2
AV Mean grad: 1 mmHg
AV Peak grad: 2.3 mmHg
Ao pk vel: 0.75 m/s
MV M vel: 3.88 m/s
MV Peak grad: 60.2 mmHg
Radius: 0.6 cm

## 2020-07-24 LAB — CBC
HCT: 34.7 % — ABNORMAL LOW (ref 36.0–46.0)
Hemoglobin: 11.4 g/dL — ABNORMAL LOW (ref 12.0–15.0)
MCH: 28.3 pg (ref 26.0–34.0)
MCHC: 32.9 g/dL (ref 30.0–36.0)
MCV: 86.1 fL (ref 80.0–100.0)
Platelets: 136 10*3/uL — ABNORMAL LOW (ref 150–400)
RBC: 4.03 MIL/uL (ref 3.87–5.11)
RDW: 20 % — ABNORMAL HIGH (ref 11.5–15.5)
WBC: 6.7 10*3/uL (ref 4.0–10.5)
nRBC: 0 % (ref 0.0–0.2)

## 2020-07-24 LAB — DIGOXIN LEVEL: Digoxin Level: 0.6 ng/mL — ABNORMAL LOW (ref 0.8–2.0)

## 2020-07-24 MED ORDER — PERFLUTREN LIPID MICROSPHERE
1.0000 mL | INTRAVENOUS | Status: AC | PRN
  Administered 2020-07-24: 2 mL via INTRAVENOUS
  Filled 2020-07-24: qty 10

## 2020-07-24 MED ORDER — MUPIROCIN 2 % EX OINT: 1.0000 "application " | TOPICAL_OINTMENT | Freq: Two times a day (BID) | CUTANEOUS | 0 refills | Status: DC

## 2020-07-24 MED ORDER — FUROSEMIDE 40 MG PO TABS: 40.0000 mg | ORAL_TABLET | Freq: Every day | ORAL | 5 refills | Status: AC

## 2020-07-24 MED ORDER — DIGOXIN 62.5 MCG PO TABS: 0.0625 mg | ORAL_TABLET | ORAL | 6 refills | Status: DC

## 2020-07-24 MED ORDER — DAPAGLIFLOZIN PROPANEDIOL 10 MG PO TABS
10.0000 mg | ORAL_TABLET | Freq: Every day | ORAL | Status: DC
  Filled 2020-07-24: qty 1

## 2020-07-24 NOTE — Progress Notes (Addendum)
Patient ID: Cynthia Frank, female   DOB: 1935-08-06, 85 y.o.   MRN: [id number]     Advanced Heart Failure Rounding Note  PCP-Cardiologist: Dr. Aundra Dubin  EP: Dr. Curt Bears   Subjective:    S/P Mitral Clip   Wants to go home. Denies SOB.   Objective:   Weight Range: 54.3 kg Body mass index is 20.55 kg/m.   Vital Signs:   Temp:  [97.3 F (36.3 C)-97.7 F (36.5 C)] 97.6 F (36.4 C) (04/08 0714) Pulse Rate:  [64-97] 64 (04/08 0714) Resp:  [13-22] 18 (04/08 0714) BP: (82-128)/(47-104) 101/67 (04/08 0714) SpO2:  [90 %-97 %] 95 % (04/08 0344) Weight:  [54.3 kg] 54.3 kg (04/08 0344) Last BM Date: 07/21/20  Weight change: Filed Weights   07/22/20 0500 07/23/20 0450 07/24/20 0344  Weight: 53.6 kg 52.3 kg 54.3 kg    Intake/Output:   Intake/Output Summary (Last 24 hours) at 07/24/2020 0818 Last data filed at 07/23/2020 2319 Gross per 24 hour  Intake 1130 ml  Output --  Net 1130 ml      Physical Exam    General:   No resp difficulty HEENT: normal Neck: supple. no JVD. Carotids 2+ bilat; no bruits. No lymphadenopathy or thryomegaly appreciated. Cor: PMI nondisplaced. Regular rate & rhythm. No rubs, gallops or murmurs. Lungs: clear Abdomen: soft, nontender, nondistended. No hepatosplenomegaly. No bruits or masses. Good bowel sounds. Extremities: no cyanosis, clubbing, rash, edema Neuro: alert & orientedx3, cranial nerves grossly intact. moves all 4 extremities w/o difficulty. Affect pleasant   Telemetry  BIV pacing.      Labs    CBC Recent Labs    07/23/20 0444 07/24/20 0012  WBC 5.7 6.7  HGB 11.7* 11.4*  HCT 35.3* 34.7*  MCV 84.4 86.1  PLT 141* 696*   Basic Metabolic Panel Recent Labs    07/23/20 0444 07/24/20 0012  NA 130* 129*  K 3.6 3.6  CL 97* 98  CO2 24 23  GLUCOSE 97 154*  BUN 18 16  CREATININE 1.13* 0.93  CALCIUM 8.8* 8.4*   Liver Function Tests Recent Labs    07/23/20 0444 07/24/20 0012  AST 69* 49*  ALT 77* 55*  ALKPHOS 97 85   BILITOT 1.9* 1.6*  PROT 6.3* 5.9*  ALBUMIN 2.8* 2.7*   No results for input(s): LIPASE, AMYLASE in the last 72 hours. Cardiac Enzymes No results for input(s): CKTOTAL, CKMB, CKMBINDEX, TROPONINI in the last 72 hours.  BNP: BNP (last 3 results) Recent Labs    07/18/20 2026  BNP 2,412.6*    ProBNP (last 3 results) No results for input(s): PROBNP in the last 8760 hours.   D-Dimer No results for input(s): DDIMER in the last 72 hours. Hemoglobin A1C No results for input(s): HGBA1C in the last 72 hours. Fasting Lipid Panel No results for input(s): CHOL, HDL, LDLCALC, TRIG, CHOLHDL, LDLDIRECT in the last 72 hours. Thyroid Function Tests No results for input(s): TSH, T4TOTAL, T3FREE, THYROIDAB in the last 72 hours.  Invalid input(s): FREET3  Other results:   Imaging    CARDIAC CATHETERIZATION  Result Date: 07/23/2020 Successful transcatheter edge to edge mitral valve repair with a MitraClip G4 NTW device positioned A2/P2, reducing baseline mitral regurgitation from 4+ to 2+ post-procedure Plan:  Resume apixaban 2.5 mg BID tonight  POD #1 echo tomorrow  CHF management per Advance HF team  ECHO TEE  Result Date: 07/23/2020    TRANSESOPHOGEAL ECHO REPORT   Patient Name:   Cynthia Frank [facility] Date of  Exam: 07/23/2020 Medical Rec #:  [id number]          Height:       64.0 in Accession #:    [id number]         Weight:       115.4 lb Date of Birth:  1936-04-12          BSA:          1.549 m Patient Age:    34 years           BP:           104/82 mmHg Patient Gender: F                  HR:           97 bpm. Exam Location:  Inpatient Procedure: Transesophageal Echo, Color Doppler, Cardiac Doppler and 3D Echo Indications:    I34.9* Nonrheumatic mitral valve disorder, unspecified  History:        Patient has prior history of Echocardiogram examinations, most                 recent 07/15/2020. CHF, CAD, Arrythmias:PVC; Risk                 Factors:Hypertension, Dyslipidemia and Diabetes. GERD.  Thyroid                 disease.                  Mitral Valve: NTW Mitral Clip valve is present in the mitral                 position. Procedure Date: 07/23/2020.  Sonographer:    Darlina Sicilian RDCS Referring Phys: [id number] Woodfin Ganja THOMPSON PROCEDURE: After discussion of the risks and benefits of a TEE, an informed consent was obtained from the patient. The patient was intubated. The transesophogeal probe was passed without difficulty through the esophogus of the patient. Imaged were obtained with the patient in a supine position. Sedation performed by different physician. The patient was monitored while under deep sedation., 60mg  of Lidocaine. Image quality was good. The patient's vital signs; including heart rate, blood pressure, and oxygen saturation; remained stable throughout the procedure. The patient developed no complications during the procedure. PRE-PROCEDURE EXAMINATION Dilated left ventricle with adverse spherical remodeling and severely depressed systolic function. LVEF 25%. There is poor mitral leaflet coaptation and severe central mitral insufficency with systolic pulmonary flow reversal. the effective regurgitant orifice area is 0.7 cm sq, regurgitant volume 72 ml, regurgitant fraction 72%. Mean diastolic gradient 2 mm Hg. There is a small iatrogenic secundum ASD from recent EP transseptal procedure, with exclusively left to right shunting. There is no pericardial effusion. POST-PROCEDURE EXAMINATION TEE with 3D imaging and postprocessing was used for guiding of transseptal puncture, device deployment and final result assessment. Left ventricular function remains severely depresed, LVEF unchanged 35%. The NTW MitraClip is well seated in a stable position with good tissue bridge. There is mild-to-moderate residual mitral insufficiency immediately medial to the clip. Systolic pulmonary vein flow reversal is no longer seen. There is no pericardial effusion. Small iatrogenic ASD with exclusively left  to right shunting.  IMPRESSIONS  1. Left ventricular ejection fraction, by estimation, is 20 to 25%. The left ventricle has severely decreased function. The left ventricle demonstrates global hypokinesis. The left ventricular internal cavity size was moderately dilated. Left ventricular diastolic function could not be evaluated.  2. Right ventricular systolic function is  mildly reduced. The right ventricular size is normal.  3. Left atrial size was severely dilated. No left atrial/left atrial appendage thrombus was detected.  4. Right atrial size was mild to moderately dilated.  5. The mitral valve is abnormal. Severe mitral valve regurgitation. No evidence of mitral stenosis. There is a NTW Mitral Clip present in the mitral position. Procedure Date: 07/23/2020.  6. Tricuspid valve regurgitation is moderate.  7. The aortic valve is tricuspid. Aortic valve regurgitation is mild. No aortic stenosis is present.  8. Evidence of atrial level shunting detected by color flow Doppler. There is a small secundum atrial septal defect with predominantly left to right shunting across the atrial septum. FINDINGS  Left Ventricle: Left ventricular ejection fraction, by estimation, is 20 to 25%. The left ventricle has severely decreased function. The left ventricle demonstrates global hypokinesis. The left ventricular internal cavity size was moderately dilated. There is no left ventricular hypertrophy. Left ventricular diastolic function could not be evaluated due to mitral regurgitation (moderate or greater). Left ventricular diastolic function could not be evaluated. Right Ventricle: The right ventricular size is normal. No increase in right ventricular wall thickness. Right ventricular systolic function is mildly reduced. Left Atrium: Left atrial size was severely dilated. Spontaneous echo contrast was present in the left atrium. No left atrial/left atrial appendage thrombus was detected. Right Atrium: Right atrial size was mild  to moderately dilated. Pericardium: There is no evidence of pericardial effusion. Mitral Valve: There is marked mitral leaflet tenting/malcoaptation due to left ventricular remodeling and annular dilation. At baseline, the effective regurgitant orifice area is 0.7 cm sq, regurgitant volume 72 ml, regurgitant fraction 72%. The mitral valve is abnormal. Severe mitral valve regurgitation, with centrally-directed jet. There is a NTW Mitral Clip present in the mitral position. Procedure Date: 07/23/2020. No evidence of mitral valve stenosis. Pulmonary venous flow shows systolic flow reversal. Tricuspid Valve: The tricuspid valve is normal in structure. Tricuspid valve regurgitation is moderate. Aortic Valve: The aortic valve is tricuspid. Aortic valve regurgitation is mild. No aortic stenosis is present. Aortic valve mean gradient measures 1.0 mmHg. Aortic valve peak gradient measures 2.3 mmHg. Aortic valve area, by VTI measures 2.46 cm. Pulmonic Valve: The pulmonic valve was normal in structure. Pulmonic valve regurgitation is trivial. Aorta: The aortic root and ascending aorta are structurally normal, with no evidence of dilitation. IAS/Shunts: Evidence of atrial level shunting detected by color flow Doppler. There is a small secundum atrial septal defect with predominantly left to right shunting across the atrial septum. Additional Comments: A device lead is visualized in the superior vena cava, right atrium and right ventricle.  LEFT VENTRICLE PLAX 2D LVOT diam:     2.20 cm LV SV:         27 LV SV Index:   17 LVOT Area:     3.80 cm  AORTIC VALVE AV Area (Vmax):    2.38 cm AV Area (Vmean):   2.19 cm AV Area (VTI):     2.46 cm AV Vmax:           75.40 cm/s AV Vmean:          53.800 cm/s AV VTI:            0.109 m AV Peak Grad:      2.3 mmHg AV Mean Grad:      1.0 mmHg LVOT Vmax:         47.27 cm/s LVOT Vmean:        30.933 cm/s  LVOT VTI:          0.070 m LVOT/AV VTI ratio: 0.65 MR Peak grad:    60.2 mmHg MR Mean  grad:    37.5 mmHg   SHUNTS MR Vmax:         388.00 cm/s Systemic VTI:  0.07 m MR Vmean:        285.5 cm/s  Systemic Diam: 2.20 cm MR PISA:         2.26 cm MR PISA Eff ROA: 22 mm MR PISA Radius:  0.60 cm Dani Gobble Croitoru MD Electronically signed by Sanda Klein MD Signature Date/Time: 07/23/2020/4:04:58 PM    Final      Medications:     Scheduled Medications: . apixaban  2.5 mg Oral BID  . atorvastatin  40 mg Oral Daily  . digoxin  0.0625 mg Oral Daily  . ipratropium  1 spray Each Nare BID  . levothyroxine  25 mcg Oral QAC breakfast  . magnesium oxide  800 mg Oral BID  . mupirocin ointment  1 application Nasal BID  . potassium chloride SA  20 mEq Oral Daily  . sertraline  25 mg Oral Daily  . sodium chloride flush  3 mL Intravenous Q12H  . spironolactone  25 mg Oral QHS    Infusions: . sodium chloride      PRN Medications: sodium chloride, acetaminophen, clobetasol cream, conjugated estrogens, ketoconazole, ondansetron (ZOFRAN) IV, senna-docusate, sodium chloride flush, zolpidem    Assessment/Plan   1. Acute on chronic systolic CHF: EF 17-49% with diffuse hypokinesis on 12/16 echo. Nonischemic cardiomyopathy, probably related to Adriamycin with ovarian cancer treatment. Cardiac MRI in 1/17 showed EF 29% with normal RV and septal-lateral dyssynchrony. There was a non-cardiac pattern of LGE in the septum, cannot rule out prior myocarditis based on this pattern. She has a [employer] CRT-D device. Echo 4/19 with EF 30-35%, then echo in 8/20 showed EF up to 45-50%. TEE in 3/21 showed EF 30-35%. Echo in 11/21 showed EF 25-30% with mild LV dilation, global hypokinesis with septal-lateral dyssynchrony, moderate MR, moderately decreased RV systolic function with normal RV size, IVC normal.  TEE in 3/22 showed EF 20-25%, moderate LV dilation, moderately decreased RV systolic function, moderate TR, and severe MR with malcoaptation of P2/P3 scallops.  She was admitted with worsening  dyspnea/volume overload.  She has been in NSR while here. SBP generally 90s, this is where she runs at home. Good diuresis.  RHC yesterday showed optimized (to low) volume status with preserved cardiac output.  - Volume status stable. Tomorrow start lasix 40 mg daily.  - Continue to hold Toprol XL with soft BP.  - Continue spironolactone 25 mg daily. - She will stay off Entresto given syncope and lightheadedness while taking it.Will not start losartan with soft BP. - Hold Farxiga for now.  - Continue digoxin, low dose 0.0625. Dig level 0.6. Watch closely with CKD 3 and small size.  - Severe MR has worsened HF, S/P Mitra Clip.   - Not candidate for advanced therapies with age.  2. Atrial arrhythmias: She needs to maintain NSR as she loses BiV pacing when she goes into atrial arrhythmias, triggering CHF exacerbation. She had atrial fibrillation ablation in 4/21. She went into atrial flutter in 11/21, she had DCCV in 12/21, went back in atrial flutter on amiodarone.I think that her QT interval has been too long for Tikosyn. She had redo AF ablation in 3/22.  She is in NSR today.  -Amiodarone was stopped again with  rise in LFTs.  Follow for now, ?due to HF or due to amiodarone, may rechallenge in future.  - Continue apixaban 3. Hypothyroidism: Suspect related to prior amiodarone use.  - Continue Levoxyl.  4. Elevated LFTs: Mild elevation in past, resolved in 11/21. Abdominal US showed unremarkable liver. ?Related to amiodarone. She is now back on amiodarone and LFTs are increased again as above, ?due to amiodarone versus CHF.LFTs now downtrending  -continue to hold amiodarone for now.  5. CAD: Calcification noted in coronaries with pulmonary vein CT done prior to afib ablation.  - Continue atorvastatin 40 daily, good lipids in 8/21.  - No ASA given apixaban use.  6. Mitral regurgitation: Severe MR on TEE in 3/22.  Suspect atrial enlargement has stretched MV annulus, has P2-P3  malcoaptation. Suspect primarily functional.  MR has clearly worsened, suspect it is helping to drive CHF.  She is not a candidate for surgical MV repair.  RHC  after diuresis with optimized (to low) filling pressures.  In setting of low filling pressures, she does not have prominent v-waves on PCWP tracing.  - S/P 4/7/ 22 MitraClip  7. Hyponatremia: Hypervolemic hyponatremia.  Na 129 today.  8. Fe deficiency: Low transferrin saturation - received feraheme 4/4   We will set up follow up next week.   Length of Stay: [place], NP  07/24/2020, 8:18 AM  Advanced Heart Failure Team Pager [phone] (M-F; 7a - 5p)  Please contact [facility] for night-coverage after hours (5p -7a ) and weekends on [email or web address]  Patient seen with NP, agree with the above note.   She is doing well this morning after Mitraclip yesterday.  Breathing improved.  Wants to go home.   General: NAD Neck: No JVD, no thyromegaly or thyroid nodule.  Lungs: Clear to auscultation bilaterally with normal respiratory effort. CV: Nondisplaced PMI.  Heart regular S1/S2, no S3/S4.  I cannot hear MR murmur today!  No peripheral edema.  Abdomen: Soft, nontender, no hepatosplenomegaly, no distention.  Skin: Intact without lesions or rashes.  Neurologic: Alert and oriented x 3.  Psych: Normal affect. Extremities: No clubbing or cyanosis.  HEENT: Normal.   Patient looks euvolemic, I do not hear MR murmur.  MR was down to 2+ from 4+ after Mitraclip yesterday.   - Restart Lasix at 40 mg daily tomorrow.  - Restart dapagliflozin 10 mg daily today.  - Decrease digoxin to 0.0625 qod.   She remains in NSR.  Will leave off amiodarone due to elevated LFTs.   She can go home today.  Will need close followup.  Discussed home meds with NP.   [name] 07/24/2020 8:44 AM

## 2020-07-24 NOTE — Progress Notes (Signed)
  Echocardiogram 2D Echocardiogram with contrast has been performed.  Merrie Roof F 07/24/2020, 10:54 AM

## 2020-07-24 NOTE — Discharge Instructions (Signed)
Home Care Following Your MitraClip Procedure      If you have any questions or concerns you can call the structural heart office at [phone] during normal business hours 8am-4pm. If you have an urgent need after hours or on the weekend, please call [phone] to talk to the on call provider for general cardiology. If you have an emergency that requires immediate attention, please call 911.   Groin Site Care Refer to this sheet in the next few weeks. These instructions provide you with information on caring for yourself after your procedure. Your caregiver may also give you more specific instructions. Your treatment has been planned according to current medical practices, but problems sometimes occur. Call your caregiver if you have any problems or questions after your procedure. HOME CARE INSTRUCTIONS  You may shower 24 hours after the procedure. Remove the bandage (dressing) and gently wash the site with plain soap and water. Gently pat the site dry.   Do not apply powder or lotion to the site.   Do not sit in a bathtub, swimming pool, or whirlpool for 5 to 7 days.   No bending, squatting, or lifting anything over 10 pounds (4.5 kg) as directed by your caregiver.   Inspect the site at least twice daily.   Do not drive home if you are discharged the same day of the procedure. Have someone else drive you.   You may drive 72 hours after the procedure unless otherwise instructed by your caregiver.  What to expect:  Any bruising will usually fade within 1 to 2 weeks.   Blood that collects in the tissue (hematoma) may be painful to the touch. It should usually decrease in size and tenderness within 1 to 2 weeks.  SEEK IMMEDIATE MEDICAL CARE IF:  You have unusual pain at the groin site or down the affected leg.   You have redness, warmth, swelling, or pain at the groin site.   You have drainage (other than a small amount of blood on the dressing).   You have chills.   You have a  fever or persistent symptoms for more than 72 hours.   You have a fever and your symptoms suddenly get worse.   Your leg becomes pale, cool, tingly, or numb.   You have bleeding from the site. Hold pressure on the site until it subsides.    After MitraClip Checklist  Check  Test Description   Follow up appointment in 1-2 weeks  Most of our patients will see our structural heart physician assistant, Katie Thompson, or your primary cardiologist within 1-2 weeks. Your incision site will be checked and you will be cleared to resume all normal activities if you are doing well.     1 month echo and follow up  You will have an echo to check on your heart valve clip and be seen back in the office by Katie Thompson PA-C.   Follow up with your primary cardiologist You will need to be seen by your primary cardiologist in the following 3-6 months after your 1 month appointment in the valve clinic. Often times your Plavix or Aspirin will be discontinued during this time, but this is decided on a case by case basis.    1 year echo and follow up You will have another echo to check on your heart valve after one year and be seen back in the office by Katie Thompson. This your last structural heart visit.   Bacterial endocarditis prophylaxis  You will   have to take antibiotics for the rest of your life before all dental procedures (even dental cleanings) to protect your heart valve from potential infection. Antibiotics are also required before some surgeries. Please check with your cardiologist before scheduling any surgeries. Also, please make sure to tell us if you have a penicillin allergy as you will require an alternative antibiotic.    ______________  Your Implant Identification Card Following your procedure, you will receive an Implant Identification Card, which your doctor will fill out and which you must carry with you at all times. Show your Implant Identification Card if you report to an emergency room.  This card identifies you as a patient who has had a MitraClip device implanted. If you require a magnetic resonance imaging (MRI) scan, tell your doctor or MRI technician that you have a MitraClip device implanted. Test results indicate that patients with the MitraClip device can safely undergo MRI scans under certain conditions described on the card.  Information on my medicine - ELIQUIS (apixaban)  This medication education was reviewed with me or my healthcare representative as part of my discharge preparation.   Why was Eliquis prescribed for you? Eliquis was prescribed for you to reduce the risk of a blood clot forming that can cause a stroke if you have a medical condition called atrial fibrillation (a type of irregular heartbeat).  What do You need to know about Eliquis ? Take your Eliquis TWICE DAILY - one tablet in the morning and one tablet in the evening with or without food. If you have difficulty swallowing the tablet whole please discuss with your pharmacist how to take the medication safely.  Take Eliquis exactly as prescribed by your doctor and DO NOT stop taking Eliquis without talking to the doctor who prescribed the medication.  Stopping may increase your risk of developing a stroke.  Refill your prescription before you run out.  After discharge, you should have regular check-up appointments with your healthcare provider that is prescribing your Eliquis.  In the future your dose may need to be changed if your kidney function or weight changes by a significant amount or as you get older.  What do you do if you miss a dose? If you miss a dose, take it as soon as you remember on the same day and resume taking twice daily.  Do not take more than one dose of ELIQUIS at the same time to make up a missed dose.  Important Safety Information A possible side effect of Eliquis is bleeding. You should call your healthcare provider right away if you experience any of the  following: ? Bleeding from an injury or your nose that does not stop. ? Unusual colored urine (red or dark brown) or unusual colored stools (red or black). ? Unusual bruising for unknown reasons. ? A serious fall or if you hit your head (even if there is no bleeding).  Some medicines may interact with Eliquis and might increase your risk of bleeding or clotting while on Eliquis. To help avoid this, consult your healthcare provider or pharmacist prior to using any new prescription or non-prescription medications, including herbals, vitamins, non-steroidal anti-inflammatory drugs (NSAIDs) and supplements.  This website has more information on Eliquis (apixaban): http://www.eliquis.com/eliquis/home

## 2020-07-24 NOTE — Progress Notes (Signed)
RN removed IV and went over discharge information. Belongings with pt. Pt's husband to transport her home. NT transporting pt to private vehicle.

## 2020-07-24 NOTE — Anesthesia Postprocedure Evaluation (Signed)
Anesthesia Post Note  Patient: Cynthia Frank  Procedure(s) Performed: MITRAL VALVE REPAIR (N/A ) TRANSESOPHAGEAL ECHOCARDIOGRAM (TEE) (N/A )     Patient location during evaluation: PACU Anesthesia Type: General Level of consciousness: awake and alert Pain management: pain level controlled Vital Signs Assessment: post-procedure vital signs reviewed and stable Respiratory status: spontaneous breathing, nonlabored ventilation, respiratory function stable and patient connected to nasal cannula oxygen Cardiovascular status: blood pressure returned to baseline and stable Postop Assessment: no apparent nausea or vomiting Anesthetic complications: no   No complications documented.  Last Vitals:  Vitals:   07/24/20 0714 07/24/20 0934  BP: 101/67   Pulse: 64 70  Resp: 18   Temp: 36.4 C   SpO2:      Last Pain:  Vitals:   07/24/20 0911  TempSrc:   PainSc: 0-No pain                 [name]

## 2020-07-24 NOTE — Progress Notes (Signed)
  Progress Note   Date: 07/24/2020  Patient Name: Cynthia Frank        MRN#: [id number]  Clarification of diagnosis: CKD Stage IIIa.     CKD Stage IIIa  Tykesha Konicki NP-C  12:55 PM

## 2020-07-24 NOTE — Plan of Care (Signed)

## 2020-07-24 NOTE — Progress Notes (Signed)
Progress Note  Patient Name: Cynthia Frank Date of Encounter: 07/24/2020  [facility] Cardiologist: No primary care provider on file.   Subjective   Feels well this morning.  Eager to go home.  No chest pain or shortness of breath at rest.  Inpatient Medications    Scheduled Meds: . apixaban  2.5 mg Oral BID  . atorvastatin  40 mg Oral Daily  . digoxin  0.0625 mg Oral Daily  . ipratropium  1 spray Each Nare BID  . levothyroxine  25 mcg Oral QAC breakfast  . magnesium oxide  800 mg Oral BID  . mupirocin ointment  1 application Nasal BID  . potassium chloride SA  20 mEq Oral Daily  . sertraline  25 mg Oral Daily  . sodium chloride flush  3 mL Intravenous Q12H  . spironolactone  25 mg Oral QHS   Continuous Infusions: . sodium chloride     PRN Meds: sodium chloride, acetaminophen, clobetasol cream, conjugated estrogens, ketoconazole, ondansetron (ZOFRAN) IV, senna-docusate, sodium chloride flush, zolpidem   Vital Signs    Vitals:   07/23/20 1947 07/23/20 2319 07/24/20 0344 07/24/20 0714  BP: 90/64 100/67 90/64 101/67  Pulse: 72 75 65 64  Resp: 17 15 17 18   Temp: (!) 97.5 F (36.4 C) (!) 97.5 F (36.4 C) (!) 97.5 F (36.4 C) 97.6 F (36.4 C)  TempSrc: Oral Oral Oral Oral  SpO2: 95% 95% 95%   Weight:   54.3 kg   Height:        Intake/Output Summary (Last 24 hours) at 07/24/2020 0732 Last data filed at 07/23/2020 2319 Gross per 24 hour  Intake 1130 ml  Output --  Net 1130 ml   Last 3 Weights 07/24/2020 07/23/2020 07/22/2020  Weight (lbs) 119 lb 11.4 oz 115 lb 6.4 oz 118 lb 3.2 oz  Weight (kg) 54.3 kg 52.345 kg 53.615 kg      Telemetry    A sensed V paced rhythm- Personally Reviewed  ECG    A sensed V paced rhythm with prolonged AV conduction - Personally Reviewed  Physical Exam  Elderly woman in no distress GEN: No acute distress.   Neck: No JVD Cardiac: RRR, 2/6 holosystolic murmur at the left lower sternal border, reduced from  baseline Respiratory: Clear to auscultation bilaterally. GI: Soft, nontender, non-distended  MS: No edema; No deformity.  Right groin clear Neuro:  Nonfocal  Psych: Normal affect   Labs    High Sensitivity Troponin:   Recent Labs  Lab 07/18/20 2026 07/19/20 0035  TROPONINIHS 24* 25*      Chemistry Recent Labs  Lab 07/22/20 0403 07/22/20 1024 07/23/20 0444 07/24/20 0012  NA 131* 136  135 130* 129*  K 3.9 3.4*  3.4* 3.6 3.6  CL 97*  --  97* 98  CO2 24  --  24 23  GLUCOSE 97  --  97 154*  BUN 18  --  18 16  CREATININE 1.30*  --  1.13* 0.93  CALCIUM 8.8*  --  8.8* 8.4*  PROT 6.4*  --  6.3* 5.9*  ALBUMIN 2.9*  --  2.8* 2.7*  AST 104*  --  69* 49*  ALT 99*  --  77* 55*  ALKPHOS 97  --  97 85  BILITOT 2.0*  --  1.9* 1.6*  GFRNONAA 41*  --  49* >60  ANIONGAP 10  --  9 8     Hematology Recent Labs  Lab 07/22/20 0403 07/22/20 1024 07/23/20  0444 07/24/20 0012  WBC 6.4  --  5.7 6.7  RBC 4.20  --  4.18 4.03  HGB 11.9* 13.3  13.3 11.7* 11.4*  HCT 35.1* 39.0  39.0 35.3* 34.7*  MCV 83.6  --  84.4 86.1  MCH 28.3  --  28.0 28.3  MCHC 33.9  --  33.1 32.9  RDW 19.0*  --  19.2* 20.0*  PLT 142*  --  141* 136*    BNP Recent Labs  Lab 07/18/20 2026  BNP 2,412.6*     DDimer No results for input(s): DDIMER in the last 168 hours.   Radiology    CARDIAC CATHETERIZATION  Result Date: 07/23/2020 Successful transcatheter edge to edge mitral valve repair with a MitraClip G4 NTW device positioned A2/P2, reducing baseline mitral regurgitation from 4+ to 2+ post-procedure Plan:  Resume apixaban 2.5 mg BID tonight  POD #1 echo tomorrow  CHF management per [facility] team  CARDIAC CATHETERIZATION  Result Date: 07/22/2020 1. Low to normal filling pressures. 2. Preserved cardiac output. Filling pressures optimized after aggressive diuresis.  Will stop IV Lasix today and will give a small amt of NS with RA pressure 1.   ECHO TEE  Result Date: 07/23/2020    TRANSESOPHOGEAL  ECHO REPORT   Patient Name:   Cynthia Frank [facility] Date of Exam: 07/23/2020 Medical Rec #:  [id number]          Height:       64.0 in Accession #:    [id number]         Weight:       115.4 lb Date of Birth:  [birth year]          BSA:          1.549 m Patient Age:    85 years           BP:           104/82 mmHg Patient Gender: F                  HR:           97 bpm. Exam Location:  Inpatient Procedure: Transesophageal Echo, Color Doppler, Cardiac Doppler and 3D Echo Indications:    I34.9* Nonrheumatic mitral valve disorder, unspecified  History:        Patient has prior history of Echocardiogram examinations, most                 recent 07/15/2020. CHF, CAD, Arrythmias:PVC; Risk                 Factors:Hypertension, Dyslipidemia and Diabetes. GERD. Thyroid                 disease.                  Mitral Valve: NTW Mitral Clip valve is present in the mitral                 position. Procedure Date: 07/23/2020.  Sonographer:    Darlina Sicilian RDCS Referring Phys: [id number] Woodfin Ganja THOMPSON PROCEDURE: After discussion of the risks and benefits of a TEE, an informed consent was obtained from the patient. The patient was intubated. The transesophogeal probe was passed without difficulty through the esophogus of the patient. Imaged were obtained with the patient in a supine position. Sedation performed by different physician. The patient was monitored while under deep sedation., 60mg  of Lidocaine. Image quality was good. The patient's vital signs; including heart  rate, blood pressure, and oxygen saturation; remained stable throughout the procedure. The patient developed no complications during the procedure. PRE-PROCEDURE EXAMINATION Dilated left ventricle with adverse spherical remodeling and severely depressed systolic function. LVEF 25%. There is poor mitral leaflet coaptation and severe central mitral insufficency with systolic pulmonary flow reversal. the effective regurgitant orifice area is 0.7 cm sq, regurgitant volume  72 ml, regurgitant fraction 72%. Mean diastolic gradient 2 mm Hg. There is a small iatrogenic secundum ASD from recent EP transseptal procedure, with exclusively left to right shunting. There is no pericardial effusion. POST-PROCEDURE EXAMINATION TEE with 3D imaging and postprocessing was used for guiding of transseptal puncture, device deployment and final result assessment. Left ventricular function remains severely depresed, LVEF unchanged 35%. The NTW MitraClip is well seated in a stable position with good tissue bridge. There is mild-to-moderate residual mitral insufficiency immediately medial to the clip. Systolic pulmonary vein flow reversal is no longer seen. There is no pericardial effusion. Small iatrogenic ASD with exclusively left to right shunting.  IMPRESSIONS  1. Left ventricular ejection fraction, by estimation, is 20 to 25%. The left ventricle has severely decreased function. The left ventricle demonstrates global hypokinesis. The left ventricular internal cavity size was moderately dilated. Left ventricular diastolic function could not be evaluated.  2. Right ventricular systolic function is mildly reduced. The right ventricular size is normal.  3. Left atrial size was severely dilated. No left atrial/left atrial appendage thrombus was detected.  4. Right atrial size was mild to moderately dilated.  5. The mitral valve is abnormal. Severe mitral valve regurgitation. No evidence of mitral stenosis. There is a NTW Mitral Clip present in the mitral position. Procedure Date: 07/23/2020.  6. Tricuspid valve regurgitation is moderate.  7. The aortic valve is tricuspid. Aortic valve regurgitation is mild. No aortic stenosis is present.  8. Evidence of atrial level shunting detected by color flow Doppler. There is a small secundum atrial septal defect with predominantly left to right shunting across the atrial septum. FINDINGS  Left Ventricle: Left ventricular ejection fraction, by estimation, is 20 to  25%. The left ventricle has severely decreased function. The left ventricle demonstrates global hypokinesis. The left ventricular internal cavity size was moderately dilated. There is no left ventricular hypertrophy. Left ventricular diastolic function could not be evaluated due to mitral regurgitation (moderate or greater). Left ventricular diastolic function could not be evaluated. Right Ventricle: The right ventricular size is normal. No increase in right ventricular wall thickness. Right ventricular systolic function is mildly reduced. Left Atrium: Left atrial size was severely dilated. Spontaneous echo contrast was present in the left atrium. No left atrial/left atrial appendage thrombus was detected. Right Atrium: Right atrial size was mild to moderately dilated. Pericardium: There is no evidence of pericardial effusion. Mitral Valve: There is marked mitral leaflet tenting/malcoaptation due to left ventricular remodeling and annular dilation. At baseline, the effective regurgitant orifice area is 0.7 cm sq, regurgitant volume 72 ml, regurgitant fraction 72%. The mitral valve is abnormal. Severe mitral valve regurgitation, with centrally-directed jet. There is a NTW Mitral Clip present in the mitral position. Procedure Date: 07/23/2020. No evidence of mitral valve stenosis. Pulmonary venous flow shows systolic flow reversal. Tricuspid Valve: The tricuspid valve is normal in structure. Tricuspid valve regurgitation is moderate. Aortic Valve: The aortic valve is tricuspid. Aortic valve regurgitation is mild. No aortic stenosis is present. Aortic valve mean gradient measures 1.0 mmHg. Aortic valve peak gradient measures 2.3 mmHg. Aortic valve area, by VTI measures 2.46  cm. Pulmonic Valve: The pulmonic valve was normal in structure. Pulmonic valve regurgitation is trivial. Aorta: The aortic root and ascending aorta are structurally normal, with no evidence of dilitation. IAS/Shunts: Evidence of atrial level  shunting detected by color flow Doppler. There is a small secundum atrial septal defect with predominantly left to right shunting across the atrial septum. Additional Comments: A device lead is visualized in the superior vena cava, right atrium and right ventricle.  LEFT VENTRICLE PLAX 2D LVOT diam:     2.20 cm LV SV:         27 LV SV Index:   17 LVOT Area:     3.80 cm  AORTIC VALVE AV Area (Vmax):    2.38 cm AV Area (Vmean):   2.19 cm AV Area (VTI):     2.46 cm AV Vmax:           75.40 cm/s AV Vmean:          53.800 cm/s AV VTI:            0.109 m AV Peak Grad:      2.3 mmHg AV Mean Grad:      1.0 mmHg LVOT Vmax:         47.27 cm/s LVOT Vmean:        30.933 cm/s LVOT VTI:          0.070 m LVOT/AV VTI ratio: 0.65 MR Peak grad:    60.2 mmHg MR Mean grad:    37.5 mmHg   SHUNTS MR Vmax:         388.00 cm/s Systemic VTI:  0.07 m MR Vmean:        285.5 cm/s  Systemic Diam: 2.20 cm MR PISA:         2.26 cm MR PISA Eff ROA: 22 mm MR PISA Radius:  0.60 cm Mihai Croitoru MD Electronically signed by Sanda Klein MD Signature Date/Time: 07/23/2020/4:04:58 PM    Final     Cardiac Studies   2D echocardiogram (postoperative day #1 study) currently pending  Patient Profile     85 y.o. female with longstanding chronic systolic heart failure secondary to nonischemic cardiomyopathy who has developed severe mitral regurgitation.  Because of progressive symptoms, inability to titrate medical therapy in the setting of low blood pressure, and worsening mitral regurgitation, she is treated with transcatheter edge-to-edge mitral valve repair 07/24/2020.  Assessment & Plan    Severe, nonrheumatic mitral regurgitation: Status post transcatheter edge-to-edge mitral valve repair (MitraClip) 07/23/2020 with good reduction in mitral regurgitation noted.  Patient restarted on apixaban.  Would avoid aspirin due to excessive bleeding risk in this elderly woman.  Review postoperative day #1 echo when completed.  Otherwise management  per advanced heart failure team.  Will arrange outpatient follow-up in structural heart valve clinic.      For questions or updates, please contact [place] Please consult [email or web address] for contact info under        Signed, Sherren Mocha, MD  07/24/2020, 7:32 AM

## 2020-07-24 NOTE — Discharge Summary (Addendum)
Advanced Heart Failure Team  Discharge Summary   Patient ID: Cynthia Frank MRN: [id number], DOB/AGE: Nov 03, 1935 85 y.o. Admit date: 07/18/2020 D/C date:     07/24/2020   Primary Discharge Diagnoses:  1.Acute on chronic systolic CHF:  2. Atrial arrhythmias 3. Hypothyroidism 4. Elevated LFTs 5. CAD 6. Mitral regurgitation: Severe MR on TEE in 3/22.  7. Hyponatremia 8. Fe deficiency 9. CKD Stage IIIa  Hospital Course: Cynthia Frank a 85 y.o.femalewho has a history of chronic systolic CHF/nonischemic cardiomyopathy, paroxysmal atrial fibrillation, and prior ovarian and colon cancers. She has had a cardiomyopathy known for >15 years now. Cardiac cath at diagnosis showed mild nonobstructive disease.   She went into atrial flutter in 11/21 and had DCCV back to NSR in 1/22. She stayed in NSR for a few weeks but is back in atrial fibrillation/flutter again. She had COVID-19 in 2/22.   Given persistent atrial fibrillation, she was referred to EP and underwent repeat afib ablation on 07/15/20. TEE was performed and showed severe MR (worse from prior study). LVEF was 20-25%. RV was moderately reduced. She was discharged home on 4/1 but didn't feel well post procedure.   Readmitted with volume overload. Diuresed with IV lasix and once optimized structural heart team evaluated for mitral clip. On 4/7 she underwent mitral clip.   HF meds adjusted. BB and Bertell Maria stopped due to hypotension.   See below for detailed problem list.   1.Acute on chronic systolic CHF: EF 35-32% with diffuse hypokinesis on 12/16 echo. Nonischemic cardiomyopathy, probably related to Adriamycin with ovarian cancer treatment. Cardiac MRI in 1/17 showed EF 29% with normal RV and septal-lateral dyssynchrony. There was a non-cardiac pattern of LGE in the septum, cannot rule out prior myocarditis based on this pattern. She has a [employer] CRT-D device. Echo 4/19 with EF 30-35%, then echo in 8/20 showed EF up to  45-50%. TEE in 3/21 showed EF 30-35%. Echo in 11/21 showed EF 25-30% with mild LV dilation, global hypokinesis with septal-lateral dyssynchrony, moderate MR, moderately decreased RV systolic function with normal RV size, IVC normal.TEE in 3/22 showed EF 20-25%, moderate LV dilation, moderately decreased RV systolic function, moderate TR, and severe MR with malcoaptation of P2/P3 scallops. She was admitted with worsening dyspnea/volume overload. She has been in NSR while here. SBP generally 90s, this is where she runs at home. RHC  showed optimized (to low) volume status with preserved cardiac output.  - Diuresed with IV lasix. Tomorrow will start lasix 40 mg  daily.  -Continue to hold Toprol XL with soft BP. - Continue spironolactone 25 mg daily. - She will stay off Entresto given syncope and lightheadedness while taking it.Will not start losartan with soft BP. -Wilder Glade was started on the day of discharge.   - Continue digoxin, low dose 0.0625. Dig level 0.6. Watch closely with CKD 3 and small size. -Severe MR has worsened HF, S/P Mitra Clip 07/23/20 - Not candidate for advanced therapies with age.  2. Atrial arrhythmias: She needs to maintain NSR as she loses BiV pacing when she goes into atrial arrhythmias, triggering CHF exacerbation. She had atrial fibrillation ablation in 4/21. She went into atrial flutter in 11/21, she had DCCV in 12/21,wentback in atrial flutter on amiodarone.I think that her QT interval has been too long for Tikosyn.She had redo AF ablation in 3/22. She is in NSR today.  -Amiodarone was stopped again with rise in LFTs. Follow for now, ?due to HF or due to amiodarone, may rechallenge in  future.  - Continue apixaban 3. Hypothyroidism: Suspect related to prior amiodarone use.  - Continue Levoxyl.  4. Elevated LFTs: Mild elevation in past, resolved in 11/21. Abdominal US showed unremarkable liver. ?Related to amiodarone. She is now back on  amiodaroneand LFTs are increased again as above, ?due to amiodarone versus CHF.LFTs now downtrending  -continue to hold amiodarone for now. 5. CAD: Calcification noted in coronaries with pulmonary vein CT done prior to afib ablation.  - Continue atorvastatin 40 daily, good lipids in 8/21.  - No ASA given apixaban use. 6. Mitral regurgitation: Severe MR on TEE in 3/22. Suspect atrial enlargement has stretched MV annulus, has P2-P3 malcoaptation. Suspect primarily functional. MR has clearly worsened, suspect it is helping to drive CHF. She is not a candidate for surgical MV repair.  RHC  after diuresis with optimized (to low) filling pressures.  In setting of low filling pressures, she does not have prominent v-waves on PCWP tracing.  - S/P 4/7/ 22 MitraClip  7. Hyponatremia: Hypervolemic hyponatremia. Na 129 today.  8. Fe deficiency: Low transferrin saturation - received feraheme 4/4  9. CKD Stage IIIa  Discharge Vitals: Blood pressure 97/68, pulse 61, temperature 97.6 F (36.4 C), temperature source Oral, resp. rate 17, height 5\' 4"  (1.626 m), weight 54.3 kg, SpO2 96 %.  Labs: Lab Results  Component Value Date   WBC 6.7 07/24/2020   HGB 11.4 (L) 07/24/2020   HCT 34.7 (L) 07/24/2020   MCV 86.1 07/24/2020   PLT 136 (L) 07/24/2020    Recent Labs  Lab 07/24/20 0012  NA 129*  K 3.6  CL 98  CO2 23  BUN 16  CREATININE 0.93  CALCIUM 8.4*  PROT 5.9*  BILITOT 1.6*  ALKPHOS 85  ALT 55*  AST 49*  GLUCOSE 154*   Lab Results  Component Value Date   CHOL 150 12/12/2019   HDL 86 12/12/2019   LDLCALC 52 12/12/2019   TRIG 60 12/12/2019   BNP (last 3 results) Recent Labs    07/18/20 2026  BNP 2,412.6*    ProBNP (last 3 results) No results for input(s): PROBNP in the last 8760 hours.   Diagnostic Studies/Procedures   CARDIAC CATHETERIZATION  Result Date: 07/23/2020 Successful transcatheter edge to edge mitral valve repair with a MitraClip G4 NTW device positioned  A2/P2, reducing baseline mitral regurgitation from 4+ to 2+ post-procedure Plan:  Resume apixaban 2.5 mg BID tonight  POD #1 echo tomorrow  CHF management per Advance HF team  ECHO TEE  Result Date: 07/23/2020    TRANSESOPHOGEAL ECHO REPORT   Patient Name:   Cynthia Frank [facility] Date of Exam: 07/23/2020 Medical Rec #:  [id number]          Height:       64.0 in Accession #:    [id number]         Weight:       115.4 lb Date of Birth:  1935-08-18          BSA:          1.549 m Patient Age:    89 years           BP:           104/82 mmHg Patient Gender: F                  HR:           97 bpm. Exam Location:  Inpatient Procedure: Transesophageal Echo, Color Doppler, Cardiac Doppler and  3D Echo Indications:    I34.9* Nonrheumatic mitral valve disorder, unspecified  History:        Patient has prior history of Echocardiogram examinations, most                 recent 07/15/2020. CHF, CAD, Arrythmias:PVC; Risk                 Factors:Hypertension, Dyslipidemia and Diabetes. GERD. Thyroid                 disease.                  Mitral Valve: NTW Mitral Clip valve is present in the mitral                 position. Procedure Date: 07/23/2020.  Sonographer:    Darlina Sicilian RDCS Referring Phys: [id number] Woodfin Ganja THOMPSON PROCEDURE: After discussion of the risks and benefits of a TEE, an informed consent was obtained from the patient. The patient was intubated. The transesophogeal probe was passed without difficulty through the esophogus of the patient. Imaged were obtained with the patient in a supine position. Sedation performed by different physician. The patient was monitored while under deep sedation., 60mg  of Lidocaine. Image quality was good. The patient's vital signs; including heart rate, blood pressure, and oxygen saturation; remained stable throughout the procedure. The patient developed no complications during the procedure. PRE-PROCEDURE EXAMINATION Dilated left ventricle with adverse spherical remodeling and  severely depressed systolic function. LVEF 25%. There is poor mitral leaflet coaptation and severe central mitral insufficency with systolic pulmonary flow reversal. the effective regurgitant orifice area is 0.7 cm sq, regurgitant volume 72 ml, regurgitant fraction 72%. Mean diastolic gradient 2 mm Hg. There is a small iatrogenic secundum ASD from recent EP transseptal procedure, with exclusively left to right shunting. There is no pericardial effusion. POST-PROCEDURE EXAMINATION TEE with 3D imaging and postprocessing was used for guiding of transseptal puncture, device deployment and final result assessment. Left ventricular function remains severely depresed, LVEF unchanged 35%. The NTW MitraClip is well seated in a stable position with good tissue bridge. There is mild-to-moderate residual mitral insufficiency immediately medial to the clip. Systolic pulmonary vein flow reversal is no longer seen. There is no pericardial effusion. Small iatrogenic ASD with exclusively left to right shunting.  IMPRESSIONS  1. Left ventricular ejection fraction, by estimation, is 20 to 25%. The left ventricle has severely decreased function. The left ventricle demonstrates global hypokinesis. The left ventricular internal cavity size was moderately dilated. Left ventricular diastolic function could not be evaluated.  2. Right ventricular systolic function is mildly reduced. The right ventricular size is normal.  3. Left atrial size was severely dilated. No left atrial/left atrial appendage thrombus was detected.  4. Right atrial size was mild to moderately dilated.  5. The mitral valve is abnormal. Severe mitral valve regurgitation. No evidence of mitral stenosis. There is a NTW Mitral Clip present in the mitral position. Procedure Date: 07/23/2020.  6. Tricuspid valve regurgitation is moderate.  7. The aortic valve is tricuspid. Aortic valve regurgitation is mild. No aortic stenosis is present.  8. Evidence of atrial level  shunting detected by color flow Doppler. There is a small secundum atrial septal defect with predominantly left to right shunting across the atrial septum. FINDINGS  Left Ventricle: Left ventricular ejection fraction, by estimation, is 20 to 25%. The left ventricle has severely decreased function. The left ventricle demonstrates global hypokinesis. The left ventricular internal  cavity size was moderately dilated. There is no left ventricular hypertrophy. Left ventricular diastolic function could not be evaluated due to mitral regurgitation (moderate or greater). Left ventricular diastolic function could not be evaluated. Right Ventricle: The right ventricular size is normal. No increase in right ventricular wall thickness. Right ventricular systolic function is mildly reduced. Left Atrium: Left atrial size was severely dilated. Spontaneous echo contrast was present in the left atrium. No left atrial/left atrial appendage thrombus was detected. Right Atrium: Right atrial size was mild to moderately dilated. Pericardium: There is no evidence of pericardial effusion. Mitral Valve: There is marked mitral leaflet tenting/malcoaptation due to left ventricular remodeling and annular dilation. At baseline, the effective regurgitant orifice area is 0.7 cm sq, regurgitant volume 72 ml, regurgitant fraction 72%. The mitral valve is abnormal. Severe mitral valve regurgitation, with centrally-directed jet. There is a NTW Mitral Clip present in the mitral position. Procedure Date: 07/23/2020. No evidence of mitral valve stenosis. Pulmonary venous flow shows systolic flow reversal. Tricuspid Valve: The tricuspid valve is normal in structure. Tricuspid valve regurgitation is moderate. Aortic Valve: The aortic valve is tricuspid. Aortic valve regurgitation is mild. No aortic stenosis is present. Aortic valve mean gradient measures 1.0 mmHg. Aortic valve peak gradient measures 2.3 mmHg. Aortic valve area, by VTI measures 2.46 cm.  Pulmonic Valve: The pulmonic valve was normal in structure. Pulmonic valve regurgitation is trivial. Aorta: The aortic root and ascending aorta are structurally normal, with no evidence of dilitation. IAS/Shunts: Evidence of atrial level shunting detected by color flow Doppler. There is a small secundum atrial septal defect with predominantly left to right shunting across the atrial septum. Additional Comments: A device lead is visualized in the superior vena cava, right atrium and right ventricle.  LEFT VENTRICLE PLAX 2D LVOT diam:     2.20 cm LV SV:         27 LV SV Index:   17 LVOT Area:     3.80 cm  AORTIC VALVE AV Area (Vmax):    2.38 cm AV Area (Vmean):   2.19 cm AV Area (VTI):     2.46 cm AV Vmax:           75.40 cm/s AV Vmean:          53.800 cm/s AV VTI:            0.109 m AV Peak Grad:      2.3 mmHg AV Mean Grad:      1.0 mmHg LVOT Vmax:         47.27 cm/s LVOT Vmean:        30.933 cm/s LVOT VTI:          0.070 m LVOT/AV VTI ratio: 0.65 MR Peak grad:    60.2 mmHg MR Mean grad:    37.5 mmHg   SHUNTS MR Vmax:         388.00 cm/s Systemic VTI:  0.07 m MR Vmean:        285.5 cm/s  Systemic Diam: 2.20 cm MR PISA:         2.26 cm MR PISA Eff ROA: 22 mm MR PISA Radius:  0.60 cm Dani Gobble Croitoru MD Electronically signed by Sanda Klein MD Signature Date/Time: 07/23/2020/4:04:58 PM    Final     Discharge Medications   Allergies as of 07/24/2020      Reactions   Clindamycin/lincomycin Itching   Dramamine Ii [meclizine Hcl] Other (See Comments)   hyper   Pneumococcal Vaccines Itching,  Swelling   Redness   Sulfonamide Derivatives Itching, Swelling      Medication List    STOP taking these medications   amiodarone 200 MG tablet Commonly known as: PACERONE   metoprolol succinate 25 MG 24 hr tablet Commonly known as: TOPROL-XL     TAKE these medications   acetaminophen 500 MG tablet Commonly known as: TYLENOL Take 500 mg by mouth every 6 (six) hours as needed for moderate pain or headache.    atorvastatin 40 MG tablet Commonly known as: LIPITOR TAKE 1 TABLET(40 MG) BY MOUTH DAILY AT 6 PM What changed: See the new instructions.   clobetasol 0.05 % external solution Commonly known as: TEMOVATE Apply 1 application topically 2 (two) times daily.   conjugated estrogens vaginal cream Commonly known as: PREMARIN Place 1 Applicatorful vaginally daily as needed (dyspareunia (Use as directed)).   CoQ10 100 MG Caps Take 100 mg by mouth daily.   dapagliflozin propanediol 10 MG Tabs tablet Commonly known as: Farxiga Take 1 tablet (10 mg total) by mouth daily before breakfast.   diclofenac Sodium 1 % Gel Commonly known as: VOLTAREN Apply 2 g topically daily.   Digoxin 62.5 MCG Tabs Take 0.0625 mg by mouth every other day.   Eliquis 2.5 MG Tabs tablet Generic drug: apixaban TAKE 1 TABLET(2.5 MG) BY MOUTH TWICE DAILY What changed: See the new instructions.   furosemide 40 MG tablet Commonly known as: LASIX Take 1 tablet (40 mg total) by mouth daily. Start taking on: July 25, 2020 What changed: when to take this   ipratropium 0.03 % nasal spray Commonly known as: ATROVENT Place 1 spray into both nostrils 2 (two) times daily.   ketoconazole 2 % shampoo Commonly known as: NIZORAL Apply 1 application topically 3 (three) times a week.   levothyroxine 25 MCG tablet Commonly known as: SYNTHROID TAKE 1 TABLET(25 MCG) BY MOUTH DAILY BEFORE BREAKFAST What changed: See the new instructions.   Magnesium Oxide 500 MG Caps Take 1,000 mg by mouth 2 (two) times daily.   mupirocin ointment 2 % Commonly known as: BACTROBAN Place 1 application into the nose 2 (two) times daily. Start taking on: July 27, 2020   potassium chloride SA 20 MEQ tablet Commonly known as: KLOR-CON Take 1 tablet (20 mEq total) by mouth daily.   sertraline 25 MG tablet Commonly known as: ZOLOFT TAKE 1 TABLET(25 MG) BY MOUTH DAILY What changed: See the new instructions.   spironolactone 25 MG  tablet Commonly known as: ALDACTONE TAKE 1 TABLET(25 MG) BY MOUTH AT BEDTIME What changed: See the new instructions.   zolpidem 5 MG tablet Commonly known as: AMBIEN Take 5 mg by mouth at bedtime as needed for sleep.       Disposition   The patient will be discharged in stable condition to home. Discharge Instructions    (HEART FAILURE PATIENTS) Call MD:  Anytime you have any of the following symptoms: 1) 3 pound weight gain in 24 hours or 5 pounds in 1 week 2) shortness of breath, with or without a dry hacking cough 3) swelling in the hands, feet or stomach 4) if you have to sleep on extra pillows at night in order to breathe.   Complete by: As directed    Diet - low sodium heart healthy   Complete by: As directed    Heart Failure patients record your daily weight using the same scale at the same time of day   Complete by: As directed  Increase activity slowly   Complete by: As directed       Follow-up Information    Larey Dresser, MD Follow up on 07/31/2020.   Specialty: Cardiology Why: at 1030. Garage Code 2231.  Contact information: [address] [place] [address] [phone]        Eileen Stanford, PA-C. Go on 08/27/2020.   Specialties: Cardiology, Radiology Why: arrive at 2:45pm for your echo followed by a visit with Nell Range PA-C Contact information: [place] [phone]                 Duration of Discharge Encounter: Greater than 35 minutes   Signed, Delvonte Berenson NP-C  07/24/2020, 12:08 PM

## 2020-07-24 NOTE — Progress Notes (Signed)
CARDIAC REHAB PHASE I   PRE:  Rate/Rhythm: 71 paced    BP: sitting 112/79    SaO2: 94 RA  MODE:  Ambulation: 300 ft   POST:  Rate/Rhythm: 92 pacing    BP: sitting 121/82     SaO2: 97 RA  Ambulated with gait belt, light support. Pt likes to hold to wall, furniture. Fairly steady, no LOB. She has RW. SOB much improved, only c/o slight SOB. To recliner. Encouraged increasing walking at home. Not interested in CRPII.  [place], ACSM 07/24/2020 10:10 AM

## 2020-07-27 ENCOUNTER — Other Ambulatory Visit: Payer: Self-pay

## 2020-07-27 ENCOUNTER — Encounter (HOSPITAL_COMMUNITY): Payer: Self-pay

## 2020-07-27 ENCOUNTER — Ambulatory Visit (HOSPITAL_COMMUNITY)
Admission: RE | Admit: 2020-07-27 | Discharge: 2020-07-27 | Disposition: A | Discharge status: Home / Self Care | Payer: Medicare Other | Source: Ambulatory Visit | Attending: Cardiology | Admitting: Cardiology

## 2020-07-27 ENCOUNTER — Other Ambulatory Visit (HOSPITAL_COMMUNITY): Payer: Self-pay

## 2020-07-27 VITALS — BP 112/78 | HR 81 | Wt 120.0 lb

## 2020-07-27 DIAGNOSIS — I5022 Chronic systolic (congestive) heart failure: Secondary | ICD-10-CM | POA: Diagnosis not present

## 2020-07-27 DIAGNOSIS — Z79899 Other long term (current) drug therapy: Secondary | ICD-10-CM | POA: Insufficient documentation

## 2020-07-27 DIAGNOSIS — I110 Hypertensive heart disease with heart failure: Secondary | ICD-10-CM | POA: Diagnosis not present

## 2020-07-27 DIAGNOSIS — Z9889 Other specified postprocedural states: Secondary | ICD-10-CM

## 2020-07-27 DIAGNOSIS — Z87891 Personal history of nicotine dependence: Secondary | ICD-10-CM | POA: Diagnosis not present

## 2020-07-27 DIAGNOSIS — E871 Hypo-osmolality and hyponatremia: Secondary | ICD-10-CM | POA: Diagnosis not present

## 2020-07-27 DIAGNOSIS — Z8249 Family history of ischemic heart disease and other diseases of the circulatory system: Secondary | ICD-10-CM | POA: Diagnosis not present

## 2020-07-27 DIAGNOSIS — I2510 Atherosclerotic heart disease of native coronary artery without angina pectoris: Secondary | ICD-10-CM | POA: Diagnosis not present

## 2020-07-27 DIAGNOSIS — Z8543 Personal history of malignant neoplasm of ovary: Secondary | ICD-10-CM | POA: Insufficient documentation

## 2020-07-27 DIAGNOSIS — I340 Nonrheumatic mitral (valve) insufficiency: Secondary | ICD-10-CM | POA: Diagnosis not present

## 2020-07-27 DIAGNOSIS — Z7984 Long term (current) use of oral hypoglycemic drugs: Secondary | ICD-10-CM | POA: Insufficient documentation

## 2020-07-27 DIAGNOSIS — E039 Hypothyroidism, unspecified: Secondary | ICD-10-CM | POA: Diagnosis not present

## 2020-07-27 DIAGNOSIS — Z7901 Long term (current) use of anticoagulants: Secondary | ICD-10-CM | POA: Insufficient documentation

## 2020-07-27 DIAGNOSIS — I480 Paroxysmal atrial fibrillation: Secondary | ICD-10-CM | POA: Insufficient documentation

## 2020-07-27 DIAGNOSIS — I428 Other cardiomyopathies: Secondary | ICD-10-CM | POA: Insufficient documentation

## 2020-07-27 DIAGNOSIS — Z791 Long term (current) use of non-steroidal anti-inflammatories (NSAID): Secondary | ICD-10-CM | POA: Diagnosis not present

## 2020-07-27 DIAGNOSIS — Z95818 Presence of other cardiac implants and grafts: Secondary | ICD-10-CM

## 2020-07-27 LAB — CBC
HCT: 39.3 % (ref 36.0–46.0)
Hemoglobin: 12.2 g/dL (ref 12.0–15.0)
MCH: 27.8 pg (ref 26.0–34.0)
MCHC: 31 g/dL (ref 30.0–36.0)
MCV: 89.5 fL (ref 80.0–100.0)
Platelets: 155 10*3/uL (ref 150–400)
RBC: 4.39 MIL/uL (ref 3.87–5.11)
RDW: 21.2 % — ABNORMAL HIGH (ref 11.5–15.5)
WBC: 6.5 10*3/uL (ref 4.0–10.5)
nRBC: 0 % (ref 0.0–0.2)

## 2020-07-27 LAB — COMPREHENSIVE METABOLIC PANEL
ALT: 50 U/L — ABNORMAL HIGH (ref 0–44)
AST: 68 U/L — ABNORMAL HIGH (ref 15–41)
Albumin: 3.3 g/dL — ABNORMAL LOW (ref 3.5–5.0)
Alkaline Phosphatase: 106 U/L (ref 38–126)
Anion gap: 9 (ref 5–15)
BUN: 18 mg/dL (ref 8–23)
CO2: 25 mmol/L (ref 22–32)
Calcium: 9.3 mg/dL (ref 8.9–10.3)
Chloride: 93 mmol/L — ABNORMAL LOW (ref 98–111)
Creatinine, Ser: 1.19 mg/dL — ABNORMAL HIGH (ref 0.44–1.00)
GFR, Estimated: 46 mL/min — ABNORMAL LOW (ref >60)
Glucose, Bld: 93 mg/dL (ref 70–99)
Potassium: 5.1 mmol/L (ref 3.5–5.1)
Sodium: 127 mmol/L — ABNORMAL LOW (ref 135–145)
Total Bilirubin: 1.9 mg/dL — ABNORMAL HIGH (ref 0.3–1.2)
Total Protein: 7.1 g/dL (ref 6.5–8.1)

## 2020-07-27 LAB — DIGOXIN LEVEL: Digoxin Level: 0.3 ng/mL — ABNORMAL LOW (ref 0.8–2.0)

## 2020-07-27 LAB — BRAIN NATRIURETIC PEPTIDE: B Natriuretic Peptide: 925.9 pg/mL — ABNORMAL HIGH (ref 0.0–100.0)

## 2020-07-27 NOTE — Patient Instructions (Signed)
It was great to see you today! No medication changes are needed at this time.  Labs today We will only contact you if something comes back abnormal or we need to make some changes. Otherwise no news is good news!  Labs needed in 2 weeks (08/09/20 visit)   Your physician recommends that you schedule a follow-up appointment in: 6 weeks with Dr Aundra Dubin  Do the following things EVERYDAY: 1) Weigh yourself in the morning before breakfast. Write it down and keep it in a log. 2) Take your medicines as prescribed 3) Eat low salt foods--Limit salt (sodium) to 2000 mg per day.  4) Stay as active as you can everyday 5) Limit all fluids for the day to less than 2 liters  At the [facility], you and your health needs are our priority. As part of our continuing mission to provide you with exceptional heart care, we have created designated Provider Care Teams. These Care Teams include your primary Cardiologist (physician) and Advanced Practice Providers (APPs- Physician Assistants and Nurse Practitioners) who all work together to provide you with the care you need, when you need it.   You may see any of the following providers on your designated Care Team at your next follow up: Marland Kitchen Dr Glori Bickers . Dr [name] . Dr Vickki Muff . Darrick Grinder, NP . Lyda Jester, [place] . Audry Riles, PharmD   Please be sure to bring in all your medications bottles to every appointment.   If you have any questions or concerns before your next appointment please send [place] a message through [place] or call our office at [phone].    TO LEAVE A MESSAGE FOR THE NURSE SELECT OPTION 2, PLEASE LEAVE A MESSAGE INCLUDING: . YOUR NAME . DATE OF BIRTH . CALL BACK NUMBER . REASON FOR CALL**this is important as we prioritize the call backs  YOU WILL RECEIVE A CALL BACK THE SAME DAY AS LONG AS YOU CALL BEFORE 4:00 PM  Please see our updated No Show and Same Day  Appointment Cancellation Policy attached to your AVS.

## 2020-07-27 NOTE — Progress Notes (Signed)
Advanced Heart Failure Clinic Note   Referring Physician: PCP: [name], MD PCP-Cardiologist: Dr. Aundra Dubin  EP: Dr. Curt Bears  Reason for Visit: post hospital f/u, chronic systolic heart failure + functional MR   HPI: Cynthia Frank a 85 y.o.femalewho has a history of chronic systolic CHF/nonischemic cardiomyopathy, paroxysmal atrial fibrillation, and prior ovarian and colon cancers. She has had a cardiomyopathy known for >15 years now. Cardiac cath at diagnosis showed mild nonobstructive disease. EF has been persistently low. Cause has been thought to be Adriamycin; she received this as part of her ovarian cancer treatment.   Cardiac MRI was done in 1/17, showing EF 29% with normal RV. Septal-lateral dyssynchrony was present and there was a non-coronary pattern of LGE in the septum. LFTs were noted to be mildly elevated. Amiodarone was decreased to 100 mg daily.   She had Medtronic CRT-D placed in 3/17. Repeat echo 8/17 showed EF 35% with diffuse hypokinesis, normal RV. Echo in 4/19 showed EF 30-35%, mild AI.    In 8/20, she had an episode of lightheadedness with standing followed by syncope and fall with right hip fracture. She was admitted, had repair of right hip. She was noted to be orthostatic and Entresto was stopped. Echo in 8/20 showed EF 45-50% with normal RV systolic function.  In 1/21, patient was in atrial tachycardia and had been in it for several weeks. She was volume overloaded. Lasix was increased and she was brought into the hospital for DCCV, but she had converted to NSR on her own.   In 4/21, she had atrial fibrillation ablation. TEE done in 4/21 showed EF 30-35% with global HK. She is now off amiodarone.   Echo in 11/21 showed EF 25-30% with mild LV dilation, global hypokinesis with septal-lateral dyssynchrony, moderate MR, moderately decreased RV systolic function with normal RV size, IVC normal.  She went into atrial flutter  in 11/21 and had DCCV back to NSR in 1/22. She stayed in NSR for a few weeks but is back in atrial fibrillation/flutter again. She had COVID-19 in 2/22.   Given persistent atrial fibrillation, she was referred to EP and underwent repeat afib ablation on 07/15/20. TEE was performed and showed severe MR (worse from prior study). LVEF was 20-25%. RV was moderately reduced.   She was discharged home on 4/1 but didn't feel well post procedure, "felt off" and light headed, and subsequently came back through the ED on 4/2 where she was found to be fluid overloaded w/ low BP w/ SPBs in the 90s. BNP elevated at 241. EKG showed atrial sensed, V paced rhythm. Also w/ hyponatremia w/ Na at 123. SCr 1.46, close to baseline. She was admitted by cardiology and started on IV Lasix. AHF team consulted. She was diuresed w/ IV Lasix and given tolvaptan for hyponatremia, which improved. After diuresis, she had RHC which showed optimized (to low) volume status with preserved cardiac output. Structural heart team evaluated for MR and she was felt to be a candidate for MitraClip. On 4/7 she underwent sucessful  MitraClip by Dr. Burt Knack w/ good reduction in mitral regurgitation. She was discharged home on 4/8. Discharge wt 119 lb.   She returns back to clinic today for post hospital f/u. Here by herself. Doing well. Feels "much better". Able to walk several laps around her house w/o significant dyspnea. Was able to attend supper club last night w/ her friends. Device interrogation shows no further Afib since her recent ablation. Fluid index/ impendence improving. Impedence up  and now at reference curve. Wt stable since d/c.  BP well controlled. Unfortunately, she has not been taking digoxin since discharge, as her pharmacy did not have it in stock. She reports she is pleased that her BP has been running a bit higher as she feels much better and did not tolerate low BPs too well.   Device interrogation: No further Afib since  ablation 3/30. Impendence trending back up after recent admit + treatment w/ IV diuretics, at reference curve. Index below threshold. V-paced ~98%. No VT/VF    Review of systems complete and found to be negative unless listed in HPI.      Past Medical History:  Diagnosis Date  . AICD (automatic cardioverter/defibrillator) present   . Arthritis    "left ankle" (07/14/2015)  . Atrial fibrillation ([place])   . Cataract    bilateral -not a surgical candidate  . CHF (congestive heart failure) ([place])   . Colon cancer ([place]) 03/2006   T3, N0  . Colon polyps 12/07/2010  . Coronary artery disease    nonobstructive with 30% D1  . Diabetes mellitus without complication ([place])    on meds  . GERD (gastroesophageal reflux disease)    OTC PRN -diet controlled  . History of ovarian cancer 1985  . Hx: UTI (urinary tract infection)   . Hyperlipidemia    on meds  . Hypertension    on meds  . Internal hemorrhoid   . Nonischemic cardiomyopathy ([place])    last EF assessment 40% by MUGA  . Osteoporosis    Prolia once per year  . Partial bowel obstruction ([place])   . PVC (premature ventricular contraction)   . 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 Burt Knack  . Thyroid disease    on meds    Current Outpatient Medications  Medication Sig Dispense Refill  . acetaminophen (TYLENOL) 500 MG tablet Take 500 mg by mouth every 6 (six) hours as needed for moderate pain or headache.    Marland Kitchen atorvastatin (LIPITOR) 40 MG tablet TAKE 1 TABLET(40 MG) BY MOUTH DAILY AT 6 PM (Patient taking differently: Take 40 mg by mouth at bedtime.) 90 tablet 3  . clobetasol (TEMOVATE) 0.05 % external solution Apply 1 application topically 2 (two) times daily.    . Coenzyme Q10 (COQ10) 100 MG CAPS Take 100 mg by mouth daily.    Marland Kitchen conjugated estrogens (PREMARIN) vaginal cream Place 1 Applicatorful vaginally daily as needed (dyspareunia (Use as directed)).     .  dapagliflozin propanediol (FARXIGA) 10 MG TABS tablet Take 1 tablet (10 mg total) by mouth daily before breakfast. 30 tablet 6  . diclofenac Sodium (VOLTAREN) 1 % GEL Apply 2 g topically daily.    . digoxin 62.5 MCG TABS Take 0.0625 mg by mouth every other day. 15 tablet 6  . ELIQUIS 2.5 MG TABS tablet TAKE 1 TABLET(2.5 MG) BY MOUTH TWICE DAILY (Patient taking differently: Take 2.5 mg by mouth 2 (two) times daily.) 180 tablet 2  . furosemide (LASIX) 40 MG tablet Take 1 tablet (40 mg total) by mouth daily. 30 tablet 5  . ipratropium (ATROVENT) 0.03 % nasal spray Place 1 spray into both nostrils 2 (two) times daily.    Marland Kitchen ketoconazole (NIZORAL) 2 % shampoo Apply 1 application topically 3 (three) times a week.    . levothyroxine (SYNTHROID) 25 MCG tablet TAKE 1 TABLET(25 MCG) BY MOUTH DAILY BEFORE BREAKFAST (Patient taking  differently: Take 25 mcg by mouth daily before breakfast.) 30 tablet 6  . Magnesium Oxide 500 MG CAPS Take 1,000 mg by mouth 2 (two) times daily.     . mupirocin ointment (BACTROBAN) 2 % Place 1 application into the nose 2 (two) times daily. 1 g 0  . potassium chloride SA (KLOR-CON) 20 MEQ tablet Take 1 tablet (20 mEq total) by mouth daily. 90 tablet 3  . sertraline (ZOLOFT) 25 MG tablet TAKE 1 TABLET(25 MG) BY MOUTH DAILY (Patient taking differently: Take 25 mg by mouth daily.) 30 tablet 0  . spironolactone (ALDACTONE) 25 MG tablet TAKE 1 TABLET(25 MG) BY MOUTH AT BEDTIME (Patient taking differently: Take 25 mg by mouth at bedtime.) 90 tablet 3  . zolpidem (AMBIEN) 5 MG tablet Take 5 mg by mouth at bedtime as needed for sleep.     No current facility-administered medications for this encounter.    Allergies  Allergen Reactions  . Clindamycin/Lincomycin Itching  . Dramamine Ii [Meclizine Hcl] Other (See Comments)    hyper  . Pneumococcal Vaccines Itching and Swelling    Redness   . Sulfonamide Derivatives Itching and Swelling      Social History   Socioeconomic History   . Marital status: Married    Spouse name: Not on file  . Number of children: 2  . Years of education: Not on file  . Highest education level: Not on file  Occupational History  . Occupation: retired  Tobacco Use  . Smoking status: Former Smoker    Packs/day: 0.50    Years: 20.00    Pack years: 10.00    Types: Cigarettes    Quit date: 04/18/1978    Years since quitting: 42.3  . Smokeless tobacco: Never Used  Vaping Use  . Vaping Use: Never used  Substance and Sexual Activity  . Alcohol use: Yes    Alcohol/week: 14.0 standard drinks    Types: 14 Standard drinks or equivalent per week    Comment: has wine before dinner   . Drug use: No  . Sexual activity: Not Currently  Other Topics Concern  . Not on file  Social History Narrative   Patient had never smoked.   Alcohol use- yes   Daily caffeine use- coffee   Illicit drug use- no   Occupation:retired    Social Determinants of Health   Financial Resource Strain: Low Risk   . Difficulty of Paying Living Expenses: Not hard at all  Food Insecurity: No Food Insecurity  . Worried About Charity fundraiser in the Last Year: Never true  . Ran Out of Food in the Last Year: Never true  Transportation Needs: No Transportation Needs  . Lack of Transportation (Medical): No  . Lack of Transportation (Non-Medical): No  Physical Activity: Not on file  Stress: Not on file  Social Connections: Not on file  Intimate Partner Violence: Not on file      Family History  Problem Relation Age of Onset  . Uterine cancer Mother   . Prostate cancer Father   . Heart disease Father   . Heart attack Father   . Colon cancer Neg Hx   . Colon polyps Neg Hx   . Esophageal cancer Neg Hx   . Rectal cancer Neg Hx   . Stomach cancer Neg Hx     Vitals:   07/27/20 1004  BP: 112/78  Pulse: 81  SpO2: 97%  Weight: 54.4 kg (120 lb)     PHYSICAL EXAM:  General:  Well appearing elderly WF. No respiratory difficulty HEENT: normal Neck: supple.  no JVD. Carotids 2+ bilat; no bruits. No lymphadenopathy or thyromegaly appreciated. Cor: PMI nondisplaced. Regular rate & rhythm. No rubs, gallops or murmurs. Lungs: clear Abdomen: soft, nontender, nondistended. No hepatosplenomegaly. No bruits or masses. Good bowel sounds. Extremities: no cyanosis, clubbing, rash, edema Neuro: alert & oriented x 3, cranial nerves grossly intact. moves all 4 extremities w/o difficulty. Affect pleasant.  ECG: atrial sensed v-paced 69 bpm    ASSESSMENT & PLAN:  1.Chronic systolic CHF: EF 06-26% with diffuse hypokinesis on 12/16 echo. Nonischemic cardiomyopathy, probably related to Adriamycin with ovarian cancer treatment. Cardiac MRI in 1/17 showed EF 29% with normal RV and septal-lateral dyssynchrony. There was a non-cardiac pattern of LGE in the septum, cannot rule out prior myocarditis based on this pattern. She has a Medtronic CRT-D device. Echo 4/19 with EF 30-35%, then echo in 8/20 showed EF up to 45-50%. TEE in 3/21 showed EF 30-35%. Echo in 11/21 showed EF 25-30% with mild LV dilation, global hypokinesis with septal-lateral dyssynchrony, moderate MR, moderately decreased RV systolic function with normal RV size, IVC normal.TEE in 3/22 showed EF 20-25%, moderate LV dilation, moderately decreased RV systolic function, moderate TR, and severe MR with malcoaptation of P2/P3 scallops. She was recently admitted 4/2-4/8 with worsening dyspnea/volume overload>>diuresed w/ IV Lasix. RHC after diuresis showed optimized (to low) volume status with preserved cardiac output. Underwent MitraClip on 4/7 w/ good result. Now w/ improved functional class, NYHA II-III.  Volume status stable on exam and by Optivol. No AFib, VT/VF on device interrogation. BiV paced ~98%.  - Continue lasix 40 mg daily.   - Continue spironolactone 25 mg daily. - She will stay off Entresto given syncope and lightheadedness while taking it.Will not start losartan with soft BP.  -Continue  Farxiga 10 mg daily  - Plan to restart digoxin, low dose 0.0625 qod. Watch closely with CKD 3 and small size.Will send Rx to different pharmacy. Check Dig level in 7 days  - Not candidate for advanced therapies with age.  2. Atrial arrhythmias: She needs to maintain NSR as she loses BiV pacing when she goes into atrial arrhythmias, triggering CHF exacerbation. She had atrial fibrillation ablation in 4/21. She went into atrial flutter in 11/21, she had DCCV in 12/21,wentback in atrial flutter on amiodarone.I think that her QT interval has been too long for Tikosyn.She had redo AF ablation in 3/22. She is in NSR today. No recurrence detected on device interrogation since ablation.  -Amiodarone was stopped again with rise in LFTs. Follow for now, ?due to HF or due to amiodarone, may rechallenge in future.  - Continue apixaban. Denies abnormal bleeding. Check CBC 3. Hypothyroidism: Suspect related to prior amiodarone use.  - Continue Levoxyl.  4. Elevated LFTs: Mild elevation in past, resolved in 11/21. Abdominal US showed unremarkable liver. ?Related to amiodarone. Amio recently stopped. Check CMP today  5. CAD: Calcification noted in coronaries with pulmonary vein CT done prior to afib ablation.  - Continue atorvastatin 40 daily, good lipids in 8/21.  - No ASA given apixaban use. 6. Mitral regurgitation: Severe MR on TEE in 3/22. Suspect atrial enlargement stretched MV annulus, ha P2-P3 malcoaptation. Suspect primarily functional. S/P MitraClip 07/23/20 w/ good result. Plan repeat echo and f/u w/ structural heart team on 5/12 7. Hyponatremia: required tolvaptan recent admit. Check CMP today  8. Fe deficiency: Low transferrin saturation - received feraheme 4/4 - check CBC today  She has close f/u in 2 weeks in the AF clinic and w/ the structural heart team in 4 weeks. Will arrange f/u w/ Dr. Aundra Dubin in 6 weeks.      Lyda Jester, PA-C 07/27/20

## 2020-07-28 ENCOUNTER — Ambulatory Visit (INDEPENDENT_AMBULATORY_CARE_PROVIDER_SITE_OTHER): Payer: Medicare Other

## 2020-07-28 DIAGNOSIS — I428 Other cardiomyopathies: Secondary | ICD-10-CM

## 2020-07-28 LAB — CUP PACEART REMOTE DEVICE CHECK
Battery Remaining Longevity: 7 mo
Battery Voltage: 2.87 V
Brady Statistic AP VP Percent: 11.13 %
Brady Statistic AP VS Percent: 0.75 %
Brady Statistic AS VP Percent: 87.73 %
Brady Statistic AS VS Percent: 0.39 %
Brady Statistic RA Percent Paced: 11.81 %
Brady Statistic RV Percent Paced: 93.07 %
Date Time Interrogation Session: 20220412001803
HighPow Impedance: 73 Ohm
Implantable Lead Implant Date: 20170328
Implantable Lead Implant Date: 20170328
Implantable Lead Implant Date: 20170328
Implantable Lead Location: 753858
Implantable Lead Location: 753859
Implantable Lead Location: 753860
Implantable Lead Model: 4598
Implantable Lead Model: 5076
Implantable Lead Model: 6935
Implantable Pulse Generator Implant Date: 20170328
Lead Channel Impedance Value: 304 Ohm
Lead Channel Impedance Value: 304 Ohm
Lead Channel Impedance Value: 342 Ohm
Lead Channel Impedance Value: 399 Ohm
Lead Channel Impedance Value: 399 Ohm
Lead Channel Impedance Value: 399 Ohm
Lead Channel Impedance Value: 418 Ohm
Lead Channel Impedance Value: 456 Ohm
Lead Channel Impedance Value: 551 Ohm
Lead Channel Impedance Value: 589 Ohm
Lead Channel Impedance Value: 608 Ohm
Lead Channel Impedance Value: 646 Ohm
Lead Channel Impedance Value: 646 Ohm
Lead Channel Pacing Threshold Amplitude: 0.875 V
Lead Channel Pacing Threshold Amplitude: 1.125 V
Lead Channel Pacing Threshold Amplitude: 1.25 V
Lead Channel Pacing Threshold Pulse Width: 0.4 ms
Lead Channel Pacing Threshold Pulse Width: 0.4 ms
Lead Channel Pacing Threshold Pulse Width: 0.4 ms
Lead Channel Sensing Intrinsic Amplitude: 0.25 mV
Lead Channel Sensing Intrinsic Amplitude: 0.25 mV
Lead Channel Sensing Intrinsic Amplitude: 9 mV
Lead Channel Sensing Intrinsic Amplitude: 9 mV
Lead Channel Setting Pacing Amplitude: 2.25 V
Lead Channel Setting Pacing Amplitude: 2.5 V
Lead Channel Setting Pacing Amplitude: 3 V
Lead Channel Setting Pacing Pulse Width: 0.4 ms
Lead Channel Setting Pacing Pulse Width: 0.4 ms
Lead Channel Setting Sensing Sensitivity: 0.3 mV

## 2020-07-31 ENCOUNTER — Encounter (HOSPITAL_COMMUNITY): Payer: Medicare Other

## 2020-08-03 ENCOUNTER — Ambulatory Visit (INDEPENDENT_AMBULATORY_CARE_PROVIDER_SITE_OTHER): Payer: Medicare Other

## 2020-08-03 ENCOUNTER — Telehealth (HOSPITAL_COMMUNITY): Payer: Self-pay | Admitting: Cardiology

## 2020-08-03 DIAGNOSIS — Z95810 Presence of automatic (implantable) cardiac defibrillator: Secondary | ICD-10-CM

## 2020-08-03 DIAGNOSIS — I5022 Chronic systolic (congestive) heart failure: Secondary | ICD-10-CM

## 2020-08-03 NOTE — Telephone Encounter (Signed)
Pt request a script for a remote lift chair, the kind that go up the stairs, please advise, pt can be  reached [phone]. Thanks

## 2020-08-03 NOTE — Telephone Encounter (Signed)
We do not write scripts for chair lifts per Adline Potter. Pt is aware she will need to contact her pcp for script.

## 2020-08-05 ENCOUNTER — Ambulatory Visit (INDEPENDENT_AMBULATORY_CARE_PROVIDER_SITE_OTHER): Payer: Medicare Other | Admitting: Internal Medicine

## 2020-08-05 ENCOUNTER — Encounter: Payer: Self-pay | Admitting: Internal Medicine

## 2020-08-05 ENCOUNTER — Other Ambulatory Visit: Payer: Self-pay

## 2020-08-05 DIAGNOSIS — I5022 Chronic systolic (congestive) heart failure: Secondary | ICD-10-CM | POA: Diagnosis not present

## 2020-08-05 DIAGNOSIS — G4700 Insomnia, unspecified: Secondary | ICD-10-CM

## 2020-08-05 MED ORDER — ZOLPIDEM TARTRATE 5 MG PO TABS: 5.0000 mg | ORAL_TABLET | Freq: Every evening | ORAL | 3 refills | Status: AC | PRN

## 2020-08-05 NOTE — Progress Notes (Signed)
   Subjective:   Patient ID: Cynthia Frank, female    DOB: 09-15-1935, 85 y.o.   MRN: [id number]  HPI The patient is an 85 YO female coming in for need of chair lift. She has chronic systolic heart failure and is unable to climb a flight of stairs. Has two sets at home. One is full length and other 3 steps. She is not able to negotiate either. She did call her cardiologist to see if they could give her rx and they were not willing or able to do this.   Review of Systems  Constitutional: Negative.   HENT: Negative.   Eyes: Negative.   Respiratory: Positive for shortness of breath. Negative for cough and chest tightness.   Cardiovascular: Negative for chest pain, palpitations and leg swelling.  Gastrointestinal: Negative for abdominal distention, abdominal pain, constipation, diarrhea, nausea and vomiting.  Musculoskeletal: Negative.   Skin: Negative.   Neurological: Negative.   Psychiatric/Behavioral: Negative.     Objective:  Physical Exam Constitutional:      Appearance: She is well-developed.  HENT:     Head: Normocephalic and atraumatic.  Cardiovascular:     Rate and Rhythm: Normal rate and regular rhythm.  Pulmonary:     Effort: Pulmonary effort is normal. No respiratory distress.     Breath sounds: Normal breath sounds. No wheezing or rales.  Abdominal:     General: Bowel sounds are normal. There is no distension.     Palpations: Abdomen is soft.     Tenderness: There is no abdominal tenderness. There is no rebound.  Musculoskeletal:     Cervical back: Normal range of motion.  Skin:    General: Skin is warm and dry.  Neurological:     Mental Status: She is alert and oriented to person, place, and time.     Coordination: Coordination normal.     Vitals:   08/05/20 1001  BP: 118/78  Pulse: 80  Resp: 18  Temp: 97.6 F (36.4 C)  TempSrc: Oral  SpO2: 94%  Weight: 117 lb (53.1 kg)  Height: 5\' 4"  (1.626 m)    This visit occurred during the SARS-CoV-2 public  health emergency.  Safety protocols were in place, including screening questions prior to the visit, additional usage of staff PPE, and extensive cleaning of exam room while observing appropriate contact time as indicated for disinfecting solutions.   Assessment & Plan:

## 2020-08-05 NOTE — Patient Instructions (Signed)
We can do the prescription for the stair lift.

## 2020-08-07 ENCOUNTER — Telehealth: Payer: Self-pay

## 2020-08-07 NOTE — Assessment & Plan Note (Signed)
Given rx for stair lift as she is unable to climb steps due to her systolic heart failure.

## 2020-08-07 NOTE — Telephone Encounter (Signed)
Remote ICM transmission received.  Attempted call to patient regarding ICM remote transmission and no answer or voice mail option.  

## 2020-08-07 NOTE — Assessment & Plan Note (Signed)
Refill ambien which she uses rarely and intermittently for sleep. Denies side effects and reminded about risk of falls and memory changes.

## 2020-08-07 NOTE — Progress Notes (Signed)
EPIC Encounter for ICM Monitoring  Patient Name: Cynthia Frank is a 85 y.o. female Date: 08/07/2020 Primary Care Physican: [name], MD Primary Cardiologist:[place] Electrophysiologist:Taylor Bi-V Pacing: 94.3% 4/11/2022Office Weight: 120lbs  Time in AT/AF     <0.1 hr/day (<0.1%)  Battery ERI 7 months   Attempted call to patient and unable to reach.  Transmission reviewed.   Opitvol thoracic impedancesuggestingfluid levels normal.Pt was diuresed during hospitalization 4/2-4/8.   She had afib ablation 07/15/2020 and mitral clip 4/7.  Prescribed:   Furosemide40 mgTake 1 tablet (40 mg total) by mouth daily  Potassium 20 mEq take 1 tablet daily  Spironolactone 25 mg take 1 tablet daily.  Labs: 07/27/2020 Creatinine 1.19, BUN 18, Potassium 5.1, Sodium 127, GFR 46 07/24/2020 Creatinine 0.93, BUN 16, Potassium 3.6, Sodium 129, GFR >60  07/22/2020 Creatinine 1.30, BUN 18, Potassium 3.4, Sodium 131, GFR 41  07/21/2020 Creatinine 1.24, BUN 20, Potassium 3.8, Sodium 127, GFR 43  A complete set of results can be found in Results Review.  Recommendations: Unable to reach.    Follow-up plan: ICM clinic phone appointment on4/31/2022. 91 day device clinic remote transmission7/03/2021.   EP/Cardiology Office Visits:  09/08/2020 with Dr Aundra Dubin. 11/03/2020 with Dr Curt Bears  Copy of ICM check sent to Dr.Taylor.  3 month ICM trend: 08/03/2020.    1 Year ICM trend:       Rosalene Billings, RN 08/07/2020 4:01 PM

## 2020-08-11 ENCOUNTER — Other Ambulatory Visit (HOSPITAL_COMMUNITY): Payer: Self-pay | Admitting: Cardiology

## 2020-08-12 ENCOUNTER — Ambulatory Visit (HOSPITAL_COMMUNITY): Payer: Medicare Other | Admitting: Nurse Practitioner

## 2020-08-12 NOTE — Progress Notes (Signed)
Remote ICD transmission.   

## 2020-08-18 ENCOUNTER — Inpatient Hospital Stay (HOSPITAL_COMMUNITY): Admission: RE | Admit: 2020-08-18 | Payer: Medicare Other | Source: Ambulatory Visit | Admitting: Nurse Practitioner

## 2020-08-18 ENCOUNTER — Encounter (HOSPITAL_COMMUNITY): Payer: Self-pay

## 2020-08-21 ENCOUNTER — Telehealth (HOSPITAL_COMMUNITY): Payer: Self-pay | Admitting: Cardiology

## 2020-08-21 DIAGNOSIS — I5022 Chronic systolic (congestive) heart failure: Secondary | ICD-10-CM

## 2020-08-21 DIAGNOSIS — J385 Laryngeal spasm: Secondary | ICD-10-CM | POA: Diagnosis not present

## 2020-08-21 MED ORDER — POTASSIUM CHLORIDE CRYS ER 20 MEQ PO TBCR: 10.0000 meq | EXTENDED_RELEASE_TABLET | Freq: Every day | ORAL | 3 refills | Status: DC

## 2020-08-21 NOTE — Telephone Encounter (Signed)
Pt aware and voiced understadning Repeat labs 5/12

## 2020-08-21 NOTE — Telephone Encounter (Signed)
-----   Message from Consuelo Pandy, [place] sent at 07/27/2020  4:23 PM EDT ----- Labs stable but K upper limits of normal, reduce KCl to 10 meQ daily. Repeat BMP when she returns for f/u labs in 2 weeks (checking dig level at time of Afib clinic f/u).

## 2020-08-26 ENCOUNTER — Encounter (HOSPITAL_COMMUNITY): Payer: Self-pay

## 2020-08-26 ENCOUNTER — Telehealth: Payer: Self-pay

## 2020-08-26 ENCOUNTER — Ambulatory Visit (INDEPENDENT_AMBULATORY_CARE_PROVIDER_SITE_OTHER): Payer: Medicare Other

## 2020-08-26 ENCOUNTER — Other Ambulatory Visit: Payer: Self-pay

## 2020-08-26 DIAGNOSIS — M810 Age-related osteoporosis without current pathological fracture: Secondary | ICD-10-CM

## 2020-08-26 MED ORDER — DENOSUMAB 60 MG/ML ~~LOC~~ SOSY
60.0000 mg | PREFILLED_SYRINGE | Freq: Once | SUBCUTANEOUS | Status: AC
  Administered 2020-08-26: 60 mg via SUBCUTANEOUS

## 2020-08-26 NOTE — Progress Notes (Signed)
Patient came into the office today for there Prolia injection. She tolerated injection well. No questions or concerns.

## 2020-08-26 NOTE — Telephone Encounter (Signed)
Patient came into the office to receive her prolia injection. She mentioned that she needed a refill on her Zoloft but she stated that she would like to go back on Celexa. Patient stated that she currently has 2 pills left of the Zoloft. Does she need a visit to discuss? Please advise

## 2020-08-27 ENCOUNTER — Encounter: Payer: Self-pay | Admitting: Physician Assistant

## 2020-08-27 ENCOUNTER — Ambulatory Visit (INDEPENDENT_AMBULATORY_CARE_PROVIDER_SITE_OTHER): Payer: Medicare Other | Admitting: Physician Assistant

## 2020-08-27 ENCOUNTER — Ambulatory Visit (HOSPITAL_COMMUNITY): Payer: Medicare Other | Attending: Cardiology

## 2020-08-27 VITALS — BP 92/60 | HR 76 | Ht 64.5 in | Wt 117.0 lb

## 2020-08-27 DIAGNOSIS — I340 Nonrheumatic mitral (valve) insufficiency: Secondary | ICD-10-CM | POA: Insufficient documentation

## 2020-08-27 DIAGNOSIS — I5042 Chronic combined systolic (congestive) and diastolic (congestive) heart failure: Secondary | ICD-10-CM | POA: Diagnosis not present

## 2020-08-27 DIAGNOSIS — Z95818 Presence of other cardiac implants and grafts: Secondary | ICD-10-CM | POA: Diagnosis not present

## 2020-08-27 DIAGNOSIS — Z9889 Other specified postprocedural states: Secondary | ICD-10-CM

## 2020-08-27 DIAGNOSIS — I4819 Other persistent atrial fibrillation: Secondary | ICD-10-CM | POA: Diagnosis not present

## 2020-08-27 DIAGNOSIS — F32A Depression, unspecified: Secondary | ICD-10-CM | POA: Diagnosis not present

## 2020-08-27 LAB — ECHOCARDIOGRAM COMPLETE
Area-P 1/2: 3.44 cm2
MV VTI: 0.83 cm2
P 1/2 time: 396 msec
S' Lateral: 4.8 cm

## 2020-08-27 NOTE — Progress Notes (Signed)
Medical treatment/procedure(s) were performed by non-physician practitioner and as supervising physician I was immediately available for consultation/collaboration. I agree with above. Magdaline Zollars A Kindle Strohmeier, MD  

## 2020-08-27 NOTE — Telephone Encounter (Signed)
She would need visit as her cardiologist changed this for her we would want to talk about how she is doing and what best options are for change.

## 2020-08-27 NOTE — Telephone Encounter (Signed)
See below

## 2020-08-27 NOTE — Patient Instructions (Signed)
Medication Instructions:  1) STOP ZOLOFT 2) RESTART CITALOPRAM you have at home. Call us to confirm your dosage to add to your record. *If you need a refill on your cardiac medications before your next appointment, please call your pharmacy*   Follow-Up: Please keep your follow-up appointment with Dr. Aundra Dubin.  You will be called to arrange your 1 year visits!

## 2020-08-27 NOTE — Progress Notes (Signed)
[place]                                     Cardiology Office Note:    Date:  08/28/2020   ID:  Cynthia Frank, DOB 10-18-1935, MRN [id number]  PCP:  [name], MD  [facility] Cardiologist:  [name], MD  [place] Electrophysiologist:  Will Meredith Leeds, MD   Referring MD: [name], *   1 month s/p TEER   History of Present Illness:    Cynthia Frank is a 85 y.o. female with a hx of chronic systolic CHF/NICM felt to be due to Adriamycin use for treatment of ovarian cancer, s/p Medtronic CRT-D, mitral valve regurgitation, atrial tachycardia, afib/flutter on Eliquis with recent repeat afib ablation (07/15/20) and severe MR s/p TEER (07/24/20) who presents to clinic for follow up.   Pre ablation TEE showed EF 20-25%, moderate LV dilation, moderately decreased RV systolic function, moderate TR, and severe MR with malcoaptation of P2/P3 scallops. She was discharged home but then re-admitted on 07/18/20 with worsening dyspnea/volume overload. Structural heart team asked to see while admitted and she underwent successful transcatheter edge to edge mitral valve repair with a MitraClip G4 NTW device positioned A2/P2, reducing baseline mitral regurgitation from 4+ to 2+ post-procedure. Post op echo showed EF 20-25%, mild RV dysfunction with stable MitraClip in A2-P2 position with trivial residual MR with a mean mitral gradient of 1.6 mm hg. She was discharged on home Eliquis 2.5mg  BID without concurrent antiplatelet therapy. She was discharged home with close follow up in the advanced CHF clinic and reported improvement in her symptoms with an ability to walk further and attend a supper club with friends.  Today she presents to clinic for follow up. Feeling a lot better since clip. She has had some intermittent palpitations. She was feeling wonderful but then the past couple days she has had a little  more dyspnea and fatigue. She was not able to find digoxin at the pharmacy. She also read about it and was worried about taking this medication. No CP. No LE edema, orthopnea or PND. No dizziness or syncope. No blood in stool or urine.   Past Medical History:  Diagnosis Date  . AICD (automatic cardioverter/defibrillator) present   . Arthritis    "left ankle" (07/14/2015)  . Atrial fibrillation ([place])   . Cataract    bilateral -not a surgical candidate  . CHF (congestive heart failure) ([place])   . Colon cancer () 03/2006   T3, N0  . Colon polyps 12/07/2010  . Coronary artery disease    nonobstructive with 30% D1  . Diabetes mellitus without complication ([place])    on meds  . GERD (gastroesophageal reflux disease)    OTC PRN -diet controlled  . History of ovarian cancer 1985  . Hx: UTI (urinary tract infection)   . Hyperlipidemia    on meds  . Hypertension    on meds  . Internal hemorrhoid   . Nonischemic cardiomyopathy ([place])    last EF assessment 40% by MUGA  . Osteoporosis    Prolia once per year  . Partial bowel obstruction ([place])   . PVC (premature ventricular contraction)   . 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 Burt Knack  . Thyroid  disease    on meds    Past Surgical History:  Procedure Laterality Date  . ABDOMINAL HYSTERECTOMY  1979  . ANKLE CLOSED REDUCTION  10/16/2011   Procedure: CLOSED REDUCTION ANKLE;  Surgeon: Tobi Bastos, MD;  Location: [facility];  Service: Orthopedics;  Laterality: Left;  . ATRIAL FIBRILLATION ABLATION N/A 08/14/2019   Procedure: ATRIAL FIBRILLATION ABLATION;  Surgeon: Constance Haw, MD;  Location: [place] CV LAB;  Service: Cardiovascular;  Laterality: N/A;  . ATRIAL FIBRILLATION ABLATION N/A 07/15/2020   Procedure: ATRIAL FIBRILLATION ABLATION;  Surgeon: Constance Haw, MD;  Location: [place] [facility];  Service: Cardiovascular;  Laterality: N/A;  .  CARDIOVERSION N/A 04/20/2020   Procedure: CARDIOVERSION;  Surgeon: Larey Dresser, MD;  Location: [facility];  Service: Cardiovascular;  Laterality: N/A;  . COLECTOMY  2007   for colon cancer  . COLONOSCOPY N/A 03/11/2013   Procedure: COLONOSCOPY;  Surgeon: Ladene Artist, MD;  Location: [facility];  Service: Endoscopy;  Laterality: N/A;  . COLONOSCOPY  2019   MS-plenvu(good)-int hems-SSA x 2  . COLONOSCOPY WITH PROPOFOL N/A 04/21/2015   Procedure: COLONOSCOPY WITH PROPOFOL;  Surgeon: Ladene Artist, MD;  Location: [facility];  Service: Endoscopy;  Laterality: N/A;  . COLONOSCOPY WITH PROPOFOL N/A 02/05/2018   Procedure: COLONOSCOPY WITH PROPOFOL Hemostasis Clip;  Surgeon: Ladene Artist, MD;  Location: [facility];  Service: Endoscopy;  Laterality: N/A;  . EP IMPLANTABLE DEVICE N/A 07/14/2015   Procedure: BiV ICD Insertion CRT-D;  Surgeon: Evans Lance, MD;  Location: [place] CV LAB;  Service: Cardiovascular;  Laterality: N/A;  . ESOPHAGOGASTRODUODENOSCOPY (EGD) WITH PROPOFOL N/A 04/21/2015   Procedure: ESOPHAGOGASTRODUODENOSCOPY (EGD) WITH PROPOFOL;  Surgeon: Ladene Artist, MD;  [facility];  Service: Endoscopy;  Laterality: N/A;  . FRACTURE SURGERY    . INSERTION OF ICD Left 07/14/2015  . LAPAROTOMY  1980   "adhesions"  . LAPAROTOMY  1989   laparotomy for takedown of intestinal obstruction secondary to adhesions   . MITRAL VALVE REPAIR N/A 07/23/2020   Procedure: MITRAL VALVE REPAIR;  Surgeon: Sherren Mocha, MD;  Location: [place] CV LAB;  Service: Cardiovascular;  Laterality: N/A;  . OOPHORECTOMY  1961  . OVARY SURGERY Right 1985   resection of right ovarian cancer  . POLYPECTOMY  02/05/2018   Procedure: POLYPECTOMY;  Surgeon: Ladene Artist, MD;  [facility]: [facility];  Service: Endoscopy;;  . POLYPECTOMY  2018   SSA x 2  . RIGHT HEART CATH N/A 07/22/2020   Procedure: RIGHT HEART CATH;  Surgeon: Larey Dresser, MD;  Location: [place] CV LAB;   Service: Cardiovascular;  Laterality: N/A;  . SMALL INTESTINE SURGERY  1985  . TEE WITHOUT CARDIOVERSION N/A 08/14/2019   Procedure: TRANSESOPHAGEAL ECHOCARDIOGRAM (TEE);  Surgeon: Constance Haw, MD;  Location: [place] CV LAB;  Service: Cardiovascular;  Laterality: N/A;  . TEE WITHOUT CARDIOVERSION N/A 07/15/2020   Procedure: TRANSESOPHAGEAL ECHOCARDIOGRAM (TEE);  Surgeon: Constance Haw, MD;  Location: [place] CV LAB;  Service: Cardiovascular;  Laterality: N/A;  . TEE WITHOUT CARDIOVERSION N/A 07/23/2020   Procedure: TRANSESOPHAGEAL ECHOCARDIOGRAM (TEE);  Surgeon: Sherren Mocha, MD;  Location: [place] CV LAB;  Service: Cardiovascular;  Laterality: N/A;  . TONSILLECTOMY  1944  . TOTAL HIP ARTHROPLASTY Right 12/18/2018   Procedure: TOTAL HIP ARTHROPLASTY ANTERIOR APPROACH;  Surgeon: Rod Can, MD;  Location: [facility];  Service: Orthopedics;  Laterality: Right;    Current Medications: Current Meds  Medication Sig  . acetaminophen (TYLENOL) 500 MG tablet Take 500 mg by mouth every 6 (six) hours as needed for moderate pain or headache.  Marland Kitchen atorvastatin (LIPITOR) 40 MG tablet TAKE 1 TABLET(40 MG) BY MOUTH DAILY AT 6 PM  . citalopram (CELEXA) 20 MG tablet Take 20 mg by mouth daily.  . clobetasol (TEMOVATE) 0.05 % external solution Apply 1 application topically 2 (two) times daily.  . Coenzyme Q10 (COQ10) 100 MG CAPS Take 100 mg by mouth daily.  Marland Kitchen conjugated estrogens (PREMARIN) vaginal cream Place 1 Applicatorful vaginally daily as needed (dyspareunia (Use as directed)).   . dapagliflozin propanediol (FARXIGA) 10 MG TABS tablet Take 1 tablet (10 mg total) by mouth daily before breakfast.  . diclofenac Sodium (VOLTAREN) 1 % GEL Apply 2 g topically daily.  Marland Kitchen ELIQUIS 2.5 MG TABS tablet TAKE 1 TABLET(2.5 MG) BY MOUTH TWICE DAILY  . furosemide (LASIX) 40 MG tablet Take 1 tablet (40 mg total) by mouth daily.  Marland Kitchen ipratropium (ATROVENT) 0.03 % nasal spray Place 1 spray into both  nostrils 2 (two) times daily.  Marland Kitchen ketoconazole (NIZORAL) 2 % shampoo Apply 1 application topically 3 (three) times a week.  . levothyroxine (SYNTHROID) 25 MCG tablet TAKE 1 TABLET(25 MCG) BY MOUTH DAILY BEFORE BREAKFAST  . Magnesium Oxide 500 MG CAPS Take 1,000 mg by mouth 2 (two) times daily.   . mupirocin ointment (BACTROBAN) 2 % Place 1 application into the nose 2 (two) times daily.  . potassium chloride SA (KLOR-CON) 20 MEQ tablet Take 0.5 tablets (10 mEq total) by mouth daily.  Marland Kitchen spironolactone (ALDACTONE) 25 MG tablet TAKE 1 TABLET(25 MG) BY MOUTH AT BEDTIME  . zolpidem (AMBIEN) 5 MG tablet Take 1 tablet (5 mg total) by mouth at bedtime as needed for sleep.  . [DISCONTINUED] sertraline (ZOLOFT) 25 MG tablet TAKE 1 TABLET(25 MG) BY MOUTH DAILY     Allergies:   Clindamycin/lincomycin, Dramamine ii [meclizine hcl], Pneumococcal vaccines, and Sulfonamide derivatives   Social History   Socioeconomic History  . Marital status: Married    Spouse name: Not on file  . Number of children: 2  . Years of education: Not on file  . Highest education level: Not on file  Occupational History  . Occupation: retired  Tobacco Use  . Smoking status: Former Smoker    Packs/day: 0.50    Years: 20.00    Pack years: 10.00    Types: Cigarettes    Quit date: 04/18/1978    Years since quitting: 42.3  . Smokeless tobacco: Never Used  Vaping Use  . Vaping Use: Never used  Substance and Sexual Activity  . Alcohol use: Yes    Alcohol/week: 14.0 standard drinks    Types: 14 Standard drinks or equivalent per week    Comment: has wine before dinner   . Drug use: No  . Sexual activity: Not Currently  Other Topics Concern  . Not on file  Social History Narrative   Patient had never smoked.   Alcohol use- yes   Daily caffeine use- coffee   Illicit drug use- no   Occupation:retired    Social Determinants of Health   Financial Resource Strain: Low Risk   . Difficulty of Paying Living Expenses: Not  hard at all  Food Insecurity: No Food Insecurity  . Worried About Charity fundraiser in the Last Year: Never true  . Ran Out of Food in the Last Year: Never true  Transportation Needs: No Transportation Needs  .  Lack of Transportation (Medical): No  . Lack of Transportation (Non-Medical): No  Physical Activity: Not on file  Stress: Not on file  Social Connections: Not on file     Family History: The patient's family history includes Heart attack in her father; Heart disease in her father; Prostate cancer in her father; Uterine cancer in her mother. There is no history of Colon cancer, Colon polyps, Esophageal cancer, Rectal cancer, or Stomach cancer.  ROS:   Please see the history of present illness.    All other systems reviewed and are negative.  EKGs/Labs/Other Studies Reviewed:    The following studies were reviewed today:  07/24/20 MITRAL VALVE REPAIR  Conclusion Successful transcatheter edge to edge mitral valve repair with a MitraClip G4 NTW device positioned A2/P2, reducing baseline mitral regurgitation from 4+ to 2+ post-procedure  Plan:   Resume apixaban 2.5 mg BID tonight  POD #1 echo tomorrow  CHF management per [facility] team   ____________________  Echo 07/24/20 IMPRESSIONS  1. Left ventricular ejection fraction, by estimation, is 20 to 25%. The  left ventricle has severely decreased function. The left ventricle  demonstrates global hypokinesis. The left ventricular internal cavity size  was mildly to moderately dilated. Left  ventricular diastolic function could not be evaluated.  2. Right ventricular systolic function is mildly reduced. The right  ventricular size is mildly enlarged. There is mildly elevated pulmonary  artery systolic pressure.  3. Left atrial size was severely dilated.  4. Right atrial size was mild to moderately dilated.  5. MitraClip in A2-P2 distribution appears stable. There is trivial  residual regurgitation. Pressure half  time 71 ms. No increase in diastolic  gradients. The mitral valve has been repaired/replaced. Trivial mitral  valve regurgitation. No evidence of  mitral stenosis. The mean mitral valve gradient is 1.6 mmHg. There is a  present in the mitral position. Procedure Date: 07/23/2020.  6. Tricuspid valve regurgitation is mild to moderate.  7. The aortic valve is tricuspid. Aortic valve regurgitation is mild. No  aortic stenosis is present.  8. The inferior vena cava is dilated in size with <50% respiratory  variability, suggesting right atrial pressure of 15 mmHg.  9. Evidence of atrial level shunting detected by color flow Doppler.  There is a small secundum atrial septal defect with predominantly left to  right shunting across the atrial septum.   ____________________  Echo 08/27/20 IMPRESSIONS  1. Left ventricular ejection fraction, by estimation, is 20 to 25%. The  left ventricle has severely decreased function. The left ventricle  demonstrates global hypokinesis. The left ventricular internal cavity size  was mildly dilated. Left ventricular  diastolic parameters are indeterminate.  2. Right ventricular systolic function is moderately reduced. The right  ventricular size is normal. There is mildly elevated pulmonary artery  systolic pressure. The estimated right ventricular systolic pressure is  14.4 mmHg.  3. Left atrial size was mildly dilated.  4. There is a Mitra-Clip present in the A2-P2 position. Trivial mitral  valve regurgitation. No evidence of mitral stenosis. MG 60mmHg at HR 111  bpm.  5. The aortic valve is tricuspid. Aortic valve regurgitation is mild.  Mild aortic valve sclerosis is present, with no evidence of aortic valve  stenosis.  6. The inferior vena cava is normal in size with greater than 50%  respiratory variability, suggesting right atrial pressure of 3 mmHg.  7. Evidence of atrial level shunting detected by color flow Doppler.  There is a small  atrial septal  defect with predominantly left to right  shunting across the atrial septum.   EKG:  EKG is NOT ordered today.    Recent Labs: 07/18/2020: Magnesium 2.2 07/19/2020: TSH 3.088 07/27/2020: ALT 50; B Natriuretic Peptide 925.9; BUN 18; Creatinine, Ser 1.19; Hemoglobin 12.2; Platelets 155; Potassium 5.1; Sodium 127  Recent Lipid Panel    Component Value Date/Time   CHOL 150 12/12/2019 1123   TRIG 60 12/12/2019 1123   HDL 86 12/12/2019 1123   CHOLHDL 1.7 12/12/2019 1123   VLDL 12 12/12/2019 1123   LDLCALC 52 12/12/2019 1123     Risk Assessment/Calculations:    CHA2DS2-VASc Score = 5  This indicates a 7.2% annual risk of stroke. The patient's score is based upon: CHF History: Yes HTN History: Yes Diabetes History: No Stroke History: No Vascular Disease History: No Age Score: 2 Gender Score: 1      Physical Exam:    VS:  BP 92/60   Pulse 76   Ht 5' 4.5" (1.638 m)   Wt 117 lb (53.1 kg)   SpO2 93%   BMI 19.77 kg/m     Wt Readings from Last 3 Encounters:  08/27/20 117 lb (53.1 kg)  08/05/20 117 lb (53.1 kg)  07/27/20 120 lb (54.4 kg)     GEN:  Well nourished, well developed in no acute distress HEENT: Normal NECK: No JVD; No carotid bruits LYMPHATICS: No lymphadenopathy CARDIAC: RRR, no murmurs, rubs, gallops RESPIRATORY:  Clear to auscultation without rales, wheezing or rhonchi  ABDOMEN: Soft, non-tender, non-distended MUSCULOSKELETAL:  No edema; No deformity  SKIN: Warm and dry NEUROLOGIC:  Alert and oriented x 3 PSYCHIATRIC:  Normal affect   ASSESSMENT:    1. S/P mitral valve clip implantation   2. Chronic combined systolic and diastolic heart failure (HCC)   3. Persistent atrial fibrillation ([place])   4. Depression, unspecified depression type    PLAN:    In order of problems listed above:  Severe MR s/p TEER: echo today shows EF 20-25%, moderate RV dysfunction, normally functioning MitraClip with trivial residual MR and a mean gradient of 2  mm hg. Has antibiotics for sbe prophylaxis from orthopedist (does not remember what but doesn't need a refill). She has NYHA class II symptoms but clinically had a big improvement since surgery. Continue on Eliquis alone. I will see her back in 1 year with an echo.   Chronic combined S/D CHF: followed closely by CHF clinic. Was never able to pick up digoxin and not interested in taking it. Continue Arlyce Harman 25mg  daily and [place]. Taken off entresto due to hypotension. BP is soft today. Appears euvolemic on lasix 40 mg daily. Continue regular follow up with CHF clinic.  Persistant fib s/p recent ablation: has had some palpations recently. Sounds regular on exam today. Continue on Eliquis 2.5mg  BID.   Depression: pt is on Zoloft but prefers citalopram. She says she was recently switched in the hospital - although it looks like she has been on zoloft for quite some time based on my review of old office notes. I told her it would be okay to swtich back to citalopram. She still has a bottle at home and will call us and let us know what dosage she is on so we can update her med list.   Medication Adjustments/Labs and Tests Ordered: Current medicines are reviewed at length with the patient today.  Concerns regarding medicines are outlined above.  No orders of the defined types were placed in this encounter.  No orders of the defined types were placed in this encounter.   Patient Instructions  Medication Instructions:  1) STOP ZOLOFT 2) RESTART CITALOPRAM you have at home. Call us to confirm your dosage to add to your record. *If you need a refill on your cardiac medications before your next appointment, please call your pharmacy*   Follow-Up: Please keep your follow-up appointment with Dr. Aundra Dubin.  You will be called to arrange your 1 year visits!    Signed, Angelena Form, PA-C  08/28/2020 2:10 PM    [facility]

## 2020-09-08 ENCOUNTER — Ambulatory Visit (HOSPITAL_COMMUNITY)
Admission: RE | Admit: 2020-09-08 | Discharge: 2020-09-08 | Disposition: A | Discharge status: Home / Self Care | Payer: Medicare Other | Source: Ambulatory Visit | Attending: Cardiology | Admitting: Cardiology

## 2020-09-08 ENCOUNTER — Other Ambulatory Visit: Payer: Self-pay

## 2020-09-08 ENCOUNTER — Encounter (HOSPITAL_COMMUNITY): Payer: Self-pay | Admitting: Cardiology

## 2020-09-08 VITALS — BP 112/78 | HR 78 | Wt 118.6 lb

## 2020-09-08 DIAGNOSIS — Z7901 Long term (current) use of anticoagulants: Secondary | ICD-10-CM | POA: Insufficient documentation

## 2020-09-08 DIAGNOSIS — I428 Other cardiomyopathies: Secondary | ICD-10-CM | POA: Diagnosis not present

## 2020-09-08 DIAGNOSIS — Z882 Allergy status to sulfonamides status: Secondary | ICD-10-CM | POA: Insufficient documentation

## 2020-09-08 DIAGNOSIS — I2584 Coronary atherosclerosis due to calcified coronary lesion: Secondary | ICD-10-CM | POA: Diagnosis not present

## 2020-09-08 DIAGNOSIS — I5022 Chronic systolic (congestive) heart failure: Secondary | ICD-10-CM | POA: Diagnosis not present

## 2020-09-08 DIAGNOSIS — Z950 Presence of cardiac pacemaker: Secondary | ICD-10-CM | POA: Insufficient documentation

## 2020-09-08 DIAGNOSIS — E039 Hypothyroidism, unspecified: Secondary | ICD-10-CM | POA: Insufficient documentation

## 2020-09-08 DIAGNOSIS — I5020 Unspecified systolic (congestive) heart failure: Secondary | ICD-10-CM | POA: Diagnosis not present

## 2020-09-08 DIAGNOSIS — Z7984 Long term (current) use of oral hypoglycemic drugs: Secondary | ICD-10-CM | POA: Diagnosis not present

## 2020-09-08 DIAGNOSIS — Z95818 Presence of other cardiac implants and grafts: Secondary | ICD-10-CM

## 2020-09-08 DIAGNOSIS — I480 Paroxysmal atrial fibrillation: Secondary | ICD-10-CM | POA: Diagnosis not present

## 2020-09-08 DIAGNOSIS — Z791 Long term (current) use of non-steroidal anti-inflammatories (NSAID): Secondary | ICD-10-CM | POA: Diagnosis not present

## 2020-09-08 DIAGNOSIS — Z85038 Personal history of other malignant neoplasm of large intestine: Secondary | ICD-10-CM | POA: Diagnosis not present

## 2020-09-08 DIAGNOSIS — Z881 Allergy status to other antibiotic agents status: Secondary | ICD-10-CM | POA: Diagnosis not present

## 2020-09-08 DIAGNOSIS — Z87891 Personal history of nicotine dependence: Secondary | ICD-10-CM | POA: Diagnosis not present

## 2020-09-08 DIAGNOSIS — Z79890 Hormone replacement therapy (postmenopausal): Secondary | ICD-10-CM | POA: Insufficient documentation

## 2020-09-08 DIAGNOSIS — I4819 Other persistent atrial fibrillation: Secondary | ICD-10-CM | POA: Diagnosis not present

## 2020-09-08 DIAGNOSIS — Z8249 Family history of ischemic heart disease and other diseases of the circulatory system: Secondary | ICD-10-CM | POA: Insufficient documentation

## 2020-09-08 DIAGNOSIS — I5042 Chronic combined systolic (congestive) and diastolic (congestive) heart failure: Secondary | ICD-10-CM

## 2020-09-08 DIAGNOSIS — R7989 Other specified abnormal findings of blood chemistry: Secondary | ICD-10-CM | POA: Diagnosis not present

## 2020-09-08 DIAGNOSIS — Z8616 Personal history of COVID-19: Secondary | ICD-10-CM | POA: Insufficient documentation

## 2020-09-08 DIAGNOSIS — I110 Hypertensive heart disease with heart failure: Secondary | ICD-10-CM | POA: Insufficient documentation

## 2020-09-08 DIAGNOSIS — Z8543 Personal history of malignant neoplasm of ovary: Secondary | ICD-10-CM | POA: Insufficient documentation

## 2020-09-08 DIAGNOSIS — I2510 Atherosclerotic heart disease of native coronary artery without angina pectoris: Secondary | ICD-10-CM | POA: Diagnosis not present

## 2020-09-08 DIAGNOSIS — Z79899 Other long term (current) drug therapy: Secondary | ICD-10-CM | POA: Diagnosis not present

## 2020-09-08 DIAGNOSIS — Z9889 Other specified postprocedural states: Secondary | ICD-10-CM | POA: Diagnosis not present

## 2020-09-08 LAB — CBC
HCT: 41 % (ref 36.0–46.0)
Hemoglobin: 13.5 g/dL (ref 12.0–15.0)
MCH: 29.8 pg (ref 26.0–34.0)
MCHC: 32.9 g/dL (ref 30.0–36.0)
MCV: 90.5 fL (ref 80.0–100.0)
Platelets: 129 10*3/uL — ABNORMAL LOW (ref 150–400)
RBC: 4.53 MIL/uL (ref 3.87–5.11)
RDW: 20.8 % — ABNORMAL HIGH (ref 11.5–15.5)
WBC: 5.3 10*3/uL (ref 4.0–10.5)
nRBC: 0 % (ref 0.0–0.2)

## 2020-09-08 LAB — COMPREHENSIVE METABOLIC PANEL
ALT: 29 U/L (ref 0–44)
AST: 42 U/L — ABNORMAL HIGH (ref 15–41)
Albumin: 3.6 g/dL (ref 3.5–5.0)
Alkaline Phosphatase: 72 U/L (ref 38–126)
Anion gap: 9 (ref 5–15)
BUN: 11 mg/dL (ref 8–23)
CO2: 25 mmol/L (ref 22–32)
Calcium: 8.5 mg/dL — ABNORMAL LOW (ref 8.9–10.3)
Chloride: 97 mmol/L — ABNORMAL LOW (ref 98–111)
Creatinine, Ser: 0.98 mg/dL (ref 0.44–1.00)
GFR, Estimated: 58 mL/min — ABNORMAL LOW (ref >60)
Glucose, Bld: 86 mg/dL (ref 70–99)
Potassium: 3.5 mmol/L (ref 3.5–5.1)
Sodium: 131 mmol/L — ABNORMAL LOW (ref 135–145)
Total Bilirubin: 1 mg/dL (ref 0.3–1.2)
Total Protein: 7.2 g/dL (ref 6.5–8.1)

## 2020-09-08 LAB — BRAIN NATRIURETIC PEPTIDE: B Natriuretic Peptide: 1067.3 pg/mL — ABNORMAL HIGH (ref 0.0–100.0)

## 2020-09-08 NOTE — Progress Notes (Signed)
Date:  09/08/2020   ID:  [name], DOB December 01, 1935, MRN [id number]  Provider location: [facility] Type of Visit: Established patient   PCP:  [name], MD  Cardiologist:  Dr. Aundra Dubin   History of Present Illness: Cynthia Frank is a 85 y.o. female who has a history of chronic systolic CHF/nonischemic cardiomyopathy, paroxysmal atrial fibrillation, and prior ovarian and colon cancers. She has had a cardiomyopathy known for > 15 years now. Cardiac cath at diagnosis showed mild nonobstructive disease.  EF has been persistently low.  Cause has been thought to be Adriamycin; she received this as part of her ovarian cancer treatment. She has had paroxysmal atrial fibrillation and has remained in NSR with amiodarone use.  No recent atrial fibrillation symptoms, typically feels prolonged palpitations thought to be atrial fibrillation 2-3 times/year.   She had had dyspnea with moderate to heavy exertion for 4-5 years prior to initial appt.  However, just before her initial appt, her symptoms had worsened.  She developed shortness of breath walking short distances on flat ground.  Lasix was increased to 40 mg bid and she lost weight and felt better.     Cardiac MRI was done in 1/17, showing EF 29% with normal RV.  Septal-lateral dyssynchrony was present and there was a non-coronary pattern of LGE in the septum.  LFTs were noted to be mildly elevated.  Amiodarone was decreased to 100 mg daily.   She had Medtronic CRT-D placed in 3/17. Repeat echo 8/17 showed EF 35% with diffuse hypokinesis, normal RV.  Echo in 4/19 showed EF 30-35%, mild AI.   On 11/12/17, she stood up, got lightheaded and passed out briefly with a fall.  Her ICD did not discharge.  She went to the ER and was found to have right C5 transverse process fracture.  She says that she has had lightheadedness with standing for years, comes and goes.  I decreased her Toprol XL.   In 8/20, she  had an episode of lightheadedness with standing followed by syncope and fall with right hip fracture.  She was admitted, had repair of right hip.  She was noted to be orthostatic and Entresto was stopped.  Echo in 8/20 showed EF 45-50% with normal RV systolic function.  In 1/21, patient was in atrial tachycardia and had been in it for several weeks.  She was volume overloaded.  Lasix was increased and she was brought into the hospital for DCCV, but she had converted to NSR on her own.    In 4/21, she had atrial fibrillation ablation.  TEE done in 4/21 showed EF 30-35% with global HK.  She is now off amiodarone.   Echo in 11/21 showed EF 25-30% with mild LV dilation, global hypokinesis with septal-lateral dyssynchrony, moderate MR, moderately decreased RV systolic function with normal RV size, IVC normal.   She went into atrial flutter in 11/21 and had DCCV back to NSR in 1/22.  She stayed in NSR for a few weeks but went back in atrial flutter again.  She had COVID-19 in 2/22.  Redo AF ablation in 3/22.   TEE in 3/22 showed EF 20-25%, moderate RV dysfunction, moderate TR, severe MR.  In 4/22, she had Mitraclip placement.  Repeat echo in 5/22 showed EF 20-25%, moderate RV dysfunction, s/p Mitraclip with mean gradient 2 with trivial MR.    She returns today for followup of CHF.  She is in NSR today.  She  is off metoprolol now and feels much better/less fatigued off it.  Overall feels much better since Mitraclip.  She is more steady on her feet, doing all her usual activities.  No dyspnea walking on flat ground.  Going to beach soon.  No chest pain.  No lightheadedness.  Weight down 2 lbs.   Medtronic device interrogation: Short run of AF early in 5/22 but generally in NSR, stable thoracic impedance, >99% BiV pacing.   ECG (personally reviewed): NSR, BiV pacing  Labs (1/17): K 4.2, creatinine 1.14, BNP 855 Labs (3/17): SPEP negative, BNP 341, K 4.5, creatinine 1.27, AST 62, ALT 74, TSH normal with  low free T3 and normal free T4, HCT 46.5 Labs (4/17): AST 64, ALT 67, BNP 278, K 3.6, creatinine 1.21 Labs (5/17): K 3.4, creatinine 1.39, AST 53, ALT 61, HBV/HCV negative, TSH mildly elevated, BNP 306 Labs (6/17): K 4.4, creatinine 1.36, free T4 normal, free T3 mildly decreased, AST 50, ALT 53. Labs (7/17): ANA negative, ASMA positive Labs (8/17): AST 45, ALT 37, K 3.8, creatinine 1.19, TSH normal Labs (2/18): TSH normal, LDL 47, LFTs, normal Labs (3/18): K normal, creatinine 1.23.  Labs (7/18): K 4, creatinine 1.32, LFTs normal Labs (7/19): K 3.8, creatinine 1.25, hgb 11.6 Labs (8/19): TSH mildly elevated, K 4.2, creatinine 1.22 Labs (10/19): K 4.1, creatinine 1.23, LFTs normal, TSH mildly elevated with normal free T3 and free T4.  Labs (1/20): K 3.5, creatinine 1.15, TSH elevated, free T3 and free T4 low, AST 63, ALT 51 Labs (5/20): K 3.8, creatinine 1.22, TSH normal, AST 49, ALT 43, tbili 0.9 Labs (11/20): K 4, creatinine 1.15  Labs (12/20): K 4.1, creatinine 1.25, TSH normal, AST 51, ALT 50 Labs (1/21): AST 145, ALT 107, tbili 1.8, TSH normal Labs (2/21): K 3.8, creatinine 1.38 Labs (3/21): AST 50, ALT 46 Labs (4/21): K 3.9, creatinine 0.96 Labs (5/21): K 4.5, creatinine 1.11, hgb 13.1, plts 141, AST 65, ALT 53 Labs (8/21): LDL 52, K 3.8, creatinine 1.11, AST 67, ALT 52 Labs (11/21): LFTs normal Labs (12/21): K 3.5, creatinine 1.33, LFTs normal Labs (3/22): K 4.6, creatinine 1.09, hgb 12.2 Labs (4/22): K 5.1, creatinine 1.19  PMH: 1. Chronic systolic CHF: Nonischemic cardiomyopathy, thought to be related to adriamycin that she had for treatment of ovarian cancer.  NICM diagnosed ~ 2001.  - LHC ~ 2001 with 30% stenosis D1.   - Echo (2013) EF 25-30%.  - Echo (10/14) with EF 25-30%. - Echo (12/16) with EF 20-25%, mild LV dilation, restrictive diastolic function, moderate MR, severe LAE. - Cardiac MRI (1/17): Moderately dilated LV with EF 29%. Diffuse hypokinesis looking worse in  the septal wall. Septal-lateral dyssynchrony. Normal RV size and systolic function.  Moderate MR with tethered posterior leaflet.  Mid-wall LGE in the basal to mid septum. This is not a coronary disease pattern. Could be suggestive of prior myocarditis. - Medtronic CRT-D placed 3/17.  - Echo (8/17): EF 35%, diffuse hypokinesis, normal RV size and systolic function, moderate MR.   - Echo (4/19): EF 30-35%, mild AI.  - Echo (8/20): EF 45-50%, diffuse hypokinesis, normal RV size and systolic function. - TEE (4/21): EF 30%, global hypokinesis.  - Echo (11/21): EF 25-30% with mild LV dilation, global hypokinesis with septal-lateral dyssynchrony, moderate MR, moderately decreased RV systolic function with normal RV size, IVC normal.  - Echo (5/22): EF 20-25%, moderate RV dysfunction, s/p Mitraclip with mean gradient 2 with trivial MR. 2. Atrial fibrillation/atrial tachycardia: Paroxysmal.  -  4/21 atrial fibrillation ablation. - 3/22 redo AF ablation  3. HTN 4. Ovarian cancer: 1985, s/p surgery. Received Adriamycin-based chemotherapy.  5. Colon cancer: 2007, s/p surgery.  6. PVCs 7. GERD 8. PFTs (8/16) without significant abnormality. 9. Prior syncope 10. Elevated transaminases: Uncertain etiology.  - Abdominal US (12/20): Normal-appearing liver.  11. COVID-19 infection: 2/22.  12. Atrial flutter: DCCV in 1/22.  13. Mitral regurgitation: Primarily functional.  S/p Mitraclip in 4/22.   Current Outpatient Medications  Medication Sig Dispense Refill  . acetaminophen (TYLENOL) 500 MG tablet Take 500 mg by mouth every 6 (six) hours as needed for moderate pain or headache.    Marland Kitchen atorvastatin (LIPITOR) 40 MG tablet TAKE 1 TABLET(40 MG) BY MOUTH DAILY AT 6 PM 90 tablet 3  . citalopram (CELEXA) 20 MG tablet Take 20 mg by mouth daily.    . clobetasol (TEMOVATE) 0.05 % external solution Apply 1 application topically 2 (two) times daily.    . Coenzyme Q10 (COQ10) 100 MG CAPS Take 100 mg by mouth daily.     Marland Kitchen conjugated estrogens (PREMARIN) vaginal cream Place 1 Applicatorful vaginally daily as needed (dyspareunia (Use as directed)).     . dapagliflozin propanediol (FARXIGA) 10 MG TABS tablet Take 1 tablet (10 mg total) by mouth daily before breakfast. 30 tablet 6  . diclofenac Sodium (VOLTAREN) 1 % GEL Apply 2 g topically daily.    Marland Kitchen ELIQUIS 2.5 MG TABS tablet TAKE 1 TABLET(2.5 MG) BY MOUTH TWICE DAILY 180 tablet 2  . furosemide (LASIX) 40 MG tablet Take 1 tablet (40 mg total) by mouth daily. 30 tablet 5  . ipratropium (ATROVENT) 0.03 % nasal spray Place 1 spray into both nostrils 2 (two) times daily.    Marland Kitchen ketoconazole (NIZORAL) 2 % shampoo Apply 1 application topically 3 (three) times a week.    . levothyroxine (SYNTHROID) 25 MCG tablet TAKE 1 TABLET(25 MCG) BY MOUTH DAILY BEFORE BREAKFAST 30 tablet 6  . Magnesium Oxide 500 MG CAPS Take 1,000 mg by mouth 2 (two) times daily.     . mupirocin ointment (BACTROBAN) 2 % Place 1 application into the nose 2 (two) times daily. 1 g 0  . potassium chloride SA (KLOR-CON) 20 MEQ tablet Take 0.5 tablets (10 mEq total) by mouth daily. 90 tablet 3  . spironolactone (ALDACTONE) 25 MG tablet TAKE 1 TABLET(25 MG) BY MOUTH AT BEDTIME 90 tablet 3  . zolpidem (AMBIEN) 5 MG tablet Take 1 tablet (5 mg total) by mouth at bedtime as needed for sleep. 30 tablet 3   No current facility-administered medications for this encounter.    Allergies:   Clindamycin/lincomycin, Dramamine ii [meclizine hcl], Pneumococcal vaccines, and Sulfonamide derivatives   Social History:  The patient  reports that she quit smoking about 42 years ago. Her smoking use included cigarettes. She has a 10.00 pack-year smoking history. She has never used smokeless tobacco. She reports current alcohol use of about 14.0 standard drinks of alcohol per week. She reports that she does not use drugs.   Family History:  The patient's family history includes Heart attack in her father; Heart disease in her  father; Prostate cancer in her father; Uterine cancer in her mother.   ROS:  Please see the history of present illness.   All other systems are personally reviewed and negative.   Exam:  BP 112/78   Pulse 78   Wt 53.8 kg (118 lb 9.6 oz)   SpO2 97%   BMI 20.04 kg/m  General: NAD Neck: No JVD, no thyromegaly or thyroid nodule.  Lungs: Clear to auscultation bilaterally with normal respiratory effort. CV: Nondisplaced PMI.  Heart regular S1/S2, no S3/S4, no murmur.  No peripheral edema.  No carotid bruit.  Normal pedal pulses.  Abdomen: Soft, nontender, no hepatosplenomegaly, no distention.  Skin: Intact without lesions or rashes.  Neurologic: Alert and oriented x 3.  Psych: Normal affect. Extremities: No clubbing or cyanosis.  HEENT: Normal.   Recent Labs: 07/18/2020: Magnesium 2.2 07/19/2020: TSH 3.088 09/08/2020: ALT 29; B Natriuretic Peptide 1,067.3; BUN 11; Creatinine, Ser 0.98; Hemoglobin 13.5; Platelets 129; Potassium 3.5; Sodium 131  Personally reviewed   Wt Readings from Last 3 Encounters:  09/08/20 53.8 kg (118 lb 9.6 oz)  08/27/20 53.1 kg (117 lb)  08/05/20 53.1 kg (117 lb)    ASSESSMENT AND PLAN:  1. Chronic systolic CHF: EF 26-37% with diffuse hypokinesis on 12/16 echo.  Nonischemic cardiomyopathy, probably related to Adriamycin with ovarian cancer treatment. Cardiac MRI in 1/17 showed EF 29% with normal RV and septal-lateral dyssynchrony.  There was a non-cardiac pattern of LGE in the septum, cannot rule out prior myocarditis based on this pattern.  She has a Medtronic CRT-D device. Echo 4/19 with EF 30-35%, then echo in 8/20 showed EF up to 45-50%.  TEE in 3/21 showed EF 30-35%.  Echo in 11/21 showed EF 25-30% with mild LV dilation, global hypokinesis with septal-lateral dyssynchrony, moderate MR, moderately decreased RV systolic function with normal RV size, IVC normal.  In 4/22, she had Mitraclip placement. In 5/22, echo showed EF 20-25%, moderate RV dysfunction, s/p  Mitraclip with mean gradient 2 with trivial MR. She is in NSR today, she tolerates AF poorly.  NYHA class II, improved since Mitraclip.  Not volume overloaded by exam or Optivol.  - She is off Toprol XL, she feels like it makes her excessively fatigued.  - Continue spironolactone 25 mg daily.  - She will stay off Entresto given syncope and lightheadedness while taking it.  Will not start losartan with soft BP.  - Continue Lasix 40 mg daily, BMET today.  - Continue Farxiga 10 mg daily.  2. Atrial arrhythmias: She needs to maintain NSR as she loses BiV pacing when she goes into atrial arrhythmias, triggering CHF exacerbation.  She had atrial fibrillation ablation in 4/21.  She went into atrial flutter in 11/21, she had DCCV in 12/21.  She is off amiodarone with elevated LFTs.  She is now in NSR, appears to be maintaining NSR since Mitraclip placement (improved MR, less drive for AF) and redo AF ablation in 3/22.  - Continue apixaban, CBC today. 3. Hypothyroidism: Suspect related to prior amiodarone use.   - Continue Levoxyl.  4. Elevated LFTs: Mild elevation in past, resolved in 11/21.  Abdominal US showed unremarkable liver.  ?Related to amiodarone.  She is now back off amiodarone.  She has seen GI. Most recent LFTs were near normal.  - Check LFTs with labs today.   5. CAD: Calcification noted in coronaries with pulmonary vein CT done prior to afib ablation.  - Continue atorvastatin 40 daily, good lipids in 8/21.  - No ASA given apixaban use.   Followup in 2 months.   Signed, [name], MD  09/08/2020  [facility] [address] [facility] [place] [address] [phone] (office) [phone] (fax)

## 2020-09-08 NOTE — Patient Instructions (Addendum)
EKG done today.  Labs done today. We will contact you only if your labs are abnormal.  No medication changes were made. Please continue all current medications as prescribed.  Your physician recommends that you schedule a follow-up appointment in: 2 months  If you have any questions or concerns before your next appointment please send [place] a message through [place] or call our office at [phone].    TO LEAVE A MESSAGE FOR THE NURSE SELECT OPTION 2, PLEASE LEAVE A MESSAGE INCLUDING: . YOUR NAME . DATE OF BIRTH . CALL BACK NUMBER . REASON FOR CALL**this is important as we prioritize the call backs  YOU WILL RECEIVE A CALL BACK THE SAME DAY AS LONG AS YOU CALL BEFORE 4:00 PM   Do the following things EVERYDAY: 1) Weigh yourself in the morning before breakfast. Write it down and keep it in a log. 2) Take your medicines as prescribed 3) Eat low salt foods--Limit salt (sodium) to 2000 mg per day.  4) Stay as active as you can everyday 5) Limit all fluids for the day to less than 2 liters   At the [facility], you and your health needs are our priority. As part of our continuing mission to provide you with exceptional heart care, we have created designated Provider Care Teams. These Care Teams include your primary Cardiologist (physician) and Advanced Practice Providers (APPs- Physician Assistants and Nurse Practitioners) who all work together to provide you with the care you need, when you need it.   You may see any of the following providers on your designated Care Team at your next follow up: Marland Kitchen Dr Glori Bickers . Dr [name] . Darrick Grinder, NP . Lyda Jester, PA . Audry Riles, PharmD   Please be sure to bring in all your medications bottles to every appointment.

## 2020-09-21 ENCOUNTER — Telehealth: Payer: Self-pay | Admitting: Internal Medicine

## 2020-09-21 ENCOUNTER — Ambulatory Visit (INDEPENDENT_AMBULATORY_CARE_PROVIDER_SITE_OTHER): Payer: Medicare Other

## 2020-09-21 DIAGNOSIS — Z95810 Presence of automatic (implantable) cardiac defibrillator: Secondary | ICD-10-CM | POA: Diagnosis not present

## 2020-09-21 DIAGNOSIS — I5042 Chronic combined systolic (congestive) and diastolic (congestive) heart failure: Secondary | ICD-10-CM

## 2020-09-21 NOTE — Progress Notes (Signed)
  Chronic Care Management   Outreach Note  09/21/2020 Name: Cynthia Frank MRN: [id number] DOB: 1935-04-30  Referred by: [name], MD Reason for referral : No chief complaint on file.   An unsuccessful telephone outreach was attempted today. The patient was referred to the pharmacist for assistance with care management and care coordination.   Follow Up Plan:   Lauretta Grill Upstream Scheduler

## 2020-09-22 ENCOUNTER — Telehealth: Payer: Self-pay | Admitting: Internal Medicine

## 2020-09-22 NOTE — Chronic Care Management (AMB) (Signed)
  Chronic Care Management   Note  09/22/2020 Name: Cynthia Frank MRN: [id number] DOB: 1936-03-18  Cynthia Frank is a 86 y.o. year old female who is a primary care patient of [name], MD. I reached out to [name] by phone today in response to a referral sent by Cynthia Frank PCP, [name], MD.   Cynthia Frank was given information about Chronic Care Management services today including:  1. CCM service includes personalized support from designated clinical staff supervised by her physician, including individualized plan of care and coordination with other care providers 2. 24/7 contact phone numbers for assistance for urgent and routine care needs. 3. Service will only be billed when office clinical staff spend 20 minutes or more in a month to coordinate care. 4. Only one practitioner may furnish and bill the service in a calendar month. 5. The patient may stop CCM services at any time (effective at the end of the month) by phone call to the office staff.   Patient agreed to services and verbal consent obtained.   Follow up plan:   Lauretta Grill Upstream Scheduler

## 2020-09-22 NOTE — Progress Notes (Signed)
EPIC Encounter for ICM Monitoring  Patient Name: Cynthia Frank is a 85 y.o. female Date: 09/22/2020 Primary Care Physican: [name], MD Primary Cardiologist:[place] Electrophysiologist:Taylor Bi-V Pacing: 99.3% 6/7/2022Office Weight: 115lbs  AT/AF 3 Time in AT/AF 0.2 hr/day (0.6%) Longest AT/AF 45 minutes  Battery ERI 9 months   Spoke with patient and reports feeling well at this time.  She reports doing well since having procedures done in April and May.  She is glad her heart is back in rhythm.  Opitvol thoracic impedancesuggestingfluid levels normal.  Prescribed:   Furosemide40 mgTake 1 tablet (40 mg total) by mouth daily  Potassium 20 mEq take 0.5 tablet daily  Spironolactone 25 mg take 1 tablet daily.  Labs: 09/08/2020 Creatinine 0.98, BUN 11, Potassium 3.5, Sodium 131, GFR 58 07/27/2020 Creatinine 1.19, BUN 18, Potassium 5.1, Sodium 127, GFR 46 07/24/2020 Creatinine 0.93, BUN 16, Potassium 3.6, Sodium 129, GFR >60  07/22/2020 Creatinine 1.30, BUN 18, Potassium 3.4, Sodium 131, GFR 41  07/21/2020 Creatinine 1.24, BUN 20, Potassium 3.8, Sodium 127, GFR 43  A complete set of results can be found in Results Review.  Recommendations: No changes and encouraged to call if experiencing any fluid symptoms.  Follow-up plan: ICM clinic phone appointment on7/13/2022. 91 day device clinic remote transmission7/03/2021.   EP/Cardiology Office Visits:  11/03/2020 with Dr Curt Bears  Copy of ICM check sent to Dr.Taylor.  3 month ICM trend: 09/21/2020.    1 Year ICM trend:       Rosalene Billings, RN 09/22/2020 4:45 PM

## 2020-10-05 ENCOUNTER — Other Ambulatory Visit (HOSPITAL_COMMUNITY): Payer: Self-pay | Admitting: Family Medicine

## 2020-10-05 ENCOUNTER — Encounter (HOSPITAL_COMMUNITY): Payer: Self-pay

## 2020-10-05 DIAGNOSIS — H5203 Hypermetropia, bilateral: Secondary | ICD-10-CM | POA: Diagnosis not present

## 2020-10-05 DIAGNOSIS — H0012 Chalazion right lower eyelid: Secondary | ICD-10-CM | POA: Diagnosis not present

## 2020-10-05 DIAGNOSIS — H0011 Chalazion right upper eyelid: Secondary | ICD-10-CM | POA: Diagnosis not present

## 2020-10-05 DIAGNOSIS — H2513 Age-related nuclear cataract, bilateral: Secondary | ICD-10-CM | POA: Diagnosis not present

## 2020-10-05 MED ORDER — AMOXICILLIN 500 MG PO TABS: 2000.0000 mg | ORAL_TABLET | Freq: Once | ORAL | 3 refills | Status: AC | PRN

## 2020-10-06 DIAGNOSIS — Z23 Encounter for immunization: Secondary | ICD-10-CM | POA: Diagnosis not present

## 2020-10-10 ENCOUNTER — Other Ambulatory Visit (HOSPITAL_COMMUNITY): Payer: Self-pay | Admitting: Cardiology

## 2020-10-13 ENCOUNTER — Ambulatory Visit: Payer: Medicare Other | Admitting: Cardiology

## 2020-10-14 DIAGNOSIS — H903 Sensorineural hearing loss, bilateral: Secondary | ICD-10-CM | POA: Diagnosis not present

## 2020-10-14 DIAGNOSIS — Z461 Encounter for fitting and adjustment of hearing aid: Secondary | ICD-10-CM | POA: Diagnosis not present

## 2020-10-20 DIAGNOSIS — H0011 Chalazion right upper eyelid: Secondary | ICD-10-CM | POA: Diagnosis not present

## 2020-10-27 ENCOUNTER — Other Ambulatory Visit (HOSPITAL_COMMUNITY): Payer: Self-pay | Admitting: Cardiology

## 2020-10-27 ENCOUNTER — Ambulatory Visit (INDEPENDENT_AMBULATORY_CARE_PROVIDER_SITE_OTHER): Payer: Medicare Other

## 2020-10-27 DIAGNOSIS — I428 Other cardiomyopathies: Secondary | ICD-10-CM

## 2020-10-27 LAB — CUP PACEART REMOTE DEVICE CHECK
Battery Remaining Longevity: 11 mo
Battery Voltage: 2.86 V
Brady Statistic AP VP Percent: 5.52 %
Brady Statistic AP VS Percent: 0.05 %
Brady Statistic AS VP Percent: 89.15 %
Brady Statistic AS VS Percent: 5.28 %
Brady Statistic RA Percent Paced: 5.5 %
Brady Statistic RV Percent Paced: 93.6 %
Date Time Interrogation Session: 20220712022602
HighPow Impedance: 65 Ohm
Implantable Lead Implant Date: 20170328
Implantable Lead Implant Date: 20170328
Implantable Lead Implant Date: 20170328
Implantable Lead Location: 753858
Implantable Lead Location: 753859
Implantable Lead Location: 753860
Implantable Lead Model: 4598
Implantable Lead Model: 5076
Implantable Lead Model: 6935
Implantable Pulse Generator Implant Date: 20170328
Lead Channel Impedance Value: 285 Ohm
Lead Channel Impedance Value: 285 Ohm
Lead Channel Impedance Value: 304 Ohm
Lead Channel Impedance Value: 342 Ohm
Lead Channel Impedance Value: 361 Ohm
Lead Channel Impedance Value: 361 Ohm
Lead Channel Impedance Value: 399 Ohm
Lead Channel Impedance Value: 475 Ohm
Lead Channel Impedance Value: 513 Ohm
Lead Channel Impedance Value: 513 Ohm
Lead Channel Impedance Value: 551 Ohm
Lead Channel Impedance Value: 589 Ohm
Lead Channel Impedance Value: 608 Ohm
Lead Channel Pacing Threshold Amplitude: 0.875 V
Lead Channel Pacing Threshold Amplitude: 1 V
Lead Channel Pacing Threshold Amplitude: 1.125 V
Lead Channel Pacing Threshold Pulse Width: 0.4 ms
Lead Channel Pacing Threshold Pulse Width: 0.4 ms
Lead Channel Pacing Threshold Pulse Width: 0.4 ms
Lead Channel Sensing Intrinsic Amplitude: 1.25 mV
Lead Channel Sensing Intrinsic Amplitude: 1.25 mV
Lead Channel Sensing Intrinsic Amplitude: 13.625 mV
Lead Channel Sensing Intrinsic Amplitude: 13.625 mV
Lead Channel Setting Pacing Amplitude: 2 V
Lead Channel Setting Pacing Amplitude: 2.25 V
Lead Channel Setting Pacing Amplitude: 2.5 V
Lead Channel Setting Pacing Pulse Width: 0.4 ms
Lead Channel Setting Pacing Pulse Width: 0.4 ms
Lead Channel Setting Sensing Sensitivity: 0.3 mV

## 2020-10-28 ENCOUNTER — Ambulatory Visit (INDEPENDENT_AMBULATORY_CARE_PROVIDER_SITE_OTHER): Payer: Medicare Other

## 2020-10-28 ENCOUNTER — Other Ambulatory Visit (HOSPITAL_COMMUNITY): Payer: Self-pay | Admitting: *Deleted

## 2020-10-28 DIAGNOSIS — Z95810 Presence of automatic (implantable) cardiac defibrillator: Secondary | ICD-10-CM | POA: Diagnosis not present

## 2020-10-28 DIAGNOSIS — Z85828 Personal history of other malignant neoplasm of skin: Secondary | ICD-10-CM | POA: Diagnosis not present

## 2020-10-28 DIAGNOSIS — L218 Other seborrheic dermatitis: Secondary | ICD-10-CM | POA: Diagnosis not present

## 2020-10-28 DIAGNOSIS — I5042 Chronic combined systolic (congestive) and diastolic (congestive) heart failure: Secondary | ICD-10-CM | POA: Diagnosis not present

## 2020-10-28 DIAGNOSIS — L820 Inflamed seborrheic keratosis: Secondary | ICD-10-CM | POA: Diagnosis not present

## 2020-10-28 DIAGNOSIS — L718 Other rosacea: Secondary | ICD-10-CM | POA: Diagnosis not present

## 2020-10-28 MED ORDER — LEVOTHYROXINE SODIUM 25 MCG PO TABS: ORAL_TABLET | ORAL | 6 refills | Status: AC

## 2020-10-30 ENCOUNTER — Telehealth: Payer: Self-pay

## 2020-10-30 NOTE — Telephone Encounter (Signed)
Remote ICM transmission received.  Attempted call to patient regarding ICM remote transmission and no answer or voice mail option.  

## 2020-10-30 NOTE — Progress Notes (Signed)
EPIC Encounter for ICM Monitoring  Patient Name: Cynthia Frank is a 85 y.o. female Date: 10/30/2020 Primary Care Physican: [name], MD Primary Cardiologist: Aundra Dubin Electrophysiologist: Santina Evans Pacing:  93.6%       09/22/2020 Office Weight: 115 lbs    Since September 21, 2020 AT/AF 326   (09/21/2020 report shows 3 episodes) Time in AT/AF 2.4 hr/day (10.1%)  (09/21/2020 report shows 0.2 hr/day (0.6%) Longest AT/AF 2 hours (09/21/2020 report shows 45 minutes)   Battery ERI 11 months           Attempted call to patient and unable to reach. Transmission reviewed.   Patient has 11/03/2020 OV with Dr Curt Bears regarding Afib.   Opitvol thoracic impedance suggesting fluid levels normal.       Prescribed: Furosemide 40 mg Take 1 tablet (40 mg total) by mouth daily Potassium 20 mEq take 0.5 tablet daily Spironolactone 25 mg take 1 tablet daily.   Labs: 09/08/2020 Creatinine 0.98, BUN 11, Potassium 3.5, Sodium 131, GFR 58 07/27/2020 Creatinine 1.19, BUN 18, Potassium 5.1, Sodium 127, GFR 46 07/24/2020 Creatinine 0.93, BUN 16, Potassium 3.6, Sodium 129, GFR >60 07/22/2020 Creatinine 1.30, BUN 18, Potassium 3.4, Sodium 131, GFR 41 07/21/2020 Creatinine 1.24, BUN 20, Potassium 3.8, Sodium 127, GFR 43 A complete set of results can be found in Results Review.   Recommendations: Unable to reach.     Follow-up plan: ICM clinic phone appointment on 11/30/2020.   91 day device clinic remote transmission 01/26/2021.     EP/Cardiology Office Visits:    11/03/2020 with Dr Curt Bears to discuss Afib/Ablation.  11/09/2020 with Dr Aundra Dubin.   Copy of ICM check sent to Dr. Lovena Le.   3 month ICM trend: 10/27/2020.    1 Year ICM trend:       Rosalene Billings, RN 10/30/2020 11:29 AM

## 2020-11-03 ENCOUNTER — Other Ambulatory Visit: Payer: Self-pay

## 2020-11-03 ENCOUNTER — Ambulatory Visit (INDEPENDENT_AMBULATORY_CARE_PROVIDER_SITE_OTHER): Payer: Medicare Other | Admitting: Cardiology

## 2020-11-03 ENCOUNTER — Encounter: Payer: Self-pay | Admitting: Cardiology

## 2020-11-03 VITALS — BP 100/60 | HR 100 | Ht 65.0 in | Wt 119.6 lb

## 2020-11-03 DIAGNOSIS — I4819 Other persistent atrial fibrillation: Secondary | ICD-10-CM | POA: Diagnosis not present

## 2020-11-03 NOTE — Progress Notes (Signed)
Electrophysiology Office Note   Date:  11/03/2020   ID:  Cynthia, Frank 1935-12-21, MRN [id number]  PCP:  [name], MD  Cardiologist:  Aundra Dubin Primary Electrophysiologist:  Jacky Dross Meredith Leeds, MD    Chief Complaint: CHF   History of Present Illness: Cynthia Frank is a 85 y.o. female who is being seen today for the evaluation of CHF at the request of [name], *. Presenting today for electrophysiology evaluation.  She has a history of chronic systolic heart failure due to nonischemic cardiomyopathy, and paroxysmal atrial fibrillation.  She is status post Medtronic CRT-D implanted 07/06/2015.  She had an atrial fibrillation ablation 08/14/2019.  She had repeat ablation 07/15/2020.  On her preprocedure TEE, she was found to have severe mitral regurgitation and is now status post mitral valve clip on 07/23/2020. Today, denies symptoms of palpitations, chest pain, shortness of breath, orthopnea, PND, lower extremity edema, claudication, dizziness, presyncope, syncope, bleeding, or neurologic sequela. The patient is tolerating medications without difficulties.  She is feeling well.  She has no chest pain or shortness of breath.  She is able to do all her daily activities without restriction.  Unfortunately she is in atrial flutter today.  She has been over the last couple weeks.  She does state that she feels better today than she has in the last few months.   Past Medical History:  Diagnosis Date   AICD (automatic cardioverter/defibrillator) present    Arthritis    "left ankle" (07/14/2015)   Atrial fibrillation (HCC)    Cataract    bilateral -not a surgical candidate   CHF (congestive heart failure) ([place])    Colon cancer ([place]) 03/2006   T3, N0   Colon polyps 12/07/2010   Coronary artery disease    nonobstructive with 30% D1   Diabetes mellitus without complication (HCC)    on meds   GERD (gastroesophageal reflux disease)    OTC PRN -diet controlled    History of ovarian cancer 1985   Hx: UTI (urinary tract infection)    Hyperlipidemia    on meds   Hypertension    on meds   Internal hemorrhoid    Nonischemic cardiomyopathy ([place] Frank)    last EF assessment 40% by MUGA   Osteoporosis    Prolia once per year   Partial bowel obstruction (HCC)    PVC (premature ventricular contraction)    S/P mitral valve clip implantation 07/23/2020   s/p TEER with a  MitraClip G4 NTW device positioned A2/P2 by Dr. Burt Knack   Thyroid disease    on meds   Past Surgical History:  Procedure Laterality Date   ABDOMINAL HYSTERECTOMY  1979   ANKLE CLOSED REDUCTION  10/16/2011   Procedure: CLOSED REDUCTION ANKLE;  Surgeon: Tobi Bastos, MD;  Location: [facility];  Service: Orthopedics;  Laterality: Left;   ATRIAL FIBRILLATION ABLATION N/A 08/14/2019   Procedure: ATRIAL FIBRILLATION ABLATION;  Surgeon: Constance Haw, MD;  Location: [place] [facility];  Service: Cardiovascular;  Laterality: N/A;   ATRIAL FIBRILLATION ABLATION N/A 07/15/2020   Procedure: ATRIAL FIBRILLATION ABLATION;  Surgeon: Constance Haw, MD;  Location: [place] [facility];  Service: Cardiovascular;  Laterality: N/A;   CARDIOVERSION N/A 04/20/2020   Procedure: CARDIOVERSION;  Surgeon: Larey Dresser, MD;  Location: [facility];  Service: Cardiovascular;  Laterality: N/A;   COLECTOMY  2007   for colon cancer   COLONOSCOPY N/A 03/11/2013   Procedure: COLONOSCOPY;  Surgeon: Ladene Artist,  MD;  Location: [facility];  Service: Endoscopy;  Laterality: N/A;   COLONOSCOPY  2019   MS-plenvu(good)-int hems-SSA x 2   COLONOSCOPY WITH PROPOFOL N/A 04/21/2015   Procedure: COLONOSCOPY WITH PROPOFOL;  Surgeon: Ladene Artist, MD;  Location: [facility];  Service: Endoscopy;  Laterality: N/A;   COLONOSCOPY WITH PROPOFOL N/A 02/05/2018   Procedure: COLONOSCOPY WITH PROPOFOL Hemostasis Clip;  Surgeon: Ladene Artist, MD;  Location: [facility];  Service: Endoscopy;  Laterality: N/A;   EP  IMPLANTABLE DEVICE N/A 07/14/2015   Procedure: BiV ICD Insertion CRT-D;  Surgeon: Frank Lance, MD;  Location: [place] [facility];  Service: Cardiovascular;  Laterality: N/A;   ESOPHAGOGASTRODUODENOSCOPY (EGD) WITH PROPOFOL N/A 04/21/2015   Procedure: ESOPHAGOGASTRODUODENOSCOPY (EGD) WITH PROPOFOL;  Surgeon: Ladene Artist, MD;  Location: [facility];  Service: Endoscopy;  Laterality: N/A;   FRACTURE SURGERY     INSERTION OF ICD Left 07/14/2015   LAPAROTOMY  1980   "adhesions"   LAPAROTOMY  1989   laparotomy for takedown of intestinal obstruction secondary to adhesions    MITRAL VALVE REPAIR N/A 07/23/2020   Procedure: MITRAL VALVE REPAIR;  Surgeon: Cynthia Mocha, MD;  Location: [place] [facility];  Service: Cardiovascular;  Laterality: N/A;   Cynthia Frank   resection of right ovarian cancer   POLYPECTOMY  02/05/2018   Procedure: POLYPECTOMY;  Surgeon: Ladene Artist, MD;  Location: [facility];  Service: Endoscopy;;   POLYPECTOMY  2018   SSA x 2   RIGHT HEART CATH N/A 07/22/2020   Procedure: RIGHT HEART CATH;  Surgeon: Larey Dresser, MD;  Location: [place] [facility];  Service: Cardiovascular;  Laterality: N/A;   SMALL INTESTINE SURGERY  1985   TEE WITHOUT CARDIOVERSION N/A 08/14/2019   Procedure: TRANSESOPHAGEAL ECHOCARDIOGRAM (TEE);  Surgeon: Constance Haw, MD;  Location: [place] [facility];  Service: Cardiovascular;  Laterality: N/A;   TEE WITHOUT CARDIOVERSION N/A 07/15/2020   Procedure: TRANSESOPHAGEAL ECHOCARDIOGRAM (TEE);  Surgeon: Constance Haw, MD;  Location: [place] [facility];  Service: Cardiovascular;  Laterality: N/A;   TEE WITHOUT CARDIOVERSION N/A 07/23/2020   Procedure: TRANSESOPHAGEAL ECHOCARDIOGRAM (TEE);  Surgeon: Cynthia Mocha, MD;  Location: [place] [facility];  Service: Cardiovascular;  Laterality: N/A;   TONSILLECTOMY  1944   TOTAL HIP ARTHROPLASTY Right 12/18/2018   Procedure: TOTAL HIP ARTHROPLASTY ANTERIOR  APPROACH;  Surgeon: Rod Can, MD;  Location: [facility];  Service: Orthopedics;  Laterality: Right;     Current Outpatient Medications  Medication Sig Dispense Refill   acetaminophen (TYLENOL) 500 MG tablet Take 500 mg by mouth every 6 (six) hours as needed for moderate pain or headache.     amoxicillin (AMOXIL) 500 MG tablet Take 4 tablets (2,000 mg total) by mouth once as needed for up to 1 dose (take 1 hour before dental procedure). 4 tablet 3   atorvastatin (LIPITOR) 40 MG tablet TAKE 1 TABLET(40 MG) BY MOUTH DAILY AT 6 PM 90 tablet 3   citalopram (CELEXA) 20 MG tablet Take 20 mg by mouth daily.     clobetasol (TEMOVATE) 0.05 % external solution Apply 1 application topically 2 (two) times daily.     Coenzyme Q10 (COQ10) 100 MG CAPS Take 100 mg by mouth daily.     conjugated estrogens (PREMARIN) vaginal cream Place 1 Applicatorful vaginally daily as needed (dyspareunia (Use as directed)).      diclofenac Sodium (VOLTAREN) 1 % GEL Apply 2 g  topically daily.     ELIQUIS 2.5 MG TABS tablet TAKE 1 TABLET(2.5 MG) BY MOUTH TWICE DAILY 180 tablet 2   FARXIGA 10 MG TABS tablet TAKE 1 TABLET(10 MG) BY MOUTH DAILY BEFORE AND BREAKFAST 30 tablet 6   FARXIGA 10 MG TABS tablet TAKE 1 TABLET(10 MG) BY MOUTH DAILY BEFORE BREAKFAST 30 tablet 6   furosemide (LASIX) 40 MG tablet Take 1 tablet (40 mg total) by mouth daily. 30 tablet 5   ipratropium (ATROVENT) 0.03 % nasal spray Place 1 spray into both nostrils 2 (two) times daily.     ketoconazole (NIZORAL) 2 % shampoo Apply 1 application topically 3 (three) times a week.     levothyroxine (SYNTHROID) 25 MCG tablet TAKE 1 TABLET(25 MCG) BY MOUTH DAILY BEFORE BREAKFAST 30 tablet 6   Magnesium Oxide 500 MG CAPS Take 1,000 mg by mouth 2 (two) times daily.      mupirocin ointment (BACTROBAN) 2 % Place 1 application into the nose 2 (two) times daily. 1 g 0   ofloxacin (OCUFLOX) 0.3 % ophthalmic solution Place 1 drop into the right eye 3 (three) times daily.      potassium chloride SA (KLOR-CON) 20 MEQ tablet Take 0.5 tablets (10 mEq total) by mouth daily. 90 tablet 3   spironolactone (ALDACTONE) 25 MG tablet TAKE 1 TABLET(25 MG) BY MOUTH AT BEDTIME 90 tablet 3   zolpidem (AMBIEN) 5 MG tablet Take 1 tablet (5 mg total) by mouth at bedtime as needed for sleep. 30 tablet 3   No current facility-administered medications for this visit.    Allergies:   Clindamycin/lincomycin, Dramamine ii [meclizine hcl], Pneumococcal vaccines, and Sulfonamide derivatives   Social History:  The patient  reports that she quit smoking about 42 years ago. Her smoking use included cigarettes. She has a 10.00 pack-year smoking history. She has never used smokeless tobacco. She reports current alcohol use of about 14.0 standard drinks of alcohol per week. She reports that she does not use drugs.   Family History:  The patient's family history includes Heart attack in her father; Heart disease in her father; Prostate cancer in her father; Uterine cancer in her mother.   ROS:  Please see the history of present illness.   Otherwise, review of systems is positive for none.   All other systems are reviewed and negative.   PHYSICAL EXAM: VS:  BP 100/60   Pulse 100   Ht 5\' 5"  (1.651 m)   Wt 119 lb 9.6 oz (54.3 kg)   SpO2 97%   BMI 19.90 kg/m  , BMI Body mass index is 19.9 kg/m. GEN: Well nourished, well developed, in no acute distress  HEENT: normal  Neck: no JVD, carotid bruits, or masses Cardiac: irregular; no murmurs, rubs, or gallops,no edema  Respiratory:  clear to auscultation bilaterally, normal work of breathing GI: soft, nontender, nondistended, + BS MS: no deformity or atrophy  Skin: warm and dry, device site well healed Neuro:  Strength and sensation are intact Psych: euthymic mood, full affect  EKG:  EKG is ordered today. Personal review of the ekg ordered  shows atrial flutter, rate 100  Personal review of the device interrogation today. Results in  [place]: 07/18/2020: Magnesium 2.2 07/19/2020: TSH 3.088 09/08/2020: ALT 29; B Natriuretic Peptide 1,067.3; BUN 11; Creatinine, Ser 0.98; Hemoglobin 13.5; Platelets 129; Potassium 3.5; Sodium 131    Lipid Panel     Component Value Date/Time   CHOL 150 12/12/2019 1123  TRIG 60 12/12/2019 1123   HDL 86 12/12/2019 1123   CHOLHDL 1.7 12/12/2019 1123   VLDL 12 12/12/2019 1123   LDLCALC 52 12/12/2019 1123     Wt Readings from Last 3 Encounters:  11/03/20 119 lb 9.6 oz (54.3 kg)  09/08/20 118 lb 9.6 oz (53.8 kg)  08/27/20 117 lb (53.1 kg)      Other studies Reviewed: Additional studies/ records that were reviewed today include: TTE 08/28/2018 Review of the above records today demonstrates:   1. Left ventricular ejection fraction, by estimation, is 20 to 25%. The  left ventricle has severely decreased function. The left ventricle  demonstrates global hypokinesis. The left ventricular internal cavity size  was mildly dilated. Left ventricular  diastolic parameters are indeterminate.   2. Right ventricular systolic function is moderately reduced. The right  ventricular size is normal. There is mildly elevated pulmonary artery  systolic pressure. The estimated right ventricular systolic pressure is  27.2 mmHg.   3. Left atrial size was mildly dilated.   4. There is a Mitra-Clip present in the A2-P2 position. Trivial mitral  valve regurgitation. No evidence of mitral stenosis. MG 74mmHg at HR 111  bpm.   5. The aortic valve is tricuspid. Aortic valve regurgitation is mild.  Mild aortic valve sclerosis is present, with no evidence of aortic valve  stenosis.   6. The inferior vena cava is normal in size with greater than 50%  respiratory variability, suggesting right atrial pressure of 3 mmHg.   7. Evidence of atrial level shunting detected by color flow Doppler.  There is a small atrial septal defect with predominantly left to right  shunting across the atrial septum.     ASSESSMENT AND PLAN:  1.  Chronic systolic heart failure due to nonischemic cardiomyopathy: Ejection fraction is improved to 45 to 50%.  Has NYHA class II symptoms.  Currently on Toprol-XL and Aldactone.  Has not tolerated Entresto in the past.  Plan per heart failure team.    2.  Persistent atrial fibrillation/atrial tachycardia/atrial flutter: Currently on Eliquis with a CHA2DS2-VASc of 3.  Status post ablation 08/14/2019 with repeat ablation 07/15/2020.  Unfortunately she is in atrial flutter today and has been over the last couple weeks.  She was taken off of amiodarone in heart failure clinic.  We Avya Flavell plan to restart amiodarone today.  She has follow-up with heart failure clinic next week.  If she is not back in rhythm, we Darsi Tien likely plan for cardioversion.  This was discussed with primary cardiology.  3.  Severe mitral regurgitation: Status post mitral valve clip 07/23/2020.  Plan per primary cardiology.  Current medicines are reviewed at length with the patient today.   The patient does not have concerns regarding her medicines.  The following changes were made today: Start amiodarone  Labs/ tests ordered today include:  Orders Placed This Encounter  Procedures   EKG 12-Lead      Disposition:   FU with Kitti Mcclish 3 months  Signed, Mahkayla Preece Meredith Leeds, MD  11/03/2020 11:01 AM     [facility] [address] [place] [address] [phone] (office) [phone] (fax)

## 2020-11-03 NOTE — Progress Notes (Cosign Needed)
Chronic Care Management Pharmacy Note  11/04/2020 Name:  Cynthia Frank MRN:  [id number] DOB:  1935/05/27  Summary: -Patient seen by electrophysiology 11/03/2020 - noted to be back in Afib/flutter - discussed possible restart of amiodarone, has appointment with Cardiology 11/09/2020 to discuss wether or not she will restart amiodarone -Patient had most recent ablation 07/15/2020 - was admitted to hospital 07/18/2020 due to fluid overload -  while inpatient had mitral valve clip implanted - noted that after procedure was feeling great, but has recently been feeling short of breath more easily again -Noted to no issue of syncope since metoprolol has been discontinued, BP averaging 110/60-70 with a pulse averaging 80-90 bpm -Patient started on betamethasone lotion by dermatologist Dr. Olegario Shearer which she feels has been helping with the affected areas of skin on her scalp (in conjunction with ketoconazole shampoo) -Scheduled for cataract surgery 11/12/2020  Recommendations/Changes made from today's visit: -Recommending for patient to continue current medications at this time, will follow up after cardiology appointment to determine if amiodarone has been restarted (previously caused increases in LFTs) would recommend close monitoring if restarted -Patient to continue close monitoring of BP, HR, and weight - patient to reach out should she have any issues with syncope / increases in weight (>3 lbs in a day and >5 lbs in week)  Subjective: Cynthia Frank is an 85 y.o. year old female who is a primary patient of [name], MD.  The CCM team was consulted for assistance with disease management and care coordination needs.    Engaged with patient face to face for initial visit in response to provider referral for pharmacy case management and/or care coordination services.   Consent to Services:  The patient was given the following information about Chronic Care Management services  today, agreed to services, and gave verbal consent: 1. CCM service includes personalized support from designated clinical staff supervised by the primary care provider, including individualized plan of care and coordination with other care providers 2. 24/7 contact phone numbers for assistance for urgent and routine care needs. 3. Service will only be billed when office clinical staff spend 20 minutes or more in a month to coordinate care. 4. Only one practitioner may furnish and bill the service in a calendar month. 5.The patient may stop CCM services at any time (effective at the end of the month) by phone call to the office staff. 6. The patient will be responsible for cost sharing (co-pay) of up to 20% of the service fee (after annual deductible is met). Patient agreed to services and consent obtained.  Patient Care Team: [name], MD as PCP - General (Internal Medicine) Constance Haw, MD as PCP - Electrophysiology (Cardiology) Larey Dresser, MD as PCP - Cardiology (Cardiology) Delice Bison Darnelle Maffucci, [facility] as Pharmacist (Pharmacist)  Recent office visits: 08/05/2020 - PCP visit - to office to evaluation for chair lift - prescription written   Recent consult visits: 11/03/2020 - Dr. Curt Bears - Electrophysiology - restart amiodarone as she was in atrial flutter in office  10/14/2020 - Dr. 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 Aundra Dubin - Cardiology - Heart Failure with reduced ejection fraction - no changes to medications  08/27/2020 - Angelena Form PA-C - cardiology - mitral valve clip implantation - doing well after procedure, noted some palpitations - never started digoxin previously prescribed - stop zoloft and restart citalopram  07/27/2020 - Brittainy Simmons PA-C -  Cardiology - post hospital follow up feeling much better, able to walk several laps around her house without significant  dyspnea - no further Afib since 3/30 per device interrogation, plan to restart digoxin (amiodarone stopped in setting of elevated LFTs) 06/23/2020 - Dr. Aundra Dubin - Cardiology - at appointment noted to be back in a flutter - continue metoprolol, increase lasix to 44m BID, decrease amiodarone to 2029mdaily   Hospital visits: 07/18/2020 - ED to hospital admission (Discharged 07/24/2020) - readmitted following ablation procedure due to fluid overload - metoprolol and entresto held due to soft BP, started on lasix 4044maily, continue digoxin 07/15/2020 - Patient to hospital for ablation   Objective:  Lab Results  Component Value Date   CREATININE 0.98 09/08/2020   BUN 11 09/08/2020   GFR 51.70 (L) 03/24/2015   GFRNONAA 58 (L) 09/08/2020   GFRAA 54 (L) 12/12/2019   NA 131 (L) 09/08/2020   K 3.5 09/08/2020   CALCIUM 8.5 (L) 09/08/2020   CO2 25 09/08/2020   GLUCOSE 86 09/08/2020    Lab Results  Component Value Date/Time   GFR 51.70 (L) 03/24/2015 09:35 AM   GFR 61.41 10/22/2014 09:42 AM    Last diabetic Eye exam:  No results found for: HMDIABEYEEXA  Last diabetic Foot exam:  No results found for: HMDIABFOOTEX   Lab Results  Component Value Date   CHOL 150 12/12/2019   HDL 86 12/12/2019   LDLCALC 52 12/12/2019   TRIG 60 12/12/2019   CHOLHDL 1.7 12/12/2019    Hepatic Function Latest Ref Rng & Units 09/08/2020 07/27/2020 07/24/2020  Total Protein 6.5 - 8.1 g/dL 7.2 7.1 5.9(L)  Albumin 3.5 - 5.0 g/dL 3.6 3.3(L) 2.7(L)  AST 15 - 41 U/L 42(H) 68(H) 49(H)  ALT 0 - 44 U/L 29 50(H) 55(H)  Alk Phosphatase 38 - 126 U/L 72 106 85  Total Bilirubin 0.3 - 1.2 mg/dL 1.0 1.9(H) 1.6(H)  Bilirubin, Direct 0.0 - 0.2 mg/dL - - -    Lab Results  Component Value Date/Time   TSH 3.088 07/19/2020 07:00 AM   TSH 4.994 (H) 07/03/2020 10:20 AM   TSH 5.82 (H) 03/24/2015 09:35 AM   TSH 2.41 10/22/2014 09:42 AM   FREET4 0.75 (L) 05/02/2018 09:46 AM   FREET4 0.86 01/30/2018 09:37 AM    CBC Latest Ref Rng  & Units 09/08/2020 07/27/2020 07/24/2020  WBC 4.0 - 10.5 K/uL 5.3 6.5 6.7  Hemoglobin 12.0 - 15.0 g/dL 13.5 12.2 11.4(L)  Hematocrit 36.0 - 46.0 % 41.0 39.3 34.7(L)  Platelets 150 - 400 K/uL 129(L) 155 136(L)    No results found for: VD25OH  Clinical ASCVD: No  The ASCVD Risk score (GoMikey Bussing Jr., et al., 2013) failed to calculate for the following reasons:   The 2013 ASCVD risk score is only valid for ages 40 106 79 75 Depression screen PHQ 2/9 11/04/2020 08/05/2020 02/01/2019  Decreased Interest 0 0 0  Down, Depressed, Hopeless 0 0 0  PHQ - 2 Score 0 0 0  Altered sleeping 0 - -  Tired, decreased energy 0 - -  Change in appetite 0 - -  Feeling bad or failure about yourself  0 - -  Trouble concentrating 0 - -  Moving slowly or fidgety/restless 0 - -  Suicidal thoughts 0 - -  PHQ-9 Score 0 - -  Some recent data might be hidden    Social History   Tobacco Use  Smoking Status Former   Packs/day: 0.50  Years: 20.00   Pack years: 10.00   Types: Cigarettes   Quit date: 04/18/1978   Years since quitting: 42.5  Smokeless Tobacco Never   BP Readings from Last 3 Encounters:  11/03/20 100/60  09/08/20 112/78  08/27/20 92/60   Pulse Readings from Last 3 Encounters:  11/03/20 100  09/08/20 78  08/27/20 76   Wt Readings from Last 3 Encounters:  11/03/20 119 lb 9.6 oz (54.3 kg)  09/08/20 118 lb 9.6 oz (53.8 kg)  08/27/20 117 lb (53.1 kg)   BMI Readings from Last 3 Encounters:  11/03/20 19.90 kg/m  09/08/20 20.04 kg/m  08/27/20 19.77 kg/m    Assessment/Interventions: Review of patient past medical history, allergies, medications, health status, including review of consultants reports, laboratory and other test data, was performed as part of comprehensive evaluation and provision of chronic care management services.   SDOH:  (Social Determinants of Health) assessments and interventions performed: Yes  SDOH Screenings   Alcohol Screen: Not on file  Depression (PHQ2-9): Low  Risk    PHQ-2 Score: 0  Financial Resource Strain: Low Risk    Difficulty of Paying Living Expenses: Not hard at all  Food Insecurity: No Food Insecurity   Worried About Charity fundraiser in the Last Year: Never true   Ran Out of Food in the Last Year: Never true  Housing: Low Risk    Last Housing Risk Score: 0  Physical Activity: Not on file  Social Connections: Not on file  Stress: Not on file  Tobacco Use: Medium Risk   Smoking Tobacco Use: Former   Smokeless Tobacco Use: Never  Transportation Needs: No Transportation Needs   Lack of Transportation (Medical): No   Lack of Transportation (Non-Medical): No    CCM Care Plan  Allergies  Allergen Reactions   Clindamycin/Lincomycin Itching   Dramamine Ii [Meclizine Hcl] Other (See Comments)    hyper   Pneumococcal Vaccines Itching and Swelling    Redness    Sulfonamide Derivatives Itching and Swelling    Medications Reviewed Today     Reviewed by Tomasa Blase, [facility] (Pharmacist) on 11/04/20 at 1142  Med List Status: <None>   Medication Order Taking? Sig Documenting Provider Last Dose Status Informant  acetaminophen (TYLENOL) 500 MG tablet [id number] Yes Take 500 mg by mouth every 6 (six) hours as needed for moderate pain or headache. [provider] Taking Active Self  amoxicillin (AMOXIL) 500 MG tablet [id number] Yes Take 4 tablets (2,000 mg total) by mouth once as needed for up to 1 dose (take 1 hour before dental procedure). [place], [place], FNP Taking Active   atorvastatin (LIPITOR) 40 MG tablet [id number] Yes TAKE 1 TABLET(40 MG) BY MOUTH DAILY AT 6 PM Camnitz, Will Hassell Done, MD Taking Active   betamethasone valerate lotion (VALISONE) 0.1 % [id number] Yes Apply 1 application topically 2 (two) times daily. [provider] Taking Active   citalopram (CELEXA) 20 MG tablet [id number] Yes Take 20 mg by mouth daily. [provider] Taking Active   Coenzyme Q10 (COQ10) 200 MG CAPS [id number] Yes Take 200  mg by mouth daily. [provider] Taking Active Self           Med Note Isidor Holts   Tue Apr 07, 2015  3:12 PM)    conjugated estrogens (PREMARIN) vaginal cream [id number] Yes Place 1 Applicatorful vaginally daily as needed (dyspareunia (Use as directed)).  [provider] Taking Active Self  diclofenac Sodium (VOLTAREN) 1 % GEL  [id number] Yes Apply 2 g topically daily. [provider] Taking Active Self  ELIQUIS 2.5 MG TABS tablet [id number] Yes TAKE 1 TABLET(2.5 MG) BY MOUTH TWICE DAILY Camnitz, Will Hassell Done, MD Taking Active   FARXIGA 10 MG TABS tablet [id number] Yes TAKE 1 TABLET(10 MG) BY MOUTH DAILY BEFORE BREAKFAST Larey Dresser, MD Taking Active   furosemide (LASIX) 40 MG tablet [id number] Yes Take 1 tablet (40 mg total) by mouth daily. Darrick Grinder D, NP Taking Active   ipratropium (ATROVENT) 0.03 % nasal spray [id number] Yes Place 1 spray into both nostrils 2 (two) times daily. [provider] Taking Active Self  ketoconazole (NIZORAL) 2 % shampoo [id number] Yes Apply 1 application topically 3 (three) times a week. [provider] Taking Active Self  levothyroxine (SYNTHROID) 25 MCG tablet [id number] Yes TAKE 1 TABLET(25 MCG) BY MOUTH DAILY BEFORE BREAKFAST Larey Dresser, MD Taking Active   Magnesium Oxide 500 MG CAPS [id number] Yes Take 1,000 mg by mouth 2 (two) times daily.  [provider] Taking Active Self  mupirocin ointment (BACTROBAN) 2 % [id number] Yes Place 1 application into the nose 2 (two) times daily. Darrick Grinder D, NP Taking Active   ofloxacin (OCUFLOX) 0.3 % ophthalmic solution [id number] Yes Place 1 drop into the right eye 3 (three) times daily. [provider] Taking Active   potassium chloride SA (KLOR-CON) 20 MEQ tablet [id number] Yes Take 0.5 tablets (10 mEq total) by mouth daily. Lyda Jester M, PA-C Taking Active   spironolactone (ALDACTONE) 25 MG tablet [id number] Yes TAKE 1 TABLET(25 MG) BY MOUTH AT  BEDTIME Larey Dresser, MD Taking Active   zolpidem (AMBIEN) 5 MG tablet [id number] Yes Take 1 tablet (5 mg total) by mouth at bedtime as needed for sleep. [name], MD Taking Active             Patient Active Problem List   Diagnosis Date Noted   S/P mitral valve clip implantation 07/23/2020   Heart failure with reduced ejection fraction ([place]) 07/18/2020   Hyponatremia 07/18/2020   Anemia 07/18/2020   Elevated LFTs 07/18/2020   Severe mitral regurgitation    Vertigo 03/27/2019   Orthostatic hypotension    Syncope and collapse    Closed right hip fracture ([place]) 12/16/2018   Hypokalemia 12/16/2018   Dysphoric mood 08/11/2017   Insomnia 04/21/2016   Arthritis 29/57/4734   Chronic systolic CHF (congestive heart failure) ([place]) 06/03/2015   GERD (gastroesophageal reflux disease) 04/21/2015   Personal history of colon cancer    Routine general medical examination at a health care facility 03/24/2015   Nonischemic cardiomyopathy ([place])    Coronary artery disease    Hyperlipidemia    Atrial fibrillation ([place]) 12/11/2012   Cough 05/16/2012   THYROID NODULE 04/27/2007   Essential hypertension 04/27/2007    Immunization History  Administered Date(s) Administered   Influenza Split 12/17/2013   Influenza, High Dose Seasonal PF 01/20/2016, 01/19/2017, 12/28/2017, 01/01/2019   Influenza-Unspecified 12/18/2011, 12/27/2012, 01/17/2015, 01/19/2017, 01/01/2019   PFIZER(Purple Top)SARS-COV-2 Vaccination 05/14/2019, 06/06/2019   Pneumococcal Conjugate-13 03/20/2013   Pneumococcal Polysaccharide-23 03/09/2015   Tdap 11/14/2017   Zoster, Live 04/18/2002    Conditions to be addressed/monitored:  Hyperlipidemia, Atrial Fibrillation, Heart Failure, Hypothyroidism, Depression, Osteoporosis, and Insomnia  Care Plan : CCM Care Plan  Updates made by Tomasa Blase, RPH since 11/04/2020 12:00 AM     Problem: HLD, CAD, HF, Afib, Osteoporosis, Hypothyroidism, Depression,  Insmonia   Priority: High  Onset Date: 11/04/2020  Long-Range Goal: Disease Management   Start Date: 11/04/2020  Expected End Date: 05/07/2021  This Visit's Progress: On track  Priority: High  Note:   Current Barriers:  Unable to independently monitor therapeutic efficacy Unable to maintain control of Afib  Pharmacist Clinical Goal(s):  Patient will verbalize ability to afford treatment regimen achieve adherence to monitoring guidelines and medication adherence to achieve therapeutic efficacy achieve control of Afib as evidenced by EKG with next cardiology appointment maintain control of LDL and HF as evidenced by next lipid panel and weight gain/ activity tolerance  through collaboration with PharmD and provider.   Interventions: 1:1 collaboration with [name], MD regarding development and update of comprehensive plan of care as evidenced by provider attestation and co-signature Inter-disciplinary care team collaboration (see longitudinal plan of care) Comprehensive medication review performed; medication list updated in electronic medical record  Hyperlipidemia/ CAD: (LDL goal < 70) -Controlled Last LDL Level - 52 mg/dL (12/12/2019) -Current treatment: Atorvastatin 62m - 1 tablet daily  -Medications previously tried: n/a  -Current dietary patterns: moderates fried / fatty food intake  -Current exercise habits: walking daily as she is able to  -Educated on Cholesterol goals;  Benefits of statin for ASCVD risk reduction; Importance of limiting foods high in cholesterol; Strategies to manage statin-induced myalgias; -Counseled on diet and exercise extensively Recommended to continue current medication  Atrial Fibrillation (Goal: prevent stroke and major bleeding) -Not ideally controlled -CHADSVASC: 3 -Current treatment: Rate control: n/a at this time  Anticoagulation: Eliquis 2.577m- 1 tablet twice daily (age >8073ears old and Weight <60 kg)  -Medications  previously tried: amiodarone, metoprolol -Counseled on increased risk of stroke due to Afib and benefits of anticoagulation for stroke prevention; importance of adherence to anticoagulant exactly as prescribed; bleeding risk associated with Eliquis and importance of self-monitoring for signs/symptoms of bleeding; avoidance of NSAIDs due to increased bleeding risk with anticoagulants; importance of regular laboratory monitoring; seeking medical attention after a head injury or if there is blood in the urine/stool; -Recommended to continue current medication -Patient has follow up with cardiology 11/09/2020 - will be discussing possible restart of amiodarone   Heart Failure (Goal: manage symptoms and prevent exacerbations) -Controlled -Last ejection fraction: 20-25% (Date: 08/27/2020) -HF type: Systolic -NYHA Class: II (slight limitation of activity)-Current treatment: Farxiga 1039m 1 tablet daily  Furosemide 12m50m1 tablet daily Spironolactone 25mg51m tablet daily  Potassium Chloride 20mEq14m/2 tablet daily  - last potassium level 3.5 mmol/L (09/08/2020)  -Medications previously tried: Amiodarone, Entresto (stopped due to syncope), Metoprolol Succinate (stopped due to excessive sedation), metoprolol tartrate, ramipril, olmesartan, losartan   -Current home BP/HR readings: averaging ~100/64 pulse of ~84-90 -Current dietary habits: reports that she eats a sodium reduced diet, monitors fluid intake -Current exercise habits: reports to daily activity such as walking as she is able to tolerate  -Educated on Benefits of medications for managing symptoms and prolonging life Importance of weighing daily; if you gain more than 3 pounds in one day or 5 pounds in one week, make office/ cardiologist aware Proper diuretic administration and potassium supplementation Importance of blood pressure control -Counseled on diet and exercise extensively Recommended to continue current  medication  Depression/ Insomnia (Goal: mood control / promotion of quality sleep) -Controlled -Current treatment: Citalopram 20mg -26mablet daily  Zolpidem 5mg - 1108mblet nightly if needed - reports to taking twice weekly most of the time will use 1/2 tablet at a time  -Medications  previously tried/failed: buspirone, sertraline -PHQ9: 0 -Educated on Benefits of medication for symptom control Benefits of cognitive-behavioral therapy with or without medication -Recommended to continue current medication  Hypothyroidism (Goal: Maintenance of euthyroid levels) -Controlled -Last TSH Level 3.088uIU/mL 07/19/2020) -Current treatment  Levothyroxine 41mg - 1 tablet daily  -Medications previously tried: n/a  -Recommended to continue current medication  Osteoporosis (Goal Prevention of fractures / disease progression) -Controlled - will be due for repeat DEXA 01/2021 -Last DEXA Scan: 02/06/2019   T-Score left femoral neck: -3.0  T-Score lumbar spine: -1.9 -Patient is a candidate for pharmacologic treatment due to T-Score < -2.5 in femoral neck -Current treatment  Prolia - 624mevery 6 months  -Medications previously tried: n/a  -Recommend weight-bearing and muscle strengthening exercises for building and maintaining bone density. -Recommended to continue current medication  Cataracts (Goal: Treatment of symptoms) -Not ideally controlled -Current treatment  Ofloxacin 0.3% - 1 drop into the right eye 3 times daily   -Medications previously tried: n/a  -Recommended to continue current medication - Patient is scheduled for cataract surgery 11/12/2020 - taking antibiotic eye drops in preparation for procedure  Dermatitis (Goal: Treatment of affected skin areas) - [facility]  -Controlled -Current treatment  Ketoconazole 2% shampoo - used 3 times weekly Clobetasol 0.05% external solution - applied twice daily  -Medications previously tried:  mometasone   -Recommended to continue current medication   Health Maintenance -Vaccine gaps: Shingles and COVID booster  -Current therapy:  Acetaminophen 50035m 1 tablet every 6 hours as needed  Diclofenac 1% gel - apply 2g topically up to 4 times daily  CoQ10 200m80m1 capsule daily  Magnesium Oxide 500mg12m tablets daily  Premarin Vaginal cream - 1 applicator daly as needed  - typically using twice weekly  Ipratropium 0.03% nasal spray - 1 spray into each nostril twice daily  Mupirocin 2% ointment - applied twice daily  -Educated on Cost vs benefit of each product must be carefully weighed by individual consumer -Patient is satisfied with current therapy and denies issues -Recommended to continue current medication  Patient Goals/Self-Care Activities Patient will:  - take medications as prescribed check blood pressure twice daily, document, and provide at future appointments weigh daily, and contact provider if weight gain of 3 lbs in a day for 5 lbs in a week engage in dietary modifications by moderating fried and fatty food intake   Follow Up Plan: Telephone follow up appointment with care management team member scheduled for: The patient has been provided with contact information for the care management team and has been advised to call with any health related questions or concerns.     Medication Assistance: None required.  Patient affirms current coverage meets needs.  [id number]: [phone] pill box? Yes Pt endorses 100% compliance  Care Plan and Follow Up Patient Decision:  Patient agrees to Care Plan and Follow-up.  Plan: Telephone follow up appointment with care management team member scheduled for:  8 weeks and The patient has been provided with contact  information for the care management team and has been advised to call with any health related questions or concerns.   DanieTomasa BlasermD Clinical Pharmacist, [place]

## 2020-11-03 NOTE — Patient Instructions (Signed)
Medication Instructions:  Your physician recommends that you continue on your current medications as directed. Please refer to the Current Medication list given to you today.  *If you need a refill on your cardiac medications before your next appointment, please call your pharmacy*   Lab Work: None ordered   Testing/Procedures: None ordered   Follow-Up: At [facility], you and your health needs are our priority.  As part of our continuing mission to provide you with exceptional heart care, we have created designated Provider Care Teams.  These Care Teams include your primary Cardiologist (physician) and Advanced Practice Providers (APPs -  Physician Assistants and Nurse Practitioners) who all work together to provide you with the care you need, when you need it.  Your next appointment:   3 month(s)  The format for your next appointment:   In Person  Provider:   Will Camnitz, MD    Thank you for choosing [facility]!!   Marvis Saefong, RN [phone]     

## 2020-11-04 ENCOUNTER — Ambulatory Visit (INDEPENDENT_AMBULATORY_CARE_PROVIDER_SITE_OTHER): Payer: Medicare Other

## 2020-11-04 DIAGNOSIS — E7849 Other hyperlipidemia: Secondary | ICD-10-CM | POA: Diagnosis not present

## 2020-11-04 DIAGNOSIS — I5022 Chronic systolic (congestive) heart failure: Secondary | ICD-10-CM | POA: Diagnosis not present

## 2020-11-04 DIAGNOSIS — I2510 Atherosclerotic heart disease of native coronary artery without angina pectoris: Secondary | ICD-10-CM | POA: Diagnosis not present

## 2020-11-04 DIAGNOSIS — M810 Age-related osteoporosis without current pathological fracture: Secondary | ICD-10-CM | POA: Diagnosis not present

## 2020-11-04 DIAGNOSIS — I2583 Coronary atherosclerosis due to lipid rich plaque: Secondary | ICD-10-CM | POA: Diagnosis not present

## 2020-11-04 DIAGNOSIS — I480 Paroxysmal atrial fibrillation: Secondary | ICD-10-CM

## 2020-11-04 DIAGNOSIS — G4700 Insomnia, unspecified: Secondary | ICD-10-CM

## 2020-11-08 ENCOUNTER — Other Ambulatory Visit: Payer: Self-pay | Admitting: Cardiology

## 2020-11-08 DIAGNOSIS — I4819 Other persistent atrial fibrillation: Secondary | ICD-10-CM

## 2020-11-09 ENCOUNTER — Other Ambulatory Visit (HOSPITAL_COMMUNITY): Payer: Self-pay

## 2020-11-09 ENCOUNTER — Encounter (HOSPITAL_COMMUNITY): Payer: Self-pay | Admitting: Cardiology

## 2020-11-09 ENCOUNTER — Other Ambulatory Visit: Payer: Self-pay

## 2020-11-09 ENCOUNTER — Ambulatory Visit (HOSPITAL_COMMUNITY)
Admission: RE | Admit: 2020-11-09 | Discharge: 2020-11-09 | Disposition: A | Discharge status: Home / Self Care | Payer: Medicare Other | Source: Ambulatory Visit | Attending: Cardiology | Admitting: Cardiology

## 2020-11-09 VITALS — BP 90/50 | HR 104 | Wt 118.8 lb

## 2020-11-09 DIAGNOSIS — I2510 Atherosclerotic heart disease of native coronary artery without angina pectoris: Secondary | ICD-10-CM | POA: Insufficient documentation

## 2020-11-09 DIAGNOSIS — Z882 Allergy status to sulfonamides status: Secondary | ICD-10-CM | POA: Diagnosis not present

## 2020-11-09 DIAGNOSIS — I499 Cardiac arrhythmia, unspecified: Secondary | ICD-10-CM | POA: Diagnosis not present

## 2020-11-09 DIAGNOSIS — R55 Syncope and collapse: Secondary | ICD-10-CM | POA: Diagnosis not present

## 2020-11-09 DIAGNOSIS — Z881 Allergy status to other antibiotic agents status: Secondary | ICD-10-CM | POA: Insufficient documentation

## 2020-11-09 DIAGNOSIS — I484 Atypical atrial flutter: Secondary | ICD-10-CM

## 2020-11-09 DIAGNOSIS — I5022 Chronic systolic (congestive) heart failure: Secondary | ICD-10-CM | POA: Insufficient documentation

## 2020-11-09 DIAGNOSIS — R42 Dizziness and giddiness: Secondary | ICD-10-CM | POA: Diagnosis not present

## 2020-11-09 DIAGNOSIS — Z7901 Long term (current) use of anticoagulants: Secondary | ICD-10-CM | POA: Diagnosis not present

## 2020-11-09 DIAGNOSIS — R7989 Other specified abnormal findings of blood chemistry: Secondary | ICD-10-CM | POA: Diagnosis not present

## 2020-11-09 DIAGNOSIS — I428 Other cardiomyopathies: Secondary | ICD-10-CM | POA: Diagnosis not present

## 2020-11-09 DIAGNOSIS — Z7984 Long term (current) use of oral hypoglycemic drugs: Secondary | ICD-10-CM | POA: Insufficient documentation

## 2020-11-09 DIAGNOSIS — Z87891 Personal history of nicotine dependence: Secondary | ICD-10-CM | POA: Diagnosis not present

## 2020-11-09 DIAGNOSIS — Z8249 Family history of ischemic heart disease and other diseases of the circulatory system: Secondary | ICD-10-CM | POA: Diagnosis not present

## 2020-11-09 DIAGNOSIS — E039 Hypothyroidism, unspecified: Secondary | ICD-10-CM | POA: Diagnosis not present

## 2020-11-09 DIAGNOSIS — I4892 Unspecified atrial flutter: Secondary | ICD-10-CM | POA: Insufficient documentation

## 2020-11-09 DIAGNOSIS — Z8616 Personal history of COVID-19: Secondary | ICD-10-CM | POA: Diagnosis not present

## 2020-11-09 DIAGNOSIS — I110 Hypertensive heart disease with heart failure: Secondary | ICD-10-CM | POA: Insufficient documentation

## 2020-11-09 DIAGNOSIS — Z79899 Other long term (current) drug therapy: Secondary | ICD-10-CM | POA: Diagnosis not present

## 2020-11-09 DIAGNOSIS — I5020 Unspecified systolic (congestive) heart failure: Secondary | ICD-10-CM

## 2020-11-09 LAB — CBC
HCT: 41.3 % (ref 36.0–46.0)
Hemoglobin: 13.7 g/dL (ref 12.0–15.0)
MCH: 31.8 pg (ref 26.0–34.0)
MCHC: 33.2 g/dL (ref 30.0–36.0)
MCV: 95.8 fL (ref 80.0–100.0)
Platelets: 131 10*3/uL — ABNORMAL LOW (ref 150–400)
RBC: 4.31 MIL/uL (ref 3.87–5.11)
RDW: 14.2 % (ref 11.5–15.5)
WBC: 5.2 10*3/uL (ref 4.0–10.5)
nRBC: 0 % (ref 0.0–0.2)

## 2020-11-09 LAB — COMPREHENSIVE METABOLIC PANEL
ALT: 24 U/L (ref 0–44)
AST: 29 U/L (ref 15–41)
Albumin: 3.8 g/dL (ref 3.5–5.0)
Alkaline Phosphatase: 48 U/L (ref 38–126)
Anion gap: 12 (ref 5–15)
BUN: 15 mg/dL (ref 8–23)
CO2: 25 mmol/L (ref 22–32)
Calcium: 9.3 mg/dL (ref 8.9–10.3)
Chloride: 97 mmol/L — ABNORMAL LOW (ref 98–111)
Creatinine, Ser: 1.02 mg/dL — ABNORMAL HIGH (ref 0.44–1.00)
GFR, Estimated: 55 mL/min — ABNORMAL LOW (ref >60)
Glucose, Bld: 103 mg/dL — ABNORMAL HIGH (ref 70–99)
Potassium: 3.3 mmol/L — ABNORMAL LOW (ref 3.5–5.1)
Sodium: 134 mmol/L — ABNORMAL LOW (ref 135–145)
Total Bilirubin: 1.6 mg/dL — ABNORMAL HIGH (ref 0.3–1.2)
Total Protein: 7.4 g/dL (ref 6.5–8.1)

## 2020-11-09 LAB — TSH: TSH: 6.87 u[IU]/mL — ABNORMAL HIGH (ref 0.350–4.500)

## 2020-11-09 NOTE — Progress Notes (Signed)
Date:  11/09/2020   ID:  Cynthia Frank, DOB 1935-06-21, MRN [id number]  Provider location: [facility] Type of Visit: Established patient   PCP:  [name], MD  Cardiologist:  Dr. Aundra Dubin   History of Present Illness: Cynthia Frank is a 85 y.o. female who has a history of chronic systolic CHF/nonischemic cardiomyopathy, paroxysmal atrial fibrillation, and prior ovarian and colon cancers. She has had a cardiomyopathy known for > 15 years now. Cardiac cath at diagnosis showed mild nonobstructive disease.  EF has been persistently low.  Cause has been thought to be Adriamycin; she received this as part of her ovarian cancer treatment. She has had paroxysmal atrial fibrillation and has remained in NSR with amiodarone use.  No recent atrial fibrillation symptoms, typically feels prolonged palpitations thought to be atrial fibrillation 2-3 times/year.    She had had dyspnea with moderate to heavy exertion for 4-5 years prior to initial appt.  However, just before her initial appt, her symptoms had worsened.  She developed shortness of breath walking short distances on flat ground.  Lasix was increased to 40 mg bid and she lost weight and felt better.      Cardiac MRI was done in 1/17, showing EF 29% with normal RV.  Septal-lateral dyssynchrony was present and there was a non-coronary pattern of LGE in the septum.  LFTs were noted to be mildly elevated.  Amiodarone was decreased to 100 mg daily.    She had Medtronic CRT-D placed in 3/17. Repeat echo 8/17 showed EF 35% with diffuse hypokinesis, normal RV.  Echo in 4/19 showed EF 30-35%, mild AI.    On 11/12/17, she stood up, got lightheaded and passed out briefly with a fall.  Her ICD did not discharge.  She went to the ER and was found to have right C5 transverse process fracture.  She says that she has had lightheadedness with standing for years, comes and goes.  I decreased her Toprol XL.   In 8/20, she  had an episode of lightheadedness with standing followed by syncope and fall with right hip fracture.  She was admitted, had repair of right hip.  She was noted to be orthostatic and Entresto was stopped.  Echo in 8/20 showed EF 45-50% with normal RV systolic function.  In 1/21, patient was in atrial tachycardia and had been in it for several weeks.  She was volume overloaded.  Lasix was increased and she was brought into the hospital for DCCV, but she had converted to NSR on her own.    In 4/21, she had atrial fibrillation ablation.  TEE done in 4/21 showed EF 30-35% with global HK.  She is now off amiodarone.   Echo in 11/21 showed EF 25-30% with mild LV dilation, global hypokinesis with septal-lateral dyssynchrony, moderate MR, moderately decreased RV systolic function with normal RV size, IVC normal.   She went into atrial flutter in 11/21 and had DCCV back to NSR in 1/22.  She stayed in NSR for a few weeks but went back in atrial flutter again.  She had COVID-19 in 2/22.  Redo AF ablation in 3/22.   TEE in 3/22 showed EF 20-25%, moderate RV dysfunction, moderate TR, severe MR.  In 4/22, she had Mitraclip placement.  Repeat echo in 5/22 showed EF 20-25%, moderate RV dysfunction, s/p Mitraclip with mean gradient 2 with trivial MR.     She returns today for followup of CHF.  She has  been back in atrial flutter for several weeks, ECG today shows atrial flutter.  BiV pacing percentage has decreased.  She is short of breath walking a long distance. No problems walking up stairs.  No orthopnea/PND.  No chest pain.  No lightheadedness.  Weight is stable.   Medtronic device interrogation: Persistent atrial flutter, 49% BiV pacing.   ECG (personally reviewed): Atrial flutter, BiV pacing   Labs (1/17): K 4.2, creatinine 1.14, BNP 855 Labs (3/17): SPEP negative, BNP 341, K 4.5, creatinine 1.27, AST 62, ALT 74, TSH normal with low free T3 and normal free T4, HCT 46.5 Labs (4/17): AST 64, ALT 67, BNP 278,  K 3.6, creatinine 1.21 Labs (5/17): K 3.4, creatinine 1.39, AST 53, ALT 61, HBV/HCV negative, TSH mildly elevated, BNP 306 Labs (6/17): K 4.4, creatinine 1.36, free T4 normal, free T3 mildly decreased, AST 50, ALT 53. Labs (7/17): ANA negative, ASMA positive Labs (8/17): AST 45, ALT 37, K 3.8, creatinine 1.19, TSH normal Labs (2/18): TSH normal, LDL 47, LFTs, normal Labs (3/18): K normal, creatinine 1.23.  Labs (7/18): K 4, creatinine 1.32, LFTs normal Labs (7/19): K 3.8, creatinine 1.25, hgb 11.6 Labs (8/19): TSH mildly elevated, K 4.2, creatinine 1.22 Labs (10/19): K 4.1, creatinine 1.23, LFTs normal, TSH mildly elevated with normal free T3 and free T4.  Labs (1/20): K 3.5, creatinine 1.15, TSH elevated, free T3 and free T4 low, AST 63, ALT 51 Labs (5/20): K 3.8, creatinine 1.22, TSH normal, AST 49, ALT 43, tbili 0.9 Labs (11/20): K 4, creatinine 1.15  Labs (12/20): K 4.1, creatinine 1.25, TSH normal, AST 51, ALT 50 Labs (1/21): AST 145, ALT 107, tbili 1.8, TSH normal Labs (2/21): K 3.8, creatinine 1.38 Labs (3/21): AST 50, ALT 46 Labs (4/21): K 3.9, creatinine 0.96 Labs (5/21): K 4.5, creatinine 1.11, hgb 13.1, plts 141, AST 65, ALT 53 Labs (8/21): LDL 52, K 3.8, creatinine 1.11, AST 67, ALT 52 Labs (11/21): LFTs normal Labs (12/21): K 3.5, creatinine 1.33, LFTs normal Labs (3/22): K 4.6, creatinine 1.09, hgb 12.2 Labs (4/22): K 5.1, creatinine 1.19 Labs (5/22): K 3.5, creatinine 0.98, BNP 106.7, hgb 13.5, AST 42, ALT 29   PMH: 1. Chronic systolic CHF: Nonischemic cardiomyopathy, thought to be related to adriamycin that she had for treatment of ovarian cancer.  NICM diagnosed ~ 2001.  - LHC ~ 2001 with 30% stenosis D1.   - Echo (2013) EF 25-30%.  - Echo (10/14) with EF 25-30%. - Echo (12/16) with EF 20-25%, mild LV dilation, restrictive diastolic function, moderate MR, severe LAE. - Cardiac MRI (1/17): Moderately dilated LV with EF 29%. Diffuse hypokinesis looking worse in the  septal wall. Septal-lateral dyssynchrony. Normal RV size and systolic function.  Moderate MR with tethered posterior leaflet.  Mid-wall LGE in the basal to mid septum. This is not a coronary disease pattern. Could be suggestive of prior myocarditis. - Medtronic CRT-D placed 3/17.  - Echo (8/17): EF 35%, diffuse hypokinesis, normal RV size and systolic function, moderate MR.   - Echo (4/19): EF 30-35%, mild AI.  - Echo (8/20): EF 45-50%, diffuse hypokinesis, normal RV size and systolic function. - TEE (4/21): EF 30%, global hypokinesis.  - Echo (11/21): EF 25-30% with mild LV dilation, global hypokinesis with septal-lateral dyssynchrony, moderate MR, moderately decreased RV systolic function with normal RV size, IVC normal.  - Echo (5/22): EF 20-25%, moderate RV dysfunction, s/p Mitraclip with mean gradient 2 with trivial MR. 2. Atrial fibrillation/atrial tachycardia: Paroxysmal.  -  4/21 atrial fibrillation ablation. - 3/22 redo AF ablation  3. HTN 4. Ovarian cancer: 1985, s/p surgery. Received Adriamycin-based chemotherapy.  5. Colon cancer: 2007, s/p surgery.  6. PVCs 7. GERD 8. PFTs (8/16) without significant abnormality. 9. Prior syncope 10. Elevated transaminases: Uncertain etiology.  - Abdominal US (12/20): Normal-appearing liver.  11. COVID-19 infection: 2/22.  12. Atrial flutter: DCCV in 1/22.  13. Mitral regurgitation: Primarily functional.  S/p Mitraclip in 4/22.   Current Outpatient Medications  Medication Sig Dispense Refill   acetaminophen (TYLENOL) 500 MG tablet Take 500 mg by mouth every 6 (six) hours as needed for moderate pain or headache.     amoxicillin (AMOXIL) 500 MG tablet Take 4 tablets (2,000 mg total) by mouth once as needed for up to 1 dose (take 1 hour before dental procedure). 4 tablet 3   apixaban (ELIQUIS) 2.5 MG TABS tablet TAKE 1 TABLET(2.5 MG) BY MOUTH TWICE DAILY 180 tablet 1   atorvastatin (LIPITOR) 40 MG tablet TAKE 1 TABLET(40 MG) BY MOUTH DAILY AT 6  PM 90 tablet 3   betamethasone valerate lotion (VALISONE) 0.1 % Apply 1 application topically 2 (two) times daily.     citalopram (CELEXA) 20 MG tablet Take 20 mg by mouth daily.     Coenzyme Q10 (COQ10) 200 MG CAPS Take 200 mg by mouth daily.     conjugated estrogens (PREMARIN) vaginal cream Place 1 Applicatorful vaginally daily as needed (dyspareunia (Use as directed)).      diclofenac Sodium (VOLTAREN) 1 % GEL Apply 2 g topically daily.     FARXIGA 10 MG TABS tablet TAKE 1 TABLET(10 MG) BY MOUTH DAILY BEFORE BREAKFAST 30 tablet 6   furosemide (LASIX) 40 MG tablet Take 1 tablet (40 mg total) by mouth daily. 30 tablet 5   ipratropium (ATROVENT) 0.03 % nasal spray Place 1 spray into both nostrils 2 (two) times daily.     ketoconazole (NIZORAL) 2 % shampoo Apply 1 application topically 3 (three) times a week.     levothyroxine (SYNTHROID) 25 MCG tablet TAKE 1 TABLET(25 MCG) BY MOUTH DAILY BEFORE BREAKFAST 30 tablet 6   Magnesium Oxide 500 MG CAPS Take 1,000 mg by mouth 2 (two) times daily.      mupirocin ointment (BACTROBAN) 2 % Place 1 application into the nose 2 (two) times daily. 1 g 0   ofloxacin (OCUFLOX) 0.3 % ophthalmic solution Place 1 drop into the right eye 3 (three) times daily.     potassium chloride SA (KLOR-CON) 20 MEQ tablet Take 0.5 tablets (10 mEq total) by mouth daily. 90 tablet 3   spironolactone (ALDACTONE) 25 MG tablet TAKE 1 TABLET(25 MG) BY MOUTH AT BEDTIME 90 tablet 3   zolpidem (AMBIEN) 5 MG tablet Take 1 tablet (5 mg total) by mouth at bedtime as needed for sleep. 30 tablet 3   No current facility-administered medications for this encounter.    Allergies:   Clindamycin/lincomycin, Dramamine ii [meclizine hcl], Pneumococcal vaccines, and Sulfonamide derivatives   Social History:  The patient  reports that she quit smoking about 42 years ago. Her smoking use included cigarettes. She has a 10.00 pack-year smoking history. She has never used smokeless tobacco. She reports  current alcohol use of about 14.0 standard drinks of alcohol per week. She reports that she does not use drugs.   Family History:  The patient's family history includes Heart attack in her father; Heart disease in her father; Prostate cancer in her father; Uterine cancer in her mother.  ROS:  Please see the history of present illness.   All other systems are personally reviewed and negative.   Exam:  BP (!) 90/50   Pulse (!) 104   Wt 53.9 kg (118 lb 12.8 oz)   SpO2 97%   BMI 19.77 kg/m  General: NAD Neck: No JVD, no thyromegaly or thyroid nodule.  Lungs: Clear to auscultation bilaterally with normal respiratory effort. CV: Nondisplaced PMI.  Heart irregular S1/S2, no S3/S4, no murmur.  No peripheral edema.  No carotid bruit.  Normal pedal pulses.  Abdomen: Soft, nontender, no hepatosplenomegaly, no distention.  Skin: Intact without lesions or rashes.  Neurologic: Alert and oriented x 3.  Psych: Normal affect. Extremities: No clubbing or cyanosis.  HEENT: Normal.   Recent Labs: 07/18/2020: Magnesium 2.2 09/08/2020: B Natriuretic Peptide 1,067.3 11/09/2020: ALT 24; BUN 15; Creatinine, Ser 1.02; Hemoglobin 13.7; Platelets 131; Potassium 3.3; Sodium 134; TSH 6.870  Personally reviewed   Wt Readings from Last 3 Encounters:  11/09/20 53.9 kg (118 lb 12.8 oz)  11/03/20 54.3 kg (119 lb 9.6 oz)  09/08/20 53.8 kg (118 lb 9.6 oz)    ASSESSMENT AND PLAN:  1. Chronic systolic CHF: EF 0000000 with diffuse hypokinesis on 12/16 echo.  Nonischemic cardiomyopathy, probably related to Adriamycin with ovarian cancer treatment. Cardiac MRI in 1/17 showed EF 29% with normal RV and septal-lateral dyssynchrony.  There was a non-cardiac pattern of LGE in the septum, cannot rule out prior myocarditis based on this pattern.  She has a Medtronic CRT-D device. Echo 4/19 with EF 30-35%, then echo in 8/20 showed EF up to 45-50%.  TEE in 3/21 showed EF 30-35%.  Echo in 11/21 showed EF 25-30% with mild LV dilation,  global hypokinesis with septal-lateral dyssynchrony, moderate MR, moderately decreased RV systolic function with normal RV size, IVC normal.  In 4/22, she had Mitraclip placement. In 5/22, echo showed EF 20-25%, moderate RV dysfunction, s/p Mitraclip with mean gradient 2 with trivial MR. She is back in atrial flutter today with decreased BiV pacing and mildly worsened symptoms, NYHA class II-III.  She is not volume overloaded.  - She is off Toprol XL, she feels like it makes her excessively fatigued.  - Continue spironolactone 25 mg daily.  - She will stay off Entresto given syncope and lightheadedness while taking it.  Will not start losartan with soft BP.  - Continue Lasix 40 mg daily, BMET today.  - Continue Farxiga 10 mg daily.  - She will need to get back into NSR, see below.  2. Atrial arrhythmias: She needs to maintain NSR as she loses BiV pacing when she goes into atrial arrhythmias, triggering CHF exacerbation.  She had atrial fibrillation ablation in 4/21.  She went into atrial flutter in 11/21, she had DCCV in 12/21.  She is off amiodarone with elevated LFTs.  She had redo AF ablation in 3/22.  She has been in atrial flutter again for about 3 wks with loss of BiV pacing.  - Continue apixaban, no missed doses.  - I will arrange for DCCV.  We discussed risks/benefits and she agrees to procedure.  3. Hypothyroidism: Suspect related to prior amiodarone use.   - Continue Levoxyl.  4. Elevated LFTs: Mild elevation in past, resolved in 11/21.  Abdominal US showed unremarkable liver.  ?Related to amiodarone.  She is now back off amiodarone.  She has seen GI. Most recent LFTs were near normal.  - Check LFTs with labs today.   5. CAD: Calcification noted  in coronaries with pulmonary vein CT done prior to afib ablation.  - Continue atorvastatin 40 daily, good lipids in 8/21.  - No ASA given apixaban use.   Followup in 1 month with APP.   Signed, [name], MD  11/09/2020  [facility] [address] [facility] [place] [address] [phone] (office) [phone] (fax)

## 2020-11-09 NOTE — Telephone Encounter (Signed)
Pt last saw Dr Curt Bears 11/03/20, last labs 09/08/20 Creat 0.98, Age 85, weight 54.3kg, based on specified criteria pt is on appropriate dosage of Eliquis 2.'5mg'$  BID.  Will refill rx.

## 2020-11-09 NOTE — Patient Instructions (Signed)
Labs done today. We will contact you only if your labs are abnormal.  No medication changes were made. Please continue all current medications as prescribed.  Your physician recommends that you schedule a follow-up appointment in: 1 month with our [facility] here in our office  If you have any questions or concerns before your next appointment please send [place] a message through [place] or call our office at [phone].    TO LEAVE A MESSAGE FOR THE NURSE SELECT OPTION 2, PLEASE LEAVE A MESSAGE INCLUDING: YOUR NAME DATE OF BIRTH CALL BACK NUMBER REASON FOR CALL**this is important as we prioritize the call backs  YOU WILL RECEIVE A CALL BACK THE SAME DAY AS LONG AS YOU CALL BEFORE 4:00 PM   Do the following things EVERYDAY: Weigh yourself in the morning before breakfast. Write it down and keep it in a log. Take your medicines as prescribed Eat low salt foods--Limit salt (sodium) to 2000 mg per day.  Stay as active as you can everyday Limit all fluids for the day to less than 2 liters   At the [facility], you and your health needs are our priority. As part of our continuing mission to provide you with exceptional heart care, we have created designated Provider Care Teams. These Care Teams include your primary Cardiologist (physician) and Advanced Practice Providers (APPs- Physician Assistants and Nurse Practitioners) who all work together to provide you with the care you need, when you need it.   You may see any of the following providers on your designated Care Team at your next follow up: Dr Glori Bickers Dr Haynes Kerns, NP Lyda Jester, [place] Audry Riles, PharmD   Please be sure to bring in all your medications bottles to every appointment.    You are scheduled for a TEE/Cardioversion/TEE Cardioversion on Wednesday August 3rd 2022 with Dr. [name].  Please arrive at the [facility] (Main Entrance A) at [facility]: [address] [place], [place] [address] at 10 am(1 hour prior to procedure unless lab work is needed; if lab work is needed arrive 1.5 hours ahead)  DIET: Nothing to eat or drink after midnight except a sip of water with medications (see medication instructions below)  FYI: For your safety, and to allow [place] to monitor your vital signs accurately during the surgery/procedure we request that   if you have artificial nails, gel coating, SNS etc. Please have those removed prior to your surgery/procedure. Not having the nail coverings /polish removed may result in cancellation or delay of your surgery/procedure.  Medication Instructions: -Hold Lasix the day of your procedure -Continue your anticoagulant: Eliquis  You must have a responsible person to drive you home and stay in the waiting area during your procedure. Failure to do so could result in cancellation.  Bring your insurance cards.  *Special Note: Every effort is made to have your procedure done on time. Occasionally there are emergencies that occur at the hospital that may cause delays. Please be patient if a delay does occur.

## 2020-11-09 NOTE — H&P (View-Only) (Signed)
Date:  11/09/2020   ID:  Cynthia Frank, DOB Aug 05, 1935, MRN [id number]  Provider location: [facility] Type of Visit: Established patient   PCP:  Cynthia Koch, MD  Cardiologist:  Dr. Aundra Frank   History of Present Illness: Cynthia Frank is a 85 y.o. female who has a history of chronic systolic CHF/nonischemic cardiomyopathy, paroxysmal atrial fibrillation, and prior ovarian and colon cancers. She has had a cardiomyopathy known for > 15 years now. Cardiac cath at diagnosis showed mild nonobstructive disease.  EF has been persistently low.  Cause has been thought to be Adriamycin; she received this as part of her ovarian cancer treatment. She has had paroxysmal atrial fibrillation and has remained in NSR with amiodarone use.  No recent atrial fibrillation symptoms, typically feels prolonged palpitations thought to be atrial fibrillation 2-3 times/year.    She had had dyspnea with moderate to heavy exertion for 4-5 years prior to initial appt.  However, just before her initial appt, her symptoms had worsened.  She developed shortness of breath walking short distances on flat ground.  Lasix was increased to 40 mg bid and she lost weight and felt better.      Cardiac MRI was done in 1/17, showing EF 29% with normal RV.  Septal-lateral dyssynchrony was present and there was a non-coronary pattern of LGE in the septum.  LFTs were noted to be mildly elevated.  Amiodarone was decreased to 100 mg daily.    She had Medtronic CRT-D placed in 3/17. Repeat echo 8/17 showed EF 35% with diffuse hypokinesis, normal RV.  Echo in 4/19 showed EF 30-35%, mild AI.    On 11/12/17, she stood up, got lightheaded and passed out briefly with a fall.  Her ICD did not discharge.  She went to the ER and was found to have right C5 transverse process fracture.  She says that she has had lightheadedness with standing for years, comes and goes.  I decreased her Toprol XL.   In 8/20, she  had an episode of lightheadedness with standing followed by syncope and fall with right hip fracture.  She was admitted, had repair of right hip.  She was noted to be orthostatic and Entresto was stopped.  Echo in 8/20 showed EF 45-50% with normal RV systolic function.  In 1/21, patient was in atrial tachycardia and had been in it for several weeks.  She was volume overloaded.  Lasix was increased and she was brought into the hospital for DCCV, but she had converted to NSR on her own.    In 4/21, she had atrial fibrillation ablation.  TEE done in 4/21 showed EF 30-35% with global HK.  She is now off amiodarone.   Echo in 11/21 showed EF 25-30% with mild LV dilation, global hypokinesis with septal-lateral dyssynchrony, moderate MR, moderately decreased RV systolic function with normal RV size, IVC normal.   She went into atrial flutter in 11/21 and had DCCV back to NSR in 1/22.  She stayed in NSR for a few weeks but went back in atrial flutter again.  She had COVID-19 in 2/22.  Redo AF ablation in 3/22.   TEE in 3/22 showed EF 20-25%, moderate RV dysfunction, moderate TR, severe MR.  In 4/22, she had Mitraclip placement.  Repeat echo in 5/22 showed EF 20-25%, moderate RV dysfunction, s/p Mitraclip with mean gradient 2 with trivial MR.     She returns today for followup of CHF.  She has  been back in atrial flutter for several weeks, ECG today shows atrial flutter.  BiV pacing percentage has decreased.  She is short of breath walking a long distance. No problems walking up stairs.  No orthopnea/PND.  No chest pain.  No lightheadedness.  Weight is stable.   Medtronic device interrogation: Persistent atrial flutter, 49% BiV pacing.   ECG (personally reviewed): Atrial flutter, BiV pacing   Labs (1/17): K 4.2, creatinine 1.14, BNP 855 Labs (3/17): SPEP negative, BNP 341, K 4.5, creatinine 1.27, AST 62, ALT 74, TSH normal with low free T3 and normal free T4, HCT 46.5 Labs (4/17): AST 64, ALT 67, BNP 278,  K 3.6, creatinine 1.21 Labs (5/17): K 3.4, creatinine 1.39, AST 53, ALT 61, HBV/HCV negative, TSH mildly elevated, BNP 306 Labs (6/17): K 4.4, creatinine 1.36, free T4 normal, free T3 mildly decreased, AST 50, ALT 53. Labs (7/17): ANA negative, ASMA positive Labs (8/17): AST 45, ALT 37, K 3.8, creatinine 1.19, TSH normal Labs (2/18): TSH normal, LDL 47, LFTs, normal Labs (3/18): K normal, creatinine 1.23.  Labs (7/18): K 4, creatinine 1.32, LFTs normal Labs (7/19): K 3.8, creatinine 1.25, hgb 11.6 Labs (8/19): TSH mildly elevated, K 4.2, creatinine 1.22 Labs (10/19): K 4.1, creatinine 1.23, LFTs normal, TSH mildly elevated with normal free T3 and free T4.  Labs (1/20): K 3.5, creatinine 1.15, TSH elevated, free T3 and free T4 low, AST 63, ALT 51 Labs (5/20): K 3.8, creatinine 1.22, TSH normal, AST 49, ALT 43, tbili 0.9 Labs (11/20): K 4, creatinine 1.15  Labs (12/20): K 4.1, creatinine 1.25, TSH normal, AST 51, ALT 50 Labs (1/21): AST 145, ALT 107, tbili 1.8, TSH normal Labs (2/21): K 3.8, creatinine 1.38 Labs (3/21): AST 50, ALT 46 Labs (4/21): K 3.9, creatinine 0.96 Labs (5/21): K 4.5, creatinine 1.11, hgb 13.1, plts 141, AST 65, ALT 53 Labs (8/21): LDL 52, K 3.8, creatinine 1.11, AST 67, ALT 52 Labs (11/21): LFTs normal Labs (12/21): K 3.5, creatinine 1.33, LFTs normal Labs (3/22): K 4.6, creatinine 1.09, hgb 12.2 Labs (4/22): K 5.1, creatinine 1.19 Labs (5/22): K 3.5, creatinine 0.98, BNP 106.7, hgb 13.5, AST 42, ALT 29   PMH: 1. Chronic systolic CHF: Nonischemic cardiomyopathy, thought to be related to adriamycin that she had for treatment of ovarian cancer.  NICM diagnosed ~ 2001.  - LHC ~ 2001 with 30% stenosis D1.   - Echo (2013) EF 25-30%.  - Echo (10/14) with EF 25-30%. - Echo (12/16) with EF 20-25%, mild LV dilation, restrictive diastolic function, moderate MR, severe LAE. - Cardiac MRI (1/17): Moderately dilated LV with EF 29%. Diffuse hypokinesis looking worse in the  septal wall. Septal-lateral dyssynchrony. Normal RV size and systolic function.  Moderate MR with tethered posterior leaflet.  Mid-wall LGE in the basal to mid septum. This is not a coronary disease pattern. Could be suggestive of prior myocarditis. - Medtronic CRT-D placed 3/17.  - Echo (8/17): EF 35%, diffuse hypokinesis, normal RV size and systolic function, moderate MR.   - Echo (4/19): EF 30-35%, mild AI.  - Echo (8/20): EF 45-50%, diffuse hypokinesis, normal RV size and systolic function. - TEE (4/21): EF 30%, global hypokinesis.  - Echo (11/21): EF 25-30% with mild LV dilation, global hypokinesis with septal-lateral dyssynchrony, moderate MR, moderately decreased RV systolic function with normal RV size, IVC normal.  - Echo (5/22): EF 20-25%, moderate RV dysfunction, s/p Mitraclip with mean gradient 2 with trivial MR. 2. Atrial fibrillation/atrial tachycardia: Paroxysmal.  -  4/21 atrial fibrillation ablation. - 3/22 redo AF ablation  3. HTN 4. Ovarian cancer: 1985, s/p surgery. Received Adriamycin-based chemotherapy.  5. Colon cancer: 2007, s/p surgery.  6. PVCs 7. GERD 8. PFTs (8/16) without significant abnormality. 9. Prior syncope 10. Elevated transaminases: Uncertain etiology.  - Abdominal US (12/20): Normal-appearing liver.  11. COVID-19 infection: 2/22.  12. Atrial flutter: DCCV in 1/22.  13. Mitral regurgitation: Primarily functional.  S/p Mitraclip in 4/22.   Current Outpatient Medications  Medication Sig Dispense Refill   acetaminophen (TYLENOL) 500 MG tablet Take 500 mg by mouth every 6 (six) hours as needed for moderate pain or headache.     amoxicillin (AMOXIL) 500 MG tablet Take 4 tablets (2,000 mg total) by mouth once as needed for up to 1 dose (take 1 hour before dental procedure). 4 tablet 3   apixaban (ELIQUIS) 2.5 MG TABS tablet TAKE 1 TABLET(2.5 MG) BY MOUTH TWICE DAILY 180 tablet 1   atorvastatin (LIPITOR) 40 MG tablet TAKE 1 TABLET(40 MG) BY MOUTH DAILY AT 6  PM 90 tablet 3   betamethasone valerate lotion (VALISONE) 0.1 % Apply 1 application topically 2 (two) times daily.     citalopram (CELEXA) 20 MG tablet Take 20 mg by mouth daily.     Coenzyme Q10 (COQ10) 200 MG CAPS Take 200 mg by mouth daily.     conjugated estrogens (PREMARIN) vaginal cream Place 1 Applicatorful vaginally daily as needed (dyspareunia (Use as directed)).      diclofenac Sodium (VOLTAREN) 1 % GEL Apply 2 g topically daily.     FARXIGA 10 MG TABS tablet TAKE 1 TABLET(10 MG) BY MOUTH DAILY BEFORE BREAKFAST 30 tablet 6   furosemide (LASIX) 40 MG tablet Take 1 tablet (40 mg total) by mouth daily. 30 tablet 5   ipratropium (ATROVENT) 0.03 % nasal spray Place 1 spray into both nostrils 2 (two) times daily.     ketoconazole (NIZORAL) 2 % shampoo Apply 1 application topically 3 (three) times a week.     levothyroxine (SYNTHROID) 25 MCG tablet TAKE 1 TABLET(25 MCG) BY MOUTH DAILY BEFORE BREAKFAST 30 tablet 6   Magnesium Oxide 500 MG CAPS Take 1,000 mg by mouth 2 (two) times daily.      mupirocin ointment (BACTROBAN) 2 % Place 1 application into the nose 2 (two) times daily. 1 g 0   ofloxacin (OCUFLOX) 0.3 % ophthalmic solution Place 1 drop into the right eye 3 (three) times daily.     potassium chloride SA (KLOR-CON) 20 MEQ tablet Take 0.5 tablets (10 mEq total) by mouth daily. 90 tablet 3   spironolactone (ALDACTONE) 25 MG tablet TAKE 1 TABLET(25 MG) BY MOUTH AT BEDTIME 90 tablet 3   zolpidem (AMBIEN) 5 MG tablet Take 1 tablet (5 mg total) by mouth at bedtime as needed for sleep. 30 tablet 3   No current facility-administered medications for this encounter.    Allergies:   Clindamycin/lincomycin, Dramamine ii [meclizine hcl], Pneumococcal vaccines, and Sulfonamide derivatives   Social History:  The patient  reports that she quit smoking about 42 years ago. Her smoking use included cigarettes. She has a 10.00 pack-year smoking history. She has never used smokeless tobacco. She reports  current alcohol use of about 14.0 standard drinks of alcohol per week. She reports that she does not use drugs.   Family History:  The patient's family history includes Heart attack in her father; Heart disease in her father; Prostate cancer in her father; Uterine cancer in her mother.  ROS:  Please see the history of present illness.   All other systems are personally reviewed and negative.   Exam:  BP (!) 90/50   Pulse (!) 104   Wt 53.9 kg (118 lb 12.8 oz)   SpO2 97%   BMI 19.77 kg/m  General: NAD Neck: No JVD, no thyromegaly or thyroid nodule.  Lungs: Clear to auscultation bilaterally with normal respiratory effort. CV: Nondisplaced PMI.  Heart irregular S1/S2, no S3/S4, no murmur.  No peripheral edema.  No carotid bruit.  Normal pedal pulses.  Abdomen: Soft, nontender, no hepatosplenomegaly, no distention.  Skin: Intact without lesions or rashes.  Neurologic: Alert and oriented x 3.  Psych: Normal affect. Extremities: No clubbing or cyanosis.  HEENT: Normal.   Recent Labs: 07/18/2020: Magnesium 2.2 09/08/2020: B Natriuretic Peptide 1,067.3 11/09/2020: ALT 24; BUN 15; Creatinine, Ser 1.02; Hemoglobin 13.7; Platelets 131; Potassium 3.3; Sodium 134; TSH 6.870  Personally reviewed   Wt Readings from Last 3 Encounters:  11/09/20 53.9 kg (118 lb 12.8 oz)  11/03/20 54.3 kg (119 lb 9.6 oz)  09/08/20 53.8 kg (118 lb 9.6 oz)    ASSESSMENT AND PLAN:  1. Chronic systolic CHF: EF 0000000 with diffuse hypokinesis on 12/16 echo.  Nonischemic cardiomyopathy, probably related to Adriamycin with ovarian cancer treatment. Cardiac MRI in 1/17 showed EF 29% with normal RV and septal-lateral dyssynchrony.  There was a non-cardiac pattern of LGE in the septum, cannot rule out prior myocarditis based on this pattern.  She has a Medtronic CRT-D device. Echo 4/19 with EF 30-35%, then echo in 8/20 showed EF up to 45-50%.  TEE in 3/21 showed EF 30-35%.  Echo in 11/21 showed EF 25-30% with mild LV dilation,  global hypokinesis with septal-lateral dyssynchrony, moderate MR, moderately decreased RV systolic function with normal RV size, IVC normal.  In 4/22, she had Mitraclip placement. In 5/22, echo showed EF 20-25%, moderate RV dysfunction, s/p Mitraclip with mean gradient 2 with trivial MR. She is back in atrial flutter today with decreased BiV pacing and mildly worsened symptoms, NYHA class II-III.  She is not volume overloaded.  - She is off Toprol XL, she feels like it makes her excessively fatigued.  - Continue spironolactone 25 mg daily.  - She will stay off Entresto given syncope and lightheadedness while taking it.  Will not start losartan with soft BP.  - Continue Lasix 40 mg daily, BMET today.  - Continue Farxiga 10 mg daily.  - She will need to get back into NSR, see below.  2. Atrial arrhythmias: She needs to maintain NSR as she loses BiV pacing when she goes into atrial arrhythmias, triggering CHF exacerbation.  She had atrial fibrillation ablation in 4/21.  She went into atrial flutter in 11/21, she had DCCV in 12/21.  She is off amiodarone with elevated LFTs.  She had redo AF ablation in 3/22.  She has been in atrial flutter again for about 3 wks with loss of BiV pacing.  - Continue apixaban, no missed doses.  - I will arrange for DCCV.  We discussed risks/benefits and she agrees to procedure.  3. Hypothyroidism: Suspect related to prior amiodarone use.   - Continue Levoxyl.  4. Elevated LFTs: Mild elevation in past, resolved in 11/21.  Abdominal US showed unremarkable liver.  ?Related to amiodarone.  She is now back off amiodarone.  She has seen GI. Most recent LFTs were near normal.  - Check LFTs with labs today.   5. CAD: Calcification noted  in coronaries with pulmonary vein CT done prior to afib ablation.  - Continue atorvastatin 40 daily, good lipids in 8/21.  - No ASA given apixaban use.   Followup in 1 month with APP.   Signed, [name], MD  11/09/2020  [facility] [address] [facility] [place] [address] [phone] (office) [phone] (fax)

## 2020-11-12 ENCOUNTER — Telehealth (HOSPITAL_COMMUNITY): Payer: Self-pay

## 2020-11-12 DIAGNOSIS — H2512 Age-related nuclear cataract, left eye: Secondary | ICD-10-CM | POA: Diagnosis not present

## 2020-11-12 DIAGNOSIS — H25812 Combined forms of age-related cataract, left eye: Secondary | ICD-10-CM | POA: Diagnosis not present

## 2020-11-12 MED ORDER — POTASSIUM CHLORIDE CRYS ER 20 MEQ PO TBCR: 20.0000 meq | EXTENDED_RELEASE_TABLET | Freq: Every day | ORAL | 0 refills | Status: AC

## 2020-11-12 NOTE — Telephone Encounter (Signed)
Refill of potassium 20 meq sent into pharmacy

## 2020-11-16 DIAGNOSIS — Z1231 Encounter for screening mammogram for malignant neoplasm of breast: Secondary | ICD-10-CM | POA: Diagnosis not present

## 2020-11-18 ENCOUNTER — Encounter (HOSPITAL_COMMUNITY): Payer: Self-pay | Admitting: Cardiology

## 2020-11-18 ENCOUNTER — Ambulatory Visit (HOSPITAL_COMMUNITY): Payer: Medicare Other | Admitting: Anesthesiology

## 2020-11-18 ENCOUNTER — Ambulatory Visit (HOSPITAL_COMMUNITY)
Admission: RE | Admit: 2020-11-18 | Discharge: 2020-11-18 | Disposition: A | Discharge status: Home / Self Care | Payer: Medicare Other | Attending: Cardiology | Admitting: Cardiology

## 2020-11-18 ENCOUNTER — Encounter (HOSPITAL_COMMUNITY)
Admission: RE | Disposition: A | Discharge status: Home / Self Care | Payer: Self-pay | Source: Home / Self Care | Attending: Cardiology

## 2020-11-18 ENCOUNTER — Other Ambulatory Visit: Payer: Self-pay

## 2020-11-18 DIAGNOSIS — I5022 Chronic systolic (congestive) heart failure: Secondary | ICD-10-CM | POA: Insufficient documentation

## 2020-11-18 DIAGNOSIS — I480 Paroxysmal atrial fibrillation: Secondary | ICD-10-CM | POA: Diagnosis not present

## 2020-11-18 DIAGNOSIS — I110 Hypertensive heart disease with heart failure: Secondary | ICD-10-CM | POA: Diagnosis not present

## 2020-11-18 DIAGNOSIS — E039 Hypothyroidism, unspecified: Secondary | ICD-10-CM | POA: Diagnosis not present

## 2020-11-18 DIAGNOSIS — Z7901 Long term (current) use of anticoagulants: Secondary | ICD-10-CM | POA: Diagnosis not present

## 2020-11-18 DIAGNOSIS — Z8249 Family history of ischemic heart disease and other diseases of the circulatory system: Secondary | ICD-10-CM | POA: Insufficient documentation

## 2020-11-18 DIAGNOSIS — I428 Other cardiomyopathies: Secondary | ICD-10-CM | POA: Diagnosis not present

## 2020-11-18 DIAGNOSIS — Z887 Allergy status to serum and vaccine status: Secondary | ICD-10-CM | POA: Diagnosis not present

## 2020-11-18 DIAGNOSIS — Z882 Allergy status to sulfonamides status: Secondary | ICD-10-CM | POA: Diagnosis not present

## 2020-11-18 DIAGNOSIS — I2510 Atherosclerotic heart disease of native coronary artery without angina pectoris: Secondary | ICD-10-CM | POA: Diagnosis not present

## 2020-11-18 DIAGNOSIS — I4891 Unspecified atrial fibrillation: Secondary | ICD-10-CM | POA: Diagnosis not present

## 2020-11-18 DIAGNOSIS — Z87891 Personal history of nicotine dependence: Secondary | ICD-10-CM | POA: Insufficient documentation

## 2020-11-18 DIAGNOSIS — Z79890 Hormone replacement therapy (postmenopausal): Secondary | ICD-10-CM | POA: Diagnosis not present

## 2020-11-18 DIAGNOSIS — Z79899 Other long term (current) drug therapy: Secondary | ICD-10-CM | POA: Insufficient documentation

## 2020-11-18 DIAGNOSIS — Z8616 Personal history of COVID-19: Secondary | ICD-10-CM | POA: Insufficient documentation

## 2020-11-18 DIAGNOSIS — Z881 Allergy status to other antibiotic agents status: Secondary | ICD-10-CM | POA: Insufficient documentation

## 2020-11-18 HISTORY — PX: CARDIOVERSION: SHX1299

## 2020-11-18 SURGERY — CARDIOVERSION
Anesthesia: General

## 2020-11-18 MED ORDER — LIDOCAINE 2% (20 MG/ML) 5 ML SYRINGE
INTRAMUSCULAR | Status: DC | PRN
  Administered 2020-11-18: 60 mg via INTRAVENOUS

## 2020-11-18 MED ORDER — PROPOFOL 10 MG/ML IV BOLUS
INTRAVENOUS | Status: DC | PRN
  Administered 2020-11-18: 60 mg via INTRAVENOUS

## 2020-11-18 MED ORDER — SODIUM CHLORIDE 0.9 % IV SOLN: INTRAVENOUS | Status: DC | PRN

## 2020-11-18 NOTE — Transfer of Care (Signed)
Immediate Anesthesia Transfer of Care Note  Patient: [name]  Procedure(s) Performed: CARDIOVERSION  Patient Location: Endoscopy Unit  Anesthesia Type:General  Level of Consciousness: drowsy and patient cooperative  Airway & Oxygen Therapy: Patient Spontanous Breathing and Patient connected to nasal cannula oxygen  Post-op Assessment: Report given to RN and Post -op Vital signs reviewed and stable  Post vital signs: Reviewed and stable  Last Vitals:  Vitals Value Taken Time  BP    Temp    Pulse    Resp    SpO2      Last Pain:  Vitals:   11/18/20 1014  TempSrc: Temporal  PainSc: 0-No pain         Complications: No notable events documented.

## 2020-11-18 NOTE — Procedures (Signed)
Electrical Cardioversion Procedure Note Cynthia Frank [id number] 10-16-1935  Procedure: Electrical Cardioversion Indications:  Atrial Fibrillation  Procedure Details Consent: Risks of procedure as well as the alternatives and risks of each were explained to the (patient/caregiver).  Consent for procedure obtained. Time Out: Verified patient identification, verified procedure, site/side was marked, verified correct patient position, special equipment/implants available, medications/allergies/relevent history reviewed, required imaging and test results available.  Performed  Patient placed on cardiac monitor, pulse oximetry, supplemental oxygen as necessary.  Sedation given:  Propofol per anesthesiology Pacer pads placed anterior and posterior chest.  Cardioverted 1 time(s).  Cardioverted at [place].  Evaluation Findings: Post procedure EKG shows: NSR Complications: None Patient did tolerate procedure well.  Device checked after procedure, functioning normally with BiV pacing.    Cynthia Frank 11/18/2020, 11:49 AM

## 2020-11-18 NOTE — Interval H&P Note (Signed)
History and Physical Interval Note:  11/18/2020 11:39 AM  Cynthia Frank  has presented today for surgery, with the diagnosis of AFIB.  The various methods of treatment have been discussed with the patient and family. After consideration of risks, benefits and other options for treatment, the patient has consented to  Procedure(s): CARDIOVERSION (N/A) as a surgical intervention.  The patient's history has been reviewed, patient examined, no change in status, stable for surgery.  I have reviewed the patient's chart and labs.  Questions were answered to the patient's satisfaction.     Lakaya Tolen [employer]

## 2020-11-18 NOTE — Discharge Instructions (Signed)
Take Lasix 40 mg in the morning and 20 mg in the afternoon for the next 3 days, then back to just 40 mg in the morning after that.

## 2020-11-18 NOTE — Anesthesia Preprocedure Evaluation (Signed)
Anesthesia Evaluation  Patient identified by MRN, date of birth, ID band Patient awake    Reviewed: Allergy & Precautions, NPO status , Patient's Chart, lab work & pertinent test results  Airway Mallampati: II  TM Distance: >3 FB Neck ROM: Full    Dental  (+) Teeth Intact   Pulmonary neg pulmonary ROS, former smoker,    Pulmonary exam normal        Cardiovascular hypertension, Pt. on medications and Pt. on home beta blockers + CAD and +CHF  negative cardio ROS  Atrial Fibrillation + Cardiac Defibrillator + Valvular Problems/Murmurs MR  Rhythm:Regular Rate:Normal     Neuro/Psych negative neurological ROS  negative psych ROS   GI/Hepatic Neg liver ROS, GERD  Medicated,  Endo/Other  negative endocrine ROSdiabetes  Renal/GU negative Renal ROS  negative genitourinary   Musculoskeletal  (+) Arthritis , Osteoarthritis,    Abdominal (+)  Abdomen: soft. Bowel sounds: normal.  Peds negative pediatric ROS (+)  Hematology negative hematology ROS (+) anemia ,   Anesthesia Other Findings   Reproductive/Obstetrics negative OB ROS                             Anesthesia Physical  Anesthesia Plan  ASA: IV  Anesthesia Plan: General   Post-op Pain Management:    Induction: Intravenous  PONV Risk Score and Plan: 3 and Ondansetron and Treatment may vary due to age or medical condition  Airway Management Planned: Mask  Additional Equipment:   Intra-op Plan:   Post-operative Plan:   Informed Consent: I have reviewed the patients History and Physical, chart, labs and discussed the procedure including the risks, benefits and alternatives for the proposed anesthesia with the patient or authorized representative who has indicated his/her understanding and acceptance.     Dental advisory given  Plan Discussed with: CRNA  Anesthesia Plan Comments: (ECHO 03/22: 1. Severe Mitral  Regurgitation with pulmonary vein flow reversal and ERO  43 mm2. Mechanism appears to be atrial function and malcoaptation of P2-P3  as largest contributor. Estimated MVA 4.80 cm PML length 1.35 mm without  significant MAC. The mitral  valve is grossly normal. . The mean mitral valve gradient is 1.0 mmHg with  average heart rate of 80 bpm.  2. Left atrial size was moderately dilated. No left atrial/left atrial  appendage thrombus was detected.  3. Left ventricular ejection fraction, by estimation, is 20 to 25%. The  left ventricle has severely decreased function. The left ventricle  demonstrates global hypokinesis. The left ventricular internal cavity size  was moderately to severely dilated.  4. Right ventricular systolic function is moderately reduced. The right  ventricular size is normal.  5. Right atrial size was mild to moderately dilated.  6. Moderate and eccentric tricuspid regurgitation without evidence of  disastolic TR to favor device related.  7. The aortic valve is tricuspid. Aortic valve regurgitation is mild.  8. There is mild (Grade II) plaque involving the descending aorta.  Lab Results      Component                Value               Date                      WBC                      5.7  07/23/2020                HGB                      11.7 (L)            07/23/2020                HCT                      35.3 (L)            07/23/2020                MCV                      84.4                07/23/2020                PLT                      141 (L)             07/23/2020           Lab Results      Component                Value               Date                      NA                       130 (L)             07/23/2020                K                        3.6                 07/23/2020                CO2                      24                  07/23/2020                GLUCOSE                  97                  07/23/2020                 BUN                      18                  07/23/2020                CREATININE               1.13 (H)            07/23/2020                CALCIUM  8.8 (L)             07/23/2020                GFRNONAA                 49 (L)              07/23/2020                GFRAA                    54 (L)              12/12/2019          )        Anesthesia Quick Evaluation

## 2020-11-18 NOTE — Anesthesia Postprocedure Evaluation (Signed)
Anesthesia Post Note  Patient: Cynthia Frank  Procedure(s) Performed: CARDIOVERSION     Patient location during evaluation: Endoscopy Anesthesia Type: General Level of consciousness: awake and alert Pain management: pain level controlled Vital Signs Assessment: post-procedure vital signs reviewed and stable Respiratory status: spontaneous breathing, nonlabored ventilation and respiratory function stable Cardiovascular status: blood pressure returned to baseline and stable Postop Assessment: no apparent nausea or vomiting Anesthetic complications: no   No notable events documented.  Last Vitals:  Vitals:   11/18/20 1202 11/18/20 1212  BP: 93/62 (!) 97/57  Pulse: 66 70  Resp: 18 19  Temp:    SpO2: 95% 100%    Last Pain:  Vitals:   11/18/20 1212  TempSrc:   PainSc: 0-No pain                 Lynda Rainwater

## 2020-11-19 ENCOUNTER — Encounter (HOSPITAL_COMMUNITY): Payer: Self-pay | Admitting: Cardiology

## 2020-11-19 ENCOUNTER — Telehealth: Payer: Self-pay | Admitting: Internal Medicine

## 2020-11-19 NOTE — Progress Notes (Signed)
Remote ICD transmission.   

## 2020-11-19 NOTE — Telephone Encounter (Signed)
Nausea for four days Needs nausea medicine called in to  [facility], [place] [address] Phone:  [phone]  Fax:  [phone]

## 2020-11-20 NOTE — Telephone Encounter (Signed)
Yes, for new problem should have visit for assessment.

## 2020-11-20 NOTE — Telephone Encounter (Signed)
See my chart message

## 2020-11-24 ENCOUNTER — Ambulatory Visit (INDEPENDENT_AMBULATORY_CARE_PROVIDER_SITE_OTHER): Payer: Medicare Other | Admitting: Internal Medicine

## 2020-11-24 ENCOUNTER — Telehealth: Payer: Self-pay

## 2020-11-24 ENCOUNTER — Encounter: Payer: Self-pay | Admitting: Internal Medicine

## 2020-11-24 ENCOUNTER — Other Ambulatory Visit: Payer: Self-pay

## 2020-11-24 DIAGNOSIS — I2510 Atherosclerotic heart disease of native coronary artery without angina pectoris: Secondary | ICD-10-CM

## 2020-11-24 DIAGNOSIS — R110 Nausea: Secondary | ICD-10-CM

## 2020-11-24 DIAGNOSIS — I2583 Coronary atherosclerosis due to lipid rich plaque: Secondary | ICD-10-CM | POA: Diagnosis not present

## 2020-11-24 MED ORDER — ONDANSETRON HCL 4 MG PO TABS: 4.0000 mg | ORAL_TABLET | Freq: Three times a day (TID) | ORAL | 0 refills | Status: DC | PRN

## 2020-11-24 NOTE — Patient Instructions (Signed)
We have sent in the nausea medicine to have on hand if needed.

## 2020-11-24 NOTE — Telephone Encounter (Signed)
Spoke with patient and she reports she will be out of town on 8/15 which is day remote transmission is scheduled.  She will be gone for 10 days and encouraged her to take monitor if possible.  Will reschedule ICM remote transmission for 12/14/2020.

## 2020-11-24 NOTE — Progress Notes (Signed)
   Subjective:   Patient ID: Cynthia Frank, female    DOB: 04-25-1935, 85 y.o.   MRN: [id number]  HPI The patient is an 85 YO female coming in for nausea that started about a week ago and lasted 3-4 days. Now is better. Recent cataract surgery and recent cardioversion. New eye drops after cataract but no other medication changes.   Review of Systems  Constitutional:  Positive for activity change and appetite change.  HENT: Negative.    Eyes: Negative.   Respiratory:  Negative for cough, chest tightness and shortness of breath.   Cardiovascular:  Negative for chest pain, palpitations and leg swelling.  Gastrointestinal:  Positive for nausea. Negative for abdominal distention, abdominal pain, constipation, diarrhea and vomiting.  Musculoskeletal: Negative.   Skin: Negative.   Neurological: Negative.   Psychiatric/Behavioral: Negative.     Objective:  Physical Exam Constitutional:      Appearance: She is well-developed.  HENT:     Head: Normocephalic and atraumatic.  Cardiovascular:     Rate and Rhythm: Normal rate and regular rhythm.  Pulmonary:     Effort: Pulmonary effort is normal. No respiratory distress.     Breath sounds: Normal breath sounds. No wheezing or rales.  Abdominal:     General: Bowel sounds are normal. There is no distension.     Palpations: Abdomen is soft.     Tenderness: There is no abdominal tenderness. There is no rebound.  Musculoskeletal:     Cervical back: Normal range of motion.  Skin:    General: Skin is warm and dry.  Neurological:     Mental Status: She is alert and oriented to person, place, and time.     Coordination: Coordination normal.    Vitals:   11/24/20 0850  BP: 126/80  Pulse: 92  Resp: 18  Temp: 97.6 F (36.4 C)  TempSrc: Oral  SpO2: 99%  Weight: 121 lb 3.2 oz (55 kg)  Height: '5\' 5"'$  (1.651 m)    This visit occurred during the SARS-CoV-2 public health emergency.  Safety protocols were in place, including screening  questions prior to the visit, additional usage of staff PPE, and extensive cleaning of exam room while observing appropriate contact time as indicated for disinfecting solutions.   Assessment & Plan:

## 2020-11-24 NOTE — Assessment & Plan Note (Signed)
Most likely cause is medication induced from the eye drops post cataract surgery. Rx zofran to use prn if this returns (she is getting other eye done in the next 1-2 months). Counseled about making sure to avoid dehydration if unable to eat well.

## 2020-11-30 DIAGNOSIS — S0990XA Unspecified injury of head, initial encounter: Secondary | ICD-10-CM | POA: Diagnosis not present

## 2020-11-30 DIAGNOSIS — S72042A Displaced fracture of base of neck of left femur, initial encounter for closed fracture: Secondary | ICD-10-CM | POA: Diagnosis not present

## 2020-11-30 DIAGNOSIS — T1490XA Injury, unspecified, initial encounter: Secondary | ICD-10-CM | POA: Diagnosis not present

## 2020-11-30 DIAGNOSIS — S199XXA Unspecified injury of neck, initial encounter: Secondary | ICD-10-CM | POA: Diagnosis not present

## 2020-11-30 DIAGNOSIS — I5022 Chronic systolic (congestive) heart failure: Secondary | ICD-10-CM | POA: Diagnosis not present

## 2020-11-30 DIAGNOSIS — Z01818 Encounter for other preprocedural examination: Secondary | ICD-10-CM | POA: Diagnosis not present

## 2020-11-30 DIAGNOSIS — I480 Paroxysmal atrial fibrillation: Secondary | ICD-10-CM | POA: Diagnosis not present

## 2020-11-30 DIAGNOSIS — Z743 Need for continuous supervision: Secondary | ICD-10-CM | POA: Diagnosis not present

## 2020-12-01 DIAGNOSIS — R55 Syncope and collapse: Secondary | ICD-10-CM | POA: Diagnosis not present

## 2020-12-02 ENCOUNTER — Inpatient Hospital Stay (HOSPITAL_COMMUNITY): Payer: Medicare Other

## 2020-12-02 ENCOUNTER — Inpatient Hospital Stay (HOSPITAL_COMMUNITY): Payer: Medicare Other | Admitting: Anesthesiology

## 2020-12-02 ENCOUNTER — Encounter (HOSPITAL_COMMUNITY): Payer: Self-pay | Admitting: Orthopedic Surgery

## 2020-12-02 ENCOUNTER — Encounter (HOSPITAL_COMMUNITY)
Admission: AD | Disposition: A | Discharge status: Skilled Nursing Facility | Payer: Self-pay | Source: Other Acute Inpatient Hospital | Attending: Orthopedic Surgery

## 2020-12-02 ENCOUNTER — Other Ambulatory Visit: Payer: Self-pay

## 2020-12-02 ENCOUNTER — Inpatient Hospital Stay (HOSPITAL_COMMUNITY)
Admission: AD | Admit: 2020-12-02 | Discharge: 2020-12-04 | DRG: 522 | Disposition: A | Discharge status: Skilled Nursing Facility | Payer: Medicare Other | Source: Other Acute Inpatient Hospital | Attending: Orthopedic Surgery | Admitting: Orthopedic Surgery

## 2020-12-02 DIAGNOSIS — Z96642 Presence of left artificial hip joint: Secondary | ICD-10-CM | POA: Diagnosis not present

## 2020-12-02 DIAGNOSIS — Z79899 Other long term (current) drug therapy: Secondary | ICD-10-CM

## 2020-12-02 DIAGNOSIS — S72002A Fracture of unspecified part of neck of left femur, initial encounter for closed fracture: Principal | ICD-10-CM | POA: Diagnosis present

## 2020-12-02 DIAGNOSIS — R2681 Unsteadiness on feet: Secondary | ICD-10-CM | POA: Diagnosis not present

## 2020-12-02 DIAGNOSIS — R42 Dizziness and giddiness: Secondary | ICD-10-CM | POA: Diagnosis not present

## 2020-12-02 DIAGNOSIS — S72012A Unspecified intracapsular fracture of left femur, initial encounter for closed fracture: Secondary | ICD-10-CM | POA: Diagnosis not present

## 2020-12-02 DIAGNOSIS — Z9049 Acquired absence of other specified parts of digestive tract: Secondary | ICD-10-CM

## 2020-12-02 DIAGNOSIS — I429 Cardiomyopathy, unspecified: Secondary | ICD-10-CM | POA: Diagnosis not present

## 2020-12-02 DIAGNOSIS — M810 Age-related osteoporosis without current pathological fracture: Secondary | ICD-10-CM | POA: Diagnosis present

## 2020-12-02 DIAGNOSIS — I428 Other cardiomyopathies: Secondary | ICD-10-CM | POA: Diagnosis not present

## 2020-12-02 DIAGNOSIS — K589 Irritable bowel syndrome without diarrhea: Secondary | ICD-10-CM | POA: Diagnosis present

## 2020-12-02 DIAGNOSIS — Z01810 Encounter for preprocedural cardiovascular examination: Secondary | ICD-10-CM | POA: Diagnosis not present

## 2020-12-02 DIAGNOSIS — S72009A Fracture of unspecified part of neck of unspecified femur, initial encounter for closed fracture: Secondary | ICD-10-CM | POA: Diagnosis present

## 2020-12-02 DIAGNOSIS — Z7401 Bed confinement status: Secondary | ICD-10-CM | POA: Diagnosis not present

## 2020-12-02 DIAGNOSIS — Y92000 Kitchen of unspecified non-institutional (private) residence as  the place of occurrence of the external cause: Secondary | ICD-10-CM | POA: Diagnosis not present

## 2020-12-02 DIAGNOSIS — D62 Acute posthemorrhagic anemia: Secondary | ICD-10-CM | POA: Diagnosis not present

## 2020-12-02 DIAGNOSIS — Z20822 Contact with and (suspected) exposure to covid-19: Secondary | ICD-10-CM | POA: Diagnosis not present

## 2020-12-02 DIAGNOSIS — D696 Thrombocytopenia, unspecified: Secondary | ICD-10-CM | POA: Diagnosis not present

## 2020-12-02 DIAGNOSIS — D649 Anemia, unspecified: Secondary | ICD-10-CM | POA: Diagnosis not present

## 2020-12-02 DIAGNOSIS — Z8719 Personal history of other diseases of the digestive system: Secondary | ICD-10-CM

## 2020-12-02 DIAGNOSIS — R1310 Dysphagia, unspecified: Secondary | ICD-10-CM | POA: Diagnosis not present

## 2020-12-02 DIAGNOSIS — R41841 Cognitive communication deficit: Secondary | ICD-10-CM | POA: Diagnosis not present

## 2020-12-02 DIAGNOSIS — Z7901 Long term (current) use of anticoagulants: Secondary | ICD-10-CM

## 2020-12-02 DIAGNOSIS — R279 Unspecified lack of coordination: Secondary | ICD-10-CM | POA: Diagnosis not present

## 2020-12-02 DIAGNOSIS — M25552 Pain in left hip: Secondary | ICD-10-CM | POA: Diagnosis not present

## 2020-12-02 DIAGNOSIS — E871 Hypo-osmolality and hyponatremia: Secondary | ICD-10-CM | POA: Diagnosis present

## 2020-12-02 DIAGNOSIS — I5022 Chronic systolic (congestive) heart failure: Secondary | ICD-10-CM | POA: Diagnosis not present

## 2020-12-02 DIAGNOSIS — R531 Weakness: Secondary | ICD-10-CM | POA: Diagnosis not present

## 2020-12-02 DIAGNOSIS — Z8249 Family history of ischemic heart disease and other diseases of the circulatory system: Secondary | ICD-10-CM

## 2020-12-02 DIAGNOSIS — W1830XA Fall on same level, unspecified, initial encounter: Secondary | ICD-10-CM | POA: Diagnosis present

## 2020-12-02 DIAGNOSIS — R296 Repeated falls: Secondary | ICD-10-CM | POA: Diagnosis present

## 2020-12-02 DIAGNOSIS — Z95810 Presence of automatic (implantable) cardiac defibrillator: Secondary | ICD-10-CM

## 2020-12-02 DIAGNOSIS — I10 Essential (primary) hypertension: Secondary | ICD-10-CM | POA: Diagnosis not present

## 2020-12-02 DIAGNOSIS — Z8543 Personal history of malignant neoplasm of ovary: Secondary | ICD-10-CM | POA: Diagnosis not present

## 2020-12-02 DIAGNOSIS — Z882 Allergy status to sulfonamides status: Secondary | ICD-10-CM | POA: Diagnosis not present

## 2020-12-02 DIAGNOSIS — I110 Hypertensive heart disease with heart failure: Secondary | ICD-10-CM | POA: Diagnosis not present

## 2020-12-02 DIAGNOSIS — Z87891 Personal history of nicotine dependence: Secondary | ICD-10-CM

## 2020-12-02 DIAGNOSIS — I2510 Atherosclerotic heart disease of native coronary artery without angina pectoris: Secondary | ICD-10-CM | POA: Diagnosis not present

## 2020-12-02 DIAGNOSIS — Z8781 Personal history of (healed) traumatic fracture: Secondary | ICD-10-CM

## 2020-12-02 DIAGNOSIS — I4819 Other persistent atrial fibrillation: Secondary | ICD-10-CM | POA: Diagnosis present

## 2020-12-02 DIAGNOSIS — Z8042 Family history of malignant neoplasm of prostate: Secondary | ICD-10-CM

## 2020-12-02 DIAGNOSIS — E039 Hypothyroidism, unspecified: Secondary | ICD-10-CM | POA: Diagnosis not present

## 2020-12-02 DIAGNOSIS — E119 Type 2 diabetes mellitus without complications: Secondary | ICD-10-CM | POA: Diagnosis present

## 2020-12-02 DIAGNOSIS — Z8049 Family history of malignant neoplasm of other genital organs: Secondary | ICD-10-CM

## 2020-12-02 DIAGNOSIS — Z90710 Acquired absence of both cervix and uterus: Secondary | ICD-10-CM

## 2020-12-02 DIAGNOSIS — S72092D Other fracture of head and neck of left femur, subsequent encounter for closed fracture with routine healing: Secondary | ICD-10-CM | POA: Diagnosis not present

## 2020-12-02 DIAGNOSIS — Z881 Allergy status to other antibiotic agents status: Secondary | ICD-10-CM | POA: Diagnosis not present

## 2020-12-02 DIAGNOSIS — K219 Gastro-esophageal reflux disease without esophagitis: Secondary | ICD-10-CM | POA: Diagnosis present

## 2020-12-02 DIAGNOSIS — M6281 Muscle weakness (generalized): Secondary | ICD-10-CM | POA: Diagnosis not present

## 2020-12-02 DIAGNOSIS — Z96649 Presence of unspecified artificial hip joint: Secondary | ICD-10-CM

## 2020-12-02 DIAGNOSIS — Z96641 Presence of right artificial hip joint: Secondary | ICD-10-CM | POA: Diagnosis present

## 2020-12-02 DIAGNOSIS — E785 Hyperlipidemia, unspecified: Secondary | ICD-10-CM | POA: Diagnosis not present

## 2020-12-02 DIAGNOSIS — I4891 Unspecified atrial fibrillation: Secondary | ICD-10-CM | POA: Diagnosis not present

## 2020-12-02 DIAGNOSIS — S72042A Displaced fracture of base of neck of left femur, initial encounter for closed fracture: Secondary | ICD-10-CM | POA: Diagnosis not present

## 2020-12-02 DIAGNOSIS — Z888 Allergy status to other drugs, medicaments and biological substances status: Secondary | ICD-10-CM

## 2020-12-02 DIAGNOSIS — H269 Unspecified cataract: Secondary | ICD-10-CM | POA: Diagnosis present

## 2020-12-02 DIAGNOSIS — H9191 Unspecified hearing loss, right ear: Secondary | ICD-10-CM | POA: Diagnosis present

## 2020-12-02 DIAGNOSIS — Z9181 History of falling: Secondary | ICD-10-CM | POA: Diagnosis not present

## 2020-12-02 DIAGNOSIS — Z85038 Personal history of other malignant neoplasm of large intestine: Secondary | ICD-10-CM | POA: Diagnosis not present

## 2020-12-02 DIAGNOSIS — Z471 Aftercare following joint replacement surgery: Secondary | ICD-10-CM | POA: Diagnosis not present

## 2020-12-02 DIAGNOSIS — Z79890 Hormone replacement therapy (postmenopausal): Secondary | ICD-10-CM

## 2020-12-02 DIAGNOSIS — R2689 Other abnormalities of gait and mobility: Secondary | ICD-10-CM | POA: Diagnosis not present

## 2020-12-02 DIAGNOSIS — W19XXXA Unspecified fall, initial encounter: Secondary | ICD-10-CM | POA: Diagnosis not present

## 2020-12-02 DIAGNOSIS — Z7984 Long term (current) use of oral hypoglycemic drugs: Secondary | ICD-10-CM

## 2020-12-02 HISTORY — PX: TOTAL HIP ARTHROPLASTY: SHX124

## 2020-12-02 LAB — COMPREHENSIVE METABOLIC PANEL
ALT: 43 U/L (ref 0–44)
ALT: 41 U/L (ref 0–44)
AST: 29 U/L (ref 15–41)
AST: 28 U/L (ref 15–41)
Albumin: 3.5 g/dL (ref 3.5–5.0)
Albumin: 3.4 g/dL — ABNORMAL LOW (ref 3.5–5.0)
Alkaline Phosphatase: 66 U/L (ref 38–126)
Alkaline Phosphatase: 63 U/L (ref 38–126)
Anion gap: 8 (ref 5–15)
Anion gap: 10 (ref 5–15)
BUN: 19 mg/dL (ref 8–23)
BUN: 21 mg/dL (ref 8–23)
CO2: 22 mmol/L (ref 22–32)
CO2: 23 mmol/L (ref 22–32)
Calcium: 8.5 mg/dL — ABNORMAL LOW (ref 8.9–10.3)
Calcium: 8.5 mg/dL — ABNORMAL LOW (ref 8.9–10.3)
Chloride: 101 mmol/L (ref 98–111)
Chloride: 99 mmol/L (ref 98–111)
Creatinine, Ser: 0.86 mg/dL (ref 0.44–1.00)
Creatinine, Ser: 0.93 mg/dL (ref 0.44–1.00)
GFR, Estimated: >60 mL/min (ref >60)
GFR, Estimated: >60 mL/min (ref >60)
Glucose, Bld: 118 mg/dL — ABNORMAL HIGH (ref 70–99)
Glucose, Bld: 119 mg/dL — ABNORMAL HIGH (ref 70–99)
Potassium: 4.7 mmol/L (ref 3.5–5.1)
Potassium: 4.5 mmol/L (ref 3.5–5.1)
Sodium: 131 mmol/L — ABNORMAL LOW (ref 135–145)
Sodium: 132 mmol/L — ABNORMAL LOW (ref 135–145)
Total Bilirubin: 2.4 mg/dL — ABNORMAL HIGH (ref 0.3–1.2)
Total Bilirubin: 2.5 mg/dL — ABNORMAL HIGH (ref 0.3–1.2)
Total Protein: 6.6 g/dL (ref 6.5–8.1)
Total Protein: 6.6 g/dL (ref 6.5–8.1)

## 2020-12-02 LAB — SARS CORONAVIRUS 2 (TAT 6-24 HRS): SARS Coronavirus 2: NEGATIVE

## 2020-12-02 LAB — CBC WITH DIFFERENTIAL/PLATELET
Abs Immature Granulocytes: 0.02 10*3/uL (ref 0.00–0.07)
Abs Immature Granulocytes: 0.03 10*3/uL (ref 0.00–0.07)
Basophils Absolute: 0 10*3/uL (ref 0.0–0.1)
Basophils Absolute: 0 10*3/uL (ref 0.0–0.1)
Basophils Relative: 1 %
Basophils Relative: 1 %
Eosinophils Absolute: 0 10*3/uL (ref 0.0–0.5)
Eosinophils Absolute: 0.1 10*3/uL (ref 0.0–0.5)
Eosinophils Relative: 0 %
Eosinophils Relative: 2 %
HCT: 37.3 % (ref 36.0–46.0)
HCT: 37.1 % (ref 36.0–46.0)
Hemoglobin: 12.2 g/dL (ref 12.0–15.0)
Hemoglobin: 12.1 g/dL (ref 12.0–15.0)
Immature Granulocytes: 0 %
Immature Granulocytes: 1 %
Lymphocytes Relative: 11 %
Lymphocytes Relative: 8 %
Lymphs Abs: 0.7 10*3/uL (ref 0.7–4.0)
Lymphs Abs: 0.5 10*3/uL — ABNORMAL LOW (ref 0.7–4.0)
MCH: 31 pg (ref 26.0–34.0)
MCH: 31.5 pg (ref 26.0–34.0)
MCHC: 32.7 g/dL (ref 30.0–36.0)
MCHC: 32.6 g/dL (ref 30.0–36.0)
MCV: 94.7 fL (ref 80.0–100.0)
MCV: 96.6 fL (ref 80.0–100.0)
Monocytes Absolute: 0.9 10*3/uL (ref 0.1–1.0)
Monocytes Absolute: 0.8 10*3/uL (ref 0.1–1.0)
Monocytes Relative: 14 %
Monocytes Relative: 13 %
Neutro Abs: 4.6 10*3/uL (ref 1.7–7.7)
Neutro Abs: 4.2 10*3/uL (ref 1.7–7.7)
Neutrophils Relative %: 74 %
Neutrophils Relative %: 75 %
Platelets: 132 10*3/uL — ABNORMAL LOW (ref 150–400)
Platelets: 132 10*3/uL — ABNORMAL LOW (ref 150–400)
RBC: 3.94 MIL/uL (ref 3.87–5.11)
RBC: 3.84 MIL/uL — ABNORMAL LOW (ref 3.87–5.11)
RDW: 14.5 % (ref 11.5–15.5)
RDW: 14.6 % (ref 11.5–15.5)
WBC: 6.2 10*3/uL (ref 4.0–10.5)
WBC: 5.6 10*3/uL (ref 4.0–10.5)
nRBC: 0 % (ref 0.0–0.2)
nRBC: 0 % (ref 0.0–0.2)

## 2020-12-02 LAB — PROTIME-INR
INR: 1.3 — ABNORMAL HIGH (ref 0.8–1.2)
INR: 1.4 — ABNORMAL HIGH (ref 0.8–1.2)
Prothrombin Time: 16.2 seconds — ABNORMAL HIGH (ref 11.4–15.2)
Prothrombin Time: 17.6 seconds — ABNORMAL HIGH (ref 11.4–15.2)

## 2020-12-02 LAB — SURGICAL PCR SCREEN
MRSA, PCR: NEGATIVE
Staphylococcus aureus: NEGATIVE

## 2020-12-02 LAB — GLUCOSE, CAPILLARY
Glucose-Capillary: 116 mg/dL — ABNORMAL HIGH (ref 70–99)
Glucose-Capillary: 137 mg/dL — ABNORMAL HIGH (ref 70–99)
Glucose-Capillary: 155 mg/dL — ABNORMAL HIGH (ref 70–99)

## 2020-12-02 LAB — HEMOGLOBIN A1C
Hgb A1c MFr Bld: 6.1 % — ABNORMAL HIGH (ref 4.8–5.6)
Mean Plasma Glucose: 128.37 mg/dL

## 2020-12-02 LAB — PREPARE RBC (CROSSMATCH)

## 2020-12-02 SURGERY — ARTHROPLASTY, HIP, TOTAL, ANTERIOR APPROACH
Anesthesia: General | Site: Hip | Laterality: Left

## 2020-12-02 MED ORDER — CEFAZOLIN SODIUM-DEXTROSE 2-4 GM/100ML-% IV SOLN
2.0000 g | Freq: Four times a day (QID) | INTRAVENOUS | Status: AC
  Administered 2020-12-03 (×2): 2 g via INTRAVENOUS
  Filled 2020-12-02 (×2): qty 100

## 2020-12-02 MED ORDER — EPHEDRINE SULFATE-NACL 50-0.9 MG/10ML-% IV SOSY
PREFILLED_SYRINGE | INTRAVENOUS | Status: DC | PRN
  Administered 2020-12-02: 10 mg via INTRAVENOUS

## 2020-12-02 MED ORDER — LIDOCAINE 2% (20 MG/ML) 5 ML SYRINGE
INTRAMUSCULAR | Status: DC | PRN
  Administered 2020-12-02: 60 mg via INTRAVENOUS

## 2020-12-02 MED ORDER — SODIUM CHLORIDE 0.9 % IR SOLN
Status: DC | PRN
  Administered 2020-12-02: 1000 mL

## 2020-12-02 MED ORDER — METOCLOPRAMIDE HCL 5 MG/ML IJ SOLN: 5.0000 mg | Freq: Three times a day (TID) | INTRAMUSCULAR | Status: DC | PRN

## 2020-12-02 MED ORDER — INSULIN ASPART 100 UNIT/ML IJ SOLN
0.0000 [IU] | INTRAMUSCULAR | Status: DC
Start: 2020-12-02 — End: 2020-12-04
  Administered 2020-12-03 (×3): 1 [IU] via SUBCUTANEOUS
  Administered 2020-12-03: 2 [IU] via SUBCUTANEOUS

## 2020-12-02 MED ORDER — CITALOPRAM HYDROBROMIDE 20 MG PO TABS
20.0000 mg | ORAL_TABLET | Freq: Every day | ORAL | Status: DC
  Administered 2020-12-02 – 2020-12-04 (×3): 20 mg via ORAL
  Filled 2020-12-02 (×3): qty 1

## 2020-12-02 MED ORDER — DIPHENHYDRAMINE HCL 12.5 MG/5ML PO ELIX: 12.5000 mg | ORAL_SOLUTION | ORAL | Status: DC | PRN

## 2020-12-02 MED ORDER — FERROUS SULFATE 325 (65 FE) MG PO TABS
325.0000 mg | ORAL_TABLET | Freq: Three times a day (TID) | ORAL | Status: DC
  Administered 2020-12-03 – 2020-12-04 (×6): 325 mg via ORAL
  Filled 2020-12-02 (×6): qty 1

## 2020-12-02 MED ORDER — MENTHOL 3 MG MT LOZG: 1.0000 | LOZENGE | OROMUCOSAL | Status: DC | PRN

## 2020-12-02 MED ORDER — DEXAMETHASONE SODIUM PHOSPHATE 10 MG/ML IJ SOLN
INTRAMUSCULAR | Status: DC | PRN
  Administered 2020-12-02: 5 mg via INTRAVENOUS

## 2020-12-02 MED ORDER — TRANEXAMIC ACID-NACL 1000-0.7 MG/100ML-% IV SOLN
1000.0000 mg | INTRAVENOUS | Status: AC
  Administered 2020-12-02: 1000 mg via INTRAVENOUS
  Filled 2020-12-02: qty 100

## 2020-12-02 MED ORDER — ACETAMINOPHEN 325 MG PO TABS
325.0000 mg | ORAL_TABLET | Freq: Four times a day (QID) | ORAL | Status: DC | PRN
Start: 2020-12-03 — End: 2020-12-04
  Administered 2020-12-03: 650 mg via ORAL
  Filled 2020-12-02: qty 2

## 2020-12-02 MED ORDER — METHOCARBAMOL 500 MG PO TABS: 500.0000 mg | ORAL_TABLET | Freq: Four times a day (QID) | ORAL | Status: DC | PRN

## 2020-12-02 MED ORDER — LEVOTHYROXINE SODIUM 25 MCG PO TABS
25.0000 ug | ORAL_TABLET | Freq: Every day | ORAL | Status: DC
  Administered 2020-12-03 – 2020-12-04 (×2): 25 ug via ORAL
  Filled 2020-12-02 (×2): qty 1

## 2020-12-02 MED ORDER — ROCURONIUM BROMIDE 10 MG/ML (PF) SYRINGE
PREFILLED_SYRINGE | INTRAVENOUS | Status: DC | PRN
  Administered 2020-12-02: 40 mg via INTRAVENOUS

## 2020-12-02 MED ORDER — PHENYLEPHRINE 40 MCG/ML (10ML) SYRINGE FOR IV PUSH (FOR BLOOD PRESSURE SUPPORT)
PREFILLED_SYRINGE | INTRAVENOUS | Status: DC | PRN
  Administered 2020-12-02: 120 ug via INTRAVENOUS
  Administered 2020-12-02 (×2): 80 ug via INTRAVENOUS

## 2020-12-02 MED ORDER — BISACODYL 10 MG RE SUPP: 10.0000 mg | Freq: Every day | RECTAL | Status: DC | PRN

## 2020-12-02 MED ORDER — ROCURONIUM BROMIDE 10 MG/ML (PF) SYRINGE
PREFILLED_SYRINGE | INTRAVENOUS | Status: AC
  Filled 2020-12-02: qty 10

## 2020-12-02 MED ORDER — IPRATROPIUM BROMIDE 0.03 % NA SOLN
1.0000 | Freq: Two times a day (BID) | NASAL | Status: DC
  Administered 2020-12-02 – 2020-12-04 (×4): 1 via NASAL
  Filled 2020-12-02: qty 30

## 2020-12-02 MED ORDER — METOCLOPRAMIDE HCL 5 MG PO TABS: 5.0000 mg | ORAL_TABLET | Freq: Three times a day (TID) | ORAL | Status: DC | PRN

## 2020-12-02 MED ORDER — HYDROCODONE-ACETAMINOPHEN 5-325 MG PO TABS
1.0000 | ORAL_TABLET | Freq: Four times a day (QID) | ORAL | Status: DC | PRN
  Administered 2020-12-02: 1 via ORAL
  Filled 2020-12-02: qty 1

## 2020-12-02 MED ORDER — CEFAZOLIN SODIUM-DEXTROSE 2-4 GM/100ML-% IV SOLN
2.0000 g | INTRAVENOUS | Status: AC
  Administered 2020-12-02: 2 g via INTRAVENOUS
  Filled 2020-12-02: qty 100

## 2020-12-02 MED ORDER — FENTANYL CITRATE (PF) 100 MCG/2ML IJ SOLN
INTRAMUSCULAR | Status: AC
  Filled 2020-12-02: qty 2

## 2020-12-02 MED ORDER — SODIUM CHLORIDE 0.9 % IV SOLN: INTRAVENOUS | Status: AC

## 2020-12-02 MED ORDER — FUROSEMIDE 40 MG PO TABS
40.0000 mg | ORAL_TABLET | Freq: Every day | ORAL | Status: DC
  Administered 2020-12-03: 40 mg via ORAL
  Filled 2020-12-02: qty 1

## 2020-12-02 MED ORDER — GATIFLOXACIN 0.5 % OP SOLN
1.0000 [drp] | Freq: Three times a day (TID) | OPHTHALMIC | Status: DC
  Administered 2020-12-02 – 2020-12-04 (×6): 1 [drp] via OPHTHALMIC
  Filled 2020-12-02: qty 2.5

## 2020-12-02 MED ORDER — CHLORHEXIDINE GLUCONATE 4 % EX LIQD: 60.0000 mL | Freq: Once | CUTANEOUS | Status: DC

## 2020-12-02 MED ORDER — ENSURE SURGERY PO LIQD
237.0000 mL | Freq: Two times a day (BID) | ORAL | Status: DC
  Administered 2020-12-03 – 2020-12-04 (×2): 237 mL via ORAL

## 2020-12-02 MED ORDER — LIDOCAINE 2% (20 MG/ML) 5 ML SYRINGE
INTRAMUSCULAR | Status: AC
  Filled 2020-12-02: qty 5

## 2020-12-02 MED ORDER — HYDROCODONE-ACETAMINOPHEN 5-325 MG PO TABS
1.0000 | ORAL_TABLET | ORAL | Status: DC | PRN
  Administered 2020-12-03 – 2020-12-04 (×2): 1 via ORAL
  Filled 2020-12-02 (×2): qty 1

## 2020-12-02 MED ORDER — ADULT MULTIVITAMIN W/MINERALS CH
1.0000 | ORAL_TABLET | Freq: Every day | ORAL | Status: DC
  Administered 2020-12-02 – 2020-12-04 (×3): 1 via ORAL
  Filled 2020-12-02 (×3): qty 1

## 2020-12-02 MED ORDER — PREDNISOLONE ACETATE 1 % OP SUSP
1.0000 [drp] | Freq: Three times a day (TID) | OPHTHALMIC | Status: DC
  Administered 2020-12-02 – 2020-12-04 (×6): 1 [drp] via OPHTHALMIC
  Filled 2020-12-02: qty 5

## 2020-12-02 MED ORDER — PHENYLEPHRINE 40 MCG/ML (10ML) SYRINGE FOR IV PUSH (FOR BLOOD PRESSURE SUPPORT)
PREFILLED_SYRINGE | INTRAVENOUS | Status: AC
  Filled 2020-12-02: qty 30

## 2020-12-02 MED ORDER — POTASSIUM CHLORIDE CRYS ER 20 MEQ PO TBCR
20.0000 meq | EXTENDED_RELEASE_TABLET | Freq: Every day | ORAL | Status: DC
  Administered 2020-12-03 – 2020-12-04 (×2): 20 meq via ORAL
  Filled 2020-12-02 (×2): qty 1

## 2020-12-02 MED ORDER — PHENYLEPHRINE HCL (PRESSORS) 10 MG/ML IV SOLN
INTRAVENOUS | Status: AC
  Filled 2020-12-02: qty 1

## 2020-12-02 MED ORDER — ZOLPIDEM TARTRATE 5 MG PO TABS
5.0000 mg | ORAL_TABLET | Freq: Every evening | ORAL | Status: DC | PRN
  Administered 2020-12-03: 5 mg via ORAL
  Filled 2020-12-02: qty 1

## 2020-12-02 MED ORDER — LACTATED RINGERS IV SOLN: INTRAVENOUS | Status: DC | PRN

## 2020-12-02 MED ORDER — SPIRONOLACTONE 25 MG PO TABS
25.0000 mg | ORAL_TABLET | Freq: Every day | ORAL | Status: DC
  Administered 2020-12-03 – 2020-12-04 (×2): 25 mg via ORAL
  Filled 2020-12-02 (×2): qty 1

## 2020-12-02 MED ORDER — LACTATED RINGERS IV SOLN: INTRAVENOUS | Status: DC

## 2020-12-02 MED ORDER — PHENOL 1.4 % MT LIQD: 1.0000 | OROMUCOSAL | Status: DC | PRN

## 2020-12-02 MED ORDER — ONDANSETRON HCL 4 MG/2ML IJ SOLN
INTRAMUSCULAR | Status: DC | PRN
  Administered 2020-12-02: 4 mg via INTRAVENOUS

## 2020-12-02 MED ORDER — ATORVASTATIN CALCIUM 40 MG PO TABS
40.0000 mg | ORAL_TABLET | Freq: Every day | ORAL | Status: DC
  Administered 2020-12-02 – 2020-12-03 (×2): 40 mg via ORAL
  Filled 2020-12-02 (×2): qty 1

## 2020-12-02 MED ORDER — DEXAMETHASONE SODIUM PHOSPHATE 10 MG/ML IJ SOLN
INTRAMUSCULAR | Status: AC
  Filled 2020-12-02: qty 1

## 2020-12-02 MED ORDER — ONDANSETRON HCL 4 MG/2ML IJ SOLN
INTRAMUSCULAR | Status: AC
  Filled 2020-12-02: qty 2

## 2020-12-02 MED ORDER — PHENYLEPHRINE HCL-NACL 20-0.9 MG/250ML-% IV SOLN
INTRAVENOUS | Status: DC | PRN
Start: 2020-12-02 — End: 2020-12-02
  Administered 2020-12-02: 75 ug/min via INTRAVENOUS

## 2020-12-02 MED ORDER — FENTANYL CITRATE (PF) 100 MCG/2ML IJ SOLN
INTRAMUSCULAR | Status: DC | PRN
  Administered 2020-12-02 (×4): 25 ug via INTRAVENOUS

## 2020-12-02 MED ORDER — ASPIRIN EC 325 MG PO TBEC: 325.0000 mg | DELAYED_RELEASE_TABLET | Freq: Every day | ORAL | Status: DC

## 2020-12-02 MED ORDER — CHLORHEXIDINE GLUCONATE 0.12 % MT SOLN
15.0000 mL | OROMUCOSAL | Status: AC
  Administered 2020-12-02: 15 mL via OROMUCOSAL

## 2020-12-02 MED ORDER — FENTANYL CITRATE (PF) 100 MCG/2ML IJ SOLN: 25.0000 ug | INTRAMUSCULAR | Status: DC | PRN

## 2020-12-02 MED ORDER — POLYETHYLENE GLYCOL 3350 17 G PO PACK: 17.0000 g | PACK | Freq: Every day | ORAL | Status: DC | PRN

## 2020-12-02 MED ORDER — LIDOCAINE 2% (20 MG/ML) 5 ML SYRINGE
INTRAMUSCULAR | Status: AC
  Filled 2020-12-02: qty 10

## 2020-12-02 MED ORDER — POVIDONE-IODINE 10 % EX SWAB
2.0000 "application " | Freq: Once | CUTANEOUS | Status: AC
  Administered 2020-12-02: 2 via TOPICAL

## 2020-12-02 MED ORDER — ALBUMIN HUMAN 5 % IV SOLN
INTRAVENOUS | Status: AC
  Filled 2020-12-02: qty 500

## 2020-12-02 MED ORDER — TRANEXAMIC ACID-NACL 1000-0.7 MG/100ML-% IV SOLN
1000.0000 mg | Freq: Once | INTRAVENOUS | Status: AC
  Administered 2020-12-02: 1000 mg via INTRAVENOUS
  Filled 2020-12-02: qty 100

## 2020-12-02 MED ORDER — MORPHINE SULFATE (PF) 2 MG/ML IV SOLN
0.5000 mg | INTRAVENOUS | Status: DC | PRN
  Administered 2020-12-02: 0.5 mg via INTRAVENOUS
  Filled 2020-12-02: qty 1

## 2020-12-02 MED ORDER — EPHEDRINE 5 MG/ML INJ
INTRAVENOUS | Status: AC
  Filled 2020-12-02: qty 5

## 2020-12-02 MED ORDER — DEXAMETHASONE SODIUM PHOSPHATE 10 MG/ML IJ SOLN
INTRAMUSCULAR | Status: DC | PRN
  Administered 2020-12-02: 4 mg via INTRAVENOUS

## 2020-12-02 MED ORDER — MORPHINE SULFATE (PF) 4 MG/ML IV SOLN: 0.5000 mg | INTRAVENOUS | Status: DC | PRN

## 2020-12-02 MED ORDER — PROPOFOL 10 MG/ML IV BOLUS
INTRAVENOUS | Status: DC | PRN
  Administered 2020-12-02: 60 mg via INTRAVENOUS

## 2020-12-02 MED ORDER — AMISULPRIDE (ANTIEMETIC) 5 MG/2ML IV SOLN: 10.0000 mg | Freq: Once | INTRAVENOUS | Status: DC | PRN

## 2020-12-02 MED ORDER — DEXAMETHASONE SODIUM PHOSPHATE 10 MG/ML IJ SOLN
10.0000 mg | Freq: Once | INTRAMUSCULAR | Status: AC
  Administered 2020-12-03: 10 mg via INTRAVENOUS
  Filled 2020-12-02: qty 1

## 2020-12-02 MED ORDER — HYDROCODONE-ACETAMINOPHEN 7.5-325 MG PO TABS
1.0000 | ORAL_TABLET | ORAL | Status: DC | PRN
  Administered 2020-12-03: 1 via ORAL
  Filled 2020-12-02: qty 1

## 2020-12-02 MED ORDER — APIXABAN 2.5 MG PO TABS
2.5000 mg | ORAL_TABLET | Freq: Two times a day (BID) | ORAL | Status: DC
  Administered 2020-12-03 – 2020-12-04 (×3): 2.5 mg via ORAL
  Filled 2020-12-02 (×3): qty 1

## 2020-12-02 MED ORDER — SODIUM CHLORIDE 0.9 % IV SOLN: INTRAVENOUS | Status: DC

## 2020-12-02 MED ORDER — METHOCARBAMOL 500 MG IVPB - SIMPLE MED
500.0000 mg | Freq: Four times a day (QID) | INTRAVENOUS | Status: DC | PRN
  Filled 2020-12-02: qty 50

## 2020-12-02 MED ORDER — ALBUMIN HUMAN 5 % IV SOLN: INTRAVENOUS | Status: DC | PRN

## 2020-12-02 MED ORDER — DOCUSATE SODIUM 100 MG PO CAPS
100.0000 mg | ORAL_CAPSULE | Freq: Two times a day (BID) | ORAL | Status: DC
  Administered 2020-12-02 – 2020-12-04 (×4): 100 mg via ORAL
  Filled 2020-12-02 (×4): qty 1

## 2020-12-02 MED ORDER — ONDANSETRON HCL 4 MG/2ML IJ SOLN: 4.0000 mg | Freq: Four times a day (QID) | INTRAMUSCULAR | Status: DC | PRN

## 2020-12-02 MED ORDER — ONDANSETRON HCL 4 MG PO TABS: 4.0000 mg | ORAL_TABLET | Freq: Four times a day (QID) | ORAL | Status: DC | PRN

## 2020-12-02 MED ORDER — SUGAMMADEX SODIUM 200 MG/2ML IV SOLN
INTRAVENOUS | Status: DC | PRN
  Administered 2020-12-02: 150 mg via INTRAVENOUS

## 2020-12-02 SURGICAL SUPPLY — 41 items
BAG COUNTER SPONGE SURGICOUNT (BAG) IMPLANT
BAG DECANTER FOR FLEXI CONT (MISCELLANEOUS) IMPLANT
BAG ZIPLOCK 12X15 (MISCELLANEOUS) IMPLANT
BALL HIP ARTICU EZE 36 8.5 (Hips) ×1 IMPLANT
BLADE SAG 18X100X1.27 (BLADE) ×2 IMPLANT
COVER PERINEAL POST (MISCELLANEOUS) ×2 IMPLANT
COVER SURGICAL LIGHT HANDLE (MISCELLANEOUS) ×2 IMPLANT
CUP ACETBLR 52 OD PINNACLE (Hips) ×2 IMPLANT
DERMABOND ADVANCED (GAUZE/BANDAGES/DRESSINGS) ×1
DERMABOND ADVANCED .7 DNX12 (GAUZE/BANDAGES/DRESSINGS) ×1 IMPLANT
DRAPE FOOT SWITCH (DRAPES) ×2 IMPLANT
DRAPE STERI IOBAN 125X83 (DRAPES) ×2 IMPLANT
DRAPE U-SHAPE 47X51 STRL (DRAPES) ×4 IMPLANT
DRESSING AQUACEL AG SP 3.5X10 (GAUZE/BANDAGES/DRESSINGS) ×1 IMPLANT
DRSG AQUACEL AG ADV 3.5X10 (GAUZE/BANDAGES/DRESSINGS) ×2 IMPLANT
DRSG AQUACEL AG SP 3.5X10 (GAUZE/BANDAGES/DRESSINGS) ×2
DURAPREP 26ML APPLICATOR (WOUND CARE) ×2 IMPLANT
ELECT REM PT RETURN 15FT ADLT (MISCELLANEOUS) ×2 IMPLANT
ELIMINATOR HOLE APEX DEPUY (Hips) ×2 IMPLANT
GLOVE SURG ENC MOIS LTX SZ6 (GLOVE) ×4 IMPLANT
GLOVE SURG UNDER LTX SZ7.5 (GLOVE) ×2 IMPLANT
GLOVE SURG UNDER POLY LF SZ6.5 (GLOVE) ×2 IMPLANT
GLOVE SURG UNDER POLY LF SZ7.5 (GLOVE) ×4 IMPLANT
GOWN STRL REUS W/TWL LRG LVL3 (GOWN DISPOSABLE) ×8 IMPLANT
HIP BALL ARTICU EZE 36 8.5 (Hips) ×2 IMPLANT
HOLDER FOLEY CATH W/STRAP (MISCELLANEOUS) ×2 IMPLANT
KIT TURNOVER KIT A (KITS) ×2 IMPLANT
LINER NEUTRAL 52X36MM PLUS 4 (Liner) ×2 IMPLANT
PACK ANTERIOR HIP CUSTOM (KITS) ×2 IMPLANT
PENCIL SMOKE EVACUATOR (MISCELLANEOUS) ×2 IMPLANT
SCREW 6.5MMX35MM (Screw) ×2 IMPLANT
STEM FEM ACTIS STD SZ4 (Stem) ×2 IMPLANT
SUT MNCRL AB 4-0 PS2 18 (SUTURE) ×2 IMPLANT
SUT STRATAFIX 0 PDS 27 VIOLET (SUTURE) ×2
SUT VIC AB 1 CT1 36 (SUTURE) ×6 IMPLANT
SUT VIC AB 2-0 CT1 27 (SUTURE) ×4
SUT VIC AB 2-0 CT1 TAPERPNT 27 (SUTURE) ×2 IMPLANT
SUTURE STRATFX 0 PDS 27 VIOLET (SUTURE) ×1 IMPLANT
TRAY FOLEY MTR SLVR 16FR STAT (SET/KITS/TRAYS/PACK) IMPLANT
TUBE SUCTION HIGH CAP CLEAR NV (SUCTIONS) ×2 IMPLANT
WATER STERILE IRR 1000ML POUR (IV SOLUTION) ×2 IMPLANT

## 2020-12-02 NOTE — H&P (Addendum)
ADMISSION H&P  Patient is admitted for left femoral neck fracture.   Subjective:  Chief Complaint: left hip pain, fall on Monday 11/30/20  HPI: Cynthia Frank, 85 y.o. female with a history of a fib/flutter on Eliquis, heart failure, and prior ovarian & colon cancer. She was at [facility] on vacation when she had a fall in the kitchen Monday morning 11/30/20. She does not recall feeling dizzy/lightheaded/short of breath prior to the fall. She denies other injuries. She was unable to get up, and [place] She was found to have a left hip fracture. Similarly she had a fall resulting in right hip fracture 2 years ago, and was treated by our practice with right THA. She and her husband are established with our practice in [place], and arrangements were made for her to be transferred here for orthopaedic care.  I discussed with her Husband as well who states that he is unsure if she had her Eliquis on Monday evening at the hospital.   Upon arrival at [facility], she was admitted under Dr. Aurea Graff service. Imaging this AM revealed left femoral neck fracture. Unfortunately, she has significant cardiac history and is under the care of Dr. Marigene Ehlers. She had a DCCV on 11/18/20 for atrial flutter. She does have a history of a fib/a flutter, on Eliquis, chronic CHF/noninschemic cardiomyopathy with EF of 20-25%.   Hospitalist was consulted for pre-op evaluation, and will see her today. He recommends Cardiology consult which is pending.   Patient Active Problem List   Diagnosis Date Noted   Hip fracture ([place]) 12/02/2020   Nausea 11/24/2020   S/P mitral valve clip implantation 07/23/2020   Heart failure with reduced ejection fraction ([place]) 07/18/2020   Hyponatremia 07/18/2020   Anemia 07/18/2020   Elevated LFTs 07/18/2020   Severe mitral regurgitation    Vertigo 03/27/2019   Orthostatic hypotension    Syncope and collapse    Closed right hip fracture ([place]) 12/16/2018    Hypokalemia 12/16/2018   Dysphoric mood 08/11/2017   Insomnia 04/21/2016   Arthritis [id number]   Chronic systolic CHF (congestive heart failure) ([place]) 06/03/2015   GERD (gastroesophageal reflux disease) 04/21/2015   Personal history of colon cancer    Routine general medical examination at a health care facility 03/24/2015   Nonischemic cardiomyopathy ([place])    Coronary artery disease    Hyperlipidemia    Atrial fibrillation ([place]) 12/11/2012   Cough 05/16/2012   THYROID NODULE 04/27/2007   Essential hypertension 04/27/2007   Past Medical History:  Diagnosis Date   AICD (automatic cardioverter/defibrillator) present    Arthritis    "left ankle" (07/14/2015)   Atrial fibrillation ([place])    Cataract    bilateral -not a surgical candidate   CHF (congestive heart failure) ([place])    Colon cancer ([place]) 03/2006   T3, N0   Colon polyps 12/07/2010   Coronary artery disease    nonobstructive with 30% D1   Diabetes mellitus without complication ([place])    on meds   GERD (gastroesophageal reflux disease)    OTC PRN -diet controlled   History of ovarian cancer 1985   Hx: UTI (urinary tract infection)    Hyperlipidemia    on meds   Hypertension    on meds   Internal hemorrhoid    Nonischemic cardiomyopathy ([place])    last EF assessment 40% by MUGA   Osteoporosis    Prolia once per year   Partial bowel obstruction ([place])  PVC (premature ventricular contraction)    S/P mitral valve clip implantation 07/23/2020   s/p TEER with a  MitraClip G4 NTW device positioned A2/P2 by Dr. Burt Knack   Thyroid disease    on meds    Past Surgical History:  Procedure Laterality Date   ABDOMINAL HYSTERECTOMY  1979   ANKLE CLOSED REDUCTION  10/16/2011   Procedure: CLOSED REDUCTION ANKLE;  Surgeon: Tobi Bastos, MD;  Location: [facility];  Service: Orthopedics;  Laterality: Left;   ATRIAL FIBRILLATION ABLATION N/A 08/14/2019   Procedure: ATRIAL FIBRILLATION ABLATION;  Surgeon: Constance Haw, MD;   Location: [place] [facility];  Service: Cardiovascular;  Laterality: N/A;   ATRIAL FIBRILLATION ABLATION N/A 07/15/2020   Procedure: ATRIAL FIBRILLATION ABLATION;  Surgeon: Constance Haw, MD;  Location: [place] [facility];  Service: Cardiovascular;  Laterality: N/A;   CARDIOVERSION N/A 04/20/2020   Procedure: CARDIOVERSION;  Surgeon: Larey Dresser, MD;  Location: [facility] ENDOSCOPY;  Service: Cardiovascular;  Laterality: N/A;   CARDIOVERSION N/A 11/18/2020   Procedure: CARDIOVERSION;  Surgeon: Larey Dresser, MD;  Location: [facility] ENDOSCOPY;  Service: Cardiovascular;  Laterality: N/A;   COLECTOMY  2007   for colon cancer   COLONOSCOPY N/A 03/11/2013   Procedure: COLONOSCOPY;  Surgeon: Ladene Artist, MD;  Location: [facility];  Service: Endoscopy;  Laterality: N/A;   COLONOSCOPY  2019   MS-plenvu(good)-int hems-SSA x 2   COLONOSCOPY WITH PROPOFOL N/A 04/21/2015   Procedure: COLONOSCOPY WITH PROPOFOL;  Surgeon: Ladene Artist, MD;  Location: [facility];  Service: Endoscopy;  Laterality: N/A;   COLONOSCOPY WITH PROPOFOL N/A 02/05/2018   Procedure: COLONOSCOPY WITH PROPOFOL Hemostasis Clip;  Surgeon: Ladene Artist, MD;  Location: [facility];  Service: Endoscopy;  Laterality: N/A;   EP IMPLANTABLE DEVICE N/A 07/14/2015   Procedure: BiV ICD Insertion CRT-D;  Surgeon: Evans Lance, MD;  Location: [place] [facility];  Service: Cardiovascular;  Laterality: N/A;   ESOPHAGOGASTRODUODENOSCOPY (EGD) WITH PROPOFOL N/A 04/21/2015   Procedure: ESOPHAGOGASTRODUODENOSCOPY (EGD) WITH PROPOFOL;  Surgeon: Ladene Artist, MD;  [facility];  Service: Endoscopy;  Laterality: N/A;   FRACTURE SURGERY     INSERTION OF ICD Left 07/14/2015   LAPAROTOMY  1980   "adhesions"   LAPAROTOMY  1989   laparotomy for takedown of intestinal obstruction secondary to adhesions    MITRAL VALVE REPAIR N/A 07/23/2020   Procedure: MITRAL VALVE REPAIR;  Surgeon: Sherren Mocha, MD;  Location: [place] [facility];   Service: Cardiovascular;  Laterality: N/A;   [place]   resection of right ovarian cancer   POLYPECTOMY  02/05/2018   Procedure: POLYPECTOMY;  Surgeon: Ladene Artist, MD;  [facility]: [facility];  Service: Endoscopy;;   POLYPECTOMY  2018   SSA x 2   RIGHT HEART CATH N/A 07/22/2020   Procedure: RIGHT HEART CATH;  Surgeon: Larey Dresser, MD;  Location: [place] [facility];  Service: Cardiovascular;  Laterality: N/A;   SMALL INTESTINE SURGERY  1985   TEE WITHOUT CARDIOVERSION N/A 08/14/2019   Procedure: TRANSESOPHAGEAL ECHOCARDIOGRAM (TEE);  Surgeon: Constance Haw, MD;  Location: [place] [facility];  Service: Cardiovascular;  Laterality: N/A;   TEE WITHOUT CARDIOVERSION N/A 07/15/2020   Procedure: TRANSESOPHAGEAL ECHOCARDIOGRAM (TEE);  Surgeon: Constance Haw, MD;  Location: [address] [facility];  Service: Cardiovascular;  Laterality: N/A;   TEE WITHOUT CARDIOVERSION N/A 07/23/2020   Procedure: TRANSESOPHAGEAL ECHOCARDIOGRAM (TEE);  Surgeon: Burt Knack,  Legrand Como, MD;  Location: [place] [facility];  Service: Cardiovascular;  Laterality: N/A;   TONSILLECTOMY  1944   TOTAL HIP ARTHROPLASTY Right 12/18/2018   Procedure: TOTAL HIP ARTHROPLASTY ANTERIOR APPROACH;  Surgeon: Rod Can, MD;  Location: [facility];  Service: Orthopedics;  Laterality: Right;    Current Facility-Administered Medications  Medication Dose Route Frequency Provider Last Rate Last Admin   HYDROcodone-acetaminophen (NORCO/VICODIN) 5-325 MG per tablet 1-2 tablet  1-2 tablet Oral Q6H PRN Cherlynn June B, PA       morphine 2 MG/ML injection 0.5 mg  0.5 mg Intravenous Q2H PRN Cherlynn June B, PA   0.5 mg at 12/02/20 0254   Allergies  Allergen Reactions   Clindamycin/Lincomycin Itching   Dramamine Ii [Meclizine Hcl] Other (See Comments)    Hyperactivity   Pneumococcal Vaccines Itching and Swelling    Redness    Sulfonamide Derivatives Itching and Swelling    Social History    Tobacco Use   Smoking status: Former    Packs/day: 0.50    Years: 20.00    Pack years: 10.00    Types: Cigarettes    Quit date: 04/18/1978    Years since quitting: 42.6   Smokeless tobacco: Never  Substance Use Topics   Alcohol use: Yes    Alcohol/week: 14.0 standard drinks    Types: 14 Standard drinks or equivalent per week    Comment: has wine before dinner     Family History  Problem Relation Age of Onset   Uterine cancer Mother    Prostate cancer Father    Heart disease Father    Heart attack Father    Colon cancer Neg Hx    Colon polyps Neg Hx    Esophageal cancer Neg Hx    Rectal cancer Neg Hx    Stomach cancer Neg Hx      Review of Systems  Constitutional:  Negative for chills and fever.  Respiratory:  Negative for cough and shortness of breath.   Cardiovascular:  Negative for chest pain.  Gastrointestinal:  Negative for nausea and vomiting.  Musculoskeletal:  Positive for arthralgias.   Objective:  Physical Exam HENT:     Head: Normocephalic and atraumatic.  Pulmonary:     Effort: Pulmonary effort is normal.  Neurological:     Mental Status: She is alert.  Left Hip Exam: Skin intact. No obvious ecchymosis. ROM not assessed. Sensation intact. Distal pulses intact.    Vital signs in last 24 hours: Temp:  [97.9 F (36.6 C)-98.5 F (36.9 C)] 97.9 F (36.6 C) (08/17 0531) Pulse Rate:  [78-85] 78 (08/17 0531) Resp:  [16-20] 16 (08/17 0531) BP: (102-106)/(67-82) 106/82 (08/17 0531) SpO2:  [93 %] 93 % (08/17 0531) Weight:  [56 kg] 56 kg (08/17 0025)  Labs:   Estimated body mass index is 21.19 kg/m as calculated from the following:   Height as of this encounter: '5\' 4"'$  (1.626 m).   Weight as of this encounter: 56 kg.   Imaging Review Plain radiographs, AP pelvis demonstrate left femoral neck fracture. Evidence of previous right total hip arthroplasty in good positioning.      Assessment/Plan:   Left femoral neck fracture History of HF with  reduced EF, a fib/ a flutter on Eliquis   Plan:  Dr. Alvan Dame and I discussed with the patient the diagnosis of left femoral neck fracture and planned treatment of left total hip arthroplasty. She had a very similar experience in 2020, when she had a fall resulting  in right femoral neck fracture with THA, so she is aware of the surgery and aftercare. We will plan on surgery this evening. NPO. Hold anticoagulants. Continue pain control. I discussed this plan with her husband as well, who is in agreement.   Hospitalist & Cardio eval today pre-operatively.

## 2020-12-02 NOTE — H&P (View-Only) (Signed)
Spoke with [facility] radiology department from [facility]:  Verbal reading of head CT reported no acute intracranial processes.   Griffith Citron, PA-C Orthopedic Surgery [facility] [place] [phone]

## 2020-12-02 NOTE — Anesthesia Procedure Notes (Signed)
Procedure Name: Intubation Date/Time: 12/02/2020 6:26 PM Performed by: Milford Cage, CRNA Pre-anesthesia Checklist: Patient identified, Emergency Drugs available, Suction available and Patient being monitored Patient Re-evaluated:Patient Re-evaluated prior to induction Oxygen Delivery Method: Circle system utilized Preoxygenation: Pre-oxygenation with 100% oxygen Induction Type: IV induction Ventilation: Mask ventilation without difficulty Laryngoscope Size: Mac and 3 Grade View: Grade I Tube type: Oral Tube size: 7.0 mm Number of attempts: 1 Airway Equipment and Method: Stylet Placement Confirmation: ETT inserted through vocal cords under direct vision, positive ETCO2 and breath sounds checked- equal and bilateral Secured at: 21 cm Tube secured with: Tape Dental Injury: Teeth and Oropharynx as per pre-operative assessment

## 2020-12-02 NOTE — Progress Notes (Signed)
Paged [facility] to inform that patient has arrived to the unit.

## 2020-12-02 NOTE — Consult Note (Addendum)
Cardiology Consultation:   Patient ID: Cynthia Frank MRN: [id number]; DOB: 12/25/1935  Admit date: 12/02/2020 Date of Consult: 12/02/2020  PCP:  [name], MD   [facility] Providers Cardiologist:  [name], MD  Electrophysiologist:  Constance Haw, MD  {  Patient Profile:   Cynthia Frank is a 85 y.o. female with a history of coronary calcifications noted on CT scan in 07/2019, non-ischemic cardiomyopathy/chronic systolic CHF with EF of 0000000, s/p ICD, paroxysmal atrial fibrillation/flutter s/p atrial fibrillation ablation in 07/2019 and then redo ablation in 06/2020 and then DCCV for atrial flutter on 11/18/2020, mitral regurgitation s/p MitraClip in 07/2020, hypertension, hyperlipidemia, diabetes mellitus, GERD, who is being seen 12/02/2020 for pre-op evaluation for hip fracture at the request of Dr. Alvan Dame.  History of Present Illness:   Cynthia Frank is a 85 year old female with the above history who is followed by Dr. Aundra Dubin and Dr. Curt Bears. Patient initially diagnosed with non-ischemic cardiomyopathy in 2001. EF 20-25%. Cardiac cath at this time showed mild non-obstructive CAD. Etiology felt to be secondary to Adiamycin which she received as part of her treatment for ovarian cancer. Cardiac MRI in 04/2015 showed EF of 29% with normal RV with septal lateral dyssynchrony and non-coronary pattern of LGE in the septum. She had Medronic CRT-D placed in 06/2015. She had slight improvement in EF to 30-35% after this. More recently she has had problems with atrial fibrillation/flutter. Previously on Amiodarone but this had to be stopped due to elevated LFTs. She underwent atrial fibrillation ablation in 07/2019 and then developed atrial flutter. Underwent successful DCCV in 04/2020. She stayed in normal sinus rhythm for a few weeks and then went back into atrial flutter. She then underwent redo atrial fibrillation ablation in 06/2020. TEE in 06/2020 showed LVEF of 20-25% with  moderate RV dysfunction, moderate TR, and severe MR. Therefore, underwent MitraClip placement in 07/2020. Repeat Echo in 08/2020 showed LVEF of 20-25%, moderate RV dysfunction, and only trivial MR.   Patient was last seen by Dr. Aundra Dubin on 11/09/2020 and was noted to be back in atrial flutter for the last several weeks and reported feeling short of breath with walking long distances. Medtronic device was interrogated and showed persistent atrial flutter with 49% BiV pacing. Outpatient DCCV was arranged. This was performed on 11/18/2020 and was successful.  Patient was at [facility] on vacation when she had a fall in the kitchen on Monday morning 11/30/2020. [place] was found to have a left hip fracture. Given she was established with Ortho here at [facility], she was transferred to [facility] for management.  Patient states she was walking around the counter in the kitchen on 11/30/2020 when the next thing she knew she was on the floor. She does believe she had loss of consciousness for a little but recovered quickly. No symptoms prior to this event. No chest pain, shortness of breath, palpitations, lightheadedness, dizziness. Of note, patient has had multiple episodes of syncope after standing up in the setting of orthostatic hypotension. She fractured her right hip following one of these episodes in 11/2018. No longer on Entresto due to soft BP. Patietn has chronic dyspnea one exertion but this is stable. No dyspnea with activities around her house. No orthopnea, PND, or edema. No recent fevers or illness. She had some nausea and difficult swallowing for a few days about 2 weeks ago but this has resolved. No nasal or cough. No abnormal  bleeding on the Eliquis.   Past Medical History:  Diagnosis Date   AICD (automatic cardioverter/defibrillator) present    Arthritis    "left ankle" (07/14/2015)   Atrial fibrillation (HCC)    Cataract    bilateral -not a surgical candidate    CHF (congestive heart failure) ([place])    Colon cancer ([place]) 03/2006   T3, N0   Colon polyps 12/07/2010   Coronary artery disease    nonobstructive with 30% D1   Diabetes mellitus without complication (HCC)    on meds   GERD (gastroesophageal reflux disease)    OTC PRN -diet controlled   History of ovarian cancer 1985   Hx: UTI (urinary tract infection)    Hyperlipidemia    on meds   Hypertension    on meds   Internal hemorrhoid    Nonischemic cardiomyopathy ([place])    last EF assessment 40% by MUGA   Osteoporosis    Prolia once per year   Partial bowel obstruction (HCC)    PVC (premature ventricular contraction)    S/P mitral valve clip implantation 07/23/2020   s/p TEER with a  MitraClip G4 NTW device positioned A2/P2 by Dr. Burt Knack   Thyroid disease    on meds    Past Surgical History:  Procedure Laterality Date   ABDOMINAL HYSTERECTOMY  1979   ANKLE CLOSED REDUCTION  10/16/2011   Procedure: CLOSED REDUCTION ANKLE;  Surgeon: Tobi Bastos, MD;  Location: [facility];  Service: Orthopedics;  Laterality: Left;   ATRIAL FIBRILLATION ABLATION N/A 08/14/2019   Procedure: ATRIAL FIBRILLATION ABLATION;  Surgeon: Constance Haw, MD;  Location: [place] CV LAB;  Service: Cardiovascular;  Laterality: N/A;   ATRIAL FIBRILLATION ABLATION N/A 07/15/2020   Procedure: ATRIAL FIBRILLATION ABLATION;  Surgeon: Constance Haw, MD;  Location: [place] CV LAB;  Service: Cardiovascular;  Laterality: N/A;   CARDIOVERSION N/A 04/20/2020   Procedure: CARDIOVERSION;  Surgeon: Larey Dresser, MD;  Location: [facility] ENDOSCOPY;  Service: Cardiovascular;  Laterality: N/A;   CARDIOVERSION N/A 11/18/2020   Procedure: CARDIOVERSION;  Surgeon: Larey Dresser, MD;  Location: [facility] ENDOSCOPY;  Service: Cardiovascular;  Laterality: N/A;   COLECTOMY  2007   for colon cancer   COLONOSCOPY N/A 03/11/2013   Procedure: COLONOSCOPY;  Surgeon: Ladene Artist, MD;  Location: [facility] ENDOSCOPY;  Service: Endoscopy;   Laterality: N/A;   COLONOSCOPY  2019   MS-plenvu(good)-int hems-SSA x 2   COLONOSCOPY WITH PROPOFOL N/A 04/21/2015   Procedure: COLONOSCOPY WITH PROPOFOL;  Surgeon: Ladene Artist, MD;  Location: [facility] ENDOSCOPY;  Service: Endoscopy;  Laterality: N/A;   COLONOSCOPY WITH PROPOFOL N/A 02/05/2018   Procedure: COLONOSCOPY WITH PROPOFOL Hemostasis Clip;  Surgeon: Ladene Artist, MD;  Location: [facility] ENDOSCOPY;  Service: Endoscopy;  Laterality: N/A;   EP IMPLANTABLE DEVICE N/A 07/14/2015   Procedure: BiV ICD Insertion CRT-D;  Surgeon: Evans Lance, MD;  Location: [place] CV LAB;  Service: Cardiovascular;  Laterality: N/A;   ESOPHAGOGASTRODUODENOSCOPY (EGD) WITH PROPOFOL N/A 04/21/2015   Procedure: ESOPHAGOGASTRODUODENOSCOPY (EGD) WITH PROPOFOL;  Surgeon: Ladene Artist, MD;  Location: [facility] END[facility];  Service: Endoscopy;  Laterality: N/A;   FRACTURE SURGERY     INSERTION OF ICD Left 07/14/2015   LAPAROTOMY  1980   "adhesions"   LAPAROTOMY  1989   laparotomy for takedown of intestinal obstruction secondary to adhesions    MITRAL VALVE REPAIR N/A 07/23/2020   Procedure: MITRAL VALVE REPAIR;  Surgeon: Sherren Mocha, MD;  Location: [place]  CV LAB;  Service: Cardiovascular;  Laterality: N/A;   [place]   resection of right ovarian cancer   POLYPECTOMY  02/05/2018   Procedure: POLYPECTOMY;  Surgeon: Ladene Artist, MD;  Location: [facility] ENDOSCOPY;  Service: Endoscopy;;   POLYPECTOMY  2018   SSA x 2   RIGHT HEART CATH N/A 07/22/2020   Procedure: RIGHT HEART CATH;  Surgeon: Larey Dresser, MD;  Location: [place] CV LAB;  Service: Cardiovascular;  Laterality: N/A;   SMALL INTESTINE SURGERY  1985   TEE WITHOUT CARDIOVERSION N/A 08/14/2019   Procedure: TRANSESOPHAGEAL ECHOCARDIOGRAM (TEE);  Surgeon: Constance Haw, MD;  Location: [place] CV LAB;  Service: Cardiovascular;  Laterality: N/A;   TEE WITHOUT CARDIOVERSION N/A 07/15/2020   Procedure:  TRANSESOPHAGEAL ECHOCARDIOGRAM (TEE);  Surgeon: Constance Haw, MD;  Location: [place] CV LAB;  Service: Cardiovascular;  Laterality: N/A;   TEE WITHOUT CARDIOVERSION N/A 07/23/2020   Procedure: TRANSESOPHAGEAL ECHOCARDIOGRAM (TEE);  Surgeon: Sherren Mocha, MD;  Location: [place] CV LAB;  Service: Cardiovascular;  Laterality: N/A;   TONSILLECTOMY  1944   TOTAL HIP ARTHROPLASTY Right 12/18/2018   Procedure: TOTAL HIP ARTHROPLASTY ANTERIOR APPROACH;  Surgeon: Rod Can, MD;  Location: [facility];  Service: Orthopedics;  Laterality: Right;     Home Medications:  Prior to Admission medications   Medication Sig Start Date End Date Taking? Authorizing Provider  amoxicillin (AMOXIL) 500 MG tablet Take 4 tablets (2,000 mg total) by mouth once as needed for up to 1 dose (take 1 hour before dental procedure). 10/05/20  Yes Milford, Maricela Bo, FNP  apixaban (ELIQUIS) 2.5 MG TABS tablet TAKE 1 TABLET(2.5 MG) BY MOUTH TWICE DAILY Patient taking differently: Take 2.5 mg by mouth 2 (two) times daily. 11/09/20  Yes Camnitz, Will Hassell Done, MD  atorvastatin (LIPITOR) 40 MG tablet TAKE 1 TABLET(40 MG) BY MOUTH DAILY AT 6 PM Patient taking differently: Take 40 mg by mouth daily. 12/13/19  Yes Camnitz, Will Hassell Done, MD  citalopram (CELEXA) 20 MG tablet Take 20 mg by mouth daily.   Yes [provider]  Coenzyme Q10 (COQ10) 200 MG CAPS Take 200 mg by mouth daily.   Yes [provider]  FARXIGA 10 MG TABS tablet TAKE 1 TABLET(10 MG) BY MOUTH DAILY BEFORE BREAKFAST Patient taking differently: Take 10 mg by mouth daily. 10/12/20  Yes Larey Dresser, MD  furosemide (LASIX) 40 MG tablet Take 1 tablet (40 mg total) by mouth daily. 07/25/20  Yes Clegg, Amy D, NP  ipratropium (ATROVENT) 0.03 % nasal spray Place 1 spray into both nostrils 2 (two) times daily. 04/26/20  Yes [provider]  levothyroxine (SYNTHROID) 25 MCG tablet TAKE 1 TABLET(25 MCG) BY MOUTH DAILY BEFORE BREAKFAST Patient  taking differently: Take 25 mcg by mouth daily before breakfast. 10/28/20  Yes Larey Dresser, MD  Magnesium Oxide 500 MG CAPS Take 1,000 mg by mouth 2 (two) times daily.    Yes [provider]  moxifloxacin (VIGAMOX) 0.5 % ophthalmic solution Place 1 drop into the left eye 3 (three) times daily.   Yes [provider]  ondansetron (ZOFRAN) 4 MG tablet Take 1 tablet (4 mg total) by mouth every 8 (eight) hours as needed for nausea or vomiting. 11/24/20  Yes [name], MD  potassium chloride SA (KLOR-CON) 20 MEQ tablet Take 1 tablet (20 mEq total) by mouth daily. 11/12/20  Yes Larey Dresser, MD  prednisoLONE acetate (PRED FORTE)  1 % ophthalmic suspension Place 1 drop into the left eye 3 (three) times daily.   Yes [provider]  spironolactone (ALDACTONE) 25 MG tablet TAKE 1 TABLET(25 MG) BY MOUTH AT BEDTIME Patient taking differently: Take 25 mg by mouth daily. 07/14/20  Yes Larey Dresser, MD  zolpidem (AMBIEN) 5 MG tablet Take 1 tablet (5 mg total) by mouth at bedtime as needed for sleep. 08/05/20  Yes [name], MD  mupirocin ointment (BACTROBAN) 2 % Place 1 application into the nose 2 (two) times daily. Patient not taking: Reported on 12/02/2020 07/27/20   Darrick Grinder D, NP    Inpatient Medications: Scheduled Meds:  chlorhexidine  60 mL Topical Once   povidone-iodine  2 application Topical Once   Continuous Infusions:   ceFAZolin (ANCEF) IV     tranexamic acid     PRN Meds: HYDROcodone-acetaminophen, morphine injection  Allergies:    Allergies  Allergen Reactions   Clindamycin/Lincomycin Itching   Dramamine Ii [Meclizine Hcl] Other (See Comments)    Hyperactivity   Pneumococcal Vaccines Itching and Swelling    Redness    Sulfonamide Derivatives Itching and Swelling    Social History:   Social History   Socioeconomic History   Marital status: Married    Spouse name: Not on file   Number of children: 2   Years of education:  Not on file   Highest education level: Not on file  Occupational History   Occupation: retired  Tobacco Use   Smoking status: Former    Packs/day: 0.50    Years: 20.00    Pack years: 10.00    Types: Cigarettes    Quit date: 04/18/1978    Years since quitting: 42.6   Smokeless tobacco: Never  Vaping Use   Vaping Use: Never used  Substance and Sexual Activity   Alcohol use: Yes    Alcohol/week: 14.0 standard drinks    Types: 14 Standard drinks or equivalent per week    Comment: has wine before dinner    Drug use: No   Sexual activity: Not Currently  Other Topics Concern   Not on file  Social History Narrative   Patient had never smoked.   Alcohol use- yes   Daily caffeine use- coffee   Illicit drug use- no   Occupation:retired    Social Determinants of Radio broadcast assistant Strain: Low Risk    Difficulty of Paying Living Expenses: Not hard at all  Food Insecurity: No Food Insecurity   Worried About Charity fundraiser in the Last Year: Never true   Ran Out of Food in the Last Year: Never true  Transportation Needs: No Transportation Needs   Lack of Transportation (Medical): No   Lack of Transportation (Non-Medical): No  Physical Activity: Not on file  Stress: Not on file  Social Connections: Not on file  Intimate Partner Violence: Not on file    Family History:   Family History  Problem Relation Age of Onset   Uterine cancer Mother    Prostate cancer Father    Heart disease Father    Heart attack Father    Colon cancer Neg Hx    Colon polyps Neg Hx    Esophageal cancer Neg Hx    Rectal cancer Neg Hx    Stomach cancer Neg Hx      ROS:  Please see the history of present illness.  Review of Systems  Constitutional:  Negative for chills and fever.  HENT:  Negative for congestion.   Respiratory:  Positive for shortness of breath. Negative for cough.   Cardiovascular:  Negative for chest pain, palpitations, leg swelling and PND.  Gastrointestinal:   Positive for nausea (2 weeks ago, resolved now). Negative for blood in stool and melena.  Genitourinary:  Negative for hematuria.  Musculoskeletal:  Positive for falls.  Neurological:  Positive for loss of consciousness. Negative for dizziness.  Endo/Heme/Allergies:  Does not bruise/bleed easily.  Psychiatric/Behavioral:  Substance abuse: former tobacco abuse.   All other systems reviewed and are negative.  Physical Exam/Data:   Vitals:   12/02/20 0023 12/02/20 0025 12/02/20 0531 12/02/20 1020  BP: 102/67  106/82 119/82  Pulse: 85  78 80  Resp: '20  16 17  '$ Temp: 98.5 F (36.9 C)  97.9 F (36.6 C) 97.6 F (36.4 C)  TempSrc: Oral  Oral   SpO2: 93%  93% 97%  Weight:  56 kg    Height:  '5\' 4"'$  (1.626 m)      Intake/Output Summary (Last 24 hours) at 12/02/2020 1117 Last data filed at 12/02/2020 0232 Gross per 24 hour  Intake --  Output 500 ml  Net -500 ml   Last 3 Weights 12/02/2020 11/24/2020 11/18/2020  Weight (lbs) 123 lb 7.3 oz 121 lb 3.2 oz 115 lb  Weight (kg) 56 kg 54.976 kg 52.164 kg     Body mass index is 21.19 kg/m.   Physical Exam per MD:  General: 85 y.o. female resting comfortably in no acute distress. HEENT: Normocephalic and atraumatic. Sclera clear.  Neck: Supple. No JVD. Heart: RRR. Distinct S1 and S2. Soft I/VI systolic murmur.  Lungs: No increased work of breathing. Clear to ausculation bilaterally. No wheezes, rhonchi, or rales.  Abdomen: Soft, non-distended, and non-tender to palpation. Extremities: No lower extremity edema.    Skin: Warm and dry. Neuro: Alert and oriented x3. No focal deficits. Psych: Normal affect. Responds appropriately.  EKG:  The EKG was personally reviewed and demonstrates: Normal sinus rhythm vs ectopic atrial rhythm with PVCs and non-specific ST/T changes. Telemetry:  Not on telemetry.  Relevant CV Studies:  Right Heart Catheterization 07/22/2020: 1. Low to normal filling pressures.  2. Preserved cardiac output.    Filling  pressures optimized after aggressive diuresis.  Will stop IV Lasix today and will give a small amt of NS with RA pressure 1.  _______________  Echocardiogram 08/27/2020: Impressions: 1. Left ventricular ejection fraction, by estimation, is 20 to 25%. The  left ventricle has severely decreased function. The left ventricle  demonstrates global hypokinesis. The left ventricular internal cavity size  was mildly dilated. Left ventricular  diastolic parameters are indeterminate.   2. Right ventricular systolic function is moderately reduced. The right  ventricular size is normal. There is mildly elevated pulmonary artery  systolic pressure. The estimated right ventricular systolic pressure is  [id number] mmHg.   3. Left atrial size was mildly dilated.   4. There is a Mitra-Clip present in the A2-P2 position. Trivial mitral  valve regurgitation. No evidence of mitral stenosis. MG 65mHg at HR 111  bpm.   5. The aortic valve is tricuspid. Aortic valve regurgitation is mild.  Mild aortic valve sclerosis is present, with no evidence of aortic valve  stenosis.   6. The inferior vena cava is normal in size with greater than 50%  respiratory variability, suggesting right atrial pressure of 3 mmHg.   7. Evidence of atrial level shunting detected by color flow Doppler.  There is a small  atrial septal defect with predominantly left to right  shunting across the atrial septum.   Laboratory Data:  High Sensitivity Troponin:  No results for input(s): TROPONINIHS in the last 720 hours.   Chemistry Recent Labs  Lab 12/02/20 0324  NA 131*  K 4.7  CL 101  CO2 22  GLUCOSE 118*  BUN 19  CREATININE 0.86  CALCIUM 8.5*  GFRNONAA >60  ANIONGAP 8    Recent Labs  Lab 12/02/20 0324  PROT 6.6  ALBUMIN 3.5  AST 29  ALT 43  ALKPHOS 66  BILITOT 2.4*   Hematology Recent Labs  Lab 12/02/20 0324  WBC 6.2  RBC 3.94  HGB 12.2  HCT 37.3  MCV 94.7  MCH 31.0  MCHC 32.7  RDW 14.5  PLT 132*   BNPNo  results for input(s): BNP, PROBNP in the last 168 hours.  DDimer No results for input(s): DDIMER in the last 168 hours.   Radiology/Studies:  DG Pelvis Portable  Result Date: 12/02/2020 CLINICAL DATA:  Left hip pain EXAM: PORTABLE PELVIS 1-2 VIEWS COMPARISON:  Pelvis radiographs 12/18/2018 FINDINGS: There is a mildly displaced fracture through the left femoral neck with proximal and mild lateral displacement of the distal fragment. The femoral head remains seated within the acetabulum. Right hip arthroplasty hardware appears intact. The SI joints and symphysis pubis are intact. The soft tissues are unremarkable. IMPRESSION: Mildly displaced fracture of the left femoral neck as above. Electronically Signed   By: Valetta Mole M.D.   On: 12/02/2020 10:33     Assessment and Plan:   Pre-Op Evaluation - Patient had a fall on 11/30/2020 and fractured her left hip. - Stable from a cardiac standpoint. No angina or CHF. Sounds like she may have passed out but this may have been due to hypotension. - Per Revised Cardiac Risk Index, considered low risk with 0.9% chance of adverse cardiac events. However, given degree of cardiomyopathy, advanced age, and other comorbidities, risk is likely higher. In addition, patient underwent DCCV on 11/18/2020 for atrial flutter. Ideally, would wait 4 weeks after DCCV before stopping for any procedure due to risk of cardiac thrombus and stroke. However, given increased in morbidity/mortality with untreated hip fracture, OK to stop and proceed with surgery. However, recommend Eliquis be restarted as soon as possible postoperatively (hopefully tomorrow morning). Would be judicious with IV fluids given CHF.  Non-Ischemic Cardiomyopathy Chronic Systolic CHF S/p Medtronic ICD - Echo in 08/2020 showed LVEF of 20-25% with global hypokinesis, moderate RV dysfunction, mild AI, and trial MR with MitraClip present. - Appears euvolemic on exam. - Home Lasix '40mg'$  daily and Spironolactone  '25mg'$  daily currently on hold. Consider restarting after surgery if BP will allow. - Has not been able to tolerate Toprol due to excessive fatigue. No longer on Entresto given syncope and lightheadedness while taking. - Continue home Farxiga '10mg'$  daily. - Monitor volume status closely perioperatively.   Paroxysmal Atrial Fibrillation/Flutter - S/p atrial fibrillation ablation in 07/2019 and 06/2020. S/p DCCV on 11/18/2020 for atrial flutter. Looks to be maintaining sinus rhythm. - Not on any AV nodal agents.  - On chronic anticoagulation with Eliquis. Last dose evening of 11/30/2020. Ok to continue to hold for hip surgery later this evening. Recommended restarting as soon as able following surgery (hopefully tomorrow morning).  Non-Obstructive CAD - Non-obstructive CAD noted on cath in 2001. Coronary calcifications seen on CT in 07/2019.  - No angina.  - No aspirin given she is on Eliquis.  - Continue  Lipitor '40mg'$  daily.  Possible Syncope - Possibly syncope that led to fall. Patient does have a history of syncope secondary to hypotension/orthostatic hypotension but this is usually preceded by lightheadedness. Denies any prodromal symptoms prior to this event. - Recent Echo in 08/2020 as above. - BP does run soft. Unclear what patient's BP was at the time of the events. Unable to see records from hospital at [facility].  - May need to have device interrogated after surgery.  Hyperlipidemia - Continue home Lipitor '40mg'$  daily.  Otherwise, per primary team: - Hip fracture - Type 2 diabetes  - Hypothyroidism - IBS - GERD     Risk Assessment/Risk Scores:    New York Heart Association (NYHA) Functional Class NYHA Class II-III  CHA2DS2-VASc Score = 7  This indicates a 11.2% annual risk of stroke. The patient's score is based upon: CHF History: Yes HTN History: Yes Diabetes History: Yes Stroke History: No Vascular Disease History: Yes Age Score: 2 Gender Score: 1     For  questions or updates, please contact [place] Please consult [email or web address] for contact info under    Signed, Darreld Mclean, PA-C  12/02/2020 11:17 AM  Personally seen and examined. Agree with above.  85 year old female patient of Dr. Claris Gladden with nonischemic cardiomyopathy EF in the 25% range with ICD in place with multiple falls, prior right hip work done, recent left hip fracture while at [facility].  When asked about falls, she does state that she may lose consciousness for 1 second.  She reports no defibrillation firing.  She also underwent mitral clip procedure.  Recently underwent cardioversion on 11/18/2020.  Has been on chronic anticoagulation consistently until Monday the day of her fall where it was stopped at [facility].  Lovenox injection was utilized.  She reports no shortness of breath currently, no chest pain.  Here with her daughter.  Very pleasant, ICD in place, 1/6 systolic murmur heard apex.  Assessment and plan:  Cardiac risk assessment - She may proceed with surgery for her left hip fracture.  She does have high overall cardiac risk based upon her reduced ejection fraction, advanced age.  However there are no compelling reasons not to proceed with surgery.  Monitor closely.  She and her daughter both understand potential risks.  Persistent atrial fibrillation - Recent cardioversion.  Optimally would like to be on full anticoagulation for at least 4 weeks before proceeding with any elective procedures.  Her hip surgery is not elective given her recent fracture and must be done on a fairly urgent basis to prevent further morbidity and mortality.  She understands that there is a slight risk of increased embolic stroke in this setting.  Willing to proceed.  Return to Eliquis as soon as possible after surgery.  CAD - Nonobstructive remotely on coronary catheterization.  No aspirin since she is on Eliquis.  Hyperlipidemia - Continue with atorvastatin 40 mg a  day  Paroxysmal atrial relation flutter - Currently maintaining sinus rhythm.  Possible syncope - This may have led to her fall.  Is possible that her blood pressures may be labile.  She really does deny any prodromal symptoms prior to the falls.  Thankfully she does have a defibrillator in place that can show any adverse arrhythmias.  Once again she did not have any shocks involved.  Most recent interrogation showed no VT or VF episodes.  Report from July 2022 reviewed.  Candee Furbish, MD

## 2020-12-02 NOTE — Progress Notes (Signed)
Initial Nutrition Assessment RD working remotely.  DOCUMENTATION CODES:   Not applicable  INTERVENTION:  - will order Ensure Surgery BID, each supplement provides 330 kcal and 18 grams of protein - will order 1 tablet multivitamin with minerals/day. - diet advancement as medically feasible. - complete NFPE when feasible.   NUTRITION DIAGNOSIS:   Increased nutrient needs related to hip fracture as evidenced by estimated needs.  GOAL:   Patient will meet greater than or equal to 90% of their needs  MONITOR:   Diet advancement, PO intake, Supplement acceptance, Labs, Weight trends, Skin  REASON FOR ASSESSMENT:   Consult Hip fracture protocol  ASSESSMENT:   85 y.o. female with medical history of afib/aflutter, multiple cardioversions-most recent DCCV on 11/18/20, redo ablation, mitral regurgitation s/p mitral clip, non-ischemic cardiomyopathy, CHF with s/p AICD, HTN, HLD, type 2 DM, GERD, hard of hearing, right THA 12/18/18, and recurrent episodes of syncope felt to be due to orthostatic hypotension. She was admitted for L hip fx following a syncopal episode that resulted in a fall.  She has been NPO since admission.   Weight today is 123 lb and eight has been stable/currently slightly up since 07/24/20. Non-pitting edema to BLE documented in the edema section of flow sheet.   Per Ortho note this AM, plan for L total hip arthroplasty for fixation of L femoral neck fx. Plan for surgery this evening.    Labs reviewed; Na: 132 mmol/l, Ca: 8.5 mg/dl. Medications reviewed; sliding scale novolog.    NUTRITION - FOCUSED PHYSICAL EXAM:  Unable to complete at this time.   Diet Order:   Diet Order             Diet NPO time specified Except for: Sips with Meds  Diet effective midnight           Diet NPO time specified  Diet effective now                   EDUCATION NEEDS:   No education needs have been identified at this time  Skin:  Skin Assessment: Reviewed RN  Assessment  Last BM:  8/16 (day PTA)  Height:   Ht Readings from Last 1 Encounters:  12/02/20 '5\' 4"'$  (1.626 m)    Weight:   Wt Readings from Last 1 Encounters:  12/02/20 56 kg    Estimated Nutritional Needs:  Kcal:  1680-1850 kcal Protein:  85-100 grams Fluid:  >/= 1.8 L/day      Jarome Matin, MS, RD, LDN, CNSC Inpatient Clinical Dietitian RD pager # available in [place]  After hours/weekend pager # available in [facility]

## 2020-12-02 NOTE — Transfer of Care (Signed)
Immediate Anesthesia Transfer of Care Note  Patient: Cynthia Frank  Procedure(s) Performed: TOTAL HIP ARTHROPLASTY ANTERIOR APPROACH (Left: Hip)  Patient Location: PACU  Anesthesia Type:General  Level of Consciousness: sedated  Airway & Oxygen Therapy: Patient Spontanous Breathing and Patient connected to face mask oxygen  Post-op Assessment: Report given to RN and Post -op Vital signs reviewed and stable  Post vital signs: Reviewed and stable  Last Vitals:  Vitals Value Taken Time  BP 105/78 12/02/20 2045  Temp 36.7 C 12/02/20 2007  Pulse 87 12/02/20 2045  Resp 15 12/02/20 2045  SpO2 98 % 12/02/20 2045    Last Pain:  Vitals:   12/02/20 2045  TempSrc:   PainSc: 0-No pain      Patients Stated Pain Goal: 2 ([phone] [id number])  Complications: No notable events documented.

## 2020-12-02 NOTE — Anesthesia Procedure Notes (Addendum)
Arterial Line Insertion Start/End8/17/2022 5:40 PM, 12/02/2020 5:49 PM Performed by: Suzette Battiest, MD, anesthesiologist  Preanesthetic checklist: patient identified, IV checked, site marked, risks and benefits discussed, surgical consent, monitors and equipment checked, pre-op evaluation, timeout performed and anesthesia consent Lidocaine 1% used for infiltration Right, brachial was placed Catheter size: 20 G Maximum sterile barriers used  and Seldinger technique used  Attempts: 1 Procedure performed using ultrasound guided technique. Ultrasound Notes:anatomy identified, needle tip was noted to be adjacent to the nerve/plexus identified, no ultrasound evidence of intravascular and/or intraneural injection and image(s) printed for medical record Following insertion, dressing applied and Biopatch. Post procedure assessment: normal  Patient tolerated the procedure well with no immediate complications. Additional procedure comments: Multiple attempts at left radial unsuccessful. Non-palpable right radial so no attempts made. Successful u/s guided right brachial artery a-line placed. Pt tolerated well.Marland Kitchen

## 2020-12-02 NOTE — Progress Notes (Signed)
Spoke with [facility] radiology department from [facility]:  Verbal reading of head CT reported no acute intracranial processes.   Griffith Citron, PA-C Orthopedic Surgery [facility] [place] [phone]

## 2020-12-02 NOTE — Anesthesia Procedure Notes (Signed)
Date/Time: 12/02/2020 8:00 PM Performed by: Cynda Familia, CRNA Oxygen Delivery Method: Simple face mask Placement Confirmation: breath sounds checked- equal and bilateral and positive ETCO2 Dental Injury: Teeth and Oropharynx as per pre-operative assessment

## 2020-12-02 NOTE — Op Note (Signed)
NAME:  Cynthia Frank NO.: [id number]      MEDICAL RECORD NO.: [id number]      FACILITY:  [facility]      PHYSICIAN:  [name]  DATE OF BIRTH:  1935-06-13     DATE OF PROCEDURE:  12/02/2020                                 OPERATIVE REPORT         PREOPERATIVE DIAGNOSIS: Left displaced femoral neck fracture.      POSTOPERATIVE DIAGNOSIS:  Left displaced femoral neck fracture.      PROCEDURE:  Left total hip replacement through an anterior approach   utilizing DePuy THR system, component size 52 mm pinnacle cup, a size 36+4 neutral   Altrex liner, a size 4 standard Actis stem with a 36+8.5 Articuleze metal head ball  ball.      SURGEON:  Pietro Cassis. Alvan Dame, M.D.      ASSISTANT:  Costella Hatcher, PA-C     ANESTHESIA:  General.      SPECIMENS:  None.      COMPLICATIONS:  None.      BLOOD LOSS:  1000 cc     DRAINS:  None.      INDICATION OF THE PROCEDURE:  FAVOR MASSOTH is a 85 y.o. female who had a ground level fall while in [facility].  She was found to have a displaced left femoral neck fracture.  She requested to be transferred to [place] for management.   She was directly admitted for management.  We reviewed the fracture pattern and the similarities to her right hip fracture that necessitated total hip replacement.  We reviewed the indications and indications for hip replacement in setting of this acute injury and past history of hip osteoarthritis. Consent was obtained for pain management of her femoral neck fracture.who had   presented to office for evaluation of left hip pain.  Specific risks of infection, DVT, component failure, dislocation, neurovascular injury, and need for revision surgery were reviewed.     PROCEDURE IN DETAIL:  The patient was brought to operative theater.   Once adequate anesthesia, preoperative antibiotics, 2 gm of Ancef, 1 gm of Tranexamic Acid, and 10 mg of Decadron were administered, the  patient was positioned supine on the [employer] table.  Once the patient was safely positioned with adequate padding of boney prominences we predraped out the hip, and used fluoroscopy to confirm orientation of the pelvis.      The left hip was then prepped and draped from proximal iliac crest to   mid thigh with a shower curtain technique.      Time-out was performed identifying the patient, planned procedure, and the appropriate extremity.     An incision was then made 2 cm lateral to the   anterior superior iliac spine extending over the orientation of the   tensor fascia lata muscle and sharp dissection was carried down to the   fascia of the muscle.      The fascia was then incised.  The muscle belly was identified and swept   laterally and retractor placed along the superior neck.  Following   cauterization of the circumflex vessels and removing some pericapsular   fat, a second cobra retractor was placed on  the inferior neck.  A T-capsulotomy was made along the line of the   superior neck to the trochanteric fossa, then extended proximally and   distally.  Tag sutures were placed and the retractors were then placed   intracapsular.  The fractured femoral neck was identified.  First I identified the trochanteric fossa and   orientation of my neck cut and then made a neck osteotomy with the femur on traction.  The fractured neck segment and then the femoral   head were removed without difficulty or complication.  Traction was let   off and retractors were placed posterior and anterior around the   acetabulum.     Attention was now directed at acetabular preparation.  The labrum and foveal tissue were debrided.  I began reaming with a 44 mm   reamer and reamed up to 51 mm reamer with good bony bed preparation and a 52 mm  cup was chosen.  The final 52 mm Pinnacle cup was then impacted under fluoroscopy to confirm the depth of penetration and orientation with respect to   Abduction and  forward flexion.  A screw was placed into the ilium followed by the hole eliminator.  The final   36+4 neutral Altrex liner was impacted with good visualized rim fit.  The cup was positioned anatomically within the acetabular portion of the pelvis.      At this point, the femur was rolled to 100 degrees.  Further capsule was   released off the inferior aspect of the femoral neck.  I then   released the superior capsule proximally.  With the leg in a neutral position the hook was placed laterally   along the femur under the vastus lateralis origin and elevated manually and then held in position using the hook attachment on the bed.  The leg was then extended and adducted with the leg rolled to 100   degrees of external rotation.  Retractors were placed along the medial calcar and posteriorly over the greater trochanter.  Once the proximal femur was fully   exposed, I used a box osteotome to set orientation.  I then began   broaching with the starting chili pepper broach and passed this by hand and then broached up to 4.  With the 4 broach in place I chose a standard offset neck and did several trial reductions.  The offset was appropriate, leg lengths   appeared to be equal best matched with the +8.5 vs the +5 head ball trial confirmed radiographically.   Given these findings, I went ahead and dislocated the hip, repositioned all   retractors and positioned the right hip in the extended and abducted position.  The final 4 standard Actis stem was   chosen and it was impacted down to the level of neck cut.  Based on this   and the trial reductions, a final 36+8.5 Articuleze metal head ball was chosen and   impacted onto a clean and dry trunnion, and the hip was reduced.  The   hip had been irrigated throughout the case again at this point.  I did   reapproximate the superior capsular leaflet to the anterior leaflet   using #1 Vicryl.  The fascia of the   tensor fascia lata muscle was then  reapproximated using #1 Vicryl and #0 Stratafix sutures.  The   remaining wound was closed with 2-0 Vicryl and running 4-0 Monocryl.   The hip was cleaned, dried, and dressed sterilely using Dermabond and  Aquacel dressing.  The patient was then brought   to recovery room in stable condition tolerating the procedure well.    Costella Hatcher, PA-C was present for the entirety of the case involved from   preoperative positioning, perioperative retractor management, general   facilitation of the case, as well as primary wound closure as assistant.            Pietro Cassis Alvan Dame, M.D.        12/02/2020 6:10 PM

## 2020-12-02 NOTE — Anesthesia Preprocedure Evaluation (Signed)
Anesthesia Evaluation  Patient identified by MRN, date of birth, ID band Patient awake    Reviewed: Allergy & Precautions, NPO status , Patient's Chart, lab work & pertinent test results  Airway Mallampati: II  TM Distance: >3 FB Neck ROM: Full    Dental  (+) Dental Advisory Given   Pulmonary former smoker,    breath sounds clear to auscultation       Cardiovascular hypertension, Pt. on medications + CAD and +CHF  + dysrhythmias Atrial Fibrillation + Cardiac Defibrillator + Valvular Problems/Murmurs  Rhythm:Regular Rate:Normal     Neuro/Psych negative neurological ROS     GI/Hepatic Neg liver ROS, GERD  ,  Endo/Other  diabetes  Renal/GU negative Renal ROS     Musculoskeletal   Abdominal   Peds  Hematology   Anesthesia Other Findings   Reproductive/Obstetrics                             Lab Results  Component Value Date   WBC 5.6 12/02/2020   HGB 12.1 12/02/2020   HCT 37.1 12/02/2020   MCV 96.6 12/02/2020   PLT 132 (L) 12/02/2020   Lab Results  Component Value Date   CREATININE 0.93 12/02/2020   BUN 21 12/02/2020   NA 132 (L) 12/02/2020   K 4.5 12/02/2020   CL 99 12/02/2020   CO2 23 12/02/2020    Anesthesia Physical Anesthesia Plan  ASA: 4 and emergent  Anesthesia Plan: General   Post-op Pain Management:    Induction: Intravenous  PONV Risk Score and Plan: 3 and Dexamethasone, Ondansetron and Treatment may vary due to age or medical condition  Airway Management Planned: Oral ETT  Additional Equipment: Arterial line  Intra-op Plan:   Post-operative Plan: Extubation in OR  Informed Consent: I have reviewed the patients History and Physical, chart, labs and discussed the procedure including the risks, benefits and alternatives for the proposed anesthesia with the patient or authorized representative who has indicated his/her understanding and acceptance.      Dental advisory given  Plan Discussed with:   Anesthesia Plan Comments:         Anesthesia Quick Evaluation

## 2020-12-02 NOTE — Consult Note (Addendum)
Medical Consultation   Cynthia Frank  [id number]  DOB: Jul 17, 1935  DOA: 12/02/2020  PCP: [name], MD Outpatient Specialists:    Requesting physician: Dr. Paralee Cancel, orthopedics  Reason for consultation: Left hip fracture, planned for surgical fixation this evening, assist with evaluation and management of medical problems.   History of Present Illness: Cynthia Frank is an 85 y.o. female, married, independent, PMH of paroxysmal A. fib/flutter, multiple cardioversions-most recent DCCV on 11/18/2020, redo ablation, mitral regurgitation s/p mitral clip, nonischemic cardiomyopathy, chronic systolic CHF with most recent LVEF 20-25%, s/p AICD, hypertension, hyperlipidemia, type II DM, GERD, hard of hearing, right THA 12/18/2018, recurrent episodes of syncope felt to be due to orthostatic hypotension, admitted for left hip fracture following syncope and fall.  [place] 2 other couples.  She was in her usual state of health.  She does have chronic dyspnea on exertion after climbing up about 8 steps or walking half a block.  She may have been in the kitchen, trying to rearrange stuff when she had a brief syncopal episode and fell, hit her head.  This was on 11/30/2020.  She immediately had left hip pain and was unable to get up.  She was taken to the local hospital and noted to have a left hip fracture.  Patient declined several of the investigation or further management and wanted transferred to [facility] because all her medical care is in [place].  She was thereby transferred to [facility], orthopedics admitted her and [facility] was consulted to assist with medical management.  I discussed with Costella Hatcher, PA with orthopedics this morning and advised preop cardiac clearance given her extensive cardiac history.  Cardiology input appreciated.    Review of Systems:  ROS All other  systems reviewed and apart from HPI, all others are negative.  She has some left hip pain due to hip fracture, mostly on movement.  Feels thirsty and wants some fluids but advised her that she is n.p.o. for upcoming surgery at around 530-6 PM this evening.  She can have a few ice chips or sips but not a lot of oral fluids.  She states that she just feels "mad" that this happened.  Reports chronic dyspnea as noted above but no chest pain, palpitations, overt bleeding or any complaints at this time.  No headache or strokelike symptoms.  Past Medical History: Past Medical History:  Diagnosis Date   AICD (automatic cardioverter/defibrillator) present    Arthritis    "left ankle" (07/14/2015)   Atrial fibrillation (HCC)    Cataract    bilateral -not a surgical candidate   CHF (congestive heart failure) ([place])    Colon cancer ([place]) 03/2006   T3, N0   Colon polyps 12/07/2010   Coronary artery disease    nonobstructive with 30% D1   Diabetes mellitus without complication ([place])    on meds   GERD (gastroesophageal reflux disease)    OTC PRN -diet controlled   History of ovarian cancer 1985   Hx: UTI (urinary tract infection)    Hyperlipidemia    on meds   Hypertension    on meds   Internal hemorrhoid    Nonischemic cardiomyopathy ([place])    last EF assessment 40% by MUGA   Osteoporosis    Prolia once per year   Partial bowel obstruction ([place])  PVC (premature ventricular contraction)    S/P mitral valve clip implantation 07/23/2020   s/p TEER with a  MitraClip G4 NTW device positioned A2/P2 by Dr. Burt Knack   Thyroid disease    on meds    Past Surgical History: Past Surgical History:  Procedure Laterality Date   ABDOMINAL HYSTERECTOMY  1979   ANKLE CLOSED REDUCTION  10/16/2011   Procedure: CLOSED REDUCTION ANKLE;  Surgeon: Tobi Bastos, MD;  Location: [facility];  Service: Orthopedics;  Laterality: Left;   ATRIAL FIBRILLATION ABLATION N/A 08/14/2019   Procedure: ATRIAL FIBRILLATION  ABLATION;  Surgeon: Constance Haw, MD;  Location: [place] [facility];  Service: Cardiovascular;  Laterality: N/A;   ATRIAL FIBRILLATION ABLATION N/A 07/15/2020   Procedure: ATRIAL FIBRILLATION ABLATION;  Surgeon: Constance Haw, MD;  Location: [place] [facility];  Service: Cardiovascular;  Laterality: N/A;   CARDIOVERSION N/A 04/20/2020   Procedure: CARDIOVERSION;  Surgeon: Larey Dresser, MD;  Location: [facility] ENDOSCOPY;  Service: Cardiovascular;  Laterality: N/A;   CARDIOVERSION N/A 11/18/2020   Procedure: CARDIOVERSION;  Surgeon: Larey Dresser, MD;  Location: [facility] ENDOSCOPY;  Service: Cardiovascular;  Laterality: N/A;   COLECTOMY  2007   for colon cancer   COLONOSCOPY N/A 03/11/2013   Procedure: COLONOSCOPY;  Surgeon: Ladene Artist, MD;  Location: [facility];  Service: Endoscopy;  Laterality: N/A;   COLONOSCOPY  2019   MS-plenvu(good)-int hems-SSA x 2   COLONOSCOPY WITH PROPOFOL N/A 04/21/2015   Procedure: COLONOSCOPY WITH PROPOFOL;  Surgeon: Ladene Artist, MD;  Location: [facility];  Service: Endoscopy;  Laterality: N/A;   COLONOSCOPY WITH PROPOFOL N/A 02/05/2018   Procedure: COLONOSCOPY WITH PROPOFOL Hemostasis Clip;  Surgeon: Ladene Artist, MD;  Location: [facility];  Service: Endoscopy;  Laterality: N/A;   EP IMPLANTABLE DEVICE N/A 07/14/2015   Procedure: BiV ICD Insertion CRT-D;  Surgeon: Evans Lance, MD;  Location: [place] [facility];  Service: Cardiovascular;  Laterality: N/A;   ESOPHAGOGASTRODUODENOSCOPY (EGD) WITH PROPOFOL N/A 04/21/2015   Procedure: ESOPHAGOGASTRODUODENOSCOPY (EGD) WITH PROPOFOL;  Surgeon: Ladene Artist, MD;  [facility];  Service: Endoscopy;  Laterality: N/A;   FRACTURE SURGERY     INSERTION OF ICD Left 07/14/2015   LAPAROTOMY  1980   "adhesions"   LAPAROTOMY  1989   laparotomy for takedown of intestinal obstruction secondary to adhesions    MITRAL VALVE REPAIR N/A 07/23/2020   Procedure: MITRAL VALVE REPAIR;  Surgeon: Sherren Mocha, MD;  Location: [place] [facility];  Service: Cardiovascular;  Laterality: N/A;   [place]   resection of right ovarian cancer   POLYPECTOMY  02/05/2018   Procedure: POLYPECTOMY;  Surgeon: Ladene Artist, MD;  Location: [facility];  Service: Endoscopy;;   POLYPECTOMY  2018   SSA x 2   RIGHT HEART CATH N/A 07/22/2020   Procedure: RIGHT HEART CATH;  Surgeon: Larey Dresser, MD;  Location: [place] [facility];  Service: Cardiovascular;  Laterality: N/A;   SMALL INTESTINE SURGERY  1985   TEE WITHOUT CARDIOVERSION N/A 08/14/2019   Procedure: TRANSESOPHAGEAL ECHOCARDIOGRAM (TEE);  Surgeon: Constance Haw, MD;  Location: [place] [facility];  Service: Cardiovascular;  Laterality: N/A;   TEE WITHOUT CARDIOVERSION N/A 07/15/2020   Procedure: TRANSESOPHAGEAL ECHOCARDIOGRAM (TEE);  Surgeon: Constance Haw, MD;  Location: [place] [facility];  Service: Cardiovascular;  Laterality: N/A;   TEE WITHOUT CARDIOVERSION N/A 07/23/2020   Procedure: TRANSESOPHAGEAL ECHOCARDIOGRAM (TEE);  Surgeon: Sherren Mocha, MD;  Location: [place] [facility];  Service: Cardiovascular;  Laterality: N/A;   TONSILLECTOMY  1944   TOTAL HIP ARTHROPLASTY Right 12/18/2018   Procedure: TOTAL HIP ARTHROPLASTY ANTERIOR APPROACH;  Surgeon: Rod Can, MD;  Location: [facility];  Service: Orthopedics;  Laterality: Right;     Allergies:   Allergies  Allergen Reactions   Clindamycin/Lincomycin Itching   Dramamine Ii [Meclizine Hcl] Other (See Comments)    Hyperactivity   Pneumococcal Vaccines Itching and Swelling    Redness    Sulfonamide Derivatives Itching and Swelling     Social History:  reports that she quit smoking about 42 years ago. Her smoking use included cigarettes. She has a 10.00 pack-year smoking history. She has never used smokeless tobacco. She reports current alcohol use of about 14.0 standard drinks per week. She reports that she does not use  drugs.   Family History: Family History  Problem Relation Age of Onset   Uterine cancer Mother    Prostate cancer Father    Heart disease Father    Heart attack Father    Colon cancer Neg Hx    Colon polyps Neg Hx    Esophageal cancer Neg Hx    Rectal cancer Neg Hx    Stomach cancer Neg Hx       Physical Exam: Vitals:   12/02/20 0023 12/02/20 0025 12/02/20 0531 12/02/20 1020  BP: 102/67  106/82 119/82  Pulse: 85  78 80  Resp: '20  16 17  '$ Temp: 98.5 F (36.9 C)  97.9 F (36.6 C) 97.6 F (36.4 C)  TempSrc: Oral  Oral   SpO2: 93%  93% 97%  Weight:  56 kg    Height:  '5\' 4"'$  (1.626 m)      Constitutional: Alert and awake, oriented x3, not in any acute distress.  Pleasant elderly female, small built and frail lying comfortably supine in bed. Eyes: PERLA, EOMI, irises appear normal, anicteric sclera,  ENMT: external ears and nose appear normal, hearing normal, Lips appears normal, oropharynx mucosa, tongue, posterior pharynx appear normal.  Small area of bruising noted at the left angle of the mouth with a Steri-Strip placed at OSH. Neck: neck appears normal, no masses, normal ROM, no thyromegaly, no JVD  CVS: S1-S2 clear, RRR, no rubs or gallops, no LE edema, normal pedal pulses.  3/6 systolic murmur best heard at the apex. Respiratory:  clear to auscultation bilaterally, no wheezing, rales or rhonchi. Respiratory effort normal. No accessory muscle use.  Abdomen: soft nontender, nondistended, normal bowel sounds, no hepatosplenomegaly, no hernias  Musculoskeletal: : no cyanosis, clubbing or edema noted bilaterally. Joint/bones/muscle exam, strength, contractures or atrophy.  Left lower extremity slightly shortened and externally rotated.  Movements restricted by pain. Neuro: Cranial nerves II-XII intact, strength, sensation, reflexes Psych: judgement and insight appear normal, stable mood and affect, mental status Skin: no rashes or lesions or ulcers, no induration or nodules.   Small area of superficial bruising without laceration along the left anterior parietal area, 1.5 to 2 inches in diameter.  No swelling.   Data reviewed:  I have personally reviewed following labs and imaging studies Labs:  CBC: Recent Labs  Lab 12/02/20 0324 12/02/20 1058  WBC 6.2 5.6  NEUTROABS 4.6 4.2  HGB 12.2 12.1  HCT 37.3 37.1  MCV 94.7 96.6  PLT 132* 132*    Basic Metabolic Panel: Recent Labs  Lab 12/02/20 0324 12/02/20 1058  NA 131* 132*  K 4.7 4.5  CL 101 99  CO2 22 23  GLUCOSE 118* 119*  BUN 19 21  CREATININE 0.86 0.93  CALCIUM 8.5* 8.5*   GFR Estimated Creatinine Clearance: 40.3 mL/min (by C-G formula based on SCr of 0.93 mg/dL). Liver Function Tests: Recent Labs  Lab 12/02/20 0324 12/02/20 1058  AST 29 28  ALT 43 41  ALKPHOS 66 63  BILITOT 2.4* 2.5*  PROT 6.6 6.6  ALBUMIN 3.5 3.4*    Coagulation profile Recent Labs  Lab 12/02/20 0324 12/02/20 1058  INR 1.3* 1.4*    Microbiology Recent Results (from the past 240 hour(s))  Surgical pcr screen     Status: None   Collection Time: 12/02/20  3:00 AM   Specimen: Nasal Mucosa; Nasal Swab  Result Value Ref Range Status   MRSA, PCR NEGATIVE NEGATIVE Final   Staphylococcus aureus NEGATIVE NEGATIVE Final    Comment: (NOTE) The Xpert SA Assay (FDA approved for NASAL specimens in patients 45 years of age and older), is one component of a comprehensive surveillance program. It is not intended to diagnose infection nor to guide or monitor treatment. Performed at [facility], [place] [address] [place], [place] [address]   SARS CORONAVIRUS 2 (TAT 6-24 HRS) Nasopharyngeal Nasal Mucosa     Status: None   Collection Time: 12/02/20  3:00 AM   Specimen: Nasal Mucosa; Nasopharyngeal  Result Value Ref Range Status   SARS Coronavirus 2 NEGATIVE NEGATIVE Final    Comment: (NOTE) SARS-CoV-2 target nucleic acids are NOT DETECTED.  The SARS-CoV-2 RNA is generally detectable in upper and  lower respiratory specimens during the acute phase of infection. Negative results do not preclude SARS-CoV-2 infection, do not rule out co-infections with other pathogens, and should not be used as the sole basis for treatment or other patient management decisions. Negative results must be combined with clinical observations, patient history, and epidemiological information. The expected result is Negative.  Fact Sheet for Patients: SugarRoll.be  Fact Sheet for Healthcare Providers: https://www.woods-mathews.com/  This test is not yet approved or cleared by the [place] FDA and  has been authorized for detection and/or diagnosis of SARS-CoV-2 by FDA under an Emergency Use Authorization (EUA). This EUA will remain  in effect (meaning this test can be used) for the duration of the COVID-19 declaration under Se ction 564(b)(1) of the Act, 21 U.S.C. section 360bbb-3(b)(1), unless the authorization is terminated or revoked sooner.  Performed at [facility], [place] [address], [place], [place] [address]        Inpatient Medications:   Scheduled Meds:  atorvastatin  40 mg Oral Daily   chlorhexidine  60 mL Topical Once   povidone-iodine  2 application Topical Once   Continuous Infusions:   ceFAZolin (ANCEF) IV     tranexamic acid       Radiological Exams on Admission: DG Pelvis Portable  Result Date: 12/02/2020 CLINICAL DATA:  Left hip pain EXAM: PORTABLE PELVIS 1-2 VIEWS COMPARISON:  Pelvis radiographs 12/18/2018 FINDINGS: There is a mildly displaced fracture through the left femoral neck with proximal and mild lateral displacement of the distal fragment. The femoral head remains seated within the acetabulum. Right hip arthroplasty hardware appears intact. The SI joints and symphysis pubis are intact. The soft tissues are unremarkable. IMPRESSION: Mildly displaced fracture of the left femoral neck as above. Electronically Signed    By: Valetta Mole M.D.   On: 12/02/2020 10:33    Impression/Recommendations Active Problems:   Hip fracture ([place])  Displaced fracture of the femoral neck, sustained after syncope and mechanical fall: Management per orthopedics.  Orthopedics plan left total hip arthroplasty this evening.  Cardiology input regarding preop clearance appreciated.  They indicate that she may proceed with surgery but does have high overall cardiac risk based on her reduced EF and advanced age but there are no compelling reasons not to proceed with surgery.  I agree that the risks of delay surgery outweigh the need to temporarily hold anticoagulation for surgery.  Patient is n.p.o. for procedure.  Will place on brief and gentle fluids until surgery and then she can resume diet  Syncope and collapse/mechanical fall: This seems to be recurrent issue.  Patient and spouse via patient's speaker phone, indicate she had a CT of the head at the OSH and was told to be okay (I discussed with Ms Costella Hatcher, PA to kindly follow-up on the CT report from OSH to confirm the same).  No focal deficits.  Evaluation postprocedure.  After previous hip surgery, patient had actually gone home.  Persistent atrial fibrillation: Has had multiple DCCV's, last on 11/18/2020 and has had redo ablation in the past.  Ideally prefer not to interrupt 4 weeks of post DCCV but needs to have hip fracture fixed ASAP to prevent further morbidity and death.  Patient and daughter at bedside increased risk of embolic stroke while temporarily holding anticoagulation.  Recommend resuming Eliquis as soon as possible after surgery, even as early as tomorrow morning as per cardiology.  As per pharmacy med reconciliation, it appears that her last dose of Eliquis was on 11/30/2020 at 5:45 PM.  CAD: No anginal symptoms.  Hyperlipidemia: Statins.  Hyponatremia: Appears chronic and stable.  Patient clinically euvolemic.  Thrombocytopenia, chronic: Stable.  Follow CBC  postoperatively.  Chronic systolic CHF/nonischemic cardiomyopathy: Home dose of Lasix and Aldactone.  Clinically  Hypothyroid: Continue Synthroid.  Type II DM: Temporarily hold [place].  Monitor CBGs use SSI..  May resume Farxiga at discharge.   Thank you for this consultation.  We will follow the patient along with you.   Time Spent: 50 minutes  I discussed in detail with patient's daughter at bedside, updated care and answered questions.  Vernell Leep M.D. [facility] 12/02/2020, 11:58 AM  To contact the [facility] consulting provider between 7A-7P or the covering provider during after hours 7P-7A, please log into the web site [email or web address] and access using universal [place] password for that web site. If you do not have the password, please call the hospital operator.

## 2020-12-02 NOTE — Discharge Instructions (Addendum)
INSTRUCTIONS AFTER JOINT REPLACEMENT   ** INSTRUCTIONS FOR SNF FACILITY -  please check CBC on this patient on 8/20 or 8/21. **  Remove items at home which could result in a fall. This includes throw rugs or furniture in walking pathways ICE to the affected joint every three hours while awake for 30 minutes at a time, for at least the first 3-5 days, and then as needed for pain and swelling.  Continue to use ice for pain and swelling. You may notice swelling that will progress down to the foot and ankle.  This is normal after surgery.  Elevate your leg when you are not up walking on it.   Continue to use the breathing machine you got in the hospital (incentive spirometer) which will help keep your temperature down.  It is common for your temperature to cycle up and down following surgery, especially at night when you are not up moving around and exerting yourself.  The breathing machine keeps your lungs expanded and your temperature down.   DIET:  As you were doing prior to hospitalization, we recommend a well-balanced diet.  DRESSING / WOUND CARE / SHOWERING  Keep the surgical dressing until follow up.  The dressing is water proof, so you can shower without any extra covering.  IF THE DRESSING FALLS OFF or the wound gets wet inside, change the dressing with sterile gauze.  Please use good hand washing techniques before changing the dressing.  Do not use any lotions or creams on the incision until instructed by your surgeon.    ACTIVITY  Increase activity slowly as tolerated, but follow the weight bearing instructions below.   No driving for 6 weeks or until further direction given by your physician.  You cannot drive while taking narcotics.  No lifting or carrying greater than 10 lbs. until further directed by your surgeon. Avoid periods of inactivity such as sitting longer than an hour when not asleep. This helps prevent blood clots.  You may return to work once you are authorized by your  doctor.     WEIGHT BEARING   Weight bearing as tolerated with assist device (walker, cane, etc) as directed, use it as long as suggested by your surgeon or therapist, typically at least 4-6 weeks.   EXERCISES  Results after joint replacement surgery are often greatly improved when you follow the exercise, range of motion and muscle strengthening exercises prescribed by your doctor. Safety measures are also important to protect the joint from further injury. Any time any of these exercises cause you to have increased pain or swelling, decrease what you are doing until you are comfortable again and then slowly increase them. If you have problems or questions, call your caregiver or physical therapist for advice.   Rehabilitation is important following a joint replacement. After just a few days of immobilization, the muscles of the leg can become weakened and shrink (atrophy).  These exercises are designed to build up the tone and strength of the thigh and leg muscles and to improve motion. Often times heat used for twenty to thirty minutes before working out will loosen up your tissues and help with improving the range of motion but do not use heat for the first two weeks following surgery (sometimes heat can increase post-operative swelling).   These exercises can be done on a training (exercise) mat, on the floor, on a table or on a bed. Use whatever works the best and is most comfortable for you.  Use music or television while you are exercising so that the exercises are a pleasant break in your day. This will make your life better with the exercises acting as a break in your routine that you can look forward to.   Perform all exercises about fifteen times, three times per day or as directed.  You should exercise both the operative leg and the other leg as well.  Exercises include:   Quad Sets - Tighten up the muscle on the front of the thigh (Quad) and hold for 5-10 seconds.   Straight Leg  Raises - With your knee straight (if you were given a brace, keep it on), lift the leg to 60 degrees, hold for 3 seconds, and slowly lower the leg.  Perform this exercise against resistance later as your leg gets stronger.  Leg Slides: Lying on your back, slowly slide your foot toward your buttocks, bending your knee up off the floor (only go as far as is comfortable). Then slowly slide your foot back down until your leg is flat on the floor again.  Angel Wings: Lying on your back spread your legs to the side as far apart as you can without causing discomfort.  Hamstring Strength:  Lying on your back, push your heel against the floor with your leg straight by tightening up the muscles of your buttocks.  Repeat, but this time bend your knee to a comfortable angle, and push your heel against the floor.  You may put a pillow under the heel to make it more comfortable if necessary.   A rehabilitation program following joint replacement surgery can speed recovery and prevent re-injury in the future due to weakened muscles. Contact your doctor or a physical therapist for more information on knee rehabilitation.    CONSTIPATION  Constipation is defined medically as fewer than three stools per week and severe constipation as less than one stool per week.  Even if you have a regular bowel pattern at home, your normal regimen is likely to be disrupted due to multiple reasons following surgery.  Combination of anesthesia, postoperative narcotics, change in appetite and fluid intake all can affect your bowels.   YOU MUST use at least one of the following options; they are listed in order of increasing strength to get the job done.  They are all available over the counter, and you may need to use some, POSSIBLY even all of these options:    Drink plenty of fluids (prune juice may be helpful) and high fiber foods Colace 100 mg by mouth twice a day  Senokot for constipation as directed and as needed Dulcolax  (bisacodyl), take with full glass of water  Miralax (polyethylene glycol) once or twice a day as needed.  If you have tried all these things and are unable to have a bowel movement in the first 3-4 days after surgery call either your surgeon or your primary doctor.    If you experience loose stools or diarrhea, hold the medications until you stool forms back up.  If your symptoms do not get better within 1 week or if they get worse, check with your doctor.  If you experience "the worst abdominal pain ever" or develop nausea or vomiting, please contact the office immediately for further recommendations for treatment.   ITCHING:  If you experience itching with your medications, try taking only a single pain pill, or even half a pain pill at a time.  You can also use Benadryl over the counter  for itching or also to help with sleep.   TED HOSE STOCKINGS:  Use stockings on both legs until for at least 2 weeks or as directed by physician office. They may be removed at night for sleeping.  MEDICATIONS:  See your medication summary on the "After Visit Summary" that nursing will review with you.  You may have some home medications which will be placed on hold until you complete the course of blood thinner medication.  It is important for you to complete the blood thinner medication as prescribed.  PRECAUTIONS:  If you experience chest pain or shortness of breath - call 911 immediately for transfer to the hospital emergency department.   If you develop a fever greater that 101 F, purulent drainage from wound, increased redness or drainage from wound, foul odor from the wound/dressing, or calf pain - CONTACT YOUR SURGEON.                                                   FOLLOW-UP APPOINTMENTS:  If you do not already have a post-op appointment, please call the office for an appointment to be seen by your surgeon.  Guidelines for how soon to be seen are listed in your "After Visit Summary", but are typically  between 1-4 weeks after surgery.  OTHER INSTRUCTIONS:   Knee Replacement:  Do not place pillow under knee, focus on keeping the knee straight while resting. CPM instructions: 0-90 degrees, 2 hours in the morning, 2 hours in the afternoon, and 2 hours in the evening. Place foam block, curve side up under heel at all times except when in CPM or when walking.  DO NOT modify, tear, cut, or change the foam block in any way.  POST-OPERATIVE OPIOID TAPER INSTRUCTIONS: It is important to wean off of your opioid medication as soon as possible. If you do not need pain medication after your surgery it is ok to stop day one. Opioids include: Codeine, Hydrocodone(Norco, Vicodin), Oxycodone(Percocet, oxycontin) and hydromorphone amongst others.  Long term and even short term use of opiods can cause: Increased pain response Dependence Constipation Depression Respiratory depression And more.  Withdrawal symptoms can include Flu like symptoms Nausea, vomiting And more Techniques to manage these symptoms Hydrate well Eat regular healthy meals Stay active Use relaxation techniques(deep breathing, meditating, yoga) Do Not substitute Alcohol to help with tapering If you have been on opioids for less than two weeks and do not have pain than it is ok to stop all together.  Plan to wean off of opioids This plan should start within one week post op of your joint replacement. Maintain the same interval or time between taking each dose and first decrease the dose.  Cut the total daily intake of opioids by one tablet each day Next start to increase the time between doses. The last dose that should be eliminated is the evening dose.   MAKE SURE YOU:  Understand these instructions.  Get help right away if you are not doing well or get worse.    Thank you for letting us be a part of your medical care team.  It is a privilege we respect greatly.  We hope these instructions will help you stay on track for a  fast and full recovery!     Heart Failure Education: Weigh yourself EVERY morning after you go  to the bathroom but before you eat or drink anything. Write this number down in a weight log/diary. If you gain 3 pounds overnight or 5 pounds in a week, call the office. Take your medicines as prescribed. If you have concerns about your medications, please call us before you stop taking them.  Eat low salt foods--Limit salt (sodium) to 2000 mg per day. This will help prevent your body from holding onto fluid. Read food labels as many processed foods have a lot of sodium, especially canned goods and prepackaged meats. If you would like some assistance choosing low sodium foods, we would be happy to set you up with a nutritionist. Stay as active as you can everyday. Staying active will give you more energy and make your muscles stronger. Start with 5 minutes at a time and work your way up to 30 minutes a day. Break up your activities--do some in the morning and some in the afternoon. Start with 3 days per week and work your way up to 5 days as you can.  If you have chest pain, feel short of breath, dizzy, or lightheaded, STOP. If you don't feel better after a short rest, call 911. If you do feel better, call the office to let us know you have symptoms with exercise. Limit all fluids for the day to less than 2 liters. Fluid includes all drinks, coffee, juice, ice chips, soup, jello, and all other liquids.

## 2020-12-02 NOTE — Anesthesia Postprocedure Evaluation (Signed)
Anesthesia Post Note  Patient: Cynthia Frank  Procedure(s) Performed: TOTAL HIP ARTHROPLASTY ANTERIOR APPROACH (Left: Hip)     Patient location during evaluation: PACU Anesthesia Type: General Level of consciousness: awake and alert Pain management: pain level controlled Vital Signs Assessment: post-procedure vital signs reviewed and stable Respiratory status: spontaneous breathing, nonlabored ventilation, respiratory function stable and patient connected to nasal cannula oxygen Cardiovascular status: blood pressure returned to baseline and stable Postop Assessment: no apparent nausea or vomiting Anesthetic complications: no   No notable events documented.  Last Vitals:  Vitals:   12/02/20 2030 12/02/20 2045  BP: 104/79 105/78  Pulse: 88 87  Resp: 15 15  Temp:    SpO2: 98% 98%    Last Pain:  Vitals:   12/02/20 2045  TempSrc:   PainSc: 0-No pain                 Tiajuana Amass

## 2020-12-02 NOTE — Interval H&P Note (Signed)
History and Physical Interval Note:  12/02/2020 5:52 PM  Cynthia Frank  has presented today for surgery, with the diagnosis of LEFT HIP FRACTURE.  The various methods of treatment have been discussed with the patient and family. After consideration of risks, benefits and other options for treatment, the patient has consented to  Procedure(s): TOTAL HIP ARTHROPLASTY ANTERIOR APPROACH (Left) as a surgical intervention.  The patient's history has been reviewed, patient examined, no change in status, stable for surgery.  I have reviewed the patient's chart and labs.  Questions were answered to the patient's satisfaction.     [name]

## 2020-12-02 NOTE — Plan of Care (Signed)
  Problem: Pain Managment: Goal: General experience of comfort will improve Outcome: Progressing   Problem: Safety: Goal: Ability to remain free from injury will improve Outcome: Progressing   

## 2020-12-03 ENCOUNTER — Encounter (HOSPITAL_COMMUNITY): Payer: Self-pay | Admitting: Orthopedic Surgery

## 2020-12-03 DIAGNOSIS — D696 Thrombocytopenia, unspecified: Secondary | ICD-10-CM

## 2020-12-03 DIAGNOSIS — I429 Cardiomyopathy, unspecified: Secondary | ICD-10-CM

## 2020-12-03 DIAGNOSIS — I5022 Chronic systolic (congestive) heart failure: Secondary | ICD-10-CM

## 2020-12-03 DIAGNOSIS — D62 Acute posthemorrhagic anemia: Secondary | ICD-10-CM

## 2020-12-03 DIAGNOSIS — E039 Hypothyroidism, unspecified: Secondary | ICD-10-CM

## 2020-12-03 DIAGNOSIS — E119 Type 2 diabetes mellitus without complications: Secondary | ICD-10-CM

## 2020-12-03 LAB — BASIC METABOLIC PANEL
Anion gap: 9 (ref 5–15)
BUN: 20 mg/dL (ref 8–23)
CO2: 21 mmol/L — ABNORMAL LOW (ref 22–32)
Calcium: 7.9 mg/dL — ABNORMAL LOW (ref 8.9–10.3)
Chloride: 101 mmol/L (ref 98–111)
Creatinine, Ser: 0.89 mg/dL (ref 0.44–1.00)
GFR, Estimated: >60 mL/min (ref >60)
Glucose, Bld: 126 mg/dL — ABNORMAL HIGH (ref 70–99)
Potassium: 4.2 mmol/L (ref 3.5–5.1)
Sodium: 131 mmol/L — ABNORMAL LOW (ref 135–145)

## 2020-12-03 LAB — POCT I-STAT 7, (LYTES, BLD GAS, ICA,H+H)
Acid-base deficit: 6 mmol/L — ABNORMAL HIGH (ref 0.0–2.0)
Bicarbonate: 20.8 mmol/L (ref 20.0–28.0)
Calcium, Ion: 1.13 mmol/L — ABNORMAL LOW (ref 1.15–1.40)
HCT: 33 % — ABNORMAL LOW (ref 36.0–46.0)
Hemoglobin: 11.2 g/dL — ABNORMAL LOW (ref 12.0–15.0)
O2 Saturation: 99 %
Patient temperature: 37.5
Potassium: 4.8 mmol/L (ref 3.5–5.1)
Sodium: 132 mmol/L — ABNORMAL LOW (ref 135–145)
TCO2: 22 mmol/L (ref 22–32)
pCO2 arterial: 45.8 mmHg (ref 32.0–48.0)
pH, Arterial: 7.267 — ABNORMAL LOW (ref 7.350–7.450)
pO2, Arterial: 157 mmHg — ABNORMAL HIGH (ref 83.0–108.0)

## 2020-12-03 LAB — GLUCOSE, CAPILLARY
Glucose-Capillary: 131 mg/dL — ABNORMAL HIGH (ref 70–99)
Glucose-Capillary: 106 mg/dL — ABNORMAL HIGH (ref 70–99)
Glucose-Capillary: 175 mg/dL — ABNORMAL HIGH (ref 70–99)
Glucose-Capillary: 177 mg/dL — ABNORMAL HIGH (ref 70–99)
Glucose-Capillary: 125 mg/dL — ABNORMAL HIGH (ref 70–99)

## 2020-12-03 LAB — CBC
HCT: 29.2 % — ABNORMAL LOW (ref 36.0–46.0)
Hemoglobin: 9.3 g/dL — ABNORMAL LOW (ref 12.0–15.0)
MCH: 31.4 pg (ref 26.0–34.0)
MCHC: 31.8 g/dL (ref 30.0–36.0)
MCV: 98.6 fL (ref 80.0–100.0)
Platelets: 118 10*3/uL — ABNORMAL LOW (ref 150–400)
RBC: 2.96 MIL/uL — ABNORMAL LOW (ref 3.87–5.11)
RDW: 14.6 % (ref 11.5–15.5)
WBC: 9 10*3/uL (ref 4.0–10.5)
nRBC: 0 % (ref 0.0–0.2)

## 2020-12-03 MED ORDER — FUROSEMIDE 20 MG PO TABS
20.0000 mg | ORAL_TABLET | Freq: Every day | ORAL | Status: DC
  Administered 2020-12-04: 20 mg via ORAL
  Filled 2020-12-03: qty 1

## 2020-12-03 NOTE — NC FL2 (Deleted)
[place] LEVEL OF CARE SCREENING TOOL     IDENTIFICATION  Patient Name: Cynthia Frank Birthdate: 1935-11-20 Sex: female Admission Date (Current Location): 12/02/2020  [facility] and [place] Number:  Herbalist and Address:  [facility],  [address] [place], [place]      Provider Number: [id number]  Attending Physician Name and Address:  Samuella Cota, MD  Relative Name and Phone Number:  Diffey Ditomaso (spouse) Ph: [phone]    Current Level of Care: Hospital Recommended Level of Care: [place] Prior Approval Number:    Date Approved/Denied:   PASRR Number: [id number]  Discharge Plan: SNF    Current Diagnoses: Patient Active Problem List   Diagnosis Date Noted   Hip fracture ([place]) 12/02/2020   S/P left total hip arthroplasty 12/02/2020   Nausea 11/24/2020   S/P mitral valve clip implantation 07/23/2020   Heart failure with reduced ejection fraction ([place]) 07/18/2020   Hyponatremia 07/18/2020   Anemia 07/18/2020   Elevated LFTs 07/18/2020   Severe mitral regurgitation    Vertigo 03/27/2019   Orthostatic hypotension    Syncope and collapse    Closed right hip fracture ([place]) 12/16/2018   Hypokalemia 12/16/2018   Dysphoric mood 08/11/2017   Insomnia 04/21/2016   Arthritis [id number]   Chronic systolic CHF (congestive heart failure) ([place]) 06/03/2015   GERD (gastroesophageal reflux disease) 04/21/2015   Personal history of colon cancer    Routine general medical examination at a health care facility 03/24/2015   Nonischemic cardiomyopathy [facility])    Coronary artery disease    Hyperlipidemia    Atrial fibrillation ([place]) 12/11/2012   Cough 05/16/2012   THYROID NODULE 04/27/2007   Essential hypertension 04/27/2007    Orientation RESPIRATION BLADDER Height & Weight     Self, Time, Situation, Place  O2 (2L/min) Continent Weight: 123 lb 7.3 oz (56 kg) Height:  '5\' 4"'$  (162.6 cm)  BEHAVIORAL  SYMPTOMS/MOOD NEUROLOGICAL BOWEL NUTRITION STATUS      Continent Diet (Regular diet)  AMBULATORY STATUS COMMUNICATION OF NEEDS Skin   Extensive Assist Verbally Surgical wounds, Skin abrasions, Other (Comment) (Abrasion: left leg & face; ecchymosis: left leg, hip, arm, & hand)                       Personal Care Assistance Level of Assistance  Bathing, Feeding, Dressing Bathing Assistance: Limited assistance Feeding assistance: Independent Dressing Assistance: Limited assistance     Functional Limitations Info  Sight, Hearing, Speech Sight Info: Impaired Hearing Info: Impaired Speech Info: Adequate    SPECIAL CARE FACTORS FREQUENCY  PT (By licensed PT), OT (By licensed OT)     PT Frequency: 5x's/week OT Frequency: 5x's/week            Contractures Contractures Info: Not present    Additional Factors Info  Code Status, Allergies Code Status Info: Full Allergies Info: Clindamycin/lincomycin, Dramamine Ii (Meclizine Hcl), Pneumococcal Vaccines, Sulfonamide Derivatives           Current Medications (12/03/2020):  This is the current hospital active medication list Current Facility-Administered Medications  Medication Dose Route Frequency Provider Last Rate Last Admin   0.9 %  sodium chloride infusion   Intravenous Continuous Irving Copas, PA-C 75 mL/hr at 12/03/20 1050 New Bag at 12/03/20 1050   acetaminophen (TYLENOL) tablet 325-650 mg  325-650 mg Oral Q6H PRN Irving Copas, PA-C   650 mg at 12/03/20 0436   apixaban (ELIQUIS) tablet 2.5 mg  2.5 mg Oral Q12H Irving Copas, PA-C   2.5 mg at 12/03/20 0848   atorvastatin (LIPITOR) tablet 40 mg  40 mg Oral Daily Irving Copas, PA-C   40 mg at 12/02/20 2153   bisacodyl (DULCOLAX) suppository 10 mg  10 mg Rectal Daily PRN Irving Copas, PA-C       citalopram (CELEXA) tablet 20 mg  20 mg Oral Daily Irving Copas, PA-C   20 mg at 12/03/20 0849   diphenhydrAMINE (BENADRYL) 12.5 MG/5ML elixir 12.5-25 mg   12.5-25 mg Oral Q4H PRN Irving Copas, PA-C       docusate sodium (COLACE) capsule 100 mg  100 mg Oral BID Irving Copas, PA-C   100 mg at 12/03/20 0848   feeding supplement (ENSURE SURGERY) liquid 237 mL  237 mL Oral BID BM Irving Copas, PA-C   237 mL at 12/03/20 0847   ferrous sulfate tablet 325 mg  325 mg Oral TID PC Irving Copas, PA-C   325 mg at 12/03/20 1228   [START ON 12/04/2020] furosemide (LASIX) tablet 20 mg  20 mg Oral Daily Jerline Pain, MD       gatifloxacin (ZYMAXID) 0.5 % ophthalmic drops 1 drop  1 drop Left Eye TID Irving Copas, PA-C   1 drop at 12/03/20 0855   HYDROcodone-acetaminophen (NORCO) 7.5-325 MG per tablet 1-2 tablet  1-2 tablet Oral Q4H PRN Irving Copas, PA-C   1 tablet at 12/03/20 0947   HYDROcodone-acetaminophen (NORCO/VICODIN) 5-325 MG per tablet 1-2 tablet  1-2 tablet Oral Q4H PRN Irving Copas, PA-C       insulin aspart (novoLOG) injection 0-9 Units  0-9 Units Subcutaneous Q4H Irving Copas, PA-C   1 Units at 12/03/20 1212   ipratropium (ATROVENT) 0.03 % nasal spray 1 spray  1 spray Each Nare BID Irving Copas, PA-C   1 spray at 12/03/20 [id number]   levothyroxine (SYNTHROID) tablet 25 mcg  25 mcg Oral Q0600 Irving Copas, PA-C   25 mcg at 12/03/20 [id number]   menthol-cetylpyridinium (CEPACOL) lozenge 3 mg  1 lozenge Oral PRN Irving Copas, PA-C       Or   phenol (CHLORASEPTIC) mouth spray 1 spray  1 spray Mouth/Throat PRN Irving Copas, PA-C       methocarbamol (ROBAXIN) tablet 500 mg  500 mg Oral Q6H PRN Irving Copas, PA-C       Or   methocarbamol (ROBAXIN) 500 mg in dextrose 5 % 50 mL IVPB  500 mg Intravenous Q6H PRN Irving Copas, PA-C       metoCLOPramide (REGLAN) tablet 5-10 mg  5-10 mg Oral Q8H PRN Irving Copas, PA-C       Or   metoCLOPramide (REGLAN) injection 5-10 mg  5-10 mg Intravenous Q8H PRN Irving Copas, PA-C       morphine 4 MG/ML injection 0.52-1 mg  0.52-1 mg Intravenous Q2H PRN  Irving Copas, PA-C       multivitamin with minerals tablet 1 tablet  1 tablet Oral Daily Irving Copas, PA-C   1 tablet at 12/03/20 0848   ondansetron (ZOFRAN) tablet 4 mg  4 mg Oral Q6H PRN Irving Copas, PA-C       Or   ondansetron [facility]) injection 4 mg  4 mg Intravenous Q6H PRN Costella Hatcher R, PA-C       polyethylene glycol (MIRALAX / GLYCOLAX) packet 17 g  17 g  Oral Daily PRN Irving Copas, PA-C       potassium chloride SA (KLOR-CON) CR tablet 20 mEq  20 mEq Oral Daily Irving Copas, PA-C   20 mEq at 12/03/20 0848   prednisoLONE acetate (PRED FORTE) 1 % ophthalmic suspension 1 drop  1 drop Left Eye TID Irving Copas, PA-C   1 drop at 12/03/20 [id number]   spironolactone (ALDACTONE) tablet 25 mg  25 mg Oral Daily Irving Copas, PA-C   25 mg at 12/03/20 0848   zolpidem (AMBIEN) tablet 5 mg  5 mg Oral QHS PRN Irving Copas, PA-C         Discharge Medications: Please see discharge summary for a list of discharge medications.  Relevant Imaging Results:  Relevant Lab Results:   Additional Information SSN: [id number]  Sherie Don, LCSW

## 2020-12-03 NOTE — Plan of Care (Signed)
  Problem: Safety: Goal: Ability to remain free from injury will improve Outcome: Progressing   Problem: Skin Integrity: Goal: Risk for impaired skin integrity will decrease Outcome: Progressing   Problem: Activity: Goal: Ability to ambulate and perform ADLs will improve Outcome: Progressing   Problem: Pain Management: Goal: Pain level will decrease Outcome: Progressing

## 2020-12-03 NOTE — TOC Initial Note (Signed)
Transition of Care [facility]) - Initial/Assessment Note   Patient Details  Name: Cynthia Frank MRN: [id number] Date of Birth: 1935/04/30  Transition of Care [facility]) CM/SW Contact:    Sherie Don, LCSW Phone Number: 12/03/2020, 2:39 PM  Clinical Narrative: PT evaluation recommended SNF. CSW spoke with patient's daughter to discuss recommendations. Daughter reported patient is agreeable to SNF and has been before, but would prefer not to return to [facility]. Patient is vaccinated and boosted x2 for COVID.  FL2 done; PASRR confirmed. Initial referral and PT notes faxed out to SNFs. TOC awaiting bed offers.  Expected Discharge Plan: Skilled Nursing Facility Barriers to Discharge: Continued Medical Work up, SNF Pending bed offer  Patient Goals and CMS Choice Patient states their goals for this hospitalization and ongoing recovery are:: Discharge to short-term rehab CMS Medicare.gov Compare Post Acute Care list provided to:: Patient Represenative (must comment) Choice offered to / list presented to : Adult Children, Patient  Expected Discharge Plan and Services Expected Discharge Plan: [place] In-house Referral: Clinical Social Work Post Acute Care Choice: [place] Living arrangements for the past 2 months: Apartment             DME Arranged: N/A DME Agency: NA  Prior Living Arrangements/Services Living arrangements for the past 2 months: Apartment Lives with:: Self Patient language and need for interpreter reviewed:: Yes Do you feel safe going back to the place where you live?: Yes      Need for Family Participation in Patient Care: No (Comment) Care giver support system in place?: Yes (comment) Criminal Activity/Legal Involvement Pertinent to Current Situation/Hospitalization: No - Comment as needed  Activities of Daily Living Home Assistive Devices/Equipment: None ADL Screening (condition at time of admission) Patient's cognitive ability  adequate to safely complete daily activities?: Yes Is the patient deaf or have difficulty hearing?: Yes Does the patient have difficulty seeing, even when wearing glasses/contacts?: No Does the patient have difficulty concentrating, remembering, or making decisions?: No Patient able to express need for assistance with ADLs?: Yes Does the patient have difficulty dressing or bathing?: No Independently performs ADLs?: Yes (appropriate for developmental age) Does the patient have difficulty walking or climbing stairs?: Yes Weakness of Legs: Left Weakness of Arms/Hands: None  Permission Sought/Granted Permission sought to share information with : Facility Art therapist granted to share information with : Yes, Verbal Permission Granted Permission granted to share info w AGENCY: SNFs  Emotional Assessment Appearance:: Appears stated age Attitude/Demeanor/Rapport: Engaged Affect (typically observed): Accepting Orientation: : Oriented to Self, Oriented to Place, Oriented to  Time, Oriented to Situation Alcohol / Substance Use: Not Applicable Psych Involvement: No (comment)  Admission diagnosis:  Hip fracture ([place]) [S72.009A] Patient Active Problem List   Diagnosis Date Noted   Hip fracture ([place]) 12/02/2020   S/P left total hip arthroplasty 12/02/2020   Nausea 11/24/2020   S/P mitral valve clip implantation 07/23/2020   Heart failure with reduced ejection fraction ([place]) 07/18/2020   Hyponatremia 07/18/2020   Anemia 07/18/2020   Elevated LFTs 07/18/2020   Severe mitral regurgitation    Vertigo 03/27/2019   Orthostatic hypotension    Syncope and collapse    Closed right hip fracture ([place]) 12/16/2018   Hypokalemia 12/16/2018   Dysphoric mood 08/11/2017   Insomnia 04/21/2016   Arthritis [id number]   Chronic systolic CHF (congestive heart failure) ([place]) 06/03/2015   GERD (gastroesophageal reflux disease) 04/21/2015   Personal history of colon cancer    Routine  general  medical examination at a health care facility 03/24/2015   Nonischemic cardiomyopathy [facility])    Coronary artery disease    Hyperlipidemia    Atrial fibrillation ([place]) 12/11/2012   Cough 05/16/2012   THYROID NODULE 04/27/2007   Essential hypertension 04/27/2007   PCP:  [name], MD Pharmacy:   [facility]ORE [place], [place] AT [address] [address] Phone: [phone] Fax: [phone]  Readmission Risk Interventions No flowsheet data found.

## 2020-12-03 NOTE — Hospital Course (Signed)
85 year old woman PMH including PAF/flutter, status post multiple cardioversions last 8/3, complicated cardiac history, presented after an episode of syncope thought secondary to orthostatic hypotension resulting in left hip fracture.  Admitted under orthopedic service.  Hospitalist consulted for medical assistance.

## 2020-12-03 NOTE — Progress Notes (Signed)
Subjective: 1 Day Post-Op Procedure(s) (LRB): TOTAL HIP ARTHROPLASTY ANTERIOR APPROACH (Left) Patient reports pain as mild.   Patient seen in rounds for Dr. Alvan Dame. Patient is well, and has had no acute complaints or problems. No acute events overnight. Voiding without difficulty. She reports cramping in the thigh muscles.  We will start therapy today.   Objective: Vital signs in last 24 hours: Temp:  [97.6 F (36.4 C)-98.2 F (36.8 C)] 97.8 F (36.6 C) (08/18 0424) Pulse Rate:  [72-90] 72 (08/18 0424) Resp:  [11-20] 15 (08/18 0424) BP: (90-119)/(65-82) 90/68 (08/18 0424) SpO2:  [95 %-100 %] 99 % (08/18 0424) Arterial Line BP: (110-113)/(64-67) 113/67 (08/17 2030) Weight:  [56 kg] 56 kg (08/17 1633)  Intake/Output from previous day:  Intake/Output Summary (Last 24 hours) at 12/03/2020 0804 Last data filed at 12/03/2020 0607 Gross per 24 hour  Intake 2779.38 ml  Output 1400 ml  Net 1379.38 ml     Intake/Output this shift: No intake/output data recorded.  Labs: Recent Labs    12/02/20 0324 12/02/20 1058 12/02/20 1929 12/03/20 0352  HGB 12.2 12.1 11.2* 9.3*   Recent Labs    12/02/20 1058 12/02/20 1929 12/03/20 0352  WBC 5.6  --  9.0  RBC 3.84*  --  2.96*  HCT 37.1 33.0* 29.2*  PLT 132*  --  118*   Recent Labs    12/02/20 1058 12/02/20 1929 12/03/20 0352  NA 132* 132* 131*  K 4.5 4.8 4.2  CL 99  --  101  CO2 23  --  21*  BUN 21  --  20  CREATININE 0.93  --  0.89  GLUCOSE 119*  --  126*  CALCIUM 8.5*  --  7.9*   Recent Labs    12/02/20 0324 12/02/20 1058  INR 1.3* 1.4*    Exam: General - Patient is Alert and Oriented Extremity - Neurologically intact Sensation intact distally Intact pulses distally Dorsiflexion/Plantar flexion intact Dressing - dressing C/D/I Motor Function - intact, moving foot and toes well on exam.   Past Medical History:  Diagnosis Date   AICD (automatic cardioverter/defibrillator) present    Arthritis    "left  ankle" (07/14/2015)   Atrial fibrillation (HCC)    Cataract    bilateral -not a surgical candidate   CHF (congestive heart failure) ([place])    Colon cancer ([place]) 03/2006   T3, N0   Colon polyps 12/07/2010   Coronary artery disease    nonobstructive with 30% D1   Diabetes mellitus without complication (HCC)    on meds   GERD (gastroesophageal reflux disease)    OTC PRN -diet controlled   History of ovarian cancer 1985   Hx: UTI (urinary tract infection)    Hyperlipidemia    on meds   Hypertension    on meds   Internal hemorrhoid    Nonischemic cardiomyopathy ([place])    last EF assessment 40% by MUGA   Osteoporosis    Prolia once per year   Partial bowel obstruction (HCC)    PVC (premature ventricular contraction)    S/P mitral valve clip implantation 07/23/2020   s/p TEER with a  MitraClip G4 NTW device positioned A2/P2 by Dr. Burt Knack   Thyroid disease    on meds    Assessment/Plan: 1 Day Post-Op Procedure(s) (LRB): TOTAL HIP ARTHROPLASTY ANTERIOR APPROACH (Left) Active Problems:   Hip fracture (HCC)   S/P left total hip arthroplasty  Estimated body mass index is 21.19 kg/m as calculated from the  following:   Height as of this encounter: '5\' 4"'$  (1.626 m).   Weight as of this encounter: 56 kg. Advance diet Up with therapy  DVT Prophylaxis -  Eliquis Weight bearing as tolerated.  Hgb stable at 9.3 this AM.   Plan to get up with PT today to work on mobility and determination of disposition.   Griffith Citron, PA-C Orthopedic Surgery [phone] 12/03/2020, 8:04 AM

## 2020-12-03 NOTE — Evaluation (Signed)
Physical Therapy Evaluation Patient Details Name: Cynthia Frank MRN: [id number] DOB: 14-Sep-1935 Today's Date: 12/03/2020   History of Present Illness  85 y.o. female admitted with L femoral neck fx 2* a fall, s/p THA-anterior approach on 12/02/20. PMH includes: HFrEF, DM, HTN, HLD, hypothyroid, ablation 07/15/20.  Clinical Impression  Pt admitted with above diagnosis. Pt ambulated 20' with RW, verbal cues for partial weight bearing status, distance limited by fatigue. Initiated THA HEP. ST-SNF recommended.  Pt currently with functional limitations due to the deficits listed below (see PT Problem List). Pt will benefit from skilled PT to increase their independence and safety with mobility to allow discharge to the venue listed below.       Follow Up Recommendations SNF;Supervision for mobility/OOB    Equipment Recommendations  None recommended by PT    Recommendations for Other Services       Precautions / Restrictions Precautions Precautions: Fall Precaution Comments: pt reports she has h/o "passing out spells" that occur with no warning Restrictions Weight Bearing Restrictions: Yes LLE Weight Bearing: Partial weight bearing LLE Partial Weight Bearing Percentage or Pounds: 50%      Mobility  Bed Mobility Overal bed mobility: Needs Assistance Bed Mobility: Supine to Sit     Supine to sit: Max assist;+2 for physical assistance          Transfers Overall transfer level: Needs assistance Equipment used: Rolling walker (2 wheeled) Transfers: Sit to/from Stand Sit to Stand: Mod assist;+2 physical assistance         General transfer comment: assist to rise, VCs hand placement  Ambulation/Gait Ambulation/Gait assistance: Min guard;+2 safety/equipment Gait Distance (Feet): 20 Feet Assistive device: Rolling walker (2 wheeled) Gait Pattern/deviations: Step-to pattern;Decreased step length - left;Decreased step length - right Gait velocity: decr   General Gait  Details: VCs sequencing and for PWB status, no loss of balance, distance limited by fatigue, vital signs stable. BP 94/69 sitting, SaO2 98% on RA  Stairs            Wheelchair Mobility    Modified Rankin (Stroke Patients Only)       Balance Overall balance assessment: Needs assistance;History of Falls Sitting-balance support: Feet supported;No upper extremity supported Sitting balance-Leahy Scale: Fair     Standing balance support: Bilateral upper extremity supported Standing balance-Leahy Scale: Poor Standing balance comment: relies on BUE support                             Pertinent Vitals/Pain Pain Assessment: Faces Faces Pain Scale: Hurts little more Pain Location: L hip Pain Descriptors / Indicators: Grimacing Pain Intervention(s): Limited activity within patient's tolerance;Monitored during session;Premedicated before session;Ice applied;Repositioned    Home Living Family/patient expects to be discharged to:: Private residence Living Arrangements: Spouse/significant other Available Help at Discharge: Family;Available 24 hours/day Type of Home: House Home Access: Stairs to enter   [employer] of Steps: 1 Home Layout: Two level Home Equipment: Cane - single point;Walker - 2 wheels;Bedside commode      Prior Function Level of Independence: Independent         Comments: independent bathing/dressing, no other falls in past 1 year, husband home 24*     Hand Dominance        Extremity/Trunk Assessment   Upper Extremity Assessment Upper Extremity Assessment: Overall WFL for tasks assessed    Lower Extremity Assessment Lower Extremity Assessment: LLE deficits/detail LLE Deficits / Details: knee ext at least 3/5, hip ~-  3/5 LLE Sensation: WNL    Cervical / Trunk Assessment Cervical / Trunk Assessment: Normal  Communication   Communication: No difficulties  Cognition Arousal/Alertness: Awake/alert Behavior During Therapy:  WFL for tasks assessed/performed Overall Cognitive Status: Within Functional Limits for tasks assessed                                        General Comments      Exercises Total Joint Exercises Ankle Circles/Pumps: AROM;Both;10 reps;Supine Heel Slides: AAROM;Left;10 reps;Supine Hip ABduction/ADduction: AAROM;Left;10 reps;Supine Long Arc Quad: AROM;Left;5 reps;Seated   Assessment/Plan    PT Assessment Patient needs continued PT services  PT Problem List Decreased strength;Decreased activity tolerance;Decreased balance;Pain;Decreased knowledge of precautions;Decreased mobility       PT Treatment Interventions Therapeutic activities;Therapeutic exercise;Gait training;Functional mobility training;Patient/family education    PT Goals (Current goals can be found in the Care Plan section)  Acute Rehab PT Goals Patient Stated Goal: hoping to go to rehab then home PT Goal Formulation: With patient/family Time For Goal Achievement: 12/10/20 Potential to Achieve Goals: Good    Frequency Min 4X/week   Barriers to discharge        Co-evaluation               AM-PAC PT "6 Clicks" Mobility  Outcome Measure Help needed turning from your back to your side while in a flat bed without using bedrails?: A Lot Help needed moving from lying on your back to sitting on the side of a flat bed without using bedrails?: A Lot Help needed moving to and from a bed to a chair (including a wheelchair)?: A Lot Help needed standing up from a chair using your arms (e.g., wheelchair or bedside chair)?: A Lot Help needed to walk in hospital room?: A Little Help needed climbing 3-5 steps with a railing? : A Lot 6 Click Score: 13    End of Session Equipment Utilized During Treatment: Gait belt Activity Tolerance: Patient limited by fatigue Patient left: in chair;with call bell/phone within reach;with family/visitor present Nurse Communication: Mobility status PT Visit Diagnosis:  Difficulty in walking, not elsewhere classified (R26.2);Pain Pain - Right/Left: Left Pain - part of body: Hip    Time: [id number] PT Time Calculation (min) (ACUTE ONLY): 29 min   Charges:   PT Evaluation $PT Eval Moderate Complexity: 1 Mod PT Treatments $Gait Training: 8-22 mins       Blondell Reveal Kistler PT 12/03/2020  Acute Rehabilitation Services Pager [phone] Office [phone]

## 2020-12-03 NOTE — NC FL2 (Signed)
[place] LEVEL OF CARE SCREENING TOOL     IDENTIFICATION  Patient Name: Cynthia Frank Birthdate: August 10, 1935 Sex: female Admission Date (Current Location): 12/02/2020  [facility] and [place] Number:  Herbalist and Address:  [facility],  [address], [place]      Provider Number: [id number]  Attending Physician Name and Address:  Samuella Cota, MD;O*  Relative Name and Phone Number:  Diffey Hooser (spouse) Ph: [phone]    Current Level of Care: Hospital Recommended Level of Care: [place] Prior Approval Number:    Date Approved/Denied:   PASRR Number: [id number]  Discharge Plan: SNF    Current Diagnoses: Patient Active Problem List   Diagnosis Date Noted   Hip fracture ([employer]) 12/02/2020   S/P left total hip arthroplasty 12/02/2020   Nausea 11/24/2020   S/P mitral valve clip implantation 07/23/2020   Heart failure with reduced ejection fraction ([place]) 07/18/2020   Hyponatremia 07/18/2020   Anemia 07/18/2020   Elevated LFTs 07/18/2020   Severe mitral regurgitation    Vertigo 03/27/2019   Orthostatic hypotension    Syncope and collapse    Closed right hip fracture ([place]) 12/16/2018   Hypokalemia 12/16/2018   Dysphoric mood 08/11/2017   Insomnia 04/21/2016   Arthritis [id number]   Chronic systolic CHF (congestive heart failure) ([place]) 06/03/2015   GERD (gastroesophageal reflux disease) 04/21/2015   Personal history of colon cancer    Routine general medical examination at a health care facility 03/24/2015   Nonischemic cardiomyopathy [facility])    Coronary artery disease    Hyperlipidemia    Atrial fibrillation ([place]) 12/11/2012   Cough 05/16/2012   THYROID NODULE 04/27/2007   Essential hypertension 04/27/2007    Orientation RESPIRATION BLADDER Height & Weight     Self, Time, Situation, Place  O2 (2L/min) Continent Weight: 123 lb 7.3 oz (56 kg) Height:  '5\' 4"'$  (162.6 cm)  BEHAVIORAL  SYMPTOMS/MOOD NEUROLOGICAL BOWEL NUTRITION STATUS      Continent Diet (Regular diet)  AMBULATORY STATUS COMMUNICATION OF NEEDS Skin   Extensive Assist Verbally Surgical wounds, Skin abrasions, Other (Comment) (Abrasion: left leg & face; ecchymosis: left leg, hip, arm, & hand)                       Personal Care Assistance Level of Assistance  Bathing, Feeding, Dressing Bathing Assistance: Limited assistance Feeding assistance: Independent Dressing Assistance: Limited assistance     Functional Limitations Info  Sight, Hearing, Speech Sight Info: Impaired Hearing Info: Impaired Speech Info: Adequate    SPECIAL CARE FACTORS FREQUENCY  PT (By licensed PT), OT (By licensed OT)     PT Frequency: 5x's/week OT Frequency: 5x's/week            Contractures Contractures Info: Not present    Additional Factors Info  Code Status, Allergies Code Status Info: Full Allergies Info: Clindamycin/lincomycin, Dramamine Ii (Meclizine Hcl), Pneumococcal Vaccines, Sulfonamide Derivatives           Current Medications (12/03/2020):  This is the current hospital active medication list Current Facility-Administered Medications  Medication Dose Route Frequency Provider Last Rate Last Admin   0.9 %  sodium chloride infusion   Intravenous Continuous Irving Copas, PA-C 75 mL/hr at 12/03/20 1050 New Bag at 12/03/20 1050   acetaminophen (TYLENOL) tablet 325-650 mg  325-650 mg Oral Q6H PRN Irving Copas, PA-C   650 mg at 12/03/20 0436   apixaban (ELIQUIS) tablet 2.5 mg  2.5 mg Oral Q12H Irving Copas, PA-C   2.5 mg at 12/03/20 0848   atorvastatin (LIPITOR) tablet 40 mg  40 mg Oral Daily Irving Copas, PA-C   40 mg at 12/02/20 2153   bisacodyl (DULCOLAX) suppository 10 mg  10 mg Rectal Daily PRN Irving Copas, PA-C       citalopram (CELEXA) tablet 20 mg  20 mg Oral Daily Irving Copas, PA-C   20 mg at 12/03/20 0849   diphenhydrAMINE (BENADRYL) 12.5 MG/5ML elixir 12.5-25 mg   12.5-25 mg Oral Q4H PRN Irving Copas, PA-C       docusate sodium (COLACE) capsule 100 mg  100 mg Oral BID Irving Copas, PA-C   100 mg at 12/03/20 0848   feeding supplement (ENSURE SURGERY) liquid 237 mL  237 mL Oral BID BM Irving Copas, PA-C   237 mL at 12/03/20 0847   ferrous sulfate tablet 325 mg  325 mg Oral TID PC Irving Copas, PA-C   325 mg at 12/03/20 1228   [START ON 12/04/2020] furosemide (LASIX) tablet 20 mg  20 mg Oral Daily Jerline Pain, MD       gatifloxacin (ZYMAXID) 0.5 % ophthalmic drops 1 drop  1 drop Left Eye TID Irving Copas, PA-C   1 drop at 12/03/20 0855   HYDROcodone-acetaminophen (NORCO) 7.5-325 MG per tablet 1-2 tablet  1-2 tablet Oral Q4H PRN Irving Copas, PA-C   1 tablet at 12/03/20 0947   HYDROcodone-acetaminophen (NORCO/VICODIN) 5-325 MG per tablet 1-2 tablet  1-2 tablet Oral Q4H PRN Irving Copas, PA-C       insulin aspart (novoLOG) injection 0-9 Units  0-9 Units Subcutaneous Q4H Irving Copas, PA-C   1 Units at 12/03/20 1212   ipratropium (ATROVENT) 0.03 % nasal spray 1 spray  1 spray Each Nare BID Irving Copas, PA-C   1 spray at 12/03/20 [id number]   levothyroxine (SYNTHROID) tablet 25 mcg  25 mcg Oral Q0600 Irving Copas, PA-C   25 mcg at 12/03/20 [id number]   menthol-cetylpyridinium (CEPACOL) lozenge 3 mg  1 lozenge Oral PRN Irving Copas, PA-C       Or   phenol (CHLORASEPTIC) mouth spray 1 spray  1 spray Mouth/Throat PRN Irving Copas, PA-C       methocarbamol (ROBAXIN) tablet 500 mg  500 mg Oral Q6H PRN Irving Copas, PA-C       Or   methocarbamol (ROBAXIN) 500 mg in dextrose 5 % 50 mL IVPB  500 mg Intravenous Q6H PRN Irving Copas, PA-C       metoCLOPramide (REGLAN) tablet 5-10 mg  5-10 mg Oral Q8H PRN Irving Copas, PA-C       Or   metoCLOPramide (REGLAN) injection 5-10 mg  5-10 mg Intravenous Q8H PRN Irving Copas, PA-C       morphine 4 MG/ML injection 0.52-1 mg  0.52-1 mg Intravenous Q2H PRN  Irving Copas, PA-C       multivitamin with minerals tablet 1 tablet  1 tablet Oral Daily Irving Copas, PA-C   1 tablet at 12/03/20 0848   ondansetron (ZOFRAN) tablet 4 mg  4 mg Oral Q6H PRN Irving Copas, PA-C       Or   ondansetron [facility]) injection 4 mg  4 mg Intravenous Q6H PRN Costella Hatcher R, PA-C       polyethylene glycol (MIRALAX / GLYCOLAX) packet 17 g  17 g  Oral Daily PRN Irving Copas, PA-C       potassium chloride SA (KLOR-CON) CR tablet 20 mEq  20 mEq Oral Daily Irving Copas, PA-C   20 mEq at 12/03/20 0848   prednisoLONE acetate (PRED FORTE) 1 % ophthalmic suspension 1 drop  1 drop Left Eye TID Irving Copas, PA-C   1 drop at 12/03/20 [id number]   spironolactone (ALDACTONE) tablet 25 mg  25 mg Oral Daily Irving Copas, PA-C   25 mg at 12/03/20 0848   zolpidem (AMBIEN) tablet 5 mg  5 mg Oral QHS PRN Irving Copas, PA-C         Discharge Medications: Please see discharge summary for a list of discharge medications.  Relevant Imaging Results:  Relevant Lab Results:   Additional Information SSN: [id number]  Sherie Don, LCSW

## 2020-12-03 NOTE — Progress Notes (Signed)
Progress Note  Patient Name: Cynthia Frank Date of Encounter: 12/03/2020  [facility] Cardiologist: [name], MD   Subjective   Postop left hip.  Progressing.  No chest pain, no shortness of breath.  Inpatient Medications    Scheduled Meds:  apixaban  2.5 mg Oral Q12H   atorvastatin  40 mg Oral Daily   citalopram  20 mg Oral Daily   dexamethasone (DECADRON) injection  10 mg Intravenous Once   docusate sodium  100 mg Oral BID   feeding supplement  237 mL Oral BID BM   ferrous sulfate  325 mg Oral TID PC   furosemide  40 mg Oral Daily   gatifloxacin  1 drop Left Eye TID   insulin aspart  0-9 Units Subcutaneous Q4H   ipratropium  1 spray Each Nare BID   levothyroxine  25 mcg Oral Q0600   multivitamin with minerals  1 tablet Oral Daily   potassium chloride SA  20 mEq Oral Daily   prednisoLONE acetate  1 drop Left Eye TID   spironolactone  25 mg Oral Daily   Continuous Infusions:  sodium chloride 75 mL/hr at 12/02/20 2155   methocarbamol (ROBAXIN) IV     PRN Meds: acetaminophen, bisacodyl, diphenhydrAMINE, HYDROcodone-acetaminophen, HYDROcodone-acetaminophen, menthol-cetylpyridinium **OR** phenol, methocarbamol **OR** methocarbamol (ROBAXIN) IV, metoCLOPramide **OR** metoCLOPramide (REGLAN) injection, morphine injection, ondansetron **OR** ondansetron (ZOFRAN) IV, polyethylene glycol, zolpidem   Vital Signs    Vitals:   12/02/20 2030 12/02/20 2045 12/03/20 0117 12/03/20 0424  BP: 104/79 1'05/78 96/65 90/68 '$  Pulse: 88 87 77 72  Resp: '15 15 16 15  '$ Temp:   97.7 F (36.5 C) 97.8 F (36.6 C)  TempSrc:   Oral Oral  SpO2: 98% 98% 98% 99%  Weight:      Height:        Intake/Output Summary (Last 24 hours) at 12/03/2020 0836 Last data filed at 12/03/2020 0826 Gross per 24 hour  Intake 2779.38 ml  Output 1400 ml  Net 1379.38 ml   Last 3 Weights 12/02/2020 12/02/2020 11/24/2020  Weight (lbs) 123 lb 7.3 oz 123 lb 7.3 oz 121 lb 3.2 oz  Weight (kg) 56 kg 56 kg  54.976 kg       ECG    No new tracings - Personally Reviewed  Physical Exam   GEN: No acute distress.   Neck: No JVD Cardiac: RRR, 1/6 systolic murmur, no rubs, or gallops.  Respiratory: Clear to auscultation bilaterally. GI: Soft, nontender, non-distended  MS: No edema; No deformity. Neuro:  Nonfocal  Psych: Normal affect   Labs    High Sensitivity Troponin:  No results for input(s): TROPONINIHS in the last 720 hours.    Chemistry Recent Labs  Lab 12/02/20 0324 12/02/20 1058 12/02/20 1929 12/03/20 0352  NA 131* 132* 132* 131*  K 4.7 4.5 4.8 4.2  CL 101 99  --  101  CO2 22 23  --  21*  GLUCOSE 118* 119*  --  126*  BUN 19 21  --  20  CREATININE 0.86 0.93  --  0.89  CALCIUM 8.5* 8.5*  --  7.9*  PROT 6.6 6.6  --   --   ALBUMIN 3.5 3.4*  --   --   AST 29 28  --   --   ALT 43 41  --   --   ALKPHOS 66 63  --   --   BILITOT 2.4* 2.5*  --   --   GFRNONAA >60 >60  --  >  Johnson 10  --  9     Hematology Recent Labs  Lab 12/02/20 0324 12/02/20 1058 12/02/20 1929 12/03/20 0352  WBC 6.2 5.6  --  9.0  RBC 3.94 3.84*  --  2.96*  HGB 12.2 12.1 11.2* 9.3*  HCT 37.3 37.1 33.0* 29.2*  MCV 94.7 96.6  --  98.6  MCH 31.0 31.5  --  31.4  MCHC 32.7 32.6  --  31.8  RDW 14.5 14.6  --  14.6  PLT 132* 132*  --  118*    BNPNo results for input(s): BNP, PROBNP in the last 168 hours.   DDimer No results for input(s): DDIMER in the last 168 hours.   Radiology    DG Pelvis Portable  Result Date: 12/02/2020 CLINICAL DATA:  Status post left hip replacement EXAM: PORTABLE PELVIS 1 VIEWS COMPARISON:  Film from earlier in the same day. FINDINGS: Left hip replacement is now seen new from the prior exam. Stable right hip replacement is seen. Pelvic ring is intact. No bony or soft tissue abnormality is noted. IMPRESSION: Status post left hip replacement Electronically Signed   By: Inez Catalina M.D.   On: 12/02/2020 20:46   DG Pelvis Portable  Result Date:  12/02/2020 CLINICAL DATA:  Left hip pain EXAM: PORTABLE PELVIS 1-2 VIEWS COMPARISON:  Pelvis radiographs 12/18/2018 FINDINGS: There is a mildly displaced fracture through the left femoral neck with proximal and mild lateral displacement of the distal fragment. The femoral head remains seated within the acetabulum. Right hip arthroplasty hardware appears intact. The SI joints and symphysis pubis are intact. The soft tissues are unremarkable. IMPRESSION: Mildly displaced fracture of the left femoral neck as above. Electronically Signed   By: Valetta Mole M.D.   On: 12/02/2020 10:33   DG C-Arm 1-60 Min-No Report  Result Date: 12/02/2020 Fluoroscopy was utilized by the requesting physician.  No radiographic interpretation.   DG HIP OPERATIVE UNILAT WITH PELVIS LEFT  Result Date: 12/02/2020 CLINICAL DATA:  Hip replacement EXAM: OPERATIVE left HIP (WITH PELVIS IF PERFORMED) 2 VIEWS TECHNIQUE: Fluoroscopic spot image(s) were submitted for interpretation post-operatively. COMPARISON:  12/02/2020 FINDINGS: Two low resolution intraoperative spot views of the left hip. Total fluoroscopy time was 6 seconds. The images demonstrate a left hip replacement. IMPRESSION: Intraoperative fluoroscopic assistance provided during left hip replacement Electronically Signed   By: Donavan Foil M.D.   On: 12/02/2020 19:52    Cardiac Studies   Right Heart Catheterization 07/22/2020: 1. Low to normal filling pressures.  2. Preserved cardiac output.    Filling pressures optimized after aggressive diuresis.  Will stop IV Lasix today and will give a small amt of NS with RA pressure 1.  _______________   Echocardiogram 08/27/2020: Impressions: 1. Left ventricular ejection fraction, by estimation, is 20 to 25%. The  left ventricle has severely decreased function. The left ventricle  demonstrates global hypokinesis. The left ventricular internal cavity size  was mildly dilated. Left ventricular  diastolic parameters are  indeterminate.   2. Right ventricular systolic function is moderately reduced. The right  ventricular size is normal. There is mildly elevated pulmonary artery  systolic pressure. The estimated right ventricular systolic pressure is  [id number] mmHg.   3. Left atrial size was mildly dilated.   4. There is a Mitra-Clip present in the A2-P2 position. Trivial mitral  valve regurgitation. No evidence of mitral stenosis. MG 30mHg at HR 111  bpm.   5. The aortic valve is tricuspid. Aortic valve regurgitation  is mild.  Mild aortic valve sclerosis is present, with no evidence of aortic valve  stenosis.   6. The inferior vena cava is normal in size with greater than 50%  respiratory variability, suggesting right atrial pressure of 3 mmHg.   7. Evidence of atrial level shunting detected by color flow Doppler.  There is a small atrial septal defect with predominantly left to right  shunting across the atrial septum.     Patient Profile     85 y.o. female  with a history of coronary calcifications noted on CT scan in 07/2019, non-ischemic cardiomyopathy/chronic systolic CHF with EF of 0000000, s/p ICD, paroxysmal atrial fibrillation/flutter s/p atrial fibrillation ablation in 07/2019 and then redo ablation in 06/2020 and then DCCV for atrial flutter on 11/18/2020, mitral regurgitation s/p MitraClip in 07/2020, hypertension, hyperlipidemia, diabetes mellitus, GERD, who is being followed by cardiology for pre-op evaluation for hip fracture  Assessment & Plan    1.  Left hip fracture: patient presented after a fall resulting in a left hip fracture. Seen for preop evaluation 12/02/20 and found to be an acceptable risk for surgery. She was noted to be at increased risk for cardiac thrombus/CVA given recent DCCV 11/18/20, however delaying hip fracture repair would only increase morbidity/mortality so she was cleared to hold eliquis. Patient underwent hip fracture repair 0000000 without complication. Continues to maintain sinus  rhythm. Eliquis restarted this morning.  - Continue post-op care per ortho/primary team -Discussed her care with Dr. Aundra Dubin earlier this morning.  2. Non-Ischemic Cardiomyopathy/Chronic Systolic CHF s/p Medtronic ICD: Echo in 08/2020 showed LVEF of 20-25% with global hypokinesis, moderate RV dysfunction, mild AI, and trial MR with MitraClip present. Appears euvolemic on exam. Home Lasix '40mg'$  daily and Spironolactone '25mg'$  daily currently on hold. Consider restarting after surgery if BP will allow - still a bit soft today so will continue to hold. Has not been able to tolerate Toprol due to excessive fatigue. No longer on Entresto given syncope and lightheadedness while taking. - Continue home Farxiga '10mg'$  daily. - Monitor volume status closely perioperatively.    3. Paroxysmal Atrial Fibrillation/Flutter: S/p atrial fibrillation ablation in 07/2019 and 06/2020. S/p DCCV on 11/18/2020 for atrial flutter. Looks to be maintaining sinus rhythm. Not on any AV nodal agents. Eliquis held for hip fx repair and restarted this morning - Continue eliquis for stroke ppx - dose adjusted for age and weight   4. Non-Obstructive CAD: Non-obstructive CAD noted on cath in 2001. Coronary calcifications seen on CT in 07/2019. No angina. No aspirin given she is on Eliquis.  - Continue Lipitor '40mg'$  daily.   5. Possible Syncope: Possibly syncope that led to fall. Patient does have a history of syncope secondary to hypotension/orthostatic hypotension but this is usually preceded by lightheadedness. Denies any prodromal symptoms prior to this event. Recent Echo in 08/2020 as above. BP does run soft. Unclear what patient's BP was at the time of the events. Unable to see records from hospital at [facility].     6. Hyperlipidemia: LDL 52 11/2019 - Continue home Lipitor '40mg'$  daily.   Otherwise, per primary team: - Hip fracture - Type 2 diabetes  - Hypothyroidism - IBS - GERD    For questions or updates, please contact [facility] Please consult [email or web address] for contact info under        Signed, Abigail Butts, PA-C  12/03/2020, 8:36 AM    Personally seen and examined. Agree with above.  Overall doing well postoperative left  hip fracture.  Spoke to Dr. Aundra Dubin earlier this morning.  She has had trouble with hypotension secondary to [facility], beta-blockers etc.  Could her falls be secondary to excessive orthostasis.  Perhaps.  We will hold off on specific goal-directed medical therapy because of this.  Agree with restarting Eliquis.  Recent cardioversion.  Overall doing well currently.  No changes made.  Okay with continuing spironolactone 25.  I will decrease her Lasix to 20 mg a day.  Candee Furbish, MD

## 2020-12-03 NOTE — Progress Notes (Addendum)
PROGRESS NOTE  Cynthia Frank [id number] DOB: 06-24-1935 DOA: 12/02/2020 PCP: [name], MD  Brief History   85 year old woman PMH including PAF/flutter, status post multiple cardioversions last 8/3, complicated cardiac history, presented after an episode of syncope thought secondary to orthostatic hypotension resulting in left hip fracture.  Admitted under orthopedic service.  Hospitalist consulted for medical assistance.  A & P  Displaced femoral neck fracture left --Status post surgery 8/17.  Management per orthopedics.  ABLA --s/p surgery --check CBC in AM  Hyponatremia --mild, chronic, f/u as an outpatient, on SSRI  Thrombocytopenia  --mild, chronic plus secondary to blood loss --CBC in AM  Syncope, non-Ischemic Cardiomyopathy, chronic Systolic CHF, s/p Medtronic ICD, persistent atrial fib/flutter s/p cardioversion 8/3, CAD --per cardiology may be vasovagal, low BP, meds adjusted  Hypothyroid --Continue Synthroid.   Type II DM:  Hold [place].  Monitor CBGs use SSI..  May resume Farxiga at discharge   Disposition Plan:  Discussion:   DVT prophylaxis: apixaban (ELIQUIS) tablet 2.5 mg Start: 12/03/20 0800 SCDs Start: 12/02/20 2036 Place TED hose Start: 12/02/20 2036 SCDs Start: 12/02/20 0109 apixaban (ELIQUIS) tablet 2.5 mg     Code Status: Full Code Level of care: Med-Surg Family Communication: husband at bedside  [name], MD  [facility] Direct contact: see [email or web address] (further directions at bottom of note if needed) 7PM-7AM contact night coverage as at bottom of note 12/03/2020, 3:57 PM  LOS: 1 day   Significant Hospital Events      Consults:     Procedures:    Significant Diagnostic Tests:     Micro Data:     Antimicrobials:    Interval History/Subjective  CC: f/u hip fx  Feels ok today  Objective   Vitals:  Vitals:   12/03/20 1339 12/03/20 1340  BP: 101/67 94/69  Pulse: 82   Resp: 16   Temp: 97.6  F (36.4 C)   SpO2: 99%     Exam: Physical Exam Vitals reviewed.  Constitutional:      Appearance: Normal appearance.  Cardiovascular:     Rate and Rhythm: Normal rate and regular rhythm.     Heart sounds: No murmur heard. Pulmonary:     Effort: Pulmonary effort is normal. No respiratory distress.     Breath sounds: Normal breath sounds. No wheezing.  Musculoskeletal:     Right lower leg: No edema.     Left lower leg: No edema.  Neurological:     Mental Status: She is alert.  Psychiatric:        Mood and Affect: Mood normal.        Behavior: Behavior normal.    I have personally reviewed the labs and other data, making special note of:   Today's Data  CBG stable Na 131 stable Hgb down to 9.3 Plts 118   Scheduled Meds:  apixaban  2.5 mg Oral Q12H   atorvastatin  40 mg Oral Daily   citalopram  20 mg Oral Daily   docusate sodium  100 mg Oral BID   feeding supplement  237 mL Oral BID BM   ferrous sulfate  325 mg Oral TID PC   [START ON 12/04/2020] furosemide  20 mg Oral Daily   gatifloxacin  1 drop Left Eye TID   insulin aspart  0-9 Units Subcutaneous Q4H   ipratropium  1 spray Each Nare BID   levothyroxine  25 mcg Oral Q0600   multivitamin with minerals  1 tablet Oral Daily   potassium  chloride SA  20 mEq Oral Daily   prednisoLONE acetate  1 drop Left Eye TID   spironolactone  25 mg Oral Daily   Continuous Infusions:  sodium chloride 75 mL/hr at 12/03/20 1050   methocarbamol (ROBAXIN) IV      Active Problems:   Hip fracture (HCC)   S/P left total hip arthroplasty   LOS: 1 day   How to contact the [facility] Attending or Consulting provider [place] or covering provider during after hours [place], for this patient?  Check the care team in [facility] and look for a) attending/consulting TRH provider listed and b) the [facility] team listed Log into [email or web address] and use [place]'s universal password to access. If you do not have the password, please contact the hospital  operator. Locate the [facility] provider you are looking for under [facility] and page to a number that you can be directly reached. If you still have difficulty reaching the provider, please page the [facility] (Director on Call) for the Hospitalists listed on amion for assistance.

## 2020-12-03 NOTE — Plan of Care (Signed)
?  Problem: Activity: ?Goal: Risk for activity intolerance will decrease ?Outcome: Progressing ?  ?Problem: Safety: ?Goal: Ability to remain free from injury will improve ?Outcome: Progressing ?  ?Problem: Pain Managment: ?Goal: General experience of comfort will improve ?Outcome: Progressing ?  ?

## 2020-12-04 DIAGNOSIS — R42 Dizziness and giddiness: Secondary | ICD-10-CM | POA: Diagnosis not present

## 2020-12-04 DIAGNOSIS — I2510 Atherosclerotic heart disease of native coronary artery without angina pectoris: Secondary | ICD-10-CM | POA: Diagnosis not present

## 2020-12-04 DIAGNOSIS — D649 Anemia, unspecified: Secondary | ICD-10-CM | POA: Diagnosis not present

## 2020-12-04 DIAGNOSIS — M6281 Muscle weakness (generalized): Secondary | ICD-10-CM | POA: Diagnosis not present

## 2020-12-04 DIAGNOSIS — W19XXXA Unspecified fall, initial encounter: Secondary | ICD-10-CM | POA: Diagnosis not present

## 2020-12-04 DIAGNOSIS — R279 Unspecified lack of coordination: Secondary | ICD-10-CM | POA: Diagnosis not present

## 2020-12-04 DIAGNOSIS — I10 Essential (primary) hypertension: Secondary | ICD-10-CM | POA: Diagnosis not present

## 2020-12-04 DIAGNOSIS — R2689 Other abnormalities of gait and mobility: Secondary | ICD-10-CM | POA: Diagnosis not present

## 2020-12-04 DIAGNOSIS — R531 Weakness: Secondary | ICD-10-CM | POA: Diagnosis not present

## 2020-12-04 DIAGNOSIS — Z95810 Presence of automatic (implantable) cardiac defibrillator: Secondary | ICD-10-CM | POA: Diagnosis not present

## 2020-12-04 DIAGNOSIS — I5042 Chronic combined systolic (congestive) and diastolic (congestive) heart failure: Secondary | ICD-10-CM | POA: Diagnosis not present

## 2020-12-04 DIAGNOSIS — E871 Hypo-osmolality and hyponatremia: Secondary | ICD-10-CM

## 2020-12-04 DIAGNOSIS — I428 Other cardiomyopathies: Secondary | ICD-10-CM | POA: Diagnosis not present

## 2020-12-04 DIAGNOSIS — E785 Hyperlipidemia, unspecified: Secondary | ICD-10-CM | POA: Diagnosis not present

## 2020-12-04 DIAGNOSIS — I5022 Chronic systolic (congestive) heart failure: Secondary | ICD-10-CM | POA: Diagnosis not present

## 2020-12-04 DIAGNOSIS — R2681 Unsteadiness on feet: Secondary | ICD-10-CM | POA: Diagnosis not present

## 2020-12-04 DIAGNOSIS — D696 Thrombocytopenia, unspecified: Secondary | ICD-10-CM | POA: Diagnosis not present

## 2020-12-04 DIAGNOSIS — Z96642 Presence of left artificial hip joint: Secondary | ICD-10-CM | POA: Diagnosis not present

## 2020-12-04 DIAGNOSIS — K219 Gastro-esophageal reflux disease without esophagitis: Secondary | ICD-10-CM | POA: Diagnosis not present

## 2020-12-04 DIAGNOSIS — Z9181 History of falling: Secondary | ICD-10-CM | POA: Diagnosis not present

## 2020-12-04 DIAGNOSIS — R1310 Dysphagia, unspecified: Secondary | ICD-10-CM | POA: Diagnosis not present

## 2020-12-04 DIAGNOSIS — R262 Difficulty in walking, not elsewhere classified: Secondary | ICD-10-CM | POA: Diagnosis not present

## 2020-12-04 DIAGNOSIS — S72092D Other fracture of head and neck of left femur, subsequent encounter for closed fracture with routine healing: Secondary | ICD-10-CM | POA: Diagnosis not present

## 2020-12-04 DIAGNOSIS — I4891 Unspecified atrial fibrillation: Secondary | ICD-10-CM | POA: Diagnosis not present

## 2020-12-04 DIAGNOSIS — S72002A Fracture of unspecified part of neck of left femur, initial encounter for closed fracture: Secondary | ICD-10-CM | POA: Diagnosis not present

## 2020-12-04 DIAGNOSIS — R41841 Cognitive communication deficit: Secondary | ICD-10-CM | POA: Diagnosis not present

## 2020-12-04 DIAGNOSIS — Z7401 Bed confinement status: Secondary | ICD-10-CM | POA: Diagnosis not present

## 2020-12-04 DIAGNOSIS — I429 Cardiomyopathy, unspecified: Secondary | ICD-10-CM | POA: Diagnosis not present

## 2020-12-04 DIAGNOSIS — E119 Type 2 diabetes mellitus without complications: Secondary | ICD-10-CM | POA: Diagnosis not present

## 2020-12-04 DIAGNOSIS — D62 Acute posthemorrhagic anemia: Secondary | ICD-10-CM

## 2020-12-04 LAB — RESP PANEL BY RT-PCR (FLU A&B, COVID) ARPGX2
Influenza A by PCR: NEGATIVE
Influenza B by PCR: NEGATIVE
SARS Coronavirus 2 by RT PCR: NEGATIVE

## 2020-12-04 LAB — BASIC METABOLIC PANEL
Anion gap: 9 (ref 5–15)
BUN: 24 mg/dL — ABNORMAL HIGH (ref 8–23)
CO2: 19 mmol/L — ABNORMAL LOW (ref 22–32)
Calcium: 7.7 mg/dL — ABNORMAL LOW (ref 8.9–10.3)
Chloride: 101 mmol/L (ref 98–111)
Creatinine, Ser: 0.7 mg/dL (ref 0.44–1.00)
GFR, Estimated: >60 mL/min (ref >60)
Glucose, Bld: 101 mg/dL — ABNORMAL HIGH (ref 70–99)
Potassium: 4.5 mmol/L (ref 3.5–5.1)
Sodium: 129 mmol/L — ABNORMAL LOW (ref 135–145)

## 2020-12-04 LAB — CBC
HCT: 27.3 % — ABNORMAL LOW (ref 36.0–46.0)
Hemoglobin: 8.4 g/dL — ABNORMAL LOW (ref 12.0–15.0)
MCH: 31.5 pg (ref 26.0–34.0)
MCHC: 30.8 g/dL (ref 30.0–36.0)
MCV: 102.2 fL — ABNORMAL HIGH (ref 80.0–100.0)
Platelets: 103 10*3/uL — ABNORMAL LOW (ref 150–400)
RBC: 2.67 MIL/uL — ABNORMAL LOW (ref 3.87–5.11)
RDW: 15.4 % (ref 11.5–15.5)
WBC: 10.5 10*3/uL (ref 4.0–10.5)
nRBC: 0.2 % (ref 0.0–0.2)

## 2020-12-04 LAB — GLUCOSE, CAPILLARY
Glucose-Capillary: 114 mg/dL — ABNORMAL HIGH (ref 70–99)
Glucose-Capillary: 193 mg/dL — ABNORMAL HIGH (ref 70–99)
Glucose-Capillary: 142 mg/dL — ABNORMAL HIGH (ref 70–99)

## 2020-12-04 MED ORDER — SPIRONOLACTONE 25 MG PO TABS: 25.0000 mg | ORAL_TABLET | Freq: Every day | ORAL | Status: DC

## 2020-12-04 MED ORDER — FERROUS SULFATE 325 (65 FE) MG PO TABS: 325.0000 mg | ORAL_TABLET | Freq: Three times a day (TID) | ORAL | 0 refills | Status: DC

## 2020-12-04 MED ORDER — METHOCARBAMOL 500 MG PO TABS: 500.0000 mg | ORAL_TABLET | Freq: Four times a day (QID) | ORAL | 0 refills | Status: AC | PRN

## 2020-12-04 MED ORDER — DOCUSATE SODIUM 100 MG PO CAPS: 100.0000 mg | ORAL_CAPSULE | Freq: Two times a day (BID) | ORAL | 0 refills | Status: DC

## 2020-12-04 MED ORDER — HYDROCODONE-ACETAMINOPHEN 5-325 MG PO TABS: 1.0000 | ORAL_TABLET | Freq: Four times a day (QID) | ORAL | 0 refills | Status: AC | PRN

## 2020-12-04 MED ORDER — INSULIN ASPART 100 UNIT/ML IJ SOLN
0.0000 [IU] | Freq: Three times a day (TID) | INTRAMUSCULAR | Status: DC
  Administered 2020-12-04: 3 [IU] via SUBCUTANEOUS
  Administered 2020-12-04: 1 [IU] via SUBCUTANEOUS

## 2020-12-04 MED ORDER — SPIRONOLACTONE 12.5 MG HALF TABLET: 12.5000 mg | ORAL_TABLET | Freq: Every day | ORAL | Status: DC

## 2020-12-04 MED ORDER — POLYETHYLENE GLYCOL 3350 17 G PO PACK: 17.0000 g | PACK | Freq: Every day | ORAL | 0 refills | Status: AC | PRN

## 2020-12-04 MED ORDER — ONDANSETRON HCL 4 MG PO TABS: 4.0000 mg | ORAL_TABLET | Freq: Four times a day (QID) | ORAL | 0 refills | Status: AC | PRN

## 2020-12-04 NOTE — TOC Transition Note (Signed)
Transition of Care [facility]) - CM/SW Discharge Note  Patient Details  Name: Cynthia Frank MRN: [id number] Date of Birth: Jun 17, 1935  Transition of Care [facility]) CM/SW Contact:  Sherie Don, LCSW Phone Number: 12/04/2020, 1:11 PM  Clinical Narrative: CSW provided bed offers to patient and family. Family requested [facility], which did not make a bed offer. CSW confirmed with Whitney in admissions at [facility] that there are no beds available at this time. Family selected [facility]. CSW confirmed with Lexine Baton at [facility] that a bed is available. Nikki confirmed COVID vaccination status. Patient will go to room 506 and the number for report is [phone].  Discharge summary, discharge orders, and SNF transfer report faxed to facility in hub. COVID test is negative. Medical necessity form done; PTAR scheduled. Discharge packet complete. Family to complete admission paperwork at [facility]. RN updated. TOC signing off.  Final next level of care: Skilled Nursing Facility Barriers to Discharge: Barriers Resolved  Patient Goals and CMS Choice Patient states their goals for this hospitalization and ongoing recovery are:: Discharge to short-term rehab CMS Medicare.gov Compare Post Acute Care list provided to:: Patient Choice offered to / list presented to : Patient, Spouse, Adult Children  Discharge Placement Existing PASRR number confirmed : 12/04/20          Patient chooses bed at: [facility] Patient to be transferred to facility by: [place] Name of family member notified: Diffee Voros (husband), daughter Patient and family notified of of transfer: 12/04/20  Discharge Plan and Services In-house Referral: Clinical Social Work Post Acute Care Choice: [place]          DME Arranged: N/A DME Agency: NA  Readmission Risk Interventions Readmission Risk Prevention Plan 12/03/2020  PCP or Specialist Appt within 3-5 Days Not Complete  Not Complete comments Patient  will discharge to SNF.  [place] or Home Care Consult Complete  Social Work Consult for [place] Planning/Counseling Complete  Palliative Care Screening Not Applicable  Some recent data might be hidden

## 2020-12-04 NOTE — Progress Notes (Signed)
Subjective: 2 Days Post-Op Procedure(s) (LRB): TOTAL HIP ARTHROPLASTY ANTERIOR APPROACH (Left) Patient reports pain as mild.   Patient seen in rounds for Dr. Alvan Dame. Patient is up in the recliner this morning. She reports she is feeling well. She is agreeable to SNF, and social work is working on placement. Ambulated 20 feet with PT yesterday.  We will continue therapy today.   Objective: Vital signs in last 24 hours: Temp:  [97.6 F (36.4 C)-98 F (36.7 C)] 98 F (36.7 C) (08/19 0547) Pulse Rate:  [73-86] 73 (08/19 0547) Resp:  [15-17] 17 (08/19 0547) BP: (94-103)/(61-75) 94/66 (08/19 0547) SpO2:  [96 %-99 %] 96 % (08/19 0547)  Intake/Output from previous day:  Intake/Output Summary (Last 24 hours) at 12/04/2020 0907 Last data filed at 12/04/2020 [id number] Gross per 24 hour  Intake 2240.28 ml  Output 400 ml  Net 1840.28 ml     Intake/Output this shift: Total I/O In: 50 [P.O.:50] Out: -   Labs: Recent Labs    12/02/20 0324 12/02/20 1058 12/02/20 1929 12/03/20 0352 12/04/20 0320  HGB 12.2 12.1 11.2* 9.3* 8.4*   Recent Labs    12/03/20 0352 12/04/20 0320  WBC 9.0 10.5  RBC 2.96* 2.67*  HCT 29.2* 27.3*  PLT 118* 103*   Recent Labs    12/03/20 0352 12/04/20 0320  NA 131* 129*  K 4.2 4.5  CL 101 101  CO2 21* 19*  BUN 20 24*  CREATININE 0.89 0.70  GLUCOSE 126* 101*  CALCIUM 7.9* 7.7*   Recent Labs    12/02/20 0324 12/02/20 1058  INR 1.3* 1.4*    Exam: General - Patient is Alert and Oriented Extremity - Neurologically intact Sensation intact distally Intact pulses distally Dorsiflexion/Plantar flexion intact Dressing - dressing C/D/I Motor Function - intact, moving foot and toes well on exam.   Past Medical History:  Diagnosis Date   AICD (automatic cardioverter/defibrillator) present    Arthritis    "left ankle" (07/14/2015)   Atrial fibrillation (HCC)    Cataract    bilateral -not a surgical candidate   CHF (congestive heart failure)  ([place])    Colon cancer ([place]) 03/2006   T3, N0   Colon polyps 12/07/2010   Coronary artery disease    nonobstructive with 30% D1   Diabetes mellitus without complication (HCC)    on meds   GERD (gastroesophageal reflux disease)    OTC PRN -diet controlled   History of ovarian cancer 1985   Hx: UTI (urinary tract infection)    Hyperlipidemia    on meds   Hypertension    on meds   Internal hemorrhoid    Nonischemic cardiomyopathy ([place])    last EF assessment 40% by MUGA   Osteoporosis    Prolia once per year   Partial bowel obstruction (HCC)    PVC (premature ventricular contraction)    S/P mitral valve clip implantation 07/23/2020   s/p TEER with a  MitraClip G4 NTW device positioned A2/P2 by Dr. Burt Knack   Thyroid disease    on meds    Assessment/Plan: 2 Days Post-Op Procedure(s) (LRB): TOTAL HIP ARTHROPLASTY ANTERIOR APPROACH (Left) Active Problems:   Hyponatremia   Hip fracture (HCC)   S/P left total hip arthroplasty   Acute blood loss anemia   Thrombocytopenia (HCC)  Estimated body mass index is 21.19 kg/m as calculated from the following:   Height as of this encounter: '5\' 4"'$  (1.626 m).   Weight as of this encounter: 56 kg. Advance  diet Up with therapy  DVT Prophylaxis -  Eliquis PWB 50% LLE  ABLA: Hgb stable at 8.4 this AM. Continue iron.   Plan is to go Skilled nursing facility after hospital stay. Awaiting placement.   Plan to get up with PT today. Patient may discharge when placement obtained and when stable per Cardiology/medicine teams. I appreciate the assistance of both teams in caring for Cynthia Frank. Please advise if any recommendations for altering home medications.   Otherwise, prescriptions printed and in her chart.   Griffith Citron, PA-C Orthopedic Surgery [phone] 12/04/2020, 9:07 AM

## 2020-12-04 NOTE — Progress Notes (Signed)
Called report to Marcelle Smiling, LPN at [facility].

## 2020-12-04 NOTE — Plan of Care (Signed)
  Problem: Pain Managment: Goal: General experience of comfort will improve Outcome: Progressing   Problem: Safety: Goal: Ability to remain free from injury will improve Outcome: Progressing   

## 2020-12-04 NOTE — Progress Notes (Signed)
Progress Note  Patient Name: Cynthia Frank Date of Encounter: 12/04/2020  [facility] Cardiologist: [name], MD   Subjective   Walking hallway, no shortness of breath no chest pain  Inpatient Medications    Scheduled Meds:  apixaban  2.5 mg Oral Q12H   atorvastatin  40 mg Oral Daily   citalopram  20 mg Oral Daily   docusate sodium  100 mg Oral BID   feeding supplement  237 mL Oral BID BM   ferrous sulfate  325 mg Oral TID PC   furosemide  20 mg Oral Daily   gatifloxacin  1 drop Left Eye TID   insulin aspart  0-9 Units Subcutaneous TID WC & HS   ipratropium  1 spray Each Nare BID   levothyroxine  25 mcg Oral Q0600   multivitamin with minerals  1 tablet Oral Daily   potassium chloride SA  20 mEq Oral Daily   prednisoLONE acetate  1 drop Left Eye TID   spironolactone  25 mg Oral Daily   Continuous Infusions:  methocarbamol (ROBAXIN) IV     PRN Meds: acetaminophen, bisacodyl, diphenhydrAMINE, HYDROcodone-acetaminophen, HYDROcodone-acetaminophen, menthol-cetylpyridinium **OR** phenol, methocarbamol **OR** methocarbamol (ROBAXIN) IV, metoCLOPramide **OR** metoCLOPramide (REGLAN) injection, morphine injection, ondansetron **OR** ondansetron (ZOFRAN) IV, polyethylene glycol, zolpidem   Vital Signs    Vitals:   12/03/20 1339 12/03/20 1340 12/03/20 2022 12/04/20 0547  BP: 101/67 94/69 103/61 94/66  Pulse: 82  86 73  Resp: '16  17 17  '$ Temp: 97.6 F (36.4 C)  97.9 F (36.6 C) 98 F (36.7 C)  TempSrc:   Oral Oral  SpO2: 99%  96% 96%  Weight:      Height:        Intake/Output Summary (Last 24 hours) at 12/04/2020 0859 Last data filed at 12/04/2020 0852 Gross per 24 hour  Intake 2340.28 ml  Output 400 ml  Net 1940.28 ml   Last 3 Weights 12/02/2020 12/02/2020 11/24/2020  Weight (lbs) 123 lb 7.3 oz 123 lb 7.3 oz 121 lb 3.2 oz  Weight (kg) 56 kg 56 kg 54.976 kg     ECG    No new tracings - Personally Reviewed  Physical Exam   GEN: No acute distress.    Neck: No JVD Cardiac: RRR, 1/6 systolic murmur, norubs, or gallops.  Respiratory: Clear to auscultation bilaterally. GI: Soft, nontender, non-distended  MS: No edema; No deformity. Neuro:  Nonfocal  Psych: Normal affect   Labs    High Sensitivity Troponin:  No results for input(s): TROPONINIHS in the last 720 hours.    Chemistry Recent Labs  Lab 12/02/20 0324 12/02/20 1058 12/02/20 1929 12/03/20 0352 12/04/20 0320  NA 131* 132* 132* 131* 129*  K 4.7 4.5 4.8 4.2 4.5  CL 101 99  --  101 101  CO2 22 23  --  21* 19*  GLUCOSE 118* 119*  --  126* 101*  BUN 19 21  --  20 24*  CREATININE 0.86 0.93  --  0.89 0.70  CALCIUM 8.5* 8.5*  --  7.9* 7.7*  PROT 6.6 6.6  --   --   --   ALBUMIN 3.5 3.4*  --   --   --   AST 29 28  --   --   --   ALT 43 41  --   --   --   ALKPHOS 66 63  --   --   --   BILITOT 2.4* 2.5*  --   --   --  GFRNONAA >60 >60  --  >60 >60  ANIONGAP 8 10  --  9 9     Hematology Recent Labs  Lab 12/02/20 1058 12/02/20 1929 12/03/20 0352 12/04/20 0320  WBC 5.6  --  9.0 10.5  RBC 3.84*  --  2.96* 2.67*  HGB 12.1 11.2* 9.3* 8.4*  HCT 37.1 33.0* 29.2* 27.3*  MCV 96.6  --  98.6 102.2*  MCH 31.5  --  31.4 31.5  MCHC 32.6  --  31.8 30.8  RDW 14.6  --  14.6 15.4  PLT 132*  --  118* 103*    BNPNo results for input(s): BNP, PROBNP in the last 168 hours.   DDimer No results for input(s): DDIMER in the last 168 hours.   Radiology    DG Pelvis Portable  Result Date: 12/02/2020 CLINICAL DATA:  Status post left hip replacement EXAM: PORTABLE PELVIS 1 VIEWS COMPARISON:  Film from earlier in the same day. FINDINGS: Left hip replacement is now seen new from the prior exam. Stable right hip replacement is seen. Pelvic ring is intact. No bony or soft tissue abnormality is noted. IMPRESSION: Status post left hip replacement Electronically Signed   By: Inez Catalina M.D.   On: 12/02/2020 20:46   DG C-Arm 1-60 Min-No Report  Result Date: 12/02/2020 Fluoroscopy was  utilized by the requesting physician.  No radiographic interpretation.   DG HIP OPERATIVE UNILAT WITH PELVIS LEFT  Result Date: 12/02/2020 CLINICAL DATA:  Hip replacement EXAM: OPERATIVE left HIP (WITH PELVIS IF PERFORMED) 2 VIEWS TECHNIQUE: Fluoroscopic spot image(s) were submitted for interpretation post-operatively. COMPARISON:  12/02/2020 FINDINGS: Two low resolution intraoperative spot views of the left hip. Total fluoroscopy time was 6 seconds. The images demonstrate a left hip replacement. IMPRESSION: Intraoperative fluoroscopic assistance provided during left hip replacement Electronically Signed   By: Donavan Foil M.D.   On: 12/02/2020 19:52    Cardiac Studies   Right Heart Catheterization 07/22/2020: 1. Low to normal filling pressures.  2. Preserved cardiac output.    Filling pressures optimized after aggressive diuresis.  Will stop IV Lasix today and will give a small amt of NS with RA pressure 1.  _______________   Echocardiogram 08/27/2020: Impressions: 1. Left ventricular ejection fraction, by estimation, is 20 to 25%. The  left ventricle has severely decreased function. The left ventricle  demonstrates global hypokinesis. The left ventricular internal cavity size  was mildly dilated. Left ventricular  diastolic parameters are indeterminate.   2. Right ventricular systolic function is moderately reduced. The right  ventricular size is normal. There is mildly elevated pulmonary artery  systolic pressure. The estimated right ventricular systolic pressure is  [id number] mmHg.   3. Left atrial size was mildly dilated.   4. There is a Mitra-Clip present in the A2-P2 position. Trivial mitral  valve regurgitation. No evidence of mitral stenosis. MG 8mHg at HR 111  bpm.   5. The aortic valve is tricuspid. Aortic valve regurgitation is mild.  Mild aortic valve sclerosis is present, with no evidence of aortic valve  stenosis.   6. The inferior vena cava is normal in size with greater  than 50%  respiratory variability, suggesting right atrial pressure of 3 mmHg.   7. Evidence of atrial level shunting detected by color flow Doppler.  There is a small atrial septal defect with predominantly left to right  shunting across the atrial septum.   Patient Profile     85y.o. female  with a history of coronary  calcifications noted on CT scan in 07/2019, non-ischemic cardiomyopathy/chronic systolic CHF with EF of 0000000, s/p ICD, paroxysmal atrial fibrillation/flutter s/p atrial fibrillation ablation in 07/2019 and then redo ablation in 06/2020 and then DCCV for atrial flutter on 11/18/2020, mitral regurgitation s/p MitraClip in 07/2020, hypertension, hyperlipidemia, diabetes mellitus, GERD, who is being followed by cardiology for pre-op evaluation for hip fracture  Assessment & Plan    1.  Left hip fracture: patient presented after a fall resulting in a left hip fracture. Seen for preop evaluation 12/02/20 and found to be an acceptable risk for surgery. She was noted to be at increased risk for cardiac thrombus/CVA given recent DCCV 11/18/20, however delaying hip fracture repair would only increase morbidity/mortality so she was cleared to hold eliquis. Patient underwent hip fracture repair 0000000 without complication. Continues to maintain sinus rhythm. Eliquis restarted 8/18.  - Continue post-op care per ortho/primary team   2. Non-Ischemic Cardiomyopathy/Chronic Systolic CHF s/p [employer] ICD: Echo in 08/2020 showed LVEF of 20-25% with global hypokinesis, moderate RV dysfunction, mild AI, and trial MR with MitraClip present. Appears euvolemic on exam. Home Lasix '40mg'$  daily and Spironolactone '25mg'$  daily were held on admission. Has not been able to tolerate Toprol due to excessive fatigue. No longer on Entresto given syncope and lightheadedness while taking. Lasix restarted at lower dose yesterday - Continue Lasix '20mg'$  daily, reduced dosage.  Watch for hypotension. -Discontinue spironolactone 25 mg,  blood pressure too soft at this time. - Continue home Farxiga '10mg'$  at discharge. - Monitor volume status closely perioperatively.    3. Paroxysmal Atrial Fibrillation/Flutter: S/p atrial fibrillation ablation in 07/2019 and 06/2020. S/p DCCV on 11/18/2020 for atrial flutter. Looks to be maintaining sinus rhythm. Not on any AV nodal agents. Eliquis held for hip fx repair and restarted this morning - Continue eliquis for stroke ppx - dose adjusted for age and weight   4. Non-Obstructive CAD: Non-obstructive CAD noted on cath in 2001. Coronary calcifications seen on CT in 07/2019. No angina. No aspirin given she is on Eliquis.  - Continue Lipitor '40mg'$  daily.   5. Possible Syncope: Possibly syncope that led to fall. Patient does have a history of syncope secondary to hypotension/orthostatic hypotension but this is usually preceded by lightheadedness. Denies any prodromal symptoms prior to this event. Recent Echo in 08/2020 as above. BP does run soft. Unclear what patient's BP was at the time of the events. Unable to see records from hospital at [facility]. Device interrogation yesterday was reassuring - no significant arrhythmias since recent DCCV. - Continue routine device monitoring outpatient - Lasix reduced as above to minimize orthostatic hypotension   6. Hyperlipidemia: LDL 52 11/2019 - Continue home Lipitor '40mg'$  daily.   Otherwise, per primary team: - Hip fracture - Type 2 diabetes  - Hypothyroidism - IBS - GERD   For questions or updates, please contact [facility] Please consult [email or web address] for contact info under        Signed, Abigail Butts, PA-C  12/04/2020, 8:59 AM    Personally seen and examined. Agree with above.  Okay with discharge to skilled nursing facility for further rehabilitation efforts.  We have stopped spironolactone 25 mg.  Please change in discharge summary.  Walking the hallway.  No symptoms.  Candee Furbish, MD

## 2020-12-04 NOTE — Discharge Summary (Signed)
Physician Discharge Summary   Patient ID: Cynthia Frank MRN: [id number] DOB/AGE: 1936/01/03 85 y.o.  Admit date: 12/02/2020 Discharge date:   Primary Diagnosis: Left displaced femoral neck fracture  Admission Diagnoses:  Past Medical History:  Diagnosis Date   AICD (automatic cardioverter/defibrillator) present    Arthritis    "left ankle" (07/14/2015)   Atrial fibrillation (HCC)    Cataract    bilateral -not a surgical candidate   CHF (congestive heart failure) ([place])    Colon cancer ([place]) 03/2006   T3, N0   Colon polyps 12/07/2010   Coronary artery disease    nonobstructive with 30% D1   Diabetes mellitus without complication ([place])    on meds   GERD (gastroesophageal reflux disease)    OTC PRN -diet controlled   History of ovarian cancer 1985   Hx: UTI (urinary tract infection)    Hyperlipidemia    on meds   Hypertension    on meds   Internal hemorrhoid    Nonischemic cardiomyopathy ([place])    last EF assessment 40% by MUGA   Osteoporosis    Prolia once per year   Partial bowel obstruction (HCC)    PVC (premature ventricular contraction)    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 Burt Knack   Thyroid disease    on meds   Discharge Diagnoses:   Active Problems:   Hyponatremia   Hip fracture (HCC)   S/P left total hip arthroplasty   Acute blood loss anemia   Thrombocytopenia (HCC)  Estimated body mass index is 21.19 kg/m as calculated from the following:   Height as of this encounter: '5\' 4"'$  (1.626 m).   Weight as of this encounter: 56 kg.  Procedure:  Procedure(s) (LRB): TOTAL HIP ARTHROPLASTY ANTERIOR APPROACH (Left)   Consults: None  HPI:  Cynthia Frank is a 85 y.o. female who had a ground level fall while in [facility].  She was found to have a displaced left femoral neck fracture.  She requested to be transferred to [place] for management.   She was directly admitted for  management.  We reviewed the fracture pattern and the similarities to her right hip fracture that necessitated total hip replacement.  We reviewed the indications and indications for hip replacement in setting of this acute injury and past history of hip osteoarthritis. Consent was obtained for pain management of her femoral neck fracture.who had   presented to office for evaluation of left hip pain.  Specific risks of infection, DVT, component failure, dislocation, neurovascular injury, and need for revision surgery were reviewed.     Laboratory Data: Admission on 12/02/2020  Component Date Value Ref Range Status   Prothrombin Time 12/02/2020 16.2 (A) 11.4 - 15.2 seconds Final   INR 12/02/2020 1.3 (A) 0.8 - 1.2 Final   Comment: (NOTE) INR goal varies based on device and disease states. Performed at [facility], [place] [address], [place], [place] [address]    WBC 12/02/2020 6.2  4.0 - 10.5 K/uL Final   RBC 12/02/2020 3.94  3.87 - 5.11 MIL/uL Final   Hemoglobin 12/02/2020 12.2  12.0 - 15.0 g/dL Final   HCT 12/02/2020 37.3  36.0 - 46.0 % Final   MCV 12/02/2020 94.7  80.0 - 100.0 fL Final   MCH 12/02/2020 31.0  26.0 - 34.0 pg Final   MCHC 12/02/2020 32.7  30.0 - 36.0 g/dL Final  RDW 12/02/2020 14.5  11.5 - 15.5 % Final   Platelets 12/02/2020 132 (A) 150 - 400 K/uL Final   Comment: CONSISTENT WITH PREVIOUS RESULT REPEATED TO VERIFY    nRBC 12/02/2020 0.0  0.0 - 0.2 % Final   Neutrophils Relative % 12/02/2020 74  % Final   Neutro Abs 12/02/2020 4.6  1.7 - 7.7 K/uL Final   Lymphocytes Relative 12/02/2020 11  % Final   Lymphs Abs 12/02/2020 0.7  0.7 - 4.0 K/uL Final   Monocytes Relative 12/02/2020 14  % Final   Monocytes Absolute 12/02/2020 0.9  0.1 - 1.0 K/uL Final   Eosinophils Relative 12/02/2020 0  % Final   Eosinophils Absolute 12/02/2020 0.0  0.0 - 0.5 K/uL Final   Basophils Relative 12/02/2020 1  % Final   Basophils Absolute 12/02/2020 0.0  0.0 - 0.1 K/uL Final    Immature Granulocytes 12/02/2020 0  % Final   Abs Immature Granulocytes 12/02/2020 0.02  0.00 - 0.07 K/uL Final   Performed at [facility], [place] [address], [place], [place] [address]   Sodium 12/02/2020 131 (A) 135 - 145 mmol/L Final   Potassium 12/02/2020 4.7  3.5 - 5.1 mmol/L Final   Chloride 12/02/2020 101  98 - 111 mmol/L Final   CO2 12/02/2020 22  22 - 32 mmol/L Final   Glucose, Bld 12/02/2020 118 (A) 70 - 99 mg/dL Final   Glucose reference range applies only to samples taken after fasting for at least 8 hours.   BUN 12/02/2020 19  8 - 23 mg/dL Final   Creatinine, Ser 12/02/2020 0.86  0.44 - 1.00 mg/dL Final   Calcium 12/02/2020 8.5 (A) 8.9 - 10.3 mg/dL Final   Total Protein 12/02/2020 6.6  6.5 - 8.1 g/dL Final   Albumin 12/02/2020 3.5  3.5 - 5.0 g/dL Final   AST 12/02/2020 29  15 - 41 U/L Final   ALT 12/02/2020 43  0 - 44 U/L Final   Alkaline Phosphatase 12/02/2020 66  38 - 126 U/L Final   Total Bilirubin 12/02/2020 2.4 (A) 0.3 - 1.2 mg/dL Final   GFR, Estimated 12/02/2020 >60  >60 mL/min Final   Comment: (NOTE) Calculated using the CKD-EPI Creatinine Equation (2021)    Anion gap 12/02/2020 8  5 - 15 Final   Performed at [facility], [place] [address], [place], [place] [address]   MRSA, PCR 12/02/2020 NEGATIVE  NEGATIVE Final   Staphylococcus aureus 12/02/2020 NEGATIVE  NEGATIVE Final   Comment: (NOTE) The Xpert SA Assay (FDA approved for NASAL specimens in patients 30 years of age and older), is one component of a comprehensive surveillance program. It is not intended to diagnose infection nor to guide or monitor treatment. Performed at [facility], [place] [address] [place], [place] [address]    SARS Coronavirus 2 12/02/2020 NEGATIVE  NEGATIVE Final   Comment: (NOTE) SARS-CoV-2 target nucleic acids are NOT DETECTED.  The SARS-CoV-2 RNA is generally detectable in upper and lower respiratory specimens during the  acute phase of infection. Negative results do not preclude SARS-CoV-2 infection, do not rule out co-infections with other pathogens, and should not be used as the sole basis for treatment or other patient management decisions. Negative results must be combined with clinical observations, patient history, and epidemiological information. The expected result is Negative.  Fact Sheet for Patients: SugarRoll.be  Fact Sheet for Healthcare Providers: https://www.woods-mathews.com/  This test is not yet approved or cleared by the [place]  FDA and  has been authorized for detection and/or diagnosis of SARS-CoV-2 by FDA under an Emergency Use Authorization (EUA). This EUA will remain  in effect (meaning this test can be used) for the duration of the COVID-19 declaration under Se                          ction 564(b)(1) of the Act, 21 U.S.C. section 360bbb-3(b)(1), unless the authorization is terminated or revoked sooner.  Performed at [facility], [place] [address], [place], [place] [address]    WBC 12/02/2020 5.6  4.0 - 10.5 K/uL Final   RBC 12/02/2020 3.84 (A) 3.87 - 5.11 MIL/uL Final   Hemoglobin 12/02/2020 12.1  12.0 - 15.0 g/dL Final   HCT 12/02/2020 37.1  36.0 - 46.0 % Final   MCV 12/02/2020 96.6  80.0 - 100.0 fL Final   MCH 12/02/2020 31.5  26.0 - 34.0 pg Final   MCHC 12/02/2020 32.6  30.0 - 36.0 g/dL Final   RDW 12/02/2020 14.6  11.5 - 15.5 % Final   Platelets 12/02/2020 132 (A) 150 - 400 K/uL Final   nRBC 12/02/2020 0.0  0.0 - 0.2 % Final   Neutrophils Relative % 12/02/2020 75  % Final   Neutro Abs 12/02/2020 4.2  1.7 - 7.7 K/uL Final   Lymphocytes Relative 12/02/2020 8  % Final   Lymphs Abs 12/02/2020 0.5 (A) 0.7 - 4.0 K/uL Final   Monocytes Relative 12/02/2020 13  % Final   Monocytes Absolute 12/02/2020 0.8  0.1 - 1.0 K/uL Final   Eosinophils Relative 12/02/2020 2  % Final   Eosinophils Absolute 12/02/2020 0.1  0.0 - 0.5  K/uL Final   Basophils Relative 12/02/2020 1  % Final   Basophils Absolute 12/02/2020 0.0  0.0 - 0.1 K/uL Final   Immature Granulocytes 12/02/2020 1  % Final   Abs Immature Granulocytes 12/02/2020 0.03  0.00 - 0.07 K/uL Final   Performed at [facility], [place] [address] [place], [place] [address]   Sodium 12/02/2020 132 (A) 135 - 145 mmol/L Final   Potassium 12/02/2020 4.5  3.5 - 5.1 mmol/L Final   Chloride 12/02/2020 99  98 - 111 mmol/L Final   CO2 12/02/2020 23  22 - 32 mmol/L Final   Glucose, Bld 12/02/2020 119 (A) 70 - 99 mg/dL Final   Glucose reference range applies only to samples taken after fasting for at least 8 hours.   BUN 12/02/2020 21  8 - 23 mg/dL Final   Creatinine, Ser 12/02/2020 0.93  0.44 - 1.00 mg/dL Final   Calcium 12/02/2020 8.5 (A) 8.9 - 10.3 mg/dL Final   Total Protein 12/02/2020 6.6  6.5 - 8.1 g/dL Final   Albumin 12/02/2020 3.4 (A) 3.5 - 5.0 g/dL Final   AST 12/02/2020 28  15 - 41 U/L Final   ALT 12/02/2020 41  0 - 44 U/L Final   Alkaline Phosphatase 12/02/2020 63  38 - 126 U/L Final   Total Bilirubin 12/02/2020 2.5 (A) 0.3 - 1.2 mg/dL Final   GFR, Estimated 12/02/2020 >60  >60 mL/min Final   Comment: (NOTE) Calculated using the CKD-EPI Creatinine Equation (2021)    Anion gap 12/02/2020 10  5 - 15 Final   Performed at [facility], [place] [address] [place], [place] [address]   Prothrombin Time 12/02/2020 17.6 (A) 11.4 - 15.2 seconds Final   INR 12/02/2020 1.4 (A) 0.8 - 1.2 Final  Comment: (NOTE) INR goal varies based on device and disease states. Performed at [facility], [place] [address], [place], [place] [address]    Hgb A1c MFr Bld 12/02/2020 6.1 (A) 4.8 - 5.6 % Final   Comment: (NOTE) Pre diabetes:          5.7%-6.4%  Diabetes:              >6.4%  Glycemic control for   <7.0% adults with diabetes    Mean Plasma Glucose 12/02/2020 128.37  mg/dL Final   Performed at [facility],  [place] [address], [place], [place] [address]   Glucose-Capillary 12/02/2020 116 (A) 70 - 99 mg/dL Final   Glucose reference range applies only to samples taken after fasting for at least 8 hours.   Glucose-Capillary 12/02/2020 155 (A) 70 - 99 mg/dL Final   Glucose reference range applies only to samples taken after fasting for at least 8 hours.   ABO/RH(D) 12/02/2020 A POS   Final   Antibody Screen 12/02/2020 NEG   Final   Sample Expiration 12/02/2020 12/05/2020,2359   Final   Unit Number 12/02/2020 [id number]   Final   Blood Component Type 12/02/2020 RED CELLS,LR   Final   Unit division 12/02/2020 00   Final   Status of Unit 12/02/2020 ALLOCATED   Final   Transfusion Status 12/02/2020 OK TO TRANSFUSE   Final   Crossmatch Result 12/02/2020    Final                   Value:Compatible Performed at [facility], [place], [place] [address]    Unit Number 12/02/2020 [id number]   Final   Blood Component Type 12/02/2020 RED CELLS,LR   Final   Unit division 12/02/2020 00   Final   Status of Unit 12/02/2020 ALLOCATED   Final   Transfusion Status 12/02/2020 OK TO TRANSFUSE   Final   Crossmatch Result 12/02/2020 Compatible   Final   Order Confirmation 12/02/2020    Final                   Value:ORDER PROCESSED BY BLOOD BANK Performed at [facility], [place] [address], [place], [place] [address]    Blood Product Unit Number 12/02/2020 [id number]   Final   PRODUCT CODE 12/02/2020 [id number]   Final   Unit Type and Rh 12/02/2020 6200   Final   Blood Product Expiration Date 12/02/2020 [id number]   Final   Blood Product Unit Number 12/02/2020 [id number]   Final   PRODUCT CODE 12/02/2020 [id number]   Final   Unit Type and Rh 12/02/2020 6200   Final   Blood Product Expiration Date 12/02/2020 [id number]   Final   WBC 12/03/2020 9.0  4.0 - 10.5 K/uL Final   RBC 12/03/2020 2.96 (A) 3.87 - 5.11 MIL/uL Final   Hemoglobin 12/03/2020 9.3 (A) 12.0 -  15.0 g/dL Final   HCT 12/03/2020 29.2 (A) 36.0 - 46.0 % Final   MCV 12/03/2020 98.6  80.0 - 100.0 fL Final   MCH 12/03/2020 31.4  26.0 - 34.0 pg Final   MCHC 12/03/2020 31.8  30.0 - 36.0 g/dL Final   RDW 12/03/2020 14.6  11.5 - 15.5 % Final   Platelets 12/03/2020 118 (A) 150 - 400 K/uL Final   Comment: Immature Platelet Fraction may be clinically indicated, consider ordering this additional test [id number] CONSISTENT WITH PREVIOUS RESULT REPEATED TO VERIFY    nRBC 12/03/2020  0.0  0.0 - 0.2 % Final   Performed at [place] [address], [place], [place] [address]   Sodium 12/03/2020 131 (A) 135 - 145 mmol/L Final   Potassium 12/03/2020 4.2  3.5 - 5.1 mmol/L Final   Chloride 12/03/2020 101  98 - 111 mmol/L Final   CO2 12/03/2020 21 (A) 22 - 32 mmol/L Final   Glucose, Bld 12/03/2020 126 (A) 70 - 99 mg/dL Final   Glucose reference range applies only to samples taken after fasting for at least 8 hours.   BUN 12/03/2020 20  8 - 23 mg/dL Final   Creatinine, Ser 12/03/2020 0.89  0.44 - 1.00 mg/dL Final   Calcium 12/03/2020 7.9 (A) 8.9 - 10.3 mg/dL Final   GFR, Estimated 12/03/2020 >60  >60 mL/min Final   Comment: (NOTE) Calculated using the CKD-EPI Creatinine Equation (2021)    Anion gap 12/03/2020 9  5 - 15 Final   Performed at [facility], [place] [address], [place], [place] [address]   Glucose-Capillary 12/02/2020 137 (A) 70 - 99 mg/dL Final   Glucose reference range applies only to samples taken after fasting for at least 8 hours.   Glucose-Capillary 12/03/2020 131 (A) 70 - 99 mg/dL Final   Glucose reference range applies only to samples taken after fasting for at least 8 hours.   Comment 1 12/03/2020 Notify RN   Final   Comment 2 12/03/2020 Document in Chart   Final   pH, Arterial 12/02/2020 7.267 (A) 7.350 - 7.450 Final   pCO2 arterial 12/02/2020 45.8  32.0 - 48.0 mmHg Final   pO2, Arterial 12/02/2020 157 (A) 83.0 - 108.0 mmHg Final    Bicarbonate 12/02/2020 20.8  20.0 - 28.0 mmol/L Final   TCO2 12/02/2020 22  22 - 32 mmol/L Final   O2 Saturation 12/02/2020 99.0  % Final   Acid-base deficit 12/02/2020 6.0 (A) 0.0 - 2.0 mmol/L Final   Sodium 12/02/2020 132 (A) 135 - 145 mmol/L Final   Potassium 12/02/2020 4.8  3.5 - 5.1 mmol/L Final   Calcium, Ion 12/02/2020 1.13 (A) 1.15 - 1.40 mmol/L Final   HCT 12/02/2020 33.0 (A) 36.0 - 46.0 % Final   Hemoglobin 12/02/2020 11.2 (A) 12.0 - 15.0 g/dL Final   Patient temperature 12/02/2020 37.5 C   Final   Sample type 12/02/2020 ARTERIAL   Final   Glucose-Capillary 12/03/2020 106 (A) 70 - 99 mg/dL Final   Glucose reference range applies only to samples taken after fasting for at least 8 hours.   Glucose-Capillary 12/03/2020 175 (A) 70 - 99 mg/dL Final   Glucose reference range applies only to samples taken after fasting for at least 8 hours.   Glucose-Capillary 12/03/2020 177 (A) 70 - 99 mg/dL Final   Glucose reference range applies only to samples taken after fasting for at least 8 hours.   WBC 12/04/2020 10.5  4.0 - 10.5 K/uL Final   RBC 12/04/2020 2.67 (A) 3.87 - 5.11 MIL/uL Final   Hemoglobin 12/04/2020 8.4 (A) 12.0 - 15.0 g/dL Final   HCT 12/04/2020 27.3 (A) 36.0 - 46.0 % Final   MCV 12/04/2020 102.2 (A) 80.0 - 100.0 fL Final   MCH 12/04/2020 31.5  26.0 - 34.0 pg Final   MCHC 12/04/2020 30.8  30.0 - 36.0 g/dL Final   RDW 12/04/2020 15.4  11.5 - 15.5 % Final   Platelets 12/04/2020 103 (A) 150 - 400 K/uL Final   Comment: SPECIMEN CHECKED FOR CLOTS Immature Platelet  Fraction may be clinically indicated, consider ordering this additional test [id number] CONSISTENT WITH PREVIOUS RESULT REPEATED TO VERIFY    nRBC 12/04/2020 0.2  0.0 - 0.2 % Final   Performed at [facility], [place] [address], [place], [place] [address]   Sodium 12/04/2020 129 (A) 135 - 145 mmol/L Final   Potassium 12/04/2020 4.5  3.5 - 5.1 mmol/L Final   Chloride 12/04/2020 101  98 - 111 mmol/L  Final   CO2 12/04/2020 19 (A) 22 - 32 mmol/L Final   Glucose, Bld 12/04/2020 101 (A) 70 - 99 mg/dL Final   Glucose reference range applies only to samples taken after fasting for at least 8 hours.   BUN 12/04/2020 24 (A) 8 - 23 mg/dL Final   Creatinine, Ser 12/04/2020 0.70  0.44 - 1.00 mg/dL Final   Calcium 12/04/2020 7.7 (A) 8.9 - 10.3 mg/dL Final   GFR, Estimated 12/04/2020 >60  >60 mL/min Final   Comment: (NOTE) Calculated using the CKD-EPI Creatinine Equation (2021)    Anion gap 12/04/2020 9  5 - 15 Final   Performed at [facility], [place] [address] [place], [place] [address]   Glucose-Capillary 12/03/2020 125 (A) 70 - 99 mg/dL Final   Glucose reference range applies only to samples taken after fasting for at least 8 hours.   Glucose-Capillary 12/04/2020 114 (A) 70 - 99 mg/dL Final   Glucose reference range applies only to samples taken after fasting for at least 8 hours.   Glucose-Capillary 12/04/2020 193 (A) 70 - 99 mg/dL Final   Glucose reference range applies only to samples taken after fasting for at least 8 hours.  Hospital Outpatient Visit on 11/09/2020  Component Date Value Ref Range Status   Sodium 11/09/2020 134 (A) 135 - 145 mmol/L Final   Potassium 11/09/2020 3.3 (A) 3.5 - 5.1 mmol/L Final   Chloride 11/09/2020 97 (A) 98 - 111 mmol/L Final   CO2 11/09/2020 25  22 - 32 mmol/L Final   Glucose, Bld 11/09/2020 103 (A) 70 - 99 mg/dL Final   Glucose reference range applies only to samples taken after fasting for at least 8 hours.   BUN 11/09/2020 15  8 - 23 mg/dL Final   Creatinine, Ser 11/09/2020 1.02 (A) 0.44 - 1.00 mg/dL Final   Calcium 11/09/2020 9.3  8.9 - 10.3 mg/dL Final   Total Protein 11/09/2020 7.4  6.5 - 8.1 g/dL Final   Albumin 11/09/2020 3.8  3.5 - 5.0 g/dL Final   AST 11/09/2020 29  15 - 41 U/L Final   ALT 11/09/2020 24  0 - 44 U/L Final   Alkaline Phosphatase 11/09/2020 48  38 - 126 U/L Final   Total Bilirubin 11/09/2020 1.6 (A) 0.3 - 1.2  mg/dL Final   GFR, Estimated 11/09/2020 55 (A) >60 mL/min Final   Comment: (NOTE) Calculated using the CKD-EPI Creatinine Equation (2021)    Anion gap 11/09/2020 12  5 - 15 Final   Performed at [facility], [place] [address], [place], [place] [address]   TSH 11/09/2020 6.870 (A) 0.350 - 4.500 uIU/mL Final   Comment: Performed by a 3rd Generation assay with a functional sensitivity of <=0.01 uIU/mL. Performed at [facility], [place] [address] [place], [place] [address]    WBC 11/09/2020 5.2  4.0 - 10.5 K/uL Final   RBC 11/09/2020 4.31  3.87 - 5.11 MIL/uL Final   Hemoglobin 11/09/2020 13.7  12.0 - 15.0 g/dL Final   HCT  11/09/2020 41.3  36.0 - 46.0 % Final   MCV 11/09/2020 95.8  80.0 - 100.0 fL Final   MCH 11/09/2020 31.8  26.0 - 34.0 pg Final   MCHC 11/09/2020 33.2  30.0 - 36.0 g/dL Final   RDW 11/09/2020 14.2  11.5 - 15.5 % Final   Platelets 11/09/2020 131 (A) 150 - 400 K/uL Final   REPEATED TO VERIFY   nRBC 11/09/2020 0.0  0.0 - 0.2 % Final   Performed at [facility], [place] [address] [place], [place] [address]  Clinical Support on 10/27/2020  Component Date Value Ref Range Status   Date Time Interrogation Session 10/27/2020 [id number]   Final   Pulse Generator Manufacturer 10/27/2020 MERM   Final   Pulse Gen Model 10/27/2020 [id number] Hillery Aldo XT CRT-D   Final   Pulse Gen Serial Number 10/27/2020 [id number]   Final   Clinic Name 10/27/2020 [facility]   Final   Implantable Pulse Generator Type 10/27/2020 Cardiac Resynch Therapy Defibulator   Final   Implantable Pulse Generator Implan* 10/27/2020 [id number]   Final   Implantable Lead Manufacturer 10/27/2020 MERM   Final   Implantable Lead Model 10/27/2020 4598 Attain Performa S   Final   Implantable Lead Serial Number 10/27/2020 [id number]   Final   Implantable Lead Implant Date 10/27/2020 [id number]   Final   Implantable Lead Location Detail 1 10/27/2020 UNKNOWN   Final   Implantable Lead Location 10/27/2020  [id number]   Final   Implantable Lead Manufacturer 10/27/2020 MERM   Final   Implantable Lead Model 10/27/2020 5076 CapSureFix Novus   Final   Implantable Lead Serial Number 10/27/2020 [id number]   Final   Implantable Lead Implant Date 10/27/2020 [id number]   Final   Implantable Lead Location Detail 1 10/27/2020 APPENDAGE   Final   Implantable Lead Location 10/27/2020 [id number]   Final   Implantable Lead Manufacturer 10/27/2020 MERM   Final   Implantable Lead Model 10/27/2020 6935 Sprint Quattro Secure S   Final   Implantable Lead Serial Number 10/27/2020 [id number]   Final   Implantable Lead Implant Date 10/27/2020 [id number]   Final   Implantable Lead Location Detail 1 10/27/2020 APEX   Final   Implantable Lead Location 10/27/2020 [id number]   Final   Lead Channel Setting Sensing Sensi* 10/27/2020 0.3  mV Final   Lead Channel Setting Pacing Amplit* 10/27/2020 2  V Final   Lead Channel Setting Pacing Pulse * 10/27/2020 0.4  ms Final   Lead Channel Setting Pacing Amplit* 10/27/2020 2.5  V Final   Lead Channel Setting Pacing Pulse * 10/27/2020 0.4  ms Final   Lead Channel Setting Pacing Amplit* 10/27/2020 2.25  V Final   Lead Channel Setting Pacing Captur* 10/27/2020 Adaptive Capture   Final   Lead Channel Impedance Value 10/27/2020 513  ohm Final   Lead Channel Sensing Intrinsic Amp* 10/27/2020 1.25  mV Final   Lead Channel Sensing Intrinsic Amp* 10/27/2020 1.25  mV Final   Lead Channel Pacing Threshold Ampl* 10/27/2020 1  V Final   Lead Channel Pacing Threshold Puls* 10/27/2020 0.4  ms Final   Lead Channel Impedance Value 10/27/2020 361  ohm Final   Lead Channel Impedance Value 10/27/2020 285  ohm Final   Lead Channel Sensing Intrinsic Amp* 10/27/2020 13.625  mV Final   Lead Channel Sensing Intrinsic Amp* 10/27/2020 13.625  mV Final   Lead Channel Pacing Threshold Ampl* 10/27/2020 0.875  V Final   Lead Channel Pacing Threshold Puls*  10/27/2020 0.4  ms Final   HighPow Impedance 10/27/2020 65  ohm  Final   Lead Channel Impedance Value 10/27/2020 608  ohm Final   Lead Channel Impedance Value 10/27/2020 589  ohm Final   Lead Channel Impedance Value 10/27/2020 551  ohm Final   Lead Channel Impedance Value 10/27/2020 361  ohm Final   Lead Channel Impedance Value 10/27/2020 513  ohm Final   Lead Channel Impedance Value 10/27/2020 475  ohm Final   Lead Channel Impedance Value 10/27/2020 399  ohm Final   Lead Channel Impedance Value 10/27/2020 342  ohm Final   Lead Channel Impedance Value 10/27/2020 304  ohm Final   Lead Channel Impedance Value 10/27/2020 285  ohm Final   Lead Channel Pacing Threshold Ampl* 10/27/2020 1.125  V Final   Lead Channel Pacing Threshold Puls* 10/27/2020 0.4  ms Final   Battery Status 10/27/2020 OK   Final   Battery Remaining Longevity 10/27/2020 11  mo Final   Battery Voltage 10/27/2020 2.86  V Final   Brady Statistic RA Percent Paced 10/27/2020 5.5  % Final   Brady Statistic RV Percent Paced 10/27/2020 93.60  % Final   Brady Statistic AP VP Percent 10/27/2020 5.52  % Final   Brady Statistic AS VP Percent 10/27/2020 89.15  % Final   Brady Statistic AP VS Percent 10/27/2020 0.05  % Final   Brady Statistic AS VS Percent 10/27/2020 5.28  % Final     X-Rays:DG Pelvis Portable  Result Date: 12/02/2020 CLINICAL DATA:  Status post left hip replacement EXAM: PORTABLE PELVIS 1 VIEWS COMPARISON:  Film from earlier in the same day. FINDINGS: Left hip replacement is now seen new from the prior exam. Stable right hip replacement is seen. Pelvic ring is intact. No bony or soft tissue abnormality is noted. IMPRESSION: Status post left hip replacement Electronically Signed   By: Inez Catalina M.D.   On: 12/02/2020 20:46   DG Pelvis Portable  Result Date: 12/02/2020 CLINICAL DATA:  Left hip pain EXAM: PORTABLE PELVIS 1-2 VIEWS COMPARISON:  Pelvis radiographs 12/18/2018 FINDINGS: There is a mildly displaced fracture through the left femoral neck with proximal and mild lateral  displacement of the distal fragment. The femoral head remains seated within the acetabulum. Right hip arthroplasty hardware appears intact. The SI joints and symphysis pubis are intact. The soft tissues are unremarkable. IMPRESSION: Mildly displaced fracture of the left femoral neck as above. Electronically Signed   By: Valetta Mole M.D.   On: 12/02/2020 10:33   DG C-Arm 1-60 Min-No Report  Result Date: 12/02/2020 Fluoroscopy was utilized by the requesting physician.  No radiographic interpretation.   DG HIP OPERATIVE UNILAT WITH PELVIS LEFT  Result Date: 12/02/2020 CLINICAL DATA:  Hip replacement EXAM: OPERATIVE left HIP (WITH PELVIS IF PERFORMED) 2 VIEWS TECHNIQUE: Fluoroscopic spot image(s) were submitted for interpretation post-operatively. COMPARISON:  12/02/2020 FINDINGS: Two low resolution intraoperative spot views of the left hip. Total fluoroscopy time was 6 seconds. The images demonstrate a left hip replacement. IMPRESSION: Intraoperative fluoroscopic assistance provided during left hip replacement Electronically Signed   By: Donavan Foil M.D.   On: 12/02/2020 19:52    EKG: Orders placed or performed during the hospital encounter of 12/02/20   EKG 12-Lead   EKG 12-Lead   EKG 12-Lead   EKG 12-Lead     Hospital Course: SHAVANA CHEW is a 85 y.o. who was admitted to [facility] in transfer from [place] center for orthopaedic care.  They were brought to the operating room on 12/02/2020 and underwent Procedure(s): TOTAL HIP ARTHROPLASTY ANTERIOR APPROACH.  Patient tolerated the procedure well and was later transferred to the recovery room and then to the orthopaedic floor for postoperative care. They were given PO and IV analgesics for pain control following their surgery. They were given 24 hours of postoperative antibiotics of  Anti-infectives (From admission, onward)    Start     Dose/Rate Route Frequency Ordered Stop   12/03/20 0030  ceFAZolin (ANCEF) IVPB 2g/100  mL premix        2 g 200 mL/hr over 30 Minutes Intravenous Every 6 hours 12/02/20 2035 12/03/20 0637   12/02/20 1130  ceFAZolin (ANCEF) IVPB 2g/100 mL premix        2 g 200 mL/hr over 30 Minutes Intravenous On call to O.R. 12/02/20 1043 12/02/20 1827      and started on DVT prophylaxis in the form of  Eliquis .   PT and OT were ordered for total joint protocol. Discharge planning consulted to help with postop disposition and equipment needs. Patient had a good night on the evening of surgery. They started to get up OOB with therapy on POD #1. We determined she would need SNF placement for safe discharge. Continued to work with therapy into POD #2. Pt was seen during rounds on day two and was ready to go to SNF pending progress with therapy. Pt worked with therapy for one additional session and was meeting their goals. She was discharged to SNF later that day in stable condition.  Diet: Regular diet Activity: PWB Follow-up: in 2 weeks Disposition: Skilled nursing facility Discharged Condition: good  The above name paitient is to be admitted to SNF without the 3 midnight qualified hospital stay under the current Medicare waiver.   Allergies as of 12/04/2020       Reactions   Clindamycin/lincomycin Itching   Dramamine Ii [meclizine Hcl] Other (See Comments)   Hyperactivity   Pneumococcal Vaccines Itching, Swelling   Redness   Sulfonamide Derivatives Itching, Swelling        Medication List     STOP taking these medications    mupirocin ointment 2 % Commonly known as: BACTROBAN   spironolactone 25 MG tablet Commonly known as: ALDACTONE       TAKE these medications    amoxicillin 500 MG tablet Commonly known as: AMOXIL Take 4 tablets (2,000 mg total) by mouth once as needed for up to 1 dose (take 1 hour before dental procedure).   atorvastatin 40 MG tablet Commonly known as: LIPITOR TAKE 1 TABLET(40 MG) BY MOUTH DAILY AT 6 PM What changed: See the new instructions.    citalopram 20 MG tablet Commonly known as: CELEXA Take 20 mg by mouth daily.   CoQ10 200 MG Caps Take 200 mg by mouth daily.   docusate sodium 100 MG capsule Commonly known as: COLACE Take 1 capsule (100 mg total) by mouth 2 (two) times daily.   Eliquis 2.5 MG Tabs tablet Generic drug: apixaban TAKE 1 TABLET(2.5 MG) BY MOUTH TWICE DAILY What changed: See the new instructions.   Farxiga 10 MG Tabs tablet Generic drug: dapagliflozin propanediol TAKE 1 TABLET(10 MG) BY MOUTH DAILY BEFORE BREAKFAST What changed: See the new instructions.   ferrous sulfate 325 (65 FE) MG tablet Take 1 tablet (325 mg total) by mouth 3 (three) times daily after meals for 14 days.   furosemide 40 MG tablet Commonly known as: LASIX Take 1 tablet (  40 mg total) by mouth daily.   HYDROcodone-acetaminophen 5-325 MG tablet Commonly known as: NORCO/VICODIN Take 1-2 tablets by mouth every 6 (six) hours as needed for severe pain.   ipratropium 0.03 % nasal spray Commonly known as: ATROVENT Place 1 spray into both nostrils 2 (two) times daily.   levothyroxine 25 MCG tablet Commonly known as: SYNTHROID TAKE 1 TABLET(25 MCG) BY MOUTH DAILY BEFORE BREAKFAST What changed:  how much to take how to take this when to take this additional instructions   Magnesium Oxide 500 MG Caps Take 1,000 mg by mouth 2 (two) times daily.   methocarbamol 500 MG tablet Commonly known as: ROBAXIN Take 1 tablet (500 mg total) by mouth every 6 (six) hours as needed for muscle spasms.   moxifloxacin 0.5 % ophthalmic solution Commonly known as: VIGAMOX Place 1 drop into the left eye 3 (three) times daily.   ondansetron 4 MG tablet Commonly known as: ZOFRAN Take 1 tablet (4 mg total) by mouth every 6 (six) hours as needed for nausea or vomiting. What changed: when to take this   polyethylene glycol 17 g packet Commonly known as: MIRALAX / GLYCOLAX Take 17 g by mouth daily as needed for mild constipation.    potassium chloride SA 20 MEQ tablet Commonly known as: KLOR-CON Take 1 tablet (20 mEq total) by mouth daily.   prednisoLONE acetate 1 % ophthalmic suspension Commonly known as: PRED FORTE Place 1 drop into the left eye 3 (three) times daily.   zolpidem 5 MG tablet Commonly known as: AMBIEN Take 1 tablet (5 mg total) by mouth at bedtime as needed for sleep.        Contact information for follow-up providers     [facility] Follow up on 12/11/2020.   Specialty: Cardiology Why: Please arrive 15 minutes early for your 10am post-hospital cardiology appointment Contact information: [address] [id number] [place] [phone]             Contact information for after-discharge care     Destination     [facility] Preferred SNF .   Service: Skilled Nursing Contact information: [address] [place] [phone]                     Signed: Griffith Citron, PA-C Orthopedic Surgery 12/04/2020, 12:51 PM

## 2020-12-04 NOTE — Progress Notes (Signed)
Physical Therapy Treatment Patient Details Name: Cynthia Frank MRN: [id number] DOB: 1935/12/20 Today's Date: 12/04/2020    History of Present Illness 85 y.o. female admitted with L femoral neck fx 2* a fall, s/p THA-anterior approach on 12/02/20. PMH includes: HFrEF, DM, HTN, HLD, hypothyroid, ablation 07/15/20.    PT Comments    Pt ambulated 18'+10' with RW with seated rest break, distance limited by fatigue. Pt performed THA HEP with min assist. Pt puts forth good effort, she fatigues quickly.     Follow Up Recommendations  SNF;Supervision for mobility/OOB     Equipment Recommendations  None recommended by PT    Recommendations for Other Services       Precautions / Restrictions Precautions Precautions: Fall Precaution Comments: pt reports she has h/o "passing out spells" that occur with no warning (she stated cancer drugs damaged her heart which causes these spells) Restrictions Weight Bearing Restrictions: Yes LLE Weight Bearing: Partial weight bearing LLE Partial Weight Bearing Percentage or Pounds: 50%    Mobility  Bed Mobility               General bed mobility comments: up in recliner    Transfers Overall transfer level: Needs assistance Equipment used: Rolling walker (2 wheeled) Transfers: Sit to/from Stand Sit to Stand: Mod assist         General transfer comment: assist to rise, VCs hand placement  Ambulation/Gait Ambulation/Gait assistance: Min guard Gait Distance (Feet): 28 Feet (18' + 10' with seated rest) Assistive device: Rolling walker (2 wheeled) Gait Pattern/deviations: Step-to pattern;Decreased step length - left;Decreased step length - right Gait velocity: decr   General Gait Details: VCs sequencing and for PWB status, no loss of balance, distance limited by fatigue   Stairs             Wheelchair Mobility    Modified Rankin (Stroke Patients Only)       Balance Overall balance assessment: Needs assistance;History  of Falls Sitting-balance support: Feet supported;No upper extremity supported Sitting balance-Leahy Scale: Fair     Standing balance support: Bilateral upper extremity supported Standing balance-Leahy Scale: Poor Standing balance comment: relies on BUE support                            Cognition Arousal/Alertness: Awake/alert Behavior During Therapy: WFL for tasks assessed/performed Overall Cognitive Status: Within Functional Limits for tasks assessed                                        Exercises Total Joint Exercises Ankle Circles/Pumps: AROM;Both;10 reps;Supine Quad Sets: AROM;Left;5 reps;Supine Heel Slides: AAROM;Left;10 reps;Supine Hip ABduction/ADduction: AAROM;Left;10 reps;Supine Long Arc Quad: AROM;Left;5 reps;Seated    General Comments        Pertinent Vitals/Pain Pain Score: 5  Pain Location: L hip Pain Descriptors / Indicators: Aching Pain Intervention(s): Limited activity within patient's tolerance;Monitored during session;Premedicated before session;Ice applied    Home Living                      Prior Function            PT Goals (current goals can now be found in the care plan section) Acute Rehab PT Goals Patient Stated Goal: hoping to go to rehab then home; likes to read, cook, play bridge PT Goal Formulation: With patient/family Time For Goal Achievement:  12/10/20 Potential to Achieve Goals: Good Progress towards PT goals: Progressing toward goals    Frequency    Min 4X/week      PT Plan Current plan remains appropriate    Co-evaluation              AM-PAC PT "6 Clicks" Mobility   Outcome Measure  Help needed turning from your back to your side while in a flat bed without using bedrails?: A Lot Help needed moving from lying on your back to sitting on the side of a flat bed without using bedrails?: A Lot Help needed moving to and from a bed to a chair (including a wheelchair)?: A Lot Help  needed standing up from a chair using your arms (e.g., wheelchair or bedside chair)?: A Lot Help needed to walk in hospital room?: A Little Help needed climbing 3-5 steps with a railing? : A Lot 6 Click Score: 13    End of Session Equipment Utilized During Treatment: Gait belt Activity Tolerance: Patient limited by fatigue Patient left: in chair;with call bell/phone within reach;with chair alarm set Nurse Communication: Mobility status PT Visit Diagnosis: Difficulty in walking, not elsewhere classified (R26.2);Pain Pain - Right/Left: Left Pain - part of body: Hip     Time: 0920-0948 PT Time Calculation (min) (ACUTE ONLY): 28 min  Charges:  $Gait Training: 8-22 mins $Therapeutic Exercise: 8-22 mins                     Blondell Reveal Kistler PT 12/04/2020  Acute Rehabilitation Services Pager [phone] Office [phone]

## 2020-12-04 NOTE — Progress Notes (Signed)
PROGRESS NOTE  VENETTE LOGEMANN [id number] DOB: November 29, 1935 DOA: 12/02/2020 PCP: [name], MD  Brief History   85 year old woman PMH including PAF/flutter, status post multiple cardioversions last 8/3, complicated cardiac history, presented after an episode of syncope thought secondary to orthostatic hypotension resulting in left hip fracture.  Admitted under orthopedic service.  Hospitalist consulted for medical assistance.  A & P  Displaced femoral neck fracture left --Status post surgery 8/17.  Management per orthopedics.  ABLA --s/p surgery -Hgb slightly lower at 8.4, follow w/ repeat CBC in AM  Hyponatremia --asymptomatic, mild, chronic since 2020, f/u as an outpatient, on SSRI probably causing  Thrombocytopenia  --mild, chronic since 2017 and acute secondary to blood loss --CBC in AM  Syncope, non-Ischemic Cardiomyopathy, chronic Systolic CHF, s/p Medtronic ICD, persistent atrial fib/flutter s/p cardioversion 8/3, CAD --per cardiology may be vasovagal, low BP, meds adjusted --further recs and eval per cardiology   Hypothyroid --Continue Synthroid.   Type II DM:  Hold [place]. Remains stable. Continue SSI.  May resume Farxiga at discharge   Disposition Plan:  Discussion: appears medically stable but would check CBC in AM, disposition per orthopedics  DVT prophylaxis: apixaban (ELIQUIS) tablet 2.5 mg Start: 12/03/20 0800 SCDs Start: 12/02/20 2036 Place TED hose Start: 12/02/20 2036 SCDs Start: 12/02/20 0109 apixaban (ELIQUIS) tablet 2.5 mg     Code Status: Full Code Level of care: Med-Surg Family Communication: none today  [name], MD  [facility] Direct contact: see [email or web address] (further directions at bottom of note if needed) 7PM-7AM contact night coverage as at bottom of note 12/04/2020, 8:49 AM  LOS: 2 days    Interval History/Subjective  CC: f/u hip fx  No problems overnight No dizziness Pain ok  Objective   Vitals:   Vitals:   12/03/20 2022 12/04/20 0547  BP: 103/61 94/66  Pulse: 86 73  Resp: 17 17  Temp: 97.9 F (36.6 C) 98 F (36.7 C)  SpO2: 96% 96%    Exam: Physical Exam Vitals reviewed.  Constitutional:      Appearance: Normal appearance.     Comments: Sitting in chair  Cardiovascular:     Rate and Rhythm: Normal rate and regular rhythm.     Heart sounds: No murmur heard. Pulmonary:     Effort: Pulmonary effort is normal. No respiratory distress.     Breath sounds: Normal breath sounds. No wheezing or rales.  Neurological:     Mental Status: She is alert.  Psychiatric:        Mood and Affect: Mood normal.        Behavior: Behavior normal.    I have personally reviewed the labs and other data, making special note of:   Today's Data  CBG is stable Na 129 stable Hgb down to 8.4 Plts down to 103   Scheduled Meds:  apixaban  2.5 mg Oral Q12H   atorvastatin  40 mg Oral Daily   citalopram  20 mg Oral Daily   docusate sodium  100 mg Oral BID   feeding supplement  237 mL Oral BID BM   ferrous sulfate  325 mg Oral TID PC   furosemide  20 mg Oral Daily   gatifloxacin  1 drop Left Eye TID   insulin aspart  0-9 Units Subcutaneous TID WC & HS   ipratropium  1 spray Each Nare BID   levothyroxine  25 mcg Oral Q0600   multivitamin with minerals  1 tablet Oral Daily   potassium chloride SA  20 mEq Oral Daily   prednisoLONE acetate  1 drop Left Eye TID   spironolactone  25 mg Oral Daily   Continuous Infusions:  sodium chloride 75 mL/hr at 12/04/20 0034   methocarbamol (ROBAXIN) IV      Active Problems:   Hip fracture (HCC)   S/P left total hip arthroplasty   LOS: 2 days   How to contact the [facility] Attending or Consulting provider [place] or covering provider during after hours [place], for this patient?  Check the care team in [facility] and look for a) attending/consulting TRH provider listed and b) the [facility] team listed Log into [email or web address] and use [place]'s universal password  to access. If you do not have the password, please contact the hospital operator. Locate the [facility] provider you are looking for under [facility] and page to a number that you can be directly reached. If you still have difficulty reaching the provider, please page the [facility] (Director on Call) for the Hospitalists listed on amion for assistance.

## 2020-12-06 LAB — TYPE AND SCREEN
ABO/RH(D): A POS
Antibody Screen: NEGATIVE
Unit division: 0
Unit division: 0

## 2020-12-06 LAB — BPAM RBC
Blood Product Expiration Date: 202209082359
Blood Product Expiration Date: 202209082359
Unit Type and Rh: 6200
Unit Type and Rh: 6200

## 2020-12-07 DIAGNOSIS — R262 Difficulty in walking, not elsewhere classified: Secondary | ICD-10-CM | POA: Diagnosis not present

## 2020-12-07 DIAGNOSIS — S72092D Other fracture of head and neck of left femur, subsequent encounter for closed fracture with routine healing: Secondary | ICD-10-CM | POA: Diagnosis not present

## 2020-12-07 DIAGNOSIS — I5022 Chronic systolic (congestive) heart failure: Secondary | ICD-10-CM | POA: Diagnosis not present

## 2020-12-07 DIAGNOSIS — Z96642 Presence of left artificial hip joint: Secondary | ICD-10-CM | POA: Diagnosis not present

## 2020-12-08 ENCOUNTER — Other Ambulatory Visit: Payer: Self-pay | Admitting: Cardiology

## 2020-12-09 ENCOUNTER — Telehealth (HOSPITAL_COMMUNITY): Payer: Self-pay | Admitting: Vascular Surgery

## 2020-12-09 NOTE — Telephone Encounter (Signed)
Pt daughter left VM on scheduling line to can cel 8/26 PPT W/ APP/NP, PT is in rehab pt fell broke hip

## 2020-12-11 ENCOUNTER — Encounter (HOSPITAL_COMMUNITY): Payer: Medicare Other

## 2020-12-14 ENCOUNTER — Ambulatory Visit (INDEPENDENT_AMBULATORY_CARE_PROVIDER_SITE_OTHER): Payer: Medicare Other

## 2020-12-14 DIAGNOSIS — I5042 Chronic combined systolic (congestive) and diastolic (congestive) heart failure: Secondary | ICD-10-CM

## 2020-12-14 DIAGNOSIS — Z95810 Presence of automatic (implantable) cardiac defibrillator: Secondary | ICD-10-CM | POA: Diagnosis not present

## 2020-12-16 ENCOUNTER — Telehealth: Payer: Self-pay

## 2020-12-16 NOTE — Progress Notes (Signed)
EPIC Encounter for ICM Monitoring  Patient Name: Cynthia Frank is a 85 y.o. female Date: 12/16/2020 Primary Care Physican: [name], MD Primary Cardiologist: Aundra Dubin Electrophysiologist: Santina Evans Pacing:  54.9%       09/22/2020 Office Weight: 115 lbs    Since 03-Dec-2020 AT/AF 1787 Time in AT/AF 16.0 hr/day (66.7%) Longest AT/AF 25 minutes    Battery ERI 5 months           Attempted call to patient and unable to reach.  Transmission reviewed.   Total Hip surgery was completed on 12/02/2020 after a recent fall.  Per Dr Claris Gladden 11/09/2020 note patient scheduled for cardioversion 8/3 for flutter.     Opitvol thoracic impedance trending back close to baseline but was suggesting possible fluid accumulation starting 7/14.  Decreased impedance worse during hospitlization for hip surgery.   Message sent to device clinic triage 8/31 to review AT/AF.  Appears pt in rhythm after cardioversion but out of rhythm starting 8/22.    Prescribed: Furosemide 40 mg Take 1 tablet (40 mg total) by mouth daily Potassium 20 mEq take 1 tablet daily   Labs: 12/04/2020 Creatinine 0.70, BUN 24, Potassium 4.5, Sodium 129, GFR >60 12/03/2020 Creatinine 0.89, BUN 20, Potassium 4.2, Sodium 131, GFR >60  12/02/2020 Creatinine 0.93, BUN 21, Potassium 4.5, Sodium 132, GFR >60  11/09/2020 Creatinine 1.02, BUN 15, Potassium 3.3, Sodium 134, GFR 55  09/08/2020 Creatinine 0.98, BUN 11, Potassium 3.5, Sodium 131, GFR 58 07/27/2020 Creatinine 1.19, BUN 18, Potassium 5.1, Sodium 127, GFR 46 07/24/2020 Creatinine 0.93, BUN 16, Potassium 3.6, Sodium 129, GFR >60 07/22/2020 Creatinine 1.30, BUN 18, Potassium 3.4, Sodium 131, GFR 41 07/21/2020 Creatinine 1.24, BUN 20, Potassium 3.8, Sodium 127, GFR 43 A complete set of results can be found in Results Review.   Recommendations: Unable to reach.     Follow-up plan: ICM clinic phone appointment on 12/22/2020 to recheck fluid levels.   91 day device clinic  remote transmission 01/26/2021.     EP/Cardiology Office Visits:  None scheduled.  Last EP visit was 11/03/2020 with Dr Curt Bears.   Copy of ICM check sent to Dr. Lovena Le.    3 month ICM trend: 12/14/2020.    1 Year ICM trend:       Rosalene Billings, RN 12/16/2020 10:30 AM

## 2020-12-16 NOTE — Telephone Encounter (Signed)
I do not think that she is going to stay in NSR without amiodarone.  Will try low dose amiodarone and see if her LFTs will tolerate this.  Start amiodarone 200 mg once daily and will aim for DCCV after a couple of weeks on amiodarone. Will follow LFTs closely, eventually try to decrease amiodarone to 100 mg daily.

## 2020-12-16 NOTE — Telephone Encounter (Signed)
Remote ICM transmission received.  Attempted call to patient on home and cell phone numbers regarding ICM remote transmission and no answer.  No message left.

## 2020-12-16 NOTE — Telephone Encounter (Signed)
12/14/20 transmission reviewed. AT/AF burden 99.7%. Presenting EGM AF/BP. Patient currently at [facility] s/p total hip arthroplasty 12/02/20 secondary to fall. Manual transmission sent by facility nursing staff. Today's presenting EGM AS/BP. Patient states to facility RN she was unaware of AF episodes. Medications reviewed. Patient continues to take [facility]. Will continue to monitor PAF episodes.

## 2020-12-16 NOTE — Telephone Encounter (Signed)
-----   Message from Rosalene Billings, RN sent at 12/16/2020 10:41 AM EDT ----- Regarding: AT/AF Would you mind reviewing [facility] 8/29 report (the first one sent that day) for AT/AF episodes. She had a cardioversion by Dr Aundra Dubin on 8/3 for atrial flutter but appears to be out of rhythm starting 8/22.  Wasn't sure if this needed to be addressed with Dr Curt Bears.  She saw [facility] 7/19.  I think she used to be [facility] patient which is what we listed as the billing EP.    Thanks! Margarita Grizzle

## 2020-12-17 DIAGNOSIS — K219 Gastro-esophageal reflux disease without esophagitis: Secondary | ICD-10-CM | POA: Diagnosis not present

## 2020-12-17 DIAGNOSIS — R262 Difficulty in walking, not elsewhere classified: Secondary | ICD-10-CM | POA: Diagnosis not present

## 2020-12-17 DIAGNOSIS — Z96642 Presence of left artificial hip joint: Secondary | ICD-10-CM | POA: Diagnosis not present

## 2020-12-17 DIAGNOSIS — S72092D Other fracture of head and neck of left femur, subsequent encounter for closed fracture with routine healing: Secondary | ICD-10-CM | POA: Diagnosis not present

## 2020-12-18 MED ORDER — AMIODARONE HCL 200 MG PO TABS: 200.0000 mg | ORAL_TABLET | Freq: Every day | ORAL | 3 refills | Status: AC

## 2020-12-18 NOTE — Addendum Note (Signed)
Addended by: Scarlette Calico on: 12/18/2020 04:58 PM   Modules accepted: Orders

## 2020-12-18 NOTE — Telephone Encounter (Signed)
Spoke w/RN at [employer] [employer], gave orders to start Amio 200 mg Daily, she states pt is working with PT well and has no complaints, will start Amio, she ask we call back next week and speak with schedulers to arrange transportation for pt to come to appt

## 2020-12-22 ENCOUNTER — Telehealth: Payer: Self-pay | Admitting: Internal Medicine

## 2020-12-22 ENCOUNTER — Other Ambulatory Visit: Payer: Self-pay | Admitting: *Deleted

## 2020-12-22 ENCOUNTER — Ambulatory Visit (INDEPENDENT_AMBULATORY_CARE_PROVIDER_SITE_OTHER): Payer: Medicare Other

## 2020-12-22 DIAGNOSIS — Z95810 Presence of automatic (implantable) cardiac defibrillator: Secondary | ICD-10-CM

## 2020-12-22 DIAGNOSIS — I5042 Chronic combined systolic (congestive) and diastolic (congestive) heart failure: Secondary | ICD-10-CM

## 2020-12-22 NOTE — Telephone Encounter (Signed)
Left message for patient to call me back at [phone] to schedule Medicare Annual Wellness Visit   Last AWV  02/01/19  Please schedule at anytime with [facility] if patient calls the office back.    40 Minutes appointment   Any questions, please call me at [phone]

## 2020-12-22 NOTE — Patient Outreach (Signed)
Member screened for potential [facility] care coordination needs. Per [place] (Patient Pear[place]tha [place]) member resides in [place] [place].  Communication sent to [facility] to make aware writer is following for transition plans and potential [facility] services.   Mrs. Schorr's PCP office has [facility] embedded care coordination team.   Will continue to follow.  Marthenia Rolling, MSN, RN,BSN [place] Acute Care Coordinator [phone] [facility]) [phone]  (Toll free office)

## 2020-12-22 NOTE — Progress Notes (Signed)
EPIC Encounter for ICM Monitoring  Patient Name: Cynthia Frank is a 85 y.o. female Date: 12/22/2020 Primary Care Physican: [name], MD Primary Cardiologist: Aundra Dubin Electrophysiologist: Santina Evans Pacing:  74.9%       09/22/2020 Office Weight: 115 lbs    Since 16-Dec-2020 AT/AF 308 Time in AT/AF 12.1 hr/day (50.4%) Longest AT/AF 69 minutes     Battery ERI 5 months           Transmission reviewed.   Total Hip surgery was completed on 12/02/2020 after a recent fall.  Per Dr Claris Gladden 11/09/2020 note patient scheduled for cardioversion 8/3 for flutter.     Opitvol thoracic impedance trending back close to baseline.  Started on amiodarone by Dr Aundra Dubin 12/16/2020   Prescribed: Furosemide 40 mg Take 1 tablet (40 mg total) by mouth daily Potassium 20 mEq take 1 tablet daily   Labs: 12/04/2020 Creatinine 0.70, BUN 24, Potassium 4.5, Sodium 129, GFR >60 12/03/2020 Creatinine 0.89, BUN 20, Potassium 4.2, Sodium 131, GFR >60  12/02/2020 Creatinine 0.93, BUN 21, Potassium 4.5, Sodium 132, GFR >60  11/09/2020 Creatinine 1.02, BUN 15, Potassium 3.3, Sodium 134, GFR 55  09/08/2020 Creatinine 0.98, BUN 11, Potassium 3.5, Sodium 131, GFR 58 07/27/2020 Creatinine 1.19, BUN 18, Potassium 5.1, Sodium 127, GFR 46 07/24/2020 Creatinine 0.93, BUN 16, Potassium 3.6, Sodium 129, GFR >60 07/22/2020 Creatinine 1.30, BUN 18, Potassium 3.4, Sodium 131, GFR 41 07/21/2020 Creatinine 1.24, BUN 20, Potassium 3.8, Sodium 127, GFR 43 A complete set of results can be found in Results Review.   Recommendations:  No changes.   Follow-up plan: ICM clinic phone appointment on 01/25/2021.   91 day device clinic remote transmission 01/26/2021.     EP/Cardiology Office Visits:  None scheduled.  Last EP visit was 11/03/2020 with Dr Curt Bears.   Copy of ICM check sent to Dr. Lovena Le.   3 month ICM trend: 12/22/2020.    1 Year ICM trend:       Rosalene Billings, RN 12/22/2020 4:22 PM

## 2020-12-24 DIAGNOSIS — S72092D Other fracture of head and neck of left femur, subsequent encounter for closed fracture with routine healing: Secondary | ICD-10-CM | POA: Diagnosis not present

## 2020-12-24 DIAGNOSIS — Z96642 Presence of left artificial hip joint: Secondary | ICD-10-CM | POA: Diagnosis not present

## 2020-12-24 DIAGNOSIS — I428 Other cardiomyopathies: Secondary | ICD-10-CM | POA: Diagnosis not present

## 2020-12-24 DIAGNOSIS — R262 Difficulty in walking, not elsewhere classified: Secondary | ICD-10-CM | POA: Diagnosis not present

## 2020-12-29 ENCOUNTER — Other Ambulatory Visit: Payer: Self-pay | Admitting: *Deleted

## 2020-12-29 ENCOUNTER — Telehealth: Payer: Medicare Other

## 2020-12-29 NOTE — Patient Outreach (Signed)
[place] Coordinator follow up. Member screened for potential [facility] care coordination needs. Verified in [place] (Patient [place]) that Mrs. Quentin Ore transitioned home on Saturday, 12/26/20.  Telephone call made to Mrs. Quentin Ore [phone]. Patient identifiers confirmed. Mrs. Haughn reports she has been home since Saturday. States she wants to be sure [facility] received the referral. Writer to follow up with [place] and/or [place] to confirm.   Discussed PCP office has [facility] embedded care coordination team. Mrs. Quentin Ore states the Pharmacist with PCP office is calling her on Thursday. States "that is all I need." Goes on to say " I see enough doctors. I do  not need any more calls." Declines writer making referral to [facility] RN with embedded care coordination team.  Mrs. Hearst states she has assistance at home. States she lives with her husband and has family support. Endorses she cancelled PCP appointment because she is not full weight bearing and it is difficult to maneuver. Encouraged Mrs. Quentin Ore to reschedule PCP appointment.  Writer contacted [facility]. Confirmed [facility] has a scheduled visit on this Thursday with Mrs. Quentin Ore. Provided [place] correct telephone number for Mrs. Quentin Ore.   Left voicemail for Mrs. Dietert [phone] to provide [facility] telephone number for further home health questions and to make aware [facility] is the home health agency.    No further needs assessed.    Marthenia Rolling, MSN, RN,BSN [place] [facility] Coordinator [phone] [facility]) [phone]  (Toll free office)

## 2020-12-30 ENCOUNTER — Telehealth: Payer: Medicare Other

## 2020-12-31 ENCOUNTER — Ambulatory Visit: Payer: Medicare Other | Admitting: Internal Medicine

## 2020-12-31 DIAGNOSIS — G4700 Insomnia, unspecified: Secondary | ICD-10-CM | POA: Diagnosis not present

## 2020-12-31 DIAGNOSIS — I110 Hypertensive heart disease with heart failure: Secondary | ICD-10-CM | POA: Diagnosis not present

## 2020-12-31 DIAGNOSIS — I428 Other cardiomyopathies: Secondary | ICD-10-CM | POA: Diagnosis not present

## 2020-12-31 DIAGNOSIS — I5022 Chronic systolic (congestive) heart failure: Secondary | ICD-10-CM | POA: Diagnosis not present

## 2020-12-31 DIAGNOSIS — Z95810 Presence of automatic (implantable) cardiac defibrillator: Secondary | ICD-10-CM | POA: Diagnosis not present

## 2020-12-31 DIAGNOSIS — I4891 Unspecified atrial fibrillation: Secondary | ICD-10-CM | POA: Diagnosis not present

## 2020-12-31 DIAGNOSIS — E039 Hypothyroidism, unspecified: Secondary | ICD-10-CM | POA: Diagnosis not present

## 2020-12-31 DIAGNOSIS — S72002D Fracture of unspecified part of neck of left femur, subsequent encounter for closed fracture with routine healing: Secondary | ICD-10-CM | POA: Diagnosis not present

## 2020-12-31 DIAGNOSIS — Z4732 Aftercare following explantation of hip joint prosthesis: Secondary | ICD-10-CM | POA: Diagnosis not present

## 2020-12-31 DIAGNOSIS — Z96642 Presence of left artificial hip joint: Secondary | ICD-10-CM | POA: Diagnosis not present

## 2020-12-31 DIAGNOSIS — S72092D Other fracture of head and neck of left femur, subsequent encounter for closed fracture with routine healing: Secondary | ICD-10-CM | POA: Diagnosis not present

## 2020-12-31 DIAGNOSIS — Z9181 History of falling: Secondary | ICD-10-CM | POA: Diagnosis not present

## 2020-12-31 DIAGNOSIS — K219 Gastro-esophageal reflux disease without esophagitis: Secondary | ICD-10-CM | POA: Diagnosis not present

## 2020-12-31 DIAGNOSIS — M170 Bilateral primary osteoarthritis of knee: Secondary | ICD-10-CM | POA: Diagnosis not present

## 2020-12-31 DIAGNOSIS — R55 Syncope and collapse: Secondary | ICD-10-CM | POA: Diagnosis not present

## 2020-12-31 DIAGNOSIS — Z7901 Long term (current) use of anticoagulants: Secondary | ICD-10-CM | POA: Diagnosis not present

## 2020-12-31 DIAGNOSIS — Z7984 Long term (current) use of oral hypoglycemic drugs: Secondary | ICD-10-CM | POA: Diagnosis not present

## 2020-12-31 DIAGNOSIS — E785 Hyperlipidemia, unspecified: Secondary | ICD-10-CM | POA: Diagnosis not present

## 2021-01-04 ENCOUNTER — Telehealth: Payer: Self-pay | Admitting: Gastroenterology

## 2021-01-04 NOTE — Telephone Encounter (Signed)
Patient with a 3 week hx of nausea and "dry heaves".  She has tried Zofran prescribed by her PCP with little success.  She will come in and see Dr. Fuller Plan on 01/08/21 9:50.  She encouraged to continue Zofran and try small bland meals until OV

## 2021-01-05 DIAGNOSIS — Z96642 Presence of left artificial hip joint: Secondary | ICD-10-CM | POA: Diagnosis not present

## 2021-01-05 DIAGNOSIS — S72092D Other fracture of head and neck of left femur, subsequent encounter for closed fracture with routine healing: Secondary | ICD-10-CM | POA: Diagnosis not present

## 2021-01-05 DIAGNOSIS — I5022 Chronic systolic (congestive) heart failure: Secondary | ICD-10-CM | POA: Diagnosis not present

## 2021-01-05 DIAGNOSIS — M170 Bilateral primary osteoarthritis of knee: Secondary | ICD-10-CM | POA: Diagnosis not present

## 2021-01-05 DIAGNOSIS — I4891 Unspecified atrial fibrillation: Secondary | ICD-10-CM | POA: Diagnosis not present

## 2021-01-05 DIAGNOSIS — I110 Hypertensive heart disease with heart failure: Secondary | ICD-10-CM | POA: Diagnosis not present

## 2021-01-05 NOTE — Progress Notes (Addendum)
Date:  12/17/2020   ID:  Cynthia Frank, DOB [birth year], MRN [id number]  Provider location: [place] [facility] Type of Visit: Established patient   PCP:  [name], MD  Cardiologist:  Dr. Aundra Dubin   History of Present Illness: Cynthia Frank is a 85 y.o. female who has a history of chronic systolic CHF/nonischemic cardiomyopathy, paroxysmal atrial fibrillation, and prior ovarian and colon cancers. She has had a cardiomyopathy known for > 15 years now. Cardiac cath at diagnosis showed mild nonobstructive disease.  EF has been persistently low.  Cause has been thought to be Adriamycin; she received this as part of her ovarian cancer treatment. She has had paroxysmal atrial fibrillation and has remained in NSR with amiodarone use.  No recent atrial fibrillation symptoms, typically feels prolonged palpitations thought to be atrial fibrillation 2-3 times/year.    She had had dyspnea with moderate to heavy exertion for 4-5 years prior to initial appt.  However, just before her initial appt, her symptoms had worsened.  She developed shortness of breath walking short distances on flat ground.  Lasix was increased to 40 mg bid and she lost weight and felt better.      Cardiac MRI was done in 1/17, showing EF 29% with normal RV.  Septal-lateral dyssynchrony was present and there was a non-coronary pattern of LGE in the septum.  LFTs were noted to be mildly elevated.  Amiodarone was decreased to 100 mg daily.    She had Medtronic CRT-D placed in 3/17. Repeat echo 8/17 showed EF 35% with diffuse hypokinesis, normal RV.  Echo in 4/19 showed EF 30-35%, mild AI.    On 11/12/17, she stood up, got lightheaded and passed out briefly with a fall.  Her ICD did not discharge.  She went to the ER and was found to have right C5 transverse process fracture.  She says that she has had lightheadedness with standing for years, comes and goes.  I decreased her Toprol XL.   In 8/20, she  had an episode of lightheadedness with standing followed by syncope and fall with right hip fracture.  She was admitted, had repair of right hip.  She was noted to be orthostatic and Entresto was stopped.  Echo in 8/20 showed EF 45-50% with normal RV systolic function.  In 1/21, patient was in atrial tachycardia and had been in it for several weeks.  She was volume overloaded.  Lasix was increased and she was brought into the hospital for DCCV, but she had converted to NSR on her own.    In 4/21, she had atrial fibrillation ablation.  TEE done in 4/21 showed EF 30-35% with global HK.  She is now off amiodarone.   Echo in 11/21 showed EF 25-30% with mild LV dilation, global hypokinesis with septal-lateral dyssynchrony, moderate MR, moderately decreased RV systolic function with normal RV size, IVC normal.   She went into atrial flutter in 11/21 and had DCCV back to NSR in 1/22.  She stayed in NSR for a few weeks but went back in atrial flutter again.  She had COVID-19 in 2/22.  Redo AF ablation in 3/22.   TEE in 3/22 showed EF 20-25%, moderate RV dysfunction, moderate TR, severe MR.  In 4/22, she had Mitraclip placement.  Repeat echo in 5/22 showed EF 20-25%, moderate RV dysfunction, s/p Mitraclip with mean gradient 2 with trivial MR.    DCCV 11/18/20 with successful return to SR.  Admitted 12/02/20 for  total hip repair after sustaining hip fracture after mechanical fall. Was in NSR during this admission. Arlyce Harman stopped due to low BP. Discharged to SNF for PT/OT.  Clinic was notified by device RN that she was back in AFL. Low-dose amiodarone was started with plans to arrange for DCCV.  Today she returns for post hospital HF follow up with her husband. She was at [facility] for 10 days after her hip repair for rehab. She is still NWB on left leg. She has not been able to eat much due to nausea. She feels weak but not overly dyspneic with transfers. Denies CP, dizziness, edema, or PND/Orthopnea. Appetite poor  due to nausea. She has an appointment with GI soon to discuss this. No fever or chills. She does not weigh regularly at home. Taking all medications.   Medtronic device interrogation: Persistent atrial flutter, 50% BiV pacing. Appears she went out of rhythm ~12/07/20.  ECG (personally reviewed): Atrial flutter, BiV pacing.   Labs (1/17): K 4.2, creatinine 1.14, BNP 855 Labs (3/17): SPEP negative, BNP 341, K 4.5, creatinine 1.27, AST 62, ALT 74, TSH normal with low free T3 and normal free T4, HCT 46.5 Labs (4/17): AST 64, ALT 67, BNP 278, K 3.6, creatinine 1.21 Labs (5/17): K 3.4, creatinine 1.39, AST 53, ALT 61, HBV/HCV negative, TSH mildly elevated, BNP 306 Labs (6/17): K 4.4, creatinine 1.36, free T4 normal, free T3 mildly decreased, AST 50, ALT 53. Labs (7/17): ANA negative, ASMA positive Labs (8/17): AST 45, ALT 37, K 3.8, creatinine 1.19, TSH normal Labs (2/18): TSH normal, LDL 47, LFTs, normal Labs (3/18): K normal, creatinine 1.23.  Labs (7/18): K 4, creatinine 1.32, LFTs normal Labs (7/19): K 3.8, creatinine 1.25, hgb 11.6 Labs (8/19): TSH mildly elevated, K 4.2, creatinine 1.22 Labs (10/19): K 4.1, creatinine 1.23, LFTs normal, TSH mildly elevated with normal free T3 and free T4.  Labs (1/20): K 3.5, creatinine 1.15, TSH elevated, free T3 and free T4 low, AST 63, ALT 51 Labs (5/20): K 3.8, creatinine 1.22, TSH normal, AST 49, ALT 43, tbili 0.9 Labs (11/20): K 4, creatinine 1.15  Labs (12/20): K 4.1, creatinine 1.25, TSH normal, AST 51, ALT 50 Labs (1/21): AST 145, ALT 107, tbili 1.8, TSH normal Labs (2/21): K 3.8, creatinine 1.38 Labs (3/21): AST 50, ALT 46 Labs (4/21): K 3.9, creatinine 0.96 Labs (5/21): K 4.5, creatinine 1.11, hgb 13.1, plts 141, AST 65, ALT 53 Labs (8/21): LDL 52, K 3.8, creatinine 1.11, AST 67, ALT 52 Labs (11/21): LFTs normal Labs (12/21): K 3.5, creatinine 1.33, LFTs normal Labs (3/22): K 4.6, creatinine 1.09, hgb 12.2 Labs (4/22): K 5.1, creatinine  1.19 Labs (5/22): K 3.5, creatinine 0.98, BNP 106.7, hgb 13.5, AST 42, ALT 29 Labs (8/22): K 4.5, creatinine 0.70   PMH: 1. Chronic systolic CHF: Nonischemic cardiomyopathy, thought to be related to adriamycin that she had for treatment of ovarian cancer.  NICM diagnosed ~ 2001.  - LHC ~ 2001 with 30% stenosis D1.   - Echo (2013) EF 25-30%.  - Echo (10/14) with EF 25-30%. - Echo (12/16) with EF 20-25%, mild LV dilation, restrictive diastolic function, moderate MR, severe LAE. - Cardiac MRI (1/17): Moderately dilated LV with EF 29%. Diffuse hypokinesis looking worse in the septal wall. Septal-lateral dyssynchrony. Normal RV size and systolic function.  Moderate MR with tethered posterior leaflet.  Mid-wall LGE in the basal to mid septum. This is not a coronary disease pattern. Could be suggestive of prior myocarditis. - Medtronic  CRT-D placed 3/17.  - Echo (8/17): EF 35%, diffuse hypokinesis, normal RV size and systolic function, moderate MR.   - Echo (4/19): EF 30-35%, mild AI.  - Echo (8/20): EF 45-50%, diffuse hypokinesis, normal RV size and systolic function. - TEE (4/21): EF 30%, global hypokinesis.  - Echo (11/21): EF 25-30% with mild LV dilation, global hypokinesis with septal-lateral dyssynchrony, moderate MR, moderately decreased RV systolic function with normal RV size, IVC normal.  - Echo (5/22): EF 20-25%, moderate RV dysfunction, s/p Mitraclip with mean gradient 2 with trivial MR. 2. Atrial fibrillation/atrial tachycardia: Paroxysmal.  - 4/21 atrial fibrillation ablation. - 3/22 redo AF ablation  3. HTN 4. Ovarian cancer: 1985, s/p surgery. Received Adriamycin-based chemotherapy.  5. Colon cancer: 2007, s/p surgery.  6. PVCs 7. GERD 8. PFTs (8/16) without significant abnormality. 9. Prior syncope 10. Elevated transaminases: Uncertain etiology.  - Abdominal US (12/20): Normal-appearing liver.  11. COVID-19 infection: 2/22.  12. Atrial flutter: DCCV in 1/22.  13. Mitral  regurgitation: Primarily functional.  S/p Mitraclip in 4/22.   Current Outpatient Medications  Medication Sig Dispense Refill   amiodarone (PACERONE) 200 MG tablet Take 1 tablet (200 mg total) by mouth daily. 90 tablet 3   apixaban (ELIQUIS) 2.5 MG TABS tablet TAKE 1 TABLET(2.5 MG) BY MOUTH TWICE DAILY (Patient taking differently: Take 2.5 mg by mouth 2 (two) times daily.) 180 tablet 1   atorvastatin (LIPITOR) 40 MG tablet TAKE 1 TABLET(40 MG) BY MOUTH DAILY AT 6 PM 90 tablet 3   citalopram (CELEXA) 20 MG tablet Take 20 mg by mouth daily.     Coenzyme Q10 (COQ10) 200 MG CAPS Take 200 mg by mouth daily.     docusate sodium (COLACE) 100 MG capsule Take 100 mg by mouth daily.     FARXIGA 10 MG TABS tablet TAKE 1 TABLET(10 MG) BY MOUTH DAILY BEFORE BREAKFAST (Patient taking differently: Take 10 mg by mouth daily.) 30 tablet 6   furosemide (LASIX) 40 MG tablet Take 1 tablet (40 mg total) by mouth daily. 30 tablet 5   HYDROcodone-acetaminophen (NORCO/VICODIN) 5-325 MG tablet Take 1-2 tablets by mouth every 6 (six) hours as needed for severe pain. 42 tablet 0   ipratropium (ATROVENT) 0.03 % nasal spray Place 1 spray into both nostrils 2 (two) times daily.     levothyroxine (SYNTHROID) 25 MCG tablet TAKE 1 TABLET(25 MCG) BY MOUTH DAILY BEFORE BREAKFAST 30 tablet 6   Magnesium Oxide 500 MG CAPS Take 1,000 mg by mouth 2 (two) times daily.      methocarbamol (ROBAXIN) 500 MG tablet Take 1 tablet (500 mg total) by mouth every 6 (six) hours as needed for muscle spasms. 30 tablet 0   ondansetron (ZOFRAN) 4 MG tablet Take 1 tablet (4 mg total) by mouth every 6 (six) hours as needed for nausea or vomiting. 20 tablet 0   polyethylene glycol (MIRALAX / GLYCOLAX) 17 g packet Take 17 g by mouth daily as needed for mild constipation. 14 each 0   potassium chloride SA (KLOR-CON) 20 MEQ tablet Take 1 tablet (20 mEq total) by mouth daily. 90 tablet 0   zolpidem (AMBIEN) 5 MG tablet Take 1 tablet (5 mg total) by mouth at  bedtime as needed for sleep. 30 tablet 3   amoxicillin (AMOXIL) 500 MG tablet Take 4 tablets (2,000 mg total) by mouth once as needed for up to 1 dose (take 1 hour before dental procedure). (Patient not taking: Reported on 12/29/2020) 4 tablet 3  No current facility-administered medications for this encounter.    Allergies:   Clindamycin/lincomycin, Dramamine ii [meclizine hcl], Pneumococcal vaccines, and Sulfonamide derivatives   Social History:  The patient  reports that she quit smoking about 42 years ago. Her smoking use included cigarettes. She has a 10.00 pack-year smoking history. She has never used smokeless tobacco. She reports current alcohol use of about 14.0 standard drinks per week. She reports that she does not use drugs.   Family History:  The patient's family history includes Heart attack in her father; Heart disease in her father; Prostate cancer in her father; Uterine cancer in her mother.   ROS:  Please see the history of present illness.   All other systems are personally reviewed and negative.   Exam:  BP (!) 89/74   Pulse (!) 110   SpO2 93%   General:  NAD. No resp difficulty, arrived in [facility], frail HEENT: HOH, bilateral hearing aids in place. Neck: Supple. JVP 6-7. Carotids 2+ bilat; no bruits. No lymphadenopathy or thryomegaly appreciated. Cor: PMI nondisplaced. Irregular rate & rhythm. No rubs, gallops or murmurs. Lungs: Clear Abdomen: Soft, nontender, nondistended. No hepatosplenomegaly. No bruits or masses. Good bowel sounds. Extremities: No cyanosis, clubbing, rash, 1+ BLE edema, pedal edema L>R Neuro: Alert & oriented x 3, cranial nerves grossly intact. Moves all 4 extremities w/o difficulty. Affect pleasant.  Recent Labs: 07/18/2020: Magnesium 2.2 09/08/2020: B Natriuretic Peptide 1,067.3 11/09/2020: TSH 6.870 12/02/2020: ALT 41 12/04/2020: BUN 24; Creatinine, Ser 0.70; Hemoglobin 8.4; Platelets 103; Potassium 4.5; Sodium 129  Personally reviewed   Wt  Readings from Last 3 Encounters:  12/02/20 56 kg (123 lb 7.3 oz)  11/24/20 55 kg (121 lb 3.2 oz)  11/18/20 52.2 kg (115 lb)   ASSESSMENT AND PLAN:  1. Chronic systolic CHF: EF 79-89% with diffuse hypokinesis on 12/16 echo.  Nonischemic cardiomyopathy, probably related to Adriamycin with ovarian cancer treatment. Cardiac MRI in 1/17 showed EF 29% with normal RV and septal-lateral dyssynchrony.  There was a non-cardiac pattern of LGE in the septum, cannot rule out prior myocarditis based on this pattern.  She has a Medtronic CRT-D device. Echo 4/19 with EF 30-35%, then echo in 8/20 showed EF up to 45-50%.  TEE in 3/21 showed EF 30-35%.  Echo in 11/21 showed EF 25-30% with mild LV dilation, global hypokinesis with septal-lateral dyssynchrony, moderate MR, moderately decreased RV systolic function with normal RV size, IVC normal.  In 4/22, she had Mitraclip placement. In 5/22, echo showed EF 20-25%, moderate RV dysfunction, s/p Mitraclip with mean gradient 2 with trivial MR. She is back in atrial flutter today with decreased BiV pacing and mildly worsened symptoms, NYHA class IIIb, although functional status confounded by physical deconditioning after her hip fracture.  She is mildly volume overloaded on exam and OptiVol is up. - Increase her lasix to 40 mg bid + extra 20 KCl today only, then back to lasix 40 mg daily tomorrow. - She is off her spiro as of recent hospitalization. No BP room to add today.  - She is off Toprol XL, she feels like it makes her excessively fatigued.  - She will stay off Entresto given syncope and lightheadedness while taking it.  Will not start losartan with soft BP.  - Continue Farxiga 10 mg daily.  - She will need to get back into NSR, see below.  2. Atrial arrhythmias: She needs to maintain NSR as she loses BiV pacing when she goes into atrial arrhythmias, triggering CHF  exacerbation.  She had atrial fibrillation ablation in 4/21.  She went into atrial flutter in 11/21, she  had DCCV in 12/21.  She was off amiodarone with elevated LFTs.  She had redo AF ablation in 3/22.  She underwent DCCV 8/22, unfortunately she is back in atrial flutter. - Continue amiodarone 200 mg daily. Discussed with Dr. Aundra Dubin, will not increase further with history of elevated FLTs. - Continue apixaban, no missed doses. No abnormal bleeding. - Will arrange for DCCV with Dr. Aundra Dubin. We discussed risks/benefits of procedure and she is agreeable. 3. Hypothyroidism: Suspect related to prior amiodarone use.   - Continue Levoxyl.  4. Elevated LFTs: Mild elevation in past, resolved in 11/21.  Abdominal US showed unremarkable liver.  ?Related to amiodarone.  She is now back off amiodarone.  She has seen GI. Most recent LFTs were near normal. Unclear if her nausea is due to resumption of amiodarone. - Check LFTs today as she has been back on low-dose amiodarone for three weeks.   5. CAD: Calcification noted in coronaries with pulmonary vein CT done prior to afib ablation.  - Continue atorvastatin 40 daily, good lipids in 8/21.  - No ASA given apixaban use.  6. Deconditioning: Continue HH PT as able.   Will need to get her back in rhythm soon. Follow up after DCCV with APP.  Signed, Rafael Bihari, FNP  12/18/2020  [facility] [address] [facility] [place] [address] [phone] (office) [phone] (fax)

## 2021-01-06 ENCOUNTER — Emergency Department (HOSPITAL_COMMUNITY): Payer: Medicare Other

## 2021-01-06 ENCOUNTER — Encounter (HOSPITAL_COMMUNITY): Payer: Self-pay | Admitting: Emergency Medicine

## 2021-01-06 ENCOUNTER — Ambulatory Visit (HOSPITAL_BASED_OUTPATIENT_CLINIC_OR_DEPARTMENT_OTHER)
Admission: RE | Admit: 2021-01-06 | Discharge: 2021-01-06 | Disposition: A | Discharge status: Home / Self Care | Payer: Medicare Other | Source: Ambulatory Visit | Attending: Cardiology | Admitting: Cardiology

## 2021-01-06 ENCOUNTER — Other Ambulatory Visit: Payer: Self-pay

## 2021-01-06 ENCOUNTER — Inpatient Hospital Stay (HOSPITAL_COMMUNITY)
Admission: EM | Admit: 2021-01-06 | Discharge: 2021-01-16 | DRG: 291 | Disposition: E | Payer: Medicare Other | Attending: Internal Medicine | Admitting: Internal Medicine

## 2021-01-06 ENCOUNTER — Encounter (HOSPITAL_COMMUNITY): Payer: Self-pay

## 2021-01-06 ENCOUNTER — Other Ambulatory Visit (HOSPITAL_COMMUNITY): Payer: Self-pay | Admitting: *Deleted

## 2021-01-06 VITALS — BP 89/74 | HR 110

## 2021-01-06 DIAGNOSIS — Z8616 Personal history of COVID-19: Secondary | ICD-10-CM | POA: Insufficient documentation

## 2021-01-06 DIAGNOSIS — Z8249 Family history of ischemic heart disease and other diseases of the circulatory system: Secondary | ICD-10-CM | POA: Insufficient documentation

## 2021-01-06 DIAGNOSIS — R110 Nausea: Secondary | ICD-10-CM

## 2021-01-06 DIAGNOSIS — G9340 Encephalopathy, unspecified: Secondary | ICD-10-CM | POA: Diagnosis present

## 2021-01-06 DIAGNOSIS — R531 Weakness: Secondary | ICD-10-CM | POA: Insufficient documentation

## 2021-01-06 DIAGNOSIS — I4819 Other persistent atrial fibrillation: Secondary | ICD-10-CM | POA: Diagnosis not present

## 2021-01-06 DIAGNOSIS — J9601 Acute respiratory failure with hypoxia: Secondary | ICD-10-CM | POA: Diagnosis not present

## 2021-01-06 DIAGNOSIS — E039 Hypothyroidism, unspecified: Secondary | ICD-10-CM | POA: Insufficient documentation

## 2021-01-06 DIAGNOSIS — Z95810 Presence of automatic (implantable) cardiac defibrillator: Secondary | ICD-10-CM

## 2021-01-06 DIAGNOSIS — R5381 Other malaise: Secondary | ICD-10-CM | POA: Diagnosis not present

## 2021-01-06 DIAGNOSIS — Z09 Encounter for follow-up examination after completed treatment for conditions other than malignant neoplasm: Secondary | ICD-10-CM | POA: Insufficient documentation

## 2021-01-06 DIAGNOSIS — Z9221 Personal history of antineoplastic chemotherapy: Secondary | ICD-10-CM

## 2021-01-06 DIAGNOSIS — E861 Hypovolemia: Secondary | ICD-10-CM | POA: Diagnosis present

## 2021-01-06 DIAGNOSIS — Z95818 Presence of other cardiac implants and grafts: Secondary | ICD-10-CM

## 2021-01-06 DIAGNOSIS — Z9049 Acquired absence of other specified parts of digestive tract: Secondary | ICD-10-CM

## 2021-01-06 DIAGNOSIS — K760 Fatty (change of) liver, not elsewhere classified: Secondary | ICD-10-CM | POA: Diagnosis present

## 2021-01-06 DIAGNOSIS — I5022 Chronic systolic (congestive) heart failure: Secondary | ICD-10-CM | POA: Insufficient documentation

## 2021-01-06 DIAGNOSIS — I484 Atypical atrial flutter: Secondary | ICD-10-CM

## 2021-01-06 DIAGNOSIS — I959 Hypotension, unspecified: Secondary | ICD-10-CM

## 2021-01-06 DIAGNOSIS — Z85038 Personal history of other malignant neoplasm of large intestine: Secondary | ICD-10-CM

## 2021-01-06 DIAGNOSIS — E872 Acidosis, unspecified: Secondary | ICD-10-CM

## 2021-01-06 DIAGNOSIS — Z7984 Long term (current) use of oral hypoglycemic drugs: Secondary | ICD-10-CM

## 2021-01-06 DIAGNOSIS — R1033 Periumbilical pain: Secondary | ICD-10-CM

## 2021-01-06 DIAGNOSIS — I480 Paroxysmal atrial fibrillation: Secondary | ICD-10-CM | POA: Insufficient documentation

## 2021-01-06 DIAGNOSIS — R570 Cardiogenic shock: Secondary | ICD-10-CM | POA: Diagnosis not present

## 2021-01-06 DIAGNOSIS — E119 Type 2 diabetes mellitus without complications: Secondary | ICD-10-CM

## 2021-01-06 DIAGNOSIS — R7989 Other specified abnormal findings of blood chemistry: Secondary | ICD-10-CM

## 2021-01-06 DIAGNOSIS — I428 Other cardiomyopathies: Secondary | ICD-10-CM | POA: Insufficient documentation

## 2021-01-06 DIAGNOSIS — J90 Pleural effusion, not elsewhere classified: Secondary | ICD-10-CM | POA: Diagnosis not present

## 2021-01-06 DIAGNOSIS — I2510 Atherosclerotic heart disease of native coronary artery without angina pectoris: Secondary | ICD-10-CM | POA: Insufficient documentation

## 2021-01-06 DIAGNOSIS — K219 Gastro-esophageal reflux disease without esophagitis: Secondary | ICD-10-CM | POA: Diagnosis present

## 2021-01-06 DIAGNOSIS — E032 Hypothyroidism due to medicaments and other exogenous substances: Secondary | ICD-10-CM | POA: Diagnosis not present

## 2021-01-06 DIAGNOSIS — I5023 Acute on chronic systolic (congestive) heart failure: Secondary | ICD-10-CM | POA: Diagnosis not present

## 2021-01-06 DIAGNOSIS — E871 Hypo-osmolality and hyponatremia: Secondary | ICD-10-CM | POA: Diagnosis present

## 2021-01-06 DIAGNOSIS — Z515 Encounter for palliative care: Secondary | ICD-10-CM

## 2021-01-06 DIAGNOSIS — Z87891 Personal history of nicotine dependence: Secondary | ICD-10-CM | POA: Insufficient documentation

## 2021-01-06 DIAGNOSIS — I4892 Unspecified atrial flutter: Secondary | ICD-10-CM | POA: Insufficient documentation

## 2021-01-06 DIAGNOSIS — I5020 Unspecified systolic (congestive) heart failure: Secondary | ICD-10-CM | POA: Diagnosis present

## 2021-01-06 DIAGNOSIS — Z9889 Other specified postprocedural states: Secondary | ICD-10-CM | POA: Diagnosis not present

## 2021-01-06 DIAGNOSIS — Z79890 Hormone replacement therapy (postmenopausal): Secondary | ICD-10-CM

## 2021-01-06 DIAGNOSIS — R778 Other specified abnormalities of plasma proteins: Secondary | ICD-10-CM | POA: Diagnosis not present

## 2021-01-06 DIAGNOSIS — N179 Acute kidney failure, unspecified: Secondary | ICD-10-CM | POA: Diagnosis present

## 2021-01-06 DIAGNOSIS — Z7901 Long term (current) use of anticoagulants: Secondary | ICD-10-CM

## 2021-01-06 DIAGNOSIS — M810 Age-related osteoporosis without current pathological fracture: Secondary | ICD-10-CM | POA: Diagnosis present

## 2021-01-06 DIAGNOSIS — Z8049 Family history of malignant neoplasm of other genital organs: Secondary | ICD-10-CM

## 2021-01-06 DIAGNOSIS — Z66 Do not resuscitate: Secondary | ICD-10-CM | POA: Diagnosis not present

## 2021-01-06 DIAGNOSIS — Z8543 Personal history of malignant neoplasm of ovary: Secondary | ICD-10-CM

## 2021-01-06 DIAGNOSIS — Z95828 Presence of other vascular implants and grafts: Secondary | ICD-10-CM

## 2021-01-06 DIAGNOSIS — R079 Chest pain, unspecified: Secondary | ICD-10-CM | POA: Diagnosis not present

## 2021-01-06 DIAGNOSIS — R109 Unspecified abdominal pain: Secondary | ICD-10-CM | POA: Diagnosis not present

## 2021-01-06 DIAGNOSIS — I110 Hypertensive heart disease with heart failure: Secondary | ICD-10-CM | POA: Insufficient documentation

## 2021-01-06 DIAGNOSIS — I5084 End stage heart failure: Secondary | ICD-10-CM | POA: Diagnosis present

## 2021-01-06 DIAGNOSIS — R579 Shock, unspecified: Secondary | ICD-10-CM | POA: Diagnosis present

## 2021-01-06 DIAGNOSIS — I4891 Unspecified atrial fibrillation: Secondary | ICD-10-CM | POA: Diagnosis present

## 2021-01-06 DIAGNOSIS — N390 Urinary tract infection, site not specified: Secondary | ICD-10-CM

## 2021-01-06 DIAGNOSIS — R54 Age-related physical debility: Secondary | ICD-10-CM | POA: Diagnosis present

## 2021-01-06 DIAGNOSIS — Z79899 Other long term (current) drug therapy: Secondary | ICD-10-CM | POA: Insufficient documentation

## 2021-01-06 DIAGNOSIS — Z90710 Acquired absence of both cervix and uterus: Secondary | ICD-10-CM

## 2021-01-06 DIAGNOSIS — E875 Hyperkalemia: Secondary | ICD-10-CM | POA: Diagnosis present

## 2021-01-06 DIAGNOSIS — E785 Hyperlipidemia, unspecified: Secondary | ICD-10-CM | POA: Diagnosis present

## 2021-01-06 LAB — COMPREHENSIVE METABOLIC PANEL
ALT: 150 U/L — ABNORMAL HIGH (ref 0–44)
ALT: 140 U/L — ABNORMAL HIGH (ref 0–44)
AST: 197 U/L — ABNORMAL HIGH (ref 15–41)
AST: 194 U/L — ABNORMAL HIGH (ref 15–41)
Albumin: 3.3 g/dL — ABNORMAL LOW (ref 3.5–5.0)
Albumin: 3 g/dL — ABNORMAL LOW (ref 3.5–5.0)
Alkaline Phosphatase: 101 U/L (ref 38–126)
Alkaline Phosphatase: 92 U/L (ref 38–126)
Anion gap: 12 (ref 5–15)
Anion gap: 20 — ABNORMAL HIGH (ref 5–15)
BUN: 21 mg/dL (ref 8–23)
BUN: 25 mg/dL — ABNORMAL HIGH (ref 8–23)
CO2: 24 mmol/L (ref 22–32)
CO2: 14 mmol/L — ABNORMAL LOW (ref 22–32)
Calcium: 8.3 mg/dL — ABNORMAL LOW (ref 8.9–10.3)
Calcium: 8.1 mg/dL — ABNORMAL LOW (ref 8.9–10.3)
Chloride: 90 mmol/L — ABNORMAL LOW (ref 98–111)
Chloride: 94 mmol/L — ABNORMAL LOW (ref 98–111)
Creatinine, Ser: 1.38 mg/dL — ABNORMAL HIGH (ref 0.44–1.00)
Creatinine, Ser: 1.75 mg/dL — ABNORMAL HIGH (ref 0.44–1.00)
GFR, Estimated: 38 mL/min — ABNORMAL LOW (ref >60)
GFR, Estimated: 29 mL/min — ABNORMAL LOW (ref >60)
Glucose, Bld: 144 mg/dL — ABNORMAL HIGH (ref 70–99)
Glucose, Bld: 134 mg/dL — ABNORMAL HIGH (ref 70–99)
Potassium: 2.9 mmol/L — ABNORMAL LOW (ref 3.5–5.1)
Potassium: 4.9 mmol/L (ref 3.5–5.1)
Sodium: 126 mmol/L — ABNORMAL LOW (ref 135–145)
Sodium: 128 mmol/L — ABNORMAL LOW (ref 135–145)
Total Bilirubin: 2.2 mg/dL — ABNORMAL HIGH (ref 0.3–1.2)
Total Bilirubin: 2.2 mg/dL — ABNORMAL HIGH (ref 0.3–1.2)
Total Protein: 6.4 g/dL — ABNORMAL LOW (ref 6.5–8.1)
Total Protein: 6.1 g/dL — ABNORMAL LOW (ref 6.5–8.1)

## 2021-01-06 LAB — CBC WITH DIFFERENTIAL/PLATELET
Abs Immature Granulocytes: 0.05 10*3/uL (ref 0.00–0.07)
Basophils Absolute: 0 10*3/uL (ref 0.0–0.1)
Basophils Relative: 0 %
Eosinophils Absolute: 0 10*3/uL (ref 0.0–0.5)
Eosinophils Relative: 0 %
HCT: 36.7 % (ref 36.0–46.0)
Hemoglobin: 11.5 g/dL — ABNORMAL LOW (ref 12.0–15.0)
Immature Granulocytes: 1 %
Lymphocytes Relative: 12 %
Lymphs Abs: 0.9 10*3/uL (ref 0.7–4.0)
MCH: 30 pg (ref 26.0–34.0)
MCHC: 31.3 g/dL (ref 30.0–36.0)
MCV: 95.8 fL (ref 80.0–100.0)
Monocytes Absolute: 0.7 10*3/uL (ref 0.1–1.0)
Monocytes Relative: 9 %
Neutro Abs: 5.9 10*3/uL (ref 1.7–7.7)
Neutrophils Relative %: 78 %
Platelets: 157 10*3/uL (ref 150–400)
RBC: 3.83 MIL/uL — ABNORMAL LOW (ref 3.87–5.11)
RDW: 16.1 % — ABNORMAL HIGH (ref 11.5–15.5)
WBC: 7.6 10*3/uL (ref 4.0–10.5)
nRBC: 0.3 % — ABNORMAL HIGH (ref 0.0–0.2)

## 2021-01-06 LAB — CBC
HCT: 35.8 % — ABNORMAL LOW (ref 36.0–46.0)
Hemoglobin: 11.5 g/dL — ABNORMAL LOW (ref 12.0–15.0)
MCH: 29.6 pg (ref 26.0–34.0)
MCHC: 32.1 g/dL (ref 30.0–36.0)
MCV: 92.3 fL (ref 80.0–100.0)
Platelets: 155 10*3/uL (ref 150–400)
RBC: 3.88 MIL/uL (ref 3.87–5.11)
RDW: 15.9 % — ABNORMAL HIGH (ref 11.5–15.5)
WBC: 6.5 10*3/uL (ref 4.0–10.5)
nRBC: 0 % (ref 0.0–0.2)

## 2021-01-06 LAB — RESP PANEL BY RT-PCR (FLU A&B, COVID) ARPGX2
Influenza A by PCR: NEGATIVE
Influenza B by PCR: NEGATIVE
SARS Coronavirus 2 by RT PCR: NEGATIVE

## 2021-01-06 LAB — LACTIC ACID, PLASMA: Lactic Acid, Venous: 9.3 mmol/L (ref 0.5–1.9)

## 2021-01-06 MED ORDER — SODIUM CHLORIDE 0.9 % IV BOLUS (SEPSIS)
250.0000 mL | Freq: Once | INTRAVENOUS | Status: AC
  Administered 2021-01-06: 250 mL via INTRAVENOUS

## 2021-01-06 MED ORDER — CEFEPIME HCL 2 G IJ SOLR
2.0000 g | INTRAMUSCULAR | Status: DC
  Administered 2021-01-06: 2 g via INTRAVENOUS

## 2021-01-06 MED ORDER — SODIUM CHLORIDE 0.9 % IV BOLUS
500.0000 mL | Freq: Once | INTRAVENOUS | Status: AC
Start: 2021-01-06 — End: 2021-01-06
  Administered 2021-01-06: 500 mL via INTRAVENOUS

## 2021-01-06 MED ORDER — SODIUM CHLORIDE 0.9 % IV BOLUS
500.0000 mL | Freq: Once | INTRAVENOUS | Status: AC
  Administered 2021-01-06: 500 mL via INTRAVENOUS

## 2021-01-06 MED ORDER — VANCOMYCIN HCL 1250 MG/250ML IV SOLN
1250.0000 mg | Freq: Once | INTRAVENOUS | Status: AC
  Administered 2021-01-06: 1250 mg via INTRAVENOUS
  Filled 2021-01-06: qty 250

## 2021-01-06 MED ORDER — SODIUM CHLORIDE 0.9 % IV SOLN
2.0000 g | Freq: Once | INTRAVENOUS | Status: DC
  Filled 2021-01-06: qty 2

## 2021-01-06 MED ORDER — IOHEXOL 350 MG/ML SOLN
75.0000 mL | Freq: Once | INTRAVENOUS | Status: AC | PRN
  Administered 2021-01-06: 75 mL via INTRAVENOUS

## 2021-01-06 MED ORDER — VANCOMYCIN VARIABLE DOSE PER UNSTABLE RENAL FUNCTION (PHARMACIST DOSING): Status: DC

## 2021-01-06 MED ORDER — NOREPINEPHRINE 4 MG/250ML-% IV SOLN
0.0000 ug/min | INTRAVENOUS | Status: DC
  Administered 2021-01-07: 2 ug/min via INTRAVENOUS
  Filled 2021-01-06 (×3): qty 250

## 2021-01-06 NOTE — ED Triage Notes (Signed)
Pt here  via [facility] from home for generalized weakness and nausea x2 wks. Pt has hx afib, is on eliquis, has a [employer] architect. Pt has seen GI and cardiology for symptoms, plan for cardioversion on 9/27. EMS gave 4mg  zofran IV w/ no relief. Pt denies vomiting or pain. Aox4, VSS. Pt in ventricular paced rhythm 60-110s w/ EMS.

## 2021-01-06 NOTE — Addendum Note (Signed)
Encounter addended by: Rafael Bihari, FNP on: 12/30/2020 8:46 PM  Actions taken: Clinical Note Signed

## 2021-01-06 NOTE — ED Notes (Signed)
Pt in US

## 2021-01-06 NOTE — ED Notes (Signed)
Patient transported to Ultrasound 

## 2021-01-06 NOTE — ED Notes (Signed)
Patient transported to CT 

## 2021-01-06 NOTE — ED Provider Notes (Signed)
[facility] EMERGENCY DEPARTMENT Provider Note   CSN: [id number] Arrival date & time: 01/01/2021  1934     History Chief Complaint  Patient presents with   Weakness   Nausea    Cynthia Frank is a 85 y.o. female.  85 year old female with history of DM, CHF, a fib (on Eliquis), history of ovarian and colon cancer (not currently on chemo), additional history as listed below. [place], had fixed surgically and has had nausea ever since. Presents today with abdominal pain (8/10 currently) and indigestion, points to periumbilical area as to location, does not radiate, no relief with zofran. No vomiting, unable to eat secondary to pain. Pain is constant, sometimes worse after eating. Feeling generally weak from lack of PO intake and hip rehab, weakness is complicating her hip rehab. Denies changes in bowel habits.  Last PO intake today at 11am. Last bowel movement was this morning, normal/non bloody. Scheduled to see GI in 2 days but feels she can't make it. Called 911 for transport to the ER today because she has worsening generalized weakness, abdominal pain, and indigestion off and on all day today, no relief with Pepcid.       Past Medical History:  Diagnosis Date   AICD (automatic cardioverter/defibrillator) present    Arthritis    "left ankle" (07/14/2015)   Atrial fibrillation (HCC)    Cataract    bilateral -not a surgical candidate   CHF (congestive heart failure) ([place])    Colon cancer ([place]) 03/2006   T3, N0   Colon polyps 12/07/2010   Coronary artery disease    nonobstructive with 30% D1   Diabetes mellitus without complication ([place])    on meds   GERD (gastroesophageal reflux disease)    OTC PRN -diet controlled   History of ovarian cancer 1985   Hx: UTI (urinary tract infection)    Hyperlipidemia    on meds   Hypertension    on meds   Internal hemorrhoid    Nonischemic cardiomyopathy ([place])    last  EF assessment 40% by MUGA   Osteoporosis    Prolia once per year   Partial bowel obstruction (HCC)    PVC (premature ventricular contraction)    S/P mitral valve clip implantation 07/23/2020   s/p TEER with a  MitraClip G4 NTW device positioned A2/P2 by Dr. Burt Knack   Thyroid disease    on meds    Patient Active Problem List   Diagnosis Date Noted   Acute blood loss anemia 12/04/2020   Thrombocytopenia ([place]) 12/04/2020   Hip fracture ([place]) 12/02/2020   S/P left total hip arthroplasty 12/02/2020   Nausea 11/24/2020   S/P mitral valve clip implantation 07/23/2020   Heart failure with reduced ejection fraction ([place]) 07/18/2020   Hyponatremia 07/18/2020   Anemia 07/18/2020   Elevated LFTs 07/18/2020   Severe mitral regurgitation    Vertigo 03/27/2019   Orthostatic hypotension    Syncope and collapse    Closed right hip fracture ([place]) 12/16/2018   Hypokalemia 12/16/2018   Dysphoric mood 08/11/2017   Insomnia 04/21/2016   Arthritis 74/25/9563   Chronic systolic CHF (congestive heart failure) ([place]) 06/03/2015   GERD (gastroesophageal reflux disease) 04/21/2015   Personal history of colon cancer    Routine general medical examination at a health care facility 03/24/2015   Nonischemic cardiomyopathy [facility])    Coronary artery disease    Hyperlipidemia    Atrial  fibrillation ([place]) 12/11/2012   Cough 05/16/2012   THYROID NODULE 04/27/2007   Essential hypertension 04/27/2007    Past Surgical History:  Procedure Laterality Date   ABDOMINAL HYSTERECTOMY  1979   ANKLE CLOSED REDUCTION  10/16/2011   Procedure: CLOSED REDUCTION ANKLE;  Surgeon: Tobi Bastos, MD;  Location: [facility] ORS;  Service: Orthopedics;  Laterality: Left;   ATRIAL FIBRILLATION ABLATION N/A 08/14/2019   Procedure: ATRIAL FIBRILLATION ABLATION;  Surgeon: Constance Haw, MD;  Location: [place] CV LAB;  Service: Cardiovascular;  Laterality: N/A;   ATRIAL FIBRILLATION ABLATION N/A 07/15/2020   Procedure: ATRIAL  FIBRILLATION ABLATION;  Surgeon: Constance Haw, MD;  Location: [place] CV LAB;  Service: Cardiovascular;  Laterality: N/A;   CARDIOVERSION N/A 04/20/2020   Procedure: CARDIOVERSION;  Surgeon: Larey Dresser, MD;  Location: [facility] ENDOSCOPY;  Service: Cardiovascular;  Laterality: N/A;   CARDIOVERSION N/A 11/18/2020   Procedure: CARDIOVERSION;  Surgeon: Larey Dresser, MD;  Location: [facility] ENDOSCOPY;  Service: Cardiovascular;  Laterality: N/A;   COLECTOMY  2007   for colon cancer   COLONOSCOPY N/A 03/11/2013   Procedure: COLONOSCOPY;  Surgeon: Ladene Artist, MD;  [facility]: [facility] ENDOSCOPY;  Service: Endoscopy;  Laterality: N/A;   COLONOSCOPY  2019   MS-plenvu(good)-int hems-SSA x 2   COLONOSCOPY WITH PROPOFOL N/A 04/21/2015   Procedure: COLONOSCOPY WITH PROPOFOL;  Surgeon: Ladene Artist, MD;  [facility]: [facility] ENDOSCOPY;  Service: Endoscopy;  Laterality: N/A;   COLONOSCOPY WITH PROPOFOL N/A 02/05/2018   Procedure: COLONOSCOPY WITH PROPOFOL Hemostasis Clip;  Surgeon: Ladene Artist, MD;  [facility] ENDOS[facility];  Service: Endoscopy;  Laterality: N/A;   EP IMPLANTABLE DEVICE N/A 07/14/2015   Procedure: BiV ICD Insertion CRT-D;  Surgeon: Evans Lance, MD;  Location: [place] CV LAB;  Service: Cardiovascular;  Laterality: N/A;   ESOPHAGOGASTRODUODENOSCOPY (EGD) WITH PROPOFOL N/A 04/21/2015   Procedure: ESOPHAGOGASTRODUODENOSCOPY (EGD) WITH PROPOFOL;  Surgeon: Ladene Artist, MD;  [facility] ENDOS[facility];  Service: Endoscopy;  Laterality: N/A;   FRACTURE SURGERY     INSERTION OF ICD Left 07/14/2015   LAPAROTOMY  1980   "adhesions"   LAPAROTOMY  1989   laparotomy for takedown of intestinal obstruction secondary to adhesions    MITRAL VALVE REPAIR N/A 07/23/2020   Procedure: MITRAL VALVE REPAIR;  Surgeon: Sherren Mocha, MD;  Location: [place] CV LAB;  Service: Cardiovascular;  Laterality: N/A;   [address]   resection of right ovarian cancer   POLYPECTOMY   02/05/2018   Procedure: POLYPECTOMY;  Surgeon: Ladene Artist, MD;  [facility] ENDOSCOPY;  Service: Endoscopy;;   POLYPECTOMY  2018   SSA x 2   RIGHT HEART CATH N/A 07/22/2020   Procedure: RIGHT HEART CATH;  Surgeon: Larey Dresser, MD;  Location: [place] CV LAB;  Service: Cardiovascular;  Laterality: N/A;   SMALL INTESTINE SURGERY  1985   TEE WITHOUT CARDIOVERSION N/A 08/14/2019   Procedure: TRANSESOPHAGEAL ECHOCARDIOGRAM (TEE);  Surgeon: Constance Haw, MD;  Location: [place] CV LAB;  Service: Cardiovascular;  Laterality: N/A;   TEE WITHOUT CARDIOVERSION N/A 07/15/2020   Procedure: TRANSESOPHAGEAL ECHOCARDIOGRAM (TEE);  Surgeon: Constance Haw, MD;  Location: [place] CV LAB;  Service: Cardiovascular;  Laterality: N/A;   TEE WITHOUT CARDIOVERSION N/A 07/23/2020   Procedure: TRANSESOPHAGEAL ECHOCARDIOGRAM (TEE);  Surgeon: Sherren Mocha, MD;  Location: [place] CV LAB;  Service: Cardiovascular;  Laterality: N/A;   TONSILLECTOMY  1944  TOTAL HIP ARTHROPLASTY Right 12/18/2018   Procedure: TOTAL HIP ARTHROPLASTY ANTERIOR APPROACH;  Surgeon: Rod Can, MD;  [facility]: [facility];  Service: Orthopedics;  Laterality: Right;   TOTAL HIP ARTHROPLASTY Left 12/02/2020   Procedure: TOTAL HIP ARTHROPLASTY ANTERIOR APPROACH;  Surgeon: Paralee Cancel, MD;  [facility];  Service: Orthopedics;  Laterality: Left;     OB History   No obstetric history on file.     Family History  Problem Relation Age of Onset   Uterine cancer Mother    Prostate cancer Father    Heart disease Father    Heart attack Father    Colon cancer Neg Hx    Colon polyps Neg Hx    Esophageal cancer Neg Hx    Rectal cancer Neg Hx    Stomach cancer Neg Hx     Social History   Tobacco Use   Smoking status: Former    Packs/day: 0.50    Years: 20.00    Pack years: 10.00    Types: Cigarettes    Quit date: 04/18/1978    Years since quitting: 42.7   Smokeless tobacco: Never  Vaping Use    Vaping Use: Never used  Substance Use Topics   Alcohol use: Yes    Alcohol/week: 14.0 standard drinks    Types: 14 Standard drinks or equivalent per week    Comment: has wine before dinner    Drug use: No    Home Medications Prior to Admission medications   Medication Sig Start Date End Date Taking? Authorizing Provider  amiodarone (PACERONE) 200 MG tablet Take 1 tablet (200 mg total) by mouth daily. 12/18/20   Larey Dresser, MD  amoxicillin (AMOXIL) 500 MG tablet Take 4 tablets (2,000 mg total) by mouth once as needed for up to 1 dose (take 1 hour before dental procedure). Patient not taking: Reported on 12/29/2020 10/05/20   Rafael Bihari, FNP  apixaban (ELIQUIS) 2.5 MG TABS tablet TAKE 1 TABLET(2.5 MG) BY MOUTH TWICE DAILY Patient taking differently: Take 2.5 mg by mouth 2 (two) times daily. 11/09/20   Camnitz, Will Hassell Done, MD  atorvastatin (LIPITOR) 40 MG tablet TAKE 1 TABLET(40 MG) BY MOUTH DAILY AT 6 PM 12/08/20   Camnitz, Ocie Doyne, MD  citalopram (CELEXA) 20 MG tablet Take 20 mg by mouth daily.    [provider]  Coenzyme Q10 (COQ10) 200 MG CAPS Take 200 mg by mouth daily.    [provider]  docusate sodium (COLACE) 100 MG capsule Take 100 mg by mouth daily.    [provider]  FARXIGA 10 MG TABS tablet TAKE 1 TABLET(10 MG) BY MOUTH DAILY BEFORE BREAKFAST Patient taking differently: Take 10 mg by mouth daily. 10/12/20   Larey Dresser, MD  furosemide (LASIX) 40 MG tablet Take 1 tablet (40 mg total) by mouth daily. 07/25/20   Clegg, Amy D, NP  HYDROcodone-acetaminophen (NORCO/VICODIN) 5-325 MG tablet Take 1-2 tablets by mouth every 6 (six) hours as needed for severe pain. 12/04/20   Irving Copas, PA-C  ipratropium (ATROVENT) 0.03 % nasal spray Place 1 spray into both nostrils 2 (two) times daily. 04/26/20   [provider]  levothyroxine (SYNTHROID) 25 MCG tablet TAKE 1 TABLET(25 MCG) BY MOUTH DAILY BEFORE BREAKFAST 10/28/20   Larey Dresser, MD  Magnesium Oxide 500 MG CAPS Take 1,000 mg by mouth 2 (two) times daily.     [provider]  methocarbamol (ROBAXIN) 500 MG tablet Take 1 tablet (500  mg total) by mouth every 6 (six) hours as needed for muscle spasms. 12/04/20   Irving Copas, PA-C  ondansetron (ZOFRAN) 4 MG tablet Take 1 tablet (4 mg total) by mouth every 6 (six) hours as needed for nausea or vomiting. 12/04/20   Irving Copas, PA-C  polyethylene glycol (MIRALAX / GLYCOLAX) 17 g packet Take 17 g by mouth daily as needed for mild constipation. 12/04/20   Irving Copas, PA-C  potassium chloride SA (KLOR-CON) 20 MEQ tablet Take 1 tablet (20 mEq total) by mouth daily. 11/12/20   Larey Dresser, MD  zolpidem (AMBIEN) 5 MG tablet Take 1 tablet (5 mg total) by mouth at bedtime as needed for sleep. 08/05/20   [name], MD    Allergies    Clindamycin/lincomycin, Dramamine ii [meclizine hcl], Pneumococcal vaccines, and Sulfonamide derivatives  Review of Systems   Review of Systems  Constitutional:  Negative for chills and fever.  Respiratory:  Negative for shortness of breath.   Cardiovascular:  Negative for chest pain.  Gastrointestinal:  Positive for abdominal pain and nausea. Negative for blood in stool, constipation, diarrhea and vomiting.  Genitourinary:  Negative for dysuria.  Musculoskeletal:  Negative for arthralgias and myalgias.  Skin:  Negative for rash and wound.  Allergic/Immunologic: Positive for immunocompromised state.  Neurological:  Positive for weakness.  Hematological:  Negative for adenopathy.  Psychiatric/Behavioral:  Negative for confusion.   All other systems reviewed and are negative.  Physical Exam Updated Vital Signs BP (!) 80/67   Pulse 93   Temp 97.8 F (36.6 C) (Oral)   Resp (!) 21   Ht 5\' 4"  (1.626 m)   Wt 55 kg   SpO2 100%   BMI 20.81 kg/m   Physical Exam Vitals and nursing note reviewed.  Constitutional:      General: She is not in acute  distress.    Appearance: She is well-developed. She is not diaphoretic.  HENT:     Head: Normocephalic and atraumatic.     Mouth/Throat:     Mouth: Mucous membranes are dry.  Eyes:     Conjunctiva/sclera: Conjunctivae normal.  Cardiovascular:     Rate and Rhythm: Normal rate. Rhythm irregular.     Pulses: Normal pulses.     Heart sounds: Normal heart sounds.  Pulmonary:     Effort: Pulmonary effort is normal.     Breath sounds: Normal breath sounds.  Abdominal:     Palpations: Abdomen is soft.     Tenderness: There is abdominal tenderness in the periumbilical area. There is no right CVA tenderness or left CVA tenderness.  Musculoskeletal:     Cervical back: Neck supple.     Right lower leg: No edema.     Left lower leg: No edema.  Skin:    General: Skin is warm and dry.     Findings: No erythema or rash.  Neurological:     Mental Status: She is alert and oriented to person, place, and time.  Psychiatric:        Behavior: Behavior normal.    ED Results / Procedures / Treatments   Labs (all labs ordered are listed, but only abnormal results are displayed) Labs Reviewed  CBC WITH DIFFERENTIAL/PLATELET - Abnormal; Notable for the following components:      Result Value   RBC 3.83 (*)    Hemoglobin 11.5 (*)    RDW 16.1 (*)    nRBC 0.3 (*)    All other components within  normal limits  COMPREHENSIVE METABOLIC PANEL - Abnormal; Notable for the following components:   Sodium 128 (*)    Chloride 94 (*)    CO2 14 (*)    Glucose, Bld 134 (*)    BUN 25 (*)    Creatinine, Ser 1.75 (*)    Calcium 8.1 (*)    Total Protein 6.1 (*)    Albumin 3.0 (*)    AST 194 (*)    ALT 140 (*)    Total Bilirubin 2.2 (*)    GFR, Estimated 29 (*)    Anion gap 20 (*)    All other components within normal limits  LACTIC ACID, PLASMA - Abnormal; Notable for the following components:   Lactic Acid, Venous 9.3 (*)    All other components within normal limits  RESP PANEL BY RT-PCR (FLU A&B,  COVID) ARPGX2  CULTURE, BLOOD (ROUTINE X 2)  CULTURE, BLOOD (ROUTINE X 2)  URINE CULTURE  URINALYSIS, ROUTINE W REFLEX MICROSCOPIC  LACTIC ACID, PLASMA  LIPASE, BLOOD  PROCALCITONIN  MAGNESIUM  PROTIME-INR  APTT  TROPONIN I (HIGH SENSITIVITY)  TROPONIN I (HIGH SENSITIVITY)    EKG EKG Interpretation  Date/Time:  Wednesday January 06 2021 19:47:30 EDT Ventricular Rate:  86 PR Interval:    QRS Duration: 192 QT Interval:  506 QTC Calculation: 605 R Axis:   -76 Text Interpretation: Ventricular-paced rhythm Biventricular pacemaker detected Abnormal ECG Confirmed by Thamas Jaegers ([id number]) on 01/05/2021 11:40:04 PM  Radiology DG Chest 2 View  Result Date: 12/20/2020 CLINICAL DATA:  Chest pain EXAM: CHEST - 2 VIEW COMPARISON:  07/18/2020, CT from earlier in the same day. FINDINGS: Cardiac shadow is enlarged but stable. Defibrillator is again noted. Mitral clip is seen new from the prior exam. Lungs are well aerated bilaterally. Chronic appearing blunting of left costophrenic angle is noted likely related to scarring. Minimal blunting of the right costophrenic angle is seen. Mild associated atelectatic changes are noted. IMPRESSION: Small right pleural effusion with associated atelectatic changes. Chronic blunting of left costophrenic angle consistent with scarring. Electronically Signed   By: Inez Catalina M.D.   On: 01/05/2021 23:08   CT Abdomen Pelvis W Contrast  Result Date: 12/28/2020 CLINICAL DATA:  Abdominal pain. EXAM: CT ABDOMEN AND PELVIS WITH CONTRAST TECHNIQUE: Multidetector CT imaging of the abdomen and pelvis was performed using the standard protocol following bolus administration of intravenous contrast. CONTRAST:  75mL OMNIPAQUE IOHEXOL 350 MG/ML SOLN COMPARISON:  CT abdomen pelvis dated 11/05/2015. FINDINGS: Lower chest: Partially visualized small bilateral pleural effusions with associated partial compressive atelectasis of the lower lobes. There is moderate cardiomegaly.  Cardiac pacemaker wires. There is retrograde flow of contrast from the right atrium into the IVC consistent with right heart dysfunction. No intra-abdominal free air.  Small free fluid in the pelvis. Hepatobiliary: There is heterogeneous appearance of the liver, likely related to passive congestion. Correlation with LFTs recommended there is mild periportal edema. The gallbladder is predominantly contracted. High attenuating content within the gallbladder may represent sludge, small stones, or vicarious excretion of contrast. Pancreas: Unremarkable. No pancreatic ductal dilatation or surrounding inflammatory changes. Spleen: Normal in size without focal abnormality. Adrenals/Urinary Tract: The right adrenal gland is not visualized. The left adrenal gland is unremarkable. There is no hydronephrosis on either side. Areas of cortical scarring and parenchyma atrophy. The urinary bladder is poorly visualized due to streak artifact caused by bilateral hip arthroplasties. Stomach/Bowel: Postsurgical changes of bowel with anastomotic suture in the right lower quadrant. There is no  bowel obstruction. Vascular/Lymphatic: Mild aortoiliac atherosclerotic disease. The IVC is unremarkable. No portal venous gas. There is no adenopathy. Reproductive: Hysterectomy Other: None Musculoskeletal: Bilateral total hip arthroplasties. Osteopenia with degenerative changes of the spine. No acute osseous pathology. IMPRESSION: 1. No acute intra-abdominal or pelvic pathology. No bowel obstruction. 2. Moderate cardiomegaly with evidence of right heart dysfunction. 3. Heterogeneous appearance of the liver, likely related to passive congestion secondary to heart failure. 4. Partially visualized small bilateral pleural effusions with associated partial compressive atelectasis of the lower lobes. 5. Aortic Atherosclerosis (ICD10-I70.0). Electronically Signed   By: Anner Crete M.D.   On: 12/21/2020 21:32   US Abdomen Limited  Result Date:  12/27/2020 CLINICAL DATA:  Elevated liver function tests. EXAM: ULTRASOUND ABDOMEN LIMITED RIGHT UPPER QUADRANT COMPARISON:  March 22, 2019 FINDINGS: Gallbladder: No gallstones or wall thickening visualized (2.8 mm). No sonographic Talyn Dessert sign noted by sonographer. Common bile duct: Diameter: 1.7 mm Liver: No focal lesion identified. Diffusely increased echogenicity of the liver parenchyma is seen. Portal vein is patent on color Doppler imaging with normal direction of blood flow towards the liver. Other: Of incidental note is the presence of a right-sided pleural effusion. IMPRESSION: 1. Hepatic steatosis. 2. Right pleural effusion. Electronically Signed   By: Virgina Norfolk M.D.   On: 12/31/2020 23:41    Procedures .Critical Care Performed by: Tacy Learn, PA-C Authorized by: Tacy Learn, PA-C   Critical care provider statement:    Critical care time (minutes):  45   Critical care was time spent personally by me on the following activities:  Discussions with consultants, evaluation of patient's response to treatment, examination of patient, ordering and performing treatments and interventions, ordering and review of laboratory studies, ordering and review of radiographic studies, pulse oximetry, re-evaluation of patient's condition, obtaining history from patient or surrogate and review of old charts   Medications Ordered in ED Medications  vancomycin (VANCOREADY) IVPB 1250 mg/250 mL (1,250 mg Intravenous New Bag/Given 12/17/2020 2346)  vancomycin variable dose per unstable renal function (pharmacist dosing) (has no administration in time range)  ceFEPIme (MAXIPIME) 2 g in sodium chloride 0.9 % 100 mL IVPB (0 g Intravenous Stopped 02-05-2021 0000)  norepinephrine (LEVOPHED) 4mg  in 234mL premix infusion (2 mcg/min Intravenous New Bag/Given 2021-02-05 0002)  sodium chloride 0.9 % bolus 500 mL (0 mLs Intravenous Stopped 01/09/2021 2052)  iohexol (OMNIPAQUE) 350 MG/ML injection 75 mL (75 mLs  Intravenous Contrast Given 12/22/2020 2122)  sodium chloride 0.9 % bolus 500 mL (0 mLs Intravenous Stopped 12/19/2020 2341)  sodium chloride 0.9 % bolus 250 mL (0 mLs Intravenous Stopped 2021-02-05 0000)    ED Course  I have reviewed the triage vital signs and the nursing notes.  Pertinent labs & imaging results that were available during my care of the patient were reviewed by me and considered in my medical decision making (see chart for details).  Clinical Course as of 02-05-21 0009  Wed Jan 07, 5995  [id number] 85 year old female brought in by EMS from home with complaint of periumbilical abdominal pain and indigestion.  Patient states that she has had nausea ever since she fractured her hip 1 month ago.  Has resulted in decrease in p.o. intake, progressively becoming more weak. Patient was seen in clinic today with cardiology, in [place].  Flutter on device interrogation since December 07, 2020.  Plan for cardioversion next Tuesday. Patient was advised to take additional dose of her Lasix today as well as additional dose of potassium, she has  not done so as of yet.  Patient had labs drawn but did not get any results.  Called 911 due to worsening generalized weakness and having indigestion today. On exam, found of mild periumbilical tenderness.  No CVA tenderness.  Patient is overall well-appearing, does have dry mucous membranes. Review of labs from earlier today, AKI with creatinine of 1.3, previously 0.7.  Patient was given 500 cc IV bolus, does have history of CHF. Plan is to recheck labs, obtain CT abdomen pelvis. [LM]  2046 Also of note, patient was hypotensive and tachycardic in clinic today.  Blood pressure improved to 102/62 on arrival with rate of 83.  O2 sat 100% on room air. [LM]  2326 Repeat BP 87G systolic. Lactic 9.3. Afebrile, normal WBC, not tachycardic (although was tachycardic and hypotensive at cardiology office today). Discussed with Dr. Almyra Free, [place] attending, plan is for fluid bolus per sepsis  orders with antibiotics pending further work up and monitoring.   CMP with Cr 1.75, previously 0.7. GFR 29, CO2 14 with gap 20. LFTs elevated at AST 194, ALT 140, bili 2.2.  Na 128, not significantly changed from prior.  COVID/flu negative. [LM]  2327 CT without acute intra-abdominal pathology, no bowel obstruction. Moderate cardiomegaly with right heart dysfunction. Partially visualized small bilateral pleural effusions with associated partial compressive atelectasis of the lower lobes. [LM]  8115 BWI of RUQ [place] for elevated LFTs and abdominal pain. Lipase pending. As well as repeat lactic. Troponin ordered for complaint of indigestion intermittent today without relief with Pepcid.  [LM]  2035 BP remains 78 systolic after fluid bolus, patient seen by Dr. Almyra Free, recommends levophed and consult to critical care. [LM]  01/31/2021  0005 Case discussed with Dr. Carson Myrtle with critical care, troponin has been ordered, result pending. Critical care to see patient for admission.  [LM]    Clinical Course User Index [LM] Roque Lias   MDM Rules/Calculators/A&P                           Final Clinical Impression(s) / ED Diagnoses Final diagnoses:  Periumbilical abdominal pain  Nausea  Hypotension, unspecified hypotension type  Lactic acidosis  Elevated LFTs  AKI (acute kidney injury) [facility])    Rx / DC Orders ED Discharge Orders     None        Roque Lias 2021/01/31 0010    Luna Fuse, MD 01/20/21 1700

## 2021-01-06 NOTE — Progress Notes (Signed)
Pharmacy Antibiotic Note  Cynthia Frank is a 85 y.o. female admitted on 01/02/2021 with sepsis.  Pharmacy has been consulted for vancomycin and cefepime dosing.  Patient's serum creatinine is 1.75 which is significantly above baseline.  Plan: Cefepime 2 g q24h Vancomycin 1250 mg once, subsequent dosing as indicated per random vancomycin level until renal function stable and/or improved, at which time scheduled dosing can be considered Follow-up cultures and de-escalate antibiotics as appropriate.  Height: 5\' 4"  (162.6 cm) Weight: 55 kg (121 lb 4.1 oz) IBW/kg (Calculated) : 54.7  Temp (24hrs), Avg:97.8 F (36.6 C), Min:97.8 F (36.6 C), Max:97.8 F (36.6 C)  Recent Labs  Lab 12/21/2020 1214 12/18/2020 2009  WBC 6.5 7.6  CREATININE 1.38*  --   LATICACIDVEN  --  9.3*    Estimated Creatinine Clearance: 26.7 mL/min (A) (by C-G formula based on SCr of 1.38 mg/dL (H)).    Allergies  Allergen Reactions   Clindamycin/Lincomycin Itching   Dramamine Ii [Meclizine Hcl] Other (See Comments)    Hyperactivity   Pneumococcal Vaccines Itching and Swelling    Redness    Sulfonamide Derivatives Itching and Swelling   Antimicrobials this admission: cefepime 9/21 >>  vancomycin 9/21 >>   Microbiology results: Pending  Thank you for allowing pharmacy to be a part of this patient's care.  Lorelei Pont, PharmD, BCPS 12/25/2020 10:08 PM ED Clinical Pharmacist -  [phone]

## 2021-01-06 NOTE — Patient Instructions (Signed)
Be sure to take an additional tablet of Lasix and Potassium today. Resume normal doses 01-08-2021  Labs today We will only contact you if something comes back abnormal or we need to make some changes. Otherwise no news is good news!  Your physician has recommended that you have a Cardioversion (DCCV). Electrical Cardioversion uses a jolt of electricity to your heart either through paddles or wired patches attached to your chest. This is a controlled, usually prescheduled, procedure. Defibrillation is done under light anesthesia in the hospital, and you usually go home the day of the procedure. This is done to get your heart back into a normal rhythm. You are not awake for the procedure. Please see the instruction sheet given to you today.   Your physician recommends that you schedule a follow-up appointment in: 2-3 weeks  in the [facility]      You are scheduled for a TEE/Cardioversion/TEE Cardioversion on 01/12/2021 with Dr. Aundra Dubin.  Please arrive at the [facility] (Main Entrance A) at [facility]: [address] [place], [place] [address] at 8:00 am.   DIET: Nothing to eat or drink after midnight except a sip of water with medications (see medication instructions below)  Medication Instructions: Hold Lasix on 01/12/2021, the day of your procedure  Continue your anticoagulant: Eliquis You will need to continue your anticoagulant after your procedure until you  are told by your  Provider that it is safe to stop   Labs: pre procedure labs done 12/29/2020    You must have a responsible person to drive you home and stay in the waiting area during your procedure. Failure to do so could result in cancellation.  Bring your insurance cards.  *Special Note: Every effort is made to have your procedure done on time. Occasionally there are emergencies that occur at the hospital that may cause delays. Please be patient if a delay does occur.

## 2021-01-06 NOTE — ED Notes (Signed)
RN notified of critical lactic acid 9.3, Hong MD and [facility] aware.

## 2021-01-07 ENCOUNTER — Inpatient Hospital Stay (HOSPITAL_COMMUNITY): Payer: Medicare Other

## 2021-01-07 ENCOUNTER — Inpatient Hospital Stay: Payer: Self-pay

## 2021-01-07 DIAGNOSIS — E872 Acidosis: Secondary | ICD-10-CM | POA: Diagnosis present

## 2021-01-07 DIAGNOSIS — E875 Hyperkalemia: Secondary | ICD-10-CM | POA: Diagnosis not present

## 2021-01-07 DIAGNOSIS — Z90710 Acquired absence of both cervix and uterus: Secondary | ICD-10-CM | POA: Diagnosis not present

## 2021-01-07 DIAGNOSIS — I517 Cardiomegaly: Secondary | ICD-10-CM | POA: Diagnosis not present

## 2021-01-07 DIAGNOSIS — E871 Hypo-osmolality and hyponatremia: Secondary | ICD-10-CM | POA: Diagnosis present

## 2021-01-07 DIAGNOSIS — I110 Hypertensive heart disease with heart failure: Secondary | ICD-10-CM | POA: Diagnosis present

## 2021-01-07 DIAGNOSIS — N179 Acute kidney failure, unspecified: Secondary | ICD-10-CM

## 2021-01-07 DIAGNOSIS — R110 Nausea: Secondary | ICD-10-CM | POA: Diagnosis not present

## 2021-01-07 DIAGNOSIS — E785 Hyperlipidemia, unspecified: Secondary | ICD-10-CM | POA: Diagnosis present

## 2021-01-07 DIAGNOSIS — E039 Hypothyroidism, unspecified: Secondary | ICD-10-CM | POA: Diagnosis present

## 2021-01-07 DIAGNOSIS — J9601 Acute respiratory failure with hypoxia: Secondary | ICD-10-CM | POA: Diagnosis present

## 2021-01-07 DIAGNOSIS — E861 Hypovolemia: Secondary | ICD-10-CM | POA: Diagnosis present

## 2021-01-07 DIAGNOSIS — R7989 Other specified abnormal findings of blood chemistry: Secondary | ICD-10-CM

## 2021-01-07 DIAGNOSIS — R570 Cardiogenic shock: Secondary | ICD-10-CM | POA: Diagnosis present

## 2021-01-07 DIAGNOSIS — Z66 Do not resuscitate: Secondary | ICD-10-CM | POA: Diagnosis present

## 2021-01-07 DIAGNOSIS — I5023 Acute on chronic systolic (congestive) heart failure: Secondary | ICD-10-CM | POA: Diagnosis present

## 2021-01-07 DIAGNOSIS — N390 Urinary tract infection, site not specified: Secondary | ICD-10-CM

## 2021-01-07 DIAGNOSIS — I5084 End stage heart failure: Secondary | ICD-10-CM | POA: Diagnosis present

## 2021-01-07 DIAGNOSIS — E119 Type 2 diabetes mellitus without complications: Secondary | ICD-10-CM | POA: Diagnosis present

## 2021-01-07 DIAGNOSIS — G9340 Encephalopathy, unspecified: Secondary | ICD-10-CM | POA: Diagnosis present

## 2021-01-07 DIAGNOSIS — K760 Fatty (change of) liver, not elsewhere classified: Secondary | ICD-10-CM | POA: Diagnosis present

## 2021-01-07 DIAGNOSIS — Z8616 Personal history of COVID-19: Secondary | ICD-10-CM | POA: Diagnosis not present

## 2021-01-07 DIAGNOSIS — I428 Other cardiomyopathies: Secondary | ICD-10-CM | POA: Diagnosis present

## 2021-01-07 DIAGNOSIS — Z85038 Personal history of other malignant neoplasm of large intestine: Secondary | ICD-10-CM | POA: Diagnosis not present

## 2021-01-07 DIAGNOSIS — K219 Gastro-esophageal reflux disease without esophagitis: Secondary | ICD-10-CM | POA: Diagnosis present

## 2021-01-07 DIAGNOSIS — Z8543 Personal history of malignant neoplasm of ovary: Secondary | ICD-10-CM | POA: Diagnosis not present

## 2021-01-07 DIAGNOSIS — Z515 Encounter for palliative care: Secondary | ICD-10-CM | POA: Diagnosis not present

## 2021-01-07 DIAGNOSIS — I5020 Unspecified systolic (congestive) heart failure: Secondary | ICD-10-CM | POA: Diagnosis not present

## 2021-01-07 DIAGNOSIS — R579 Shock, unspecified: Secondary | ICD-10-CM

## 2021-01-07 DIAGNOSIS — I4819 Other persistent atrial fibrillation: Secondary | ICD-10-CM | POA: Diagnosis present

## 2021-01-07 LAB — URINALYSIS, ROUTINE W REFLEX MICROSCOPIC
Bilirubin Urine: NEGATIVE
Glucose, UA: NEGATIVE mg/dL
Ketones, ur: NEGATIVE mg/dL
Leukocytes,Ua: NEGATIVE
Nitrite: NEGATIVE
Protein, ur: 30 mg/dL — AB
Specific Gravity, Urine: <1.005 — ABNORMAL LOW (ref 1.005–1.030)
pH: 6 (ref 5.0–8.0)

## 2021-01-07 LAB — COMPREHENSIVE METABOLIC PANEL
ALT: 143 U/L — ABNORMAL HIGH (ref 0–44)
AST: 197 U/L — ABNORMAL HIGH (ref 15–41)
Albumin: 3 g/dL — ABNORMAL LOW (ref 3.5–5.0)
Alkaline Phosphatase: 90 U/L (ref 38–126)
Anion gap: 23 — ABNORMAL HIGH (ref 5–15)
BUN: 25 mg/dL — ABNORMAL HIGH (ref 8–23)
CO2: 9 mmol/L — ABNORMAL LOW (ref 22–32)
Calcium: 8 mg/dL — ABNORMAL LOW (ref 8.9–10.3)
Chloride: 97 mmol/L — ABNORMAL LOW (ref 98–111)
Creatinine, Ser: 1.61 mg/dL — ABNORMAL HIGH (ref 0.44–1.00)
GFR, Estimated: 32 mL/min — ABNORMAL LOW (ref >60)
Glucose, Bld: 113 mg/dL — ABNORMAL HIGH (ref 70–99)
Potassium: 4.6 mmol/L (ref 3.5–5.1)
Sodium: 129 mmol/L — ABNORMAL LOW (ref 135–145)
Total Bilirubin: 2.2 mg/dL — ABNORMAL HIGH (ref 0.3–1.2)
Total Protein: 5.9 g/dL — ABNORMAL LOW (ref 6.5–8.1)

## 2021-01-07 LAB — PROCALCITONIN: Procalcitonin: 0.12 ng/mL

## 2021-01-07 LAB — COOXEMETRY PANEL
Carboxyhemoglobin: 0.5 % (ref 0.5–1.5)
Methemoglobin: 1.2 % (ref 0.0–1.5)
O2 Saturation: 41.1 %
Total hemoglobin: 11.2 g/dL — ABNORMAL LOW (ref 12.0–16.0)

## 2021-01-07 LAB — LACTIC ACID, PLASMA
Lactic Acid, Venous: 8.1 mmol/L (ref 0.5–1.9)
Lactic Acid, Venous: 9.6 mmol/L (ref 0.5–1.9)
Lactic Acid, Venous: >11 mmol/L (ref 0.5–1.9)

## 2021-01-07 LAB — CBC
HCT: 40 % (ref 36.0–46.0)
Hemoglobin: 12.5 g/dL (ref 12.0–15.0)
MCH: 30.2 pg (ref 26.0–34.0)
MCHC: 31.3 g/dL (ref 30.0–36.0)
MCV: 96.6 fL (ref 80.0–100.0)
Platelets: 132 10*3/uL — ABNORMAL LOW (ref 150–400)
RBC: 4.14 MIL/uL (ref 3.87–5.11)
RDW: 16.1 % — ABNORMAL HIGH (ref 11.5–15.5)
WBC: 12.6 10*3/uL — ABNORMAL HIGH (ref 4.0–10.5)
nRBC: 0.2 % (ref 0.0–0.2)

## 2021-01-07 LAB — TROPONIN I (HIGH SENSITIVITY)
Troponin I (High Sensitivity): 27 ng/L — ABNORMAL HIGH (ref <18)
Troponin I (High Sensitivity): 33 ng/L — ABNORMAL HIGH (ref <18)

## 2021-01-07 LAB — GLUCOSE, CAPILLARY
Glucose-Capillary: 141 mg/dL — ABNORMAL HIGH (ref 70–99)
Glucose-Capillary: 91 mg/dL (ref 70–99)

## 2021-01-07 LAB — LIPASE, BLOOD: Lipase: 33 U/L (ref 11–51)

## 2021-01-07 LAB — MAGNESIUM
Magnesium: 1.9 mg/dL (ref 1.7–2.4)
Magnesium: 2.1 mg/dL (ref 1.7–2.4)

## 2021-01-07 LAB — URINALYSIS, MICROSCOPIC (REFLEX)

## 2021-01-07 LAB — BRAIN NATRIURETIC PEPTIDE: B Natriuretic Peptide: 2605.4 pg/mL — ABNORMAL HIGH (ref 0.0–100.0)

## 2021-01-07 LAB — PROTIME-INR
INR: 3.6 — ABNORMAL HIGH (ref 0.8–1.2)
Prothrombin Time: 35.5 seconds — ABNORMAL HIGH (ref 11.4–15.2)

## 2021-01-07 LAB — MRSA NEXT GEN BY PCR, NASAL: MRSA by PCR Next Gen: NOT DETECTED

## 2021-01-07 LAB — APTT: aPTT: 43 seconds — ABNORMAL HIGH (ref 24–36)

## 2021-01-07 LAB — PHOSPHORUS: Phosphorus: 4 mg/dL (ref 2.5–4.6)

## 2021-01-07 LAB — TSH: TSH: 5.811 u[IU]/mL — ABNORMAL HIGH (ref 0.350–4.500)

## 2021-01-07 MED ORDER — CHLORHEXIDINE GLUCONATE CLOTH 2 % EX PADS
6.0000 | MEDICATED_PAD | Freq: Every day | CUTANEOUS | Status: DC
  Administered 2021-01-07: 6 via TOPICAL

## 2021-01-07 MED ORDER — INSULIN ASPART 100 UNIT/ML IJ SOLN
0.0000 [IU] | INTRAMUSCULAR | Status: DC
Start: 2021-01-07 — End: 2021-01-07

## 2021-01-07 MED ORDER — SODIUM CHLORIDE 0.9% FLUSH
10.0000 mL | Freq: Two times a day (BID) | INTRAVENOUS | Status: DC
Start: 2021-01-07 — End: 2021-01-07
  Administered 2021-01-07: 30 mL

## 2021-01-07 MED ORDER — MORPHINE SULFATE (PF) 2 MG/ML IV SOLN
2.0000 mg | INTRAVENOUS | Status: DC | PRN
  Filled 2021-01-07: qty 1

## 2021-01-07 MED ORDER — MORPHINE BOLUS VIA INFUSION
5.0000 mg | INTRAVENOUS | Status: DC | PRN
  Filled 2021-01-07: qty 5

## 2021-01-07 MED ORDER — MORPHINE 100MG IN NS 100ML (1MG/ML) PREMIX INFUSION
0.0000 mg/h | INTRAVENOUS | Status: DC
  Administered 2021-01-07: 5 mg/h via INTRAVENOUS
  Filled 2021-01-07: qty 100

## 2021-01-07 MED ORDER — DOBUTAMINE IN D5W 4-5 MG/ML-% IV SOLN
2.5000 ug/kg/min | INTRAVENOUS | Status: DC
  Administered 2021-01-07: 2.5 ug/kg/min via INTRAVENOUS
  Filled 2021-01-07: qty 250

## 2021-01-07 MED ORDER — LIDOCAINE 5 % EX PTCH: 1.0000 | MEDICATED_PATCH | CUTANEOUS | Status: DC

## 2021-01-07 MED ORDER — DEXTROSE-NACL 5-0.9 % IV SOLN: INTRAVENOUS | Status: DC

## 2021-01-07 MED ORDER — SODIUM CHLORIDE 0.9% FLUSH: 10.0000 mL | INTRAVENOUS | Status: DC | PRN

## 2021-01-07 MED ORDER — POLYETHYLENE GLYCOL 3350 17 G PO PACK: 17.0000 g | PACK | Freq: Every day | ORAL | Status: DC | PRN

## 2021-01-07 MED ORDER — DOCUSATE SODIUM 100 MG PO CAPS
100.0000 mg | ORAL_CAPSULE | Freq: Two times a day (BID) | ORAL | Status: DC | PRN
Start: 2021-01-07 — End: 2021-01-07

## 2021-01-07 MED ORDER — PANTOPRAZOLE SODIUM 40 MG IV SOLR: 40.0000 mg | INTRAVENOUS | Status: DC

## 2021-01-07 MED ORDER — PROCHLORPERAZINE EDISYLATE 10 MG/2ML IJ SOLN
10.0000 mg | Freq: Four times a day (QID) | INTRAMUSCULAR | Status: DC | PRN
  Administered 2021-01-07: 10 mg via INTRAVENOUS
  Filled 2021-01-07 (×2): qty 2

## 2021-01-08 ENCOUNTER — Ambulatory Visit: Payer: Medicare Other | Admitting: Gastroenterology

## 2021-01-09 LAB — URINE CULTURE: Culture: >=100000 — AB

## 2021-01-12 ENCOUNTER — Ambulatory Visit (HOSPITAL_COMMUNITY): Admission: RE | Admit: 2021-01-12 | Payer: Medicare Other | Source: Home / Self Care | Admitting: Cardiology

## 2021-01-12 LAB — CULTURE, BLOOD (ROUTINE X 2)
Culture: NO GROWTH
Culture: NO GROWTH
Special Requests: ADEQUATE
Special Requests: ADEQUATE

## 2021-01-12 SURGERY — CARDIOVERSION
Anesthesia: General

## 2021-01-16 NOTE — Progress Notes (Signed)
[place] Notified Dr. Aundra Dubin of low BP 55-60s/20s.  Orders given and initiated to increase Levophed gtt.  Family at bedside awaiting arrival of daugther. CCM service also notified.

## 2021-01-16 NOTE — Consult Note (Addendum)
Advanced Heart Failure Team Consult Note   Primary Physician: [name], MD PCP-Cardiologist:  [name], MD  Reason for Consultation: Cardiogenic shock   HPI:    Cynthia Frank is seen today for evaluation of shock (concern for cardiogenic) at the request of Dr. Ricard Dillon with PCCM.  She is an 85 y.o. female with history of chronic systolic CHF/nonischemic cardiomyopathy, paroxysmal atrial fibrillation, severe MR s/p mitraclip, hx orthostatic hypotension and prior syncopal episodes and prior ovarian and colon cancers. She has had a cardiomyopathy for > 15 years now. Cardiac cath at diagnosis showed mild nonobstructive disease.  Cause has been thought to be Adriamycin; she received this as part of her ovarian cancer treatment.       Cardiac MRI 1/17, showing EF 29% with normal RV.  Septal-lateral dyssynchrony was present and there was a non-coronary pattern of LGE in the septum.  She had Medtronic CRT-D placed in 3/17. Repeat echo 8/17 showed EF 35% with diffuse hypokinesis, normal RV.     On 11/12/17, she stood up, got lightheaded and passed out briefly with a fall.  Her ICD did not discharge.  She was found to have right C5 transverse process fracture.  Toprol XL decreased.  In 8/20, she had orthostatic hypotension resulting in syncope and fall with right hip fracture.  Delene Loll was stopped.    In 4/21, she had atrial fibrillation ablation.  TEE done in 4/21 showed EF 30-35% with global HK.  Amiodarone stopped.  She went into atrial flutter in 11/21 and had DCCV back to NSR in 1/22.  She stayed in NSR for a few weeks but went back in atrial flutter again.  She had COVID-19 in 2/22.  Redo AF ablation in 3/22.    TEE in 3/22 showed EF 20-25%, moderate RV dysfunction, moderate TR, severe MR.  In 4/22, she had Mitraclip placement.  Repeat echo in 5/22 showed EF 20-25%, moderate RV dysfunction, s/p Mitraclip with mean gradient 2 with trivial MR.     Recurrent atrial flutter  in July. Underwent successful DCCV 11/18/20.   Admitted 12/02/20 for total hip repair after sustaining hip fracture after mechanical fall. Was in NSR during this admission. Arlyce Harman stopped due to low BP.    Clinic was notified by device RN that she was back in AFL 08/22. Low-dose amiodarone was started 08/31 with plans to arrange for DCCV.  She was seen in HF clinic for follow-up yesterday am. Noted decreased appetite and nausea. Weak. Device interrogated, still in atrial flutter with decreased BiV pacing. She appeared volume up. Lasix increased for 1 day. DCCV scheduled.   She presented to ED yesterday evening with worsening weakness, abdominal pain and indigestion. Scr 1.75 (baseline 1), HC03 14,  Na 128, AST 194, ALT 140, Lactic acid 9.3. CT A/P with cardiomegaly, heterogenous appearance of liver (likely d/t HF), small bilateral pleural effusions.  Chest x-ray with small right effusion. Became hypotensive. She was given IV fluids with minimal improvement in blood pressure. She was started on norepinephrine and gentle IV fluids (75/hr) as well as cefepime/vancomycin. Admitted to ICU for further management of shock, initially suspected cardiogenic versus septic.  This morning dobutamine was added at 2.5 mcg/kg/min for persistently low MAP.  Lethargic this am. Increased work of breathing.   CARDIAC STUDIES Repeat echo pending  Echo, 08/27/20: LVEF 20-25%, RV fxn moderately reduced, RVSP 35 mmhg, mitra-clip in A2-P2 position with trivial MR, IVC not dilated, small ASD with left to right shunt  Review of Systems: [y] = yes, [ ]  = no  Limited responses d/t lethargy General: Weight gain [ ] ; Weight loss [ ] ; Anorexia [Y]; Fatigue [Y]; Fever [ ] ; Chills [ ] ; Weakness [ ]   Cardiac: Chest pain/pressure [ ] ; Resting SOB [ ] ; Exertional SOB [ ] ; Orthopnea [ ] ; Pedal Edema [ ] ; Palpitations [ ] ; Syncope [ ] ; Presyncope [ ] ; Paroxysmal nocturnal dyspnea[ ]   Pulmonary: Cough [ ] ; Wheezing[ ] ; Hemoptysis[ ] ; Sputum  [ ] ; Snoring [ ]   GI: Vomiting[ ] ; Dysphagia[ ] ; Melena[ ] ; Hematochezia [ ] ; Heartburn[ ] ; Abdominal pain [ ] ; Constipation [ ] ; Diarrhea [ ] ; BRBPR [ ]   GU: Hematuria[ ] ; Dysuria [ ] ; Nocturia[ ]   Vascular: Pain in legs with walking [ ] ; Pain in feet with lying flat [ ] ; Non-healing sores [ ] ; Stroke [ ] ; TIA [ ] ; Slurred speech [ ] ;  Neuro: Headaches[ ] ; Vertigo[ ] ; Seizures[ ] ; Paresthesias[ ] ;Blurred vision [ ] ; Diplopia [ ] ; Vision changes [ ]   Ortho/Skin: Arthritis [ ] ; Joint pain [ ] ; Muscle pain [ ] ; Joint swelling [ ] ; Back Pain [ ] ; Rash [ ]   Psych: Depression[ ] ; Anxiety[ ]   Heme: Bleeding problems [ ] ; Clotting disorders [ ] ; Anemia [ ]   Endocrine: Diabetes [Y]; Thyroid dysfunction[ ]   Home Medications Prior to Admission medications   Medication Sig Start Date End Date Taking? Authorizing Provider  amiodarone (PACERONE) 200 MG tablet Take 1 tablet (200 mg total) by mouth daily. 12/18/20   Larey Dresser, MD  amoxicillin (AMOXIL) 500 MG tablet Take 4 tablets (2,000 mg total) by mouth once as needed for up to 1 dose (take 1 hour before dental procedure). Patient not taking: Reported on 01/11/2021 10/05/20   Rafael Bihari, FNP  apixaban (ELIQUIS) 2.5 MG TABS tablet TAKE 1 TABLET(2.5 MG) BY MOUTH TWICE DAILY Patient taking differently: Take 2.5 mg by mouth 2 (two) times daily. 11/09/20   Camnitz, Will Hassell Done, MD  atorvastatin (LIPITOR) 40 MG tablet TAKE 1 TABLET(40 MG) BY MOUTH DAILY AT 6 PM 12/08/20   Camnitz, Ocie Doyne, MD  citalopram (CELEXA) 20 MG tablet Take 20 mg by mouth daily.    [provider]  Coenzyme Q10 (COQ10) 200 MG CAPS Take 200 mg by mouth daily.    [provider]  docusate sodium (COLACE) 100 MG capsule Take 100 mg by mouth daily.    [provider]  FARXIGA 10 MG TABS tablet TAKE 1 TABLET(10 MG) BY MOUTH DAILY BEFORE BREAKFAST Patient taking differently: Take 10 mg by mouth daily. 10/12/20   Larey Dresser, MD  furosemide (LASIX) 40  MG tablet Take 1 tablet (40 mg total) by mouth daily. 07/25/20   Clegg, Amy D, NP  HYDROcodone-acetaminophen (NORCO/VICODIN) 5-325 MG tablet Take 1-2 tablets by mouth every 6 (six) hours as needed for severe pain. 12/04/20   Irving Copas, PA-C  ipratropium (ATROVENT) 0.03 % nasal spray Place 1 spray into both nostrils 2 (two) times daily. 04/26/20   [provider]  levothyroxine (SYNTHROID) 25 MCG tablet TAKE 1 TABLET(25 MCG) BY MOUTH DAILY BEFORE BREAKFAST 10/28/20   Larey Dresser, MD  Magnesium Oxide 500 MG CAPS Take 1,000 mg by mouth 2 (two) times daily.     [provider]  methocarbamol (ROBAXIN) 500 MG tablet Take 1 tablet (500 mg total) by mouth every 6 (six) hours as needed for muscle spasms. 12/04/20   Irving Copas, PA-C  ondansetron (ZOFRAN) 4 MG  tablet Take 1 tablet (4 mg total) by mouth every 6 (six) hours as needed for nausea or vomiting. 12/04/20   Irving Copas, PA-C  polyethylene glycol (MIRALAX / GLYCOLAX) 17 g packet Take 17 g by mouth daily as needed for mild constipation. 12/04/20   Irving Copas, PA-C  potassium chloride SA (KLOR-CON) 20 MEQ tablet Take 1 tablet (20 mEq total) by mouth daily. 11/12/20   Larey Dresser, MD  zolpidem (AMBIEN) 5 MG tablet Take 1 tablet (5 mg total) by mouth at bedtime as needed for sleep. 08/05/20   [name], MD    Past Medical History: Past Medical History:  Diagnosis Date   AICD (automatic cardioverter/defibrillator) present    Arthritis    "left ankle" (07/14/2015)   Atrial fibrillation (HCC)    Cataract    bilateral -not a surgical candidate   CHF (congestive heart failure) ([place])    Colon cancer ([place]) 03/2006   T3, N0   Colon polyps 12/07/2010   Coronary artery disease    nonobstructive with 30% D1   Diabetes mellitus without complication ([place])    on meds   GERD (gastroesophageal reflux disease)    OTC PRN -diet controlled   History of ovarian cancer 1985   Hx: UTI (urinary tract  infection)    Hyperlipidemia    on meds   Hypertension    on meds   Internal hemorrhoid    Nonischemic cardiomyopathy ([place])    last EF assessment 40% by MUGA   Osteoporosis    Prolia once per year   Partial bowel obstruction (HCC)    PVC (premature ventricular contraction)    S/P mitral valve clip implantation 07/23/2020   s/p TEER with a  MitraClip G4 NTW device positioned A2/P2 by Dr. Burt Knack   Thyroid disease    on meds    Past Surgical History: Past Surgical History:  Procedure Laterality Date   ABDOMINAL HYSTERECTOMY  1979   ANKLE CLOSED REDUCTION  10/16/2011   Procedure: CLOSED REDUCTION ANKLE;  Surgeon: Tobi Bastos, MD;  Location: [facility];  Service: Orthopedics;  Laterality: Left;   ATRIAL FIBRILLATION ABLATION N/A 08/14/2019   Procedure: ATRIAL FIBRILLATION ABLATION;  Surgeon: Constance Haw, MD;  Location: [place] [facility];  Service: Cardiovascular;  Laterality: N/A;   ATRIAL FIBRILLATION ABLATION N/A 07/15/2020   Procedure: ATRIAL FIBRILLATION ABLATION;  Surgeon: Constance Haw, MD;  Location: [place] [facility];  Service: Cardiovascular;  Laterality: N/A;   CARDIOVERSION N/A 04/20/2020   Procedure: CARDIOVERSION;  Surgeon: Larey Dresser, MD;  Location: [facility] ENDOSCOPY;  Service: Cardiovascular;  Laterality: N/A;   CARDIOVERSION N/A 11/18/2020   Procedure: CARDIOVERSION;  Surgeon: Larey Dresser, MD;  Location: [facility] ENDOSCOPY;  Service: Cardiovascular;  Laterality: N/A;   COLECTOMY  2007   for colon cancer   COLONOSCOPY N/A 03/11/2013   Procedure: COLONOSCOPY;  Surgeon: Ladene Artist, MD;  [facility];  Service: Endoscopy;  Laterality: N/A;   COLONOSCOPY  2019   MS-plenvu(good)-int hems-SSA x 2   COLONOSCOPY WITH PROPOFOL N/A 04/21/2015   Procedure: COLONOSCOPY WITH PROPOFOL;  Surgeon: Ladene Artist, MD;  [facility]: [facility];  Service: Endoscopy;  Laterality: N/A;   COLONOSCOPY WITH PROPOFOL N/A 02/05/2018   Procedure: COLONOSCOPY WITH  PROPOFOL Hemostasis Clip;  Surgeon: Ladene Artist, MD;  Location: [facility];  Service: Endoscopy;  Laterality: N/A;   EP IMPLANTABLE DEVICE N/A 07/14/2015   Procedure: BiV ICD Insertion CRT-D;  Surgeon:  Evans Lance, MD;  Location: [place] [facility];  Service: Cardiovascular;  Laterality: N/A;   ESOPHAGOGASTRODUODENOSCOPY (EGD) WITH PROPOFOL N/A 04/21/2015   Procedure: ESOPHAGOGASTRODUODENOSCOPY (EGD) WITH PROPOFOL;  Surgeon: Ladene Artist, MD;  [facility];  Service: Endoscopy;  Laterality: N/A;   FRACTURE SURGERY     INSERTION OF ICD Left 07/14/2015   LAPAROTOMY  1980   "adhesions"   LAPAROTOMY  1989   laparotomy for takedown of intestinal obstruction secondary to adhesions    MITRAL VALVE REPAIR N/A 07/23/2020   Procedure: MITRAL VALVE REPAIR;  Surgeon: Sherren Mocha, MD;  Location: [place] [facility];  Service: Cardiovascular;  Laterality: N/A;   [place]   resection of right ovarian cancer   POLYPECTOMY  02/05/2018   Procedure: POLYPECTOMY;  Surgeon: Ladene Artist, MD;  [facility];  Service: Endoscopy;;   POLYPECTOMY  2018   SSA x 2   RIGHT HEART CATH N/A 07/22/2020   Procedure: RIGHT HEART CATH;  Surgeon: Larey Dresser, MD;  Location: [place] [facility];  Service: Cardiovascular;  Laterality: N/A;   SMALL INTESTINE SURGERY  1985   TEE WITHOUT CARDIOVERSION N/A 08/14/2019   Procedure: TRANSESOPHAGEAL ECHOCARDIOGRAM (TEE);  Surgeon: Constance Haw, MD;  Location: [place] [facility];  Service: Cardiovascular;  Laterality: N/A;   TEE WITHOUT CARDIOVERSION N/A 07/15/2020   Procedure: TRANSESOPHAGEAL ECHOCARDIOGRAM (TEE);  Surgeon: Constance Haw, MD;  Location: [place] [facility];  Service: Cardiovascular;  Laterality: N/A;   TEE WITHOUT CARDIOVERSION N/A 07/23/2020   Procedure: TRANSESOPHAGEAL ECHOCARDIOGRAM (TEE);  Surgeon: Sherren Mocha, MD;  Location: [place] [facility];  Service: Cardiovascular;   Laterality: N/A;   TONSILLECTOMY  1944   TOTAL HIP ARTHROPLASTY Right 12/18/2018   Procedure: TOTAL HIP ARTHROPLASTY ANTERIOR APPROACH;  Surgeon: Rod Can, MD;  [facility]: [facility];  Service: Orthopedics;  Laterality: Right;   TOTAL HIP ARTHROPLASTY Left 12/02/2020   Procedure: TOTAL HIP ARTHROPLASTY ANTERIOR APPROACH;  Surgeon: Paralee Cancel, MD;  Location: [facility];  Service: Orthopedics;  Laterality: Left;    Family History: Family History  Problem Relation Age of Onset   Uterine cancer Mother    Prostate cancer Father    Heart disease Father    Heart attack Father    Colon cancer Neg Hx    Colon polyps Neg Hx    Esophageal cancer Neg Hx    Rectal cancer Neg Hx    Stomach cancer Neg Hx     Social History: Social History   Socioeconomic History   Marital status: Married    Spouse name: Not on file   Number of children: 2   Years of education: Not on file   Highest education level: Not on file  Occupational History   Occupation: retired  Tobacco Use   Smoking status: Former    Packs/day: 0.50    Years: 20.00    Pack years: 10.00    Types: Cigarettes    Quit date: 04/18/1978    Years since quitting: 42.7   Smokeless tobacco: Never  Vaping Use   Vaping Use: Never used  Substance and Sexual Activity   Alcohol use: Yes    Alcohol/week: 14.0 standard drinks    Types: 14 Standard drinks or equivalent per week    Comment: has wine before dinner    Drug use: No   Sexual activity: Not Currently  Other Topics Concern   Not on file  Social History Narrative   Patient had never smoked.   Alcohol use- yes   Daily caffeine use- coffee   Illicit drug use- no   Occupation:retired    Social Determinants of Radio broadcast assistant Strain: Low Risk    Difficulty of Paying Living Expenses: Not hard at all  Food Insecurity: No Food Insecurity   Worried About Charity fundraiser in the Last Year: Never true   Ran Out of Food in the Last Year: Never true  Transportation  Needs: No Transportation Needs   Lack of Transportation (Medical): No   Lack of Transportation (Non-Medical): No  Physical Activity: Not on file  Stress: Not on file  Social Connections: Not on file    Allergies:  Allergies  Allergen Reactions   Clindamycin/Lincomycin Itching   Dramamine Ii [Meclizine Hcl] Other (See Comments)    Hyperactivity   Pneumococcal Vaccines Itching and Swelling    Redness    Sulfonamide Derivatives Itching and Swelling    Objective:    Vital Signs:   Temp:  [94.5 F (34.7 C)-97.8 F (36.6 C)] 97.4 F (36.3 C) (09/22 0500) Pulse Rate:  [61-152] 61 (09/22 0615) Resp:  [17-30] 23 (09/22 0615) BP: (74-142)/(56-115) 87/62 (09/22 0615) SpO2:  [89 %-100 %] 98 % (09/22 0615) Weight:  [52.6 kg-55 kg] 52.6 kg (09/22 0400) Last BM Date: 12/20/2020  Weight change: Filed Weights   01/03/2021 1953 01-22-21 0400  Weight: 55 kg 52.6 kg    Intake/Output:   Intake/Output Summary (Last 24 hours) at 01/22/21 0832 Last data filed at 01-22-21 0600 Gross per 24 hour  Intake 1770.2 ml  Output 10 ml  Net 1760.2 ml      Physical Exam    General: Acutely ill appearing. Respiratory distress with use of accessory muscles to breath. HEENT: normal Neck: + JVD. Carotids 2+ bilat; no bruits. No lymphadenopathy or thyromegaly appreciated. Cor: PMI nondisplaced. Regular rhythm. No rubs, gallops or murmurs. Lungs: Moderate respiratory distress. CTA anteriorly Abdomen: nontender, nondistended. No hepatosplenomegaly. No bruits or masses.  Extremities: no cyanosis, clubbing, rash, 1+ lower extremity edema Neuro: Lethargic but does follow commands and answer questions slowly   Telemetry   Ventricular paced 60s, ? Underlying atrial flutter  EKG    09/21: Ventricular paced 97 bpm, difficult to discern underlying atrial rhythm d/t artifact  Labs   Basic Metabolic Panel: Recent Labs  Lab 12/27/2020 1214 01/10/2021 2009 12/22/2020 2335 22-Jan-2021 0247  NA 126* 128*   --  129*  K 2.9* 4.9  --  4.6  CL 90* 94*  --  97*  CO2 24 14*  --  9*  GLUCOSE 144* 134*  --  113*  BUN 21 25*  --  25*  CREATININE 1.38* 1.75*  --  1.61*  CALCIUM 8.3* 8.1*  --  8.0*  MG  --   --  1.9 2.1  PHOS  --   --   --  4.0    Liver Function Tests: Recent Labs  Lab 01/09/2021 1214 12/23/2020 2009 2021-01-22 0247  AST 197* 194* 197*  ALT 150* 140* 143*  ALKPHOS 101 92 90  BILITOT 2.2* 2.2* 2.2*  PROT 6.4* 6.1* 5.9*  ALBUMIN 3.3* 3.0* 3.0*   Recent Labs  Lab 01/22/2021 0247  LIPASE 33   No results for input(s): AMMONIA in the last 168 hours.  CBC: Recent Labs  Lab 01/02/2021 1214 01/13/2021 2009 01-22-2021 0247  WBC 6.5 7.6 12.6*  NEUTROABS  --  5.9  --  HGB 11.5* 11.5* 12.5  HCT 35.8* 36.7 40.0  MCV 92.3 95.8 96.6  PLT 155 157 132*    Cardiac Enzymes: No results for input(s): CKTOTAL, CKMB, CKMBINDEX, TROPONINI in the last 168 hours.  BNP: BNP (last 3 results) Recent Labs    07/27/20 1050 09/08/20 1119 01-24-2021 0247  BNP 925.9* 1,067.3* 2,605.4*    ProBNP (last 3 results) No results for input(s): PROBNP in the last 8760 hours.   CBG: Recent Labs  Lab 2021/01/24 0244  GLUCAP 141*    Coagulation Studies: Recent Labs    12/30/2020 07-19-33  LABPROT 35.5*  INR 3.6*     Imaging   DG Chest 2 View  Result Date: 12/28/2020 CLINICAL DATA:  Chest pain EXAM: CHEST - 2 VIEW COMPARISON:  07/18/2020, CT from earlier in the same day. FINDINGS: Cardiac shadow is enlarged but stable. Defibrillator is again noted. Mitral clip is seen new from the prior exam. Lungs are well aerated bilaterally. Chronic appearing blunting of left costophrenic angle is noted likely related to scarring. Minimal blunting of the right costophrenic angle is seen. Mild associated atelectatic changes are noted. IMPRESSION: Small right pleural effusion with associated atelectatic changes. Chronic blunting of left costophrenic angle consistent with scarring. Electronically Signed   By: Inez Catalina M.D.   On: 01/05/2021 23:08   CT Abdomen Pelvis W Contrast  Result Date: 01/13/2021 CLINICAL DATA:  Abdominal pain. EXAM: CT ABDOMEN AND PELVIS WITH CONTRAST TECHNIQUE: Multidetector CT imaging of the abdomen and pelvis was performed using the standard protocol following bolus administration of intravenous contrast. CONTRAST:  32mL OMNIPAQUE IOHEXOL 350 MG/ML SOLN COMPARISON:  CT abdomen pelvis dated 11/05/2015. FINDINGS: Lower chest: Partially visualized small bilateral pleural effusions with associated partial compressive atelectasis of the lower lobes. There is moderate cardiomegaly. Cardiac pacemaker wires. There is retrograde flow of contrast from the right atrium into the IVC consistent with right heart dysfunction. No intra-abdominal free air.  Small free fluid in the pelvis. Hepatobiliary: There is heterogeneous appearance of the liver, likely related to passive congestion. Correlation with LFTs recommended there is mild periportal edema. The gallbladder is predominantly contracted. High attenuating content within the gallbladder may represent sludge, small stones, or vicarious excretion of contrast. Pancreas: Unremarkable. No pancreatic ductal dilatation or surrounding inflammatory changes. Spleen: Normal in size without focal abnormality. Adrenals/Urinary Tract: The right adrenal gland is not visualized. The left adrenal gland is unremarkable. There is no hydronephrosis on either side. Areas of cortical scarring and parenchyma atrophy. The urinary bladder is poorly visualized due to streak artifact caused by bilateral hip arthroplasties. Stomach/Bowel: Postsurgical changes of bowel with anastomotic suture in the right lower quadrant. There is no bowel obstruction. Vascular/Lymphatic: Mild aortoiliac atherosclerotic disease. The IVC is unremarkable. No portal venous gas. There is no adenopathy. Reproductive: Hysterectomy Other: None Musculoskeletal: Bilateral total hip arthroplasties. Osteopenia  with degenerative changes of the spine. No acute osseous pathology. IMPRESSION: 1. No acute intra-abdominal or pelvic pathology. No bowel obstruction. 2. Moderate cardiomegaly with evidence of right heart dysfunction. 3. Heterogeneous appearance of the liver, likely related to passive congestion secondary to heart failure. 4. Partially visualized small bilateral pleural effusions with associated partial compressive atelectasis of the lower lobes. 5. Aortic Atherosclerosis (ICD10-I70.0). Electronically Signed   By: Anner Crete M.D.   On: 12/25/2020 21:32   US Abdomen Limited  Result Date: 01/09/2021 CLINICAL DATA:  Elevated liver function tests. EXAM: ULTRASOUND ABDOMEN LIMITED RIGHT UPPER QUADRANT COMPARISON:  March 22, 2019 FINDINGS: Gallbladder: No  gallstones or wall thickening visualized (2.8 mm). No sonographic Murphy sign noted by sonographer. Common bile duct: Diameter: 1.7 mm Liver: No focal lesion identified. Diffusely increased echogenicity of the liver parenchyma is seen. Portal vein is patent on color Doppler imaging with normal direction of blood flow towards the liver. Other: Of incidental note is the presence of a right-sided pleural effusion. IMPRESSION: 1. Hepatic steatosis. 2. Right pleural effusion. Electronically Signed   By: Virgina Norfolk M.D.   On: 01/01/2021 23:41   [place] EKG SITE RITE  Result Date: Jan 22, 2021 If [facility] image not attached, placement could not be confirmed due to current cardiac rhythm.    Medications:     Current Medications:  Chlorhexidine Gluconate Cloth  6 each Topical Daily   insulin aspart  0-9 Units Subcutaneous Q4H   lidocaine  1 patch Transdermal Q24H   pantoprazole (PROTONIX) IV  40 mg Intravenous Q24H   vancomycin variable dose per unstable renal function (pharmacist dosing)   Does not apply See admin instructions    Infusions:  ceFEPime (MAXIPIME) IV Stopped (22-Jan-2021 0000)   dextrose 5 % and 0.9% NaCl 75 mL/hr at 2021/01/22 0600    DOBUTamine 2.5 mcg/kg/min (01/22/21 0617)   norepinephrine (LEVOPHED) Adult infusion 7 mcg/min (01/22/21 0600)      Assessment/Plan  Shock, appears likely cardiogenic maybe component of septic shock: - Presented last night with intractable nausea, abdominal pain weakness X 3 weeks. Progressive decline following hip surgery in August 2022. - Lactic acid 9.3 > 8.1 > 9.6 > 11+ - HC03 14 > 9 - CT A/P with no acute pathology. Urine culture pending. Started on empiric abx with vancomycin and cefepime.  - Longstanding history of chronic systolic HF, EF 09-73%. OptiVol index elevated at office visit yesterday. Recent recurrence of Aflutter.  Ventricular rate not uncontrolled but does have decreased BiV pacing which can lead to HF exacerbation. Concerned abdominal complaints may be d/t low output HF - BNP 2,600 (1000 in May 2022) - Repeat echo pending - On dobutamine 2.5 mcg/kg/min and NE at 8 mcg/min for support. Keep MAP > 70. - PICC line pending, CVP monitoring and Co-ox. Will need to decide on diuretics. D/C IV fluids - Increased work of breathing this am. Agrees to BiPAP. She is a DNR and clearly states that she would not want mechanical ventilation or other aggressive measures if condition continues to deteriorate. Concerned that we may be approaching a palliative situation. Multiple admissions and functional decline over the last year.   2. Acute on chronic systolic CHF:  - Nonischemic cardiomyopathy, probably related to Adriamycin with ovarian cancer treatment.  - Cardiac MRI in 1/17 showed EF 29% with normal RV and septal-lateral dyssynchrony.  There was a non-cardiac pattern of LGE in the septum, cannot rule out prior myocarditis based on this pattern.   - She has a Medtronic CRT-D device. - In 4/22, she had Mitraclip placement.  - Echo 5/22, EF 20-25%, moderate RV dysfunction, s/p Mitraclip with mean gradient 2 with trivial MR.  - Recurrent atrial flutter as above with decreased BiV  pacing. OptiVol index elevated at yesterday's visit.  - No GDMT at this time - had been off spiro and entresto previously d/t orthostatic hypotension  3. Atrial arrhythmias:  - She loses BiV pacing when she goes into atrial arrhythmias, triggering CHF exacerbation.  She had atrial fibrillation ablation in 4/21 and redo AF ablation in 3/22.  She underwent DCCV 8/22, unfortunately she is back in atrial flutter. -  Has been on 200 mg amiodarone daily. Previously had elevated LFTs, suspected to be related to amiodarone.  - Amiodarone gtt while on ionotropes at 30/hr. No bolus. -Anticoagulation with heparin gtt. On apixaban as outpatient  4. Severe MR: - S/p mitraclip April 2022  4. Hypothyroidism:  - Previously suspected to be related to prior amiodarone use.   - Continue Levoxyl.  - TSH 5.8  5. Elevated LFTs - AST 197, ALT 143 - Likely d/t shock  6. AKI  - Baseline Cr less than 1, 1.38 > 1.75 > 1.61 - In setting of hypotension/shock  Length of Stay: 0  FINCH, LINDSAY N, PA-C  21-Jan-2021, 8:32 AM  Advanced Heart Failure Team Pager [phone] (M-F; 7a - 5p)  Please contact [facility] for night-coverage after hours (4p -7a ) and weekends on [email or web address]   Agree with above.   85 y/o woman with advanced systolic HF, severe MR s/p MitraClip, PAF.   Has been struggling with advanced HF for several months. Now admitted with profound cardiogenic shock in setting of recurrent atrial flutter. Lactate > 11. Anuric. Tachypneic and uncomfortable on exam  On NE 40 and dobutamine 2.5  General:  Elderly frail. Tachypneic and uncomfortable  HEENT: normal Neck: supple.JVP to ear Carotids 2+ bilat; no bruits. No lymphadenopathy or thryomegaly appreciated. Cor: PMI nondisplaced. Irregular rate & rhythm. No rubs, gallops or murmurs. Lungs: + crackles Abdomen: soft, nontender, nondistended. No hepatosplenomegaly. No bruits or masses. Good bowel sounds. Extremities: no cyanosis, clubbing, rash,  edema + cool  Neuro: weak but alert  answers questions with nodding head or brief 1-2 word answers  She is terminally-ill with end-stage HF/cardiogenic shock I setting of recurrent AFL. She is not responding well to inotropic support. She does not want intubation or dialysis. She is now full DNR.   I spoke with Dr. Tamala Julian and Dr. Aundra Dubin (who knows her well). Agree with transition to comfort care once family arrives.   CRITICAL CARE Performed by: Glori Bickers  Total critical care time: 40 minutes  Critical care time was exclusive of separately billable procedures and treating other patients.  Critical care was necessary to treat or prevent imminent or life-threatening deterioration.  Critical care was time spent personally by me (independent of midlevel providers or residents) on the following activities: development of treatment plan with patient and/or surrogate as well as nursing, discussions with consultants, evaluation of patient's response to treatment, examination of patient, obtaining history from patient or surrogate, ordering and performing treatments and interventions, ordering and review of laboratory studies, ordering and review of radiographic studies, pulse oximetry and re-evaluation of patient's condition.   Glori Bickers, MD  11:20 AM

## 2021-01-16 NOTE — Progress Notes (Signed)
NAME:  Cynthia Frank, MRN:  [id number], DOB:  [birth year], LOS: 0 ADMISSION DATE:  01/12/2021 CONSULTATION DATE:  12/18/2020 REFERRING MD:  Almyra Free - EDP CHIEF COMPLAINT:  Weakness, nausea, abdominal pain  History of Present Illness:  85 year old woman who presented to [facility] ED via EMS 9/21 for acute onset of abdominal pain in the setting of persistent nausea and weakness. Found to be hypotensive with lactic acidosis, concern for shock. PMHx significant for HTN, HLD, Afib/flutter (on Eliquis, s/p DCCV 11/2020), CAD, HFrEF (LVEF 20-25% 08/2020), NICM, s/p AICD placement, T2DM, hypothyroidism, ovarian/colon CA (not currently on chemo).  Patient presented to her Cardiology clinic 9/21 for routine follow up/device interrogation; found to have Aflutter (since 12/07/2020) and mild volume overload on exam with plan for elective DCCV 9/27. Patient returned home but began to feel unwell with new onset of abdominal pain (8/10, central/periumbilical, dull and nonradiating) superimposed on chronic nausea and weakness. Denied fever/chills, CP/SOB, vomiting/diarrhea or changes in bowel habits. She contacted EMS. On EMS arrival, patient was given Zofran without relief. She was noted to be in a ventricular-paced rhythm with rate 60-110.   On arrival to ED, patient was afebrile with HR 60-110 and hypotension with SBPs 80s. Labs were notable for LA 9.3, normal WBC, Na 128 (baseline low 130s), K 4.9, Cr 1.75 (baseline 0.7-0.9), transaminitis (AST 194/ALT 140), troponin 27 (baseline 25). CT A/P was negative for acute intraabdominal pathology but demonstrated hepatic steatosis and moderate cardiomegaly as well as small bilateral pleural effusions (R > L). Abdominal US showed steatosis but was otherwise unremarkable. Peripheral NE and gentle IVF were started for hypotension, empiric antibiotics (vanc/Cefepime) initiated and cultures collected.  PCCM consulted for admission and management.  Pertinent Medical History:   Past  Medical History:  Diagnosis Date   AICD (automatic cardioverter/defibrillator) present    Arthritis    "left ankle" (07/14/2015)   Atrial fibrillation (HCC)    Cataract    bilateral -not a surgical candidate   CHF (congestive heart failure) ([place])    Colon cancer ([place]) 03/2006   T3, N0   Colon polyps 12/07/2010   Coronary artery disease    nonobstructive with 30% D1   Diabetes mellitus without complication ([place])    on meds   GERD (gastroesophageal reflux disease)    OTC PRN -diet controlled   History of ovarian cancer 1985   Hx: UTI (urinary tract infection)    Hyperlipidemia    on meds   Hypertension    on meds   Internal hemorrhoid    Nonischemic cardiomyopathy ([place])    last EF assessment 40% by MUGA   Osteoporosis    Prolia once per year   Partial bowel obstruction (HCC)    PVC (premature ventricular contraction)    S/P mitral valve clip implantation 07/23/2020   s/p TEER with a  MitraClip G4 NTW device positioned A2/P2 by Dr. Burt Knack   Thyroid disease    on meds   Significant Hospital Events: Including procedures, antibiotic start and stop dates in addition to other pertinent events   9/21 - Presented to [facility] ED via EMS post-Cardiology appointment with new onset abdominal pain and persistent nausea. Found to be hypotensive with elevated LA, c/f shock. Admitted to ICU.  Interim History / Subjective:  PCCM consulted for admission  Objective:  Blood pressure (!) 71/56, pulse 69, temperature (!) 97.4 F (36.3 C), resp. rate (!) 28, height 5\' 4"  (1.626 m), weight 52.6 kg, SpO2 97 %.  Intake/Output Summary (Last 24 hours) at 01/20/21 1000 Last data filed at 01-20-2021 0900 Gross per 24 hour  Intake 1920.81 ml  Output 10 ml  Net 1910.81 ml   Filed Weights   12/22/2020 1953 01/20/2021 0400  Weight: 55 kg 52.6 kg   Physical Examination: General: Chronically and acutely ill appearing elderly F, mild distress HEENT: NCAT pink mm anicteric sclera. Anisocoria   Neuro: AAOx3 following commands  CV: v paced. Cap refill < 3 seconds s1s2  PULM: symmetrical chest expansion, slightly labored on [place]. Rapid rate  GI: soft nd, + bowel sounds  Extremities: Trace BLE edema no acute joint deformtiy  Skin: c/d/cool.   Resolved Hospital Problem List:    Assessment & Plan:   Acute hypoxic respiratory failure -suspected pulm edema in setting of decompensated HF P -pt ok with BiPAP trial if indicated  -DNR/I -dc IVF   Shock - presumed cardiogenic  Lactic acidosis  HFrEF  NICM S/p AICD  CAD  Aib/flutter on eliquis  Extensive cardiac history with HFrEF, NICM, persistent Afib/Flutter with AICD placement, CAD. Seen by Cardiology 9/21 prior to admission for routine follow up/device interrogation and was in [place]. P - pending PICC placement -NE, dobutamine  -cardiac monitoring -cont eliquis -adv HF consult pending -more than likely approaching EOL  Abdominal pain Nausea P - PRN antiemetics  - clears   AKI -possible cardiorenal P -trend renal indicies, UOP   HTN -hold home antihypertensives given shock   DM2 On Farxiga at home - SSI  Hypothyroidism - synthroid   Inadequate PO intake  Physical deconditioning S/p L total hip arthroplasty 11/2020 History of ovarian and colon cancer Not currently on chemotherapy. S/p THA 11/2020 for L hip fracture. Since surgery has felt weak and nauseated leading to poor PO intake. - PT/OT consults as appropriate -pt expressed that she would not want NGT to admitting provider   Goals of Care / DNR status -arrives with DNR/I status -following admission, resp status has started to marginally decline -- likely resp effects of decompensated HF  -trying supportive measures for HF, however it is quite likely there are very limited options and pt is likely nearing end of life -I did approach the topic of palliative care-- pt seems receptive to these discussions should we be unable to make progress with  management of acute processes   Best Practice: (right click and "Reselect all SmartList Selections" daily)   Diet/type: clear liquids, ADAT DVT prophylaxis: DOAC, SCDs GI prophylaxis: N/A Lines: N/A PICc pending  Foley:  N/A Code Status:  DNR Last date of multidisciplinary goals of care discussion [Confirmed with patient at bedside 9/22 early AM that she wishes to be DNR status. Patient states "I do not want any life saving measures". Confirmed that she is ok with current pressors, etc but would not want CPR, intubation/mechanical ventilation or a permanent feeding tube.]  Labs:  CBC: Recent Labs  Lab 01/12/2021 1214 12/17/2020 2009 January 20, 2021 0247  WBC 6.5 7.6 12.6*  NEUTROABS  --  5.9  --   HGB 11.5* 11.5* 12.5  HCT 35.8* 36.7 40.0  MCV 92.3 95.8 96.6  PLT 155 157 269*   Basic Metabolic Panel: Recent Labs  Lab 01/10/2021 1214 12/17/2020 2009 01/14/2021 2335 01-20-21 0247  NA 126* 128*  --  129*  K 2.9* 4.9  --  4.6  CL 90* 94*  --  97*  CO2 24 14*  --  9*  GLUCOSE 144* 134*  --  113*  BUN  21 25*  --  25*  CREATININE 1.38* 1.75*  --  1.61*  CALCIUM 8.3* 8.1*  --  8.0*  MG  --   --  1.9 2.1  PHOS  --   --   --  4.0   GFR: Estimated Creatinine Clearance: 22 mL/min (A) (by C-G formula based on SCr of 1.61 mg/dL (H)). Recent Labs  Lab 12/30/2020 1214 01/11/2021 2009 12/27/2020 2335 2021/01/30 0200 January 30, 2021 0247 01/30/21 0909  PROCALCITON  --   --   --   --  0.12  --   WBC 6.5 7.6  --   --  12.6*  --   LATICACIDVEN  --  9.3* 8.1* 9.6*  --  >11.0*   Liver Function Tests: Recent Labs  Lab 01/09/2021 1214 01/10/2021 2009 January 30, 2021 0247  AST 197* 194* 197*  ALT 150* 140* 143*  ALKPHOS 101 92 90  BILITOT 2.2* 2.2* 2.2*  PROT 6.4* 6.1* 5.9*  ALBUMIN 3.3* 3.0* 3.0*   Recent Labs  Lab 30-Jan-2021 0247  LIPASE 33   No results for input(s): AMMONIA in the last 168 hours.  ABG:    Component Value Date/Time   PHART 7.267 (L) 12/02/2020 1929   PCO2ART 45.8 12/02/2020 1929    PO2ART 157 (H) 12/02/2020 1929   HCO3 20.8 12/02/2020 1929   TCO2 22 12/02/2020 1929   ACIDBASEDEF 6.0 (H) 12/02/2020 1929   O2SAT 99.0 12/02/2020 1929    Coagulation Profile: Recent Labs  Lab 01/12/2021 2335  INR 3.6*   Cardiac Enzymes: No results for input(s): CKTOTAL, CKMB, CKMBINDEX, TROPONINI in the last 168 hours.  HbA1C: Hgb A1c MFr Bld  Date/Time Value Ref Range Status  12/02/2020 11:00 AM 6.1 (H) 4.8 - 5.6 % Final    Comment:    (NOTE) Pre diabetes:          5.7%-6.4%  Diabetes:              >6.4%  Glycemic control for   <7.0% adults with diabetes    CBG: Recent Labs  Lab 01-30-2021 0244 2021/01/30 0915  GLUCAP 141* 91    Additional critical care time : 38 minutes   Eliseo Gum MSN, AGACNP-BC [place] for pager  01-30-21, 10:00 AM

## 2021-01-16 NOTE — H&P (Signed)
NAME:  Cynthia Frank, MRN:  [id number], DOB:  1935-06-26, LOS: 0 ADMISSION DATE:  12/21/2020 CONSULTATION DATE:  01/13/2021 REFERRING MD:  Almyra Free - EDP CHIEF COMPLAINT:  Weakness, nausea, abdominal pain  History of Present Illness:  85 year old woman who presented to [facility] ED via EMS 9/21 for acute onset of abdominal pain in the setting of persistent nausea and weakness. Found to be hypotensive with lactic acidosis, concern for shock. PMHx significant for HTN, HLD, Afib/flutter (on Eliquis, s/p DCCV 11/2020), CAD, HFrEF (LVEF 20-25% 08/2020), NICM, s/p AICD placement, T2DM, hypothyroidism, ovarian/colon CA (not currently on chemo).  Patient presented to her Cardiology clinic 9/21 for routine follow up/device interrogation; found to have Aflutter (since 12/07/2020) and mild volume overload on exam with plan for elective DCCV 9/27. Patient returned home but began to feel unwell with new onset of abdominal pain (8/10, central/periumbilical, dull and nonradiating) superimposed on chronic nausea and weakness. Denied fever/chills, CP/SOB, vomiting/diarrhea or changes in bowel habits. She contacted EMS. On EMS arrival, patient was given Zofran without relief. She was noted to be in a ventricular-paced rhythm with rate 60-110.   On arrival to ED, patient was afebrile with HR 60-110 and hypotension with SBPs 80s. Labs were notable for LA 9.3, normal WBC, Na 128 (baseline low 130s), K 4.9, Cr 1.75 (baseline 0.7-0.9), transaminitis (AST 194/ALT 140), troponin 27 (baseline 25). CT A/P was negative for acute intraabdominal pathology but demonstrated hepatic steatosis and moderate cardiomegaly as well as small bilateral pleural effusions (R > L). Abdominal US showed steatosis but was otherwise unremarkable. Peripheral NE and gentle IVF were started for hypotension, empiric antibiotics (vanc/Cefepime) initiated and cultures collected.  PCCM consulted for admission and management.  Pertinent Medical History:   Past  Medical History:  Diagnosis Date   AICD (automatic cardioverter/defibrillator) present    Arthritis    "left ankle" (07/14/2015)   Atrial fibrillation (HCC)    Cataract    bilateral -not a surgical candidate   CHF (congestive heart failure) ([place])    Colon cancer ([place]) 03/2006   T3, N0   Colon polyps 12/07/2010   Coronary artery disease    nonobstructive with 30% D1   Diabetes mellitus without complication ([place])    on meds   GERD (gastroesophageal reflux disease)    OTC PRN -diet controlled   History of ovarian cancer 1985   Hx: UTI (urinary tract infection)    Hyperlipidemia    on meds   Hypertension    on meds   Internal hemorrhoid    Nonischemic cardiomyopathy ([place])    last EF assessment 40% by MUGA   Osteoporosis    Prolia once per year   Partial bowel obstruction (HCC)    PVC (premature ventricular contraction)    S/P mitral valve clip implantation 07/23/2020   s/p TEER with a  MitraClip G4 NTW device positioned A2/P2 by Dr. Burt Knack   Thyroid disease    on meds   Significant Hospital Events: Including procedures, antibiotic start and stop dates in addition to other pertinent events   9/21 - Presented to [facility] ED via EMS post-Cardiology appointment with new onset abdominal pain and persistent nausea. Found to be hypotensive with elevated LA, c/f shock. Admitted to ICU.  Interim History / Subjective:  PCCM consulted for admission  Objective:  Blood pressure (!) 84/74, pulse 68, temperature 97.6 F (36.4 C), temperature source Oral, resp. rate (!) 21, height 5\' 4"  (1.626 m), weight 55 kg, SpO2 100 %.  Intake/Output Summary (Last 24 hours) at 02-05-21 0056 Last data filed at Feb 05, 2021 0000 Gross per 24 hour  Intake 1350 ml  Output --  Net 1350 ml   Filed Weights   01/03/2021 1953  Weight: 55 kg   Physical Examination: General: Chronically ill-appearing elderly woman in NAD. Very HOH. HEENT: [place]/AT, anicteric sclera, PERRL, dry mucous membranes. Neuro:  Awake, oriented x 4. Responds to verbal stimuli. Following commands consistently. Moves all 4 extremities spontaneously. Strength 4/5 in all 4 extremities. CV: RRR, no m/g/r. PULM: Breathing even and unlabored on RA. Lung fields diminished at bilateral bases R > L. GI: Soft, mildly TTP over central abdomen, nondistended. Hypooactive bowel sounds. Extremities: Trace bilateral LE edema noted. Chronic swelling of L ankle s/p fracture. Skin: Warm/dry, bilateral healing skin tears to anterior shins. Scattered ecchymosis to bilateral forearms.  Resolved Hospital Problem List:    Assessment & Plan:  Shock, presumed cardiogenic with component of hypovolemia Lactic acidosis Presented to [facility] ED via EMS after she returned home after her Cardiology appointment and felt unwell; reported new onset abdominal pain and persistent nausea. - Admit to ICU - Goal MAP > 65 - Fluid resuscitation - Peripheral NE to maintain goal MAP - Trend LA to normal  HFrEF - Echo 08/2020 LVEF 20-25%, global hypokinesis NICM Afib/Aflutter - on Eliquis CAD S/p AICD placement Extensive cardiac history with HFrEF, NICM, persistent Afib/Flutter with AICD placement, CAD. Seen by Cardiology 9/21 prior to admission for routine follow up/device interrogation and was in Aflutter. - Gentle fluid resuscitation in the setting of HF - Cardiac monitoring - Trend troponins - F/u BNP - Cardiology consult 9/22AM (patient of Dr. Aundra Dubin) - Repeat Echo - Continue home Eliquis - Amiodarone held in the setting of hypotension  Abdominal pain Nausea without vomiting Poor PO intake, at risk for malnutrition Acute onset of abdominal pain (reported 8/10) on day of admission. CT A/P negative for acute intraabdominal pathology. Patient does report persistent, intractable nausea ("dry heaves" without vomiting) since her hip surgery 8/17. This has contributed to poor PO intake. - Monitor clinically - Antiemetics PRN - D5NS @ 23mL/hr -  Nutrition consult for supplements; patient does not wish to have any type of feeding tube  AKI Likely prerenal in the setting of hypovolemia, shock. Baseline 0.7-0.9. - Trend BMP - Replete electrolytes as indicated - Monitor I&Os - F/u urine studies - Avoid nephrotoxic agents as able - Ensure adequate renal perfusion  HTN Hyperlipidemia - Hold home antihypertensives in the setting of hypotension - Resume home atorvastatin  T2DM On Farxiga at home. A1c 6.1 11/2020. - CBGs Q4H - SSI PRN  Hypothyroidism - Continue home levothyroxine  Physical deconditioning S/p L total hip arthroplasty 11/2020 History of ovarian and colon cancer Not currently on chemotherapy. S/p THA 11/2020 for L hip fracture. Since surgery has felt weak and nauseated leading to poor PO intake. - PT/OT consults as appropriate  Best Practice: (right click and "Reselect all SmartList Selections" daily)   Diet/type: clear liquids, ADAT DVT prophylaxis: DOAC, SCDs GI prophylaxis: N/A Lines: N/A Foley:  N/A Code Status:  DNR Last date of multidisciplinary goals of care discussion [Confirmed with patient at bedside 9/22 early AM that she wishes to be DNR status. Patient states "I do not want any life saving measures". Confirmed that she is ok with current pressors, etc but would not want CPR, intubation/mechanical ventilation or a permanent feeding tube.]  Labs:  CBC: Recent Labs  Lab 01/10/2021 1214 01/09/2021 2009  WBC  6.5 7.6  NEUTROABS  --  5.9  HGB 11.5* 11.5*  HCT 35.8* 36.7  MCV 92.3 95.8  PLT 155 937   Basic Metabolic Panel: Recent Labs  Lab 01/12/2021 1214 01/11/2021 2009  NA 126* 128*  K 2.9* 4.9  CL 90* 94*  CO2 24 14*  GLUCOSE 144* 134*  BUN 21 25*  CREATININE 1.38* 1.75*  CALCIUM 8.3* 8.1*   GFR: Estimated Creatinine Clearance: 21 mL/min (A) (by C-G formula based on SCr of 1.75 mg/dL (H)). Recent Labs  Lab 12/31/2020 1214 12/26/2020 2009  WBC 6.5 7.6  LATICACIDVEN  --  9.3*   Liver  Function Tests: Recent Labs  Lab 01/13/2021 1214 12/20/2020 2009  AST 197* 194*  ALT 150* 140*  ALKPHOS 101 92  BILITOT 2.2* 2.2*  PROT 6.4* 6.1*  ALBUMIN 3.3* 3.0*   No results for input(s): LIPASE, AMYLASE in the last 168 hours. No results for input(s): AMMONIA in the last 168 hours.  ABG:    Component Value Date/Time   PHART 7.267 (L) 12/02/2020 1929   PCO2ART 45.8 12/02/2020 1929   PO2ART 157 (H) 12/02/2020 1929   HCO3 20.8 12/02/2020 1929   TCO2 22 12/02/2020 1929   ACIDBASEDEF 6.0 (H) 12/02/2020 1929   O2SAT 99.0 12/02/2020 1929    Coagulation Profile: Recent Labs  Lab 12/25/2020 2335  INR 3.6*   Cardiac Enzymes: No results for input(s): CKTOTAL, CKMB, CKMBINDEX, TROPONINI in the last 168 hours.  HbA1C: Hgb A1c MFr Bld  Date/Time Value Ref Range Status  12/02/2020 11:00 AM 6.1 (H) 4.8 - 5.6 % Final    Comment:    (NOTE) Pre diabetes:          5.7%-6.4%  Diabetes:              >6.4%  Glycemic control for   <7.0% adults with diabetes    CBG: No results for input(s): GLUCAP in the last 168 hours.  Review of Systems:   Review of systems completed with pertinent positives/negatives outlined in above HPI.  Past Medical History:  She,  has a past medical history of AICD (automatic cardioverter/defibrillator) present, Arthritis, Atrial fibrillation ([place]), Cataract, CHF (congestive heart failure) ([place]), Colon cancer ([place]) (03/2006), Colon polyps (12/07/2010), Coronary artery disease, Diabetes mellitus without complication ([place]), GERD (gastroesophageal reflux disease), History of ovarian cancer (1985), UTI (urinary tract infection), Hyperlipidemia, Hypertension, Internal hemorrhoid, Nonischemic cardiomyopathy ([place]), Osteoporosis, Partial bowel obstruction ([place]), PVC (premature ventricular contraction), S/P mitral valve clip implantation (07/23/2020), and Thyroid disease.   Surgical History:   Past Surgical History:  Procedure Laterality Date   ABDOMINAL  HYSTERECTOMY  1979   ANKLE CLOSED REDUCTION  10/16/2011   Procedure: CLOSED REDUCTION ANKLE;  Surgeon: Tobi Bastos, MD;  Location: [facility];  Service: Orthopedics;  Laterality: Left;   ATRIAL FIBRILLATION ABLATION N/A 08/14/2019   Procedure: ATRIAL FIBRILLATION ABLATION;  Surgeon: Constance Haw, MD;  Location: [place] CV LAB;  Service: Cardiovascular;  Laterality: N/A;   ATRIAL FIBRILLATION ABLATION N/A 07/15/2020   Procedure: ATRIAL FIBRILLATION ABLATION;  Surgeon: Constance Haw, MD;  Location: [place] CV LAB;  Service: Cardiovascular;  Laterality: N/A;   CARDIOVERSION N/A 04/20/2020   Procedure: CARDIOVERSION;  Surgeon: Larey Dresser, MD;  Location: [facility] ENDOSCOPY;  Service: Cardiovascular;  Laterality: N/A;   CARDIOVERSION N/A 11/18/2020   Procedure: CARDIOVERSION;  Surgeon: Larey Dresser, MD;  Location: [facility] ENDOSCOPY;  Service: Cardiovascular;  Laterality: N/A;   COLECTOMY  2007   for  colon cancer   COLONOSCOPY N/A 03/11/2013   Procedure: COLONOSCOPY;  Surgeon: Ladene Artist, MD;  Location: [facility] ENDOSCOPY;  Service: Endoscopy;  Laterality: N/A;   COLONOSCOPY  2019   MS-plenvu(good)-int hems-SSA x 2   COLONOSCOPY WITH PROPOFOL N/A 04/21/2015   Procedure: COLONOSCOPY WITH PROPOFOL;  Surgeon: Ladene Artist, MD;  Location: [facility] END[facility];  Service: Endoscopy;  Laterality: N/A;   COLONOSCOPY WITH PROPOFOL N/A 02/05/2018   Procedure: COLONOSCOPY WITH PROPOFOL Hemostasis Clip;  Surgeon: Ladene Artist, MD;  Location: [facility] ENDOS[facility];  Service: Endoscopy;  Laterality: N/A;   EP IMPLANTABLE DEVICE N/A 07/14/2015   Procedure: BiV ICD Insertion CRT-D;  Surgeon: Evans Lance, MD;  Location: [place] CV LAB;  Service: Cardiovascular;  Laterality: N/A;   ESOPHAGOGASTRODUODENOSCOPY (EGD) WITH PROPOFOL N/A 04/21/2015   Procedure: ESOPHAGOGASTRODUODENOSCOPY (EGD) WITH PROPOFOL;  Surgeon: Ladene Artist, MD;  Location: [facility];  Service: Endoscopy;  Laterality: N/A;   FRACTURE  SURGERY     INSERTION OF ICD Left 07/14/2015   LAPAROTOMY  1980   "adhesions"   LAPAROTOMY  1989   laparotomy for takedown of intestinal obstruction secondary to adhesions    MITRAL VALVE REPAIR N/A 07/23/2020   Procedure: MITRAL VALVE REPAIR;  Surgeon: Sherren Mocha, MD;  Location: [place] CV LAB;  Service: Cardiovascular;  Laterality: N/A;   [place]   resection of right ovarian cancer   POLYPECTOMY  02/05/2018   Procedure: POLYPECTOMY;  Surgeon: Ladene Artist, MD;  Location: [facility] ENDOS[facility];  Service: Endoscopy;;   POLYPECTOMY  2018   SSA x 2   RIGHT HEART CATH N/A 07/22/2020   Procedure: RIGHT HEART CATH;  Surgeon: Larey Dresser, MD;  Location: [place] CV LAB;  Service: Cardiovascular;  Laterality: N/A;   SMALL INTESTINE SURGERY  1985   TEE WITHOUT CARDIOVERSION N/A 08/14/2019   Procedure: TRANSESOPHAGEAL ECHOCARDIOGRAM (TEE);  Surgeon: Constance Haw, MD;  Location: [place] [facility] LAB;  Service: Cardiovascular;  Laterality: N/A;   TEE WITHOUT CARDIOVERSION N/A 07/15/2020   Procedure: TRANSESOPHAGEAL ECHOCARDIOGRAM (TEE);  Surgeon: Constance Haw, MD;  Location: [place] CV LAB;  Service: Cardiovascular;  Laterality: N/A;   TEE WITHOUT CARDIOVERSION N/A 07/23/2020   Procedure: TRANSESOPHAGEAL ECHOCARDIOGRAM (TEE);  Surgeon: Sherren Mocha, MD;  Location: [place] CV LAB;  Service: Cardiovascular;  Laterality: N/A;   TONSILLECTOMY  1944   TOTAL HIP ARTHROPLASTY Right 12/18/2018   Procedure: TOTAL HIP ARTHROPLASTY ANTERIOR APPROACH;  Surgeon: Rod Can, MD;  Location: [facility];  Service: Orthopedics;  Laterality: Right;   TOTAL HIP ARTHROPLASTY Left 12/02/2020   Procedure: TOTAL HIP ARTHROPLASTY ANTERIOR APPROACH;  Surgeon: Paralee Cancel, MD;  Location: [facility];  Service: Orthopedics;  Laterality: Left;    Social History:   reports that she quit smoking about 42 years ago. Her smoking use included cigarettes. She has a 10.00  pack-year smoking history. She has never used smokeless tobacco. She reports current alcohol use of about 14.0 standard drinks per week. She reports that she does not use drugs.   Family History:  Her family history includes Heart attack in her father; Heart disease in her father; Prostate cancer in her father; Uterine cancer in her mother. There is no history of Colon cancer, Colon polyps, Esophageal cancer, Rectal cancer, or Stomach cancer.   Allergies: Allergies  Allergen Reactions   Clindamycin/Lincomycin Itching   Dramamine Ii [Meclizine Hcl] Other (See Comments)    Hyperactivity  Pneumococcal Vaccines Itching and Swelling    Redness    Sulfonamide Derivatives Itching and Swelling    Home Medications: Prior to Admission medications   Medication Sig Start Date End Date Taking? Authorizing Provider  amiodarone (PACERONE) 200 MG tablet Take 1 tablet (200 mg total) by mouth daily. 12/18/20   Larey Dresser, MD  amoxicillin (AMOXIL) 500 MG tablet Take 4 tablets (2,000 mg total) by mouth once as needed for up to 1 dose (take 1 hour before dental procedure). Patient not taking: Reported on 01/11/2021 10/05/20   Rafael Bihari, FNP  apixaban (ELIQUIS) 2.5 MG TABS tablet TAKE 1 TABLET(2.5 MG) BY MOUTH TWICE DAILY Patient taking differently: Take 2.5 mg by mouth 2 (two) times daily. 11/09/20   Camnitz, Will Hassell Done, MD  atorvastatin (LIPITOR) 40 MG tablet TAKE 1 TABLET(40 MG) BY MOUTH DAILY AT 6 PM 12/08/20   Camnitz, Ocie Doyne, MD  citalopram (CELEXA) 20 MG tablet Take 20 mg by mouth daily.    [provider]  Coenzyme Q10 (COQ10) 200 MG CAPS Take 200 mg by mouth daily.    [provider]  docusate sodium (COLACE) 100 MG capsule Take 100 mg by mouth daily.    [provider]  FARXIGA 10 MG TABS tablet TAKE 1 TABLET(10 MG) BY MOUTH DAILY BEFORE BREAKFAST Patient taking differently: Take 10 mg by mouth daily. 10/12/20   Larey Dresser, MD  furosemide (LASIX) 40  MG tablet Take 1 tablet (40 mg total) by mouth daily. 07/25/20   Clegg, Amy D, NP  HYDROcodone-acetaminophen (NORCO/VICODIN) 5-325 MG tablet Take 1-2 tablets by mouth every 6 (six) hours as needed for severe pain. 12/04/20   Irving Copas, PA-C  ipratropium (ATROVENT) 0.03 % nasal spray Place 1 spray into both nostrils 2 (two) times daily. 04/26/20   [provider]  levothyroxine (SYNTHROID) 25 MCG tablet TAKE 1 TABLET(25 MCG) BY MOUTH DAILY BEFORE BREAKFAST 10/28/20   Larey Dresser, MD  Magnesium Oxide 500 MG CAPS Take 1,000 mg by mouth 2 (two) times daily.     [provider]  methocarbamol (ROBAXIN) 500 MG tablet Take 1 tablet (500 mg total) by mouth every 6 (six) hours as needed for muscle spasms. 12/04/20   Irving Copas, PA-C  ondansetron (ZOFRAN) 4 MG tablet Take 1 tablet (4 mg total) by mouth every 6 (six) hours as needed for nausea or vomiting. 12/04/20   Irving Copas, PA-C  polyethylene glycol (MIRALAX / GLYCOLAX) 17 g packet Take 17 g by mouth daily as needed for mild constipation. 12/04/20   Irving Copas, PA-C  potassium chloride SA (KLOR-CON) 20 MEQ tablet Take 1 tablet (20 mEq total) by mouth daily. 11/12/20   Larey Dresser, MD  zolpidem (AMBIEN) 5 MG tablet Take 1 tablet (5 mg total) by mouth at bedtime as needed for sleep. 08/05/20   [name], MD     Critical care time: 45 minutes   Lestine Mount, [facility] February 05, 2021 12:56 AM  Please see [email or web address] for pager details.  From 7A-7P if no response, please call [phone] After hours, please call ELink [phone]

## 2021-01-16 NOTE — Death Summary Note (Signed)
DEATH SUMMARY   Patient Details  Name: Cynthia Frank MRN: [id number] DOB: 07/16/1935  Admission/Discharge Information   Admit Date:  January 18, 2021  Date of Death: Date of Death: 01-19-2021  Time of Death: Time of Death: 34  Length of Stay: 1  Referring Physician: [name], MD   Reason(s) for Hospitalization  Cardiogenic shock: acute decompensated heart failure   Diagnoses  Preliminary cause of death: cardiogenic shock (POA) Secondary Diagnoses (including complications and co-morbidities):  Decompensated heart failure (POA) Principal Problem:   Shock ([place]) Active Problems:   UTI (urinary tract infection)   Atrial fibrillation ([place])   Heart failure with reduced ejection fraction (HCC)   Hyponatremia   Hyperkalemia   Type 2 diabetes mellitus (HCC)   Hypothyroidism   AKI (acute kidney injury) ([place]) Nausea  Lactic acidosis  Transaminitis  DNR  Brief Hospital Course (including significant findings, care, treatment, and services provided and events leading to death)  Cynthia Frank is a 85 y.o. year old female with PMH HFrEF, Afib, recent Left hip fracture who presented to ED Jan 20, 2023 with progressive nausea and abdominal pain, without emesis. In the ED, patient was noted to be hypotensive with SBPs in the 80s. Labs were notable for LA 9.3, normal WBC, Na 128 (baseline low 130s), K 4.9, Cr 1.75 (baseline 0.7-0.9), transaminitis (AST 194/ALT 140), troponin 27 (baseline 25). CT A/P was negative for acute intraabdominal pathology but demonstrated hepatic steatosis and moderate cardiomegaly as well as small bilateral pleural effusions (R > L). Abdominal US showed steatosis but was otherwise unremarkable. Peripheral NE and gentle IVF were started for hypotension, empiric antibiotics (vanc/Cefepime) initiated and cultures collected.  On 2023/01/20 day, the patient had progressive NE requirement. Dobutamine was added and a PICC was placed. A Coox was obtained which resulted 41%.  The patient began to have increased respiratory effort and mild encephalopathy. Multidisciplinary goals of care discussions occurred with PCCM, Heart failure and the patient's family, and the decision was made to transition to comfort care upon arrival of the patient's daughter.  The patient was started on morphine infusion and passed comfortably with family at the bedside.    Pertinent Labs and Studies  Significant Diagnostic Studies DG Chest 2 View  Result Date: 01/18/21 CLINICAL DATA:  Chest pain EXAM: CHEST - 2 VIEW COMPARISON:  07/18/2020, CT from earlier in the same day. FINDINGS: Cardiac shadow is enlarged but stable. Defibrillator is again noted. Mitral clip is seen new from the prior exam. Lungs are well aerated bilaterally. Chronic appearing blunting of left costophrenic angle is noted likely related to scarring. Minimal blunting of the right costophrenic angle is seen. Mild associated atelectatic changes are noted. IMPRESSION: Small right pleural effusion with associated atelectatic changes. Chronic blunting of left costophrenic angle consistent with scarring. Electronically Signed   By: Cynthia Frank M.D.   On: 2021/01/18 23:08   CT Abdomen Pelvis W Contrast  Result Date: 2021/01/18 CLINICAL DATA:  Abdominal pain. EXAM: CT ABDOMEN AND PELVIS WITH CONTRAST TECHNIQUE: Multidetector CT imaging of the abdomen and pelvis was performed using the standard protocol following bolus administration of intravenous contrast. CONTRAST:  7mL OMNIPAQUE IOHEXOL 350 MG/ML SOLN COMPARISON:  CT abdomen pelvis dated 11/05/2015. FINDINGS: Lower chest: Partially visualized small bilateral pleural effusions with associated partial compressive atelectasis of the lower lobes. There is moderate cardiomegaly. Cardiac pacemaker wires. There is retrograde flow of contrast from the right atrium into the IVC consistent with right heart dysfunction. No intra-abdominal free air.  Small free  fluid in the pelvis.  Hepatobiliary: There is heterogeneous appearance of the liver, likely related to passive congestion. Correlation with LFTs recommended there is mild periportal edema. The gallbladder is predominantly contracted. High attenuating content within the gallbladder may represent sludge, small stones, or vicarious excretion of contrast. Pancreas: Unremarkable. No pancreatic ductal dilatation or surrounding inflammatory changes. Spleen: Normal in size without focal abnormality. Adrenals/Urinary Tract: The right adrenal gland is not visualized. The left adrenal gland is unremarkable. There is no hydronephrosis on either side. Areas of cortical scarring and parenchyma atrophy. The urinary bladder is poorly visualized due to streak artifact caused by bilateral hip arthroplasties. Stomach/Bowel: Postsurgical changes of bowel with anastomotic suture in the right lower quadrant. There is no bowel obstruction. Vascular/Lymphatic: Mild aortoiliac atherosclerotic disease. The IVC is unremarkable. No portal venous gas. There is no adenopathy. Reproductive: Hysterectomy Other: None Musculoskeletal: Bilateral total hip arthroplasties. Osteopenia with degenerative changes of the spine. No acute osseous pathology. IMPRESSION: 1. No acute intra-abdominal or pelvic pathology. No bowel obstruction. 2. Moderate cardiomegaly with evidence of right heart dysfunction. 3. Heterogeneous appearance of the liver, likely related to passive congestion secondary to heart failure. 4. Partially visualized small bilateral pleural effusions with associated partial compressive atelectasis of the lower lobes. 5. Aortic Atherosclerosis (ICD10-I70.0). Electronically Signed   By: Cynthia Frank M.D.   On: 12/23/2020 21:32   US Abdomen Limited  Result Date: 01/12/2021 CLINICAL DATA:  Elevated liver function tests. EXAM: ULTRASOUND ABDOMEN LIMITED RIGHT UPPER QUADRANT COMPARISON:  March 22, 2019 FINDINGS: Gallbladder: No gallstones or wall thickening  visualized (2.8 mm). No sonographic Murphy sign noted by sonographer. Common bile duct: Diameter: 1.7 mm Liver: No focal lesion identified. Diffusely increased echogenicity of the liver parenchyma is seen. Portal vein is patent on color Doppler imaging with normal direction of blood flow towards the liver. Other: Of incidental note is the presence of a right-sided pleural effusion. IMPRESSION: 1. Hepatic steatosis. 2. Right pleural effusion. Electronically Signed   By: Virgina Norfolk M.D.   On: 12/25/2020 23:41   DG Chest Port 1 View  Result Date: Feb 03, 2021 CLINICAL DATA:  PICC placement EXAM: PORTABLE CHEST 1 VIEW COMPARISON:  01/04/2021 FINDINGS: Interval placement of right upper extremity PICC, tip position near the superior cavoatrial junction. Cardiomegaly with left chest multi lead pacer defibrillator. Retrocardiac scarring and or atelectasis with a small pleural effusion and/or pleural thickening. No new or focal airspace opacity. IMPRESSION: 1. Interval placement of right upper extremity PICC, tip position near the superior cavoatrial junction. 2. Cardiomegaly. Retrocardiac scarring and or atelectasis with a small pleural effusion and/or pleural thickening. No new or focal airspace opacity. Electronically Signed   By: Eddie Candle M.D.   On: 2021/02/03 10:56   [place] EKG SITE RITE  Result Date: 02/03/21 If [facility] image not attached, placement could not be confirmed due to current cardiac rhythm.   Microbiology Recent Results (from the past 240 hour(s))  Resp Panel by RT-PCR (Flu A&B, Covid) Nasopharyngeal Swab     Status: None   Collection Time: 12/21/2020  8:09 PM   Specimen: Nasopharyngeal Swab; Nasopharyngeal(NP) swabs in vial transport medium  Result Value Ref Range Status   SARS Coronavirus 2 by RT PCR NEGATIVE NEGATIVE Final    Comment: (NOTE) SARS-CoV-2 target nucleic acids are NOT DETECTED.  The SARS-CoV-2 RNA is generally detectable in upper respiratory specimens during the  acute phase of infection. The lowest concentration of SARS-CoV-2 viral copies this assay can detect is 138 copies/mL. A  negative result does not preclude SARS-Cov-2 infection and should not be used as the sole basis for treatment or other patient management decisions. A negative result may occur with  improper specimen collection/handling, submission of specimen other than nasopharyngeal swab, presence of viral mutation(s) within the areas targeted by this assay, and inadequate number of viral copies(<138 copies/mL). A negative result must be combined with clinical observations, patient history, and epidemiological information. The expected result is Negative.  Fact Sheet for Patients:  EntrepreneurPulse.com.au  Fact Sheet for Healthcare Providers:  IncredibleEmployment.be  This test is no t yet approved or cleared by the [place] FDA and  has been authorized for detection and/or diagnosis of SARS-CoV-2 by FDA under an Emergency Use Authorization (EUA). This EUA will remain  in effect (meaning this test can be used) for the duration of the COVID-19 declaration under Section 564(b)(1) of the Act, 21 U.S.C.section 360bbb-3(b)(1), unless the authorization is terminated  or revoked sooner.       Influenza A by PCR NEGATIVE NEGATIVE Final   Influenza B by PCR NEGATIVE NEGATIVE Final    Comment: (NOTE) The Xpert Xpress SARS-CoV-2/FLU/RSV plus assay is intended as an aid in the diagnosis of influenza from Nasopharyngeal swab specimens and should not be used as a sole basis for treatment. Nasal washings and aspirates are unacceptable for Xpert Xpress SARS-CoV-2/FLU/RSV testing.  Fact Sheet for Patients: EntrepreneurPulse.com.au  Fact Sheet for Healthcare Providers: IncredibleEmployment.be  This test is not yet approved or cleared by the [place] FDA and has been authorized for detection and/or  diagnosis of SARS-CoV-2 by FDA under an Emergency Use Authorization (EUA). This EUA will remain in effect (meaning this test can be used) for the duration of the COVID-19 declaration under Section 564(b)(1) of the Act, 21 U.S.C. section 360bbb-3(b)(1), unless the authorization is terminated or revoked.  Performed at [facility], [place] [address] [place], [place] [address]   Culture, blood (routine x 2)     Status: None (Preliminary result)   Collection Time: 12/28/2020 11:50 PM   Specimen: BLOOD  Result Value Ref Range Status   Specimen Description BLOOD LEFT ANTECUBITAL  Final   Special Requests   Final    BOTTLES DRAWN AEROBIC AND ANAEROBIC Blood Culture adequate volume   Culture   Final    NO GROWTH < 12 HOURS Performed at [facility], [place] [address] [place], [place] [address]    Report Status PENDING  Incomplete  Culture, blood (routine x 2)     Status: None (Preliminary result)   Collection Time: 12/23/2020 11:55 PM   Specimen: BLOOD  Result Value Ref Range Status   Specimen Description BLOOD RIGHT ANTECUBITAL  Final   Special Requests   Final    BOTTLES DRAWN AEROBIC AND ANAEROBIC Blood Culture adequate volume   Culture   Final    NO GROWTH < 12 HOURS Performed at [facility], [place] [address], [place], [place] [address]    Report Status PENDING  Incomplete  MRSA Next Gen by PCR, Nasal     Status: None   Collection Time: 01-09-21  3:09 AM   Specimen: Nasal Mucosa; Nasal Swab  Result Value Ref Range Status   MRSA by PCR Next Gen NOT DETECTED NOT DETECTED Final    Comment: (NOTE) The GeneXpert MRSA Assay (FDA approved for NASAL specimens only), is one component of a comprehensive MRSA colonization surveillance program. It is not intended to diagnose MRSA infection nor to guide  or monitor treatment for MRSA infections. Test performance is not FDA approved in patients less than 53 years old. Performed at [facility], [place] [address],  [place], [place] [address]     Lab Basic Metabolic Panel: Recent Labs  Lab 12/20/2020 1214 01/05/2021 2009 01/08/2021 2335 01-09-2021 0247  NA 126* 128*  --  129*  K 2.9* 4.9  --  4.6  CL 90* 94*  --  97*  CO2 24 14*  --  9*  GLUCOSE 144* 134*  --  113*  BUN 21 25*  --  25*  CREATININE 1.38* 1.75*  --  1.61*  CALCIUM 8.3* 8.1*  --  8.0*  MG  --   --  1.9 2.1  PHOS  --   --   --  4.0   Liver Function Tests: Recent Labs  Lab 01/11/2021 1214 01/05/2021 2009 2021/01/09 0247  AST 197* 194* 197*  ALT 150* 140* 143*  ALKPHOS 101 92 90  BILITOT 2.2* 2.2* 2.2*  PROT 6.4* 6.1* 5.9*  ALBUMIN 3.3* 3.0* 3.0*   Recent Labs  Lab 01-09-21 0247  LIPASE 33   No results for input(s): AMMONIA in the last 168 hours. CBC: Recent Labs  Lab 12/22/2020 1214 01/10/2021 2009 January 09, 2021 0247  WBC 6.5 7.6 12.6*  NEUTROABS  --  5.9  --   HGB 11.5* 11.5* 12.5  HCT 35.8* 36.7 40.0  MCV 92.3 95.8 96.6  PLT 155 157 132*   Cardiac Enzymes: No results for input(s): CKTOTAL, CKMB, CKMBINDEX, TROPONINI in the last 168 hours. Sepsis Labs: Recent Labs  Lab 01/02/2021 1214 01/11/2021 2009 12/19/2020 2335 01-09-21 0200 01/09/2021 0247 2021/01/09 0909  PROCALCITON  --   --   --   --  0.12  --   WBC 6.5 7.6  --   --  12.6*  --   LATICACIDVEN  --  9.3* 8.1* 9.6*  --  >11.0*    Procedures/Operations   9/22 PICC    Eliseo Gum MSN, AGACNP-BC [place] for pager  01/09/2021, 4:59 PM

## 2021-01-16 NOTE — Progress Notes (Signed)
PCCM Brief Interval Note   85 yo F acute on chronic heart failure -- decompensated, with cardiogenic shock and multisystem organ failure.   Over the course of the day, pts pressor requirement has tripled. Respiratory status and mentation are declining. DNR/I on admission.   I met with pts husband at bedside. He recently spoke with Adv HF team and understands the patient is actively dying. The hope is for the arrival of his daughter to bedside (currently in [place]) prior to pt passing. I transparently shared that this may not be feasible given the patient's ongoing decline even with continuation of pressors. We discussed her worsening respiratory status -- I recommended addition of morphine for management of respiratory symptoms, and did discuss that this will likely cause her BP to further decrease.   Family is agreeable to management of sx with morphine -- we will start a morphing gtt + PRNs for comfort. Continue NE and dobutamine for now. Quite possible pt will die prior to daughter's arrival, however if daughter does arrive prior to passing the patient will then fully transition to comfort care & pressors will be discontinued.   Emotional support provided, chaplain consult placed.    Additional critical care time: 17 minutes   Grace Bowser MSN, AGACNP-BC [facility] for pager  01/15/2021, 12:09 PM  

## 2021-01-16 NOTE — ED Notes (Signed)
I introduced myself to pt. Pt AxO x4, but extremely hard of hearing. GCS 15. Pt here with weakness/nausea x1 week. Pt says she doesn't need to urinate. Denies pain, just generally doesn't feel well. 2nd IV started. Antibx and IVF given. Norepi started. Pt denies further needs.

## 2021-01-16 NOTE — ED Notes (Signed)
Report given to Will, RN  Pt transported to floor via stretcher on monitor via RN with IVF and levophed continued.

## 2021-01-16 NOTE — ED Notes (Signed)
I collected first set of blood cultures at this time (prior to starting antibx).

## 2021-01-16 NOTE — Progress Notes (Signed)
Expiration Note:  Expired at 1345 with family at bedside.   No heart sounds and respirations heard as auscultated x1 full minute with Canary Brim.    Drs. Ina Homes and [name] notified of death.   Belongings sent home with patients family. [place] notified.

## 2021-01-16 NOTE — Progress Notes (Signed)
Peripherally Inserted Central Catheter Placement  The IV Nurse has discussed with the patient and/or persons authorized to consent for the patient, the purpose of this procedure and the potential benefits and risks involved with this procedure.  The benefits include less needle sticks, lab draws from the catheter, and the patient may be discharged home with the catheter. Risks include, but not limited to, infection, bleeding, blood clot (thrombus formation), and puncture of an artery; nerve damage and irregular heartbeat and possibility to perform a PICC exchange if needed/ordered by physician.  Alternatives to this procedure were also discussed.  B[facility] Power PICC patient education guide, fact sheet on infection prevention and patient information card has been provided to patient /or left at bedside.    PICC Placement Documentation  PICC Triple Lumen 01-08-2021 PICC Right Brachial 36 cm 0 cm (Active)  Indication for Insertion or Continuance of Line Vasoactive infusions 2021-01-08 1000  Exposed Catheter (cm) 0 cm January 08, 2021 1000  Site Assessment Clean;Dry;Intact 01-08-21 1000  Lumen #1 Status Flushed;Saline locked;Blood return noted 2021-01-08 1000  Lumen #2 Status Flushed;Saline locked;Blood return noted 01/08/21 1000  Lumen #3 Status Flushed;Saline locked;Blood return noted 01-08-2021 1000  Dressing Type Transparent;Securing device January 08, 2021 1000  Dressing Status Clean;Dry;Intact 2021/01/08 1000  Antimicrobial disc in place? Yes 01-08-2021 1000  Line Care Connections checked and tightened 08-Jan-2021 1000  Dressing Intervention New dressing 2021-01-08 1000  Dressing Change Due 01/14/21 2021-01-08 1000       Darlyn Read 01/08/2021, 10:31 AM

## 2021-01-16 NOTE — Progress Notes (Addendum)
[facility] Progress Note Patient Name: Cynthia Frank DOB: 1935/05/06 MRN: [id number]   Date of Service  01/28/2021  HPI/Events of Note  Multiple issues: 1. Lactic Acid - 9.3 --> 8.1 --> 9.6. Doubt that she can tolerate fluid with LVEF = 20% and BNP =2605. Hgb = 12.5. BP = 85/70 and HR = 62. 2. Nausea - QTc interval = 0.5 seconds.  eICU Interventions  Plan: Dobutamine 2.5 mcg/kg/min. Repeat Lactic Acid at 9 AM. Compazine 10 mg IV Q 6 hours PRN N/V.     Intervention Category Major Interventions: Acid-Base disturbance - evaluation and management;Other:  Lysle Dingwall Jan 28, 2021, 4:53 AM

## 2021-01-16 NOTE — Progress Notes (Signed)
Morphine gtt 50mls IV wasted in [facility] w/Kristyn Dillon,RN

## 2021-01-16 NOTE — Progress Notes (Signed)
Sepsis tracking by eLINK 

## 2021-01-16 DEATH — deceased

## 2021-02-02 ENCOUNTER — Encounter (HOSPITAL_COMMUNITY): Payer: Medicare Other
